# Patient Record
Sex: Female | Born: 1960 | Race: Black or African American | Hispanic: No
Health system: Midwestern US, Community
[De-identification: ages and names within clinical notes are randomized; demographics above are authoritative.]

## PROBLEM LIST (undated history)

## (undated) DIAGNOSIS — Z8719 Personal history of other diseases of the digestive system: Secondary | ICD-10-CM

## (undated) DIAGNOSIS — I50810 Right heart failure, unspecified: Secondary | ICD-10-CM

## (undated) DIAGNOSIS — J849 Interstitial pulmonary disease, unspecified: Secondary | ICD-10-CM

## (undated) DIAGNOSIS — D649 Anemia, unspecified: Secondary | ICD-10-CM

## (undated) DIAGNOSIS — I4581 Long QT syndrome: Secondary | ICD-10-CM

## (undated) DIAGNOSIS — K219 Gastro-esophageal reflux disease without esophagitis: Secondary | ICD-10-CM

## (undated) DIAGNOSIS — G4710 Hypersomnia, unspecified: Secondary | ICD-10-CM

## (undated) DIAGNOSIS — M349 Systemic sclerosis, unspecified: Secondary | ICD-10-CM

## (undated) DIAGNOSIS — I10 Essential (primary) hypertension: Secondary | ICD-10-CM

## (undated) DIAGNOSIS — E669 Obesity, unspecified: Secondary | ICD-10-CM

## (undated) DIAGNOSIS — I2721 Secondary pulmonary arterial hypertension: Secondary | ICD-10-CM

## (undated) DIAGNOSIS — E079 Disorder of thyroid, unspecified: Secondary | ICD-10-CM

## (undated) DIAGNOSIS — J45909 Unspecified asthma, uncomplicated: Secondary | ICD-10-CM

## (undated) DIAGNOSIS — N809 Endometriosis, unspecified: Secondary | ICD-10-CM

## (undated) DIAGNOSIS — R42 Dizziness and giddiness: Secondary | ICD-10-CM

## (undated) DIAGNOSIS — N920 Excessive and frequent menstruation with regular cycle: Secondary | ICD-10-CM

## (undated) DIAGNOSIS — R072 Precordial pain: Secondary | ICD-10-CM

## (undated) DIAGNOSIS — G4730 Sleep apnea, unspecified: Secondary | ICD-10-CM

## (undated) DIAGNOSIS — E119 Type 2 diabetes mellitus without complications: Secondary | ICD-10-CM

## (undated) DIAGNOSIS — E0580 Other thyrotoxicosis without thyrotoxic crisis or storm: Secondary | ICD-10-CM

## (undated) DIAGNOSIS — J42 Unspecified chronic bronchitis: Secondary | ICD-10-CM

## (undated) DIAGNOSIS — Q2112 Patent foramen ovale: Secondary | ICD-10-CM

## (undated) DIAGNOSIS — Z9289 Personal history of other medical treatment: Secondary | ICD-10-CM

## (undated) DIAGNOSIS — J189 Pneumonia, unspecified organism: Secondary | ICD-10-CM

## (undated) DIAGNOSIS — I959 Hypotension, unspecified: Secondary | ICD-10-CM

## (undated) DIAGNOSIS — D259 Leiomyoma of uterus, unspecified: Secondary | ICD-10-CM

## (undated) DIAGNOSIS — H9319 Tinnitus, unspecified ear: Secondary | ICD-10-CM

## (undated) DIAGNOSIS — Z862 Personal history of diseases of the blood and blood-forming organs and certain disorders involving the immune mechanism: Secondary | ICD-10-CM

## (undated) DIAGNOSIS — M359 Systemic involvement of connective tissue, unspecified: Secondary | ICD-10-CM

## (undated) DIAGNOSIS — M1990 Unspecified osteoarthritis, unspecified site: Secondary | ICD-10-CM

## (undated) DIAGNOSIS — T7840XA Allergy, unspecified, initial encounter: Secondary | ICD-10-CM

## (undated) DIAGNOSIS — R6510 Systemic inflammatory response syndrome (SIRS) of non-infectious origin without acute organ dysfunction: Secondary | ICD-10-CM

## (undated) DIAGNOSIS — Q211 Atrial septal defect: Secondary | ICD-10-CM

## (undated) DIAGNOSIS — D126 Benign neoplasm of colon, unspecified: Secondary | ICD-10-CM

## (undated) DIAGNOSIS — M329 Systemic lupus erythematosus, unspecified: Secondary | ICD-10-CM

## (undated) DIAGNOSIS — I509 Heart failure, unspecified: Secondary | ICD-10-CM

## (undated) HISTORY — DX: Essential (primary) hypertension: I10

## (undated) HISTORY — DX: Long QT syndrome: I45.81

## (undated) HISTORY — PX: DILATION AND CURETTAGE OF UTERUS: SHX78

## (undated) HISTORY — DX: Systemic involvement of connective tissue, unspecified: M35.9

## (undated) HISTORY — DX: Excessive and frequent menstruation with regular cycle: N92.0

## (undated) HISTORY — DX: Unspecified osteoarthritis, unspecified site: M19.90

## (undated) HISTORY — DX: Allergy, unspecified, initial encounter: T78.40XA

## (undated) HISTORY — DX: Leiomyoma of uterus, unspecified: D25.9

## (undated) HISTORY — DX: Obesity, unspecified: E66.9

## (undated) HISTORY — PX: REDUCTION MAMMAPLASTY: SUR839

## (undated) HISTORY — DX: Precordial pain: R07.2

## (undated) HISTORY — DX: Systemic inflammatory response syndrome (sirs) of non-infectious origin without acute organ dysfunction: R65.10

## (undated) HISTORY — DX: Disorder of thyroid, unspecified: E07.9

## (undated) HISTORY — DX: Personal history of diseases of the blood and blood-forming organs and certain disorders involving the immune mechanism: Z86.2

## (undated) HISTORY — DX: Sleep apnea, unspecified: G47.10

## (undated) HISTORY — DX: Heart failure, unspecified: I50.9

## (undated) HISTORY — DX: Endometriosis, unspecified: N80.9

## (undated) HISTORY — DX: Unspecified chronic bronchitis: J42

## (undated) HISTORY — DX: Hypersomnia, unspecified: G47.10

## (undated) HISTORY — DX: Systemic lupus erythematosus, unspecified: M32.9

## (undated) HISTORY — DX: Personal history of other medical treatment: Z92.89

## (undated) HISTORY — DX: Hypotension, unspecified: I95.9

## (undated) HISTORY — DX: Other thyrotoxicosis without thyrotoxic crisis or storm: E05.80

## (undated) HISTORY — DX: Benign neoplasm of colon, unspecified: D12.6

## (undated) HISTORY — DX: Sleep apnea, unspecified: G47.30

---

## 1999-02-28 ENCOUNTER — Other Ambulatory Visit: Admission: RE | Admit: 1999-02-28 | Discharge: 1999-02-28 | Payer: Self-pay | Admitting: Obstetrics and Gynecology

## 2000-02-27 ENCOUNTER — Other Ambulatory Visit: Admission: RE | Admit: 2000-02-27 | Discharge: 2000-02-27 | Payer: Self-pay | Admitting: Obstetrics and Gynecology

## 2001-09-02 ENCOUNTER — Other Ambulatory Visit: Admission: RE | Admit: 2001-09-02 | Discharge: 2001-09-02 | Payer: Self-pay | Admitting: *Deleted

## 2002-02-15 ENCOUNTER — Encounter: Payer: Self-pay | Admitting: Emergency Medicine

## 2002-02-15 ENCOUNTER — Emergency Department (HOSPITAL_COMMUNITY): Admission: EM | Admit: 2002-02-15 | Discharge: 2002-02-15 | Payer: Self-pay | Admitting: Emergency Medicine

## 2002-08-13 ENCOUNTER — Encounter: Payer: Self-pay | Admitting: Family Medicine

## 2002-08-13 HISTORY — PX: ABDOMINAL HYSTERECTOMY: SHX81

## 2002-08-13 LAB — CONVERTED CEMR LAB: Pap Smear: NORMAL

## 2002-09-24 ENCOUNTER — Other Ambulatory Visit: Admission: RE | Admit: 2002-09-24 | Discharge: 2002-09-24 | Payer: Self-pay | Admitting: Obstetrics and Gynecology

## 2003-06-03 ENCOUNTER — Encounter: Payer: Self-pay | Admitting: Obstetrics and Gynecology

## 2003-06-07 ENCOUNTER — Inpatient Hospital Stay (HOSPITAL_COMMUNITY): Admission: RE | Admit: 2003-06-07 | Discharge: 2003-06-10 | Payer: Self-pay | Admitting: Obstetrics and Gynecology

## 2003-06-07 ENCOUNTER — Encounter (INDEPENDENT_AMBULATORY_CARE_PROVIDER_SITE_OTHER): Payer: Self-pay | Admitting: *Deleted

## 2003-08-14 HISTORY — PX: BREAST SURGERY: SHX581

## 2003-10-18 ENCOUNTER — Other Ambulatory Visit: Admission: RE | Admit: 2003-10-18 | Discharge: 2003-10-18 | Payer: Self-pay | Admitting: Obstetrics and Gynecology

## 2004-04-09 ENCOUNTER — Emergency Department (HOSPITAL_COMMUNITY): Admission: EM | Admit: 2004-04-09 | Discharge: 2004-04-09 | Payer: Self-pay | Admitting: Emergency Medicine

## 2004-04-10 ENCOUNTER — Ambulatory Visit (HOSPITAL_BASED_OUTPATIENT_CLINIC_OR_DEPARTMENT_OTHER): Admission: RE | Admit: 2004-04-10 | Discharge: 2004-04-10 | Payer: Self-pay | Admitting: Specialist

## 2004-04-10 ENCOUNTER — Ambulatory Visit: Admission: RE | Admit: 2004-04-10 | Discharge: 2004-04-10 | Payer: Self-pay | Admitting: Specialist

## 2004-06-06 ENCOUNTER — Ambulatory Visit (HOSPITAL_COMMUNITY): Admission: RE | Admit: 2004-06-06 | Discharge: 2004-06-06 | Payer: Self-pay | Admitting: Specialist

## 2004-06-19 ENCOUNTER — Ambulatory Visit (HOSPITAL_COMMUNITY): Admission: RE | Admit: 2004-06-19 | Discharge: 2004-06-19 | Payer: Self-pay | Admitting: Specialist

## 2004-06-19 ENCOUNTER — Encounter (INDEPENDENT_AMBULATORY_CARE_PROVIDER_SITE_OTHER): Payer: Self-pay | Admitting: *Deleted

## 2004-06-19 ENCOUNTER — Ambulatory Visit (HOSPITAL_BASED_OUTPATIENT_CLINIC_OR_DEPARTMENT_OTHER): Admission: RE | Admit: 2004-06-19 | Discharge: 2004-06-19 | Payer: Self-pay | Admitting: Specialist

## 2004-07-04 ENCOUNTER — Emergency Department (HOSPITAL_COMMUNITY): Admission: EM | Admit: 2004-07-04 | Discharge: 2004-07-04 | Payer: Self-pay | Admitting: Emergency Medicine

## 2004-07-13 ENCOUNTER — Ambulatory Visit: Payer: Self-pay | Admitting: Family Medicine

## 2004-08-16 ENCOUNTER — Ambulatory Visit: Payer: Self-pay | Admitting: Family Medicine

## 2005-03-14 ENCOUNTER — Ambulatory Visit: Payer: Self-pay | Admitting: Family Medicine

## 2005-03-20 ENCOUNTER — Ambulatory Visit (HOSPITAL_COMMUNITY): Admission: RE | Admit: 2005-03-20 | Discharge: 2005-03-20 | Payer: Self-pay | Admitting: Family Medicine

## 2005-05-21 ENCOUNTER — Ambulatory Visit: Payer: Self-pay | Admitting: Family Medicine

## 2005-06-25 ENCOUNTER — Ambulatory Visit: Payer: Self-pay | Admitting: Family Medicine

## 2005-07-18 ENCOUNTER — Encounter (HOSPITAL_COMMUNITY): Admission: RE | Admit: 2005-07-18 | Discharge: 2005-07-18 | Payer: Self-pay | Admitting: Oncology

## 2005-07-18 ENCOUNTER — Encounter: Admission: RE | Admit: 2005-07-18 | Discharge: 2005-07-18 | Payer: Self-pay | Admitting: Oncology

## 2005-07-18 ENCOUNTER — Ambulatory Visit (HOSPITAL_COMMUNITY): Payer: Self-pay | Admitting: Oncology

## 2005-11-22 ENCOUNTER — Other Ambulatory Visit: Admission: RE | Admit: 2005-11-22 | Discharge: 2005-11-22 | Payer: Self-pay | Admitting: Obstetrics and Gynecology

## 2005-12-18 ENCOUNTER — Ambulatory Visit: Payer: Self-pay | Admitting: Family Medicine

## 2006-01-25 ENCOUNTER — Ambulatory Visit: Payer: Self-pay | Admitting: Family Medicine

## 2006-03-20 ENCOUNTER — Ambulatory Visit: Payer: Self-pay | Admitting: Family Medicine

## 2006-06-05 ENCOUNTER — Other Ambulatory Visit: Admission: RE | Admit: 2006-06-05 | Discharge: 2006-06-05 | Payer: Self-pay | Admitting: Obstetrics and Gynecology

## 2006-07-11 ENCOUNTER — Ambulatory Visit: Payer: Self-pay | Admitting: Family Medicine

## 2006-07-11 LAB — CONVERTED CEMR LAB: Blood Glucose, Fasting: 95 mg/dL

## 2006-09-26 ENCOUNTER — Ambulatory Visit (HOSPITAL_COMMUNITY): Admission: RE | Admit: 2006-09-26 | Discharge: 2006-09-26 | Payer: Self-pay | Admitting: Family Medicine

## 2006-09-26 ENCOUNTER — Ambulatory Visit: Payer: Self-pay | Admitting: Family Medicine

## 2006-09-30 ENCOUNTER — Emergency Department (HOSPITAL_COMMUNITY): Admission: EM | Admit: 2006-09-30 | Discharge: 2006-10-01 | Payer: Self-pay | Admitting: Emergency Medicine

## 2006-10-22 ENCOUNTER — Ambulatory Visit: Payer: Self-pay | Admitting: Family Medicine

## 2007-03-03 ENCOUNTER — Encounter: Payer: Self-pay | Admitting: Family Medicine

## 2007-03-03 LAB — CONVERTED CEMR LAB
BUN: 15 mg/dL (ref 6–23)
Blood Glucose, Fasting: 115 mg/dL
Calcium: 8.5 mg/dL (ref 8.4–10.5)
Chloride: 100 meq/L (ref 96–112)
Hgb A1c MFr Bld: 6.4 %
Hgb A1c MFr Bld: 6.4 % — ABNORMAL HIGH (ref 4.6–6.1)

## 2007-03-19 ENCOUNTER — Ambulatory Visit: Payer: Self-pay | Admitting: Family Medicine

## 2007-03-25 ENCOUNTER — Ambulatory Visit (HOSPITAL_COMMUNITY): Admission: RE | Admit: 2007-03-25 | Discharge: 2007-03-25 | Payer: Self-pay | Admitting: Family Medicine

## 2007-03-26 ENCOUNTER — Ambulatory Visit (HOSPITAL_COMMUNITY): Admission: RE | Admit: 2007-03-26 | Discharge: 2007-03-26 | Payer: Self-pay | Admitting: Family Medicine

## 2007-07-01 ENCOUNTER — Other Ambulatory Visit: Admission: RE | Admit: 2007-07-01 | Discharge: 2007-07-01 | Payer: Self-pay | Admitting: Obstetrics and Gynecology

## 2007-08-27 ENCOUNTER — Encounter: Payer: Self-pay | Admitting: Family Medicine

## 2007-08-27 DIAGNOSIS — I10 Essential (primary) hypertension: Secondary | ICD-10-CM | POA: Insufficient documentation

## 2007-08-27 DIAGNOSIS — E039 Hypothyroidism, unspecified: Secondary | ICD-10-CM | POA: Insufficient documentation

## 2007-08-27 DIAGNOSIS — E669 Obesity, unspecified: Secondary | ICD-10-CM | POA: Insufficient documentation

## 2007-08-27 DIAGNOSIS — E1169 Type 2 diabetes mellitus with other specified complication: Secondary | ICD-10-CM | POA: Insufficient documentation

## 2007-10-16 ENCOUNTER — Ambulatory Visit: Payer: Self-pay | Admitting: Family Medicine

## 2007-10-16 LAB — CONVERTED CEMR LAB
Basophils Relative: 0 % (ref 0–1)
Chloride: 101 meq/L (ref 96–112)
HCT: 34 % — ABNORMAL LOW (ref 36.0–46.0)
HDL: 37 mg/dL — ABNORMAL LOW (ref >39)
Hemoglobin: 10.7 g/dL — ABNORMAL LOW (ref 12.0–15.0)
Lymphocytes Relative: 34 % (ref 12–46)
Lymphs Abs: 3.8 10*3/uL (ref 0.7–4.0)
Monocytes Relative: 6 % (ref 3–12)
Neutro Abs: 6.6 10*3/uL (ref 1.7–7.7)
Platelets: 396 10*3/uL (ref 150–400)
Potassium: 4 meq/L (ref 3.5–5.3)
Total CHOL/HDL Ratio: 3.9
WBC: 11.4 10*3/uL — ABNORMAL HIGH (ref 4.0–10.5)

## 2008-03-05 DIAGNOSIS — Z6841 Body Mass Index (BMI) 40.0 and over, adult: Secondary | ICD-10-CM

## 2008-04-13 ENCOUNTER — Ambulatory Visit (HOSPITAL_COMMUNITY): Admission: RE | Admit: 2008-04-13 | Discharge: 2008-04-13 | Payer: Self-pay | Admitting: Family Medicine

## 2008-04-23 ENCOUNTER — Ambulatory Visit: Payer: Self-pay | Admitting: Family Medicine

## 2008-05-05 ENCOUNTER — Telehealth: Payer: Self-pay | Admitting: Family Medicine

## 2008-05-31 ENCOUNTER — Ambulatory Visit: Payer: Self-pay | Admitting: Family Medicine

## 2008-05-31 DIAGNOSIS — J209 Acute bronchitis, unspecified: Secondary | ICD-10-CM | POA: Insufficient documentation

## 2008-05-31 LAB — CONVERTED CEMR LAB: Glucose, Bld: 141 mg/dL

## 2008-07-20 ENCOUNTER — Other Ambulatory Visit: Admission: RE | Admit: 2008-07-20 | Discharge: 2008-07-20 | Payer: Self-pay | Admitting: Obstetrics and Gynecology

## 2008-08-19 ENCOUNTER — Telehealth: Payer: Self-pay | Admitting: Family Medicine

## 2008-10-06 ENCOUNTER — Ambulatory Visit: Payer: Self-pay | Admitting: Family Medicine

## 2008-10-10 DIAGNOSIS — E559 Vitamin D deficiency, unspecified: Secondary | ICD-10-CM | POA: Insufficient documentation

## 2008-10-15 ENCOUNTER — Encounter: Payer: Self-pay | Admitting: Family Medicine

## 2008-10-18 ENCOUNTER — Ambulatory Visit: Payer: Self-pay | Admitting: Endocrinology

## 2008-10-18 LAB — CONVERTED CEMR LAB
Calcium, Total (PTH): 8.4 mg/dL (ref 8.4–10.5)
Vit D, 25-Hydroxy: 26 ng/mL — ABNORMAL LOW (ref 30–89)

## 2008-10-21 ENCOUNTER — Encounter: Payer: Self-pay | Admitting: Family Medicine

## 2008-10-21 LAB — CONVERTED CEMR LAB
BUN: 23 mg/dL (ref 6–23)
CO2: 26 meq/L (ref 19–32)
Cholesterol: 138 mg/dL (ref 0–200)
Creatinine, Ser: 1.09 mg/dL (ref 0.40–1.20)
Eosinophils Absolute: 0.3 10*3/uL (ref 0.0–0.7)
Glucose, Bld: 88 mg/dL (ref 70–99)
HCT: 34.3 % — ABNORMAL LOW (ref 36.0–46.0)
LDL Cholesterol: 85 mg/dL (ref 0–99)
MCV: 84.9 fL (ref 78.0–100.0)
Monocytes Absolute: 0.6 10*3/uL (ref 0.1–1.0)
Monocytes Relative: 6 % (ref 3–12)
Neutro Abs: 5.8 10*3/uL (ref 1.7–7.7)
RBC: 4.04 M/uL (ref 3.87–5.11)
Sodium: 141 meq/L (ref 135–145)
TSH: 3.035 microintl units/mL (ref 0.350–4.500)
Triglycerides: 89 mg/dL (ref <150)
VLDL: 18 mg/dL (ref 0–40)
WBC: 9.7 10*3/uL (ref 4.0–10.5)

## 2008-10-27 ENCOUNTER — Ambulatory Visit: Payer: Self-pay | Admitting: Family Medicine

## 2008-10-28 ENCOUNTER — Encounter: Payer: Self-pay | Admitting: Family Medicine

## 2008-11-01 ENCOUNTER — Telehealth: Payer: Self-pay | Admitting: Family Medicine

## 2008-11-15 ENCOUNTER — Ambulatory Visit: Payer: Self-pay | Admitting: Family Medicine

## 2008-11-15 DIAGNOSIS — R609 Edema, unspecified: Secondary | ICD-10-CM | POA: Insufficient documentation

## 2008-11-15 DIAGNOSIS — J309 Allergic rhinitis, unspecified: Secondary | ICD-10-CM | POA: Insufficient documentation

## 2008-11-15 DIAGNOSIS — R519 Headache, unspecified: Secondary | ICD-10-CM | POA: Insufficient documentation

## 2008-11-15 DIAGNOSIS — R059 Cough, unspecified: Secondary | ICD-10-CM | POA: Insufficient documentation

## 2008-11-15 DIAGNOSIS — R51 Headache: Secondary | ICD-10-CM | POA: Insufficient documentation

## 2008-11-15 DIAGNOSIS — R05 Cough: Secondary | ICD-10-CM

## 2008-11-15 LAB — CONVERTED CEMR LAB: Hgb A1c MFr Bld: 6 %

## 2008-11-16 ENCOUNTER — Encounter: Payer: Self-pay | Admitting: Family Medicine

## 2008-11-18 ENCOUNTER — Telehealth: Payer: Self-pay | Admitting: Family Medicine

## 2008-11-26 ENCOUNTER — Ambulatory Visit: Payer: Self-pay | Admitting: Cardiology

## 2008-12-02 ENCOUNTER — Ambulatory Visit: Payer: Self-pay | Admitting: Endocrinology

## 2008-12-02 DIAGNOSIS — N2581 Secondary hyperparathyroidism of renal origin: Secondary | ICD-10-CM | POA: Insufficient documentation

## 2008-12-02 LAB — CONVERTED CEMR LAB: PTH: 88.5 pg/mL — ABNORMAL HIGH (ref 14.0–72.0)

## 2008-12-03 ENCOUNTER — Encounter: Payer: Self-pay | Admitting: Family Medicine

## 2008-12-03 ENCOUNTER — Ambulatory Visit (HOSPITAL_COMMUNITY): Admission: RE | Admit: 2008-12-03 | Discharge: 2008-12-03 | Payer: Self-pay | Admitting: Cardiology

## 2008-12-03 ENCOUNTER — Encounter: Payer: Self-pay | Admitting: Cardiology

## 2008-12-03 ENCOUNTER — Ambulatory Visit (HOSPITAL_COMMUNITY): Admission: RE | Admit: 2008-12-03 | Discharge: 2008-12-03 | Payer: Self-pay | Admitting: Family Medicine

## 2008-12-03 ENCOUNTER — Ambulatory Visit: Payer: Self-pay | Admitting: Cardiology

## 2009-02-08 ENCOUNTER — Telehealth: Payer: Self-pay | Admitting: Family Medicine

## 2009-02-15 ENCOUNTER — Ambulatory Visit: Payer: Self-pay | Admitting: Family Medicine

## 2009-02-15 LAB — CONVERTED CEMR LAB: Glucose, Bld: 153 mg/dL

## 2009-02-16 ENCOUNTER — Encounter: Payer: Self-pay | Admitting: Family Medicine

## 2009-02-16 LAB — CONVERTED CEMR LAB
Creatinine, Urine: 89.8 mg/dL
Microalb, Ur: 2.57 mg/dL — ABNORMAL HIGH (ref 0.00–1.89)

## 2009-02-17 ENCOUNTER — Telehealth: Payer: Self-pay | Admitting: Family Medicine

## 2009-05-12 ENCOUNTER — Ambulatory Visit: Payer: Self-pay | Admitting: Family Medicine

## 2009-05-12 LAB — CONVERTED CEMR LAB: Hgb A1c MFr Bld: 6.5 %

## 2009-05-13 ENCOUNTER — Ambulatory Visit (HOSPITAL_COMMUNITY): Admission: RE | Admit: 2009-05-13 | Discharge: 2009-05-13 | Payer: Self-pay | Admitting: Family Medicine

## 2009-06-13 ENCOUNTER — Telehealth: Payer: Self-pay | Admitting: Family Medicine

## 2009-06-27 ENCOUNTER — Telehealth: Payer: Self-pay | Admitting: Family Medicine

## 2009-07-02 LAB — CONVERTED CEMR LAB: AST: 14 units/L (ref 0–37)

## 2009-07-27 ENCOUNTER — Telehealth: Payer: Self-pay | Admitting: Family Medicine

## 2009-08-10 ENCOUNTER — Ambulatory Visit: Payer: Self-pay | Admitting: Family Medicine

## 2009-08-10 DIAGNOSIS — R5381 Other malaise: Secondary | ICD-10-CM | POA: Insufficient documentation

## 2009-08-10 DIAGNOSIS — R5383 Other fatigue: Secondary | ICD-10-CM

## 2009-08-10 LAB — CONVERTED CEMR LAB: Glucose, Bld: 123 mg/dL

## 2009-08-14 DIAGNOSIS — M79609 Pain in unspecified limb: Secondary | ICD-10-CM | POA: Insufficient documentation

## 2009-08-14 DIAGNOSIS — N39498 Other specified urinary incontinence: Secondary | ICD-10-CM | POA: Insufficient documentation

## 2009-09-14 ENCOUNTER — Ambulatory Visit: Payer: Self-pay | Admitting: Family Medicine

## 2009-11-22 ENCOUNTER — Ambulatory Visit: Payer: Self-pay | Admitting: Family Medicine

## 2009-11-23 DIAGNOSIS — H1130 Conjunctival hemorrhage, unspecified eye: Secondary | ICD-10-CM | POA: Insufficient documentation

## 2009-11-29 ENCOUNTER — Encounter: Payer: Self-pay | Admitting: Family Medicine

## 2009-11-29 LAB — CONVERTED CEMR LAB
Free T4: 0.81 ng/dL (ref 0.80–1.80)
T3, Free: 2.4 pg/mL (ref 2.3–4.2)

## 2009-11-30 LAB — CONVERTED CEMR LAB
BUN: 17 mg/dL (ref 6–23)
Basophils Absolute: 0 10*3/uL (ref 0.0–0.1)
Chloride: 100 meq/L (ref 96–112)
Creatinine, Ser: 0.95 mg/dL (ref 0.40–1.20)
Eosinophils Relative: 3 % (ref 0–5)
Glucose, Bld: 109 mg/dL — ABNORMAL HIGH (ref 70–99)
HCT: 35.7 % — ABNORMAL LOW (ref 36.0–46.0)
LDL Cholesterol: 84 mg/dL (ref 0–99)
MCV: 84 fL (ref 78.0–100.0)
Monocytes Absolute: 0.8 10*3/uL (ref 0.1–1.0)
Monocytes Relative: 7 % (ref 3–12)
Neutrophils Relative %: 58 % (ref 43–77)
Platelets: 363 10*3/uL (ref 150–400)
Sodium: 141 meq/L (ref 135–145)
VLDL: 18 mg/dL (ref 0–40)
WBC: 10.7 10*3/uL — ABNORMAL HIGH (ref 4.0–10.5)

## 2009-12-06 ENCOUNTER — Telehealth (INDEPENDENT_AMBULATORY_CARE_PROVIDER_SITE_OTHER): Payer: Self-pay | Admitting: *Deleted

## 2009-12-14 ENCOUNTER — Ambulatory Visit: Payer: Self-pay | Admitting: Endocrinology

## 2010-01-30 ENCOUNTER — Telehealth: Payer: Self-pay | Admitting: Family Medicine

## 2010-05-04 ENCOUNTER — Ambulatory Visit: Payer: Self-pay | Admitting: Family Medicine

## 2010-05-04 ENCOUNTER — Encounter (INDEPENDENT_AMBULATORY_CARE_PROVIDER_SITE_OTHER): Payer: Self-pay

## 2010-05-04 LAB — CONVERTED CEMR LAB
Chloride: 98 meq/L (ref 96–112)
Creatinine, Ser: 1 mg/dL (ref 0.40–1.20)
Potassium: 3.4 meq/L — ABNORMAL LOW (ref 3.5–5.3)
Sodium: 142 meq/L (ref 135–145)

## 2010-05-05 ENCOUNTER — Telehealth: Payer: Self-pay | Admitting: Family Medicine

## 2010-05-05 ENCOUNTER — Encounter: Payer: Self-pay | Admitting: Family Medicine

## 2010-05-05 LAB — CONVERTED CEMR LAB: Microalb, Ur: 4.22 mg/dL — ABNORMAL HIGH (ref 0.00–1.89)

## 2010-05-23 ENCOUNTER — Telehealth: Payer: Self-pay | Admitting: Family Medicine

## 2010-06-22 ENCOUNTER — Ambulatory Visit (HOSPITAL_COMMUNITY): Admission: RE | Admit: 2010-06-22 | Discharge: 2010-06-22 | Payer: Self-pay | Admitting: Family Medicine

## 2010-08-30 ENCOUNTER — Telehealth: Payer: Self-pay | Admitting: Family Medicine

## 2010-09-14 NOTE — Progress Notes (Signed)
  Phone Note From Pharmacy   Caller: [facility]  [address] Summary of Call: janumet not covered alternative? Initial call taken by: Adella Hare [name],  May 23, 2010 8:58 AM  Follow-up for Phone Call        not needed Follow-up by: Syliva Overman MD,  May 23, 2010 12:36 PM

## 2010-09-14 NOTE — Progress Notes (Signed)
Summary: Office Visit needed  Phone Note Outgoing Call   Summary of Call: Left message with pts spouse to have pt return my call. MD states pt is due for an office visit. Initial call taken by: Josph Macho RMA,  December 06, 2009 10:45 AM  Follow-up for Phone Call        Pt has appt May 4 at 8:00 Follow-up by: Josph Macho RMA,  December 07, 2009 11:21 AM

## 2010-09-14 NOTE — Assessment & Plan Note (Signed)
Summary: BAD COLD HARDLY BREATH   Vital Signs:  Patient profile:   50 year old female Menstrual status:  hysterectomy Height:      60 inches Weight:      257 pounds BMI:     50.37 O2 Sat:      93 % Pulse rate:   96 / minute Pulse rhythm:   regular Resp:     16 per minute BP sitting:   130 / 84  (left arm)  Vitals Entered By: Everitt Amber [name] (May 04, 2010 1:47 PM)  Nutrition Counseling: Patient's BMI is greater than 25 and therefore counseled on weight management options. CC: Stopped up, clear mucus since sunday. Cough   Primary Care Provider:  Syliva Overman MD  CC:  Stopped up and clear mucus since sunday. Cough.  History of Present Illness: 5 day h/o head and chest congestion, stuffy nose and bilateral ear pressure. The pt has had no fever but has had chills. She c/o sore throa and poorapetite, also generalised weakness. pt has been eval by endo, and was told all was well. she has discontinued janumet with no polyuria or polydypsia. She has not consitenetly changed her eating and has gained weight.She does not exercise regularly.    Current Medications (verified): 1)  Calcitriol 0.5 Mcg Caps (Calcitriol) .... 2 Qd 2)  Slow Fe 160 (50 Fe) Mg Cr-Tabs (Ferrous Sulfate Dried) .... Take 1 Tablet By Mouth Once A Day 3)  Singulair 10 Mg Tabs (Montelukast Sodium) .... Take 1 Tablet By Mouth Once A Day 4)  Klor-Con M20 20 Meq Cr-Tabs (Potassium Chloride Crys Cr) .... Take 2 Tablets in The Morning and 1 At 2pm 5)  Amlodipine Besylate 5 Mg Tabs (Amlodipine Besylate) .... Take 1 Tablet By Mouth Once A Day 6)  Imipramine Hcl 10 Mg Tabs (Imipramine Hcl) .... Take 1 Tab By Mouth At Bedtime 7)  Enablex 7.5 Mg Xr24h-Tab (Darifenacin Hydrobromide) .... One Tab By Mouth Qd 8)  Phentermine Hcl 37.5 Mg Tabs (Phentermine Hcl) .... Take 1 Tablet By Mouth Once A Day  Allergies (verified): No Known Drug Allergies  Review of Systems      See HPI General:  Complains of fatigue, loss  of appetite, malaise, and sleep disorder. Eyes:  Denies blurring and discharge. ENT:  Complains of hoarseness, nasal congestion, postnasal drainage, sinus pressure, and sore throat. CV:  Denies chest pain or discomfort, palpitations, and swelling of feet. Resp:  Complains of cough, shortness of breath, and wheezing; denies sputum productive. GI:  Denies abdominal pain, bloody stools, diarrhea, nausea, and vomiting. GU:  Denies dysuria and urinary frequency. MS:  Denies joint pain and stiffness. Derm:  Denies itching and rash. Neuro:  Complains of headaches; denies poor balance, seizures, and sensation of room spinning. Psych:  Denies anxiety and depression. Endo:  Denies excessive thirst and excessive urination. Heme:  Denies abnormal bruising and bleeding. Allergy:  Complains of seasonal allergies; denies hives or rash and itching eyes.  Physical Exam  General:  Well-developed,obese,in no acute distress; alert,ill appearing, nasal congestionevident alot of mouth breathing. HEENT: No facial asymmetry,  EOMI, positive  sinus tenderness, TM's Clear, oropharynx  erythematous and moist. positive erythemea and edema of nasal mucosa  Chest: decreased air entry , bilateral wheezes CVS: S1, S2, No murmurs, No S3.   Abd: Soft, Nontender.  MS: Adequate ROM spine, hips, shoulders and knees.  Ext: No edema.   CNS: CN 2-12 intact, power tone and sensation normal throughout.   Skin:  Intact, no visible lesions or rashes.  Psych: Good eye contact, normal affect.  Memory intact, not anxious or depressed appearing.     Impression & Recommendations:  Problem # 1:  ALLERGIC RHINITIS CAUSE UNSPECIFIED (ICD-477.9) Assessment Deteriorated  Problem # 2:  DIABETES MELLITUS, TYPE II (ICD-250.00) Assessment: Comment Only  Orders: T-Urine Microalbumin w/creat. ratio [id number]) T- Hemoglobin A1C [id number])  Labs Reviewed: Creat: 0.95 (11/25/2009)    Reviewed HgBA1c results: 6.9  (11/25/2009)  6.4 (08/10/2009)  The following medications were removed from the medication list:    Janumet 50-1000 Mg Tabs (Sitagliptin-metformin hcl) .[name]... Take 1 tablet by mouth two times a day Her updated medication list for this problem includes:    Glucophage Xr 750 Mg Xr24h-tab (Metformin hcl) .[name]... Take 1 tablet by mouth once a day  Problem # 3:  ACUTE BRONCHITIS (ICD-466.0) Assessment: Comment Only  Her updated medication list for this problem includes:    Singulair 10 Mg Tabs (Montelukast sodium) .[name]... Take 1 tablet by mouth once a day    Penicillin V Potassium 500 Mg Tabs (Penicillin v potassium) .[name]... Take 1 tablet by mouth three times a day  Orders: Depo- Medrol 80mg  (J1040) Rocephin  250mg  (G4010) Admin of Therapeutic Inj  intramuscular or subcutaneous (27253) Albuterol Sulfate Sol 1mg  unit dose (G6440) Atrovent 1mg  (Neb) (H4742) Nebulizer Tx (59563)  Problem # 4:  OBESITY (ICD-278.00) Assessment: Deteriorated  Ht: 60 (05/04/2010)   Wt: 257 (05/04/2010)   BMI: 50.37 (05/04/2010) therapeutic lifestyle change discussed and encouraged  Pt to resume phentermine  Problem # 5:  HYPERTENSION (ICD-401.9) Assessment: Comment Only  Her updated medication list for this problem includes:    Amlodipine Besylate 5 Mg Tabs (Amlodipine besylate) .[name]... Take 1 tablet by mouth once a day  BP today: 130/84 Prior BP: 108/78 (12/14/2009)  Labs Reviewed: K+: 3.5 (11/25/2009) Creat: : 0.95 (11/25/2009)   Chol: 145 (11/25/2009)   HDL: 43 (11/25/2009)   LDL: 84 (11/25/2009)   TG: 92 (11/25/2009)  Complete Medication List: 1)  Calcitriol 0.5 Mcg Caps (Calcitriol) .... 2 qd 2)  Slow Fe 160 (50 Fe) Mg Cr-tabs (Ferrous sulfate dried) .... Take 1 tablet by mouth once a day 3)  Singulair 10 Mg Tabs (Montelukast sodium) .... Take 1 tablet by mouth once a day 4)  Klor-con M20 20 Meq Cr-tabs (Potassium chloride crys cr) .... Take 2 tablets in the morning and 1 at 2pm 5)  Amlodipine  Besylate 5 Mg Tabs (Amlodipine besylate) .... Take 1 tablet by mouth once a day 6)  Imipramine Hcl 10 Mg Tabs (Imipramine hcl) .... Take 1 tab by mouth at bedtime 7)  Enablex 7.5 Mg Xr24h-tab (Darifenacin hydrobromide) .... One tab by mouth qd 8)  Phentermine Hcl 37.5 Mg Tabs (Phentermine hcl) .... Take 1 tablet by mouth once a day 9)  Prednisone (pak) 5 Mg Tabs (Prednisone) .... Uad x 6 days 10)  Penicillin V Potassium 500 Mg Tabs (Penicillin v potassium) .... Take 1 tablet by mouth three times a day 11)  Glucophage Xr 750 Mg Xr24h-tab (Metformin hcl) .... Take 1 tablet by mouth once a day  Other Orders: T-Basic Metabolic Panel [phone])  Patient Instructions: 1)  Please schedule a follow-up appointment in 3.5 months. 2)  You are being treated for sinuitis, bronchitis and cervical adenitis (left). 3)  Pls take all medsa s directed. 4)  Pls call if no better. 5)  REST!!! 6)  BMP prior to visit, ICD-9:   today 7)  HbgA1C prior  to visit, ICD-9: 8)  hba1c and chem 7 in 3.5 months also 9)  Work excuse for today 10)  Urine Microalbumin prior to visit, ICD-9: 11)  It is important that you exercise regularly at least 20 minutes 5 times a week. If you develop chest pain, have severe difficulty breathing, or feel very tired , stop exercising immediately and seek medical attention. 12)  You need to lose weight. Consider a lower calorie diet and regular exercise.  13)  Check your blood sugars regularly. If your readings are usually above : or below 70 you should contact our office. Prescriptions: GLUCOPHAGE XR 750 MG XR24H-TAB (METFORMIN HCL) Take 1 tablet by mouth once a day  #30 x 4   Entered and Authorized by:   Syliva Overman MD   Signed by:   Syliva Overman MD on 05/05/2010   Method used:   Electronically to        [facility]  [address] (retail)       [address]       [place], [place]  [address]       Ph: [phone]       Fax: [phone]   RxID:    [id number] PENICILLIN V POTASSIUM 500 MG TABS (PENICILLIN V POTASSIUM) Take 1 tablet by mouth three times a day  #30 x 0   Entered by:   Everitt Amber [name]   Authorized by:   Syliva Overman MD   Signed by:   Everitt Amber [name] on 42/59/5638   Method used:   Handwritten   RxID:   [id number] PREDNISONE (PAK) 5 MG TABS (PREDNISONE) UAD x 6 days  #1 x 0   Entered by:   Everitt Amber [name]   Authorized by:   Syliva Overman MD   Signed by:   Everitt Amber [name] on 02/10/1600   Method used:   Handwritten   RxID:   [id number] JANUMET 50-1000 MG TABS (SITAGLIPTIN-METFORMIN HCL) Take 1 tablet by mouth two times a day  #60 x 4   Entered by:   Everitt Amber [name]   Authorized by:   Syliva Overman MD   Signed by:   Everitt Amber [name] on 70/62/3762   Method used:   Handwritten   RxID:   [id number] PHENTERMINE HCL 37.5 MG TABS (PHENTERMINE HCL) Take 1 tablet by mouth once a day  #30 x 1   Entered by:   Everitt Amber [name]   Authorized by:   Syliva Overman MD   Signed by:   Everitt Amber [name] on 26/94/8546   Method used:   Printed then faxed to ...       [facility]  [address] [facility] (retail)       [address]       [place], [place]  [address]       Ph: [phone]       Fax: [phone]   RxID:   [id number]     Medication Administration  Injection # 1:    Medication: Depo- Medrol 80mg     Diagnosis: ACUTE BRONCHITIS (ICD-466.0)    Route: IM    Site: RUOQ gluteus    Exp Date: 12/2010    Lot #: [id number]    Mfr: Pharmacia    Comments: 80mg  given     Patient tolerated injection without complications    Given by: Everitt Amber [name] (May 04, 2010 3:00 PM)  Injection # 2:    Medication: Rocephin  250mg     Diagnosis: ACUTE BRONCHITIS (ICD-466.0)    Route: IM    Site: RUOQ gluteus    Exp Date: 08/2012    Lot #: [id number]    Mfr: novaplus    Comments: 500mg  given     Patient tolerated injection without complications    Given by: Everitt Amber [name] (May 04, 2010 3:00  PM)  Medication # 1:    Medication: Albuterol Sulfate Sol 1mg  unit dose    Diagnosis: ACUTE BRONCHITIS (ICD-466.0)    Dose: 2.5/39ml    Route: inhaled    Exp Date: 8/12    Lot #: [id number]    Mfr: nephron    Patient tolerated medication without complications    Given by: Everitt Amber [name] (May 05, 2010 8:09 AM)  Medication # 2:    Medication: Atrovent 1mg  (Neb)    Diagnosis: ACUTE BRONCHITIS (ICD-466.0)    Dose: 0.5/2.41ml    Route: inhaled    Exp Date: 10/12    Lot #: [id number]    Mfr: nephron    Patient tolerated medication without complications    Given by: Everitt Amber [name] (May 05, 2010 8:10 AM)  Orders Added: 1)  T-Urine Microalbumin w/creat. ratio [[id number]] 2)  Est. Patient Level IV [04540] 3)  T-Basic Metabolic Panel [[id number]] 4)  T- Hemoglobin A1C [83036-23375] 5)  Depo- Medrol 80mg  [J1040] 6)  Rocephin  250mg  [J0696] 7)  Admin of Therapeutic Inj  intramuscular or subcutaneous [96372] 8)  Albuterol Sulfate Sol 1mg  unit dose [J7613] 9)  Atrovent 1mg  (Neb) [J8119] 10)  Nebulizer Tx [14782]

## 2010-09-14 NOTE — Progress Notes (Signed)
  Phone Note From Pharmacy   Caller: [facility]  [address] Summary of Call: janumet 50/1000 requires pa  substitute? Initial call taken by: Adella Hare [name],  May 05, 2010 11:54 AM  Follow-up for Phone Call        pls let her know she is changed to once daily glucophage , and the script has been sent in Follow-up by: Syliva Overman MD,  May 05, 2010 12:14 PM  Additional Follow-up for Phone Call Additional follow up Details #1::        patient aware Additional Follow-up by: Lind Guest,  May 05, 2010 1:06 PM

## 2010-09-14 NOTE — Letter (Signed)
Summary: Work Excuse  [facility]  [address]   [place], [place] [address]   Phone: [phone]  Fax: [phone]    Today's Date: May 04, 2010  Name of Patient: Sabrina [name]  The above named patient had a medical visit today at: 1:15pm.  Please take this into consideration when reviewing the time away from work/school.    Special Instructions:  [* ] None  [  ] To be off the remainder of today, returning to the normal work / school schedule tomorrow.  [  ] To be off until the next scheduled appointment on ______________________.  [  ] Other ________________________________________________________________ ________________________________________________________________________   Sincerely yours,  Milus Mallick. Lodema Hong, M.D.

## 2010-09-14 NOTE — Progress Notes (Signed)
  Phone Note From Pharmacy   Caller: [facility]  [id number] Summary of Call: requesting refill on janumet 50/1000, not on med list but was filled last month  Initial call taken by: Adella Hare [name],  May 23, 2010 8:46 AM  Follow-up for Phone Call        deny, on glucophage xr only Follow-up by: Syliva Overman MD,  May 23, 2010 12:35 PM

## 2010-09-14 NOTE — Assessment & Plan Note (Signed)
Summary: BP CK  Nurse Visit   Vital Signs:  Patient profile:   50 year old female Menstrual status:  hysterectomy Height:      60 inches Weight:      252 pounds BMI:     49.39 O2 Sat:      95 % Pulse rate:   98 / minute Resp:     16 per minute BP sitting:   108 / 60 Cuff size:   large  Vitals Entered By: Everitt Amber (September 14, 2009 2:43 PM) CC: BP check    Allergies: No Known Drug Allergies  Orders Added: 1)  No Charge Patient Arrived (NCPA0) [NCPA0]

## 2010-09-14 NOTE — Assessment & Plan Note (Signed)
Summary: FU--D/T---STC   Vital Signs:  Patient profile:   50 year old female Menstrual status:  hysterectomy Height:      60 inches Weight:      256 pounds BMI:     50.18 O2 Sat:      93 % on Room air Temp:     97.0 degrees F oral Pulse rate:   86 / minute BP sitting:   108 / 78  (left arm) Cuff size:   large  Vitals Entered By: Ami Bullins CMA (Dec 14, 2009 8:16 AM)  O2 Flow:  Room air CC: pt here for follow up office visit she states she checks CBGs once a week with readings in the low to mid 100's/ ab   Referring Provider:  DR. Lodema Hong Primary Provider:  Syliva Overman MD  CC:  pt here for follow up office visit she states she checks CBGs once a week with readings in the low to mid 100's/ ab.  History of Present Illness: pt says she takes the rocaltrol only internittently.  she has fatigue, and slight numbness of the fingers.  no diarrhea or new fracture.  Current Medications (verified): 1)  Calcitriol 0.5 Mcg Caps (Calcitriol) .... 2 Qd 2)  Slow Fe 160 (50 Fe) Mg Cr-Tabs (Ferrous Sulfate Dried) .... Take 1 Tablet By Mouth Once A Day 3)  Singulair 10 Mg Tabs (Montelukast Sodium) .... Take 1 Tablet By Mouth Once A Day 4)  Klor-Con M20 20 Meq Cr-Tabs (Potassium Chloride Crys Cr) .... Take 2 Tablets in The Morning and 1 At 2pm 5)  Amlodipine Besylate 5 Mg Tabs (Amlodipine Besylate) .... Take 1 Tablet By Mouth Once A Day 6)  Imipramine Hcl 10 Mg Tabs (Imipramine Hcl) .... Take 1 Tab By Mouth At Bedtime 7)  Enablex 7.5 Mg Xr24h-Tab (Darifenacin Hydrobromide) .... One Tab By Mouth Qd 8)  Phentermine Hcl 37.5 Mg Tabs (Phentermine Hcl) .... Take 1 Tablet By Mouth Once A Day  Allergies (verified): No Known Drug Allergies  Past History:  Past Medical History: Last updated: 08/27/2007   HYPERPARATHYROIDISM, HX OF (ICD-V12.2) OBESITY (ICD-278.00) B12 DEFICIENCY (ICD-266.2) HYPOTHYROIDISM (ICD-244.9) HYPERTENSION (ICD-401.9) DIABETES MELLITUS, TYPE II  (ICD-250.00)  Review of Systems       denies muscle cramps.    Physical Exam  General:  morbidly obese.   Neck:  Supple without thyroid enlargement or tenderness.  Msk:  no deformity. 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  i'll do the best i can.  Other Orders: Est. Patient Level III (40981)  Patient Instructions: 1)  resume calcitriol, 2x0.5 micrograms/day.  i sent a new prescription to your pharmacy. 2)  return 6 weeks. 3)  for your safety, it is important to always take medications as prescribed. Prescriptions: CALCITRIOL 0.5 MCG CAPS (CALCITRIOL) 2 qd  #60 x 11   Entered and Authorized by:   Minus Breeding MD   Signed by:   Minus Breeding MD on 12/14/2009   Method used:   Electronically to        [facility]  [address] [facility]       [address]       [place], [place]  [address]       Ph: [phone]       Fax: [phone]   RxID:   [id number]

## 2010-09-14 NOTE — Assessment & Plan Note (Signed)
Summary: office visit   Vital Signs:  Patient profile:   50 year old female Menstrual status:  hysterectomy Height:      60 inches Weight:      253 pounds BMI:     49.59 O2 Sat:      94 % Pulse rate:   101 / minute Pulse rhythm:   regular Resp:     16 per minute BP sitting:   118 / 84  (left arm) Cuff size:   large  Vitals Entered By: Everitt Amber [name] (November 22, 2009 10:23 AM)  Nutrition Counseling: Patient's BMI is greater than 25 and therefore counseled on weight management options. CC: vessel burst in right eye, no pain   Primary Care Provider:  Syliva Overman MD  CC:  vessel burst in right eye and no pain.  History of Present Illness: Reports  that she has been doing well. she recently had a bllood vessel to buirstin her left eye which was not associated with direct eye trauma. she denies change in vision or draining. she is frustrated by her inability to lose weight. She does report inconsistency, both in taking medication, dietary change and exercise. Denies recent fever or chills. Denies sinus pressure, nasal congestion , ear pain or sore throat. Denies chest congestion, or cough productive of sputum. Denies chest pain, palpitations, PND, orthopnea or leg swelling. Denies abdominal pain, nausea, vomitting, diarrhea or constipation. Denies change in bowel movements or bloody stool. Denies dysuria , frequency, incontinence or hesitancy. Denies  joint pain, swelling, or reduced mobility. Denies headaches, vertigo, seizures. Denies depression, anxiety or insomnia. Denies  rash, lesions, or itch.     Current Medications (verified): 1)  Calcitriol 0.5 Mcg Caps (Calcitriol) .... 2 Qd 2)  Slow Fe 160 (50 Fe) Mg Cr-Tabs (Ferrous Sulfate Dried) .... Take 1 Tablet By Mouth Once A Day 3)  Singulair 10 Mg Tabs (Montelukast Sodium) .... Take 1 Tablet By Mouth Once A Day 4)  Klor-Con M20 20 Meq Cr-Tabs (Potassium Chloride Crys Cr) .... Take 2 Tablets in The Morning and 1 At  2pm 5)  Amlodipine Besylate 5 Mg Tabs (Amlodipine Besylate) .... Take 1 Tablet By Mouth Once A Day 6)  Imipramine Hcl 10 Mg Tabs (Imipramine Hcl) .... Take 1 Tab By Mouth At Bedtime 7)  Enablex 7.5 Mg Xr24h-Tab (Darifenacin Hydrobromide) .... One Tab By Mouth Qd  Allergies (verified): No Known Drug Allergies  Review of Systems      See HPI General:  Complains of fatigue. Eyes:  bloody spot behind right eye x 4 days. Endo:  Denies cold intolerance, excessive hunger, excessive thirst, excessive urination, heat intolerance, polyuria, and weight change; tests blood sugars on avg twice weekly. Heme:  Denies abnormal bruising and bleeding. Allergy:  Complains of seasonal allergies.  Physical Exam  General:  Well-developedobesein no acute distress; alert,appropriate and cooperative throughout examination HEENT: No facial asymmetry,  EOMI, No sinus tenderness, TM's Clear, oropharynx  pink and moist. subconjunctival hemmorhage in right eye   Chest: Clear to auscultation bilaterally.  CVS: S1, S2, No murmurs, No S3.   Abd: Soft, Nontender.  MS: Adequate ROM spine, hips, shoulders and knees.  Ext: No edema.   CNS: CN 2-12 intact, power tone and sensation normal throughout.   Skin: Intact, no visible lesions or rashes.  Psych: Good eye contact, normal affect.  Memory intact, not anxious or depressed appearing.    Impression & Recommendations:  Problem # 1:  OBESITY (ICD-278.00) Assessment Unchanged  Ht:  60 (11/22/2009)   Wt: 253 (11/22/2009)   BMI: 49.59 (11/22/2009)  Problem # 2:  DIABETES MELLITUS, TYPE II (ICD-250.00) Assessment: Comment Only  Orders: T- Hemoglobin A1C ([id number])  Labs Reviewed: Creat: 1.09 (10/15/2008)    Reviewed HgBA1c results: 6.4 (08/10/2009)  6.5 (05/12/2009)  Problem # 3:  HYPERTENSION (ICD-401.9) Assessment: Unchanged  Her updated medication list for this problem includes:    Amlodipine Besylate 5 Mg Tabs (Amlodipine besylate) .[name]... Take 1  tablet by mouth once a day  BP today: 118/84 Prior BP: 108/60 (09/14/2009)  Labs Reviewed: K+: 3.9 (10/15/2008) Creat: : 1.09 (10/15/2008)   Chol: 138 (10/15/2008)   HDL: 35 (10/15/2008)   LDL: 85 (10/15/2008)   TG: 89 (10/15/2008)  Problem # 4:  SUBCONJUNCTIVAL HEMORRHAGE, RIGHT (ICD-372.72) Assessment: Comment Only pt reassured of benign nature of the problem, advised if pain or loss of vision occured she should go to the Ed or opthalmologist  Complete Medication List: 1)  Calcitriol 0.5 Mcg Caps (Calcitriol) .... 2 qd 2)  Slow Fe 160 (50 Fe) Mg Cr-tabs (Ferrous sulfate dried) .... Take 1 tablet by mouth once a day 3)  Singulair 10 Mg Tabs (Montelukast sodium) .... Take 1 tablet by mouth once a day 4)  Klor-con M20 20 Meq Cr-tabs (Potassium chloride crys cr) .... Take 2 tablets in the morning and 1 at 2pm 5)  Amlodipine Besylate 5 Mg Tabs (Amlodipine besylate) .... Take 1 tablet by mouth once a day 6)  Imipramine Hcl 10 Mg Tabs (Imipramine hcl) .... Take 1 tab by mouth at bedtime 7)  Enablex 7.5 Mg Xr24h-tab (Darifenacin hydrobromide) .... One tab by mouth qd 8)  Phentermine Hcl 37.5 Mg Tabs (Phentermine hcl) .... Take 1 tablet by mouth once a day  Other Orders: T-Basic Metabolic Panel [id number]) T-Lipid Profile [id number]) T-TSH [id number]) T-CBC w/Diff [phone])  Patient Instructions: 1)  Please schedule a follow-up appointment in 2 months. 2)  It is important that you exercise regularly at least 30 minutes 5 times a week. If you develop chest pain, have severe difficulty breathing, or feel very tired , stop exercising immediately and seek medical attention. 3)  You need to lose weight. Consider a lower calorie diet and regular exercise.  4)  start phentermine daily 5)  BMP prior to visit, ICD-9: 6)  Lipid Panel prior to visit, ICD-9: 7)  TSH prior to visit, ICD-9:   fasting asap 8)  CBC w/ Diff prior to visit, ICD-9: 9)  HbgA1C prior to visit,  ICD-9: Prescriptions: PHENTERMINE HCL 37.5 MG TABS (PHENTERMINE HCL) Take 1 tablet by mouth once a day  #30 x 1   Entered and Authorized by:   Syliva Overman MD   Signed by:   Syliva Overman MD on 11/22/2009   Method used:   Printed then faxed to ...       [facility]  [address] [facility]ail[facility]       [address]       [place], [place]  [address]       Ph: [phone]       Fax: [phone]   RxID:   [id number]

## 2010-09-14 NOTE — Progress Notes (Signed)
Summary: please advise  Phone Note Call from Patient   Summary of Call: patient called in and states she has a dry cough and wanted to know if something could be called in for her.  She would like pill form not cough syrup.  [facility] in [place]   Follow-up for Phone Call        advise pt decongestant sent in, drink alot of fluids and increase vit C , eg oranges. If dev fever, chills , green sputum etc will need ov  or urgent care eval, she should call us back Follow-up by: Syliva Overman MD,  August 30, 2010 5:09 PM  Additional Follow-up for Phone Call Additional follow up Details #1::        called patient, left message Additional Follow-up by: Adella Hare [name],  August 30, 2010 5:09 PM    Additional Follow-up for Phone Call Additional follow up Details #2::    called patient, left message Follow-up by: Adella Hare [name],  August 31, 2010 11:30 AM  Additional Follow-up for Phone Call Additional follow up Details #3:: Details for Additional Follow-up Action Taken: Patient aware. Advised she could also try Robitussin  Additional Follow-up by: Everitt Amber [name],  September 01, 2010 8:51 AM  New/Updated Medications: TESSALON PERLES 100 MG CAPS (BENZONATATE) Take 1 capsule by mouth three times a day Prescriptions: TESSALON PERLES 100 MG CAPS (BENZONATATE) Take 1 capsule by mouth three times a day  #30 x 0   Entered and Authorized by:   Syliva Overman MD   Signed by:   Syliva Overman MD on 08/30/2010   Method used:   Electronically to        [facility]  [address] (retail)       [address]       [place], [place]  [address]       Ph: [phone]       Fax: [phone]   RxID:   [id number]

## 2010-09-14 NOTE — Progress Notes (Signed)
Summary: bp meds  Phone Note Call from Patient   Summary of Call: [facility] [place]  [phone]   call back when done Initial call taken by: Lind Guest,  January 30, 2010 9:28 AM  Follow-up for Phone Call        Phone Call Completed, Rx Called In Follow-up by: Adella Hare [name],  January 30, 2010 9:48 AM    Prescriptions: AMLODIPINE BESYLATE 5 MG TABS (AMLODIPINE BESYLATE) Take 1 tablet by mouth once a day  #30 x 3   Entered by:   Adella Hare [name]   Authorized by:   Syliva Overman MD   Signed by:   Adella Hare [name] on 11/91/4782   Method used:   Electronically to        [facility]  [address] (retail)       [address]       [place], [place]  [address]       Ph: [phone]       Fax: [phone]   RxID:   [id number]

## 2010-09-19 ENCOUNTER — Telehealth: Payer: Self-pay | Admitting: Family Medicine

## 2010-09-28 NOTE — Progress Notes (Signed)
Summary: refill  Phone Note Call from Patient   Summary of Call: needs a new rx for metformin. [facility] [phone] Initial call taken by: Rudene Anda,  September 19, 2010 11:20 AM    Prescriptions: GLUCOPHAGE XR 750 MG XR24H-TAB (METFORMIN HCL) Take 1 tablet by mouth once a day  #30 x 0   Entered by:   Adella Hare [name]   Authorized by:   Syliva Overman MD   Signed by:   Adella Hare [name] on 45/40/9811   Method used:   Electronically to        [facility]  [address] (retail)       [address]       [place], [place]  [address]       Ph: [phone]       Fax: [phone]   RxID:   [id number]

## 2010-10-19 ENCOUNTER — Ambulatory Visit: Payer: Self-pay | Admitting: Family Medicine

## 2010-10-26 ENCOUNTER — Ambulatory Visit (INDEPENDENT_AMBULATORY_CARE_PROVIDER_SITE_OTHER): Payer: PRIVATE HEALTH INSURANCE | Admitting: Family Medicine

## 2010-10-26 ENCOUNTER — Encounter: Payer: Self-pay | Admitting: Family Medicine

## 2010-10-26 DIAGNOSIS — E039 Hypothyroidism, unspecified: Secondary | ICD-10-CM

## 2010-10-26 DIAGNOSIS — G4730 Sleep apnea, unspecified: Secondary | ICD-10-CM

## 2010-10-26 DIAGNOSIS — R5382 Chronic fatigue, unspecified: Secondary | ICD-10-CM | POA: Insufficient documentation

## 2010-10-26 DIAGNOSIS — I10 Essential (primary) hypertension: Secondary | ICD-10-CM

## 2010-10-26 DIAGNOSIS — G4710 Hypersomnia, unspecified: Secondary | ICD-10-CM

## 2010-10-27 ENCOUNTER — Encounter: Payer: Self-pay | Admitting: Family Medicine

## 2010-10-27 LAB — CONVERTED CEMR LAB
BUN: 18 mg/dL (ref 6–23)
CO2: 30 meq/L (ref 19–32)
Glucose, Bld: 96 mg/dL (ref 70–99)
HCT: 36.7 % (ref 36.0–46.0)
HDL: 43 mg/dL (ref >39)
Lymphs Abs: 3.5 10*3/uL (ref 0.7–4.0)
MCV: 82.8 fL (ref 78.0–100.0)
Monocytes Absolute: 0.9 10*3/uL (ref 0.1–1.0)
Monocytes Relative: 8 % (ref 3–12)
RDW: 14.4 % (ref 11.5–15.5)
Sodium: 140 meq/L (ref 135–145)
Triglycerides: 104 mg/dL (ref <150)
VLDL: 21 mg/dL (ref 0–40)
WBC: 10.7 10*3/uL — ABNORMAL HIGH (ref 4.0–10.5)

## 2010-10-31 ENCOUNTER — Encounter: Payer: Self-pay | Admitting: Family Medicine

## 2010-11-09 NOTE — Letter (Signed)
Summary: nutritional care  nutritional care   Imported By: Lind Guest 10/31/2010 11:29:37  _____________________________________________________________________  External Attachment:    Type:   Image     Comment:   External Document

## 2010-11-09 NOTE — Letter (Signed)
Summary: lab add on  lab add on   Imported By: Luann Bullins 10/31/2010 11:31:15  _____________________________________________________________________  External Attachment:    Type:   Image     Comment:   External Document

## 2010-11-09 NOTE — Assessment & Plan Note (Signed)
Summary: 3 month f up   Vital Signs:  Patient profile:   50 year old female Menstrual status:  hysterectomy Height:      60 inches Weight:      252.25 pounds BMI:     49.44 O2 Sat:      96 % on Room air Pulse rate:   91 / minute Pulse rhythm:   regular Resp:     16 per minute BP sitting:   120 / 70  (left arm)  Vitals Entered By: Adella Hare [name] (October 26, 2010 8:55 AM)  Nutrition Counseling: Patient's BMI is greater than 25 and therefore counseled on weight management options.  O2 Flow:  Room air CC: follow-up visit Is Patient Diabetic? Yes   Primary Care Provider:  Syliva Overman MD  CC:  follow-up visit.  History of Present Illness: Reports  that she feels well except for the fact that she continues to have significant difficulty losing weight, she is now requesting a nutrition referral.    Denies recent fever or chills. Denies sinus pressure, nasal congestion , ear pain or sore throat.Reports snoring, fatigue and excessive daytime sleepiness. Denies chest congestion, or cough productive of sputum. Denies chest pain, palpitations, PND, orthopnea or leg swelling. Denies abdominal pain, nausea, vomitting, diarrhea or constipation. Denies change in bowel movements or bloody stool. Denies dysuria , frequency, incontinence or hesitancy. Denies  joint pain, swelling, or reduced mobility. Denies headaches, vertigo, seizures. Denies depression, anxiety or insomnia.      Current Medications (verified): 1)  Calcitriol 0.5 Mcg Caps (Calcitriol) .... 2 Qd 2)  Singulair 10 Mg Tabs (Montelukast Sodium) .... Take 1 Tablet By Mouth Once A Day 3)  Klor-Con M20 20 Meq Cr-Tabs (Potassium Chloride Crys Cr) .... Take 2 Tablets in The Morning and 1 At 2pm 4)  Amlodipine Besylate 5 Mg Tabs (Amlodipine Besylate) .... Take 1 Tablet By Mouth Once A Day 5)  Phentermine Hcl 37.5 Mg Tabs (Phentermine Hcl) .... Take 1 Tablet By Mouth Once A Day 6)  Glucophage Xr 750 Mg Xr24h-Tab  (Metformin Hcl) .... Take 1 Tablet By Mouth Once A Day  Allergies (verified): No Known Drug Allergies  Review of Systems      See HPI General:  Complains of fatigue and sleep disorder; excessive snoring, daytime sleepiness, awakens herself with snoring, h/op stopping breathing . Eyes:  Denies blurring, discharge, and eye pain. Derm:  Complains of lesion(s); bruise on right leg  x 1 year, no change in size , irreg border and discolored. Endo:  Denies cold intolerance, excessive hunger, excessive thirst, excessive urination, and heat intolerance. Heme:  Denies abnormal bruising, bleeding, enlarge lymph nodes, and fevers. Allergy:  Denies hives or rash, itching eyes, persistent infections, and seasonal allergies.  Physical Exam  General:  Well-developed,obese,in no acute distress; alert,ill appearing, nasal congestionevident alot of mouth breathing. HEENT: No facial asymmetry,  EOMI, positive  sinus tenderness, TM's Clear, oropharynx  erythematous and moist. positive erythemea and edema of nasal mucosa  Chest: decreased air entry , bilateral wheezes CVS: S1, S2, No murmurs, No S3.   Abd: Soft, Nontender.  MS: Adequate ROM spine, hips, shoulders and knees.  Ext: No edema.   CNS: CN 2-12 intact, power tone and sensation normal throughout.   Skin: Intact,hyperpigmented macular lesion onright leg Psych: Good eye contact, normal affect.  Memory intact, not anxious or depressed appearing.    Diabetes Management Exam:    Foot Exam (with socks and/or shoes not present):  Sensory-Monofilament:          Left foot: normal          Right foot: normal       Inspection:          Left foot: normal          Right foot: normal       Nails:          Left foot: normal          Right foot: normal   Impression & Recommendations:  Problem # 1:  HYPERSOMNIA, ASSOCIATED WITH SLEEP APNEA (ICD-780.53) Assessment Comment Only  Orders: Neurology Referral (Neuro) Sleep Disorder Referral (Sleep  Disorder)  Problem # 2:  FATIGUE (ICD-780.79) Assessment: Deteriorated  Orders: T-CBC w/Diff ([id number])  Problem # 3:  OBESITY (ICD-278.00) Assessment: Unchanged  Ht: 60 (10/26/2010)   Wt: 252.25 (10/26/2010)   BMI: 49.44 (10/26/2010) therapeutic lifestyle change discussed and encouraged  Problem # 4:  HYPERTENSION (ICD-401.9) Assessment: Improved  Her updated medication list for this problem includes:    Amlodipine Besylate 5 Mg Tabs (Amlodipine besylate) .[name]... Take 1 tablet by mouth once a day  Orders: T-Basic Metabolic Panel [id number]) T-Basic Metabolic Panel ([id number])  BP today: 120/70 Prior BP: 130/84 (05/04/2010)  Labs Reviewed: K+: 3.4 (05/04/2010) Creat: : 1.00 (05/04/2010)   Chol: 145 (11/25/2009)   HDL: 43 (11/25/2009)   LDL: 84 (11/25/2009)   TG: 92 (11/25/2009)  Problem # 5:  DIABETES MELLITUS, TYPE II (ICD-250.00) Assessment: Comment Only  Her updated medication list for this problem includes:    Glucophage Xr 750 Mg Xr24h-tab (Metformin hcl) .[name]... Take 1 tablet by mouth once a day Patient advised to reduce carbs and sweets, commit to regular physical activity, take meds as prescribed, test blood sugars as directed, and attempt to lose weight , to improve blood sugar control.  Orders: T- Hemoglobin A1C [phone]) T- Hemoglobin A1C ([id number])  Labs Reviewed: Creat: 1.00 (05/04/2010)    Reviewed HgBA1c results: 6.5 (05/04/2010)  6.9 (11/25/2009)  Complete Medication List: 1)  Calcitriol 0.5 Mcg Caps (Calcitriol) .... 2 qd 2)  Singulair 10 Mg Tabs (Montelukast sodium) .... Take 1 tablet by mouth once a day 3)  Klor-con M20 20 Meq Cr-tabs (Potassium chloride crys cr) .... Take 2 tablets in the morning and 1 at 2pm 4)  Amlodipine Besylate 5 Mg Tabs (Amlodipine besylate) .... Take 1 tablet by mouth once a day 5)  Phentermine Hcl 37.5 Mg Tabs (Phentermine hcl) .... Take 1 tablet by mouth once a day 6)  Glucophage Xr 750 Mg Xr24h-tab  (Metformin hcl) .... Take 1 tablet by mouth once a day 7)  Singulair 10 Mg Tabs (Montelukast sodium) .... Take 1 tablet by mouth once a day 8)  Hemocyte-f 324-1 Mg Tabs (Ferrous fumarate-folic acid) .... Take 1 tablet by mouth two times a day  Other Orders: T-Lipid Profile ([id number]) T-TSH ([id number])  Patient Instructions: 1)  Please schedule a follow-up appointment in 3 months. 2)  It is important that you exercise regularly at least 30 minutes 6 times a week. If you develop chest pain, have severe difficulty breathing, or feel very tired , stop exercising immediately and seek medical attention. 3)  You need to lose weight. Consider a lower calorie diet and regular exercise.  4)  BMP prior to visit, ICD-9: 5)  Lipid Panel prior to visit, ICD-9: 6)  TSH prior to visit, ICD-9:  fasting today 7)  CBC w/ Diff prior to visit, ICD-9: 8)  HbgA1C prior to visit, ICD-9: 9)  Pls discuss with dermatologyl the rash on yourleg 10)  BMP prior to visit, ICD-9: 11)  HbgA1C prior to visit, ICD-9:  non fastin in 3 months 12)  you will get diet sheets and referral to the dietitian Prescriptions: HEMOCYTE-F 324-1 MG TABS (FERROUS FUMARATE-FOLIC ACID) Take 1 tablet by mouth two times a day  #60 x 3   Entered and Authorized by:   Syliva Overman MD   Signed by:   Syliva Overman MD on 10/30/2010   Method used:   Historical   RxID:   [id number] HEMOCYTE-F 324-1 MG TABS (FERROUS FUMARATE-FOLIC ACID) Take 1 tablet by mouth once a day  #30 x 4   Entered and Authorized by:   Syliva Overman MD   Signed by:   Syliva Overman MD on 10/30/2010   Method used:   Historical   RxID:   [id number] PHENTERMINE HCL 37.5 MG TABS (PHENTERMINE HCL) Take 1 tablet by mouth once a day  #30 x 2   Entered by:   Adella Hare [name]   Authorized by:   Syliva Overman MD   Signed by:   Adella Hare [name] on 84/69/6295   Method used:   Printed then faxed to ...       [facility] [address] [facility] ([facility]ail)       [address]       [place], [place]  [address]       Ph: [phone]       Fax: [phone]   RxID:   [id number] GLUCOPHAGE XR 750 MG XR24H-TAB (METFORMIN HCL) Take 1 tablet by mouth once a day  #30 x 3   Entered by:   Adella Hare [name]   Authorized by:   Syliva Overman MD   Signed by:   Adella Hare [name] on 18/84/1660   Method used:   Electronically to        [facility]  [address] (retail)       [address]       [place], [place]  [address]       Ph: [phone]       Fax: [phone]   RxID:   [id number] AMLODIPINE BESYLATE 5 MG TABS (AMLODIPINE BESYLATE) Take 1 tablet by mouth once a day  #30 Each x 3   Entered by:   Adella Hare [name]   Authorized by:   Syliva Overman MD   Signed by:   Adella Hare [name] on 73/71/0626   Method used:   Electronically to        [facility]  [address] (retail)       [address]       [place], [place]  [address]       Ph: [phone]       Fax: [phone]   RxID:   [id number] SINGULAIR 10 MG TABS (MONTELUKAST SODIUM) Take 1 tablet by mouth once a day  #30 x 4   Entered and Authorized by:   Syliva Overman MD   Signed by:   Syliva Overman MD on 10/26/2010   Method used:   Electronically to        [facility] [facility] [address] [facility] ([facility])       [address]       [place]  [place], [place]  [address]       Ph: [phone]       Fax: [phone]   RxID:   [id number]    Orders Added: 1)  Est. Patient Level IV [84[address]] 2)  T-Basic Metabolic Panel [phone] 3)  T-Lipid Profile [80061-22930] 4)  T-CBC w/Diff [[id number]] 5)  T- Hemoglobin A1C [83036-23375] 6)  T-TSH [[id number]] 7)  T-Basic Metabolic Panel [[id number]] 8)  T- Hemoglobin A1C [83036-23375] 9)  Neurology Referral [Neuro] 10)  Sleep Disorder Referral [Sleep Disorder]

## 2010-12-26 NOTE — Assessment & Plan Note (Signed)
[facility]                       [place] [facility] OFFICE NOTE   NAME:Borgmeyer, LUNETTA MARINA                MRN:          [id number]  DATE:11/26/2008                            DOB:          03-Jul-1961    CHIEF COMPLAINT:  Shortness of breath.   Ms. Harvel Quale Fluorence is a delightful 50 year old married black female  referred by Dr. Syliva Overman for consultation concerning about a 3-  week history of increased shortness of breath and dyspnea.   She has had either an allergic reaction with a heavy pollen this spring  or some sort of bronchitis over the last couple of weeks.  Symptoms were  predominately dry cough, headache, and wheezing.  She denies any fever,  chills, arthralgias, or myalgias.  She developed some lower extremity  edema and was placed on some furosemide.  Her blood pressure at one  point was low according to her and her hydrochlorothiazide was  discontinued.  She is still on furosemide now.   She denies any orthopnea or PND.  She complains of some occasional  congestion up in her throat.  It sometimes hurts a little bit to take a  deep breath but is not a pleuritic pain.   Her biggest complaint is dyspnea on exertion, even with minimal  exertion.   Her cardiac risk factors are obesity, sedentary lifestyle, glucose  intolerance on metformin, and a history of hypertension for 5 years.   PAST MEDICAL HISTORY:  She has had a partial hysterectomy and a breast  reduction from a surgical standpoint.   She has no known drug allergies.   She does not smoke or drink.  She never has smoked.   CURRENT MEDICATIONS:  1. Metformin 500 mg p.o. b.i.d.  2. Singulair 10 mg per day.  3. Calcitriol 0.5 mg per day.  4. Potassium 40 mEq in the morning, 20 in the evening.  5. Furosemide 20 mg p.o. b.i.d.  6. Slow iron.   She has had carotid Dopplers in the last couple years which showed  minimal plaque.  She had an antegrade flow in  both vertebrals.  In  addition, she had a chest x-ray about 2 years ago which showed no  cardiomegaly.   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  She is married.  She has no children.   FAMILY HISTORY:  Her father died of stroke at age 13.  Her mother has  had some heart problems but does not sound like it is coronary.  There  is no premature history of coronary disease or heart failure or sudden  death in her family.   REVIEW OF SYSTEMS:  She wears glasses.  Her other review of systems are  totally negative other than the HPI.  Please refer our diagnostic  evaluation form.   PHYSICAL EXAMINATION:  GENERAL:  She is very pleasant, alert and  oriented x3, overweight.  VITAL SIGNS:  Blood pressure 130/78 in the right arm, pulse 78 and  regular.  She is 5 feet, weight is 251.  HEENT:  Normocephalic, atraumatic.  PERRLA.  Extraocular movements  intact.  Sclerae are clear.  Facial symmetry is normal.  Dentition is in  good shape.  Oral mucosa is normal.  NECK:  Supple.  Carotid upstrokes were equal bilaterally without bruits.  No JVD.  Thyroid is not enlarged.  Trachea is midline.  CHEST:  Lungs  are clear to auscultation and percussion.  No rhonchi or wheezes.  HEART:  Poorly-appreciated PMI.  She has large breasts.  Normal S1 and  S2.  No murmur, rub, or gallop.  S2 splits physiologically.  There is no  obvious gallop.  A right ventricular lift could not be appreciated.  ABDOMEN:  Obese with good bowel sounds.  Organomegaly could not be  assessed accurately.  EXTREMITIES:  Only trace pedal edema.  Pulses were present bilaterally  symmetrical.  There is no sign of DVT.  NEURO:  Intact.  SKIN:  Unremarkable.   Electrocardiogram was completely normal.   ASSESSMENT:  Dyspnea and shortness of breath over the past several weeks  associated with heavy pollinated spring versus bronchitis.  He really  sounds like the former.  Rule out myocardial dysfunction  with her risk  factors and with the pedal edema.   PLAN:  1. 2-D echocardiogram.  2. Chest x-ray per Dr. Lodema Hong.   If these are negative, reassurance will be given.  I spent a fair amount  of time emphasizing primary preventative care to prevent future vascular  events with her obesity, hypertension, and glucose intolerance.  She has  an excellent lipid profiles, I reviewed with her today except for an HDL  of 35.  He is obviously blessed with good metabolic genes.   I have recommended exercise and weight reduction.   We will call with the results of the 2-D echocardiogram and chest x-ray.  If they are within normal limits, we will see her back on a p.r.n.  basis.     Thomas C. Daleen Squibb, MD, [facility]  Electronically Signed    TCW/MedQ  DD: 11/26/2008  DT: 11/27/2008  Job #: [id number]   cc:   Milus Mallick. Lodema Hong, M.D.

## 2010-12-29 NOTE — Op Note (Signed)
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   PROCEDURES:  Bilateral breast reductions using the inferior pedicle  technique with reduction mammoplasty.   ANESTHESIA:  General.   DESCRIPTION OF PROCEDURE:  Preoperatively, the patient was set up and drawn  for the inferior pedicle reduction mammoplasty.  Remarking the nipple  areolar complex from over 40 cm from the suprasternal notch to approximately  23.  She then underwent general anesthesia and intubated orally.  Prep was  done to the chest breast areas in routine fashion.  Using Betadine soaping  solution, walled off with sterile towels and drape so as to make a sterile  field, 0.25% Xylocaine with epinephrine was injected locally, 150 cc per  side, 1:400,000 concentration.  The wounds were scored with #15 blades, and  the scale of the inferior pedicle was de-epithelialized with a #20 blade.  Medial and lateral fatty dermal pedicles were excised on underlying fascia.  Laterally, more tissue was removed, a lot of excessive breast tissue.  A new  keyhole area was also debulked, and after proper hemostasis, the flaps were  brought back together and secured with 3-0 Prolene.  Subcutaneous closures  done with 3-0 Monocryl x2 layers and then running subcuticular stitches of 3-  0 Monocryl and 5-0 Monocryl throughout the inverted T.  The wounds were  dangling with #10 full occluded Blake  drains which were placed in the depths  of the wound, and brought up to the lateral-most portion of the incision and  secured with 3-0 Prolene.  After procedure was done, the nipple areolar  complexes were examined showing excellent blood supply.  Estimated blood  loss less than 150 cc.   COMPLICATIONS:  None.      Sabrina [name]   GLT/MEDQ  D:  06/19/2004  T:  06/19/2004  Job:  [id number]

## 2010-12-29 NOTE — Discharge Summary (Signed)
NAME:  Sabrina [name], Sabrina [name]                   ACCOUNT NO.:  [id number]   MEDICAL RECORD NO.:  [id number]                   PATIENT TYPE:  INP   LOCATION:  9303                                 FACILITY:  [facility]   PHYSICIAN:  Cynthia P. Romine, M.D.             DATE OF BIRTH:  10-19-1960   DATE OF ADMISSION:  06/07/2003  DATE OF DISCHARGE:  06/10/2003                                 DISCHARGE SUMMARY   DISCHARGE DIAGNOSES:  Large symptomatic fibroids.   PROCEDURE:  Supracervical abdominal hysterectomy.   HISTORY:  This is a 50 year old married black female gravida 0, para 0 with  asymptomatic 22-week sized fibroids admitted for total abdominal  hysterectomy.  On June 07, 2003 she was taken to the operating room where  she underwent an abdominal supracervical hysterectomy.  Supracervical  hysterectomy was done rather than total hysterectomy because of the  difficulty in removal of the cervix because of the patient's body habitus  and because of blood loss during the beginning of the hysterectomy.  There  was a 1250 mL estimated blood loss from the procedure.  The patient did well  postoperatively.  She had had a Jackson-Pratt that drained only minimal  amount and was removed on postoperative day #2.  She did have an ON-Q system  in place that provided good pain relief.  She was afebrile and in  satisfactory condition for discharge on postoperative day #3.  Her ON-Q  system was removed.  She was given discharge instructions to return in two  days for removal of her staples and in six weeks for a routine postoperative  check.  She was given a prescription for Percocet #20 tablets 5 mg one p.o.  q.4h. p.r.n. pain.   LABORATORIES:  On admission her H&H was 10 and 30, on discharge 9.5 and  27.4.  Blood type was A+.  Pathology report showed numerous leiomyomata  measuring up to 13.0 cm.  The body of the uterus with the fibroid had a  weight of 1000 g.               Cynthia P. Romine, M.D.    CPR/MEDQ  D:  07/20/2003  T:  07/20/2003  Job:  [id number]

## 2010-12-29 NOTE — Op Note (Signed)
NAME:  Sabrina [name], PETROS                   ACCOUNT NO.:  [id number]   MEDICAL RECORD NO.:  [id number]                   PATIENT TYPE:  INP   LOCATION:  9399                                 FACILITY:  [facility]   PHYSICIAN:  Cynthia P. Romine, M.D.             DATE OF BIRTH:  July 14, 1961   DATE OF PROCEDURE:  06/07/2003  DATE OF DISCHARGE:                                 OPERATIVE REPORT   PREOPERATIVE DIAGNOSIS:  Large symptomatic uterine fibroids.   POSTOPERATIVE DIAGNOSIS:  Large symptomatic uterine fibroids, pathology  pending.   PROCEDURE:  Supracervical abdominal hysterectomy.   SURGEON:  Cynthia P. Romine, M.D.   ASSISTANT:  Andres Ege, M.D.   ANESTHESIA:  General endotracheal.   ESTIMATED BLOOD LOSS:  1250 mL.   COMPLICATIONS:  None.   DESCRIPTION OF PROCEDURE:  The patient was taken to the operating room, and  after induction of adequate general anesthesia was prepped in the usual  fashion and a Foley catheter placed.  A vertical midline incision was made  and carried down to the fascia using the deep knife.  The patient was quite  obese and it was a significant distance through the subcutaneous tissue to  even go down to the fascia.  The fascia was nicked and opened vertically.  The rectus muscles were separated sharply in the midline and the underlying  peritoneum was elevated between hemostats, entered atraumatically.  The  peritoneum was opened vertically.  The abdomen was inspected.  The uterus  was approximately 22 weeks' size, extended above the umbilicus.  It was  mobile.  There were several pedunculated fibroids on both sides of the  uterus.  The uterus was exteriorized to examine the fibroid.  There was a  large fibroid off the right fundus, approximately 8 cm, that was bleeding  slightly at its base.  There was another smaller approximately 6 cm fibroid  off the left fundus.  There were multiple 4 and 5 cm fibroids off the  posterior surface of the  uterus all the way down to the cervix.  There were  several intramural fibroids, and there was an approximately 4 cm fibroid  intramurally right at the level of the uterine artery on the patient's  right.  The ovaries and tubes appeared normal.  With the uterus  exteriorized, it was impossible to even see the round ligaments to begin the  hysterectomy.  Therefore, the uterus was put back into the abdomen.  A  Balfour retractor was placed to deepen the deep blade where it is not  sufficiently deep to provide adequate retraction.  An O'Connor-O'Sullivan  retractor was then used with some success.  It was still impossible to see  the round ligaments on either side, and therefore it was decided to perform  myomectomy in order to improve exposure.  The large fibroid on the patient's  right was brought out through the incision.  It was removed with a Bovie  and  several big sutures of #1 Vicryl were used at its base to control the  bleeding.  The smaller fibroid off the patient's left was taken off in a  similar fashion, and then the intramural fibroids were taken out through a  single vertical incision in the midline anteriorly and this incision also  closed with big #1 Vicryl sutures.  Upon compressing the uterus after  removal of these fibroids, it was possible to see the round ligaments, but  exposure was still very difficult.  The round ligaments were tied, opened  with the Bovie, and then with much difficulty and manipulation of this still-  large uterus, the anterior leaf of the broad ligament was taken down.  The  pedicle containing the utero-ovarian ligament and the tube and the round  ligament was clamped on both sides, cut, and doubly tied.  The pedicles  containing the uterine artery were visualized, clamped, cut, and tied on  each side.  It was felt, however, that not all of the blood supply was  controlled on the patient's left because of the presence of that fibroid and  a second  bite was necessary before it was felt like the blood supply was  controlled.  At this point we were operating very deep in the pelvis.  Visualization was extremely difficult, and the decision was made to remove  the fundus off the cervix, hopefully to improve visualization.  This was  done very carefully and with great difficulty, and the fundus was removed  and the cervix that was remaining grasped with Kochers.  The specimen was  sent to pathology.  Even with the fundus now removed, the pelvis was so deep  because of the patient's obesity that visualization was difficult, and it  was at this point that the decision was made to do a supracervical  hysterectomy and not try and remove the cervix because removing the cervix  we thought would put the patient at more risk for an operative complication  than it would be worth.  Therefore, the cervix was oversewn with a figure-of-  eight of 0 Vicryl.  The pelvis was irrigated and felt to be hemostatic.  The  ovaries were tied up to the pedicles containing the round ligaments to  prevent later dyspareunia.  The pedicles were all again inspected and felt  to be free of bleeding.  The bowel was allowed to fall into its anatomic  position.  The peritoneum was grasped with hemostats and closed in a running  fashion using 2-0 chromic.  The fascia was closed with interrupted figure-of-  eights of 0 Vicryl.  Visualization continued to be difficult and the  procedure continued to be very difficult because of the limitations caused  by her obesity.  Before the fascia was closed, an On-Q catheter was placed  in the subfascial space in the usual fashion.  However, because of her  obesity it was felt that she needed to have a subcutaneous Jackson-Pratt  drain and it did not seem to make sense to put in a subcutaneous On-Q and a  subcutaneous Jackson-Pratt because it would just remove the anesthetic; therefore, the Jackson-Pratt was placed and the second On-Q  catheter was not  placed in the subcu space.  Subcu tissue was irrigated, hemostasis achieved  with the Bovie, and the skin was closed with staples.  It had become  necessary during removal of the fibroid initially to extend the incision up  around the patient's umbilicus to  improve visualization and mobilization of  this extremely large uterus; therefore, the incision was closed up and  around the umbilicus.  The subcutaneous tissue was injected with 10 mL of  0.5% Marcaine with epinephrine, the subfascial On-Q catheter was primed with  10 mL of 1% Xylocaine, and then hooked up to the bulb delivering 50 mL of  0.25% Marcaine with epinephrine every hour.  The procedure was terminated  and the patient was taken to the recovery room in satisfactory condition.                                               Cynthia P. Romine, M.D.    CPR/MEDQ  D:  06/07/2003  T:  06/07/2003  Job:  [id number]

## 2011-01-23 ENCOUNTER — Encounter: Payer: Self-pay | Admitting: Family Medicine

## 2011-01-24 ENCOUNTER — Other Ambulatory Visit: Payer: Self-pay | Admitting: Family Medicine

## 2011-01-24 LAB — BASIC METABOLIC PANEL
BUN: 18 mg/dL (ref 6–23)
CO2: 29 mEq/L (ref 19–32)
Calcium: 8.1 mg/dL — ABNORMAL LOW (ref 8.4–10.5)
Chloride: 100 mEq/L (ref 96–112)
Creat: 0.97 mg/dL (ref 0.50–1.10)
Potassium: 3.6 mEq/L (ref 3.5–5.3)
Sodium: 141 mEq/L (ref 135–145)

## 2011-01-26 ENCOUNTER — Encounter: Payer: Self-pay | Admitting: Family Medicine

## 2011-01-26 ENCOUNTER — Ambulatory Visit (INDEPENDENT_AMBULATORY_CARE_PROVIDER_SITE_OTHER): Payer: PRIVATE HEALTH INSURANCE | Admitting: Family Medicine

## 2011-01-26 VITALS — BP 120/60 | Ht 61.0 in | Wt 261.1 lb

## 2011-01-26 DIAGNOSIS — E669 Obesity, unspecified: Secondary | ICD-10-CM

## 2011-01-26 DIAGNOSIS — J309 Allergic rhinitis, unspecified: Secondary | ICD-10-CM

## 2011-01-26 DIAGNOSIS — Z13220 Encounter for screening for lipoid disorders: Secondary | ICD-10-CM

## 2011-01-26 DIAGNOSIS — E119 Type 2 diabetes mellitus without complications: Secondary | ICD-10-CM

## 2011-01-26 DIAGNOSIS — I10 Essential (primary) hypertension: Secondary | ICD-10-CM

## 2011-01-26 MED ORDER — LORATADINE 10 MG PO TABS: 10.0000 mg | ORAL_TABLET | Freq: Every day | ORAL | Status: DC

## 2011-01-26 NOTE — Patient Instructions (Addendum)
F/u in 4 months.  Let's eat out of the blender.  congrats on blood sugar.  No med changes.  Fasting lipid and hBA1c and chem 7 in  4 months

## 2011-01-27 ENCOUNTER — Encounter: Payer: Self-pay | Admitting: Family Medicine

## 2011-01-27 NOTE — Progress Notes (Signed)
  Subjective:    Patient ID: Sabrina [name], female    DOB: 09/06/1960, 50 y.o.   MRN: [id number]  HPI The PT is here for follow up and re-evaluation of chronic medical conditions, medication management and review of recent lab and radiology data.  Preventive health is updated, specifically  Cancer screening, and Immunization.   Questions or concerns regarding consultations or procedures which the PT has had in the interim are  addressed. The PT denies any adverse reactions to current medications since the last visit.  Her weight continues to increase despite efforts at lifestyle change. She is currently seeing a dietitian as well as is on phentermine.    Review of Systems Denies recent fever or chills. Denies sinus pressure, nasal congestion, ear pain or sore throat. Denies chest congestion, productive cough or wheezing. Denies chest pains, palpitations, paroxysmal nocturnal dyspnea, orthopnea and leg swelling Denies abdominal pain, nausea, vomiting,diarrhea or constipation.  Denies rectal bleeding or change in bowel movement. Denies dysuria, frequency, hesitancy or incontinence. Denies joint pain, swelling and limitation in mobility. Denies headaches, seizure, numbness, or tingling. Denies depression, anxiety or insomnia. Denies skin break down or rash.        Objective:   Physical Exam Patient alert and oriented and in no Cardiopulmonary distress.  HEENT: No facial asymmetry, EOMI, no sinus tenderness, TM's clear, Oropharynx pink and moist.  Neck supple no adenopathy.  Chest: Clear to auscultation bilaterally.  CVS: S1, S2 no murmurs, no S3.  ABD: Soft non tender. Bowel sounds normal.  Ext: No edema  MS: Adequate ROM spine, shoulders, hips and knees.  Skin: Intact, no ulcerations or rash noted.  Psych: Good eye contact, normal affect. Memory intact not anxious or depressed appearing.  CNS: CN 2-12 intact, power, tone and sensation normal throughout.         Assessment & Plan:

## 2011-01-27 NOTE — Assessment & Plan Note (Signed)
Controlled, no change in medication  

## 2011-01-27 NOTE — Assessment & Plan Note (Signed)
Deteriorated. Patient re-educated about  the importance of commitment to a  minimum of 150 minutes of exercise per week. The importance of healthy food choices with portion control discussed. Encouraged to start a food diary, count calories and to consider  joining a support group. Sample diet sheets offered. Goals set by the patient for the next several months.    

## 2011-02-28 ENCOUNTER — Other Ambulatory Visit: Payer: Self-pay | Admitting: Family Medicine

## 2011-03-01 ENCOUNTER — Telehealth: Payer: Self-pay | Admitting: Family Medicine

## 2011-03-01 NOTE — Telephone Encounter (Signed)
Sabrina [name] already filled this am

## 2011-03-29 ENCOUNTER — Other Ambulatory Visit: Payer: Self-pay | Admitting: Family Medicine

## 2011-04-14 LAB — HM DIABETES EYE EXAM

## 2011-04-19 ENCOUNTER — Other Ambulatory Visit: Payer: Self-pay | Admitting: *Deleted

## 2011-04-19 MED ORDER — METFORMIN HCL ER 750 MG PO TB24: 750.0000 mg | ORAL_TABLET | Freq: Every day | ORAL | Status: DC

## 2011-05-14 ENCOUNTER — Other Ambulatory Visit: Payer: Self-pay | Admitting: Family Medicine

## 2011-05-31 ENCOUNTER — Encounter: Payer: Self-pay | Admitting: Family Medicine

## 2011-06-04 ENCOUNTER — Encounter: Payer: PRIVATE HEALTH INSURANCE | Admitting: Family Medicine

## 2011-06-05 ENCOUNTER — Encounter: Payer: Self-pay | Admitting: Cardiology

## 2011-06-20 ENCOUNTER — Encounter: Payer: Self-pay | Admitting: Family Medicine

## 2011-06-21 ENCOUNTER — Encounter: Payer: Self-pay | Admitting: Family Medicine

## 2011-06-21 ENCOUNTER — Ambulatory Visit (INDEPENDENT_AMBULATORY_CARE_PROVIDER_SITE_OTHER): Payer: PRIVATE HEALTH INSURANCE | Admitting: Family Medicine

## 2011-06-21 VITALS — BP 110/70 | HR 110 | Resp 16 | Ht 61.0 in | Wt 264.4 lb

## 2011-06-21 DIAGNOSIS — R059 Cough, unspecified: Secondary | ICD-10-CM

## 2011-06-21 DIAGNOSIS — E119 Type 2 diabetes mellitus without complications: Secondary | ICD-10-CM

## 2011-06-21 DIAGNOSIS — E669 Obesity, unspecified: Secondary | ICD-10-CM

## 2011-06-21 DIAGNOSIS — R05 Cough: Secondary | ICD-10-CM | POA: Insufficient documentation

## 2011-06-21 DIAGNOSIS — Z23 Encounter for immunization: Secondary | ICD-10-CM

## 2011-06-21 DIAGNOSIS — R058 Other specified cough: Secondary | ICD-10-CM | POA: Insufficient documentation

## 2011-06-21 DIAGNOSIS — J309 Allergic rhinitis, unspecified: Secondary | ICD-10-CM

## 2011-06-21 DIAGNOSIS — I10 Essential (primary) hypertension: Secondary | ICD-10-CM

## 2011-06-21 MED ORDER — PHENTERMINE HCL 37.5 MG PO CAPS: 37.5000 mg | ORAL_CAPSULE | ORAL | Status: DC

## 2011-06-21 NOTE — Patient Instructions (Signed)
F/u in 2 months.  PLEASE commit to stopping drinking all sweetened beverages including fruit juices.  Commit to eating on a regular schedule at least 3 meals per day   Inhalers is prescribed for the cough, call if you need prednisone   Nasal spray also prescribed  Weight loss goal of 3 to 4 pounds per month.  Please aim for 7 hours of sleep per night

## 2011-06-21 NOTE — Progress Notes (Signed)
  Subjective:    Patient ID: Sabrina [name], female    DOB: 10-May-1961, 50 y.o.   MRN: [id number]  HPI The PT is here for follow up and re-evaluation of chronic medical conditions, medication management and review of any available recent lab and radiology data.  Preventive health is updated, specifically  Cancer screening and Immunization.   Questions or concerns regarding consultations or procedures which the PT has had in the interim are  addressed. The PT denies any adverse reactions to current medications since the last visit.  Concerned about uncontrolled dry cough with wheeze, also increased nasal congestion, clear drainage . Concerned about failure to lose weight , has appt with bariatric surgery this week an is excited about this      Review of Systems See HPI Denies recent fever or chills.  Denies chest congestion or  productive cough  Denies chest pains, palpitations and leg swelling Denies abdominal pain, nausea, vomiting,diarrhea or constipation.   Denies dysuria, frequency, hesitancy or incontinence. Denies joint pain, swelling and limitation in mobility. Denies headaches, seizures, numbness, or tingling.  Denies skin break down or rash.        Objective:   Physical Exam Patient alert and oriented and in no cardiopulmonary distress.  HEENT: No facial asymmetry, EOMI, no sinus tenderness,  oropharynx pink and moist.  Neck supple no adenopathy.Erythema and edema of nasal mucosa  Chest: Clear to auscultation bilaterally.Decreased air entry bilaterally CVS: S1, S2 no murmurs, no S3.  ABD: Soft non tender. Bowel sounds normal.  Ext: No edema  MS: Adequate ROM spine, shoulders, hips and knees.  Skin: Intact, no ulcerations or rash noted.  Psych: Good eye contact, normal affect. Memory intact not anxious or depressed appearing.  CNS: CN 2-12 intact, power, tone and sensation normal throughout.        Assessment & Plan:

## 2011-06-22 DIAGNOSIS — Z23 Encounter for immunization: Secondary | ICD-10-CM

## 2011-06-22 LAB — LIPID PANEL
Cholesterol: 147 mg/dL (ref 0–200)
HDL: 36 mg/dL — ABNORMAL LOW (ref >39)
Total CHOL/HDL Ratio: 4.1 Ratio
VLDL: 22 mg/dL (ref 0–40)

## 2011-06-22 LAB — HEMOGLOBIN A1C
Hgb A1c MFr Bld: 6.9 % — ABNORMAL HIGH (ref <5.7)
Mean Plasma Glucose: 151 mg/dL — ABNORMAL HIGH (ref <117)

## 2011-06-24 NOTE — Assessment & Plan Note (Signed)
Deteriorated. Patient re-educated about  the importance of commitment to a  minimum of 150 minutes of exercise per week. The importance of healthy food choices with portion control discussed. Encouraged to start a food diary, count calories and to consider  joining a support group. Sample diet sheets offered. Goals set by the patient for the next several months.    

## 2011-06-24 NOTE — Assessment & Plan Note (Signed)
Controlled, no change in medication  

## 2011-06-24 NOTE — Assessment & Plan Note (Signed)
Deteriorated, start steroid inhaler and improve control of allergic rhinitis with use of intranasal steroid

## 2011-06-24 NOTE — Assessment & Plan Note (Signed)
Deteriorated, pr needs to be more focused on appropriate diet and regular exercise to lose weight and improve blood sugars

## 2011-06-26 ENCOUNTER — Other Ambulatory Visit: Payer: Self-pay | Admitting: Family Medicine

## 2011-06-26 MED ORDER — METFORMIN HCL ER 750 MG PO TB24: ORAL_TABLET | ORAL | Status: DC

## 2011-07-03 ENCOUNTER — Other Ambulatory Visit: Payer: Self-pay | Admitting: Family Medicine

## 2011-07-03 DIAGNOSIS — Z139 Encounter for screening, unspecified: Secondary | ICD-10-CM

## 2011-07-09 ENCOUNTER — Ambulatory Visit (HOSPITAL_COMMUNITY)
Admission: RE | Admit: 2011-07-09 | Discharge: 2011-07-09 | Disposition: A | Discharge status: Home / Self Care | Payer: PRIVATE HEALTH INSURANCE | Source: Ambulatory Visit | Attending: Family Medicine | Admitting: Family Medicine

## 2011-07-09 ENCOUNTER — Ambulatory Visit (HOSPITAL_COMMUNITY): Payer: PRIVATE HEALTH INSURANCE

## 2011-07-09 DIAGNOSIS — Z139 Encounter for screening, unspecified: Secondary | ICD-10-CM

## 2011-07-09 DIAGNOSIS — Z1231 Encounter for screening mammogram for malignant neoplasm of breast: Secondary | ICD-10-CM | POA: Insufficient documentation

## 2011-08-13 ENCOUNTER — Encounter: Payer: Self-pay | Admitting: Family Medicine

## 2011-08-22 ENCOUNTER — Ambulatory Visit (INDEPENDENT_AMBULATORY_CARE_PROVIDER_SITE_OTHER): Payer: PRIVATE HEALTH INSURANCE | Admitting: Family Medicine

## 2011-08-22 ENCOUNTER — Encounter: Payer: Self-pay | Admitting: Family Medicine

## 2011-08-22 VITALS — BP 108/74 | HR 92 | Temp 98.9°F | Resp 16 | Ht 61.0 in | Wt 257.4 lb

## 2011-08-22 DIAGNOSIS — J40 Bronchitis, not specified as acute or chronic: Secondary | ICD-10-CM

## 2011-08-22 DIAGNOSIS — R058 Other specified cough: Secondary | ICD-10-CM

## 2011-08-22 DIAGNOSIS — R05 Cough: Secondary | ICD-10-CM

## 2011-08-22 MED ORDER — DOXYCYCLINE HYCLATE 100 MG PO TABS: 100.0000 mg | ORAL_TABLET | Freq: Two times a day (BID) | ORAL | Status: AC

## 2011-08-22 MED ORDER — BECLOMETHASONE DIPROPIONATE 40 MCG/ACT IN AERS: 2.0000 | INHALATION_SPRAY | Freq: Two times a day (BID) | RESPIRATORY_TRACT | Status: DC

## 2011-08-22 MED ORDER — METHYLPREDNISOLONE 4 MG PO KIT: PACK | ORAL | Status: AC

## 2011-08-22 NOTE — Patient Instructions (Signed)
Take the antibiotics and steroids as directed  If you do not improve please call for instructions  Bronchitis Bronchitis is the body's way of reacting to injury and/or infection (inflammation) of the bronchi. Bronchi are the air tubes that extend from the windpipe into the lungs. If the inflammation becomes severe, it may cause shortness of breath. CAUSES   Inflammation may be caused by:  A virus.     Germs (bacteria).     Dust.    Allergens.    Pollutants and many other irritants.  The cells lining the bronchial tree are covered with tiny hairs (cilia). These constantly beat upward, away from the lungs, toward the mouth. This keeps the lungs free of pollutants. When these cells become too irritated and are unable to do their job, mucus begins to develop. This causes the characteristic cough of bronchitis. The cough clears the lungs when the cilia are unable to do their job. Without either of these protective mechanisms, the mucus would settle in the lungs. Then you would develop pneumonia. Smoking is a common cause of bronchitis and can contribute to pneumonia. Stopping this habit is the single most important thing you can do to help yourself. TREATMENT    Your caregiver may prescribe an antibiotic if the cough is caused by bacteria. Also, medicines that open up your airways make it easier to breathe. Your caregiver may also recommend or prescribe an expectorant. It will loosen the mucus to be coughed up. Only take over-the-counter or prescription medicines for pain, discomfort, or fever as directed by your caregiver.     Removing whatever causes the problem (smoking, for example) is critical to preventing the problem from getting worse.     Cough suppressants may be prescribed for relief of cough symptoms.     Inhaled medicines may be prescribed to help with symptoms now and to help prevent problems from returning.     For those with recurrent (chronic) bronchitis, there may be a need  for steroid medicines.  SEEK IMMEDIATE MEDICAL CARE IF:    During treatment, you develop more pus-like mucus (purulent sputum).     You have a fever.     Your baby is older than 3 months with a rectal temperature of 102 F (38.9 C) or higher.     Your baby is 23 months old or younger with a rectal temperature of 100.4 F (38 C) or higher.     You become progressively more ill.     You have increased difficulty breathing, wheezing, or shortness of breath.  It is necessary to seek immediate medical care if you are elderly or sick from any other disease. MAKE SURE YOU:    Understand these instructions.     Will watch your condition.     Will get help right away if you are not doing well or get worse.  Document Released: 07/30/2005 Document Revised: 04/11/2011 Document Reviewed: 06/08/2008 [facility] Patient Information 2012 [place], [place].

## 2011-08-22 NOTE — Progress Notes (Signed)
  Subjective:    Patient ID: Sabrina [name], female    DOB: 28-Aug-1960, 51 y.o.   MRN: [id number]  HPI  Nasal congestion/cough- patient here for cough x5 days she typically gets bronchitis every winter at this time. She is typically treated with steroid pack a she's tried other remedies in the past. Cough has mild production. Denies shortness of breath. Positive sick contact with has been to have viral illness as well as sinusitis. She is a nonsmoker. Positive diabetes  She has used Qvar inhaler which has helped her breathing with the coughing. She needs a refill on this today. She's not tried the [employer]. She is using Mucinex over-the-counter.       Review of Systems - per above         GEN- denies fatigue, fever, weight loss,weakness,  HEENT- denies eye drainage, +nasal discharge/ congestion, sore throat CVS- denies chest pain, palpitations RESP- denies SOB, cough, wheeze ABD- denies N/V, change in stools, abd pain MSK- denies joint pain, muscle aches, Neuro- + headache,denies dizziness, syncope, seizure activity        Objective:   Physical Exam GEN- NAD, alert and oriented x3 HEENT- PERRL, EOMI, non injected sclera, pink conjunctiva, MMM, mild injection oropharynx, enlarged tonsils, no exudates, TM clear bilat, clear rhinorrhea Neck- Supple,  Lymph- no cervical nodes CVS- RRR, no murmur RESP-CTAB,harsh cough, upper airway congestion EXT- No edema Pulses- Radial, DP- 2+        Assessment & Plan:

## 2011-08-23 DIAGNOSIS — J40 Bronchitis, not specified as acute or chronic: Secondary | ICD-10-CM | POA: Insufficient documentation

## 2011-08-23 NOTE — Assessment & Plan Note (Signed)
Recurrent seasonal bronchitis. Given antibiotics, steroid dose pak, red flags

## 2011-08-27 ENCOUNTER — Ambulatory Visit: Payer: PRIVATE HEALTH INSURANCE | Admitting: Family Medicine

## 2011-09-14 ENCOUNTER — Other Ambulatory Visit: Payer: Self-pay | Admitting: Family Medicine

## 2011-10-11 ENCOUNTER — Telehealth: Payer: Self-pay | Admitting: Family Medicine

## 2011-10-11 NOTE — Telephone Encounter (Signed)
I will accept her spouse as new pt, pls let her know and sched an appt as available pls

## 2012-02-20 ENCOUNTER — Telehealth: Payer: Self-pay | Admitting: Family Medicine

## 2012-02-20 DIAGNOSIS — E669 Obesity, unspecified: Secondary | ICD-10-CM

## 2012-02-20 DIAGNOSIS — E039 Hypothyroidism, unspecified: Secondary | ICD-10-CM

## 2012-02-20 DIAGNOSIS — I10 Essential (primary) hypertension: Secondary | ICD-10-CM

## 2012-02-20 DIAGNOSIS — E559 Vitamin D deficiency, unspecified: Secondary | ICD-10-CM

## 2012-02-20 DIAGNOSIS — R5381 Other malaise: Secondary | ICD-10-CM

## 2012-02-20 DIAGNOSIS — E119 Type 2 diabetes mellitus without complications: Secondary | ICD-10-CM

## 2012-02-20 NOTE — Telephone Encounter (Signed)
Needs fasting cbc, cmp and eGFR, hBA1C, TSH, lipid and vit d also microalb please

## 2012-02-20 NOTE — Telephone Encounter (Signed)
None has been ordered for her. Does she need any?

## 2012-02-20 NOTE — Telephone Encounter (Signed)
Message left with patient and labs faxed to [facility]

## 2012-02-22 ENCOUNTER — Other Ambulatory Visit: Payer: Self-pay | Admitting: Family Medicine

## 2012-02-22 LAB — COMPLETE METABOLIC PANEL WITH GFR
ALT: 9 U/L (ref 0–35)
AST: 14 U/L (ref 0–37)
Albumin: 4.2 g/dL (ref 3.5–5.2)
Calcium: 8.4 mg/dL (ref 8.4–10.5)
Chloride: 99 mEq/L (ref 96–112)
Creat: 0.92 mg/dL (ref 0.50–1.10)
Potassium: 3.8 mEq/L (ref 3.5–5.3)

## 2012-02-22 LAB — CBC WITH DIFFERENTIAL/PLATELET
Basophils Absolute: 0 10*3/uL (ref 0.0–0.1)
Lymphocytes Relative: 35 % (ref 12–46)
Neutro Abs: 6.7 10*3/uL (ref 1.7–7.7)
Platelets: 467 10*3/uL — ABNORMAL HIGH (ref 150–400)
RDW: 15.1 % (ref 11.5–15.5)
WBC: 12.3 10*3/uL — ABNORMAL HIGH (ref 4.0–10.5)

## 2012-02-22 LAB — LIPID PANEL
Cholesterol: 153 mg/dL (ref 0–200)
HDL: 35 mg/dL — ABNORMAL LOW (ref >39)
LDL Cholesterol: 98 mg/dL (ref 0–99)
Triglycerides: 101 mg/dL (ref <150)

## 2012-02-22 LAB — HEMOGLOBIN A1C: Mean Plasma Glucose: 134 mg/dL — ABNORMAL HIGH (ref <117)

## 2012-02-23 LAB — MICROALBUMIN / CREATININE URINE RATIO: Microalb Creat Ratio: 110.8 mg/g — ABNORMAL HIGH (ref 0.0–30.0)

## 2012-02-25 ENCOUNTER — Ambulatory Visit (INDEPENDENT_AMBULATORY_CARE_PROVIDER_SITE_OTHER): Payer: PRIVATE HEALTH INSURANCE | Admitting: Family Medicine

## 2012-02-25 ENCOUNTER — Encounter: Payer: Self-pay | Admitting: Family Medicine

## 2012-02-25 VITALS — BP 130/80 | HR 91 | Resp 16 | Ht 61.0 in | Wt 250.0 lb

## 2012-02-25 DIAGNOSIS — N2581 Secondary hyperparathyroidism of renal origin: Secondary | ICD-10-CM

## 2012-02-25 DIAGNOSIS — E669 Obesity, unspecified: Secondary | ICD-10-CM

## 2012-02-25 DIAGNOSIS — E559 Vitamin D deficiency, unspecified: Secondary | ICD-10-CM

## 2012-02-25 DIAGNOSIS — I10 Essential (primary) hypertension: Secondary | ICD-10-CM

## 2012-02-25 DIAGNOSIS — E119 Type 2 diabetes mellitus without complications: Secondary | ICD-10-CM

## 2012-02-25 DIAGNOSIS — G4710 Hypersomnia, unspecified: Secondary | ICD-10-CM

## 2012-02-25 DIAGNOSIS — R7989 Other specified abnormal findings of blood chemistry: Secondary | ICD-10-CM

## 2012-02-25 DIAGNOSIS — E039 Hypothyroidism, unspecified: Secondary | ICD-10-CM

## 2012-02-25 DIAGNOSIS — G4730 Sleep apnea, unspecified: Secondary | ICD-10-CM

## 2012-02-25 DIAGNOSIS — R809 Proteinuria, unspecified: Secondary | ICD-10-CM

## 2012-02-25 DIAGNOSIS — R6889 Other general symptoms and signs: Secondary | ICD-10-CM

## 2012-02-25 LAB — T3, FREE: T3, Free: 2.5 pg/mL (ref 2.3–4.2)

## 2012-02-25 MED ORDER — AMLODIPINE BESYLATE 5 MG PO TABS: ORAL_TABLET | ORAL | Status: DC

## 2012-02-25 MED ORDER — ERGOCALCIFEROL 1.25 MG (50000 UT) PO CAPS: 50000.0000 [IU] | ORAL_CAPSULE | ORAL | Status: DC

## 2012-02-25 NOTE — Progress Notes (Signed)
  Subjective:    Patient ID: Sabrina [name], female    DOB: 07-03-1961, 51 y.o.   MRN: [id number]  HPI The PT is here for follow up and re-evaluation of chronic medical conditions, medication management and review of any available recent lab and radiology data.  Preventive health is updated, specifically  Cancer screening and Immunization.   Questions or concerns regarding consultations or procedures which the PT has had in the interim are  addressed. The PT denies any adverse reactions to current medications since the last visit.  Still snores excessively with fatigue states she will go for sleep study Needs endo re evaluation, and has mildly elevated TSh Has changed diet with some weight loss success, wants to resume phentermine, and will become more consistent with exercise, will await lab results on thyroid  Review of Systems See HPI Denies recent fever or chills.c/o fatigue Denies sinus pressure, nasal congestion, ear pain or sore throat. Denies chest congestion, productive cough or wheezing. Denies chest pains, palpitations and leg swelling Denies abdominal pain, nausea, vomiting,diarrhea or constipation.   Denies dysuria, frequency, hesitancy or incontinence. Denies joint pain, swelling and limitation in mobility. Denies headaches, seizures, numbness, or tingling. Denies depression, anxiety or insomnia. Denies skin break down or rash.       Objective:   Physical Exam  Patient alert and oriented and in no cardiopulmonary distress.  HEENT: No facial asymmetry, EOMI, no sinus tenderness,  oropharynx pink and moist.  Neck supple no adenopathy.  Chest: Clear to auscultation bilaterally.  CVS: S1, S2 no murmurs, no S3.  ABD: Soft non tender. Bowel sounds normal.  Ext: No edema  MS: Adequate ROM spine, shoulders, hips and knees.  Skin: Intact, no ulcerations or rash noted.  Psych: Good eye contact, normal affect. Memory intact not anxious or depressed  appearing.  CNS: CN 2-12 intact, power, tone and sensation normal throughout. Diabetic Foot Check:  Appearance - no lesions, ulcers or calluses Skin - no unusual pallor or redness Sensation - grossly intact to light touch Monofilament testing -  Right - Great toe, medial, central, lateral ball and posterior foot intact Left - Great toe, medial, central, lateral ball and posterior foot intact Pulses Left - Dorsalis Pedis and Posterior Tibia normal Right - Dorsalis Pedis and Posterior Tibia normal       Assessment & Plan:

## 2012-02-25 NOTE — Assessment & Plan Note (Signed)
Marked proteinuria, EGFR normal, will obtain renal US, currently diabetes and hypertension are well controlled

## 2012-02-25 NOTE — Patient Instructions (Addendum)
F/u in 4 month  hBA1C before next visit.    Start weekly vit D  You are referred for sleeep study and re evaluation by endocrinologist, Dr Everardo All, also for a kidney ultrasound  cONGRATS on weight loss.  Goal of 3 to 4 pounds per month  It is important that you exercise regularly at least 30 minutes 5 times a week. If you develop chest pain, have severe difficulty breathing, or feel very tired, stop exercising immediately and seek medical attention  Remember good cholesterol is low

## 2012-02-25 NOTE — Assessment & Plan Note (Signed)
Improved, no med change at this time 

## 2012-02-25 NOTE — Assessment & Plan Note (Signed)
Thyroid ultrasouind, free T3 and t4 and endo eval

## 2012-02-25 NOTE — Assessment & Plan Note (Signed)
Improved. Pt applauded on succesful weight loss through lifestyle change, and encouraged to continue same. Weight loss goal set for the next several months. Phentermine may be added after thyroid lab data obtained

## 2012-02-25 NOTE — Assessment & Plan Note (Signed)
Controlled, no change in medication  

## 2012-02-26 ENCOUNTER — Other Ambulatory Visit: Payer: Self-pay | Admitting: Family Medicine

## 2012-02-26 ENCOUNTER — Ambulatory Visit: Payer: PRIVATE HEALTH INSURANCE | Admitting: Family Medicine

## 2012-03-04 ENCOUNTER — Other Ambulatory Visit: Payer: Self-pay

## 2012-03-04 MED ORDER — PHENTERMINE HCL 37.5 MG PO TABS: 37.5000 mg | ORAL_TABLET | Freq: Every day | ORAL | Status: DC

## 2012-03-07 ENCOUNTER — Ambulatory Visit (HOSPITAL_COMMUNITY)
Admission: RE | Admit: 2012-03-07 | Discharge: 2012-03-07 | Disposition: A | Discharge status: Home / Self Care | Payer: PRIVATE HEALTH INSURANCE | Source: Ambulatory Visit | Attending: Family Medicine | Admitting: Family Medicine

## 2012-03-07 DIAGNOSIS — E349 Endocrine disorder, unspecified: Secondary | ICD-10-CM | POA: Insufficient documentation

## 2012-03-07 DIAGNOSIS — R809 Proteinuria, unspecified: Secondary | ICD-10-CM

## 2012-03-07 DIAGNOSIS — E042 Nontoxic multinodular goiter: Secondary | ICD-10-CM | POA: Insufficient documentation

## 2012-03-07 DIAGNOSIS — R7989 Other specified abnormal findings of blood chemistry: Secondary | ICD-10-CM

## 2012-03-09 ENCOUNTER — Other Ambulatory Visit: Payer: Self-pay | Admitting: Family Medicine

## 2012-03-09 DIAGNOSIS — E041 Nontoxic single thyroid nodule: Secondary | ICD-10-CM

## 2012-03-14 ENCOUNTER — Encounter: Payer: Self-pay | Admitting: Endocrinology

## 2012-03-14 ENCOUNTER — Ambulatory Visit (INDEPENDENT_AMBULATORY_CARE_PROVIDER_SITE_OTHER): Payer: PRIVATE HEALTH INSURANCE | Admitting: Endocrinology

## 2012-03-14 VITALS — BP 132/82 | HR 98 | Temp 97.0°F | Ht 60.0 in | Wt 250.0 lb

## 2012-03-14 DIAGNOSIS — R7989 Other specified abnormal findings of blood chemistry: Secondary | ICD-10-CM

## 2012-03-14 DIAGNOSIS — N2581 Secondary hyperparathyroidism of renal origin: Secondary | ICD-10-CM

## 2012-03-14 DIAGNOSIS — R6889 Other general symptoms and signs: Secondary | ICD-10-CM

## 2012-03-14 MED ORDER — LEVOTHYROXINE SODIUM 50 MCG PO TABS: 50.0000 ug | ORAL_TABLET | Freq: Every day | ORAL | Status: DC

## 2012-03-14 MED ORDER — CALCITRIOL 0.5 MCG PO CAPS: 1.0000 ug | ORAL_CAPSULE | Freq: Every day | ORAL | Status: DC

## 2012-03-14 NOTE — Progress Notes (Signed)
Subjective:    Patient ID: Sabrina [name], female    DOB: [birth year], 51 y.o.   MRN: [id number]  HPI Pt returns to f/u secondary hyperparathyroidism (dx'ed 2009).   No reason was found for this dx.  It was also never determined why her labs were resistant to 25-OH-vit-d supplementation.  She now takes rocaltrol intermittently only.  She denies cramps and numbness.  Past Medical History  Diagnosis Date  . Thyroid disease   . Hypertension   . Allergy   . Diabetes mellitus   . Vitamin d deficiency   . Obesity   . Secondary hyperthyroidism   . Hypersomnia with sleep apnea     Past Surgical History  Procedure Date  . Abdominal hysterectomy     partial  . Breast surgery     reduction  . Total abdominal hysterectomy w/ bilateral salpingoophorectomy     History   Social History  . Marital Status: Married    Spouse Name: [name]    Number of Children: [name]  . Years of Education: [name]   Occupational History  . Not on file.   Social History Main Topics  . Smoking status: Never Smoker   . Smokeless tobacco: Not on file  . Alcohol Use: No  . Drug Use: No  . Sexually Active: Not on file   Other Topics Concern  . Not on file   Social History Narrative  . No narrative on file    Current Outpatient Prescriptions on File Prior to Visit  Medication Sig Dispense Refill  . amLODipine (NORVASC) 5 MG tablet TAKE ONE TABLET BY MOUTH EVERY DAY  30 tablet  5  . beclomethasone (QVAR) 40 MCG/ACT inhaler Inhale 2 puffs into the lungs 2 (two) times daily.  1 Inhaler  2  . calcitRIOL (ROCALTROL) 0.5 MCG capsule Take 1 mcg by mouth daily.        . ergocalciferol (VITAMIN D2) 50000 UNITS capsule Take 1 capsule (50,000 Units total) by mouth once a week. One capsule once weekly  12 capsule  1  . fluticasone (FLONASE) 50 MCG/ACT nasal spray Place 2 sprays into the nose daily.  16 g  2  . loratadine (CLARITIN) 10 MG tablet Take 1 tablet (10 mg total) by mouth daily.  90 tablet  1  .  metFORMIN (GLUCOPHAGE XR) 750 MG 24 hr tablet Take TWO tablets daily with breakfast. Dose increase effective 06/26/2011  60 tablet  11  . phentermine (ADIPEX-P) 37.5 MG tablet Take 1 tablet (37.5 mg total) by mouth daily before breakfast.  30 tablet  3  . phentermine 37.5 MG capsule Take 1 capsule (37.5 mg total) by mouth every morning.  30 capsule  1  . potassium chloride SA (K-DUR,KLOR-CON) 20 MEQ tablet Take 20 mEq by mouth 2 (two) times daily.          Allergies  Allergen Reactions  . Codeine     Causes nausea    Family History  Problem Relation Age of Onset  . Hypertension Mother   . Diabetes Father   . Stroke Father   . Hypertension Sister     x 2  no thyroid probs  There were no vitals taken for this visit.    Review of Systems Denies diarrhea.     Objective:   Physical Exam VITAL SIGNS:  See vs page GENERAL: no distress NECK: There is no palpable thyroid enlargement.  No thyroid nodule is palpable.  No palpable lymphadenopathy at the anterior neck.  Lab Results  Component Value Date   WBC 12.3* 02/22/2012   HGB 12.0 02/22/2012   HCT 35.2* 02/22/2012   PLT 467* 02/22/2012   GLUCOSE 101* 02/22/2012   CHOL 153 02/22/2012   TRIG 101 02/22/2012   HDL 35* 02/22/2012   LDLCALC 98 02/22/2012   ALT 9 02/22/2012   AST 14 02/22/2012   NA 138 02/22/2012   K 3.8 02/22/2012   CL 99 02/22/2012   CREATININE 0.92 02/22/2012   BUN 15 02/22/2012   CO2 31 02/22/2012   TSH 6.542* 02/22/2012   HGBA1C 6.3* 02/22/2012   MICROALBUR 23.21* 02/22/2012      Assessment & Plan:  Hypothyroidism, mild Secondary hyperparathyroidism, uncertain etiology.  therapy limited by noncompliance.  i've advised pt she needs to resume rocaltrol.

## 2012-03-14 NOTE — Patient Instructions (Addendum)
Take your calcitriol daily, every day.   Also, please start taking a thyroid pill daily.   i have sent these 2 prescriptions to your pharmacy. Please come back to the lab to recheck your blood tests in 1 month.  You will receive a letter with results. Please come back for a follow-up appointment in 6 months.

## 2012-04-03 ENCOUNTER — Ambulatory Visit (INDEPENDENT_AMBULATORY_CARE_PROVIDER_SITE_OTHER): Payer: PRIVATE HEALTH INSURANCE | Admitting: Otolaryngology

## 2012-04-04 ENCOUNTER — Institutional Professional Consult (permissible substitution): Payer: PRIVATE HEALTH INSURANCE | Admitting: Pulmonary Disease

## 2012-04-17 ENCOUNTER — Ambulatory Visit (INDEPENDENT_AMBULATORY_CARE_PROVIDER_SITE_OTHER): Payer: PRIVATE HEALTH INSURANCE | Admitting: Otolaryngology

## 2012-04-17 DIAGNOSIS — D449 Neoplasm of uncertain behavior of unspecified endocrine gland: Secondary | ICD-10-CM

## 2012-04-18 ENCOUNTER — Other Ambulatory Visit: Payer: Self-pay | Admitting: Otolaryngology

## 2012-04-18 DIAGNOSIS — E041 Nontoxic single thyroid nodule: Secondary | ICD-10-CM

## 2012-04-25 ENCOUNTER — Institutional Professional Consult (permissible substitution): Payer: PRIVATE HEALTH INSURANCE | Admitting: Pulmonary Disease

## 2012-04-29 ENCOUNTER — Ambulatory Visit (HOSPITAL_COMMUNITY)
Admission: RE | Admit: 2012-04-29 | Discharge: 2012-04-29 | Disposition: A | Discharge status: Home / Self Care | Payer: PRIVATE HEALTH INSURANCE | Source: Ambulatory Visit | Attending: Otolaryngology | Admitting: Otolaryngology

## 2012-04-29 ENCOUNTER — Other Ambulatory Visit: Payer: Self-pay | Admitting: Otolaryngology

## 2012-04-29 DIAGNOSIS — E041 Nontoxic single thyroid nodule: Secondary | ICD-10-CM

## 2012-04-29 DIAGNOSIS — E049 Nontoxic goiter, unspecified: Secondary | ICD-10-CM | POA: Insufficient documentation

## 2012-04-29 NOTE — Progress Notes (Signed)
Lidocaine 2%          1mL injected                 Left thyroid biopsy performed  

## 2012-04-29 NOTE — Procedures (Signed)
PreOperative Dx: LEFT isthmic thyroid nodule Postoperative Dx: LEFT isthmic thyroid nodule Procedure:   US guided FNA LEFT isthmic thyroid nodule Radiologist:  Tyron Russell Anesthesia:  2 ml of 2% lidocaine Specimen:  FNA x 3 EBL:   < 1 ml Complications: None

## 2012-05-09 DIAGNOSIS — D485 Neoplasm of uncertain behavior of skin: Secondary | ICD-10-CM | POA: Insufficient documentation

## 2012-05-30 ENCOUNTER — Other Ambulatory Visit: Payer: Self-pay | Admitting: Family Medicine

## 2012-05-30 ENCOUNTER — Telehealth: Payer: Self-pay | Admitting: Family Medicine

## 2012-05-30 ENCOUNTER — Other Ambulatory Visit: Payer: Self-pay

## 2012-05-30 MED ORDER — SITAGLIPTIN PHOSPHATE 100 MG PO TABS: 100.0000 mg | ORAL_TABLET | Freq: Every day | ORAL | Status: DC

## 2012-05-30 NOTE — Telephone Encounter (Signed)
Patient aware and will come by on Monday to collect coupon.

## 2012-05-30 NOTE — Telephone Encounter (Signed)
I left a message for the pt to call.  Info from [facility] dermatology suggests she has a drug erruption due to metformin.  She needs to stop this, in its place I have entered [place] 100mg  one daily, she should collect a coupon this will decrease the copay, please fax the med after you speak with her

## 2012-06-03 ENCOUNTER — Telehealth: Payer: Self-pay | Admitting: Family Medicine

## 2012-06-03 NOTE — Telephone Encounter (Signed)
Called patient and left message for them to return call at the office   

## 2012-06-09 ENCOUNTER — Telehealth: Payer: Self-pay | Admitting: Family Medicine

## 2012-06-09 NOTE — Telephone Encounter (Signed)
Patient aware that the med Januvia has been approved

## 2012-07-18 ENCOUNTER — Other Ambulatory Visit: Payer: Self-pay | Admitting: Family Medicine

## 2012-07-18 DIAGNOSIS — Z139 Encounter for screening, unspecified: Secondary | ICD-10-CM

## 2012-07-23 ENCOUNTER — Inpatient Hospital Stay (HOSPITAL_COMMUNITY): Admission: RE | Admit: 2012-07-23 | Payer: PRIVATE HEALTH INSURANCE | Source: Ambulatory Visit

## 2012-07-29 ENCOUNTER — Ambulatory Visit (HOSPITAL_COMMUNITY): Payer: PRIVATE HEALTH INSURANCE

## 2012-08-19 ENCOUNTER — Ambulatory Visit (HOSPITAL_COMMUNITY)
Admission: RE | Admit: 2012-08-19 | Discharge: 2012-08-19 | Disposition: A | Discharge status: Home / Self Care | Payer: PRIVATE HEALTH INSURANCE | Source: Ambulatory Visit | Attending: Family Medicine | Admitting: Family Medicine

## 2012-08-19 DIAGNOSIS — Z1231 Encounter for screening mammogram for malignant neoplasm of breast: Secondary | ICD-10-CM | POA: Insufficient documentation

## 2012-08-19 DIAGNOSIS — Z139 Encounter for screening, unspecified: Secondary | ICD-10-CM

## 2012-09-02 ENCOUNTER — Encounter: Payer: Self-pay | Admitting: Certified Nurse Midwife

## 2012-09-02 DIAGNOSIS — E079 Disorder of thyroid, unspecified: Secondary | ICD-10-CM

## 2012-09-04 ENCOUNTER — Ambulatory Visit: Payer: PRIVATE HEALTH INSURANCE | Admitting: Family Medicine

## 2012-09-04 ENCOUNTER — Telehealth: Payer: Self-pay | Admitting: Family Medicine

## 2012-09-04 DIAGNOSIS — E119 Type 2 diabetes mellitus without complications: Secondary | ICD-10-CM

## 2012-09-05 MED ORDER — AMLODIPINE BESYLATE 5 MG PO TABS: ORAL_TABLET | ORAL | Status: DC

## 2012-09-05 NOTE — Telephone Encounter (Signed)
Patient aware, lab sent

## 2012-09-08 ENCOUNTER — Ambulatory Visit (INDEPENDENT_AMBULATORY_CARE_PROVIDER_SITE_OTHER): Payer: PRIVATE HEALTH INSURANCE | Admitting: Family Medicine

## 2012-09-08 ENCOUNTER — Encounter: Payer: Self-pay | Admitting: Family Medicine

## 2012-09-08 VITALS — BP 120/80 | HR 104 | Resp 16 | Ht 61.0 in | Wt 243.8 lb

## 2012-09-08 DIAGNOSIS — E119 Type 2 diabetes mellitus without complications: Secondary | ICD-10-CM

## 2012-09-08 DIAGNOSIS — J309 Allergic rhinitis, unspecified: Secondary | ICD-10-CM

## 2012-09-08 DIAGNOSIS — R6889 Other general symptoms and signs: Secondary | ICD-10-CM

## 2012-09-08 DIAGNOSIS — I10 Essential (primary) hypertension: Secondary | ICD-10-CM

## 2012-09-08 DIAGNOSIS — Z6841 Body Mass Index (BMI) 40.0 and over, adult: Secondary | ICD-10-CM

## 2012-09-08 DIAGNOSIS — R7989 Other specified abnormal findings of blood chemistry: Secondary | ICD-10-CM

## 2012-09-08 DIAGNOSIS — E559 Vitamin D deficiency, unspecified: Secondary | ICD-10-CM

## 2012-09-08 DIAGNOSIS — E039 Hypothyroidism, unspecified: Secondary | ICD-10-CM

## 2012-09-08 NOTE — Patient Instructions (Addendum)
F/U in 4 month, call if you need me before  Congrats on good lab and 20 pound weight loss in the past 14 month  It is important that you exercise regularly at least 30 minutes 5 times a week. If you develop chest pain, have severe difficulty breathing, or feel very tired, stop exercising immediately and seek medical attention    A healthy diet is rich in fruit, vegetables and whole grains. Poultry fish, nuts and beans are a healthy choice for protein rather then red meat. A low sodium diet and drinking 64 ounces of water daily is generally recommended. Oils and sweet should be limited. Carbohydrates especially for those who are diabetic or overweight, should be limited to 30-45 gram per meal. It is important to eat on a regular schedule, at least 3 times daily. Snacks should be primarily fruits, vegetables or nuts.  Weight loss goal of 4 pounds per month  Take half phentermine every day  Fasting chem 7, HBA1C in 4 month

## 2012-09-08 NOTE — Assessment & Plan Note (Signed)
Controlled, no change in medication DASH diet and commitment to daily physical activity for a minimum of 30 minutes discussed and encouraged, as a part of hypertension management. The importance of attaining a healthy weight is also discussed.  

## 2012-09-08 NOTE — Assessment & Plan Note (Signed)
Discontinue januvia on completion of current script. Lifestyle modification only

## 2012-09-08 NOTE — Assessment & Plan Note (Signed)
No current flare will use oral med as needed

## 2012-09-08 NOTE — Progress Notes (Signed)
  Subjective:    Patient ID: Sabrina [name], female    DOB: [birth year], 52 y.o.   MRN: [id number]  HPI  The PT is here for follow up and re-evaluation of chronic medical conditions, medication management and review of any available recent lab and radiology data.  Preventive health is updated, specifically  Cancer screening and Immunization.   Questions or concerns regarding consultations or procedures which the PT has had in the interim are  addressed. The PT denies any adverse reactions to current medications since the last visit.  There are no new concerns.  There are no specific complaints      Review of Systems See HPI Denies recent fever or chills. Denies sinus pressure, nasal congestion, ear pain or sore throat. Denies chest congestion, productive cough or wheezing. Denies chest pains, palpitations and leg swelling Denies abdominal pain, nausea, vomiting,diarrhea or constipation.   Denies dysuria, frequency, hesitancy or incontinence. Denies joint pain, swelling and limitation in mobility. Denies headaches, seizures, numbness, or tingling. Denies depression, anxiety or insomnia. Denies skin break down or rash.        Objective:   Physical Exam  Patient alert and oriented and in no cardiopulmonary distress.  HEENT: No facial asymmetry, EOMI, no sinus tenderness,  oropharynx pink and moist.  Neck supple no adenopathy.  Chest: Clear to auscultation bilaterally.  CVS: S1, S2 no murmurs, no S3.  ABD: Soft non tender. Bowel sounds normal.  Ext: No edema  MS: Adequate ROM spine, shoulders, hips and knees.  Skin: Intact, no ulcerations or rash noted.  Psych: Good eye contact, normal affect. Memory intact not anxious or depressed appearing.  CNS: CN 2-12 intact, power, tone and sensation normal throughout.       Assessment & Plan:

## 2012-09-08 NOTE — Assessment & Plan Note (Signed)
Improved. Pt applauded on succesful weight loss through lifestyle change, and encouraged to continue same. Weight loss goal set for the next several months. Pt to take phentermine half daily, has been using one every 3 days

## 2012-09-08 NOTE — Assessment & Plan Note (Signed)
Updated lab in 4 month, no longer taking med reportedly

## 2012-09-10 ENCOUNTER — Other Ambulatory Visit: Payer: Self-pay | Admitting: Family Medicine

## 2012-10-23 ENCOUNTER — Other Ambulatory Visit: Payer: Self-pay

## 2012-10-23 MED ORDER — PHENTERMINE HCL 37.5 MG PO TABS: 37.5000 mg | ORAL_TABLET | Freq: Every day | ORAL | Status: DC

## 2012-10-30 ENCOUNTER — Ambulatory Visit (INDEPENDENT_AMBULATORY_CARE_PROVIDER_SITE_OTHER): Payer: PRIVATE HEALTH INSURANCE | Admitting: Certified Nurse Midwife

## 2012-10-30 ENCOUNTER — Encounter: Payer: Self-pay | Admitting: Certified Nurse Midwife

## 2012-10-30 VITALS — BP 138/74 | Ht 60.25 in | Wt 244.0 lb

## 2012-10-30 DIAGNOSIS — Z0000 Encounter for general adult medical examination without abnormal findings: Secondary | ICD-10-CM

## 2012-10-30 NOTE — Progress Notes (Addendum)
52 y.o. MarriedAfrican American female   G0P0000 here for annual exam.    Patient's last menstrual period was 10/31/2002.          Sexually active: yes  The current method of family planning is status post hysterectomy.    Exercising: yes  Home exercise routine includes walking hrs per day. Last mammogram:  07/13/2012 Smoking:no Alcohol:no Last colonoscopy:no Last Bone Density: 2006   Hgb:   pcp           Urine:pcp   reports that she has never smoked. She has never used smokeless tobacco. She reports that she does not drink alcohol or use illicit drugs.  Health Maintenance  Topic Date Due  . Influenza Vaccine  04/13/2012  . Tetanus/tdap  04/11/2014  . Pap Smear  10/09/2014    Family History  Problem Relation Age of Onset  . Hypertension Mother   . Diabetes Mother   . Diabetes Father   . Stroke Father   . Hypertension Sister     x 2  . Stroke Maternal Aunt     Patient Active Problem List  Diagnosis  . HYPOTHYROIDISM  . DIABETES MELLITUS, TYPE II  . UNSPECIFIED VITAMIN D DEFICIENCY  . Morbid obesity with BMI of 45.0-49.9, adult  . HYPERTENSION  . ALLERGIC RHINITIS CAUSE UNSPECIFIED  . SECONDARY HYPERPARATHYROIDISM  . HEADACHE  . HYPERSOMNIA, ASSOCIATED WITH SLEEP APNEA  . Abnormal TSH  . Proteinuria    Past Medical History  Diagnosis Date  . Thyroid disease   . Hypertension   . Allergy   . Diabetes mellitus   . Vitamin D deficiency   . Obesity   . Secondary hyperthyroidism   . Hypersomnia with sleep apnea   . Arthritis   . Chronic bronchitis   . Fibroid uterus   . Menorrhagia   . Hx of iron deficiency anemia   . Endometriosis     Past Surgical History  Procedure Laterality Date  . Abdominal hysterectomy  2004    supracervical hysterectomy  . Breast surgery  2005    reduction  . Dilation and curettage of uterus      age 25    Allergies: Codeine; Metformin and related; and Peanut-containing drug products  Current Outpatient  Prescriptions  Medication Sig Dispense Refill  . amLODipine (NORVASC) 5 MG tablet TAKE ONE TABLET BY MOUTH EVERY DAY  30 tablet  5  . fluticasone (FLONASE) 50 MCG/ACT nasal spray Place 2 sprays into the nose daily.  16 g  2  . levothyroxine (SYNTHROID, LEVOTHROID) 50 MCG tablet Take 1 tablet (50 mcg total) by mouth daily.  30 tablet  11  . phentermine (ADIPEX-P) 37.5 MG tablet Take 1 tablet (37.5 mg total) by mouth daily before breakfast.  30 tablet  1   No current facility-administered medications for this visit.    ROS: Pertinent items are noted in HPI.  PCP does all labs normal per patient  Exam:    BP 138/74  Ht 5' 0.25" (1.53 m)  Wt 244 lb (110.678 kg)  BMI 47.28 kg/m2  LMP 10/31/2002   Wt Readings from Last 3 Encounters:  10/30/12 244 lb (110.678 kg)  09/08/12 243 lb 12.8 oz (110.587 kg)  03/14/12 250 lb (113.399 kg)     Ht Readings from Last 3 Encounters:  10/30/12 5' 0.25" (1.53 m)  09/08/12 5\' 1"  (1.549 m)  03/14/12 5' (1.524 m)    General appearance: alert, cooperative and appears stated age Head: Normocephalic, without  obvious abnormality, atraumatic Neck: no adenopathy, supple, symmetrical, trachea midline and thyroid not enlarged, symmetric, no tenderness/mass/nodules Lungs: clear to auscultation bilaterally Breasts: Inspection negative, No nipple retraction or dimpling, No nipple discharge or bleeding, No axillary or supraclavicular adenopathy, Normal to palpation without dominant masses Heart: regular rate and rhythm Abdomen: soft, non-tender; bowel sounds normal; no masses,  no organomegaly Extremities: extremities normal, atraumatic, no cyanosis or edema Skin: Skin color, texture, turgor normal. No rashes or lesions Sporadic areas of hypopigmentation(under treatment with dermatology) Lymph nodes: Cervical, supraclavicular, and axillary nodes normal. No abnormal inguinal nodes palpated Neurologic: Grossly normal   Pelvic: External genitalia:  no lesions   Decreased pigmentation outside vulva crease bilaterally              Urethra:  normal appearing urethra with no masses, tenderness or lesions              Bartholins and Skenes: normal               Uterus absent                Vagina: normal appearing vagina with normal color and discharge, no lesions              Cervix: normal appearance              Pap taken: no        Bimanual Exam:  Uterus: absent                                      Adnexa absent  No masses palpated                                      Rectovaginal: Confirms                                      Anus:  normal sphincter tone, no lesions  A: Well Woman  Exam Hypothyroid stable medicine Type II Diabetes medication no longer needed Hypertension stable on med TAH supercervical due to fibroids     P: mammogram yearly pap smear due next year Colonoscopy due 12/14 risks and benefits discussed plans schedule with PCP return annually or prn    Instructions given to patient regarding health and wellness  An After Visit Summary was printed and given to the patient.   Reviewed, TL

## 2013-02-09 ENCOUNTER — Telehealth: Payer: Self-pay

## 2013-02-09 DIAGNOSIS — I10 Essential (primary) hypertension: Secondary | ICD-10-CM

## 2013-02-09 DIAGNOSIS — E039 Hypothyroidism, unspecified: Secondary | ICD-10-CM

## 2013-02-09 DIAGNOSIS — E119 Type 2 diabetes mellitus without complications: Secondary | ICD-10-CM

## 2013-02-09 DIAGNOSIS — E559 Vitamin D deficiency, unspecified: Secondary | ICD-10-CM

## 2013-02-09 NOTE — Telephone Encounter (Signed)
pls schedule appt for pt to be seen next week , as this is the soonest available appt   (I tried to speak directly with her and had t leave her a message)   Please also explain that if she has ongoing chest pain , this may be a medical emergency and she should go to the urgent care or to the ED, chest pain cannot be diagnosed with a phone call only

## 2013-02-10 NOTE — Telephone Encounter (Signed)
Noted that patient has appt for 7/7.  Labs ordered and faxed to [facility].

## 2013-02-10 NOTE — Telephone Encounter (Signed)
pls fax in lab order for CBC, fasting lipid, cmp and EGFR, hBA1C , TSH and vit D pt will do labs this week before appt next week

## 2013-02-10 NOTE — Addendum Note (Signed)
Addended by: Kandis Fantasia B on: 02/10/2013 02:38 PM   Modules accepted: Orders

## 2013-02-11 ENCOUNTER — Emergency Department (HOSPITAL_COMMUNITY): Payer: BC Managed Care – PPO

## 2013-02-11 ENCOUNTER — Emergency Department (HOSPITAL_COMMUNITY)
Admission: EM | Admit: 2013-02-11 | Discharge: 2013-02-11 | Disposition: A | Discharge status: Home / Self Care | Payer: BC Managed Care – PPO | Attending: Emergency Medicine | Admitting: Emergency Medicine

## 2013-02-11 ENCOUNTER — Telehealth: Payer: Self-pay | Admitting: Family Medicine

## 2013-02-11 ENCOUNTER — Encounter (HOSPITAL_COMMUNITY): Payer: Self-pay | Admitting: *Deleted

## 2013-02-11 DIAGNOSIS — Z8739 Personal history of other diseases of the musculoskeletal system and connective tissue: Secondary | ICD-10-CM | POA: Insufficient documentation

## 2013-02-11 DIAGNOSIS — Z79899 Other long term (current) drug therapy: Secondary | ICD-10-CM | POA: Insufficient documentation

## 2013-02-11 DIAGNOSIS — Z8639 Personal history of other endocrine, nutritional and metabolic disease: Secondary | ICD-10-CM | POA: Insufficient documentation

## 2013-02-11 DIAGNOSIS — M7989 Other specified soft tissue disorders: Secondary | ICD-10-CM | POA: Insufficient documentation

## 2013-02-11 DIAGNOSIS — R071 Chest pain on breathing: Secondary | ICD-10-CM | POA: Insufficient documentation

## 2013-02-11 DIAGNOSIS — Z862 Personal history of diseases of the blood and blood-forming organs and certain disorders involving the immune mechanism: Secondary | ICD-10-CM | POA: Insufficient documentation

## 2013-02-11 DIAGNOSIS — Z8742 Personal history of other diseases of the female genital tract: Secondary | ICD-10-CM | POA: Insufficient documentation

## 2013-02-11 DIAGNOSIS — E876 Hypokalemia: Secondary | ICD-10-CM

## 2013-02-11 DIAGNOSIS — R0789 Other chest pain: Secondary | ICD-10-CM

## 2013-02-11 DIAGNOSIS — E119 Type 2 diabetes mellitus without complications: Secondary | ICD-10-CM

## 2013-02-11 DIAGNOSIS — R05 Cough: Secondary | ICD-10-CM | POA: Insufficient documentation

## 2013-02-11 DIAGNOSIS — R059 Cough, unspecified: Secondary | ICD-10-CM | POA: Insufficient documentation

## 2013-02-11 DIAGNOSIS — E669 Obesity, unspecified: Secondary | ICD-10-CM | POA: Insufficient documentation

## 2013-02-11 DIAGNOSIS — E039 Hypothyroidism, unspecified: Secondary | ICD-10-CM

## 2013-02-11 DIAGNOSIS — Z91040 Latex allergy status: Secondary | ICD-10-CM | POA: Insufficient documentation

## 2013-02-11 DIAGNOSIS — I10 Essential (primary) hypertension: Secondary | ICD-10-CM | POA: Insufficient documentation

## 2013-02-11 DIAGNOSIS — J9819 Other pulmonary collapse: Secondary | ICD-10-CM | POA: Insufficient documentation

## 2013-02-11 DIAGNOSIS — R109 Unspecified abdominal pain: Secondary | ICD-10-CM

## 2013-02-11 DIAGNOSIS — J9811 Atelectasis: Secondary | ICD-10-CM

## 2013-02-11 DIAGNOSIS — E0590 Thyrotoxicosis, unspecified without thyrotoxic crisis or storm: Secondary | ICD-10-CM | POA: Insufficient documentation

## 2013-02-11 LAB — COMPREHENSIVE METABOLIC PANEL
ALT: 6 U/L (ref 0–35)
BUN: 13 mg/dL (ref 6–23)
Calcium: 8 mg/dL — ABNORMAL LOW (ref 8.4–10.5)
Creatinine, Ser: 0.94 mg/dL (ref 0.50–1.10)
GFR calc Af Amer: 81 mL/min — ABNORMAL LOW (ref >90)
Glucose, Bld: 117 mg/dL — ABNORMAL HIGH (ref 70–99)
Sodium: 140 mEq/L (ref 135–145)
Total Protein: 8.1 g/dL (ref 6.0–8.3)

## 2013-02-11 LAB — CBC WITH DIFFERENTIAL/PLATELET
Eosinophils Absolute: 0.5 10*3/uL (ref 0.0–0.7)
Eosinophils Relative: 4 % (ref 0–5)
HCT: 34.9 % — ABNORMAL LOW (ref 36.0–46.0)
Lymphs Abs: 2.5 10*3/uL (ref 0.7–4.0)
MCH: 27 pg (ref 26.0–34.0)
MCV: 81.2 fL (ref 78.0–100.0)
Monocytes Absolute: 0.9 10*3/uL (ref 0.1–1.0)
Platelets: 364 10*3/uL (ref 150–400)
RBC: 4.3 MIL/uL (ref 3.87–5.11)

## 2013-02-11 LAB — TROPONIN I: Troponin I: <0.3 ng/mL (ref <0.30)

## 2013-02-11 LAB — D-DIMER, QUANTITATIVE: D-Dimer, Quant: 0.85 ug/mL-FEU — ABNORMAL HIGH (ref 0.00–0.48)

## 2013-02-11 LAB — LIPASE, BLOOD: Lipase: 72 U/L — ABNORMAL HIGH (ref 11–59)

## 2013-02-11 MED ORDER — ONDANSETRON HCL 4 MG PO TABS: 4.0000 mg | ORAL_TABLET | Freq: Three times a day (TID) | ORAL | Status: DC | PRN

## 2013-02-11 MED ORDER — SODIUM CHLORIDE 0.9 % IV SOLN
Freq: Once | INTRAVENOUS | Status: AC
  Administered 2013-02-11: 09:00:00 via INTRAVENOUS

## 2013-02-11 MED ORDER — IOHEXOL 350 MG/ML SOLN
100.0000 mL | Freq: Once | INTRAVENOUS | Status: AC | PRN
  Administered 2013-02-11: 100 mL via INTRAVENOUS

## 2013-02-11 MED ORDER — TRAMADOL HCL 50 MG PO TABS: 100.0000 mg | ORAL_TABLET | Freq: Four times a day (QID) | ORAL | Status: DC | PRN

## 2013-02-11 MED ORDER — NAPROXEN 500 MG PO TABS: 500.0000 mg | ORAL_TABLET | Freq: Two times a day (BID) | ORAL | Status: DC | PRN

## 2013-02-11 MED ORDER — AZITHROMYCIN 250 MG PO TABS: 250.0000 mg | ORAL_TABLET | Freq: Every day | ORAL | Status: DC

## 2013-02-11 MED ORDER — MORPHINE SULFATE 4 MG/ML IJ SOLN
4.0000 mg | Freq: Once | INTRAMUSCULAR | Status: AC
  Administered 2013-02-11: 4 mg via INTRAVENOUS
  Filled 2013-02-11: qty 1

## 2013-02-11 MED ORDER — POTASSIUM CHLORIDE CRYS ER 20 MEQ PO TBCR
40.0000 meq | EXTENDED_RELEASE_TABLET | Freq: Once | ORAL | Status: AC
  Administered 2013-02-11: 40 meq via ORAL
  Filled 2013-02-11: qty 2

## 2013-02-11 MED ORDER — IOHEXOL 350 MG/ML SOLN
80.0000 mL | Freq: Once | INTRAVENOUS | Status: AC | PRN
  Administered 2013-02-11: 80 mL via INTRAVENOUS

## 2013-02-11 MED ORDER — METHOCARBAMOL 500 MG PO TABS: 1000.0000 mg | ORAL_TABLET | Freq: Four times a day (QID) | ORAL | Status: DC | PRN

## 2013-02-11 MED ORDER — ONDANSETRON HCL 4 MG/2ML IJ SOLN
4.0000 mg | Freq: Once | INTRAMUSCULAR | Status: AC
  Administered 2013-02-11: 4 mg via INTRAVENOUS
  Filled 2013-02-11: qty 2

## 2013-02-11 MED ORDER — POTASSIUM CHLORIDE CRYS ER 20 MEQ PO TBCR: 20.0000 meq | EXTENDED_RELEASE_TABLET | Freq: Two times a day (BID) | ORAL | Status: DC

## 2013-02-11 NOTE — ED Notes (Signed)
Pt states right-sided CP, radiating to back which began Sunday and was intermittent since Monday. Since Monday it has mostly been continuous. Pt stats pain is worse with movement or with cough.

## 2013-02-11 NOTE — ED Provider Notes (Signed)
History  This chart was scribed for Ward Givens, MD by Bennett Scrape, ED Scribe. This patient was seen in room APA18/AP[facility]18 and the patient's care was started at 8:23 AM.  CSN: [id number]  Arrival date & time 02/11/13  0750   First MD Initiated Contact with Patient 02/11/13 [phone]     Chief Complaint  Patient presents with  . Chest Pain    Patient is a 52 y.o. female presenting with chest pain.  Chest Pain Pain location:  R chest Pain radiates to the back: yes   Onset quality:  Gradual Duration:  3 days Progression:  Improving Chronicity:  New Relieved by: acteminophen. Worsened by:  Coughing and deep breathing Associated symptoms: no cough, no diaphoresis, no fever, no nausea, no shortness of breath and not vomiting   Risk factors: no diabetes mellitus, no hypertension and no smoking     HPI Comments: Sabrina [name] is a 52 y.o. female who presents to the Emergency Department complaining of gradual onset central CP described as  that radiates intermittently to the right chest and right flank area that started intermittently 4 days ago but became constant 3 days ago. She rates her pain a 7 out of 10 currently and 10 out of 10 at its worse 2 days ago and yesterday. She reports that the pain is occasionally aggravated by coughing and deep breathing. She states that taking ibuprofen mildly improved her pain. Pt denies having prior episodes of similar symptoms. She works at [employer] and admits that her responsibilities at work have been increased recently. She reports ongoing bilateral leg swelling at night for a long time but denies increased cough, sore throat, chills, fever, nausea and rhinorrhea as associated symptoms. Father had a h/o DM and CVA who died at age 64 and mother, age 96, has a h/o irregular heart beat. She denies a family h/o MI. She has a h/o HTN, DM and she denies smoking and alcohol use.   PCP is Dr. Lodema Hong.  Past Medical History   Diagnosis Date  . Thyroid disease   . Hypertension   . Allergy   . Diabetes mellitus   . Vitamin D deficiency   . Obesity   . Secondary hyperthyroidism   . Hypersomnia with sleep apnea   . Arthritis   . Chronic bronchitis   . Fibroid uterus   . Menorrhagia   . Hx of iron deficiency anemia   . Endometriosis    Past Surgical History  Procedure Laterality Date  . Abdominal hysterectomy  2004    supracervical hysterectomy  . Breast surgery  2005    reduction  . Dilation and curettage of uterus      age 44   Family History  Problem Relation Age of Onset  . Hypertension Mother   . Diabetes Mother   . Diabetes Father   . Stroke Father   . Hypertension Sister     x 2  . Stroke Maternal Aunt    History  Substance Use Topics  . Smoking status: Never Smoker   . Smokeless tobacco: Never Used  . Alcohol Use: No  works at [employer]  OB History   Grav Para Term Preterm Abortions TAB SAB Ect Mult Living   0 0 0 0 0 0 0 0 0 0      Review of Systems  Constitutional: Negative for fever, chills and diaphoresis.  HENT: Negative for sore throat.   Respiratory: Negative for cough and shortness of  breath.   Cardiovascular: Positive for chest pain and leg swelling (ongoing issue).  Gastrointestinal: Negative for nausea, vomiting and diarrhea.  All other systems reviewed and are negative.    Allergies  Codeine; Peanut-containing drug products; and Latex  Home Medications   Current Outpatient Rx  Name  Route  Sig  Dispense  Refill  . amLODipine (NORVASC) 5 MG tablet      TAKE ONE TABLET BY MOUTH EVERY DAY   30 tablet   5   . fluticasone (FLONASE) 50 MCG/ACT nasal spray   Nasal   Place 2 sprays into the nose daily.   16 g   2   . levothyroxine (SYNTHROID, LEVOTHROID) 50 MCG tablet   Oral   Take 1 tablet (50 mcg total) by mouth daily.   30 tablet   11   . phentermine (ADIPEX-P) 37.5 MG tablet   Oral   Take 1 tablet (37.5 mg total) by mouth  daily before breakfast.   30 tablet   1    Triage Vitals: BP 148/90  Temp(Src) 99.4 F (37.4 C) (Oral)  Resp 16  Ht 5' (1.524 m)  Wt 230 lb (104.327 kg)  BMI 44.92 kg/m2  SpO2 94%  LMP 10/31/2002  Vital signs normal    Physical Exam  Nursing note and vitals reviewed. Constitutional: She is oriented to person, place, and time. She appears well-developed and well-nourished.  Non-toxic appearance. She does not appear ill. No distress.  PAin with changing positions  HENT:  Head: Normocephalic and atraumatic.  Right Ear: External ear normal.  Left Ear: External ear normal.  Nose: Nose normal. No mucosal edema or rhinorrhea.  Mouth/Throat: Oropharynx is clear and moist and mucous membranes are normal. No dental abscesses or edematous.  Eyes: Conjunctivae and EOM are normal. Pupils are equal, round, and reactive to light.  Neck: Normal range of motion and full passive range of motion without pain. Neck supple.  Cardiovascular: Normal rate, regular rhythm and normal heart sounds.  Exam reveals no gallop and no friction rub.   No murmur heard. Pulmonary/Chest: Effort normal and breath sounds normal. No respiratory distress. She has no wheezes. She has no rhonchi. She has no rales. She exhibits tenderness (right anterior chest wall). She exhibits no crepitus.  Abdominal: Soft. Normal appearance and bowel sounds are normal. She exhibits no distension. There is tenderness (RUQ). There is no rebound and no guarding.    Area of pain noted  Musculoskeletal: Normal range of motion. She exhibits no edema and no tenderness.  Legs and feet looked swollen bilaterally but had no pitting edema  Neurological: She is alert and oriented to person, place, and time. She has normal strength. No cranial nerve deficit.  Skin: Skin is warm, dry and intact. No rash noted. No erythema. No pallor.  Psychiatric: She has a normal mood and affect. Her speech is normal and behavior is normal. Her mood appears not  anxious.    ED Course  Procedures (including critical care time)  Medications  0.9 %  sodium chloride infusion ( Intravenous Stopped 02/11/13 1208)  morphine 4 MG/ML injection 4 mg (4 mg Intravenous Given 02/11/13 0926)  ondansetron (ZOFRAN) injection 4 mg (4 mg Intravenous Given 02/11/13 0926)  iohexol (OMNIPAQUE) 350 MG/ML injection 100 mL (100 mLs Intravenous Contrast Given 02/11/13 1029)  iohexol (OMNIPAQUE) 350 MG/ML injection 80 mL (80 mLs Intravenous Contrast Given 02/11/13 1100)  potassium chloride SA (K-DUR,KLOR-CON) CR tablet 40 mEq (40 mEq Oral Given 02/11/13 1235)  DIAGNOSTIC STUDIES: Oxygen Saturation is 94% on room air, normal by my interpretation.    COORDINATION OF CARE: 8:43 AM-Discussed treatment plan which includes medications, CXR, CBC panel, CMP and d-dimer with pt at bedside and pt agreed to plan.   9:50 AM- Pt rechecked and reports improved in pain with medications.  Informed pt of elevated d-dimer and other results. Discussed treatment plan of [place] and CT angio of chest with pt and pt agreed. She denies having a family h/o DVTs/ PEs.  11:28 AM-Pt rechecked and feels improved with medications listed above. She rates her pain a 2 out of 10 currently. Informed pt of negative radiology work for gall stones and DVTs/PE. Will send home with antibiotics for possible developing PNA seen on CXR. Will also discharge with pain medication. Advised pt to avoid anything fried, spicy or greasy and to use a liquid diet.   Results for orders placed during the hospital encounter of 02/11/13  CBC WITH DIFFERENTIAL      Result Value Range   WBC 12.0 (*) 4.0 - 10.5 K/uL   RBC 4.30  3.87 - 5.11 MIL/uL   Hemoglobin 11.6 (*) 12.0 - 15.0 g/dL   HCT 78.2 (*) 95.6 - 21.3 %   MCV 81.2  78.0 - 100.0 fL   MCH 27.0  26.0 - 34.0 pg   MCHC 33.2  30.0 - 36.0 g/dL   RDW 08.6  57.8 - 46.9 %   Platelets 364  150 - 400 K/uL   Neutrophils Relative % 68  43 - 77 %   Neutro Abs 8.2 (*) 1.7 - 7.7 K/uL    Lymphocytes Relative 21  12 - 46 %   Lymphs Abs 2.5  0.7 - 4.0 K/uL   Monocytes Relative 7  3 - 12 %   Monocytes Absolute 0.9  0.1 - 1.0 K/uL   Eosinophils Relative 4  0 - 5 %   Eosinophils Absolute 0.5  0.0 - 0.7 K/uL   Basophils Relative 0  0 - 1 %   Basophils Absolute 0.0  0.0 - 0.1 K/uL  COMPREHENSIVE METABOLIC PANEL      Result Value Range   Sodium 140  135 - 145 mEq/L   Potassium 3.0 (*) 3.5 - 5.1 mEq/L   Chloride 99  96 - 112 mEq/L   CO2 32  19 - 32 mEq/L   Glucose, Bld 117 (*) 70 - 99 mg/dL   BUN 13  6 - 23 mg/dL   Creatinine, Ser 6.29  0.50 - 1.10 mg/dL   Calcium 8.0 (*) 8.4 - 10.5 mg/dL   Total Protein 8.1  6.0 - 8.3 g/dL   Albumin 3.2 (*) 3.5 - 5.2 g/dL   AST 17  0 - 37 U/L   ALT 6  0 - 35 U/L   Alkaline Phosphatase 62  39 - 117 U/L   Total Bilirubin 0.2 (*) 0.3 - 1.2 mg/dL   GFR calc non Af Amer 70 (*) >90 mL/min   GFR calc Af Amer 81 (*) >90 mL/min  LIPASE, BLOOD      Result Value Range   Lipase 72 (*) 11 - 59 U/L  TROPONIN I      Result Value Range   Troponin I <0.30  <0.30 ng/mL  D-DIMER, QUANTITATIVE      Result Value Range   D-Dimer, Quant 0.85 (*) 0.00 - 0.48 ug/mL-FEU   Laboratory interpretation all normal except elevated ddimer and lipase, hypokalemia  Dg Chest 2 View  02/11/2013   *RADIOLOGY REPORT*  Clinical Data: Chest pain for 3 days, hypertension and diabetes  CHEST - 2 VIEW  Comparison: Chest x-ray of 12/03/2008  Findings: No active infiltrate or effusion is seen.  The heart is borderline enlarged.  No acute bony abnormality is seen with minimal degenerative change of the thoracic spine.  IMPRESSION: Borderline cardiomegaly.  No active lung disease.   Original Report Authenticated By: Dwyane Dee, M.D.   Ct Angio Chest W/cm &/or Wo Cm  02/11/2013   *RADIOLOGY REPORT*  Clinical Data: Chest pain for several days, nausea, vomiting, diabetes, elevated D-dimer  CT ANGIOGRAPHY CHEST  Technique:  Multidetector CT imaging of the chest using the standard  protocol during bolus administration of intravenous contrast. Multiplanar reconstructed images including MIPs were obtained and reviewed to evaluate the vascular anatomy.  Contrast: OMNIPAQUE IOHEXOL 350 MG/ML SOLN, 80mL OMNIPAQUE IOHEXOL 350 MG/ML SOLN  Comparison: Chest x-ray of 02/11/2013  Findings: After second injection, the pulmonary arteries are moderately well opacified.  No pulmonary embolism is seen.  The thoracic aorta is well opacified on the initial series of images with no acute abnormality noted.  The origins of the great vessels appear patent.  No mediastinal or hilar adenopathy is seen.  On the lung window images, there is patchy parenchymal opacity primarily within the posterior medial left lower lobe suspicious for developing pneumonia.  Linear atelectasis or early pneumonia also is noted posterior medially within the right lower lobe.  No pleural effusion is seen.  The central airway is patent.  No bony abnormality is seen.  IMPRESSION:  1.  No acute pulmonary embolism is seen. 2.  Patchy opacity primarily in the posterior medial left lower lobe and to a lesser degree in the posterior right lower lobe suspicious for pneumonia and atelectasis.   Original Report Authenticated By: Dwyane Dee, M.D.   US Abdomen Limited Ruq  02/11/2013   *RADIOLOGY REPORT*  Clinical Data:  Gallstones  GALLBLADDER ULTRASOUND  Comparison:  None  Findings:  Gallbladder:  No gallstones, gallbladder wall thickening, or pericholecystic fluid.  Common Bile Duct:  Within normal limits in caliber.  Liver:  No focal lesion identified.  Within normal limits in parenchymal echogenicity.  IMPRESSION: Negative gallbladder ultrasound.   Original Report Authenticated By: Signa Kell, M.D.      Date: 02/11/2013  Rate: 89  Rhythm: normal sinus rhythm  QRS Axis: normal  Intervals: QT prolonged  ST/T Wave abnormalities: nonspecific ST/T changes  Conduction Disutrbances:none  Narrative Interpretation:   Old EKG  Reviewed: [facility] 04/10/2004    1. Chest wall pain   2. Abdominal pain   3. Atelectasis of right lung   4. Hypokalemia with normal acid-base balance     Discharge Medication List as of 02/11/2013 12:08 PM    START taking these medications   Details  azithromycin (ZITHROMAX) 250 MG tablet Take 1 tablet (250 mg total) by mouth daily. Take first 2 tablets together, then 1 every day until finished., Starting 02/11/2013, Until Discontinued, Print    methocarbamol (ROBAXIN) 500 MG tablet Take 2 tablets (1,000 mg total) by mouth 4 (four) times daily as needed (for muscle pain)., Starting 02/11/2013, Until Discontinued, Print    naproxen (NAPROSYN) 500 MG tablet Take 1 tablet (500 mg total) by mouth 2 (two) times daily as needed (chest pain)., Starting 02/11/2013, Until Discontinued, Print    ondansetron (ZOFRAN) 4 MG tablet Take 1 tablet (4 mg total) by mouth every 8 (eight) hours as needed for  nausea., Starting 02/11/2013, Until Discontinued, Print    traMADol (ULTRAM) 50 MG tablet Take 2 tablets (100 mg total) by mouth every 6 (six) hours as needed., Starting 02/11/2013, Until Discontinued, Print         Plan discharge   Devoria Albe, MD, FACEP    MDM    I personally performed the services described in this documentation, which was scribed in my presence. The recorded information has been reviewed and considered.  Devoria Albe, MD, [facility]    Ward Givens, MD 02/11/13 [phone]

## 2013-02-11 NOTE — ED Notes (Signed)
The patient states that she started having intermittent chest pain on Saturday 6/28 and it worsened the next day, points to her sternum when asked where the pain is.  States that her chest pain started radiating into her right back on Sunday.  Denies N/V/D, denies SOB, denies diarphoresis with the episodes.  States that she has a history of of reflux.

## 2013-02-11 NOTE — Telephone Encounter (Signed)
pls just add if possible hBA1C and TSH to labs drawn yesterday, she can have cholesterol checked at a later date fasting, no need for additional labs draw at this time

## 2013-02-12 LAB — HEMOGLOBIN A1C: Hgb A1c MFr Bld: 6 % — ABNORMAL HIGH (ref <5.7)

## 2013-02-12 NOTE — Telephone Encounter (Signed)
Spoke with brenda at [facility] and she stated that the labs can be added just as long as there is an order sent.  Additional labs ordered and requisition faxed to [facility].

## 2013-02-16 ENCOUNTER — Ambulatory Visit (INDEPENDENT_AMBULATORY_CARE_PROVIDER_SITE_OTHER): Payer: BC Managed Care – PPO | Admitting: Family Medicine

## 2013-02-16 ENCOUNTER — Encounter: Payer: Self-pay | Admitting: Family Medicine

## 2013-02-16 VITALS — BP 138/80 | HR 86 | Resp 16 | Ht 61.0 in | Wt 252.0 lb

## 2013-02-16 DIAGNOSIS — E039 Hypothyroidism, unspecified: Secondary | ICD-10-CM

## 2013-02-16 DIAGNOSIS — I10 Essential (primary) hypertension: Secondary | ICD-10-CM

## 2013-02-16 DIAGNOSIS — Z6841 Body Mass Index (BMI) 40.0 and over, adult: Secondary | ICD-10-CM

## 2013-02-16 DIAGNOSIS — R351 Nocturia: Secondary | ICD-10-CM | POA: Insufficient documentation

## 2013-02-16 DIAGNOSIS — E119 Type 2 diabetes mellitus without complications: Secondary | ICD-10-CM

## 2013-02-16 MED ORDER — SOLIFENACIN SUCCINATE 5 MG PO TABS: 10.0000 mg | ORAL_TABLET | Freq: Every day | ORAL | Status: DC

## 2013-02-16 MED ORDER — POTASSIUM CHLORIDE CRYS ER 20 MEQ PO TBCR: 20.0000 meq | EXTENDED_RELEASE_TABLET | Freq: Two times a day (BID) | ORAL | Status: DC

## 2013-02-16 MED ORDER — METFORMIN HCL 1000 MG PO TABS: 1000.0000 mg | ORAL_TABLET | Freq: Every day | ORAL | Status: DC

## 2013-02-16 MED ORDER — PHENTERMINE HCL 37.5 MG PO TABS: 37.5000 mg | ORAL_TABLET | Freq: Every day | ORAL | Status: DC

## 2013-02-16 NOTE — Patient Instructions (Addendum)
F/u in 4 month, call if you need me before  Weight loss goal of 3 pounds per month, phentermine daily to be re started  Resume metfomin 1 daily  New for urinary frequency at night is vesicare, call if not effective  Pls have dermatologist evaluate leg lesions  I am considering a rept chest scan in the next 6 to 12 month, depending on symptoms to ensure clear  TSH result will be sent to Dr Everardo All  Fasting lipid, cmp and EGFr, HBA1C in 4 month

## 2013-03-15 NOTE — Assessment & Plan Note (Signed)
Controlled, no change in medication  

## 2013-03-15 NOTE — Assessment & Plan Note (Signed)
Trial of med for urinary frequency

## 2013-03-15 NOTE — Assessment & Plan Note (Signed)
Controlled, no change in medication DASH diet and commitment to daily physical activity for a minimum of 30 minutes discussed and encouraged, as a part of hypertension management. The importance of attaining a healthy weight is also discussed.  

## 2013-03-15 NOTE — Progress Notes (Signed)
  Subjective:    Patient ID: Sabrina [name], female    DOB: 07-Nov-1960, 52 y.o.   MRN: [id number]  HPI Pt in for Ed f/u, she had called last week with chest pain, elected to be evaluated in the Ed when symptoms worsened, diagnosed with pneumonia form chest scan, she denies fever, chills sputum or productive cough. States however the pain has resolved and it was not deemed a cardiac problem. Pt frustrated by weight , but willing to persist in her effort to lose weight Needs f/u with endo C/o lesion on leg , regularly seen by dermatology at [facility] and is advised to have them address this also   Review of Systems    See HPI Denies recent fever or chills. Denies sinus pressure, nasal congestion, ear pain or sore throat. Denies chest congestion, productive cough or wheezing. Denies chest pains, palpitations and leg swelling Denies abdominal pain, nausea, vomiting,diarrhea or constipation.   Denies dysuria, frequency, hesitancy or incontinence. Denies joint pain, swelling and limitation in mobility. Denies headaches, seizures, numbness, or tingling. Denies depression, anxiety or insomnia.    Objective:   Physical Exam  Patient alert and oriented and in no cardiopulmonary distress.  HEENT: No facial asymmetry, EOMI, no sinus tenderness,  oropharynx pink and moist.  Neck supple no adenopathy.  Chest: Clear to auscultation bilaterally.  CVS: S1, S2 no murmurs, no S3.  ABD: Soft non tender. Bowel sounds normal.  Ext: No edema  MS: Adequate ROM spine, shoulders, hips and knees.  Skin: Intact, rash on leg,  Psych: Good eye contact, normal affect. Memory intact not anxious or depressed appearing.  CNS: CN 2-12 intact, power, tone and sensation normal throughout.`       Assessment & Plan:

## 2013-03-15 NOTE — Assessment & Plan Note (Signed)
Controlled, no change in medication Patient advised to reduce carb and sweets, commit to regular physical activity, take meds as prescribed, test blood as directed, and attempt to lose weight, to improve blood sugar control.  

## 2013-03-15 NOTE — Assessment & Plan Note (Signed)
Deteriorated. Patient re-educated about  the importance of commitment to a  minimum of 150 minutes of exercise per week. The importance of healthy food choices with portion control discussed. Encouraged to start a food diary, count calories and to consider  joining a support group. Sample diet sheets offered. Goals set by the patient for the next several months.    

## 2013-03-22 ENCOUNTER — Encounter (HOSPITAL_COMMUNITY): Payer: Self-pay | Admitting: Emergency Medicine

## 2013-03-22 ENCOUNTER — Emergency Department (HOSPITAL_COMMUNITY)
Admission: EM | Admit: 2013-03-22 | Discharge: 2013-03-22 | Disposition: A | Discharge status: Home / Self Care | Payer: BC Managed Care – PPO | Attending: Emergency Medicine | Admitting: Emergency Medicine

## 2013-03-22 ENCOUNTER — Emergency Department (HOSPITAL_COMMUNITY): Payer: BC Managed Care – PPO

## 2013-03-22 DIAGNOSIS — Z8709 Personal history of other diseases of the respiratory system: Secondary | ICD-10-CM | POA: Insufficient documentation

## 2013-03-22 DIAGNOSIS — Z91040 Latex allergy status: Secondary | ICD-10-CM | POA: Insufficient documentation

## 2013-03-22 DIAGNOSIS — Z862 Personal history of diseases of the blood and blood-forming organs and certain disorders involving the immune mechanism: Secondary | ICD-10-CM | POA: Insufficient documentation

## 2013-03-22 DIAGNOSIS — W19XXXA Unspecified fall, initial encounter: Secondary | ICD-10-CM

## 2013-03-22 DIAGNOSIS — I10 Essential (primary) hypertension: Secondary | ICD-10-CM | POA: Insufficient documentation

## 2013-03-22 DIAGNOSIS — Z8742 Personal history of other diseases of the female genital tract: Secondary | ICD-10-CM | POA: Insufficient documentation

## 2013-03-22 DIAGNOSIS — E669 Obesity, unspecified: Secondary | ICD-10-CM | POA: Insufficient documentation

## 2013-03-22 DIAGNOSIS — M129 Arthropathy, unspecified: Secondary | ICD-10-CM | POA: Insufficient documentation

## 2013-03-22 DIAGNOSIS — Y9289 Other specified places as the place of occurrence of the external cause: Secondary | ICD-10-CM | POA: Insufficient documentation

## 2013-03-22 DIAGNOSIS — Z8639 Personal history of other endocrine, nutritional and metabolic disease: Secondary | ICD-10-CM | POA: Insufficient documentation

## 2013-03-22 DIAGNOSIS — Z79899 Other long term (current) drug therapy: Secondary | ICD-10-CM | POA: Insufficient documentation

## 2013-03-22 DIAGNOSIS — E119 Type 2 diabetes mellitus without complications: Secondary | ICD-10-CM | POA: Insufficient documentation

## 2013-03-22 DIAGNOSIS — Z8542 Personal history of malignant neoplasm of other parts of uterus: Secondary | ICD-10-CM | POA: Insufficient documentation

## 2013-03-22 DIAGNOSIS — S8992XA Unspecified injury of left lower leg, initial encounter: Secondary | ICD-10-CM

## 2013-03-22 DIAGNOSIS — W1809XA Striking against other object with subsequent fall, initial encounter: Secondary | ICD-10-CM | POA: Insufficient documentation

## 2013-03-22 DIAGNOSIS — E0590 Thyrotoxicosis, unspecified without thyrotoxic crisis or storm: Secondary | ICD-10-CM | POA: Insufficient documentation

## 2013-03-22 DIAGNOSIS — Z8669 Personal history of other diseases of the nervous system and sense organs: Secondary | ICD-10-CM | POA: Insufficient documentation

## 2013-03-22 DIAGNOSIS — S8990XA Unspecified injury of unspecified lower leg, initial encounter: Secondary | ICD-10-CM | POA: Insufficient documentation

## 2013-03-22 DIAGNOSIS — Y9301 Activity, walking, marching and hiking: Secondary | ICD-10-CM | POA: Insufficient documentation

## 2013-03-22 MED ORDER — PROMETHAZINE HCL 25 MG RE SUPP: 25.0000 mg | Freq: Four times a day (QID) | RECTAL | Status: DC | PRN

## 2013-03-22 MED ORDER — HYDROCODONE-ACETAMINOPHEN 5-325 MG PO TABS: 1.0000 | ORAL_TABLET | ORAL | Status: DC | PRN

## 2013-03-22 NOTE — ED Notes (Signed)
States that she fell and injured her left leg, states the leg is most painful around her knee.

## 2013-03-22 NOTE — ED Provider Notes (Signed)
CSN: [id number]     Arrival date & time 03/22/13  1131 History     First MD Initiated Contact with Patient 03/22/13 1300     Chief Complaint  Patient presents with  . Leg Injury   (Consider location/radiation/quality/duration/timing/severity/associated sxs/prior Treatment) Patient is a 52 y.o. female presenting with knee pain. The history is provided by the patient.  Knee Pain Location:  Knee Knee location:  L knee Pain details:    Quality:  Throbbing   Severity:  Moderate   Onset quality:  Sudden   Duration:  2 hours   Timing:  Constant   Progression:  Worsening Chronicity:  New Dislocation: no   Foreign body present:  No foreign bodies Prior injury to area:  Yes Relieved by:  Nothing Worsened by:  Activity, bearing weight and flexion Ineffective treatments:  None tried Associated symptoms: decreased ROM and swelling   Associated symptoms: no fever    Sabrina [name] is a 52 y.o. female who presents to the ED with left knee pain after a fall. She was walking in a parking lot and it was wet. She slipped and fell on her left leg. The injury occurred approximately 10:30 this morning.   Past Medical History  Diagnosis Date  . Thyroid disease   . Hypertension   . Allergy   . Diabetes mellitus   . Vitamin D deficiency   . Obesity   . Secondary hyperthyroidism   . Hypersomnia with sleep apnea   . Arthritis   . Chronic bronchitis   . Fibroid uterus   . Menorrhagia   . Hx of iron deficiency anemia   . Endometriosis    Past Surgical History  Procedure Laterality Date  . Abdominal hysterectomy  2004    supracervical hysterectomy  . Breast surgery  2005    reduction  . Dilation and curettage of uterus      age 16   Family History  Problem Relation Age of Onset  . Hypertension Mother   . Diabetes Mother   . Diabetes Father   . Stroke Father   . Hypertension Sister     x 2  . Stroke Maternal Aunt    History  Substance Use Topics  . Smoking status: Never  Smoker   . Smokeless tobacco: Never Used  . Alcohol Use: No   OB History   Grav Para Term Preterm Abortions TAB SAB Ect Mult Living   0 0 0 0 0 0 0 0 0 0      Review of Systems  Constitutional: Negative for fever.  Eyes: Negative for visual disturbance.  Gastrointestinal: Negative for nausea and vomiting.  Genitourinary: Negative for dysuria and frequency.  Musculoskeletal:       Left knee pain  Skin: Negative for wound.  Neurological: Negative for syncope and headaches.  Psychiatric/Behavioral: The patient is not nervous/anxious.     Allergies  Codeine; Peanut-containing drug products; and Latex  Home Medications   Current Outpatient Rx  Name  Route  Sig  Dispense  Refill  . amLODipine (NORVASC) 5 MG tablet   Oral   Take 5 mg by mouth daily.         [name] ibuprofen (ADVIL,MOTRIN) 200 MG tablet   Oral   Take 200 mg by mouth every 6 (six) hours as needed for pain.         [name] levothyroxine (SYNTHROID, LEVOTHROID) 50 MCG tablet   Oral   Take 50 mcg by mouth daily.         [name]  metFORMIN (GLUCOPHAGE) 1000 MG tablet   Oral   Take 1 tablet (1,000 mg total) by mouth daily with breakfast.   30 tablet   11   . potassium chloride SA (K-DUR,KLOR-CON) 20 MEQ tablet   Oral   Take 1 tablet (20 mEq total) by mouth 2 (two) times daily.   60 tablet   4    BP 151/75  Pulse 99  Temp(Src) 98.2 F (36.8 C) (Oral)  Resp 18  Ht 5' (1.524 m)  Wt 252 lb (114.306 kg)  BMI 49.22 kg/m2  SpO2 99%  LMP 10/31/2002 Physical Exam  Nursing note and vitals reviewed. Constitutional: She is oriented to person, place, and time. She appears well-developed and well-nourished. No distress.  HENT:  Head: Normocephalic and atraumatic.  Right Ear: Tympanic membrane normal.  Left Ear: Tympanic membrane normal.  Nose: Nose normal.  Mouth/Throat: Uvula is midline, oropharynx is clear and moist and mucous membranes are normal.  Eyes: Conjunctivae and EOM are normal. Pupils are equal, round, and  reactive to light.  Neck: Neck supple.  Cardiovascular: Normal rate and regular rhythm.   Pulmonary/Chest: Effort normal and breath sounds normal.  Abdominal: Soft. There is no tenderness.  Musculoskeletal:       Left knee: She exhibits decreased range of motion and swelling. She exhibits no deformity, no erythema, normal alignment, no LCL laxity and normal patellar mobility. Tenderness found. MCL tenderness noted.       Legs: Pedal pulses equal bilateral, adequate circulation, good touch sensation.   Neurological: She is alert and oriented to person, place, and time. She has normal strength and normal reflexes. No cranial nerve deficit or sensory deficit. Abnormal gait: due to pain.  Pedal pulses strong and equal.  Skin: Skin is warm and dry.  Psychiatric: She has a normal mood and affect. Her behavior is normal.    ED Course   Procedures  Labs Reviewed - No data to display Dg Knee Complete 4 Views Left  03/22/2013   *RADIOLOGY REPORT*  Clinical Data: Pain post fall.  LEFT KNEE - COMPLETE 4+ VIEW  Comparison: None.  Findings: Small marginal spurs about all three compartments of the knee.  There is mild narrowing of the cartilage in the medial compartment.  Negative for fracture, dislocation, or effusion. Normal mineralization and alignment.  IMPRESSION: 1.  Negative for fracture or other acute abnormality. 2.  Early tricompartmental degenerative changes as above.   Original Report Authenticated By: D. Andria Rhein, MD    MDM  52 y.o. female with left knee injury s/p fall. Will treat pain, place patient in knee immobilizer and give her crutches and ice pack. She is to follow up with ortho if pain persist.  I have reviewed this patient's vital signs, nurses notes, appropriate and imaging. I have discussed findings and plan of care with the patient and she voices understanding.    Medication List    TAKE these medications       HYDROcodone-acetaminophen 5-325 MG per tablet  Commonly  known as:  NORCO/VICODIN  Take 1 tablet by mouth every 4 (four) hours as needed.     promethazine 25 MG suppository  Commonly known as:  PHENERGAN  Place 1 suppository (25 mg total) rectally every 6 (six) hours as needed for nausea.      ASK your doctor about these medications       amLODipine 5 MG tablet  Commonly known as:  NORVASC  Take 5 mg by mouth daily.  ibuprofen 200 MG tablet  Commonly known as:  ADVIL,MOTRIN  Take 200 mg by mouth every 6 (six) hours as needed for pain.     levothyroxine 50 MCG tablet  Commonly known as:  SYNTHROID, LEVOTHROID  Take 50 mcg by mouth daily.     metFORMIN 1000 MG tablet  Commonly known as:  GLUCOPHAGE  Take 1 tablet (1,000 mg total) by mouth daily with breakfast.     potassium chloride SA 20 MEQ tablet  Commonly known as:  K-DUR,KLOR-CON  Take 1 tablet (20 mEq total) by mouth 2 (two) times daily.           [address] [place], [place] 03/23/13 [phone]

## 2013-03-25 NOTE — ED Provider Notes (Signed)
Medical screening examination/treatment/procedure(s) were performed by non-physician practitioner and as supervising physician I was immediately available for consultation/collaboration. Devoria Albe, MD, [facility]   Ward Givens, MD 03/25/13 [phone]

## 2013-04-01 ENCOUNTER — Other Ambulatory Visit: Payer: Self-pay | Admitting: Family Medicine

## 2013-04-01 ENCOUNTER — Telehealth: Payer: Self-pay | Admitting: Family Medicine

## 2013-04-01 MED ORDER — TRIAMTERENE-HCTZ 37.5-25 MG PO TABS: ORAL_TABLET | ORAL | Status: DC

## 2013-04-01 NOTE — Telephone Encounter (Signed)
Amlodipine is causing her to have a rash and wants it changed. Have been breaking out for over a year and her dermatologist thought it was metformin at first but it wasn't. Now are fairly confident its the amlodipine. Wants to change to something else. Please advise

## 2013-04-01 NOTE — Telephone Encounter (Signed)
Changed to maxzide half daily, pls fax, could not be e scribed  I left a msg to call me back so she would know what was happening

## 2013-04-02 NOTE — Telephone Encounter (Signed)
Patient aware.

## 2013-04-02 NOTE — Telephone Encounter (Signed)
Called patient and left message for them to return call at the office   

## 2013-04-14 ENCOUNTER — Other Ambulatory Visit (INDEPENDENT_AMBULATORY_CARE_PROVIDER_SITE_OTHER): Payer: Self-pay | Admitting: Otolaryngology

## 2013-04-14 DIAGNOSIS — E041 Nontoxic single thyroid nodule: Secondary | ICD-10-CM

## 2013-04-21 ENCOUNTER — Ambulatory Visit (HOSPITAL_COMMUNITY): Payer: BC Managed Care – PPO

## 2013-05-01 ENCOUNTER — Ambulatory Visit (HOSPITAL_COMMUNITY)
Admission: RE | Admit: 2013-05-01 | Discharge: 2013-05-01 | Disposition: A | Discharge status: Home / Self Care | Payer: BC Managed Care – PPO | Source: Ambulatory Visit | Attending: Otolaryngology | Admitting: Otolaryngology

## 2013-05-01 DIAGNOSIS — E041 Nontoxic single thyroid nodule: Secondary | ICD-10-CM

## 2013-05-06 ENCOUNTER — Ambulatory Visit (HOSPITAL_COMMUNITY): Payer: BC Managed Care – PPO

## 2013-06-18 ENCOUNTER — Other Ambulatory Visit: Payer: Self-pay

## 2013-06-25 ENCOUNTER — Other Ambulatory Visit: Payer: Self-pay | Admitting: Family Medicine

## 2013-06-25 LAB — LIPID PANEL
LDL Cholesterol: 88 mg/dL (ref 0–99)
Total CHOL/HDL Ratio: 4.4 Ratio
VLDL: 21 mg/dL (ref 0–40)

## 2013-06-25 LAB — HEMOGLOBIN A1C
Hgb A1c MFr Bld: 6.3 % — ABNORMAL HIGH (ref <5.7)
Mean Plasma Glucose: 134 mg/dL — ABNORMAL HIGH (ref <117)

## 2013-06-25 LAB — COMPLETE METABOLIC PANEL WITH GFR
ALT: 8 U/L (ref 0–35)
Alkaline Phosphatase: 46 U/L (ref 39–117)
Creat: 1.07 mg/dL (ref 0.50–1.10)
Sodium: 140 mEq/L (ref 135–145)
Total Bilirubin: 0.3 mg/dL (ref 0.3–1.2)
Total Protein: 7.2 g/dL (ref 6.0–8.3)

## 2013-06-26 ENCOUNTER — Encounter: Payer: Self-pay | Admitting: Family Medicine

## 2013-06-26 ENCOUNTER — Ambulatory Visit (INDEPENDENT_AMBULATORY_CARE_PROVIDER_SITE_OTHER): Payer: BC Managed Care – PPO | Admitting: Family Medicine

## 2013-06-26 DIAGNOSIS — E559 Vitamin D deficiency, unspecified: Secondary | ICD-10-CM

## 2013-06-26 DIAGNOSIS — Z6841 Body Mass Index (BMI) 40.0 and over, adult: Secondary | ICD-10-CM

## 2013-06-26 DIAGNOSIS — E119 Type 2 diabetes mellitus without complications: Secondary | ICD-10-CM

## 2013-06-26 DIAGNOSIS — I10 Essential (primary) hypertension: Secondary | ICD-10-CM

## 2013-06-26 DIAGNOSIS — E039 Hypothyroidism, unspecified: Secondary | ICD-10-CM

## 2013-06-26 DIAGNOSIS — R809 Proteinuria, unspecified: Secondary | ICD-10-CM

## 2013-06-26 DIAGNOSIS — R5382 Chronic fatigue, unspecified: Secondary | ICD-10-CM

## 2013-06-26 DIAGNOSIS — Z1211 Encounter for screening for malignant neoplasm of colon: Secondary | ICD-10-CM

## 2013-06-26 DIAGNOSIS — Z23 Encounter for immunization: Secondary | ICD-10-CM

## 2013-06-26 DIAGNOSIS — R5381 Other malaise: Secondary | ICD-10-CM

## 2013-06-26 MED ORDER — PRAVASTATIN SODIUM 10 MG PO TABS: 10.0000 mg | ORAL_TABLET | Freq: Every day | ORAL | Status: DC

## 2013-06-26 MED ORDER — TRIAMTERENE-HCTZ 37.5-25 MG PO TABS: 1.0000 | ORAL_TABLET | Freq: Every day | ORAL | Status: DC

## 2013-06-26 NOTE — Patient Instructions (Addendum)
F/u in 4 month, call if you need me before  Flu vaccine and pneumonia vaccine today  Microalb from office today   Continue triamterene ONE daily, blood pressure is excellent  You will be referred for a colonoscopy, hopefully before the end of the year  Pls ask your opthalmologist to send eye exam report  Call if you decide on therapy  New medication pravacol one at night to reduce risk of heart disease, as we discussed  It is important that you exercise regularly at least 30 minutes 5 times a week. If you develop chest pain, have severe difficulty breathing, or feel very tired, stop exercising immediately and seek medical attention    A healthy diet is rich in fruit, vegetables and whole grains. Poultry fish, nuts and beans are a healthy choice for protein rather then red meat. A low sodium diet and drinking 64 ounces of water daily is generally recommended. Oils and sweet should be limited. Carbohydrates especially for those who are diabetic or overweight, should be limited to 60-45 gram per meal. It is important to eat on a regular schedule, at least 3 times daily. Snacks should be primarily fruits, vegetables or nuts.  HBA1C, cmp and EGFR and CBc non fasting in 4 month.  Vit D result will be sent to you, you may need to resume once weekly medication, I will let you know

## 2013-06-26 NOTE — Progress Notes (Signed)
  Subjective:    Patient ID: Sabrina [name], female    DOB: 05/12/1961, 52 y.o.   MRN: [id number]  HPI The PT is here for follow up and re-evaluation of chronic medical conditions, medication management and review of any available recent lab and radiology data.  Preventive health is updated, specifically  Cancer screening and Immunization.  Needs eye exam Continues to be followed at [facility] dermatology for hyperpigmented skin lesions of unclear etiology. Underlying immunologic disorder is being considered, and she is now on methotrexate. Has appt with rheumatologist in the near future. Lupus, has been entertained per her account, review of recent record mentions scleroderma. She is overwhelmed and worried , but trying to be strong. Willing to consider psychotherapy Espescialy concerned about lesions on shins which have not been biopsied The PT denies any adverse reactions to current medications since the last visit.  Still c/o fatigue, but sleep habits are poor, no regula exercise, actually injured her left knee in a fall during August, and no significant weight loss as of yet Denies polyuria, polydipsia or blurred vision    Review of Systems See HPI Denies recent fever or chills. Denies sinus pressure, nasal congestion, ear pain or sore throat. Denies chest congestion, productive cough or wheezing. Denies chest pains, palpitations and leg swelling Denies abdominal pain, nausea, vomiting,diarrhea or constipation.   Denies dysuria, frequency, hesitancy or incontinence.  Denies headaches, seizures, numbness, or tingling. Denies uncontrolled  depression, anxiety or insomnia.       Objective:   Physical Exam  Patient alert and oriented and in no cardiopulmonary distress.  HEENT: No facial asymmetry, EOMI, no sinus tenderness,  oropharynx pink and moist.  Neck supple no adenopathy.  Chest: Clear to auscultation bilaterally.  CVS: S1, S2 no murmurs, no S3.  ABD: Soft  non tender. Bowel sounds normal.  Ext: No edema  MS: Adequate ROM spine, shoulders, hips and knees.  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 Memory intact not anxious, mildly  depressed appearing.  CNS: CN 2-12 intact, power, tone and sensation normal throughout.       Assessment & Plan:

## 2013-06-27 ENCOUNTER — Encounter: Payer: Self-pay | Admitting: Family Medicine

## 2013-06-27 LAB — MICROALBUMIN / CREATININE URINE RATIO: Microalb Creat Ratio: 38.7 mg/g — ABNORMAL HIGH (ref 0.0–30.0)

## 2013-06-27 MED ORDER — ERGOCALCIFEROL 1.25 MG (50000 UT) PO CAPS: 50000.0000 [IU] | ORAL_CAPSULE | ORAL | Status: DC

## 2013-06-27 NOTE — Assessment & Plan Note (Signed)
Still c/o fatigue , but goes to bed after 1 am sometimes, watching TV, and is not committed to regular exercise Needs to work on these.  Will f/u sleep study report which pt states was nmormal

## 2013-06-27 NOTE — Assessment & Plan Note (Signed)
Improved since last year. Better control of diabetes and hTN will further help

## 2013-06-27 NOTE — Assessment & Plan Note (Signed)
Controlled, no change in medication DASH diet and commitment to daily physical activity for a minimum of 30 minutes discussed and encouraged, as a part of hypertension management. The importance of attaining a healthy weight is also discussed.  

## 2013-06-27 NOTE — Assessment & Plan Note (Signed)
Deteriorated , with increase in HBa1C Patient advised to reduce carb and sweets, commit to regular physical activity, take meds as prescribed, test blood as directed, and attempt to lose weight, to improve blood sugar control.

## 2013-06-27 NOTE — Assessment & Plan Note (Signed)
Controlled and also followed by endocrine

## 2013-06-27 NOTE — Assessment & Plan Note (Signed)
Weekly supplements for 6 month needed

## 2013-06-27 NOTE — Assessment & Plan Note (Signed)
Unchanged. Patient re-educated about  the importance of commitment to a  minimum of 150 minutes of exercise per week. The importance of healthy food choices with portion control discussed. Encouraged to start a food diary, count calories and to consider  joining a support group. Sample diet sheets offered. Goals set by the patient for the next several months.    

## 2013-07-02 ENCOUNTER — Encounter: Payer: Self-pay | Admitting: Internal Medicine

## 2013-07-05 ENCOUNTER — Telehealth: Payer: Self-pay | Admitting: Family Medicine

## 2013-07-05 NOTE — Telephone Encounter (Signed)
Pls contact pt, oK to call her at work #, explain when we followed up, no record of her actually getting sleep study done, Per Dr Ronal Fear office she cancelled and never re scheduled I do recommend evaluation for sleep apnea, if she agrees and wants to be referred let me know so I can enter referral

## 2013-07-06 NOTE — Telephone Encounter (Signed)
She does NOT want to go back to [facility] office but she is so busy with Drs right now she just wants to hold off on a referral somewhere else. She will let us know when she is ready

## 2013-07-07 NOTE — Telephone Encounter (Signed)
noted 

## 2013-07-23 ENCOUNTER — Telehealth: Payer: Self-pay

## 2013-07-24 MED ORDER — AZITHROMYCIN 250 MG PO TABS: ORAL_TABLET | ORAL | Status: AC

## 2013-07-24 NOTE — Telephone Encounter (Signed)
First heard of msg this morning, never got to my inbox, advised that ticket be sent on this and gave verbal order as stated

## 2013-07-24 NOTE — Telephone Encounter (Signed)
Received verbal order for z pak.  Rx sent to requested pharmacy.   Patient complaining of congestion with yellow colored sputum.  Patient made aware of rx.

## 2013-07-31 ENCOUNTER — Ambulatory Visit (AMBULATORY_SURGERY_CENTER): Payer: Self-pay | Admitting: *Deleted

## 2013-07-31 VITALS — Ht 60.0 in | Wt 249.6 lb

## 2013-07-31 DIAGNOSIS — Z1211 Encounter for screening for malignant neoplasm of colon: Secondary | ICD-10-CM

## 2013-07-31 MED ORDER — MOVIPREP 100 G PO SOLR: ORAL | Status: DC

## 2013-07-31 NOTE — Progress Notes (Signed)
No allergies to eggs or soy. No problems with anesthesia.  

## 2013-08-04 ENCOUNTER — Encounter: Payer: Self-pay | Admitting: Internal Medicine

## 2013-08-11 ENCOUNTER — Ambulatory Visit (AMBULATORY_SURGERY_CENTER): Payer: BC Managed Care – PPO | Admitting: Internal Medicine

## 2013-08-11 ENCOUNTER — Encounter: Payer: Self-pay | Admitting: Internal Medicine

## 2013-08-11 VITALS — BP 145/92 | HR 73 | Temp 98.4°F | Resp 27 | Ht 61.0 in | Wt 249.0 lb

## 2013-08-11 DIAGNOSIS — D126 Benign neoplasm of colon, unspecified: Secondary | ICD-10-CM

## 2013-08-11 DIAGNOSIS — Z1211 Encounter for screening for malignant neoplasm of colon: Secondary | ICD-10-CM

## 2013-08-11 LAB — GLUCOSE, CAPILLARY: Glucose-Capillary: 131 mg/dL — ABNORMAL HIGH (ref 70–99)

## 2013-08-11 MED ORDER — DEXTROSE 5 % IV SOLN: INTRAVENOUS | Status: DC

## 2013-08-11 NOTE — Progress Notes (Signed)
Called to room to assist during endoscopic procedure.  Patient ID and intended procedure confirmed with present staff. Received instructions for my participation in the procedure from the performing physician.  

## 2013-08-11 NOTE — Patient Instructions (Signed)
YOU HAD AN ENDOSCOPIC PROCEDURE TODAY AT THE [facility]: Refer to the procedure report that was given to you for any specific questions about what was found during the examination.  If the procedure report does not answer your questions, please call your gastroenterologist to clarify.  If you requested that your care partner not be given the details of your procedure findings, then the procedure report has been included in a sealed envelope for you to review at your convenience later.  YOU SHOULD EXPECT: Some feelings of bloating in the abdomen. Passage of more gas than usual.  Walking can help get rid of the air that was put into your GI tract during the procedure and reduce the bloating. If you had a lower endoscopy (such as a colonoscopy or flexible sigmoidoscopy) you may notice spotting of blood in your stool or on the toilet paper. If you underwent a bowel prep for your procedure, then you may not have a normal bowel movement for a few days.  DIET: Your first meal following the procedure should be a light meal and then it is ok to progress to your normal diet.  A half-sandwich or bowl of soup is an example of a good first meal.  Heavy or fried foods are harder to digest and may make you feel nauseous or bloated.  Likewise meals heavy in dairy and vegetables can cause extra gas to form and this can also increase the bloating.  Drink plenty of fluids but you should avoid alcoholic beverages for 24 hours.  ACTIVITY: Your care partner should take you home directly after the procedure.  You should plan to take it easy, moving slowly for the rest of the day.  You can resume normal activity the day after the procedure however you should NOT DRIVE or use heavy machinery for 24 hours (because of the sedation medicines used during the test).    SYMPTOMS TO REPORT IMMEDIATELY: A gastroenterologist can be reached at any hour.  During normal business hours, 8:30 AM to 5:00 PM Monday through Friday,  call [phone].  After hours and on weekends, please call the GI answering service at [phone] who will take a message and have the physician on call contact you.   Following lower endoscopy (colonoscopy or flexible sigmoidoscopy):  Excessive amounts of blood in the stool  Significant tenderness or worsening of abdominal pains  Swelling of the abdomen that is new, acute  Fever of 100F or higher  FOLLOW UP: If any biopsies were taken you will be contacted by phone or by letter within the next 1-3 weeks.  Call your gastroenterologist if you have not heard about the biopsies in 3 weeks.  Our staff will call the home number listed on your records the next business day following your procedure to check on you and address any questions or concerns that you may have at that time regarding the information given to you following your procedure. This is a courtesy call and so if there is no answer at the home number and we have not heard from you through the emergency physician on call, we will assume that you have returned to your regular daily activities without incident.  SIGNATURES/CONFIDENTIALITY: You and/or your care partner have signed paperwork which will be entered into your electronic medical record.  These signatures attest to the fact that that the information above on your After Visit Summary has been reviewed and is understood.  Full responsibility of the confidentiality of this   discharge information lies with you and/or your care-partner.  Recommendations See procedure report  

## 2013-08-11 NOTE — Op Note (Signed)
[facility] [address] [place], [address]   COLONOSCOPY PROCEDURE REPORT  PATIENT: Sabrina, [name]  MR#: [id number] BIRTHDATE: [birth year] , 52  yrs. old GENDER: Female ENDOSCOPIST: Beverley Fiedler, MD REFERRED NG:[id number], Margaret PROCEDURE DATE:  08/11/2013 PROCEDURE:   Colonoscopy with snare polypectomy First Screening Colonoscopy - Avg.  risk and is 50 yrs.  old or older Yes.  Prior Negative Screening - Now for repeat screening. [name]  History of Adenoma - Now for follow-up colonoscopy & has been > or = to 3 yrs.  [name]  Polyps Removed Today? Yes. ASA CLASS:   Class III INDICATIONS:average risk screening and first colonoscopy. MEDICATIONS: MAC sedation, administered by CRNA and Propofol (Diprivan) 360 mg IV  DESCRIPTION OF PROCEDURE:   After the risks benefits and alternatives of the procedure were thoroughly explained, informed consent was obtained.  A digital rectal exam revealed no rectal mass.   The Pentax Ultra Slim [id number]  endoscope was introduced through the anus and advanced to the cecum, which was identified by both the appendix and ileocecal valve. No adverse events experienced.   The quality of the prep was good, using MoviPrep The instrument was then slowly withdrawn as the colon was fully examined.   COLON FINDINGS: A sessile polyp measuring 6 mm in size was found in the ascending colon.  A polypectomy was performed using snare cautery.  The resection was complete and the polyp tissue was completely retrieved.   A pedunculated polyp measuring 10 mm in size was found at the hepatic flexure.  A polypectomy was performed using snare cautery.  The resection was complete and the polyp tissue was completely retrieved.   Three diminutive sessile polyps, measuring 2-4 mm in size, were found in the distal sigmoid colon. Polypectomy was performed using cold snare.  All resections were complete and all polyp tissue was completely  retrieved. Retroflexed views revealed no abnormalities. The time to cecum=5 minutes 03 seconds.  Withdrawal time=18 minutes 30 seconds.  The scope was withdrawn and the procedure completed.  COMPLICATIONS: There were no complications.  ENDOSCOPIC IMPRESSION: 1.   Sessile polyp measuring 6 mm in size was found in the ascending colon; polypectomy was performed using snare cautery 2.   Pedunculated polyp measuring 10 mm in size was found at the hepatic flexure; polypectomy was performed using snare cautery 3.   Three diminutive sessile polyps, measuring 2-4 mm in size, were found in the distal sigmoid colon; Polypectomy was performed using cold snare      RECOMMENDATIONS: 1.  Hold aspirin, aspirin products, and anti-inflammatory medication for 2 weeks. 2.  Await pathology results 3.  If the polyps removed today are proven to be adenomatous (pre-cancerous) polyps, you will need a colonoscopy in 3 years. Otherwise you should continue to follow colorectal cancer screening guidelines for "routine risk" patients with a colonoscopy in 10 years.  You will receive a letter within 1-2 weeks with the results of your biopsy as well as final recommendations.  Please call my office if you have not received a letter after 3 weeks.   eSigned:  Beverley Fiedler, MD 08/11/2013 4:15 PM   cc: The Patient and Syliva Overman, MD   PATIENT NAME:  Sabrina, [name] MR#: [id number]

## 2013-08-11 NOTE — Progress Notes (Signed)
Report to pacu rn, vss, bbs=clear 

## 2013-08-12 ENCOUNTER — Telehealth: Payer: Self-pay | Admitting: *Deleted

## 2013-08-12 NOTE — Telephone Encounter (Signed)
  Follow up Call-  Call back number 08/11/2013  Post procedure Call Back phone  # [phone]  Permission to leave phone message Yes     Patient questions:  Do you have a fever, pain , or abdominal swelling? no Pain Score  0 *  Have you tolerated food without any problems? yes  Have you been able to return to your normal activities? yes  Do you have any questions about your discharge instructions: Diet   no Medications  no Follow up visit  no  Do you have questions or concerns about your Care? no  Actions: * If pain score is 4 or above: No action needed, pain <4.  Information provided via spouse,pt. Still sleep,he stated that they were good.

## 2013-08-17 ENCOUNTER — Encounter: Payer: Self-pay | Admitting: Internal Medicine

## 2013-08-21 NOTE — Addendum Note (Signed)
Addended by: Lowry Ram on: 08/21/2013 01:43 PM   Modules accepted: Level of Service

## 2013-09-30 ENCOUNTER — Other Ambulatory Visit: Payer: Self-pay | Admitting: Family Medicine

## 2013-09-30 ENCOUNTER — Telehealth: Payer: Self-pay

## 2013-09-30 DIAGNOSIS — Z6841 Body Mass Index (BMI) 40.0 and over, adult: Secondary | ICD-10-CM

## 2013-09-30 DIAGNOSIS — I10 Essential (primary) hypertension: Secondary | ICD-10-CM

## 2013-09-30 MED ORDER — PREDNISONE 5 MG PO TABS: 5.0000 mg | ORAL_TABLET | Freq: Two times a day (BID) | ORAL | Status: DC

## 2013-09-30 NOTE — Telephone Encounter (Signed)
pls send in pred 5mg  one twice daily for 5 days and let her know

## 2013-09-30 NOTE — Telephone Encounter (Signed)
Prednisone sent to requested pharmacy and patient aware.

## 2013-09-30 NOTE — Telephone Encounter (Signed)
Labs ordered per request from Dr. Leonie Green fax [phone]

## 2013-10-02 LAB — CBC WITH DIFFERENTIAL/PLATELET
BASOS PCT: 0 % (ref 0–1)
Basophils Absolute: 0 10*3/uL (ref 0.0–0.1)
EOS ABS: 0.5 10*3/uL (ref 0.0–0.7)
EOS PCT: 4 % (ref 0–5)
HCT: 32.6 % — ABNORMAL LOW (ref 36.0–46.0)
HEMOGLOBIN: 11.2 g/dL — AB (ref 12.0–15.0)
Lymphocytes Relative: 29 % (ref 12–46)
Lymphs Abs: 3.8 10*3/uL (ref 0.7–4.0)
MCH: 27.9 pg (ref 26.0–34.0)
MCHC: 34.4 g/dL (ref 30.0–36.0)
MCV: 81.3 fL (ref 78.0–100.0)
MONOS PCT: 7 % (ref 3–12)
Monocytes Absolute: 0.9 10*3/uL (ref 0.1–1.0)
NEUTROS PCT: 60 % (ref 43–77)
Neutro Abs: 7.9 10*3/uL — ABNORMAL HIGH (ref 1.7–7.7)
Platelets: 410 10*3/uL — ABNORMAL HIGH (ref 150–400)
RBC: 4.01 MIL/uL (ref 3.87–5.11)
RDW: 14.7 % (ref 11.5–15.5)
WBC: 13.1 10*3/uL — ABNORMAL HIGH (ref 4.0–10.5)

## 2013-10-02 LAB — COMPREHENSIVE METABOLIC PANEL
ALK PHOS: 49 U/L (ref 39–117)
ALT: 12 U/L (ref 0–35)
AST: 15 U/L (ref 0–37)
Albumin: 3.9 g/dL (ref 3.5–5.2)
BILIRUBIN TOTAL: 0.3 mg/dL (ref 0.2–1.2)
BUN: 18 mg/dL (ref 6–23)
CO2: 31 mEq/L (ref 19–32)
CREATININE: 0.88 mg/dL (ref 0.50–1.10)
Calcium: 8.1 mg/dL — ABNORMAL LOW (ref 8.4–10.5)
Chloride: 99 mEq/L (ref 96–112)
GLUCOSE: 87 mg/dL (ref 70–99)
Potassium: 3.9 mEq/L (ref 3.5–5.3)
SODIUM: 139 meq/L (ref 135–145)
Total Protein: 7.4 g/dL (ref 6.0–8.3)

## 2013-10-05 LAB — HEMOGLOBIN A1C
Hgb A1c MFr Bld: 6.3 % — ABNORMAL HIGH (ref <5.7)
MEAN PLASMA GLUCOSE: 134 mg/dL — AB (ref <117)

## 2013-10-06 ENCOUNTER — Encounter: Payer: Self-pay | Admitting: Family Medicine

## 2013-10-23 ENCOUNTER — Encounter: Payer: Self-pay | Admitting: Family Medicine

## 2013-10-23 ENCOUNTER — Ambulatory Visit (INDEPENDENT_AMBULATORY_CARE_PROVIDER_SITE_OTHER): Payer: BC Managed Care – PPO | Admitting: Family Medicine

## 2013-10-23 ENCOUNTER — Telehealth (HOSPITAL_COMMUNITY): Payer: Self-pay | Admitting: *Deleted

## 2013-10-23 VITALS — BP 118/76 | HR 100 | Resp 18 | Ht 61.0 in | Wt 249.1 lb

## 2013-10-23 DIAGNOSIS — Z6841 Body Mass Index (BMI) 40.0 and over, adult: Secondary | ICD-10-CM

## 2013-10-23 DIAGNOSIS — E039 Hypothyroidism, unspecified: Secondary | ICD-10-CM

## 2013-10-23 DIAGNOSIS — Z79899 Other long term (current) drug therapy: Secondary | ICD-10-CM

## 2013-10-23 DIAGNOSIS — L989 Disorder of the skin and subcutaneous tissue, unspecified: Secondary | ICD-10-CM

## 2013-10-23 DIAGNOSIS — F3289 Other specified depressive episodes: Secondary | ICD-10-CM

## 2013-10-23 DIAGNOSIS — F329 Major depressive disorder, single episode, unspecified: Secondary | ICD-10-CM

## 2013-10-23 DIAGNOSIS — N2581 Secondary hyperparathyroidism of renal origin: Secondary | ICD-10-CM

## 2013-10-23 DIAGNOSIS — F32A Depression, unspecified: Secondary | ICD-10-CM | POA: Insufficient documentation

## 2013-10-23 DIAGNOSIS — E1129 Type 2 diabetes mellitus with other diabetic kidney complication: Secondary | ICD-10-CM

## 2013-10-23 DIAGNOSIS — I10 Essential (primary) hypertension: Secondary | ICD-10-CM

## 2013-10-23 MED ORDER — ERGOCALCIFEROL 1.25 MG (50000 UT) PO CAPS: 50000.0000 [IU] | ORAL_CAPSULE | ORAL | Status: DC

## 2013-10-23 MED ORDER — METFORMIN HCL 1000 MG PO TABS: 1000.0000 mg | ORAL_TABLET | Freq: Two times a day (BID) | ORAL | Status: DC

## 2013-10-23 MED ORDER — PHENTERMINE HCL 37.5 MG PO TABS: 37.5000 mg | ORAL_TABLET | Freq: Every day | ORAL | Status: DC

## 2013-10-23 NOTE — Patient Instructions (Addendum)
F/U in 3.5 months, call if you need me before  New is phentermine one  Daily  Weight loss foal is 2.5 pounds per month  It is important that you exercise regularly at least 30 minutes 5 times a week. If you develop chest pain, have severe difficulty breathing, or feel very tired, stop exercising immediately and seek medical attention    A healthy diet is rich in fruit, vegetables and whole grains. Poultry fish, nuts and beans are a healthy choice for protein rather then red meat. A low sodium diet and drinking 64 ounces of water daily is generally recommended. Oils and sweet should be limited. Carbohydrates especially for those who are diabetic or overweight, should be limited to 45 to 60 gram per meal. It is important to eat on a regular schedule, at least 3 times daily. Snacks should be primarily fruits, vegetables or nuts.  Adequate rest, generally 6 to 8 hours per night is important for good health.Good sleep hygiene involves setting a regular bedtime, and turning off all sound and light in your sleep environment.Limiting caffeine intake will also help with the ability to rest well  Lab work needs to be done 3 to 5 days before your follow up visit please.Fasting lipid, cmp and EGFR, hBA1C, TSH  You are referred to therapist  Call your dermatologist  All medications need to be brought to every visit

## 2013-10-24 DIAGNOSIS — L930 Discoid lupus erythematosus: Secondary | ICD-10-CM | POA: Insufficient documentation

## 2013-10-24 NOTE — Assessment & Plan Note (Signed)
Updated lab needed at/ before next visit.   

## 2013-10-24 NOTE — Assessment & Plan Note (Signed)
Controlled, no change in medication DASH diet and commitment to daily physical activity for a minimum of 30 minutes discussed and encouraged, as a part of hypertension management. The importance of attaining a healthy weight is also discussed.  

## 2013-10-24 NOTE — Assessment & Plan Note (Signed)
Controlled, no change in medication Patient advised to reduce carb and sweets, commit to regular physical activity, take meds as prescribed, test blood as directed, and attempt to lose weight, to improve blood sugar control.  

## 2013-10-24 NOTE — Assessment & Plan Note (Signed)
unchanged. Patient re-educated about  the importance of commitment to a  minimum of 150 minutes of exercise per week. The importance of healthy food choices with portion control discussed.Pt to start daily phentemine Encouraged to start a food diary, count calories and to consider  joining a support group. Sample diet sheets offered. Goals set by the patient for the next several months.

## 2013-10-24 NOTE — Assessment & Plan Note (Signed)
Pt overwhelmed with health issue of skin disease, now affecting her face , and lack of success with weight loss, will refer for counseling , she wants to go at this time. She does have excellent support from spouse and family Not suicidal or homicidal. Needs help with self esteem issues

## 2013-10-24 NOTE — Assessment & Plan Note (Signed)
Needs to re establish with endo

## 2013-10-24 NOTE — Progress Notes (Signed)
Subjective:    Patient ID: Sabrina [name], female    DOB: 1961-07-07, 53 y.o.   MRN: [id number]  HPI The PT is here for follow up and re-evaluation of chronic medical conditions, medication management and review of any available recent lab and radiology data.  Preventive health is updated, specifically  Cancer screening and Immunization.   Still being followed regularly by dermatology, recent deterioration in skin with facial lesions concerns regarding consultations or procedures which the PT has had in the interim are  addressed. The PT denies any adverse reactions to current medications since the last visit.  Now willing to see therapist, self esteem issues are major No weight loss success to  date      Review of Systems See HPI Denies recent fever or chills. Denies sinus pressure, nasal congestion, ear pain or sore throat. Denies chest congestion, productive cough or wheezing. Denies chest pains, palpitations and leg swelling Denies abdominal pain, nausea, vomiting,diarrhea or constipation.   Denies dysuria, frequency, hesitancy or incontinence. Denies joint pain, swelling and limitation in mobility. Denies headaches, seizures, numbness, or tingling. Denies depression, anxiety ,insomnia.well controlled wih gabapentin for itching         Objective:   Physical Exam BP 118/76  Pulse 100  Resp 18  Ht 5\' 1"  (1.549 m)  Wt 249 lb 1.9 oz (113 kg)  BMI 47.10 kg/m2  SpO2 94%  LMP 10/31/2002 Patient alert and oriented and in no cardiopulmonary distress.  HEENT: No facial asymmetry, EOMI, no sinus tenderness,  oropharynx pink and moist.  Neck supple no adenopathy.  Chest: Clear to auscultation bilaterally.  CVS: S1, S2 no murmurs, no S3.  ABD: Soft non tender. Bowel sounds normal.  Ext: No edema  MS: Adequate ROM spine, shoulders, hips and knees.  Skin: hypopigmented lesions, with eruptions both on the face, trunk and upper extremities  Psych: Good eye  contact, normal affect. Memory intact , mildly anxious not  depressed appearing.  CNS: CN 2-12 intact, power, tone and sensation normal throughout.        Assessment & Plan:  Diabetes mellitus with renal manifestation Controlled, no change in medication Patient advised to reduce carb and sweets, commit to regular physical activity, take meds as prescribed, test blood as directed, and attempt to lose weight, to improve blood sugar control.   HYPERTENSION Controlled, no change in medication DASH diet and commitment to daily physical activity for a minimum of 30 minutes discussed and encouraged, as a part of hypertension management. The importance of attaining a healthy weight is also discussed.   HYPOTHYROIDISM Updated lab needed at/ before next visit.   SECONDARY HYPERPARATHYROIDISM Needs to re establish with endo  Morbid obesity with BMI of 45.0-49.9, adult unchanged. Patient re-educated about  the importance of commitment to a  minimum of 150 minutes of exercise per week. The importance of healthy food choices with portion control discussed.Pt to start daily phentemine Encouraged to start a food diary, count calories and to consider  joining a support group. Sample diet sheets offered. Goals set by the patient for the next several months.     Depression Pt overwhelmed with health issue of skin disease, now affecting her face , and lack of success with weight loss, will refer for counseling , she wants to go at this time. She does have excellent support from spouse and family Not suicidal or homicidal. Needs help with self esteem issues   Skin lesions, generalized Worsening, now on methotrexate, but new  are facial lesions in past several weeks, needs to contact dermatology for help with this

## 2013-10-24 NOTE — Assessment & Plan Note (Signed)
Worsening, now on methotrexate, but new are facial lesions in past several weeks, needs to contact dermatology for help with this

## 2013-11-02 ENCOUNTER — Ambulatory Visit: Payer: PRIVATE HEALTH INSURANCE | Admitting: Certified Nurse Midwife

## 2013-11-12 ENCOUNTER — Encounter: Payer: Self-pay | Admitting: Certified Nurse Midwife

## 2013-11-12 ENCOUNTER — Ambulatory Visit (INDEPENDENT_AMBULATORY_CARE_PROVIDER_SITE_OTHER): Payer: BC Managed Care – PPO | Admitting: Certified Nurse Midwife

## 2013-11-12 VITALS — BP 124/78 | HR 88 | Resp 18 | Ht 59.5 in | Wt 245.0 lb

## 2013-11-12 DIAGNOSIS — B372 Candidiasis of skin and nail: Secondary | ICD-10-CM

## 2013-11-12 DIAGNOSIS — Z0000 Encounter for general adult medical examination without abnormal findings: Secondary | ICD-10-CM

## 2013-11-12 DIAGNOSIS — Z01419 Encounter for gynecological examination (general) (routine) without abnormal findings: Secondary | ICD-10-CM

## 2013-11-12 MED ORDER — NYSTATIN 100000 UNIT/GM EX POWD: Freq: Two times a day (BID) | CUTANEOUS | Status: DC

## 2013-11-12 NOTE — Progress Notes (Signed)
53 y.o. G0P0000 Married African American Fe here for annual exam. Menopausal no HRT, denies vaginal bleeding or vaginal dryness. Denies hot flashes and night sweats. Sees PCP for hypertension /diabetes management and aex. Diagnosed with Lupus 12/14, being followed at [facility]. No other health issues. Has started on weight loss program.    Patient's last menstrual period was 10/31/2002.          Sexually active: yes  The current method of family planning is none.    Exercising: no  The patient does not participate in regular exercise at present. Smoker:  no  Health Maintenance: Pap:  10/2011 Neg. HR HPV: Neg MMG:  08/2012 BI-RADS 2: Benign  Colonoscopy:  07/2013 every 10 years BMD:   2006 TDaP:  03/2004 - Will get one this year with PCP Labs: PCP   reports that she has never smoked. She has never used smokeless tobacco. She reports that she does not drink alcohol or use illicit drugs.  Past Medical History  Diagnosis Date  . Thyroid disease   . Hypertension   . Allergy   . Diabetes mellitus   . Vitamin D deficiency   . Obesity   . Secondary hyperthyroidism   . Hypersomnia with sleep apnea   . Arthritis   . Chronic bronchitis   . Fibroid uterus   . Menorrhagia   . Hx of iron deficiency anemia   . Endometriosis   . 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 Abdominal hysterectomy  2004    supracervical hysterectomy  . Breast surgery  2005    reduction  . Dilation and curettage of uterus      age 89    Current Outpatient Prescriptions  Medication Sig Dispense Refill  . ergocalciferol (VITAMIN D2) 50000 UNITS capsule Take 1 capsule (50,000 Units total) by mouth once a week. One capsule once weekly  4 capsule  7  . metFORMIN (GLUCOPHAGE) 1000 MG tablet Take 1 tablet (1,000 mg total) by mouth 2 (two) times daily with a meal.  60 tablet  5  . methotrexate (RHEUMATREX) 2.5 MG tablet Caution:Chemotherapy. Protect from light. Four  tablets once weekly  4 tablet  0  . phentermine (ADIPEX-P) 37.5 MG tablet Take 1 tablet (37.5 mg total) by mouth daily before breakfast.  30 tablet  3  . pravastatin (PRAVACHOL) 10 MG tablet Take 1 tablet (10 mg total) by mouth daily.  30 tablet  11  . triamterene-hydrochlorothiazide (MAXZIDE-25) 37.5-25 MG per tablet Take 1 tablet by mouth daily.  30 tablet  11  . potassium chloride SA (K-DUR,KLOR-CON) 20 MEQ tablet Take 1 tablet (20 mEq total) by mouth 2 (two) times daily.  60 tablet  4   No current facility-administered medications for this visit.    Family History  Problem Relation Age of Onset  . Hypertension Mother   . Diabetes Mother   . Diabetes Father   . Stroke Father   . Hypertension Sister     x 2  . Stroke Maternal Aunt   . Colon cancer Maternal Grandfather     ROS:  Pertinent items are noted in HPI.  Otherwise, a comprehensive ROS was negative.  Exam:   BP 124/78  Pulse 88  Resp 18  Ht 4' 11.5" (1.511 m)  Wt 245 lb (111.131 kg)  BMI 48.68 kg/m2  LMP 10/31/2002 Height: 4' 11.5" (151.1 cm)  Ht Readings from Last 3 Encounters:  11/12/13  4' 11.5" (1.511 m)  10/23/13 5\' 1"  (1.549 m)  08/11/13 5\' 1"  (1.549 m)    General appearance: alert, cooperative and appears stated age Head: Normocephalic, without obvious abnormality, atraumatic Neck: no adenopathy, supple, symmetrical, trachea midline and thyroid normal to inspection and palpation and non-palpable Lungs: clear to auscultation bilaterally Breasts: normal appearance, no masses or tenderness, No nipple retraction or dimpling, No nipple discharge or bleeding, No axillary or supraclavicular adenopathy Heart: regular rate and rhythm Abdomen: soft, non-tender; no masses,  no organomegaly, skin under abdomen scaly with exudate, increase pink no lesions Wet prep taken Extremities: extremities normal, atraumatic, no cyanosis or edema Skin: Skin color, texture, turgor normal. No rashes or lesions Lymph nodes: Cervical,  supraclavicular, and axillary nodes normal. No abnormal inguinal nodes palpated Neurologic: Grossly normal  Wet prep of skin: positive for yeast   Pelvic: External genitalia:  no lesions              Urethra:  normal appearing urethra with no masses, tenderness or lesions              Bartholin's and Skene's: normal                 Vagina: normal appearing vagina with normal color and discharge, no lesions              Cervix: normal, non tender              Pap taken: yes Bimanual Exam:  Uterus:  uterus absent              Adnexa: no mass, fullness, tenderness and bilateral adnexa absent               Rectovaginal: Confirms               Anus:  normal sphincter tone, no lesions  A:  Well Woman with normal exam  Menopausal no HRT,s/p supercervical TAH for fibroids/menorrhagia  Diabetes type 2/hypertension stable with PCP management  Recent diagnosis of Lupus dermatitis with MD management  Yeast dermatitis    P:   Reviewed health and wellness pertinent to exam  Aware of need to advise if vaginal bleeding  Continue follow up as indicated  Reviewed finding of yeast as before, dry area well under abdomen to help reoccurrence  Rx Nystantin powder see order with instructions  pap smear as per guidelines   Mammogram yearly pap smear with HPV reflex  counseled on breast self exam, mammography screening, adequate intake of calcium and vitamin D, diet and exercise return annually or prn  An After Visit Summary was printed and given to the patient.

## 2013-11-12 NOTE — Patient Instructions (Signed)

## 2013-11-13 LAB — IPS PAP TEST WITH REFLEX TO HPV

## 2013-11-13 NOTE — Progress Notes (Signed)
Reviewed personally.  M. Suzanne Arran Fessel, MD.  

## 2013-11-16 ENCOUNTER — Telehealth (HOSPITAL_COMMUNITY): Payer: Self-pay | Admitting: *Deleted

## 2013-12-08 ENCOUNTER — Telehealth: Payer: Self-pay | Admitting: Family Medicine

## 2013-12-08 NOTE — Telephone Encounter (Signed)
noted 

## 2014-02-25 ENCOUNTER — Ambulatory Visit: Payer: BC Managed Care – PPO | Admitting: Family Medicine

## 2014-03-04 ENCOUNTER — Other Ambulatory Visit: Payer: Self-pay | Admitting: Family Medicine

## 2014-03-04 DIAGNOSIS — Z1231 Encounter for screening mammogram for malignant neoplasm of breast: Secondary | ICD-10-CM

## 2014-03-10 ENCOUNTER — Ambulatory Visit (HOSPITAL_COMMUNITY)
Admission: RE | Admit: 2014-03-10 | Discharge: 2014-03-10 | Disposition: A | Discharge status: Home / Self Care | Payer: BC Managed Care – PPO | Source: Ambulatory Visit | Attending: Family Medicine | Admitting: Family Medicine

## 2014-03-10 DIAGNOSIS — Z1231 Encounter for screening mammogram for malignant neoplasm of breast: Secondary | ICD-10-CM

## 2014-04-20 ENCOUNTER — Telehealth: Payer: Self-pay | Admitting: *Deleted

## 2014-04-20 ENCOUNTER — Other Ambulatory Visit: Payer: Self-pay

## 2014-04-20 MED ORDER — METFORMIN HCL 1000 MG PO TABS: 1000.0000 mg | ORAL_TABLET | Freq: Two times a day (BID) | ORAL | Status: DC

## 2014-04-20 NOTE — Telephone Encounter (Signed)
Pt called stating she is out of her metformin, pt has a appt 05/11/14 at 10:15. Please advise [phone] pt uses [facility] in [place].

## 2014-04-20 NOTE — Telephone Encounter (Signed)
Med refilled.

## 2014-05-07 ENCOUNTER — Telehealth: Payer: Self-pay

## 2014-05-07 ENCOUNTER — Other Ambulatory Visit: Payer: Self-pay | Admitting: Family Medicine

## 2014-05-07 DIAGNOSIS — I10 Essential (primary) hypertension: Secondary | ICD-10-CM

## 2014-05-07 DIAGNOSIS — E039 Hypothyroidism, unspecified: Secondary | ICD-10-CM

## 2014-05-07 DIAGNOSIS — Z13220 Encounter for screening for lipoid disorders: Secondary | ICD-10-CM

## 2014-05-07 DIAGNOSIS — E1129 Type 2 diabetes mellitus with other diabetic kidney complication: Secondary | ICD-10-CM

## 2014-05-07 NOTE — Telephone Encounter (Signed)
Labs ordered and faxed to [facility]  

## 2014-05-08 LAB — COMPLETE METABOLIC PANEL WITH GFR
ALK PHOS: 50 U/L (ref 39–117)
ALT: 8 U/L (ref 0–35)
AST: 12 U/L (ref 0–37)
Albumin: 3.8 g/dL (ref 3.5–5.2)
BILIRUBIN TOTAL: 0.3 mg/dL (ref 0.2–1.2)
BUN: 18 mg/dL (ref 6–23)
CO2: 32 mEq/L (ref 19–32)
Calcium: 8.5 mg/dL (ref 8.4–10.5)
Chloride: 98 mEq/L (ref 96–112)
Creat: 1.15 mg/dL — ABNORMAL HIGH (ref 0.50–1.10)
GFR, EST NON AFRICAN AMERICAN: 56 mL/min — AB
GFR, Est African American: 64 mL/min
Glucose, Bld: 104 mg/dL — ABNORMAL HIGH (ref 70–99)
Potassium: 3.9 mEq/L (ref 3.5–5.3)
Sodium: 141 mEq/L (ref 135–145)
Total Protein: 7.3 g/dL (ref 6.0–8.3)

## 2014-05-08 LAB — HEMOGLOBIN A1C
Hgb A1c MFr Bld: 6.4 % — ABNORMAL HIGH (ref <5.7)
MEAN PLASMA GLUCOSE: 137 mg/dL — AB (ref <117)

## 2014-05-08 LAB — LIPID PANEL
Cholesterol: 146 mg/dL (ref 0–200)
HDL: 46 mg/dL (ref >39)
LDL CALC: 72 mg/dL (ref 0–99)
TRIGLYCERIDES: 141 mg/dL (ref <150)
Total CHOL/HDL Ratio: 3.2 Ratio
VLDL: 28 mg/dL (ref 0–40)

## 2014-05-08 LAB — TSH: TSH: 5.209 u[IU]/mL — ABNORMAL HIGH (ref 0.350–4.500)

## 2014-05-11 ENCOUNTER — Encounter: Payer: Self-pay | Admitting: Family Medicine

## 2014-05-11 ENCOUNTER — Ambulatory Visit: Payer: BC Managed Care – PPO | Admitting: Family Medicine

## 2014-05-11 VITALS — BP 118/74 | HR 99 | Resp 16 | Ht 61.0 in | Wt 245.4 lb

## 2014-05-11 DIAGNOSIS — M25551 Pain in right hip: Secondary | ICD-10-CM

## 2014-05-11 DIAGNOSIS — E1129 Type 2 diabetes mellitus with other diabetic kidney complication: Secondary | ICD-10-CM

## 2014-05-11 DIAGNOSIS — J209 Acute bronchitis, unspecified: Secondary | ICD-10-CM

## 2014-05-11 DIAGNOSIS — M25559 Pain in unspecified hip: Secondary | ICD-10-CM

## 2014-05-11 DIAGNOSIS — Z6841 Body Mass Index (BMI) 40.0 and over, adult: Secondary | ICD-10-CM

## 2014-05-11 DIAGNOSIS — I10 Essential (primary) hypertension: Secondary | ICD-10-CM

## 2014-05-11 DIAGNOSIS — Z23 Encounter for immunization: Secondary | ICD-10-CM | POA: Diagnosis not present

## 2014-05-11 MED ORDER — CEFTRIAXONE SODIUM 1 G IJ SOLR
500.0000 mg | Freq: Once | INTRAMUSCULAR | Status: AC
  Administered 2014-05-11: 500 mg via INTRAMUSCULAR

## 2014-05-11 MED ORDER — IBUPROFEN 800 MG PO TABS: ORAL_TABLET | ORAL | Status: DC

## 2014-05-11 MED ORDER — LEVOFLOXACIN 500 MG PO TABS: 500.0000 mg | ORAL_TABLET | Freq: Every day | ORAL | Status: DC

## 2014-05-11 NOTE — Patient Instructions (Addendum)
F/u in 4 month, call if you need me before  Discusss skin condition and get clear understanding at next visit with your dermatologist  Flu vaccine today Rocephin and levaquin for bronchitis, sugar free robitussin cough  Blood sugar slightly increased , so work on this please  Pls sched appt for f/u thyroid with Nida, lab will be sent  HBA1C, chem 7 and EGFR 4 month  Ibuprofen as discussed , use SPARINGLY and infrequently   

## 2014-05-13 ENCOUNTER — Telehealth: Payer: Self-pay

## 2014-05-13 ENCOUNTER — Other Ambulatory Visit: Payer: Self-pay

## 2014-05-13 DIAGNOSIS — J209 Acute bronchitis, unspecified: Secondary | ICD-10-CM

## 2014-05-13 DIAGNOSIS — I10 Essential (primary) hypertension: Secondary | ICD-10-CM

## 2014-05-13 MED ORDER — PREDNISONE (PAK) 5 MG PO TABS: 5.0000 mg | ORAL_TABLET | ORAL | Status: DC

## 2014-05-13 MED ORDER — TRIAMTERENE-HCTZ 37.5-25 MG PO TABS: 1.0000 | ORAL_TABLET | Freq: Every day | ORAL | Status: DC

## 2014-05-13 MED ORDER — METHYLPREDNISOLONE ACETATE 80 MG/ML IJ SUSP
80.0000 mg | Freq: Once | INTRAMUSCULAR | Status: AC
  Administered 2014-05-13: 80 mg via INTRAMUSCULAR

## 2014-05-13 NOTE — Telephone Encounter (Signed)
Patient aware and will come in

## 2014-05-13 NOTE — Telephone Encounter (Signed)
Still having a dry cough and she has a speaking engagement Sunday and wants a prednisone dose pak [facility] to [facility]. Sabrina [name] its the only thing that works for her

## 2014-05-13 NOTE — Telephone Encounter (Signed)
pls let her know it is sent in, also offer depo medrol 80 mg Im

## 2014-05-17 ENCOUNTER — Telehealth: Payer: Self-pay

## 2014-05-17 DIAGNOSIS — J209 Acute bronchitis, unspecified: Secondary | ICD-10-CM

## 2014-05-17 NOTE — Telephone Encounter (Signed)
noted thanks  

## 2014-05-17 NOTE — Telephone Encounter (Signed)
Patient states that she has a pain in right rib area when coughing.  Is requesting a cxr.  Order placed.

## 2014-05-18 ENCOUNTER — Ambulatory Visit (HOSPITAL_COMMUNITY)
Admission: RE | Admit: 2014-05-18 | Discharge: 2014-05-18 | Disposition: A | Discharge status: Home / Self Care | Payer: BC Managed Care – PPO | Source: Ambulatory Visit | Attending: Family Medicine | Admitting: Family Medicine

## 2014-05-18 DIAGNOSIS — J209 Acute bronchitis, unspecified: Secondary | ICD-10-CM

## 2014-05-18 DIAGNOSIS — R0602 Shortness of breath: Secondary | ICD-10-CM | POA: Diagnosis not present

## 2014-05-18 DIAGNOSIS — R079 Chest pain, unspecified: Secondary | ICD-10-CM | POA: Diagnosis not present

## 2014-05-18 DIAGNOSIS — R05 Cough: Secondary | ICD-10-CM | POA: Insufficient documentation

## 2014-05-19 ENCOUNTER — Telehealth: Payer: Self-pay

## 2014-05-19 MED ORDER — PROMETHAZINE-DM 6.25-15 MG/5ML PO SYRP: 5.0000 mL | ORAL_SOLUTION | Freq: Two times a day (BID) | ORAL | Status: DC | PRN

## 2014-05-19 MED ORDER — ALBUTEROL SULFATE HFA 108 (90 BASE) MCG/ACT IN AERS: 2.0000 | INHALATION_SPRAY | Freq: Four times a day (QID) | RESPIRATORY_TRACT | Status: DC | PRN

## 2014-05-19 MED ORDER — AZITHROMYCIN 250 MG PO TABS: ORAL_TABLET | ORAL | Status: DC

## 2014-05-19 NOTE — Telephone Encounter (Signed)
meds sent to requested pharmacy.  Patient made aware via answering machine as requested.

## 2014-05-19 NOTE — Addendum Note (Signed)
Addended by: Denman George B on: 05/19/2014 11:57 AM   Modules accepted: Orders

## 2014-05-19 NOTE — Telephone Encounter (Signed)
pls send in z pack, albuterol mdi 2 puffs every 6 hours as needed, and phenergan DNM one teaspoon twice daily as needed for excess cough x 240 cc, let her know cough syrup is sedating so limit to nightime use Pls ask isf she has sinus pressure and drainage and l if this is so, she will need xray of sinus also, let me know

## 2014-06-30 ENCOUNTER — Telehealth: Payer: Self-pay | Admitting: Family Medicine

## 2014-06-30 DIAGNOSIS — I83813 Varicose veins of bilateral lower extremities with pain: Secondary | ICD-10-CM

## 2014-06-30 NOTE — Addendum Note (Signed)
Addended by: Eual Fines on: 06/30/2014 12:45 PM   Modules accepted: Orders

## 2014-06-30 NOTE — Telephone Encounter (Signed)
pls enter referral I will sign, painful veins/ siuperficial varicose veins, inflamed

## 2014-06-30 NOTE — Telephone Encounter (Signed)
Referral entered. Sabrina [name] will call with appt info

## 2014-07-14 ENCOUNTER — Other Ambulatory Visit: Payer: Self-pay | Admitting: *Deleted

## 2014-07-14 DIAGNOSIS — I83813 Varicose veins of bilateral lower extremities with pain: Secondary | ICD-10-CM

## 2014-08-23 ENCOUNTER — Encounter: Payer: BC Managed Care – PPO | Admitting: Surgery

## 2014-08-23 ENCOUNTER — Encounter (HOSPITAL_COMMUNITY): Payer: BC Managed Care – PPO

## 2014-09-07 ENCOUNTER — Other Ambulatory Visit: Payer: Self-pay | Admitting: Family Medicine

## 2014-09-07 DIAGNOSIS — D509 Iron deficiency anemia, unspecified: Secondary | ICD-10-CM

## 2014-09-07 LAB — HEMOGLOBIN A1C
Hgb A1c MFr Bld: 6.5 % — ABNORMAL HIGH (ref <5.7)
MEAN PLASMA GLUCOSE: 140 mg/dL — AB (ref <117)

## 2014-09-07 LAB — COMPLETE METABOLIC PANEL WITH GFR
ALK PHOS: 50 U/L (ref 39–117)
ALT: 12 U/L (ref 0–35)
AST: 15 U/L (ref 0–37)
Albumin: 3.9 g/dL (ref 3.5–5.2)
BUN: 18 mg/dL (ref 6–23)
CALCIUM: 8.7 mg/dL (ref 8.4–10.5)
CO2: 30 meq/L (ref 19–32)
Chloride: 98 mEq/L (ref 96–112)
Creat: 1.08 mg/dL (ref 0.50–1.10)
GFR, Est African American: 69 mL/min
GFR, Est Non African American: 60 mL/min
Glucose, Bld: 127 mg/dL — ABNORMAL HIGH (ref 70–99)
Potassium: 3.6 mEq/L (ref 3.5–5.3)
Sodium: 138 mEq/L (ref 135–145)
Total Bilirubin: 0.4 mg/dL (ref 0.2–1.2)
Total Protein: 7.5 g/dL (ref 6.0–8.3)

## 2014-09-09 ENCOUNTER — Ambulatory Visit (INDEPENDENT_AMBULATORY_CARE_PROVIDER_SITE_OTHER): Payer: BLUE CROSS/BLUE SHIELD | Admitting: Family Medicine

## 2014-09-09 ENCOUNTER — Encounter: Payer: Self-pay | Admitting: Family Medicine

## 2014-09-09 VITALS — BP 120/78 | HR 82 | Resp 16 | Ht 61.0 in | Wt 254.8 lb

## 2014-09-09 DIAGNOSIS — E1129 Type 2 diabetes mellitus with other diabetic kidney complication: Secondary | ICD-10-CM

## 2014-09-09 DIAGNOSIS — E785 Hyperlipidemia, unspecified: Secondary | ICD-10-CM

## 2014-09-09 DIAGNOSIS — D509 Iron deficiency anemia, unspecified: Secondary | ICD-10-CM

## 2014-09-09 DIAGNOSIS — Z6841 Body Mass Index (BMI) 40.0 and over, adult: Secondary | ICD-10-CM

## 2014-09-09 DIAGNOSIS — F32A Depression, unspecified: Secondary | ICD-10-CM

## 2014-09-09 DIAGNOSIS — I10 Essential (primary) hypertension: Secondary | ICD-10-CM

## 2014-09-09 DIAGNOSIS — E559 Vitamin D deficiency, unspecified: Secondary | ICD-10-CM

## 2014-09-09 DIAGNOSIS — E038 Other specified hypothyroidism: Secondary | ICD-10-CM

## 2014-09-09 DIAGNOSIS — F329 Major depressive disorder, single episode, unspecified: Secondary | ICD-10-CM

## 2014-09-09 LAB — FERRITIN: Ferritin: 166 ng/mL (ref 10–291)

## 2014-09-09 LAB — TSH: TSH: 6.906 u[IU]/mL — ABNORMAL HIGH (ref 0.350–4.500)

## 2014-09-09 LAB — IRON: Iron: 39 ug/dL — ABNORMAL LOW (ref 42–145)

## 2014-09-09 MED ORDER — PRAVASTATIN SODIUM 10 MG PO TABS: 10.0000 mg | ORAL_TABLET | Freq: Every day | ORAL | Status: DC

## 2014-09-09 MED ORDER — PHENTERMINE HCL 37.5 MG PO TABS: 37.5000 mg | ORAL_TABLET | Freq: Every day | ORAL | Status: DC

## 2014-09-09 NOTE — Patient Instructions (Addendum)
F/u in 4 month, call if you need me before  Labs will be added to recent blood work , you will be contacted if needed  Pls let us know name of Doc to send your TSH result to  Fasting lipid, cmp and EGFR, hBa1C, vit D in 4 month  Resume phentermine one daily  Information is provided to attend class for bariatric surgery in [place], I do believe that this is a good option for you  It is important that you exercise regularly at least 30 minutes 5 times a week. If you develop chest pain, have severe difficulty breathing, or feel very tired, stop exercising immediately and seek medical attention    Foot exam today is good

## 2014-09-09 NOTE — Progress Notes (Signed)
Subjective:    Patient ID: Sabrina [name], female    DOB: 1961/08/08, 54 y.o.   MRN: [id number]  HPI The PT is here for follow up and re-evaluation of chronic medical conditions, medication management and review of any available recent lab and radiology data.  Preventive health is updated, specifically  Cancer screening and Immunization.   Recently, this past weekend , she had an allergic reaction to medication she was started on for treatment of her lupus with severe facial swelling, no airway compromise however, currently in high dose prednisone to address this Had a colonoscopy in 07/2014, which showed tubular adenoma, needs repeat in 3 years.  He is very concerned about her inability to lose weight , wants to resume phentermine and is interested in bariatric surgery      Review of Systems See HPI Denies recent fever or chills. Denies sinus pressure, nasal congestion, ear pain or sore throat. Denies chest congestion, productive cough or wheezing. Denies chest pains, palpitations and leg swelling Denies abdominal pain, nausea, vomiting,diarrhea or constipation.   Denies dysuria, frequency, hesitancy or incontinence. Denies joint pain, swelling and limitation in mobility. Denies headaches, seizures, numbness, or tingling. Denies depression, anxiety or insomnia. Denies skin break down or rash.        Objective:   Physical Exam BP 120/78 mmHg  Pulse 82  Resp 16  Ht 5\' 1"  (1.549 m)  Wt 254 lb 12.8 oz (115.577 kg)  BMI 48.17 kg/m2  SpO2 98%  LMP 10/31/2002  Patient alert and oriented and in no cardiopulmonary distress.  HEENT: No facial asymmetry, EOMI,   oropharynx pink and moist.  Neck supple no JVD, no mass.  Chest: Clear to auscultation bilaterally.  CVS: S1, S2 no murmurs, no S3.Regular rate.  ABD: Soft non tender.   Ext: No edema  MS: Adequate ROM spine, shoulders, hips and knees.  Skin: Intact, no ulcerations or rash noted.  Psych: Good eye  contact, normal affect. Memory intact not anxious or depressed appearing.  CNS: CN 2-12 intact, power,  normal throughout.no focal deficits noted.        Assessment & Plan:  Essential hypertension Controlled, no change in medication DASH diet and commitment to daily physical activity for a minimum of 30 minutes discussed and encouraged, as a part of hypertension management. The importance of attaining a healthy weight is also discussed.    Diabetes mellitus with renal manifestation Controlled, no change in medication Patient advised to reduce carb and sweets, commit to regular physical activity, take meds as prescribed, test blood as directed, and attempt to lose weight, to improve blood sugar control.    Hypothyroidism Followed by endo, updated lab  Will be obtained and faxed to responsible MD, pt currently on no medication, has appt in the next 2 to 3 weeks reportedly   Iron deficiency anemia Had colonoscopy in 07/2013. Needs to ensure she is taking iron 325mg  twice daily, will f/u in 4 months, if still low needs Heme consult as may need IV iron, she has a hysterectomy   Depression Improved, she did see counselor once and found this useful, no need for therapy at this time, improved    Morbid obesity with BMI of 45.0-49.9, adult Deteriorated. Patient re-educated about  the importance of commitment to a  minimum of 150 minutes of exercise per week. The importance of healthy food choices with portion control discussed. Encouraged to start a food diary, count calories and to consider  joining a support group.  Sample diet sheets offered. Goals set by the patient for the next several months.   Discussed with patient seriously considering surgical intervention for her obesity management, based onn her multiple co morbidities and her inability to lose weight over the years , as long as her lupus is not a contraindication, I think that this is a good option for her She has been  provided with info to move forward with general conference if she wishes, i have explained that this is the beginning of the conversation, and I am aware that it takes time. She states her insurance was the reason that she had not pursued this in the past   Vitamin D deficiency Updated lab needed at/ before next visit.    Hyperlipidemia LDL goal <100 Hyperlipidemia:Low fat diet discussed and encouraged.  Controlled, no change in medication  Updated lab needed at/ before next visit.

## 2014-09-10 LAB — MICROALBUMIN / CREATININE URINE RATIO
Creatinine, Urine: 110.1 mg/dL
Microalb Creat Ratio: 19.1 mg/g (ref 0.0–30.0)
Microalb, Ur: 2.1 mg/dL — ABNORMAL HIGH (ref <2.0)

## 2014-09-10 LAB — T4, FREE: FREE T4: 0.89 ng/dL (ref 0.80–1.80)

## 2014-09-10 LAB — T3, FREE: T3 FREE: 2.4 pg/mL (ref 2.3–4.2)

## 2014-09-11 ENCOUNTER — Encounter: Payer: Self-pay | Admitting: Family Medicine

## 2014-09-11 DIAGNOSIS — D126 Benign neoplasm of colon, unspecified: Secondary | ICD-10-CM | POA: Insufficient documentation

## 2014-09-11 DIAGNOSIS — E785 Hyperlipidemia, unspecified: Secondary | ICD-10-CM | POA: Insufficient documentation

## 2014-09-11 DIAGNOSIS — D509 Iron deficiency anemia, unspecified: Secondary | ICD-10-CM

## 2014-09-11 HISTORY — DX: Iron deficiency anemia, unspecified: D50.9

## 2014-09-11 NOTE — Assessment & Plan Note (Addendum)
Deteriorated. Patient re-educated about  the importance of commitment to a  minimum of 150 minutes of exercise per week. The importance of healthy food choices with portion control discussed. Encouraged to start a food diary, count calories and to consider  joining a support group. Sample diet sheets offered. Goals set by the patient for the next several months.   Discussed with patient seriously considering surgical intervention for her obesity management, based onn her multiple co morbidities and her inability to lose weight over the years , as long as her lupus is not a contraindication, I think that this is a good option for her She has been provided with info to move forward with general conference if she wishes, i have explained that this is the beginning of the conversation, and I am aware that it takes time. She states her insurance was the reason that she had not pursued this in the past

## 2014-09-11 NOTE — Assessment & Plan Note (Signed)
Hyperlipidemia:Low fat diet discussed and encouraged.  Controlled, no change in medication Updated lab needed at/ before next visit.  

## 2014-09-11 NOTE — Assessment & Plan Note (Signed)
Controlled, no change in medication Patient advised to reduce carb and sweets, commit to regular physical activity, take meds as prescribed, test blood as directed, and attempt to lose weight, to improve blood sugar control.  

## 2014-09-11 NOTE — Assessment & Plan Note (Signed)
Had colonoscopy in 07/2013. Needs to ensure she is taking iron 325mg  twice daily, will f/u in 4 months, if still low needs Heme consult as may need IV iron, she has a hysterectomy

## 2014-09-11 NOTE — Assessment & Plan Note (Signed)
Controlled, no change in medication DASH diet and commitment to daily physical activity for a minimum of 30 minutes discussed and encouraged, as a part of hypertension management. The importance of attaining a healthy weight is also discussed.  

## 2014-09-11 NOTE — Assessment & Plan Note (Signed)
Updated lab needed at/ before next visit.   

## 2014-09-11 NOTE — Assessment & Plan Note (Signed)
Followed by endo, updated lab  Will be obtained and faxed to responsible MD, pt currently on no medication, has appt in the next 2 to 3 weeks reportedly

## 2014-09-11 NOTE — Assessment & Plan Note (Signed)
Improved, she did see counselor once and found this useful, no need for therapy at this time, improved

## 2014-09-14 NOTE — Addendum Note (Signed)
Addended by: Denman George B on: 09/14/2014 05:01 PM   Modules accepted: Orders

## 2014-09-15 LAB — HM DIABETES EYE EXAM

## 2014-10-11 ENCOUNTER — Encounter (HOSPITAL_COMMUNITY): Payer: Self-pay | Admitting: Emergency Medicine

## 2014-10-11 ENCOUNTER — Emergency Department (HOSPITAL_COMMUNITY)
Admission: EM | Admit: 2014-10-11 | Discharge: 2014-10-11 | Disposition: A | Discharge status: Home / Self Care | Payer: BLUE CROSS/BLUE SHIELD | Attending: Emergency Medicine | Admitting: Emergency Medicine

## 2014-10-11 ENCOUNTER — Ambulatory Visit: Payer: BC Managed Care – PPO | Admitting: Endocrinology

## 2014-10-11 ENCOUNTER — Ambulatory Visit (INDEPENDENT_AMBULATORY_CARE_PROVIDER_SITE_OTHER): Payer: BLUE CROSS/BLUE SHIELD | Admitting: Endocrinology

## 2014-10-11 ENCOUNTER — Emergency Department (HOSPITAL_COMMUNITY): Payer: BLUE CROSS/BLUE SHIELD

## 2014-10-11 ENCOUNTER — Encounter: Payer: Self-pay | Admitting: Endocrinology

## 2014-10-11 VITALS — BP 144/90 | HR 106 | Temp 98.3°F | Ht 61.0 in | Wt 242.0 lb

## 2014-10-11 DIAGNOSIS — R110 Nausea: Secondary | ICD-10-CM

## 2014-10-11 DIAGNOSIS — E1165 Type 2 diabetes mellitus with hyperglycemia: Secondary | ICD-10-CM | POA: Insufficient documentation

## 2014-10-11 DIAGNOSIS — Z8739 Personal history of other diseases of the musculoskeletal system and connective tissue: Secondary | ICD-10-CM | POA: Insufficient documentation

## 2014-10-11 DIAGNOSIS — E559 Vitamin D deficiency, unspecified: Secondary | ICD-10-CM | POA: Diagnosis not present

## 2014-10-11 DIAGNOSIS — Z91040 Latex allergy status: Secondary | ICD-10-CM | POA: Insufficient documentation

## 2014-10-11 DIAGNOSIS — M3210 Systemic lupus erythematosus, organ or system involvement unspecified: Secondary | ICD-10-CM | POA: Diagnosis not present

## 2014-10-11 DIAGNOSIS — Z862 Personal history of diseases of the blood and blood-forming organs and certain disorders involving the immune mechanism: Secondary | ICD-10-CM | POA: Diagnosis not present

## 2014-10-11 DIAGNOSIS — Z79899 Other long term (current) drug therapy: Secondary | ICD-10-CM | POA: Insufficient documentation

## 2014-10-11 DIAGNOSIS — Z792 Long term (current) use of antibiotics: Secondary | ICD-10-CM | POA: Diagnosis not present

## 2014-10-11 DIAGNOSIS — Z8742 Personal history of other diseases of the female genital tract: Secondary | ICD-10-CM | POA: Diagnosis not present

## 2014-10-11 DIAGNOSIS — E876 Hypokalemia: Secondary | ICD-10-CM | POA: Insufficient documentation

## 2014-10-11 DIAGNOSIS — I10 Essential (primary) hypertension: Secondary | ICD-10-CM | POA: Insufficient documentation

## 2014-10-11 DIAGNOSIS — H538 Other visual disturbances: Secondary | ICD-10-CM

## 2014-10-11 DIAGNOSIS — E1129 Type 2 diabetes mellitus with other diabetic kidney complication: Secondary | ICD-10-CM

## 2014-10-11 DIAGNOSIS — R000 Tachycardia, unspecified: Secondary | ICD-10-CM | POA: Diagnosis not present

## 2014-10-11 DIAGNOSIS — R631 Polydipsia: Secondary | ICD-10-CM

## 2014-10-11 DIAGNOSIS — R739 Hyperglycemia, unspecified: Secondary | ICD-10-CM

## 2014-10-11 LAB — GLUCOSE, POCT (MANUAL RESULT ENTRY): POC Glucose: 540 mg/dl — AB (ref 70–99)

## 2014-10-11 LAB — BASIC METABOLIC PANEL
ANION GAP: 10 (ref 5–15)
BUN: 26 mg/dL — ABNORMAL HIGH (ref 6–23)
CALCIUM: 8 mg/dL — AB (ref 8.4–10.5)
CHLORIDE: 89 mmol/L — AB (ref 96–112)
CO2: 29 mmol/L (ref 19–32)
Creatinine, Ser: 1.5 mg/dL — ABNORMAL HIGH (ref 0.50–1.10)
GFR calc Af Amer: 45 mL/min — ABNORMAL LOW (ref >90)
GFR calc non Af Amer: 39 mL/min — ABNORMAL LOW (ref >90)
Glucose, Bld: 556 mg/dL (ref 70–99)
Potassium: 3.4 mmol/L — ABNORMAL LOW (ref 3.5–5.1)
SODIUM: 128 mmol/L — AB (ref 135–145)

## 2014-10-11 LAB — URINALYSIS, ROUTINE W REFLEX MICROSCOPIC
Bilirubin Urine: NEGATIVE
Glucose, UA: >1000 mg/dL — AB
Ketones, ur: 15 mg/dL — AB
Leukocytes, UA: NEGATIVE
Nitrite: NEGATIVE
PROTEIN: 30 mg/dL — AB
Specific Gravity, Urine: 1.03 (ref 1.005–1.030)
Urobilinogen, UA: 0.2 mg/dL (ref 0.0–1.0)
pH: 6 (ref 5.0–8.0)

## 2014-10-11 LAB — CBG MONITORING, ED
GLUCOSE-CAPILLARY: 558 mg/dL — AB (ref 70–99)
Glucose-Capillary: 549 mg/dL — ABNORMAL HIGH (ref 70–99)

## 2014-10-11 LAB — I-STAT CHEM 8, ED
BUN: 27 mg/dL — ABNORMAL HIGH (ref 6–23)
Calcium, Ion: 1 mmol/L — ABNORMAL LOW (ref 1.12–1.23)
Chloride: 95 mmol/L — ABNORMAL LOW (ref 96–112)
Creatinine, Ser: 1.2 mg/dL — ABNORMAL HIGH (ref 0.50–1.10)
Glucose, Bld: 325 mg/dL — ABNORMAL HIGH (ref 70–99)
HCT: 38 % (ref 36.0–46.0)
Hemoglobin: 12.9 g/dL (ref 12.0–15.0)
Potassium: 2.9 mmol/L — ABNORMAL LOW (ref 3.5–5.1)
Sodium: 137 mmol/L (ref 135–145)
TCO2: 25 mmol/L (ref 0–100)

## 2014-10-11 LAB — I-STAT TROPONIN, ED: Troponin i, poc: 0 ng/mL (ref 0.00–0.08)

## 2014-10-11 LAB — URINE MICROSCOPIC-ADD ON

## 2014-10-11 MED ORDER — INSULIN ASPART 100 UNIT/ML IV SOLN
10.0000 [IU] | Freq: Once | INTRAVENOUS | Status: AC
  Administered 2014-10-11: 10 [IU] via INTRAVENOUS
  Filled 2014-10-11: qty 1

## 2014-10-11 MED ORDER — POTASSIUM CHLORIDE CRYS ER 20 MEQ PO TBCR
40.0000 meq | EXTENDED_RELEASE_TABLET | Freq: Once | ORAL | Status: AC
  Administered 2014-10-11: 40 meq via ORAL
  Filled 2014-10-11: qty 2

## 2014-10-11 MED ORDER — SODIUM CHLORIDE 0.9 % IV BOLUS (SEPSIS)
2000.0000 mL | Freq: Once | INTRAVENOUS | Status: AC
  Administered 2014-10-11: 2000 mL via INTRAVENOUS

## 2014-10-11 NOTE — Addendum Note (Signed)
Addended by: Moody Bruins E on: 10/11/2014 05:02 PM   Modules accepted: Orders

## 2014-10-11 NOTE — ED Notes (Signed)
Pt reports she has been taking prednisone for allergic rx to medication. Seen at pcp and sent here for evaluation of hyperglycemia. Pt c/o blurred vision and nv.

## 2014-10-11 NOTE — Patient Instructions (Addendum)
Go to [facility] ER now. i called, and they are expecting you. i have asked them to draw the vitamin-D and parathyroid blood tests, and send results to me. If they send you home, and you need follow-up in the next 1-2 days, either Dr Moshe Cipro or I would be happy to do so.   Our office can teach you how to inject insulin tomorrow or Wednesday if necessary.

## 2014-10-11 NOTE — Progress Notes (Signed)
Subjective:    Patient ID: Sabrina [name], female    DOB: 21-Jan-1961, 54 y.o.   MRN: [id number]  HPI  pt states DM was dx'ed in 2012; he has mild if any neuropathy of the lower extremities; he is unaware of any associated chronic complications; he has never been on insulin; pt says her diet and exercise are not good; he has never had GDM, pancreatitis, severe hypoglycemia or DKA.  She has been on prednisone x 1 month, for cutaneous Lupus.   Pt also returns to f/u secondary hyperparathyroidism (dx'ed 2009). No cause was found. It was also never determined why her labs were resistant to 25-OH-vit-d supplementation. She has not recently taken rocaltrol. She has moderate cramps of the legs, and and assoc numbness.   Past Medical History  Diagnosis Date  . Thyroid disease   . Hypertension   . Allergy   . Diabetes mellitus   . Vitamin D deficiency   . Obesity   . Secondary hyperthyroidism   . Hypersomnia with sleep apnea   . Arthritis   . Chronic bronchitis   . Fibroid uterus   . Menorrhagia   . Hx of iron deficiency anemia   . Endometriosis   . 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 Abdominal hysterectomy  2004    supracervical hysterectomy  . Breast surgery  2005    reduction  . Dilation and curettage of uterus      age 89    History   Social History  . Marital Status: Married    Spouse Name: [name]  . Number of Children: [name]  . Years of Education: 13   Occupational History  . Housing Specialist    Social History Main Topics  . Smoking status: Never Smoker   . Smokeless tobacco: Never Used  . Alcohol Use: No  . Drug Use: No  . Sexual Activity:    Partners: Male    Birth Control/ Protection: None   Other Topics Concern  . Not on file   Social History Narrative   Regular exercise-no   Caffeine Use-no          Current Outpatient Prescriptions on File Prior to Visit  Medication Sig Dispense Refill  .  ergocalciferol (VITAMIN D2) 50000 UNITS capsule Take 1 capsule (50,000 Units total) by mouth once a week. One capsule once weekly 4 capsule 7  . metFORMIN (GLUCOPHAGE) 1000 MG tablet Take 1 tablet (1,000 mg total) by mouth 2 (two) times daily with a meal. 60 tablet 5  . methotrexate (RHEUMATREX) 2.5 MG tablet Caution:Chemotherapy. Protect from light. Four tablets once weekly 4 tablet 0  . mupirocin cream (BACTROBAN) 2 % Apply 1 application topically daily.    [name] nystatin (MYCOSTATIN) powder Apply topically 2 (two) times daily. Apply to affected area for up to 7 days 60 g 1  . phentermine (ADIPEX-P) 37.5 MG tablet Take 1 tablet (37.5 mg total) by mouth daily before breakfast. 30 tablet 3  . potassium chloride SA (K-DUR,KLOR-CON) 20 MEQ tablet Take 1 tablet (20 mEq total) by mouth 2 (two) times daily. 60 tablet 4  . pravastatin (PRAVACHOL) 10 MG tablet Take 1 tablet (10 mg total) by mouth daily. 30 tablet 5  . triamterene-hydrochlorothiazide (MAXZIDE-25) 37.5-25 MG per tablet Take 1 tablet by mouth daily. 30 tablet 5   No current facility-administered medications on file prior to visit.    Allergies  Allergen Reactions  .  Ace Inhibitors Cough  . Codeine Nausea And Vomiting  . Peanut-Containing Drug Products Nausea And Vomiting  . Potassium Iodide Other (See Comments)    facila swelling, also eyes , and ears  . Amlodipine Rash  . Latex Hives and Rash    Family History  Problem Relation Age of Onset  . Hypertension Mother   . Diabetes Mother   . Diabetes Father   . Stroke Father   . Hypertension Sister     x 2  . Stroke Maternal Aunt   . Colon cancer Maternal Grandfather     BP 144/90 mmHg  Pulse 106  Temp(Src) 98.3 F (36.8 C) (Oral)  Ht 5\' 1"  (1.549 m)  Wt 242 lb (109.77 kg)  BMI 45.75 kg/m2  SpO2 97%  LMP 10/31/2002  Review of Systems denies headache, chest pain, fever, rhinorrhea, and easy bruising.  She has lost 14 lbs x 1 month.  She has blurry vision, anxiety, cold  intolerance, excessive diaphoresis, and chronic doe, urinary frequency, and nausea.      Objective:   Physical Exam VITAL SIGNS:  See vs page GENERAL: no distress Pulses: dorsalis pedis intact bilat.   MSK: no deformity of the feet CV: no leg edema.   Skin:  no ulcer on the feet.  normal color and temp on the feet.  Several healing ulcers on the legs.   Neuro: sensation is intact to touch on the feet.        Assessment & Plan:  DM: new to me, severe exarbation, due to steroids Vit-D def, uncertain etiology Secondary hyperparathyroidism.  She needs repeat labs for this. Med and f/u ov noncompliance, persistent     Patient is advised the following: Patient Instructions  Go to [facility] ER now. i called, and they are expecting you. i have asked them to draw the vitamin-D and parathyroid blood tests, and send results to me. If they send you home, and you need follow-up in the next 1-2 days, either Dr Moshe Cipro or I would be happy to do so.   Our office can teach you how to inject insulin tomorrow or Wednesday if necessary.

## 2014-10-11 NOTE — ED Notes (Signed)
Dr. Ebony Hail, patient's endocrinologist, called requesting Vitamin D and parathyroid level. States he will follow-up with the results but would like them drawn here since he had to send her over.

## 2014-10-11 NOTE — ED Provider Notes (Signed)
CSN: [id number]     Arrival date & time 10/11/14  1645 History   First MD Initiated Contact with Patient 10/11/14 1838     Chief Complaint  Patient presents with  . Hyperglycemia     (Consider location/radiation/quality/duration/timing/severity/associated sxs/prior Treatment) HPI Pt is a 53yo female with hx of thyroid disease, HTN, Type II NIDDM, vitamin D deficiency, secondary hyperthyroidism, iron deficiency anemia, and lupus of skin, sent to ED by her PCP for further evaluation of hyperglycemia.  Pt states she has been on prednisone for 23 days, last day is tomorrow. She was placed on prednisone for an allergic reaction to a medication she was taking for her lupus. She reports generalized fatigue, nausea, blurred vision, intermittent SOB and centralized chest pain. Pt reports nausea but denies CP or SOB at this time.  States she does not check her CBG regularly at home but states she has never been on insulin.     Past Medical History  Diagnosis Date  . Thyroid disease   . Hypertension   . Allergy   . Diabetes mellitus   . Vitamin D deficiency   . Obesity   . Secondary hyperthyroidism   . Hypersomnia with sleep apnea   . Arthritis   . Chronic bronchitis   . Fibroid uterus   . Menorrhagia   . Hx of iron deficiency anemia   . Endometriosis   . Lupus 07/2013    Of Skin   Past Surgical History  Procedure Laterality Date  . Abdominal hysterectomy  2004    supracervical hysterectomy  . Breast surgery  2005    reduction  . Dilation and curettage of uterus      age 26   Family History  Problem Relation Age of Onset  . Hypertension Mother   . Diabetes Mother   . Diabetes Father   . Stroke Father   . Hypertension Sister     x 2  . Stroke Maternal Aunt   . Colon cancer Maternal Grandfather    History  Substance Use Topics  . Smoking status: Never Smoker   . Smokeless tobacco: Never Used  . Alcohol Use: No   OB History    Gravida Para Term Preterm AB TAB SAB Ectopic  Multiple Living   0 0 0 0 0 0 0 0 0 0      Review of Systems  Constitutional: Negative for fever and chills.  Eyes: Positive for visual disturbance. Negative for photophobia and pain.  Respiratory: Positive for shortness of breath. Negative for cough.   Cardiovascular: Positive for chest pain. Negative for palpitations and leg swelling.  Gastrointestinal: Positive for nausea. Negative for vomiting, abdominal pain, diarrhea and constipation.  Endocrine: Positive for polydipsia and polyuria. Negative for polyphagia.  Neurological: Positive for weakness ( generalized) and light-headedness. Negative for dizziness and headaches.  All other systems reviewed and are negative.     Allergies  Ace inhibitors; Codeine; Peanut-containing drug products; Potassium iodide; Amlodipine; and Latex  Home Medications   Prior to Admission medications   Medication Sig Start Date End Date Taking? Authorizing Provider  ergocalciferol (VITAMIN D2) 50000 UNITS capsule Take 1 capsule (50,000 Units total) by mouth once a week. One capsule once weekly 10/23/13  Yes Fayrene Helper, MD  metFORMIN (GLUCOPHAGE) 1000 MG tablet Take 1 tablet (1,000 mg total) by mouth 2 (two) times daily with a meal. 04/20/14  Yes Fayrene Helper, MD  methotrexate (RHEUMATREX) 2.5 MG tablet Caution:Chemotherapy. Protect from light. Four tablets once  weekly 06/26/13  Yes Fayrene Helper, MD  mupirocin cream (BACTROBAN) 2 % Apply 1 application topically daily.   Yes Historical Provider, MD  nystatin (MYCOSTATIN) powder Apply topically 2 (two) times daily. Apply to affected area for up to 7 days 11/12/13  Yes Regina Eck, CNM  omeprazole (PRILOSEC) 20 MG capsule Take 20 mg by mouth daily.   Yes Historical Provider, MD  pravastatin (PRAVACHOL) 10 MG tablet Take 1 tablet (10 mg total) by mouth daily. 09/09/14 09/09/15 Yes Fayrene Helper, MD  predniSONE (DELTASONE) 5 MG tablet 5 mg. 2 tabs one. 1 tab the next (been on for 12 days)  09/15/14  Yes Historical Provider, MD  triamterene-hydrochlorothiazide (MAXZIDE-25) 37.5-25 MG per tablet Take 1 tablet by mouth daily. 05/13/14 05/13/15 Yes Fayrene Helper, MD  phentermine (ADIPEX-P) 37.5 MG tablet Take 1 tablet (37.5 mg total) by mouth daily before breakfast. 09/09/14   Fayrene Helper, MD  potassium chloride SA (K-DUR,KLOR-CON) 20 MEQ tablet Take 1 tablet (20 mEq total) by mouth 2 (two) times daily. Patient not taking: Reported on 10/11/2014 02/16/13   Fayrene Helper, MD   BP 112/72 mmHg  Pulse 93  Temp(Src) 98.3 F (36.8 C) (Oral)  Resp 22  Ht 5' (1.524 m)  Wt 242 lb (109.77 kg)  BMI 47.26 kg/m2  SpO2 97%  LMP 10/31/2002 Physical Exam  Constitutional: She appears well-developed and well-nourished. No distress.  Morbidly obese female lying in exam bed, NAD  HENT:  Head: Normocephalic and atraumatic.  Eyes: Conjunctivae are normal. No scleral icterus.  Neck: Normal range of motion. Neck supple.  Cardiovascular: Regular rhythm and normal heart sounds.  Tachycardia present.   Mild tachycardia, normal rhythm  Pulmonary/Chest: Effort normal and breath sounds normal. No respiratory distress. She has no wheezes. She has no rales. She exhibits no tenderness.  Abdominal: Soft. Bowel sounds are normal. She exhibits no distension and no mass. There is no tenderness. There is no rebound and no guarding.  Soft, non-tender.  Musculoskeletal: Normal range of motion.  Neurological: She is alert.  Skin: Skin is warm and dry. She is not diaphoretic.  Nursing note and vitals reviewed.   ED Course  Procedures (including critical care time) Labs Review Labs Reviewed  BASIC METABOLIC PANEL - Abnormal; Notable for the following:    Sodium 128 (*)    Potassium 3.4 (*)    Chloride 89 (*)    Glucose, Bld 556 (*)    BUN 26 (*)    Creatinine, Ser 1.50 (*)    Calcium 8.0 (*)    GFR calc non Af Amer 39 (*)    GFR calc Af Amer 45 (*)    All other components within normal  limits  URINALYSIS, ROUTINE W REFLEX MICROSCOPIC - Abnormal; Notable for the following:    Glucose, UA >1000 (*)    Hgb urine dipstick SMALL (*)    Ketones, ur 15 (*)    Protein, ur 30 (*)    All other components within normal limits  URINE MICROSCOPIC-ADD ON - Abnormal; Notable for the following:    Squamous Epithelial / LPF FEW (*)    Bacteria, UA FEW (*)    All other components within normal limits  CBG MONITORING, ED - Abnormal; Notable for the following:    Glucose-Capillary 558 (*)    All other components within normal limits  CBG MONITORING, ED - Abnormal; Notable for the following:    Glucose-Capillary 549 (*)    All  other components within normal limits  I-STAT CHEM 8, ED - Abnormal; Notable for the following:    Potassium 2.9 (*)    Chloride 95 (*)    BUN 27 (*)    Creatinine, Ser 1.20 (*)    Glucose, Bld 325 (*)    Calcium, Ion 1.00 (*)    All other components within normal limits  CBC WITH DIFFERENTIAL/PLATELET  Randolm Idol, ED    Imaging Review Dg Chest 2 View  10/11/2014   CLINICAL DATA:  Hyperglycemia, chest pain, shortness of breath  EXAM: CHEST  2 VIEW  COMPARISON:  05/18/2014  FINDINGS: Low lung volumes. No focal consolidation. No pleural effusion or pneumothorax.  The heart is normal in size.  Mild degenerative changes of the visualized thoracolumbar spine.  IMPRESSION: No evidence of acute cardiopulmonary disease.   Electronically Signed   By: Julian Hy M.D.   On: 10/11/2014 20:09     EKG Interpretation   Date/Time:  Monday October 11 2014 19:10:40 EST Ventricular Rate:  97 PR Interval:  115 QRS Duration: 89 QT Interval:  375 QTC Calculation: 476 R Axis:   52 Text Interpretation:  Sinus rhythm Ventricular premature complex  Borderline short PR interval Sinus rhythm Artifact Non-specific  intra-ventricular conduction delay No significant change since last  tracing Abnormal ekg Confirmed by Carmin Muskrat  MD [phone]) on 10/11/2014   7:17:20 PM      MDM   Final diagnoses:  Hyperglycemia  Hypokalemia  Nausea  Blurred vision  Polydipsia    Pt is a 54yo female with hx of DM and Lupus, presenting to ED by PCP for further evaluation of hyperglycemia. Pt has been taking prednisone for 23 days for an allergic reaction to a medication for her Lupus.  CBG-558 upon arrival. Pt c/o polyuria, polydipsia, blurred vision and generalized weakness.  She also reported mild SOB and CP.  Cardiac workup unremarkable with troponin of 0.0 and unremarkable EKG. Labs: hyperglycemia, anion gap of 10. Pt given 10 units of insulin as well a 2L IV NS. Glucose improved to 325.  Mild hypokalemia, pt given 75mEq of K-dur.   Pt hemodynamically stable for discharge home.  Discussed pt with Dr. Vanita Panda who also examined pt. Agrees pt stable for discharge, f/u with PCP in 1-2 days for lab recheck and to discuss her current medications. Return precautions provided. Pt verbalized understanding and agreement with tx plan.     Noland Fordyce, PA-C 10/12/14 0000  Carmin Muskrat, MD 10/12/14 [phone]

## 2014-10-13 ENCOUNTER — Emergency Department (HOSPITAL_COMMUNITY)
Admission: EM | Admit: 2014-10-13 | Discharge: 2014-10-14 | Disposition: A | Discharge status: Home / Self Care | Payer: BLUE CROSS/BLUE SHIELD | Attending: Emergency Medicine | Admitting: Emergency Medicine

## 2014-10-13 ENCOUNTER — Encounter (HOSPITAL_COMMUNITY): Payer: Self-pay | Admitting: Emergency Medicine

## 2014-10-13 DIAGNOSIS — Z91040 Latex allergy status: Secondary | ICD-10-CM | POA: Insufficient documentation

## 2014-10-13 DIAGNOSIS — R0602 Shortness of breath: Secondary | ICD-10-CM | POA: Insufficient documentation

## 2014-10-13 DIAGNOSIS — Z79899 Other long term (current) drug therapy: Secondary | ICD-10-CM | POA: Diagnosis not present

## 2014-10-13 DIAGNOSIS — Z7952 Long term (current) use of systemic steroids: Secondary | ICD-10-CM | POA: Insufficient documentation

## 2014-10-13 DIAGNOSIS — I10 Essential (primary) hypertension: Secondary | ICD-10-CM | POA: Diagnosis not present

## 2014-10-13 DIAGNOSIS — M1990 Unspecified osteoarthritis, unspecified site: Secondary | ICD-10-CM | POA: Insufficient documentation

## 2014-10-13 DIAGNOSIS — Z862 Personal history of diseases of the blood and blood-forming organs and certain disorders involving the immune mechanism: Secondary | ICD-10-CM | POA: Diagnosis not present

## 2014-10-13 DIAGNOSIS — Z87448 Personal history of other diseases of urinary system: Secondary | ICD-10-CM | POA: Diagnosis not present

## 2014-10-13 DIAGNOSIS — Z8669 Personal history of other diseases of the nervous system and sense organs: Secondary | ICD-10-CM | POA: Insufficient documentation

## 2014-10-13 DIAGNOSIS — E669 Obesity, unspecified: Secondary | ICD-10-CM | POA: Insufficient documentation

## 2014-10-13 DIAGNOSIS — E1165 Type 2 diabetes mellitus with hyperglycemia: Secondary | ICD-10-CM | POA: Diagnosis not present

## 2014-10-13 DIAGNOSIS — Z872 Personal history of diseases of the skin and subcutaneous tissue: Secondary | ICD-10-CM | POA: Diagnosis not present

## 2014-10-13 DIAGNOSIS — R739 Hyperglycemia, unspecified: Secondary | ICD-10-CM

## 2014-10-13 LAB — BASIC METABOLIC PANEL
ANION GAP: 11 (ref 5–15)
BUN: 28 mg/dL — ABNORMAL HIGH (ref 6–23)
CALCIUM: 7.4 mg/dL — AB (ref 8.4–10.5)
CO2: 26 mmol/L (ref 19–32)
Chloride: 96 mmol/L (ref 96–112)
Creatinine, Ser: 1.47 mg/dL — ABNORMAL HIGH (ref 0.50–1.10)
GFR, EST AFRICAN AMERICAN: 47 mL/min — AB (ref >90)
GFR, EST NON AFRICAN AMERICAN: 40 mL/min — AB (ref >90)
Glucose, Bld: 510 mg/dL — ABNORMAL HIGH (ref 70–99)
Potassium: 3.4 mmol/L — ABNORMAL LOW (ref 3.5–5.1)
SODIUM: 133 mmol/L — AB (ref 135–145)

## 2014-10-13 LAB — CBG MONITORING, ED: Glucose-Capillary: 486 mg/dL — ABNORMAL HIGH (ref 70–99)

## 2014-10-13 MED ORDER — SODIUM CHLORIDE 0.9 % IV SOLN
1000.0000 mL | INTRAVENOUS | Status: DC
  Administered 2014-10-14: 1000 mL via INTRAVENOUS

## 2014-10-13 MED ORDER — SODIUM CHLORIDE 0.9 % IV SOLN
1000.0000 mL | Freq: Once | INTRAVENOUS | Status: AC
  Administered 2014-10-13: 1000 mL via INTRAVENOUS

## 2014-10-13 MED ORDER — SODIUM CHLORIDE 0.9 % IV SOLN
Freq: Once | INTRAVENOUS | Status: AC
  Administered 2014-10-13: 23:00:00 via INTRAVENOUS

## 2014-10-13 MED ORDER — SODIUM CHLORIDE 0.9 % IV SOLN
INTRAVENOUS | Status: DC
  Administered 2014-10-14: 3.2 [IU]/h via INTRAVENOUS
  Filled 2014-10-13: qty 2.5

## 2014-10-13 MED ORDER — SODIUM CHLORIDE 0.9 % IV SOLN
1000.0000 mL | Freq: Once | INTRAVENOUS | Status: AC
  Administered 2014-10-14: 1000 mL via INTRAVENOUS

## 2014-10-13 NOTE — ED Notes (Signed)
Pt has finished 23 days of prednisone for allergic reaction. Pt face is flushed, states reaction has not went away. Pt having trouble controlling Blood sugar.  BS 504 at home.

## 2014-10-13 NOTE — ED Provider Notes (Signed)
CSN: [id number]     Arrival date & time 10/13/14  2211 History  This chart was scribed for Janice Norrie, MD by Delphia Grates, ED Scribe. This patient was seen in room APA09/APA09 and the patient's care was started at 11:26 PM.   Chief Complaint  Patient presents with  . Hyperglycemia    The history is provided by the patient. No language interpreter was used.     HPI Comments: Sabrina [name] is a 54 y.o. female, with history of thyroid disease, HTN, and DM, who presents to the Emergency Department for evaluation of hyperglycemia. Patient was seen at [facility] for the same on February 29th. She reports her blood glucose was 558 during that visit and was discharged with CBG in the 300's. She notes her sugars typically range in the 117-120. She was seen by her PCP today and her CBG was 336. At home tonight, her sugar was 506. She notes she excessive thirst and reports voiding every 30 minutes including at night. Patient states she was diagnosed with DM approximately 3 years ago and  is currently on Metformin 1000mg  twice daily which had been controlling her diabetes well until she was placed on prednisone. Patient states she was on Prednisone for 23 days and finished 2 day ago for an allergic reaction caused by SSKI drops she had been taking for 1 week for Lupus. Patient mentions SOB for the past 2 weeks. She also notes nausea and vomiting 2 days ago, but none today. In addition, she reports diarrhea 3-4 days ago, but none today. She denies fever, chills, abdominal pain or chest pain. She was diagnosed with cutaneous lupus about 2 years ago.  Patient works for [employer], is a nonsmoker, and denies history of EtOH consumption.  PCP: [name]  Dr. Sharol Roussel is managing her Lupus  Past Medical History  Diagnosis Date  . Thyroid disease   . Hypertension   . Allergy   . Diabetes mellitus   . Vitamin D deficiency   . Obesity   . Secondary hyperthyroidism   . Hypersomnia  with sleep apnea   . Arthritis   . Chronic bronchitis   . Fibroid uterus   . Menorrhagia   . Hx of iron deficiency anemia   . Endometriosis   . Lupus 07/2013    Of Skin   Past Surgical History  Procedure Laterality Date  . Abdominal hysterectomy  2004    supracervical hysterectomy  . Breast surgery  2005    reduction  . Dilation and curettage of uterus      age 65   Family History  Problem Relation Age of Onset  . Hypertension Mother   . Diabetes Mother   . Diabetes Father   . Stroke Father   . Hypertension Sister     x 2  . Stroke Maternal Aunt   . Colon cancer Maternal Grandfather    History  Substance Use Topics  . Smoking status: Never Smoker   . Smokeless tobacco: Never Used  . Alcohol Use: No   employed Lives with spouse  OB History    Gravida Para Term Preterm AB TAB SAB Ectopic Multiple Living   0 0 0 0 0 0 0 0 0 0      Review of Systems  Respiratory: Positive for shortness of breath.   Gastrointestinal: Positive for nausea (resolved), vomiting (resolved) and diarrhea (resolved).  Genitourinary: Positive for frequency.  All other systems reviewed and are negative.  Allergies  Ace inhibitors; Codeine; Peanut-containing drug products; Potassium iodide; Amlodipine; and Latex  Home Medications   Prior to Admission medications   Medication Sig Start Date End Date Taking? Authorizing Provider  ergocalciferol (VITAMIN D2) 50000 UNITS capsule Take 1 capsule (50,000 Units total) by mouth once a week. One capsule once weekly 10/23/13   Fayrene Helper, MD  metFORMIN (GLUCOPHAGE) 1000 MG tablet Take 1 tablet (1,000 mg total) by mouth 2 (two) times daily with a meal. 04/20/14   Fayrene Helper, MD  methotrexate (RHEUMATREX) 2.5 MG tablet Caution:Chemotherapy. Protect from light. Four tablets once weekly 06/26/13   Fayrene Helper, MD  mupirocin cream (BACTROBAN) 2 % Apply 1 application topically daily.    Historical Provider, MD  nystatin  (MYCOSTATIN) powder Apply topically 2 (two) times daily. Apply to affected area for up to 7 days 11/12/13   Regina Eck, CNM  omeprazole (PRILOSEC) 20 MG capsule Take 20 mg by mouth daily.    Historical Provider, MD  phentermine (ADIPEX-P) 37.5 MG tablet Take 1 tablet (37.5 mg total) by mouth daily before breakfast. 09/09/14   Fayrene Helper, MD  potassium chloride SA (K-DUR,KLOR-CON) 20 MEQ tablet Take 1 tablet (20 mEq total) by mouth 2 (two) times daily. Patient not taking: Reported on 10/11/2014 02/16/13   Fayrene Helper, MD  pravastatin (PRAVACHOL) 10 MG tablet Take 1 tablet (10 mg total) by mouth daily. 09/09/14 09/09/15  Fayrene Helper, MD  predniSONE (DELTASONE) 5 MG tablet 5 mg. 2 tabs one. 1 tab the next (been on for 12 days) 09/15/14   Historical Provider, MD  triamterene-hydrochlorothiazide (MAXZIDE-25) 37.5-25 MG per tablet Take 1 tablet by mouth daily. 05/13/14 05/13/15  Fayrene Helper, MD   Triage Vitals: BP 118/68 mmHg  Pulse 96  Temp(Src) 98.1 F (36.7 C) (Oral)  Resp 24  Ht 5' (1.524 m)  Wt 243 lb (110.224 kg)  BMI 47.46 kg/m2  SpO2 100%  LMP 10/31/2002  Vital signs normal    Physical Exam  Constitutional: She is oriented to person, place, and time. She appears well-developed and well-nourished.  Non-toxic appearance. She does not appear ill. No distress.  HENT:  Head: Normocephalic and atraumatic.  Right Ear: External ear normal.  Left Ear: External ear normal.  Nose: Nose normal. No mucosal edema or rhinorrhea.  Mouth/Throat: Mucous membranes are normal. No dental abscesses or uvula swelling.  Tongue dry  Eyes: Conjunctivae and EOM are normal. Pupils are equal, round, and reactive to light.  Neck: Normal range of motion and full passive range of motion without pain. Neck supple.  Cardiovascular: Normal rate, regular rhythm and normal heart sounds.  Exam reveals no gallop and no friction rub.   No murmur heard. Pulmonary/Chest: Effort normal and breath  sounds normal. No respiratory distress. She has no wheezes. She has no rhonchi. She has no rales. She exhibits no tenderness and no crepitus.  Abdominal: Soft. Normal appearance and bowel sounds are normal. She exhibits no distension. There is no tenderness. There is no rebound and no guarding.  Musculoskeletal: Normal range of motion. She exhibits no edema or tenderness.  Moves all extremities well.   Neurological: She is alert and oriented to person, place, and time. She has normal strength. No cranial nerve deficit.  Skin: Skin is warm, dry and intact. No rash noted. No pallor.  Hyperigmented interspersed with hypopigmented areas on back.  Scattered Hyperpigmented areas on face. Mild diffuse redness of the face.  Psychiatric: She  has a normal mood and affect. Her speech is normal and behavior is normal. Her mood appears not anxious.  Nursing note and vitals reviewed.   ED Course  Procedures (including critical care time)  Medications  0.9 %  sodium chloride infusion (0 mLs Intravenous Stopped 10/14/14 0210)    Followed by  0.9 %  sodium chloride infusion (0 mLs Intravenous Stopped 10/14/14 0245)    Followed by  0.9 %  sodium chloride infusion (1,000 mLs Intravenous New Bag/Given 10/14/14 0245)  insulin regular (NOVOLIN R,HUMULIN R) 250 Units in sodium chloride 0.9 % 250 mL (1 Units/mL) infusion (3.5 Units/hr Intravenous Rate/Dose Change 10/14/14 0340)  0.9 %  sodium chloride infusion ( Intravenous Stopped 10/14/14 0100)     DIAGNOSTIC STUDIES: Oxygen Saturation is 100% on room air, normal by my interpretation.    COORDINATION OF CARE: At 2337 Discussed treatment plan with patient which includes labs, IV fluids and insulin drip. Patient agrees.   At [place] patient of negative lab results for DKA. Informed patient to be strict with diet until Prednisone is completely out of system. We also discussed she might need to be on a second diabetic agent.   3 AM patient states she's feeling  better. Her blood sugars are slowly improving. I am going to discharge her once her blood sugars are in the low 200s. We discussed adding a possible second agent and I did give her prescription for glipizide if her blood sugars start creeping up again once she is discharged. I am also giving her some dietary information because she needs to be very careful for diet over the next several days.  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 She had been placed on SSKI for a rash on her face felt to be from the scleroderma. She had a intolerance to that and then was placed on prednisone.  Results for orders placed or performed during the hospital encounter of 23/76/28  Basic metabolic panel  Result Value Ref Range   Sodium 133 (L) 135 - 145 mmol/L   Potassium 3.4 (L) 3.5 - 5.1 mmol/L   Chloride 96 96 - 112 mmol/L   CO2 26 19 - 32 mmol/L   Glucose, Bld 510 (H) 70 - 99 mg/dL   BUN 28 (H) 6 - 23 mg/dL   Creatinine, Ser 1.47 (H) 0.50 - 1.10 mg/dL   Calcium 7.4 (L) 8.4 - 10.5 mg/dL   GFR calc non Af Amer 40 (L) >90 mL/min   GFR calc Af Amer 47 (L) >90 mL/min   Anion gap 11 5 - 15  Urinalysis, Routine w reflex microscopic  Result Value Ref Range   Color, Urine YELLOW YELLOW   APPearance CLEAR CLEAR   Specific Gravity, Urine 1.010 1.005 - 1.030   pH 6.0 5.0 - 8.0   Glucose, UA >1000 (A) NEGATIVE mg/dL   Hgb urine dipstick NEGATIVE NEGATIVE   Bilirubin Urine NEGATIVE NEGATIVE   Ketones, ur NEGATIVE NEGATIVE mg/dL   Protein, ur NEGATIVE NEGATIVE mg/dL   Urobilinogen, UA 0.2 0.0 - 1.0 mg/dL   Nitrite NEGATIVE NEGATIVE   Leukocytes, UA NEGATIVE NEGATIVE  Urine microscopic-add on  Result Value Ref Range   Squamous Epithelial / LPF MANY (A) RARE   WBC, UA 3-6 <3 WBC/hpf   RBC / HPF 0-2 <3 RBC/hpf   Bacteria, UA FEW (A) RARE  CBG monitoring, ED  Result Value Ref Range   Glucose-Capillary 486 (H)  70 - 99 mg/dL   Comment 1  Notify RN   CBG monitoring, ED  Result Value Ref Range   Glucose-Capillary 400 (H) 70 - 99 mg/dL   Comment 1 Notify RN   CBG monitoring, ED  Result Value Ref Range   Glucose-Capillary 384 (H) 70 - 99 mg/dL  CBG monitoring, ED  Result Value Ref Range   Glucose-Capillary 372 (H) 70 - 99 mg/dL  CBG monitoring, ED  Result Value Ref Range   Glucose-Capillary 236 (H) 70 - 99 mg/dL    Laboratory interpretation all normal except hyperglycemia, stable renal insufficiency, glucosuria without ketosis, and improving hyperglycemia on insulin drip    Imaging Review Dg Chest 2 View  10/11/2014   CLINICAL DATA:  Hyperglycemia, chest pain, shortness of breath  .  IMPRESSION: No evidence of acute cardiopulmonary disease.   Electronically Signed   By: Julian Hy M.D.   On: 10/11/2014 20:09      EKG Interpretation None      MDM   Final diagnoses:  Hyperglycemia    New Prescriptions   GLIPIZIDE (GLUCOTROL) 5 MG TABLET    Take 1 tablet (5 mg total) by mouth daily before breakfast.    Plan discharge   Rolland Porter, MD, FACEP   I personally performed the services described in this documentation, which was scribed in my presence. The recorded information has been reviewed and considered.  Rolland Porter, MD, FACEP    Janice Norrie, MD 10/14/14 [phone]

## 2014-10-13 NOTE — ED Notes (Signed)
Reminded patient urine specimen is needed.

## 2014-10-13 NOTE — ED Notes (Signed)
Pt states she takes Metformin 1000mg  BID, has been on a 20 day prednisone treatment, had similar episode with glucose a few days ago and was seen at [facility].

## 2014-10-13 NOTE — ED Notes (Signed)
Blood sugar 486 in triage

## 2014-10-14 LAB — URINALYSIS, ROUTINE W REFLEX MICROSCOPIC
Bilirubin Urine: NEGATIVE
HGB URINE DIPSTICK: NEGATIVE
Ketones, ur: NEGATIVE mg/dL
Leukocytes, UA: NEGATIVE
Nitrite: NEGATIVE
PH: 6 (ref 5.0–8.0)
Protein, ur: NEGATIVE mg/dL
SPECIFIC GRAVITY, URINE: 1.01 (ref 1.005–1.030)
Urobilinogen, UA: 0.2 mg/dL (ref 0.0–1.0)

## 2014-10-14 LAB — URINE MICROSCOPIC-ADD ON

## 2014-10-14 LAB — CBG MONITORING, ED
GLUCOSE-CAPILLARY: 236 mg/dL — AB (ref 70–99)
Glucose-Capillary: 372 mg/dL — ABNORMAL HIGH (ref 70–99)
Glucose-Capillary: 384 mg/dL — ABNORMAL HIGH (ref 70–99)
Glucose-Capillary: 400 mg/dL — ABNORMAL HIGH (ref 70–99)

## 2014-10-14 MED ORDER — GLIPIZIDE 5 MG PO TABS: 5.0000 mg | ORAL_TABLET | Freq: Every day | ORAL | Status: DC

## 2014-10-14 NOTE — ED Notes (Signed)
Patient verbalizes understanding of discharge instructions, prescription medications and follow up care. Patient out of department at this time with family

## 2014-10-14 NOTE — Discharge Instructions (Signed)
Continue to monitor your blood sugars closely. Look at the diabetic diet sheet and try to be strict about your diet for the next couple of days. If your blood sugar stars increasing again, Dr Moshe Cipro may want you to start the glipizide to help control your blood sugars right now.

## 2014-10-19 ENCOUNTER — Ambulatory Visit (INDEPENDENT_AMBULATORY_CARE_PROVIDER_SITE_OTHER): Payer: BLUE CROSS/BLUE SHIELD | Admitting: Family Medicine

## 2014-10-19 VITALS — BP 140/62 | HR 100 | Resp 18 | Ht 61.0 in | Wt 248.0 lb

## 2014-10-19 DIAGNOSIS — I10 Essential (primary) hypertension: Secondary | ICD-10-CM | POA: Diagnosis not present

## 2014-10-19 DIAGNOSIS — Z6841 Body Mass Index (BMI) 40.0 and over, adult: Secondary | ICD-10-CM

## 2014-10-19 DIAGNOSIS — E1129 Type 2 diabetes mellitus with other diabetic kidney complication: Secondary | ICD-10-CM | POA: Diagnosis not present

## 2014-10-19 DIAGNOSIS — E1321 Other specified diabetes mellitus with diabetic nephropathy: Secondary | ICD-10-CM | POA: Diagnosis not present

## 2014-10-19 DIAGNOSIS — E785 Hyperlipidemia, unspecified: Secondary | ICD-10-CM

## 2014-10-19 MED ORDER — CANAGLIFLOZIN 100 MG PO TABS: 100.0000 mg | ORAL_TABLET | Freq: Every day | ORAL | Status: DC

## 2014-10-19 MED ORDER — CANAGLIFLOZIN 300 MG PO TABS: 300.0000 mg | ORAL_TABLET | Freq: Every day | ORAL | Status: DC

## 2014-10-19 NOTE — Patient Instructions (Addendum)
F/u April 28 or after, HBa1C chem 7 and EGFr 4/26 or after  Blood sugar high due to prednisone, it will go down bUT you are now diabetic, need to test once daily , record, go to [facility]  Carb count and follow diet    Goal for fasting blood sugar ranges from 80 to 120 and 2 hours after any meal or at bedtime should be between 130 to 170.  I anticipate that you will be able to stop the glipizide once you get on the 300 mg dose of invokana or before, IT IS ESSENTIAL to test twice daily and call every Friday for the next  4 weeks

## 2014-10-29 ENCOUNTER — Telehealth: Payer: Self-pay

## 2014-10-29 NOTE — Telephone Encounter (Signed)
Fasting- 120,129,135,131,135,129,126,130,120,121 Bedtime- 125,128,125,130,120,130,121,130,129 (Told her numbers look good and to try to eat a carb snack when bedtime sugars are below 130) Any further suggestions?

## 2014-10-29 NOTE — Telephone Encounter (Signed)
Message left for patient

## 2014-10-29 NOTE — Telephone Encounter (Signed)
Nothing to add great advise, stay on same med dose ,pls ask her to call in next Thursday

## 2014-10-31 NOTE — Assessment & Plan Note (Signed)
Deteriorated. Patient re-educated about  the importance of commitment to a  minimum of 150 minutes of exercise per week.  The importance of healthy food choices with portion control discussed. Encouraged to start a food diary, count calories and to consider  joining a support group. Sample diet sheets offered. Goals set by the patient for the next several months.   Wt Readings from Last 3 Encounters:  10/19/14 248 lb (112.492 kg)  10/13/14 243 lb (110.224 kg)  10/11/14 242 lb (109.77 kg)    Body mass index is 46.88 kg/(m^2).  Current exercise per week 30 minutes.

## 2014-10-31 NOTE — Assessment & Plan Note (Signed)
Elevated, not at gol, recent high dose steroid may be responsble, no mmed change DASH diet and commitment to daily physical activity for a minimum of 30 minutes discussed and encouraged, as a part of hypertension management. The importance of attaining a healthy weight is also discussed.  BP/Weight 10/19/2014 10/14/2014 10/13/2014 10/11/2014 10/11/2014 09/09/2014 7/35/6701  Systolic BP 410 301 - 314 388 875 797  Diastolic BP 62 83 - 72 90 78 74  Wt. (Lbs) 248 - 243 242 242 254.8 245.4  BMI 46.88 - 47.46 47.26 45.75 48.17 46.39    CMP Latest Ref Rng 10/13/2014 10/11/2014 10/11/2014  Glucose 70 - 99 mg/dL 510(H) 325(H) 556(HH)  BUN 6 - 23 mg/dL 28(H) 27(H) 26(H)  Creatinine 0.50 - 1.10 mg/dL 1.47(H) 1.20(H) 1.50(H)  Sodium 135 - 145 mmol/L 133(L) 137 128(L)  Potassium 3.5 - 5.1 mmol/L 3.4(L) 2.9(L) 3.4(L)  Chloride 96 - 112 mmol/L 96 95(L) 89(L)  CO2 19 - 32 mmol/L 26 - 29  Calcium 8.4 - 10.5 mg/dL 7.4(L) - 8.0(L)  Total Protein 6.0 - 8.3 g/dL - - -  Total Bilirubin 0.2 - 1.2 mg/dL - - -  Alkaline Phos 39 - 117 U/L - - -  AST 0 - 37 U/L - - -  ALT 0 - 35 U/L - - -

## 2014-10-31 NOTE — Assessment & Plan Note (Signed)
Uncontrolled due to recent high dose steroids for skin condition Additional medications started, referral to nutritionist, twice daily testing until blood sugars normalize with weekly calls to the office Pt to start carb counting and exercise 30 mins daily, when she  Starts feeling better

## 2014-11-07 ENCOUNTER — Other Ambulatory Visit: Payer: Self-pay | Admitting: Family Medicine

## 2014-11-07 ENCOUNTER — Telehealth: Payer: Self-pay | Admitting: Family Medicine

## 2014-11-07 ENCOUNTER — Encounter: Payer: Self-pay | Admitting: Family Medicine

## 2014-11-07 MED ORDER — POTASSIUM CHLORIDE CRYS ER 20 MEQ PO TBCR
20.0000 meq | EXTENDED_RELEASE_TABLET | Freq: Two times a day (BID) | ORAL | Status: DC
Start: 2014-11-07 — End: 2015-08-24

## 2014-11-07 MED ORDER — CANAGLIFLOZIN 300 MG PO TABS: 300.0000 mg | ORAL_TABLET | Freq: Every day | ORAL | Status: DC

## 2014-11-07 NOTE — Progress Notes (Signed)
Subjective:    Patient ID: Sabrina [name], female    DOB: 27-Jan-1961, 54 y.o.   MRN: [id number]  HPI Pt called in due C/o polyuria, polydipsia and dry mouth, blood sugars are still over 250 here for f/u of  recent Ed visit for marked hyperglycemia, following recent course of high dose steroids for her skin condition. She had classic symptoms of hyperglycemia, and was actually seeing  Her endo who follows her thyroid/parathyroid condition, when her accu check was over 500 and she was sent to the Ed   Review of Systems See HPI Denies recent fever or chills.c/o fatigue Denies sinus pressure, nasal congestion, ear pain or sore throat. Denies chest congestion, productive cough or wheezing. Denies chest pains, palpitations and leg swelling Denies abdominal pain, nausea, vomiting,diarrhea or constipation.   . Denies joint pain, swelling and limitation in mobility. Denies headaches, seizures, numbness, or tingling. Denies depression, anxiety or insomnia.       Objective:   Physical Exam  BP 140/62 mmHg  Pulse 100  Resp 18  Ht 5\' 1"  (1.549 m)  Wt 248 lb (112.492 kg)  BMI 46.88 kg/m2  SpO2 95%  LMP 10/31/2002 Patient alert and oriented and in no cardiopulmonary distress.  HEENT: No facial asymmetry, EOMI,   oropharynx pink and moist.  Neck supple no JVD, no mass.  Chest: Clear to auscultation bilaterally.  CVS: S1, S2 no murmurs, no S3.Regular rate.  ABD: Soft non tender.   Ext: No edema  MS: Adequate ROM spine, shoulders, hips and knees.  Skin: Intact, rash noted.  Psych: Good eye contact, normal affect. Memory intact not anxious or depressed appearing.  CNS: CN 2-12 intact, power,  normal throughout.no focal deficits noted.       Assessment & Plan:  Diabetes mellitus with renal manifestation Uncontrolled due to recent high dose steroids for skin condition Additional medications started, referral to nutritionist, twice daily testing until blood sugars  normalize with weekly calls to the office Pt to start carb counting and exercise 30 mins daily, when she  Starts feeling better   Essential hypertension Elevated, not at gol, recent high dose steroid may be responsble, no mmed change DASH diet and commitment to daily physical activity for a minimum of 30 minutes discussed and encouraged, as a part of hypertension management. The importance of attaining a healthy weight is also discussed.  BP/Weight 10/19/2014 10/14/2014 10/13/2014 10/11/2014 10/11/2014 09/09/2014 4/00/8676  Systolic BP 195 093 - 267 124 580 998  Diastolic BP 62 83 - 72 90 78 74  Wt. (Lbs) 248 - 243 242 242 254.8 245.4  BMI 46.88 - 47.46 47.26 45.75 48.17 46.39    CMP Latest Ref Rng 10/13/2014 10/11/2014 10/11/2014  Glucose 70 - 99 mg/dL 510(H) 325(H) 556(HH)  BUN 6 - 23 mg/dL 28(H) 27(H) 26(H)  Creatinine 0.50 - 1.10 mg/dL 1.47(H) 1.20(H) 1.50(H)  Sodium 135 - 145 mmol/L 133(L) 137 128(L)  Potassium 3.5 - 5.1 mmol/L 3.4(L) 2.9(L) 3.4(L)  Chloride 96 - 112 mmol/L 96 95(L) 89(L)  CO2 19 - 32 mmol/L 26 - 29  Calcium 8.4 - 10.5 mg/dL 7.4(L) - 8.0(L)  Total Protein 6.0 - 8.3 g/dL - - -  Total Bilirubin 0.2 - 1.2 mg/dL - - -  Alkaline Phos 39 - 117 U/L - - -  AST 0 - 37 U/L - - -  ALT 0 - 35 U/L - - -        Morbid obesity with BMI of 45.0-49.9, adult  Deteriorated. Patient re-educated about  the importance of commitment to a  minimum of 150 minutes of exercise per week.  The importance of healthy food choices with portion control discussed. Encouraged to start a food diary, count calories and to consider  joining a support group. Sample diet sheets offered. Goals set by the patient for the next several months.   Wt Readings from Last 3 Encounters:  10/19/14 248 lb (112.492 kg)  10/13/14 243 lb (110.224 kg)  10/11/14 242 lb (109.77 kg)    Body mass index is 46.88 kg/(m^2).  Current exercise per week 30 minutes.    Hyperlipidemia LDL goal <100 Controlled, no  change in medication Hyperlipidemia:Low fat diet discussed and encouraged.  CMP Latest Ref Rng 10/13/2014 10/11/2014 10/11/2014  Glucose 70 - 99 mg/dL 510(H) 325(H) 556(HH)  BUN 6 - 23 mg/dL 28(H) 27(H) 26(H)  Creatinine 0.50 - 1.10 mg/dL 1.47(H) 1.20(H) 1.50(H)  Sodium 135 - 145 mmol/L 133(L) 137 128(L)  Potassium 3.5 - 5.1 mmol/L 3.4(L) 2.9(L) 3.4(L)  Chloride 96 - 112 mmol/L 96 95(L) 89(L)  CO2 19 - 32 mmol/L 26 - 29  Calcium 8.4 - 10.5 mg/dL 7.4(L) - 8.0(L)  Total Protein 6.0 - 8.3 g/dL - - -  Total Bilirubin 0.2 - 1.2 mg/dL - - -  Alkaline Phos 39 - 117 U/L - - -  AST 0 - 37 U/L - - -  ALT 0 - 35 U/L - - -    Lipid Panel     Component Value Date/Time   CHOL 146 05/07/2014 1127   TRIG 141 05/07/2014 1127   HDL 46 05/07/2014 1127   CHOLHDL 3.2 05/07/2014 1127   VLDL 28 05/07/2014 1127   LDLCALC 72 05/07/2014 1127

## 2014-11-07 NOTE — Telephone Encounter (Signed)
I called pt today, her fasting sugars are in the 140's and bedtime also in the 140's I have advised and sent in the 300 mg dose of invokana, which she is to start . Expect a call this Friday with her numbers, states she forgot to call in on Thursday

## 2014-11-07 NOTE — Assessment & Plan Note (Signed)
Controlled, no change in medication Hyperlipidemia:Low fat diet discussed and encouraged.  CMP Latest Ref Rng 10/13/2014 10/11/2014 10/11/2014  Glucose 70 - 99 mg/dL 510(H) 325(H) 556(HH)  BUN 6 - 23 mg/dL 28(H) 27(H) 26(H)  Creatinine 0.50 - 1.10 mg/dL 1.47(H) 1.20(H) 1.50(H)  Sodium 135 - 145 mmol/L 133(L) 137 128(L)  Potassium 3.5 - 5.1 mmol/L 3.4(L) 2.9(L) 3.4(L)  Chloride 96 - 112 mmol/L 96 95(L) 89(L)  CO2 19 - 32 mmol/L 26 - 29  Calcium 8.4 - 10.5 mg/dL 7.4(L) - 8.0(L)  Total Protein 6.0 - 8.3 g/dL - - -  Total Bilirubin 0.2 - 1.2 mg/dL - - -  Alkaline Phos 39 - 117 U/L - - -  AST 0 - 37 U/L - - -  ALT 0 - 35 U/L - - -    Lipid Panel     Component Value Date/Time   CHOL 146 05/07/2014 1127   TRIG 141 05/07/2014 1127   HDL 46 05/07/2014 1127   CHOLHDL 3.2 05/07/2014 1127   VLDL 28 05/07/2014 1127   LDLCALC 72 05/07/2014 1127

## 2014-11-09 NOTE — Telephone Encounter (Signed)
Will contact her Friday of she doesn't call in the am

## 2014-11-12 ENCOUNTER — Telehealth: Payer: Self-pay

## 2014-11-12 NOTE — Telephone Encounter (Signed)
Fasting- 121,125,117,120,115 Bedtime [phone].  Advised her numbers look great and to keep up the great work and I would talk to her next week.

## 2014-11-14 DIAGNOSIS — J209 Acute bronchitis, unspecified: Secondary | ICD-10-CM | POA: Insufficient documentation

## 2014-11-14 DIAGNOSIS — M25551 Pain in right hip: Secondary | ICD-10-CM | POA: Insufficient documentation

## 2014-11-14 DIAGNOSIS — Z23 Encounter for immunization: Secondary | ICD-10-CM | POA: Insufficient documentation

## 2014-11-14 NOTE — Assessment & Plan Note (Signed)
unchnaged Patient re-educated about  the importance of commitment to a  minimum of 150 minutes of exercise per week.  The importance of healthy food choices with portion control discussed. Encouraged to start a food diary, count calories and to consider  joining a support group. Sample diet sheets offered. Goals set by the patient for the next several months.   Wt Readings from Last 3 Encounters:  10/19/14 248 lb (112.492 kg)  10/13/14 243 lb (110.224 kg)  10/11/14 242 lb (109.77 kg)    Body mass index is 46.39 kg/(m^2).  Current exercise per week 40 minutes.

## 2014-11-14 NOTE — Progress Notes (Signed)
Subjective:    Patient ID: Sabrina [name], female    DOB: 12-18-1960, 54 y.o.   MRN: [id number]  HPI The PT is here for follow up and re-evaluation of chronic medical conditions, medication management and review of any available recent lab and radiology data.  Preventive health is updated, specifically  Cancer screening and Immunization.   Questions or concerns regarding consultations or procedures which the PT has had in the interim are  Addressed.Still unclear/ uncertain as to exact skin diagnosis, followed at [facility] The PT denies any adverse reactions to current medications since the last visit.  1 week h/o cough, mainly non productive but sometimes yrellow sputum, intermittent chills, no documented fever No consistency in lifestyle with weight unchanged, interested in bariatric surgery,  Intermittent right hip pain, best response is to ibuprofen which she will use with caution , following muy update   Review of Systems See HPI Denies recent fever or chills. Denies sinus pressure, nasal congestion, ear pain or sore throat.  Denies chest pains, palpitations and leg swelling Denies abdominal pain, nausea, vomiting,diarrhea or constipation.   Denies dysuria, frequency, hesitancy or incontinence. Denies headaches, seizures, numbness, or tingling. Denies depression, anxiety or insomnia.       Objective:   Physical Exam BP 118/74 mmHg  Pulse 99  Resp 16  Ht 5\' 1"  (1.549 m)  Wt 245 lb 6.4 oz (111.313 kg)  BMI 46.39 kg/m2  SpO2 96%  LMP 10/31/2002 Patient alert and oriented and in no cardiopulmonary distress.  HEENT: No facial asymmetry, EOMI,   oropharynx pink and moist.  Neck supple no JVD, no mass.  Chest: Adequate arr entry, scattered crackles no wheeze  CVS: S1, S2 no murmurs, no S3.Regular rate.  ABD: Soft non tender.   Ext: No edema  MS: Adequate though reduced  ROM spine, shoulders, hips and knees.  Skin: hypopigmented macular rash present.  Psych:  Good eye contact, normal affect. Memory intact not anxious or depressed appearing.  CNS: CN 2-12 intact, power,  normal throughout.no focal deficits noted.        Assessment & Plan:  Acute bronchitis Antibiotics and decongestants prescribed medication also administered at office visit.     Diabetes mellitus with renal manifestation Controlled, no change in medication Patient educated about the importance of limiting  Carbohydrate intake , the need to commit to daily physical activity for a minimum of 30 minutes , and to commit weight loss. The fact that changes in all these areas will reduce or eliminate all together the development of diabetes is stressed.   Diabetic Labs Latest Ref Rng 10/13/2014 10/11/2014 10/11/2014 09/09/2014 09/07/2014  HbA1c <5.7 % - - - - 6.5(H)  Microalbumin <2.0 mg/dL - - - 2.1(H) -  Micro/Creat Ratio 0.0 - 30.0 mg/g - - - 19.1 -  Chol 0 - 200 mg/dL - - - - -  HDL >39 mg/dL - - - - -  Calc LDL 0 - 99 mg/dL - - - - -  Triglycerides <150 mg/dL - - - - -  Creatinine 0.50 - 1.10 mg/dL 1.47(H) 1.20(H) 1.50(H) - 1.08   BP/Weight 10/19/2014 10/14/2014 10/13/2014 10/11/2014 10/11/2014 09/09/2014 12/07/8339  Systolic BP 962 229 - 798 921 194 174  Diastolic BP 62 83 - 72 90 78 74  Wt. (Lbs) 248 - 243 242 242 254.8 245.4  BMI 46.88 - 47.46 47.26 45.75 48.17 46.39   Foot/eye exam completion dates Latest Ref Rng 09/15/2014 09/09/2014  Eye Exam No Retinopathy No  Retinopathy -  Foot exam Order - - -  Foot Form Completion - - Done        Morbid obesity with BMI of 45.0-49.9, adult unchnaged Patient re-educated about  the importance of commitment to a  minimum of 150 minutes of exercise per week.  The importance of healthy food choices with portion control discussed. Encouraged to start a food diary, count calories and to consider  joining a support group. Sample diet sheets offered. Goals set by the patient for the next several months.   Wt Readings from Last 3  Encounters:  10/19/14 248 lb (112.492 kg)  10/13/14 243 lb (110.224 kg)  10/11/14 242 lb (109.77 kg)    Body mass index is 46.39 kg/(m^2).  Current exercise per week 40 minutes.    Essential hypertension Controlled, no change in medication DASH diet and commitment to daily physical activity for a minimum of 30 minutes discussed and encouraged, as a part of hypertension management. The importance of attaining a healthy weight is also discussed.  BP/Weight 10/19/2014 10/14/2014 10/13/2014 10/11/2014 10/11/2014 09/09/2014 10/25/3886  Systolic BP 757 972 - 820 601 561 537  Diastolic BP 62 83 - 72 90 78 74  Wt. (Lbs) 248 - 243 242 242 254.8 245.4  BMI 46.88 - 47.46 47.26 45.75 48.17 46.39    CMP Latest Ref Rng 10/13/2014 10/11/2014 10/11/2014  Glucose 70 - 99 mg/dL 510(H) 325(H) 556(HH)  BUN 6 - 23 mg/dL 28(H) 27(H) 26(H)  Creatinine 0.50 - 1.10 mg/dL 1.47(H) 1.20(H) 1.50(H)  Sodium 135 - 145 mmol/L 133(L) 137 128(L)  Potassium 3.5 - 5.1 mmol/L 3.4(L) 2.9(L) 3.4(L)  Chloride 96 - 112 mmol/L 96 95(L) 89(L)  CO2 19 - 32 mmol/L 26 - 29  Calcium 8.4 - 10.5 mg/dL 7.4(L) - 8.0(L)  Total Protein 6.0 - 8.3 g/dL - - -  Total Bilirubin 0.2 - 1.2 mg/dL - - -  Alkaline Phos 39 - 117 U/L - - -  AST 0 - 37 U/L - - -  ALT 0 - 35 U/L - - -        Need for prophylactic vaccination and inoculation against influenza After obtaining informed consent, the vaccine is  administered by [name].    Right hip pain Intermittent flares, judicious use of ibuprofen discussed as association with jeart and kidney disease is now  A warning, she is aware Weight loss encouraged

## 2014-11-14 NOTE — Assessment & Plan Note (Signed)
After obtaining informed consent, the vaccine is  administered by [name].  

## 2014-11-14 NOTE — Assessment & Plan Note (Signed)
Intermittent flares, judicious use of ibuprofen discussed as association with jeart and kidney disease is now  A warning, she is aware Weight loss encouraged

## 2014-11-14 NOTE — Assessment & Plan Note (Signed)
Antibiotics and decongestants prescribed medication also administered at office visit.    

## 2014-11-14 NOTE — Assessment & Plan Note (Signed)
Controlled, no change in medication DASH diet and commitment to daily physical activity for a minimum of 30 minutes discussed and encouraged, as a part of hypertension management. The importance of attaining a healthy weight is also discussed.  BP/Weight 10/19/2014 10/14/2014 10/13/2014 10/11/2014 10/11/2014 09/09/2014 10/08/7503  Systolic BP 183 358 - 251 898 421 031  Diastolic BP 62 83 - 72 90 78 74  Wt. (Lbs) 248 - 243 242 242 254.8 245.4  BMI 46.88 - 47.46 47.26 45.75 48.17 46.39    CMP Latest Ref Rng 10/13/2014 10/11/2014 10/11/2014  Glucose 70 - 99 mg/dL 510(H) 325(H) 556(HH)  BUN 6 - 23 mg/dL 28(H) 27(H) 26(H)  Creatinine 0.50 - 1.10 mg/dL 1.47(H) 1.20(H) 1.50(H)  Sodium 135 - 145 mmol/L 133(L) 137 128(L)  Potassium 3.5 - 5.1 mmol/L 3.4(L) 2.9(L) 3.4(L)  Chloride 96 - 112 mmol/L 96 95(L) 89(L)  CO2 19 - 32 mmol/L 26 - 29  Calcium 8.4 - 10.5 mg/dL 7.4(L) - 8.0(L)  Total Protein 6.0 - 8.3 g/dL - - -  Total Bilirubin 0.2 - 1.2 mg/dL - - -  Alkaline Phos 39 - 117 U/L - - -  AST 0 - 37 U/L - - -  ALT 0 - 35 U/L - - -

## 2014-11-14 NOTE — Assessment & Plan Note (Signed)
Controlled, no change in medication Patient educated about the importance of limiting  Carbohydrate intake , the need to commit to daily physical activity for a minimum of 30 minutes , and to commit weight loss. The fact that changes in all these areas will reduce or eliminate all together the development of diabetes is stressed.   Diabetic Labs Latest Ref Rng 10/13/2014 10/11/2014 10/11/2014 09/09/2014 09/07/2014  HbA1c <5.7 % - - - - 6.5(H)  Microalbumin <2.0 mg/dL - - - 2.1(H) -  Micro/Creat Ratio 0.0 - 30.0 mg/g - - - 19.1 -  Chol 0 - 200 mg/dL - - - - -  HDL >39 mg/dL - - - - -  Calc LDL 0 - 99 mg/dL - - - - -  Triglycerides <150 mg/dL - - - - -  Creatinine 0.50 - 1.10 mg/dL 1.47(H) 1.20(H) 1.50(H) - 1.08   BP/Weight 10/19/2014 10/14/2014 10/13/2014 10/11/2014 10/11/2014 09/09/2014 0/51/1021  Systolic BP 117 356 - 701 410 301 314  Diastolic BP 62 83 - 72 90 78 74  Wt. (Lbs) 248 - 243 242 242 254.8 245.4  BMI 46.88 - 47.46 47.26 45.75 48.17 46.39   Foot/eye exam completion dates Latest Ref Rng 09/15/2014 09/09/2014  Eye Exam No Retinopathy No Retinopathy -  Foot exam Order - - -  Foot Form Completion - - Done

## 2014-11-18 ENCOUNTER — Other Ambulatory Visit: Payer: Self-pay | Admitting: General Surgery

## 2014-11-19 ENCOUNTER — Ambulatory Visit: Payer: BC Managed Care – PPO | Admitting: Certified Nurse Midwife

## 2014-11-19 ENCOUNTER — Telehealth: Payer: Self-pay | Admitting: Certified Nurse Midwife

## 2014-11-19 NOTE — Telephone Encounter (Signed)
Pt unable to keep appointment this morning due to car trouble. No alternate transportation and pt does not live locally. Pt rescheduled for May 19 and added to wait list per request.

## 2014-12-03 ENCOUNTER — Ambulatory Visit (HOSPITAL_COMMUNITY)
Admission: RE | Admit: 2014-12-03 | Discharge: 2014-12-03 | Disposition: A | Discharge status: Home / Self Care | Payer: BLUE CROSS/BLUE SHIELD | Source: Ambulatory Visit | Attending: General Surgery | Admitting: General Surgery

## 2014-12-03 DIAGNOSIS — E1129 Type 2 diabetes mellitus with other diabetic kidney complication: Secondary | ICD-10-CM | POA: Insufficient documentation

## 2014-12-03 DIAGNOSIS — K219 Gastro-esophageal reflux disease without esophagitis: Secondary | ICD-10-CM | POA: Insufficient documentation

## 2014-12-03 DIAGNOSIS — E559 Vitamin D deficiency, unspecified: Secondary | ICD-10-CM | POA: Diagnosis not present

## 2014-12-03 DIAGNOSIS — K449 Diaphragmatic hernia without obstruction or gangrene: Secondary | ICD-10-CM | POA: Insufficient documentation

## 2014-12-03 DIAGNOSIS — N2581 Secondary hyperparathyroidism of renal origin: Secondary | ICD-10-CM | POA: Insufficient documentation

## 2014-12-03 DIAGNOSIS — I10 Essential (primary) hypertension: Secondary | ICD-10-CM | POA: Insufficient documentation

## 2014-12-03 DIAGNOSIS — Z6841 Body Mass Index (BMI) 40.0 and over, adult: Secondary | ICD-10-CM | POA: Insufficient documentation

## 2014-12-13 ENCOUNTER — Encounter: Payer: Self-pay | Admitting: Family Medicine

## 2014-12-22 ENCOUNTER — Other Ambulatory Visit: Payer: Self-pay | Admitting: Family Medicine

## 2014-12-24 ENCOUNTER — Encounter (HOSPITAL_COMMUNITY)
Admission: RE | Disposition: A | Discharge status: Home / Self Care | Payer: Self-pay | Source: Ambulatory Visit | Attending: General Surgery

## 2014-12-24 ENCOUNTER — Ambulatory Visit (HOSPITAL_COMMUNITY)
Admission: RE | Admit: 2014-12-24 | Discharge: 2014-12-24 | Disposition: A | Discharge status: Home / Self Care | Payer: BLUE CROSS/BLUE SHIELD | Source: Ambulatory Visit | Attending: General Surgery | Admitting: General Surgery

## 2014-12-24 HISTORY — PX: BREATH TEK H PYLORI: SHX5422

## 2014-12-24 SURGERY — BREATH TEST, FOR HELICOBACTER PYLORI

## 2014-12-24 NOTE — Progress Notes (Deleted)
   12/24/14 [place]  Referring MD Dr Alphonsa Overall  Time of Last PO Intake 2330  Baseline Breath At: 0843  Pranactin Given At: 0845  Post-Dose Breath At: 0900  Sample 1 3.2%  Sample 2 3.0%  Test Postive  Comments faxed resul to Dr Pollie Friar office

## 2014-12-24 NOTE — Progress Notes (Signed)
   12/24/14 [place]  Referring MD Dr Greer Pickerel  Time of Last PO Intake 2330  Baseline Breath At: 5102  Pranactin Given At: 0845  Post-Dose Breath At: 0900  Sample 1 3.2%  Sample 2 3.0%  Test Postive

## 2014-12-27 ENCOUNTER — Encounter (HOSPITAL_COMMUNITY): Payer: Self-pay | Admitting: General Surgery

## 2014-12-30 ENCOUNTER — Encounter: Payer: Self-pay | Admitting: Certified Nurse Midwife

## 2014-12-30 ENCOUNTER — Ambulatory Visit (INDEPENDENT_AMBULATORY_CARE_PROVIDER_SITE_OTHER): Payer: BLUE CROSS/BLUE SHIELD | Admitting: Certified Nurse Midwife

## 2014-12-30 VITALS — BP 138/76 | HR 88 | Resp 18 | Ht 59.25 in | Wt 253.0 lb

## 2014-12-30 DIAGNOSIS — Z01419 Encounter for gynecological examination (general) (routine) without abnormal findings: Secondary | ICD-10-CM | POA: Diagnosis not present

## 2014-12-30 NOTE — Patient Instructions (Signed)

## 2014-12-30 NOTE — Progress Notes (Signed)
54 y.o. G0P0000 Married  African American Fe here for annual exam. Menopausal no HRT. Denies vaginal bleeding or vaginal dryness. Sees PCP for Hypertension/hypothyroid/ Diabetes, and cholesterol management. Patient scheduled for gastric sleeve surgery in June. Having all her pre op labs next week. No gyn health issues today.  Patient's last menstrual period was 10/31/2002.          Sexually active: Yes. The current method of family planning is status post hysterectomy.    Exercising: No.  The patient does not participate in regular exercise at present. Smoker:  no  Health Maintenance: Pap:  11/12/13 Negative  MMG:  03/11/14 BIRADS1:Neg Self Breast Check: yes, once a month. Colonoscopy:  07/2013 Polyps - repeat 5 years  BMD:   2006  TDaP:  2015 at PCP Labs: PCP   reports that she has never smoked. She has never used smokeless tobacco. She reports that she does not drink alcohol or use illicit drugs.  Past Medical History  Diagnosis Date  . Thyroid disease   . Hypertension   . Allergy   . Diabetes mellitus   . Vitamin D deficiency   . Obesity   . Secondary hyperthyroidism   . Hypersomnia with sleep apnea   . Arthritis   . Chronic bronchitis   . Fibroid uterus   . Menorrhagia   . Hx of iron deficiency anemia   . Endometriosis   . 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 Abdominal hysterectomy  2004    supracervical hysterectomy  . Breast surgery  2005    reduction  . Dilation and curettage of uterus      age 21  . Breath tek h pylori [name] 12/24/2014    Procedure: BREATH TEK H PYLORI;  Surgeon: Greer Pickerel, MD;  Location: [facility];  Service: General;  Laterality: [name];    Current Outpatient Prescriptions  Medication Sig Dispense Refill  . ergocalciferol (VITAMIN D2) 50000 UNITS capsule Take 1 capsule (50,000 Units total) by mouth once a week. One capsule once weekly 4 capsule 7  . folic acid (FOLVITE) 1 MG tablet Take 1 mg by  mouth daily.    [name] gabapentin (NEURONTIN) 300 MG capsule Take 2 capsules by mouth at bedtime as needed.    . metFORMIN (GLUCOPHAGE) 1000 MG tablet Take 1 tablet (1,000 mg total) by mouth 2 (two) times daily with a meal. 60 tablet 5  . methotrexate (RHEUMATREX) 2.5 MG tablet Caution:Chemotherapy. Protect from light. Four tablets once weekly 4 tablet 0  . mupirocin cream (BACTROBAN) 2 % Apply 1 application topically daily.    [name] omeprazole (PRILOSEC) 20 MG capsule Take 20 mg by mouth daily.    . potassium chloride SA (K-DUR,KLOR-CON) 20 MEQ tablet Take 1 tablet (20 mEq total) by mouth 2 (two) times daily. 60 tablet 5  . pravastatin (PRAVACHOL) 10 MG tablet Take 1 tablet (10 mg total) by mouth daily. 30 tablet 5  . triamterene-hydrochlorothiazide (MAXZIDE-25) 37.5-25 MG per tablet TAKE 1 TABLET BY MOUTH DAILY. 30 tablet 5   No current facility-administered medications for this visit.    Family History  Problem Relation Age of Onset  . Hypertension Mother   . Diabetes Mother   . Diabetes Father   . Stroke Father   . Hypertension Sister     x 2  . Breast cancer Sister   . Stroke Maternal Aunt   . Colon cancer Maternal Grandfather  ROS:  Pertinent items are noted in HPI.  Otherwise, a comprehensive ROS was negative.  Exam:   BP 138/76 mmHg  Pulse 88  Resp 18  Ht 4' 11.25" (1.505 m)  Wt 253 lb (114.76 kg)  BMI 50.67 kg/m2  LMP 10/31/2002 Height: 4' 11.25" (150.5 cm) Ht Readings from Last 3 Encounters:  12/30/14 4' 11.25" (1.505 m)  10/19/14 5\' 1"  (1.549 m)  10/13/14 5' (1.524 m)    General appearance: alert, cooperative and appears stated age Head: Normocephalic, without obvious abnormality, atraumatic Neck: no adenopathy, supple, symmetrical, trachea midline and thyroid normal to inspection and palpation Lungs: clear to auscultation bilaterally Breasts: normal appearance, no masses or tenderness, No nipple retraction or dimpling, No nipple discharge or bleeding, No axillary  or supraclavicular adenopathy Heart: regular rate and rhythm Abdomen: soft, non-tender; no masses,  no organomegaly Extremities: extremities normal, atraumatic, no cyanosis or edema Skin: Skin color, texture, turgor normal. No rashes or lesions Lymph nodes: Cervical, supraclavicular, and axillary nodes normal. No abnormal inguinal nodes palpated Neurologic: Grossly normal   Pelvic: External genitalia:  no lesions              Urethra:  normal appearing urethra with no masses, tenderness or lesions              Bartholin's and Skene's: normal                 Vagina: normal appearing vagina with normal color and discharge, no lesions              Cervix: normal,non tender, no lesions              Pap taken: No. Bimanual Exam:  Uterus:  normal but limited palpation due to body habitus, no large masses              Adnexa: normal adnexa and no mass, fullness, tenderness  No large masses, limited by body habitus               Rectovaginal: Confirms               Anus:  normal sphincter tone, no lesions  Chaperone present: Yes  A:  Well Woman with normal exam  Menopausal no HRT  Hypertension, Hypothyroid, Diabetes, Cholesterol elevation management with PCP  Upcoming GI sleeve surgery  P:   Reviewed health and wellness pertinent to exam  Pap smear not taken today  counseled on breast self exam, mammography screening, menopause, adequate intake of calcium and vitamin D, Kegel's exercises Wished well with her surgery.  return annually or prn  An After Visit Summary was printed and given to the patient.

## 2014-12-31 ENCOUNTER — Encounter: Payer: BLUE CROSS/BLUE SHIELD | Attending: General Surgery | Admitting: Dietician

## 2014-12-31 ENCOUNTER — Encounter: Payer: Self-pay | Admitting: Dietician

## 2014-12-31 VITALS — Ht 60.0 in | Wt 251.8 lb

## 2014-12-31 DIAGNOSIS — Z713 Dietary counseling and surveillance: Secondary | ICD-10-CM | POA: Diagnosis not present

## 2014-12-31 DIAGNOSIS — Z6841 Body Mass Index (BMI) 40.0 and over, adult: Secondary | ICD-10-CM | POA: Diagnosis not present

## 2014-12-31 NOTE — Progress Notes (Signed)
Reviewed personally.  M. Suzanne Catherine Cubero, MD.  

## 2014-12-31 NOTE — Progress Notes (Signed)
  Pre-Op Assessment Visit:  Pre-Operative Gastric sleeve Surgery  Medical Nutrition Therapy:  Appt start time: 0800   End time:  0830.  Patient was seen on 12/31/14 for Pre-Operative Nutrition Assessment. Assessment and letter of approval faxed to [facility] Bariatric Surgery Program coordinator on 12/31/14.   Preferred Learning Style:   No preference indicated   Learning Readiness:   Ready  Handouts given during visit include:  Pre-Op Goals Bariatric Surgery Protein Shakes   During the appointment today the following Pre-Op Goals were reviewed with the patient: Maintain or lose weight as instructed by your surgeon Make healthy food choices Begin to limit portion sizes Limited concentrated sugars and fried foods Keep fat/sugar in the single digits per serving on   food labels Practice CHEWING your food  (aim for 30 chews per bite or until applesauce consistency) Practice not drinking 15 minutes before, during, and 30 minutes after each meal/snack Avoid all carbonated beverages  Avoid/limit caffeinated beverages  Avoid all sugar-sweetened beverages Consume 3 meals per day; eat every 3-5 hours Make a list of non-food related activities Aim for 64-100 ounces of FLUID daily  Aim for at least 60-80 grams of PROTEIN daily Look for a liquid protein source that contain ?15 g protein and ?5 g carbohydrate  (ex: shakes, drinks, shots)  Patient-Centered Goals: -Overall health -Less medication -Better mobility, breathing easier -More energy  Scale of 1-10: confidence (10) / importance (10)  Demonstrated degree of understanding via:  Teach Back  Teaching Method Utilized:  Visual Auditory Hands on  Barriers to learning/adherence to lifestyle change: none  Patient to call the [facility] to enroll in Pre-Op and Post-Op Nutrition Education when surgery date is scheduled.

## 2015-01-02 LAB — COMPLETE METABOLIC PANEL WITH GFR
ALBUMIN: 3.6 g/dL (ref 3.5–5.2)
ALK PHOS: 40 U/L (ref 39–117)
ALT: 9 U/L (ref 0–35)
AST: 12 U/L (ref 0–37)
BUN: 18 mg/dL (ref 6–23)
CALCIUM: 8.2 mg/dL — AB (ref 8.4–10.5)
CO2: 29 mEq/L (ref 19–32)
Chloride: 103 mEq/L (ref 96–112)
Creat: 1.12 mg/dL — ABNORMAL HIGH (ref 0.50–1.10)
GFR, EST AFRICAN AMERICAN: 66 mL/min
GFR, EST NON AFRICAN AMERICAN: 57 mL/min — AB
GLUCOSE: 119 mg/dL — AB (ref 70–99)
POTASSIUM: 3.6 meq/L (ref 3.5–5.3)
SODIUM: 141 meq/L (ref 135–145)
Total Bilirubin: 0.3 mg/dL (ref 0.2–1.2)
Total Protein: 7 g/dL (ref 6.0–8.3)

## 2015-01-02 LAB — HEMOGLOBIN A1C
HEMOGLOBIN A1C: 7.7 % — AB (ref <5.7)
MEAN PLASMA GLUCOSE: 174 mg/dL — AB (ref <117)

## 2015-01-02 LAB — LIPID PANEL
Cholesterol: 122 mg/dL (ref 0–200)
HDL: 34 mg/dL — ABNORMAL LOW (ref >=46)
LDL Cholesterol: 62 mg/dL (ref 0–99)
TRIGLYCERIDES: 129 mg/dL (ref <150)
Total CHOL/HDL Ratio: 3.6 Ratio
VLDL: 26 mg/dL (ref 0–40)

## 2015-01-03 LAB — VITAMIN D 25 HYDROXY (VIT D DEFICIENCY, FRACTURES): Vit D, 25-Hydroxy: 20 ng/mL — ABNORMAL LOW (ref 30–100)

## 2015-01-05 ENCOUNTER — Ambulatory Visit (INDEPENDENT_AMBULATORY_CARE_PROVIDER_SITE_OTHER): Payer: BLUE CROSS/BLUE SHIELD | Admitting: Family Medicine

## 2015-01-05 ENCOUNTER — Encounter: Payer: Self-pay | Admitting: Family Medicine

## 2015-01-05 VITALS — BP 118/74 | HR 85 | Resp 16 | Ht 60.0 in | Wt 250.1 lb

## 2015-01-05 DIAGNOSIS — R059 Cough, unspecified: Secondary | ICD-10-CM | POA: Insufficient documentation

## 2015-01-05 DIAGNOSIS — Z6841 Body Mass Index (BMI) 40.0 and over, adult: Secondary | ICD-10-CM

## 2015-01-05 DIAGNOSIS — R05 Cough: Secondary | ICD-10-CM

## 2015-01-05 DIAGNOSIS — E038 Other specified hypothyroidism: Secondary | ICD-10-CM

## 2015-01-05 DIAGNOSIS — J302 Other seasonal allergic rhinitis: Secondary | ICD-10-CM

## 2015-01-05 DIAGNOSIS — I10 Essential (primary) hypertension: Secondary | ICD-10-CM

## 2015-01-05 DIAGNOSIS — R058 Other specified cough: Secondary | ICD-10-CM | POA: Insufficient documentation

## 2015-01-05 DIAGNOSIS — L989 Disorder of the skin and subcutaneous tissue, unspecified: Secondary | ICD-10-CM

## 2015-01-05 DIAGNOSIS — E559 Vitamin D deficiency, unspecified: Secondary | ICD-10-CM

## 2015-01-05 DIAGNOSIS — E785 Hyperlipidemia, unspecified: Secondary | ICD-10-CM

## 2015-01-05 DIAGNOSIS — E1129 Type 2 diabetes mellitus with other diabetic kidney complication: Secondary | ICD-10-CM | POA: Diagnosis not present

## 2015-01-05 HISTORY — DX: Other seasonal allergic rhinitis: J30.2

## 2015-01-05 MED ORDER — PROMETHAZINE-DM 6.25-15 MG/5ML PO SYRP: ORAL_SOLUTION | ORAL | Status: DC

## 2015-01-05 MED ORDER — ERGOCALCIFEROL 1.25 MG (50000 UT) PO CAPS: 50000.0000 [IU] | ORAL_CAPSULE | ORAL | Status: DC

## 2015-01-05 MED ORDER — FLUTICASONE PROPIONATE 50 MCG/ACT NA SUSP: 2.0000 | Freq: Every day | NASAL | Status: DC

## 2015-01-05 MED ORDER — CANAGLIFLOZIN 300 MG PO TABS: 300.0000 mg | ORAL_TABLET | Freq: Every day | ORAL | Status: DC

## 2015-01-05 MED ORDER — MONTELUKAST SODIUM 10 MG PO TABS: 10.0000 mg | ORAL_TABLET | Freq: Every day | ORAL | Status: DC

## 2015-01-05 MED ORDER — BENZONATATE 100 MG PO CAPS: 100.0000 mg | ORAL_CAPSULE | Freq: Two times a day (BID) | ORAL | Status: DC | PRN

## 2015-01-05 MED ORDER — PREDNISONE 5 MG PO TABS: 5.0000 mg | ORAL_TABLET | Freq: Two times a day (BID) | ORAL | Status: AC

## 2015-01-05 NOTE — Assessment & Plan Note (Signed)
Followed by endo.  

## 2015-01-05 NOTE — Assessment & Plan Note (Signed)
Controlled, no change in medication Hyperlipidemia:Low fat diet discussed and encouraged.  Needs to start daily exercise   Lipid Panel  Lab Results  Component Value Date   CHOL 122 01/01/2015   HDL 34* 01/01/2015   LDLCALC 62 01/01/2015   TRIG 129 01/01/2015   CHOLHDL 3.6 01/01/2015

## 2015-01-05 NOTE — Assessment & Plan Note (Addendum)
Deteriorated, Add invokana  Patient educated about the importance of limiting  Carbohydrate intake , the need to commit to daily physical activity for a minimum of 30 minutes , and to commit weight loss.  Diabetic Labs Latest Ref Rng 01/01/2015 10/13/2014 10/11/2014 10/11/2014 09/09/2014  HbA1c <5.7 % 7.7(H) - - - -  Microalbumin <2.0 mg/dL - - - - 2.1(H)  Micro/Creat Ratio 0.0 - 30.0 mg/g - - - - 19.1  Chol 0 - 200 mg/dL 122 - - - -  HDL >=46 mg/dL 34(L) - - - -  Calc LDL 0 - 99 mg/dL 62 - - - -  Triglycerides <150 mg/dL 129 - - - -  Creatinine 0.50 - 1.10 mg/dL 1.12(H) 1.47(H) 1.20(H) 1.50(H) -   BP/Weight 01/05/2015 12/31/2014 12/30/2014 10/19/2014 10/14/2014 10/13/2014 9/67/8938  Systolic BP 101 - 751 025 852 - 778  Diastolic BP 74 - 76 62 83 - 72  Wt. (Lbs) 250.12 251.8 253 248 - 243 242  BMI 48.85 49.18 50.67 46.88 - 47.46 47.26   Foot/eye exam completion dates Latest Ref Rng 09/15/2014 09/09/2014  Eye Exam No Retinopathy No Retinopathy -  Foot exam Order - - -  Foot Form Completion - - Done

## 2015-01-05 NOTE — Assessment & Plan Note (Signed)
Uncontrolled and non productive

## 2015-01-05 NOTE — Assessment & Plan Note (Signed)
Unchanged, has bariatric surgery upcoming Patient re-educated about  the importance of commitment to a  minimum of 150 minutes of exercise per week.  The importance of healthy food choices with portion control discussed. Encouraged to start a food diary, count calories and to consider  joining a support group. Sample diet sheets offered. Goals set by the patient for the next several months.   Weight /BMI 01/05/2015 12/31/2014 12/30/2014  WEIGHT 250 lb 1.9 oz 251 lb 12.8 oz 253 lb  HEIGHT 5\' 0"  5\' 0"  4' 11.25"  BMI 48.85 kg/m2 49.18 kg/m2 50.67 kg/m2    Current exercise per week 40 minutes.

## 2015-01-05 NOTE — Assessment & Plan Note (Signed)
Uncontrolled, short course of prednisone, singulair and tessalon perles as needed

## 2015-01-05 NOTE — Progress Notes (Signed)
   Subjective:    Patient ID: Sabrina [name], female    DOB: 04-24-1961, 54 y.o.   MRN: [id number]  HPI    Review of Systems     Objective:   Physical Exam        Assessment & Plan:

## 2015-01-05 NOTE — Assessment & Plan Note (Signed)
Controlled, no change in medication DASH diet and commitment to daily physical activity for a minimum of 30 minutes discussed and encouraged, as a part of hypertension management. The importance of attaining a healthy weight is also discussed.  BP/Weight 01/05/2015 12/31/2014 12/30/2014 10/19/2014 10/14/2014 10/13/2014 8/45/3646  Systolic BP 803 - 212 248 250 - 037  Diastolic BP 74 - 76 62 83 - 72  Wt. (Lbs) 250.12 251.8 253 248 - 243 242  BMI 48.85 49.18 50.67 46.88 - 47.46 47.26

## 2015-01-05 NOTE — Progress Notes (Signed)
Sabrina [name]     MRN: [id number]      DOB: Nov 15, 1960   HPI Sabrina [name] is here for follow up and re-evaluation of chronic medical conditions, medication management and review of any available recent lab and radiology data.  Preventive health is updated, specifically  Cancer screening and Immunization.   Questions or concerns regarding consultations or procedures which the PT has had in the interim are  Addressed.Will be having bariatric surgery in the near future The PT denies any adverse reactions to current medications since the last visit.  2 week h/o increased cough , non productive, excess nasal drainage, no fever or chills \\Rash  is improved ROS Denies recent fever or chills. Denies sinus pressure, nasal congestion, ear pain or sore throat. Denies chest congestion, productive cough or wheezing. Denies chest pains, palpitations and leg swelling Denies abdominal pain, nausea, vomiting,diarrhea or constipation.   Denies dysuria, frequency, hesitancy or incontinence. Denies joint pain, swelling and limitation in mobility. Denies headaches, seizures, numbness, or tingling. Denies depression, anxiety or insomnia. Denies skin break down or rash.  PE  BP 118/74 mmHg  Pulse 85  Resp 16  Ht 5' (1.524 m)  Wt 250 lb 1.9 oz (113.454 kg)  BMI 48.85 kg/m2  SpO2 94%  LMP 10/31/2002  Patient alert and oriented and in no cardiopulmonary distress.  HEENT: No facial asymmetry, EOMI,   oropharynx pink and moist.  Neck supple no JVD, no mass.No sinus tenderness, TM clear, erythema and edema of nasal mucosa  Chest: Clear to auscultation bilaterally.  CVS: S1, S2 no murmurs, no S3.Regular rate.  ABD: Soft non tender.   Ext: No edema  MS: Adequate ROM spine, shoulders, hips and knees.  Skin: Intact, no ulcerations or rash noted.  Psych: Good eye contact, normal affect. Memory intact not anxious or depressed appearing.  CNS: CN 2-12 intact, power,  normal throughout.no  focal deficits noted.   Assessment & Plan   Seasonal allergies Uncontrolled start daily nasonex and singulair   Cough Uncontrolled and non productive   Essential hypertension Controlled, no change in medication DASH diet and commitment to daily physical activity for a minimum of 30 minutes discussed and encouraged, as a part of hypertension management. The importance of attaining a healthy weight is also discussed.  BP/Weight 01/05/2015 12/31/2014 12/30/2014 10/19/2014 10/14/2014 10/13/2014 5/36/1443  Systolic BP 154 - 008 676 195 - 093  Diastolic BP 74 - 76 62 83 - 72  Wt. (Lbs) 250.12 251.8 253 248 - 243 242  BMI 48.85 49.18 50.67 46.88 - 47.46 47.26         Hypothyroidism Followed by endo   Diabetes mellitus with renal manifestation Deteriorated, Add invokana  Patient educated about the importance of limiting  Carbohydrate intake , the need to commit to daily physical activity for a minimum of 30 minutes , and to commit weight loss.  Diabetic Labs Latest Ref Rng 01/01/2015 10/13/2014 10/11/2014 10/11/2014 09/09/2014  HbA1c <5.7 % 7.7(H) - - - -  Microalbumin <2.0 mg/dL - - - - 2.1(H)  Micro/Creat Ratio 0.0 - 30.0 mg/g - - - - 19.1  Chol 0 - 200 mg/dL 122 - - - -  HDL >=46 mg/dL 34(L) - - - -  Calc LDL 0 - 99 mg/dL 62 - - - -  Triglycerides <150 mg/dL 129 - - - -  Creatinine 0.50 - 1.10 mg/dL 1.12(H) 1.47(H) 1.20(H) 1.50(H) -   BP/Weight 01/05/2015 12/31/2014 12/30/2014 10/19/2014 10/14/2014 10/13/2014 2/67/1245  Systolic  BP 118 - 138 140 712 - 197  Diastolic BP 74 - 76 62 83 - 72  Wt. (Lbs) 250.12 251.8 253 248 - 243 242  BMI 48.85 49.18 50.67 46.88 - 47.46 47.26   Foot/eye exam completion dates Latest Ref Rng 09/15/2014 09/09/2014  Eye Exam No Retinopathy No Retinopathy -  Foot exam Order - - -  Foot Form Completion - - Done        Skin lesions, generalized Improved, maintained on methotrexate   Morbid obesity with BMI of 45.0-49.9, adult Unchanged, has bariatric  surgery upcoming Patient re-educated about  the importance of commitment to a  minimum of 150 minutes of exercise per week.  The importance of healthy food choices with portion control discussed. Encouraged to start a food diary, count calories and to consider  joining a support group. Sample diet sheets offered. Goals set by the patient for the next several months.   Weight /BMI 01/05/2015 12/31/2014 12/30/2014  WEIGHT 250 lb 1.9 oz 251 lb 12.8 oz 253 lb  HEIGHT 5\' 0"  5\' 0"  4' 11.25"  BMI 48.85 kg/m2 49.18 kg/m2 50.67 kg/m2    Current exercise per week 40 minutes.    Hyperlipidemia LDL goal <100 Controlled, no change in medication Hyperlipidemia:Low fat diet discussed and encouraged.  Needs to start daily exercise   Lipid Panel  Lab Results  Component Value Date   CHOL 122 01/01/2015   HDL 34* 01/01/2015   LDLCALC 62 01/01/2015   TRIG 129 01/01/2015   CHOLHDL 3.6 01/01/2015         Allergic cough Uncontrolled, short course of prednisone, singulair and tessalon perles as needed

## 2015-01-05 NOTE — Assessment & Plan Note (Signed)
Improved, maintained on methotrexate

## 2015-01-05 NOTE — Assessment & Plan Note (Signed)
Uncontrolled start daily nasonex and singulair

## 2015-01-05 NOTE — Patient Instructions (Signed)
F/u in 4 month, cqll if you need me before  New additional med for diabetes is invokana one daily  Five medications are sent in for uncontrolled allergies and cough, you will remain on daily singulair and flonase  All the best with upcoming surgery  Please work on good  health habits so that your health will improve. 1. Commitment to daily physical activity for 30 to 60  minutes, if you are able to do this.  2. Commitment to wise food choices. Aim for half of your  food intake to be vegetable and fruit, one quarter starchy foods, and one quarter protein. Try to eat on a regular schedule  3 meals per day, snacking between meals should be limited to vegetables or fruits or small portions of nuts. 64 ounces of water per day is generally recommended, unless you have specific health conditions, like heart failure or kidney failure where you will need to limit fluid intake.  3. Commitment to sufficient and a  good quality of physical and mental rest daily, generally between 6 to 8 hours per day.  WITH PERSISTANCE AND PERSEVERANCE, THE IMPOSSIBLE , BECOMES THE NORM!  Thanks for choosing [facility], we consider it a privelige to serve you.  Fasting lipid, cmp and EGFR, HBA1C and vit D in 4 month

## 2015-01-11 ENCOUNTER — Ambulatory Visit: Payer: BLUE CROSS/BLUE SHIELD | Admitting: Cardiology

## 2015-01-14 ENCOUNTER — Ambulatory Visit (INDEPENDENT_AMBULATORY_CARE_PROVIDER_SITE_OTHER): Payer: BLUE CROSS/BLUE SHIELD | Admitting: Cardiology

## 2015-01-14 ENCOUNTER — Encounter: Payer: Self-pay | Admitting: Cardiology

## 2015-01-14 VITALS — BP 110/64 | HR 95 | Ht 60.0 in | Wt 248.0 lb

## 2015-01-14 DIAGNOSIS — R0602 Shortness of breath: Secondary | ICD-10-CM | POA: Diagnosis not present

## 2015-01-14 DIAGNOSIS — I10 Essential (primary) hypertension: Secondary | ICD-10-CM

## 2015-01-14 DIAGNOSIS — Z79899 Other long term (current) drug therapy: Secondary | ICD-10-CM

## 2015-01-14 DIAGNOSIS — Z01810 Encounter for preprocedural cardiovascular examination: Secondary | ICD-10-CM

## 2015-01-14 DIAGNOSIS — Z6841 Body Mass Index (BMI) 40.0 and over, adult: Secondary | ICD-10-CM

## 2015-01-14 HISTORY — DX: Other long term (current) drug therapy: Z79.899

## 2015-01-14 NOTE — Patient Instructions (Addendum)
Medication Instructions:  Your physician recommends that you continue on your current medications as directed. Please refer to the Current Medication list given to you today.   Labwork: None  Testing/Procedures: Your physician has requested that you have an echocardiogram. Echocardiography is a painless test that uses sound waves to create images of your heart. It provides your doctor with information about the size and shape of your heart and how well your heart's chambers and valves are working. This procedure takes approximately one hour. There are no restrictions for this procedure.  Dr. Turner recommends you have an EXERCISE MYOVIEW.  Follow-Up: Your physician recommends that you schedule a follow-up appointment AS NEEDED with Dr. Turner pending your study results.   Any Other Special Instructions Will Be Listed Below (If Applicable).   

## 2015-01-14 NOTE — Progress Notes (Signed)
Cardiology Office Note   Date:  01/14/2015   ID:  Sabrina, [name] [birth year], MRN [id number]  PCP:  Sabrina Nakayama, MD    Chief Complaint  Patient presents with  . New Evaluation    cadiac clerance      History of Present Illness: Sabrina [name] is a 54 y.o. female who presents for preoperative cardiac clearance for gastric sleeve surgery.  She has a history of morbid obesity, HTN, dyslipidemia, GERD and DM.  She denies any chest pain.  She denies any dizziness or syncope.  She will notice her heart beating faster with activity but no skipping.   She does get DOE with activity.  She occasionally has some ankle edema.  She has a family history of CVA.  She has never smoked.    Past Medical History  Diagnosis Date  . Thyroid disease   . Hypertension   . Allergy   . Diabetes mellitus   . Vitamin D deficiency   . Obesity   . Secondary hyperthyroidism   . Hypersomnia with sleep apnea   . Arthritis   . Chronic bronchitis   . Fibroid uterus   . Menorrhagia   . Hx of iron deficiency anemia   . Endometriosis   . 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 Abdominal hysterectomy  2004    supracervical hysterectomy  . Breast surgery  2005    reduction  . Dilation and curettage of uterus      age 54  . Breath tek h pylori [name] 12/24/2014    Procedure: BREATH TEK H PYLORI;  Surgeon: Greer Pickerel, MD;  Location: [facility];  Service: General;  Laterality: [name];     Current Outpatient Prescriptions  Medication Sig Dispense Refill  . benzonatate (TESSALON) 100 MG capsule Take 1 capsule (100 mg total) by mouth 2 (two) times daily as needed for cough. 30 capsule 0  . canagliflozin (INVOKANA) 300 MG TABS tablet Take 300 mg by mouth daily before breakfast. 30 tablet 4  . ergocalciferol (VITAMIN D2) 50000 UNITS capsule Take 1 capsule (50,000 Units total) by mouth once a week. One capsule once weekly 4  capsule 5  . folic acid (FOLVITE) 1 MG tablet Take 1 mg by mouth daily.    . metFORMIN (GLUCOPHAGE) 1000 MG tablet Take 1 tablet (1,000 mg total) by mouth 2 (two) times daily with a meal. 60 tablet 5  . methotrexate (RHEUMATREX) 2.5 MG tablet Caution:Chemotherapy. Protect from light. Four tablets once weekly 4 tablet 0  . omeprazole (PRILOSEC) 20 MG capsule Take 20 mg by mouth daily.    . potassium chloride SA (K-DUR,KLOR-CON) 20 MEQ tablet Take 1 tablet (20 mEq total) by mouth 2 (two) times daily. 60 tablet 5  . pravastatin (PRAVACHOL) 10 MG tablet Take 1 tablet (10 mg total) by mouth daily. 30 tablet 5  . triamterene-hydrochlorothiazide (MAXZIDE-25) 37.5-25 MG per tablet TAKE 1 TABLET BY MOUTH DAILY. 30 tablet 5   No current facility-administered medications for this visit.    Allergies:   Ace inhibitors; Codeine; Peanut-containing drug products; Potassium iodide; Amlodipine; and Latex    Social History:  The patient  reports that she has never smoked. She has never used smokeless tobacco. She reports that she does not drink alcohol or use illicit drugs.   Family History:  The  patient's family history includes Arthritis in her father, mother, and sister; Bleeding Disorder in her sister; Breast cancer in her sister; Cancer in her father; Colon cancer in her maternal grandfather; Depression in her father, mother, and sister; Diabetes in her father and mother; Heart disease in her father and mother; Hypertension in her father, mother, and sister; Stroke in her father and maternal aunt; Thyroid disease in her sister.    ROS:  Please see the history of present illness.   Otherwise, review of systems are positive for none.   All other systems are reviewed and negative.    PHYSICAL EXAM: VS:  BP 110/64 mmHg  Pulse 95  Ht 5' (1.524 m)  Wt 248 lb (112.492 kg)  BMI 48.43 kg/m2  SpO2 97%  LMP 10/31/2002 , BMI Body mass index is 48.43 kg/(m^2). GEN: Well nourished, well developed, in no acute  distress HEENT: normal Neck: no JVD, carotid bruits, or masses Cardiac: RRR; no murmurs, rubs, or gallops,no edema  Respiratory:  clear to auscultation bilaterally, normal work of breathing GI: soft, nontender, nondistended, + BS MS: no deformity or atrophy Skin: warm and dry, no rash Neuro:  Strength and sensation are intact Psych: euthymic mood, full affect   EKG:  EKG is not ordered today.    Recent Labs: 09/07/2014: TSH 6.906* 10/11/2014: Hemoglobin 12.9 01/01/2015: ALT 9; BUN 18; Creatinine 1.12*; Potassium 3.6; Sodium 141    Lipid Panel    Component Value Date/Time   CHOL 122 01/01/2015 1039   TRIG 129 01/01/2015 1039   HDL 34* 01/01/2015 1039   CHOLHDL 3.6 01/01/2015 1039   VLDL 26 01/01/2015 1039   LDLCALC 62 01/01/2015 1039      Wt Readings from Last 3 Encounters:  01/14/15 248 lb (112.492 kg)  01/05/15 250 lb 1.9 oz (113.454 kg)  12/31/14 251 lb 12.8 oz (114.216 kg)       ASSESSMENT AND PLAN:  1.  Morbid obesity 2.  Preoperative cardiac clearance for gastric sleeve surgery.  I will get a stress myoview to rule out ischemia and 2D echo to assess LVF and observe for structural heart disease give her CRF of obesity, HTN, dyslipidemia and DM.   3.  HTN - well controlled on diuretic 4.  DM - per PCP 5.  GERD   Current medicines are reviewed at length with the patient today.  The patient does not have concerns regarding medicines.  The following changes have been made:  no change  Labs/ tests ordered today: See above Assessment and Plan No orders of the defined types were placed in this encounter.     Disposition:   FU with me PRN pending results of studies  SignedSueanne Margarita, MD  01/14/2015 9:48 AM    [facility] [address], [place], [place]  [address] Phone: [phone]; Fax: [phone]

## 2015-01-26 ENCOUNTER — Telehealth (HOSPITAL_COMMUNITY): Payer: Self-pay | Admitting: *Deleted

## 2015-01-26 NOTE — Telephone Encounter (Signed)
Patient given detailed instructions per Myocardial Perfusion Study Information Sheet for test on 01/28/15 at 11:45am. Patient Notified to arrive 15 minutes early, and that it is imperative to arrive on time for appointment to keep from having the test rescheduled. Patient verbalized understanding. Hubbard Robinson, RN

## 2015-01-28 ENCOUNTER — Ambulatory Visit (HOSPITAL_COMMUNITY): Payer: BLUE CROSS/BLUE SHIELD

## 2015-01-28 ENCOUNTER — Ambulatory Visit (HOSPITAL_COMMUNITY): Payer: BLUE CROSS/BLUE SHIELD | Attending: Cardiovascular Disease

## 2015-01-28 DIAGNOSIS — R0602 Shortness of breath: Secondary | ICD-10-CM | POA: Insufficient documentation

## 2015-01-28 DIAGNOSIS — I10 Essential (primary) hypertension: Secondary | ICD-10-CM | POA: Insufficient documentation

## 2015-01-28 DIAGNOSIS — Z01810 Encounter for preprocedural cardiovascular examination: Secondary | ICD-10-CM | POA: Insufficient documentation

## 2015-01-28 MED ORDER — TECHNETIUM TC 99M SESTAMIBI GENERIC - CARDIOLITE
33.0000 | Freq: Once | INTRAVENOUS | Status: AC | PRN
  Administered 2015-01-28: 33 via INTRAVENOUS

## 2015-02-03 ENCOUNTER — Ambulatory Visit (HOSPITAL_COMMUNITY): Payer: BLUE CROSS/BLUE SHIELD

## 2015-02-04 ENCOUNTER — Other Ambulatory Visit: Payer: Self-pay

## 2015-02-04 ENCOUNTER — Ambulatory Visit (HOSPITAL_COMMUNITY): Payer: BLUE CROSS/BLUE SHIELD

## 2015-02-04 ENCOUNTER — Ambulatory Visit (HOSPITAL_COMMUNITY): Payer: BLUE CROSS/BLUE SHIELD | Attending: Cardiovascular Disease

## 2015-02-04 DIAGNOSIS — E119 Type 2 diabetes mellitus without complications: Secondary | ICD-10-CM | POA: Diagnosis not present

## 2015-02-04 DIAGNOSIS — R0602 Shortness of breath: Secondary | ICD-10-CM | POA: Diagnosis not present

## 2015-02-04 DIAGNOSIS — Z01810 Encounter for preprocedural cardiovascular examination: Secondary | ICD-10-CM | POA: Diagnosis not present

## 2015-02-04 DIAGNOSIS — E785 Hyperlipidemia, unspecified: Secondary | ICD-10-CM | POA: Diagnosis not present

## 2015-02-04 DIAGNOSIS — Z6841 Body Mass Index (BMI) 40.0 and over, adult: Secondary | ICD-10-CM | POA: Insufficient documentation

## 2015-02-04 DIAGNOSIS — I10 Essential (primary) hypertension: Secondary | ICD-10-CM | POA: Insufficient documentation

## 2015-02-04 LAB — MYOCARDIAL PERFUSION IMAGING
CSEPED: 5 min
CSEPPHR: 151 {beats}/min
Estimated workload: 7 METS
Exercise duration (sec): 7 s
LV dias vol: 69 mL
LVSYSVOL: 19 mL
MPHR: 169 {beats}/min
NUC STRESS TID: 0.81
Percent HR: 89 %
RATE: 0.23
Rest HR: 85 {beats}/min
SDS: 0
SRS: 3
SSS: 0

## 2015-02-04 MED ORDER — TECHNETIUM TC 99M SESTAMIBI GENERIC - CARDIOLITE
33.0000 | Freq: Once | INTRAVENOUS | Status: AC | PRN
  Administered 2015-02-04: 33 via INTRAVENOUS

## 2015-02-07 ENCOUNTER — Ambulatory Visit (INDEPENDENT_AMBULATORY_CARE_PROVIDER_SITE_OTHER): Payer: BLUE CROSS/BLUE SHIELD | Admitting: Family Medicine

## 2015-02-07 ENCOUNTER — Encounter: Payer: Self-pay | Admitting: Family Medicine

## 2015-02-07 ENCOUNTER — Other Ambulatory Visit: Payer: Self-pay | Admitting: Family Medicine

## 2015-02-07 VITALS — BP 130/78 | HR 84 | Resp 16 | Ht 60.0 in | Wt 253.8 lb

## 2015-02-07 DIAGNOSIS — Z23 Encounter for immunization: Secondary | ICD-10-CM | POA: Diagnosis not present

## 2015-02-07 DIAGNOSIS — Z6841 Body Mass Index (BMI) 40.0 and over, adult: Secondary | ICD-10-CM

## 2015-02-07 DIAGNOSIS — E785 Hyperlipidemia, unspecified: Secondary | ICD-10-CM

## 2015-02-07 DIAGNOSIS — I10 Essential (primary) hypertension: Secondary | ICD-10-CM

## 2015-02-07 DIAGNOSIS — J302 Other seasonal allergic rhinitis: Secondary | ICD-10-CM

## 2015-02-07 DIAGNOSIS — E1129 Type 2 diabetes mellitus with other diabetic kidney complication: Secondary | ICD-10-CM | POA: Diagnosis not present

## 2015-02-07 DIAGNOSIS — E038 Other specified hypothyroidism: Secondary | ICD-10-CM

## 2015-02-07 DIAGNOSIS — E8881 Metabolic syndrome: Secondary | ICD-10-CM

## 2015-02-07 DIAGNOSIS — E559 Vitamin D deficiency, unspecified: Secondary | ICD-10-CM

## 2015-02-07 DIAGNOSIS — L989 Disorder of the skin and subcutaneous tissue, unspecified: Secondary | ICD-10-CM

## 2015-02-07 DIAGNOSIS — Z1231 Encounter for screening mammogram for malignant neoplasm of breast: Secondary | ICD-10-CM

## 2015-02-07 NOTE — Patient Instructions (Addendum)
F/u in 3.5 weeks , call if you need me before  Please make Changes in behavior and document success with faces on the calender which you will return with  Weight loss  Goal of   2 to 4 pounds  Prevnar today

## 2015-02-13 DIAGNOSIS — E8881 Metabolic syndrome: Secondary | ICD-10-CM | POA: Insufficient documentation

## 2015-02-13 DIAGNOSIS — Z23 Encounter for immunization: Secondary | ICD-10-CM | POA: Insufficient documentation

## 2015-02-13 NOTE — Assessment & Plan Note (Signed)
Controlled, no change in medication  

## 2015-02-13 NOTE — Assessment & Plan Note (Signed)
Sabrina [name] is reminded of the importance of commitment to daily physical activity for 30 minutes or more, as able and the need to limit carbohydrate intake to 30 to 60 grams per meal to help with blood sugar control.   The need to take medication as prescribed, test blood sugar as directed, and to call between visits if there is a concern that blood sugar is uncontrolled is also discussed.   Sabrina [name] is reminded of the importance of daily foot exam, annual eye examination, and good blood sugar, blood pressure and cholesterol control.  Diabetic Labs Latest Ref Rng 01/01/2015 10/13/2014 10/11/2014 10/11/2014 09/09/2014  HbA1c <5.7 % 7.7(H) - - - -  Microalbumin <2.0 mg/dL - - - - 2.1(H)  Micro/Creat Ratio 0.0 - 30.0 mg/g - - - - 19.1  Chol 0 - 200 mg/dL 122 - - - -  HDL >=46 mg/dL 34(L) - - - -  Calc LDL 0 - 99 mg/dL 62 - - - -  Triglycerides <150 mg/dL 129 - - - -  Creatinine 0.50 - 1.10 mg/dL 1.12(H) 1.47(H) 1.20(H) 1.50(H) -   BP/Weight 02/07/2015 01/14/2015 01/05/2015 12/31/2014 12/30/2014 0/10/4740 12/19/5636  Systolic BP 756 433 295 - 188 416 606  Diastolic BP 78 64 74 - 76 62 83  Wt. (Lbs) 253.8 248 250.12 251.8 253 248 -  BMI 49.57 48.43 48.85 49.18 50.67 46.88 -   Foot/eye exam completion dates Latest Ref Rng 09/15/2014 09/09/2014  Eye Exam No Retinopathy No Retinopathy -  Foot exam Order - - -  Foot Form Completion - - Done

## 2015-02-13 NOTE — Assessment & Plan Note (Signed)
Followed by endo.  

## 2015-02-13 NOTE — Progress Notes (Signed)
Subjective:    Patient ID: Sabrina [name], female    DOB: Jul 30, 1961, 54 y.o.   MRN: [id number]  HPI  The PT is here for follow up and re-evaluation of chronic medical conditions, medication management and review of any available recent lab and radiology data.  Preventive health is updated, specifically  Cancer screening and Immunization.   She has been approved for bariatric surgery and is ready for this to improve her health. Here for 2nd of 3 mandatory  office visits where behavioral changes are discussed and goals set The PT denies any adverse reactions to current medications since the last visit.  There are no new concerns.  States skin is improving There are no specific complaints  /Denies polyuria, polydipsia, blurred vision , or hypoglycemic episodes.      Review of Systems See HPI Denies recent fever or chills. Denies sinus pressure, nasal congestion, ear pain or sore throat. Denies chest congestion, productive cough or wheezing. Denies chest pains, palpitations and leg swelling Denies abdominal pain, nausea, vomiting,diarrhea or constipation.   Denies dysuria, frequency, hesitancy or incontinence. Denies joint pain, swelling and limitation in mobility. Denies headaches, seizures, numbness, or tingling. Denies depression, anxiety or insomnia. .        Objective:   Physical Exam BP 130/78 mmHg  Pulse 84  Resp 16  Ht 5' (1.524 m)  Wt 253 lb 12.8 oz (115.123 kg)  BMI 49.57 kg/m2  SpO2 95%  LMP 10/31/2002 Patient alert and oriented and in no cardiopulmonary distress.  HEENT: No facial asymmetry, EOMI,   oropharynx pink and moist.  Neck supple no JVD, no mass.  Chest: Clear to auscultation bilaterally.  CVS: S1, S2 no murmurs, no S3.Regular rate.  ABD: Soft non tender.   Ext: No edema  MS: Adequate ROM spine, shoulders, hips and knees.  Skin: Intact, no ulcerations  noted.  Psych: Good eye contact, normal affect. Memory intact not anxious or  depressed appearing.  CNS: CN 2-12 intact, power,  normal throughout.no focal deficits noted.        Assessment & Plan:  Essential hypertension Controlled, no change in medication DASH diet and commitment to daily physical activity for a minimum of 30 minutes discussed and encouraged, as a part of hypertension management. The importance of attaining a healthy weight is also discussed.  BP/Weight 02/07/2015 01/14/2015 01/05/2015 12/31/2014 12/30/2014 09/20/32 04/13/7914  Systolic BP 056 979 480 - 165 537 482  Diastolic BP 78 64 74 - 76 62 83  Wt. (Lbs) 253.8 248 250.12 251.8 253 248 -  BMI 49.57 48.43 48.85 49.18 50.67 46.88 -        Diabetes mellitus with renal manifestation Ms. Hird is reminded of the importance of commitment to daily physical activity for 30 minutes or more, as able and the need to limit carbohydrate intake to 30 to 60 grams per meal to help with blood sugar control.   The need to take medication as prescribed, test blood sugar as directed, and to call between visits if there is a concern that blood sugar is uncontrolled is also discussed.   Ms. Blough is reminded of the importance of daily foot exam, annual eye examination, and good blood sugar, blood pressure and cholesterol control.  Diabetic Labs Latest Ref Rng 01/01/2015 10/13/2014 10/11/2014 10/11/2014 09/09/2014  HbA1c <5.7 % 7.7(H) - - - -  Microalbumin <2.0 mg/dL - - - - 2.1(H)  Micro/Creat Ratio 0.0 - 30.0 mg/g - - - - 19.1  Chol  0 - 200 mg/dL 122 - - - -  HDL >=46 mg/dL 34(L) - - - -  Calc LDL 0 - 99 mg/dL 62 - - - -  Triglycerides <150 mg/dL 129 - - - -  Creatinine 0.50 - 1.10 mg/dL 1.12(H) 1.47(H) 1.20(H) 1.50(H) -   BP/Weight 02/07/2015 01/14/2015 01/05/2015 12/31/2014 12/30/2014 12/18/8500 02/17/4127  Systolic BP 786 767 209 - 470 962 836  Diastolic BP 78 64 74 - 76 62 83  Wt. (Lbs) 253.8 248 250.12 251.8 253 248 -  BMI 49.57 48.43 48.85 49.18 50.67 46.88 -   Foot/eye exam completion dates Latest Ref  Rng 09/15/2014 09/09/2014  Eye Exam No Retinopathy No Retinopathy -  Foot exam Order - - -  Foot Form Completion - - Done         Morbid obesity with BMI of 45.0-49.9, adult Deteriorated. This is the 2nd of the required 3 visits where lifestyle change is discussed with specific objectives decided upon, as pt prepares fir bariatric surgery More discipline in eating at set times, and stopping after evening meal, as well as a regular exercise commitment are the major goals for next several weeks Patient re-educated about  the importance of commitment to a  minimum of 150 minutes of exercise per week.  The importance of healthy food choices with portion control discussed. Encouraged to start a food diary, count calories and to consider  joining a support group. Sample diet sheets offered. Goals set by the patient for the next several months.   Weight /BMI 02/07/2015 01/14/2015 01/05/2015  WEIGHT 253 lb 12.8 oz 248 lb 250 lb 1.9 oz  HEIGHT 5\' 0"  5\' 0"  5\' 0"   BMI 49.57 kg/m2 48.43 kg/m2 48.85 kg/m2    Current exercise per week 60 minutes.   Hyperlipidemia LDL goal <100 Hyperlipidemia:Low fat diet discussed and encouraged.   Lipid Panel  Lab Results  Component Value Date   CHOL 122 01/01/2015   HDL 34* 01/01/2015   LDLCALC 62 01/01/2015   TRIG 129 01/01/2015   CHOLHDL 3.6 01/01/2015    Needs to commit to regular exercise to increase HDL. No interest in statin use to reduce CV risk    Hypothyroidism Followed by endo  Metabolic syndrome X The increased risk of cardiovascular disease associated with this diagnosis, and the need to consistently work on lifestyle to change this is discussed. Following  a  heart healthy diet ,commitment to 30 minutes of exercise at least 5 days per week, as well as control of blood sugar and cholesterol , and achieving a healthy weight are all the areas to be addressed .   Vitamin D deficiency Needs to continue weekly vit d  indefinitely  Seasonal allergies Controlled, no change in medication   Skin lesions, generalized Improving , current management continues through dermatology at [facility]  Need for vaccination with 13-polyvalent pneumococcal conjugate vaccine After obtaining informed consent, the vaccine is  administered by [name].

## 2015-02-13 NOTE — Assessment & Plan Note (Signed)
Needs to continue weekly vit d indefinitely 

## 2015-02-13 NOTE — Assessment & Plan Note (Signed)
Controlled, no change in medication DASH diet and commitment to daily physical activity for a minimum of 30 minutes discussed and encouraged, as a part of hypertension management. The importance of attaining a healthy weight is also discussed.  BP/Weight 02/07/2015 01/14/2015 01/05/2015 12/31/2014 12/30/2014 6/0/7371 0/01/2693  Systolic BP 854 627 035 - 009 381 829  Diastolic BP 78 64 74 - 76 62 83  Wt. (Lbs) 253.8 248 250.12 251.8 253 248 -  BMI 49.57 48.43 48.85 49.18 50.67 46.88 -

## 2015-02-13 NOTE — Assessment & Plan Note (Addendum)
Deteriorated. This is the 2nd of the required 3 visits where lifestyle change is discussed with specific objectives decided upon, as pt prepares fir bariatric surgery More discipline in eating at set times, and stopping after evening meal, as well as a regular exercise commitment are the major goals for next several weeks Patient re-educated about  the importance of commitment to a  minimum of 150 minutes of exercise per week.  The importance of healthy food choices with portion control discussed. Encouraged to start a food diary, count calories and to consider  joining a support group. Sample diet sheets offered. Goals set by the patient for the next several months.   Weight /BMI 02/07/2015 01/14/2015 01/05/2015  WEIGHT 253 lb 12.8 oz 248 lb 250 lb 1.9 oz  HEIGHT 5\' 0"  5\' 0"  5\' 0"   BMI 49.57 kg/m2 48.43 kg/m2 48.85 kg/m2    Current exercise per week 60 minutes.

## 2015-02-13 NOTE — Assessment & Plan Note (Signed)
After obtaining informed consent, the vaccine is  administered by [name].  

## 2015-02-13 NOTE — Assessment & Plan Note (Signed)
Hyperlipidemia:Low fat diet discussed and encouraged.   Lipid Panel  Lab Results  Component Value Date   CHOL 122 01/01/2015   HDL 34* 01/01/2015   LDLCALC 62 01/01/2015   TRIG 129 01/01/2015   CHOLHDL 3.6 01/01/2015    Needs to commit to regular exercise to increase HDL. No interest in statin use to reduce CV risk

## 2015-02-13 NOTE — Assessment & Plan Note (Signed)
The increased risk of cardiovascular disease associated with this diagnosis, and the need to consistently work on lifestyle to change this is discussed. Following  a  heart healthy diet ,commitment to 30 minutes of exercise at least 5 days per week, as well as control of blood sugar and cholesterol , and achieving a healthy weight are all the areas to be addressed .  

## 2015-02-13 NOTE — Assessment & Plan Note (Signed)
Improving , current management continues through dermatology at [facility]

## 2015-03-08 ENCOUNTER — Ambulatory Visit (INDEPENDENT_AMBULATORY_CARE_PROVIDER_SITE_OTHER): Payer: 59 | Admitting: Family Medicine

## 2015-03-08 ENCOUNTER — Encounter: Payer: Self-pay | Admitting: Family Medicine

## 2015-03-08 VITALS — BP 130/80 | HR 99 | Resp 16 | Ht 60.0 in | Wt 254.0 lb

## 2015-03-08 DIAGNOSIS — R059 Cough, unspecified: Secondary | ICD-10-CM

## 2015-03-08 DIAGNOSIS — I10 Essential (primary) hypertension: Secondary | ICD-10-CM | POA: Diagnosis not present

## 2015-03-08 DIAGNOSIS — E038 Other specified hypothyroidism: Secondary | ICD-10-CM | POA: Diagnosis not present

## 2015-03-08 DIAGNOSIS — Z6841 Body Mass Index (BMI) 40.0 and over, adult: Secondary | ICD-10-CM

## 2015-03-08 DIAGNOSIS — E785 Hyperlipidemia, unspecified: Secondary | ICD-10-CM | POA: Diagnosis not present

## 2015-03-08 DIAGNOSIS — J302 Other seasonal allergic rhinitis: Secondary | ICD-10-CM

## 2015-03-08 DIAGNOSIS — R252 Cramp and spasm: Secondary | ICD-10-CM

## 2015-03-08 DIAGNOSIS — M349 Systemic sclerosis, unspecified: Secondary | ICD-10-CM

## 2015-03-08 DIAGNOSIS — E1129 Type 2 diabetes mellitus with other diabetic kidney complication: Secondary | ICD-10-CM

## 2015-03-08 DIAGNOSIS — M793 Panniculitis, unspecified: Secondary | ICD-10-CM

## 2015-03-08 DIAGNOSIS — E8881 Metabolic syndrome: Secondary | ICD-10-CM

## 2015-03-08 DIAGNOSIS — E559 Vitamin D deficiency, unspecified: Secondary | ICD-10-CM

## 2015-03-08 DIAGNOSIS — R058 Other specified cough: Secondary | ICD-10-CM

## 2015-03-08 DIAGNOSIS — R05 Cough: Secondary | ICD-10-CM

## 2015-03-08 MED ORDER — BENZONATATE 100 MG PO CAPS: 100.0000 mg | ORAL_CAPSULE | Freq: Two times a day (BID) | ORAL | Status: DC | PRN

## 2015-03-08 NOTE — Progress Notes (Signed)
Subjective:    Patient ID: Sabrina [name], female    DOB: 1960-10-05, 54 y.o.   MRN: [id number]  HPI   Pt in for re evaluation  As she goes through the process  Of qualifying for bariatric surgery , which she desperately needs  She is disappointed in the fact that she has actually gained and not lost weight despite behavioral change, her reported diet is markedly improved, her commitment to physical activity and control of eating after dinner ned work. Portion sizes need to be measured also to ensure that caloric intake is within 1500 cals per day Current  Exercise is 3 days per week , 30 mins on treadmill  Plan to stop eating after dinner , except water.  Reduce portions of starch and protein  Review of Systems See HPI Denies recent fever or chills. Denies sinus pressure, nasal congestion, ear pain or sore throat. Denies chest congestion, productive cough or wheezing. Denies chest pains, palpitations and leg swelling Denies abdominal pain, nausea, vomiting,diarrhea or constipation.   Denies dysuria, frequency, hesitancy or incontinence. Denies joint pain, swelling does have  limitation in mobility primarily because of her size Denies headaches, seizures, numbness, or tingling. Denies depression, anxiety or insomnia. Reports continued improvement in skin condition.        Objective:   Physical Exam BP 130/80 mmHg  Pulse 99  Resp 16  Ht 5' (1.524 m)  Wt 254 lb (115.214 kg)  BMI 49.61 kg/m2  SpO2 95%  LMP 10/31/2002 Patient alert and oriented and in no cardiopulmonary distress.  HEENT: No facial asymmetry, EOMI,   oropharynx pink and moist.  Neck supple no JVD, no mass.  Chest: Clear to auscultation bilaterally.  CVS: S1, S2 no murmurs, no S3.Regular rate.  ABD: Soft non tender.   Ext: No edema  MS: Adequate ROM spine, shoulders, hips and knees.  Skin: Intact, no ulcerations or rash noted.  Psych: Good eye contact, normal affect. Memory intact not  anxious or depressed appearing.  CNS: CN 2-12 intact, power,  normal throughout.no focal deficits noted.        Assessment & Plan:  Morbid obesity with BMI of 45.0-49.9, adult Deteriorated. Patient re-educated about  the importance of commitment to a  minimum of 150 minutes of exercise per week.  The importance of healthy food choices with portion control discussed. Encouraged to start a food diary, count calories and to consider  joining a support group. Sample diet sheets offered. Goals set by the patient for the next several months.   Weight /BMI 03/08/2015 02/07/2015 01/14/2015  WEIGHT 254 lb 253 lb 12.8 oz 248 lb  HEIGHT 5\' 0"  5\' 0"  5\' 0"   BMI 49.61 kg/m2 49.57 kg/m2 48.43 kg/m2    Current exercise per week 90 minutes. Needs to increase to daily exercise for 30 mins each day Reduce portion sizes of protein and starch Nothing but water after dinner Hopefully will have bariatric surgery approval in near future, medically indicated   Diabetes mellitus with renal manifestation Controlled, no change in medication Sabrina [name] is reminded of the importance of commitment to daily physical activity for 30 minutes or more, as able and the need to limit carbohydrate intake to 30 to 60 grams per meal to help with blood sugar control.   The need to take medication as prescribed, test blood sugar as directed, and to call between visits if there is a concern that blood sugar is uncontrolled is also discussed.   Sabrina [name] is  reminded of the importance of daily foot exam, annual eye examination, and good blood sugar, blood pressure and cholesterol control.  Diabetic Labs Latest Ref Rng 03/08/2015 01/01/2015 10/13/2014 10/11/2014 10/11/2014  HbA1c <5.7 % - 7.7(H) - - -  Microalbumin <2.0 mg/dL - - - - -  Micro/Creat Ratio 0.0 - 30.0 mg/g - - - - -  Chol 0 - 200 mg/dL - 122 - - -  HDL >=46 mg/dL - 34(L) - - -  Calc LDL 0 - 99 mg/dL - 62 - - -  Triglycerides <150 mg/dL - 129 - - -    Creatinine 0.50 - 1.05 mg/dL 1.19(H) 1.12(H) 1.47(H) 1.20(H) 1.50(H)   BP/Weight 03/08/2015 02/07/2015 01/14/2015 01/05/2015 12/31/2014 1/61/0960 11/15/4096  Systolic BP 119 147 829 562 - 130 865  Diastolic BP 80 78 64 74 - 76 62  Wt. (Lbs) 254 253.8 248 250.12 251.8 253 248  BMI 49.61 49.57 48.43 48.85 49.18 50.67 46.88   Foot/eye exam completion dates Latest Ref Rng 09/15/2014 09/09/2014  Eye Exam No Retinopathy No Retinopathy -  Foot exam Order - - -  Foot Form Completion - - Done       Updated lab needed at/ before next visit.   Seasonal allergies Controlled, no change in medication States that tessalon perles help with chest congestion/ cough uses as needed  Essential hypertension Controlled, no change in medication DASH diet and commitment to daily physical activity for a minimum of 30 minutes discussed and encouraged, as a part of hypertension management. The importance of attaining a healthy weight is also discussed.  BP/Weight 03/08/2015 02/07/2015 01/14/2015 01/05/2015 12/31/2014 7/84/6962 04/17/2840  Systolic BP 324 401 027 253 - 664 403  Diastolic BP 80 78 64 74 - 76 62  Wt. (Lbs) 254 253.8 248 250.12 251.8 253 248  BMI 49.61 49.57 48.43 48.85 49.18 50.67 46.88        Allergic cough Continue as needed, tessalon perles  Metabolic syndrome X The increased risk of cardiovascular disease associated with this diagnosis, and the need to consistently work on lifestyle to change this is discussed. Following  a  heart healthy diet ,commitment to 30 minutes of exercise at least 5 days per week, as well as control of blood sugar and cholesterol , and achieving a healthy weight are all the areas to be addressed .   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   Lipid Panel   Lab Results  Component Value Date   CHOL 122 01/01/2015   HDL 34* 01/01/2015   LDLCALC 62 01/01/2015   TRIG 129 01/01/2015   CHOLHDL 3.6 01/01/2015   Needs to increase exercise to improve HDL

## 2015-03-08 NOTE — Patient Instructions (Addendum)
F/u as before, call if you need me sooner  Calcium, chem 7 and EGFr and TSH today, if abnormal you need to see Dr Loanne Drilling , I will let you know  Non fasting HBA1C  For September after    Commit to exercise 7 days per week , 30 mins each day  Stop eating after dinner, only water  Reduce portion sizes of starches and proteins in the meals  You are making progress  Sleep is improved, and you are  Now eating on a schedule, and have changed choices and portions

## 2015-03-09 ENCOUNTER — Encounter: Payer: Self-pay | Admitting: Family Medicine

## 2015-03-09 DIAGNOSIS — M349 Systemic sclerosis, unspecified: Secondary | ICD-10-CM | POA: Insufficient documentation

## 2015-03-09 DIAGNOSIS — M793 Panniculitis, unspecified: Secondary | ICD-10-CM | POA: Insufficient documentation

## 2015-03-09 LAB — BASIC METABOLIC PANEL WITH GFR
BUN: 22 mg/dL (ref 7–25)
CO2: 28 mEq/L (ref 20–31)
CREATININE: 1.19 mg/dL — AB (ref 0.50–1.05)
Calcium: 9.4 mg/dL (ref 8.6–10.4)
Chloride: 101 mEq/L (ref 98–110)
GFR, Est African American: 61 mL/min (ref >=60)
GFR, Est Non African American: 53 mL/min — ABNORMAL LOW (ref >=60)
Glucose, Bld: 107 mg/dL — ABNORMAL HIGH (ref 65–99)
POTASSIUM: 3.5 meq/L (ref 3.5–5.3)
Sodium: 140 mEq/L (ref 135–146)

## 2015-03-09 LAB — TSH: TSH: 3.124 u[IU]/mL (ref 0.350–4.500)

## 2015-03-09 LAB — CALCIUM: Calcium: 9.4 mg/dL (ref 8.6–10.4)

## 2015-03-09 NOTE — Assessment & Plan Note (Signed)
Controlled, no change in medication Sabrina [name] is reminded of the importance of commitment to daily physical activity for 30 minutes or more, as able and the need to limit carbohydrate intake to 30 to 60 grams per meal to help with blood sugar control.   The need to take medication as prescribed, test blood sugar as directed, and to call between visits if there is a concern that blood sugar is uncontrolled is also discussed.   Sabrina [name] is reminded of the importance of daily foot exam, annual eye examination, and good blood sugar, blood pressure and cholesterol control.  Diabetic Labs Latest Ref Rng 03/08/2015 01/01/2015 10/13/2014 10/11/2014 10/11/2014  HbA1c <5.7 % - 7.7(H) - - -  Microalbumin <2.0 mg/dL - - - - -  Micro/Creat Ratio 0.0 - 30.0 mg/g - - - - -  Chol 0 - 200 mg/dL - 122 - - -  HDL >=46 mg/dL - 34(L) - - -  Calc LDL 0 - 99 mg/dL - 62 - - -  Triglycerides <150 mg/dL - 129 - - -  Creatinine 0.50 - 1.05 mg/dL 1.19(H) 1.12(H) 1.47(H) 1.20(H) 1.50(H)   BP/Weight 03/08/2015 02/07/2015 01/14/2015 01/05/2015 12/31/2014 4/80/1655 10/17/4825  Systolic BP 078 675 449 201 - 007 121  Diastolic BP 80 78 64 74 - 76 62  Wt. (Lbs) 254 253.8 248 250.12 251.8 253 248  BMI 49.61 49.57 48.43 48.85 49.18 50.67 46.88   Foot/eye exam completion dates Latest Ref Rng 09/15/2014 09/09/2014  Eye Exam No Retinopathy No Retinopathy -  Foot exam Order - - -  Foot Form Completion - - Done       Updated lab needed at/ before next visit.

## 2015-03-09 NOTE — Assessment & Plan Note (Signed)
Continue as needed, tessalon perles

## 2015-03-09 NOTE — Assessment & Plan Note (Addendum)
Chronic methotrexate per dermatology management

## 2015-03-09 NOTE — Assessment & Plan Note (Signed)
Controlled, no change in medication States that tessalon perles help with chest congestion/ cough uses as needed

## 2015-03-09 NOTE — Assessment & Plan Note (Signed)
Deteriorated. Patient re-educated about  the importance of commitment to a  minimum of 150 minutes of exercise per week.  The importance of healthy food choices with portion control discussed. Encouraged to start a food diary, count calories and to consider  joining a support group. Sample diet sheets offered. Goals set by the patient for the next several months.   Weight /BMI 03/08/2015 02/07/2015 01/14/2015  WEIGHT 254 lb 253 lb 12.8 oz 248 lb  HEIGHT 5\' 0"  5\' 0"  5\' 0"   BMI 49.61 kg/m2 49.57 kg/m2 48.43 kg/m2    Current exercise per week 90 minutes. Needs to increase to daily exercise for 30 mins each day Reduce portion sizes of protein and starch Nothing but water after dinner Hopefully will have bariatric surgery approval in near future, medically indicated

## 2015-03-09 NOTE — Assessment & Plan Note (Signed)
On chronic doxycycline reports good response

## 2015-03-09 NOTE — Assessment & Plan Note (Signed)
The increased risk of cardiovascular disease associated with this diagnosis, and the need to consistently work on lifestyle to change this is discussed. Following  a  heart healthy diet ,commitment to 30 minutes of exercise at least 5 days per week, as well as control of blood sugar and cholesterol , and achieving a healthy weight are all the areas to be addressed .  

## 2015-03-09 NOTE — Assessment & Plan Note (Signed)
Continue weekly vit d 

## 2015-03-09 NOTE — Assessment & Plan Note (Signed)
Controlled, no change in medication DASH diet and commitment to daily physical activity for a minimum of 30 minutes discussed and encouraged, as a part of hypertension management. The importance of attaining a healthy weight is also discussed.  BP/Weight 03/08/2015 02/07/2015 01/14/2015 01/05/2015 12/31/2014 7/46/0029 03/17/7307  Systolic BP 569 437 005 259 - 102 890  Diastolic BP 80 78 64 74 - 76 62  Wt. (Lbs) 254 253.8 248 250.12 251.8 253 248  BMI 49.61 49.57 48.43 48.85 49.18 50.67 46.88

## 2015-03-13 NOTE — Assessment & Plan Note (Signed)
Hyperlipidemia:Low fat diet discussed and encouraged.   Lipid Panel  Lab Results  Component Value Date   CHOL 122 01/01/2015   HDL 34* 01/01/2015   LDLCALC 62 01/01/2015   TRIG 129 01/01/2015   CHOLHDL 3.6 01/01/2015   Needs to increase exercise to improve HDL

## 2015-03-14 ENCOUNTER — Ambulatory Visit (HOSPITAL_COMMUNITY)
Admission: RE | Admit: 2015-03-14 | Discharge: 2015-03-14 | Disposition: A | Discharge status: Home / Self Care | Payer: 59 | Source: Ambulatory Visit | Attending: Family Medicine | Admitting: Family Medicine

## 2015-03-14 DIAGNOSIS — Z1231 Encounter for screening mammogram for malignant neoplasm of breast: Secondary | ICD-10-CM | POA: Diagnosis present

## 2015-03-21 ENCOUNTER — Telehealth: Payer: Self-pay

## 2015-03-21 MED ORDER — PREDNISONE 5 MG (21) PO TBPK: ORAL_TABLET | ORAL | Status: DC

## 2015-03-21 NOTE — Telephone Encounter (Signed)
pls send pred 5 mg dose pack #21 only let her  know

## 2015-03-21 NOTE — Telephone Encounter (Signed)
States she was here a couple weeks ago, she's had a dry cough for 2 weeks that won't go away. No fever or sinus drainage. Has tried tessalon perles and states the only thing that helps is prednisone. Wants an rx sent to [facility]

## 2015-03-21 NOTE — Telephone Encounter (Signed)
Patient aware and med sent to pharmacy.  

## 2015-03-21 NOTE — Addendum Note (Signed)
Addended by: Denman George B on: 03/21/2015 03:36 PM   Modules accepted: Orders

## 2015-04-12 ENCOUNTER — Encounter: Payer: Self-pay | Admitting: Family Medicine

## 2015-04-12 ENCOUNTER — Ambulatory Visit (INDEPENDENT_AMBULATORY_CARE_PROVIDER_SITE_OTHER): Payer: 59 | Admitting: Family Medicine

## 2015-04-12 VITALS — BP 106/70 | HR 72 | Resp 18 | Ht 60.0 in | Wt 252.1 lb

## 2015-04-12 DIAGNOSIS — Z1159 Encounter for screening for other viral diseases: Secondary | ICD-10-CM

## 2015-04-12 DIAGNOSIS — E8881 Metabolic syndrome: Secondary | ICD-10-CM

## 2015-04-12 DIAGNOSIS — R51 Headache: Secondary | ICD-10-CM

## 2015-04-12 DIAGNOSIS — G4483 Primary cough headache: Secondary | ICD-10-CM

## 2015-04-12 DIAGNOSIS — J302 Other seasonal allergic rhinitis: Secondary | ICD-10-CM | POA: Diagnosis not present

## 2015-04-12 DIAGNOSIS — E1129 Type 2 diabetes mellitus with other diabetic kidney complication: Secondary | ICD-10-CM | POA: Diagnosis not present

## 2015-04-12 DIAGNOSIS — R05 Cough: Secondary | ICD-10-CM

## 2015-04-12 DIAGNOSIS — Z6841 Body Mass Index (BMI) 40.0 and over, adult: Secondary | ICD-10-CM

## 2015-04-12 DIAGNOSIS — Z114 Encounter for screening for human immunodeficiency virus [HIV]: Secondary | ICD-10-CM

## 2015-04-12 DIAGNOSIS — R519 Headache, unspecified: Secondary | ICD-10-CM

## 2015-04-12 DIAGNOSIS — I10 Essential (primary) hypertension: Secondary | ICD-10-CM

## 2015-04-12 DIAGNOSIS — R058 Other specified cough: Secondary | ICD-10-CM | POA: Insufficient documentation

## 2015-04-12 LAB — HEMOGLOBIN A1C
HEMOGLOBIN A1C: 7.5 % — AB (ref <5.7)
MEAN PLASMA GLUCOSE: 169 mg/dL — AB (ref <117)

## 2015-04-12 MED ORDER — KETOROLAC TROMETHAMINE 60 MG/2ML IM SOLN
60.0000 mg | Freq: Once | INTRAMUSCULAR | Status: AC
  Administered 2015-04-12: 60 mg via INTRAMUSCULAR

## 2015-04-12 MED ORDER — METHYLPREDNISOLONE ACETATE 80 MG/ML IJ SUSP
80.0000 mg | Freq: Once | INTRAMUSCULAR | Status: AC
  Administered 2015-04-12: 80 mg via INTRAMUSCULAR

## 2015-04-12 MED ORDER — PREDNISONE 5 MG (21) PO TBPK: 5.0000 mg | ORAL_TABLET | ORAL | Status: DC

## 2015-04-12 MED ORDER — ALBUTEROL SULFATE HFA 108 (90 BASE) MCG/ACT IN AERS: INHALATION_SPRAY | RESPIRATORY_TRACT | Status: DC

## 2015-04-12 MED ORDER — MOMETASONE FUROATE 50 MCG/ACT NA SUSP: 2.0000 | Freq: Every day | NASAL | Status: DC

## 2015-04-12 MED ORDER — MONTELUKAST SODIUM 10 MG PO TABS: 10.0000 mg | ORAL_TABLET | Freq: Every day | ORAL | Status: DC

## 2015-04-12 MED ORDER — PROMETHAZINE-DM 6.25-15 MG/5ML PO SYRP: ORAL_SOLUTION | ORAL | Status: DC

## 2015-04-12 MED ORDER — AZELASTINE HCL 0.1 % NA SOLN: 2.0000 | Freq: Two times a day (BID) | NASAL | Status: DC

## 2015-04-12 NOTE — Patient Instructions (Addendum)
F/u in 4 weeks, call in 10 days if no better with excess cough , I will refer you to lung specialist  Take daily allergy medications, 2 nasal sprays and one tablet    It is important that you exercise regularly at least 30 minutes 5 times a week. If you develop chest pain, have severe difficulty breathing, or feel very tired, stop exercising immediately and seek medical attention   The patient is asked to make an attempt to improve diet and exercise patterns to aid in medical management of this problem.  Labs today  Injections today for headache

## 2015-04-12 NOTE — Assessment & Plan Note (Addendum)
Slightly improved, no established exercise program yet and also diet needs to be modified Patient re-educated about  the importance of commitment to a  minimum of 150 minutes of exercise per week.  The importance of healthy food choices with portion control discussed. Encouraged to start a food diary, count calories and to consider  joining a support group. Sample diet sheets offered. Goals set by the patient for the next several months.   Weight /BMI 04/12/2015 03/08/2015 02/07/2015  WEIGHT 252 lb 1.9 oz 254 lb 253 lb 12.8 oz  HEIGHT 5\' 0"  5\' 0"  5\' 0"   BMI 49.24 kg/m2 49.61 kg/m2 49.57 kg/m2    Current exercise per week 30 minutes.  Has been feeling ill x 1 month

## 2015-04-13 ENCOUNTER — Encounter: Payer: Self-pay | Admitting: Family Medicine

## 2015-04-13 ENCOUNTER — Other Ambulatory Visit: Payer: Self-pay

## 2015-04-13 DIAGNOSIS — I10 Essential (primary) hypertension: Secondary | ICD-10-CM

## 2015-04-13 LAB — COMPLETE METABOLIC PANEL WITH GFR
ALT: 9 U/L (ref 6–29)
AST: 12 U/L (ref 10–35)
Albumin: 3.8 g/dL (ref 3.6–5.1)
Alkaline Phosphatase: 43 U/L (ref 33–130)
BUN: 15 mg/dL (ref 7–25)
CO2: 28 mmol/L (ref 20–31)
CREATININE: 1.25 mg/dL — AB (ref 0.50–1.05)
Calcium: 8.2 mg/dL — ABNORMAL LOW (ref 8.6–10.4)
Chloride: 99 mmol/L (ref 98–110)
GFR, Est African American: 58 mL/min — ABNORMAL LOW (ref >=60)
GFR, Est Non African American: 50 mL/min — ABNORMAL LOW (ref >=60)
Glucose, Bld: 100 mg/dL — ABNORMAL HIGH (ref 65–99)
POTASSIUM: 3.1 mmol/L — AB (ref 3.5–5.3)
Sodium: 138 mmol/L (ref 135–146)
Total Bilirubin: 0.4 mg/dL (ref 0.2–1.2)
Total Protein: 6.9 g/dL (ref 6.1–8.1)

## 2015-04-13 LAB — HIV ANTIBODY (ROUTINE TESTING W REFLEX): HIV: NONREACTIVE

## 2015-04-13 LAB — HEPATITIS C ANTIBODY: HCV Ab: NEGATIVE

## 2015-04-13 MED ORDER — GLIPIZIDE ER 2.5 MG PO TB24: 2.5000 mg | ORAL_TABLET | Freq: Every day | ORAL | Status: DC

## 2015-04-27 ENCOUNTER — Ambulatory Visit: Payer: BLUE CROSS/BLUE SHIELD | Admitting: Family Medicine

## 2015-04-28 ENCOUNTER — Other Ambulatory Visit: Payer: Self-pay | Admitting: Family Medicine

## 2015-05-07 LAB — BASIC METABOLIC PANEL WITH GFR
BUN: 23 mg/dL (ref 7–25)
CALCIUM: 8.3 mg/dL — AB (ref 8.6–10.4)
CO2: 30 mmol/L (ref 20–31)
Chloride: 102 mmol/L (ref 98–110)
Creat: 1.09 mg/dL — ABNORMAL HIGH (ref 0.50–1.05)
GFR, EST AFRICAN AMERICAN: 68 mL/min (ref >=60)
GFR, Est Non African American: 59 mL/min — ABNORMAL LOW (ref >=60)
Glucose, Bld: 114 mg/dL — ABNORMAL HIGH (ref 65–99)
Potassium: 3.7 mmol/L (ref 3.5–5.3)
SODIUM: 141 mmol/L (ref 135–146)

## 2015-05-09 ENCOUNTER — Encounter: Payer: Self-pay | Admitting: Family Medicine

## 2015-05-09 ENCOUNTER — Ambulatory Visit (INDEPENDENT_AMBULATORY_CARE_PROVIDER_SITE_OTHER): Payer: 59 | Admitting: Family Medicine

## 2015-05-09 VITALS — BP 118/74 | HR 78 | Resp 16 | Ht 60.0 in | Wt 249.0 lb

## 2015-05-09 DIAGNOSIS — E1129 Type 2 diabetes mellitus with other diabetic kidney complication: Secondary | ICD-10-CM

## 2015-05-09 DIAGNOSIS — R058 Other specified cough: Secondary | ICD-10-CM

## 2015-05-09 DIAGNOSIS — J302 Other seasonal allergic rhinitis: Secondary | ICD-10-CM

## 2015-05-09 DIAGNOSIS — M349 Systemic sclerosis, unspecified: Secondary | ICD-10-CM

## 2015-05-09 DIAGNOSIS — R519 Headache, unspecified: Secondary | ICD-10-CM | POA: Insufficient documentation

## 2015-05-09 DIAGNOSIS — E559 Vitamin D deficiency, unspecified: Secondary | ICD-10-CM | POA: Diagnosis not present

## 2015-05-09 DIAGNOSIS — I10 Essential (primary) hypertension: Secondary | ICD-10-CM

## 2015-05-09 DIAGNOSIS — Z23 Encounter for immunization: Secondary | ICD-10-CM

## 2015-05-09 DIAGNOSIS — R05 Cough: Secondary | ICD-10-CM

## 2015-05-09 DIAGNOSIS — Z6841 Body Mass Index (BMI) 40.0 and over, adult: Secondary | ICD-10-CM

## 2015-05-09 DIAGNOSIS — R51 Headache: Secondary | ICD-10-CM

## 2015-05-09 MED ORDER — FLUTICASONE PROPIONATE 50 MCG/ACT NA SUSP: 16.0000 | Freq: Every day | NASAL | Status: DC

## 2015-05-09 MED ORDER — MONTELUKAST SODIUM 10 MG PO TABS: 10.0000 mg | ORAL_TABLET | Freq: Every day | ORAL | Status: DC

## 2015-05-09 MED ORDER — ERGOCALCIFEROL 1.25 MG (50000 UT) PO CAPS: 50000.0000 [IU] | ORAL_CAPSULE | ORAL | Status: DC

## 2015-05-09 NOTE — Assessment & Plan Note (Addendum)
Improved. Patient re-educated about  the importance of commitment to a  minimum of 150 minutes of exercise per week.  The importance of healthy food choices with portion control discussed. Encouraged to start a food diary, count calories and to consider  joining a support group. Sample diet sheets offered. Goals set by the patient for the next several months. Will plan meals for entire week and consider healthy choice entrees for evening meals on days when she is late and too tired  To cook   Weight /BMI 05/09/2015 04/12/2015 03/08/2015  WEIGHT 249 lb 252 lb 1.9 oz 254 lb  HEIGHT 5\' 0"  5\' 0"  5\' 0"   BMI 48.63 kg/m2 49.24 kg/m2 49.61 kg/m2    Current exercise per week 45 minutes.

## 2015-05-09 NOTE — Progress Notes (Signed)
Subjective:    Patient ID: Sabrina [name], female    DOB: 1961/01/28, 54 y.o.   MRN: [id number]  HPI Here for  F/u of weight  Has committed to breakfast and lunch being disciplined  5 days per week Drinks only water except weekends when she has 2 glasses of sweet tea. Exercise startegy neeeeds to be worked on, but unable to do as much as planned since had cough  Has actually lost 3 pounds since last visit which is good  Three skin biopsies last week, wants to hold on flu vaccine but will take the TdAP   Review of Systems See HPI Denies recent fever or chills. Denies sinus pressure, nasal congestion, ear pain or sore throat. Denies chest congestion, productive cough or wheezing. Denies chest pains, palpitations and leg swelling Denies abdominal pain, nausea, vomiting,diarrhea or constipation.   Denies dysuria, frequency, hesitancy or incontinence. Denies joint pain, swelling and limitation in mobility. Denies headaches, seizures, numbness, or tingling. Denies depression, anxiety or insomnia. Denies skin break down or rash.    BP 118/74 mmHg  Pulse 78  Resp 16  Ht 5' (1.524 m)  Wt 249 lb (112.946 kg)  BMI 48.63 kg/m2  SpO2 97%  LMP 10/31/2002 Patient alert and oriented and in no cardiopulmonary distress.  HEENT: No facial asymmetry, EOMI,   oropharynx pink and moist.  Neck supple no JVD, no mass.  Chest: Clear to auscultation bilaterally.  CVS: S1, S2 no murmurs, no S3.Regular rate.  ABD: Soft non tender.   Ext: No edema  MS: Adequate ROM spine, shoulders, hips and knees.  Skin: Intact, no ulcerations or rash noted.  Psych: Good eye contact, normal affect. Memory intact not anxious or depressed appearing.  CNS: CN 2-12 intact, power,  normal throughout.no focal deficits noted.     Objective:   Physical Exam        Assessment & Plan:  Morbid obesity with BMI of 45.0-49.9, adult Improved. Patient re-educated about  the importance of commitment  to a  minimum of 150 minutes of exercise per week.  The importance of healthy food choices with portion control discussed. Encouraged to start a food diary, count calories and to consider  joining a support group. Sample diet sheets offered. Goals set by the patient for the next several months. Will plan meals for entire week and consider healthy choice entrees for evening meals on days when she is late and too tired  To cook   Weight /BMI 05/09/2015 04/12/2015 03/08/2015  WEIGHT 249 lb 252 lb 1.9 oz 254 lb  HEIGHT 5\' 0"  5\' 0"  5\' 0"   BMI 48.63 kg/m2 49.24 kg/m2 49.61 kg/m2    Current exercise per week 45 minutes.   Essential hypertension DASH diet and commitment to daily physical activity for a minimum of 30 minutes discussed and encouraged, as a part of hypertension management. The importance of attaining a healthy weight is also discussed.  BP/Weight 05/09/2015 04/12/2015 03/08/2015 02/07/2015 01/14/2015 01/05/2015 6/78/9381  Systolic BP 017 510 258 527 782 423 -  Diastolic BP 74 70 80 78 64 74 -  Wt. (Lbs) 249 252.12 254 253.8 248 250.12 251.8  BMI 48.63 49.24 49.61 49.57 48.43 48.85 49.18        Diabetes mellitus with renal manifestation Controlled, no change in medication`Needs to improve however. Ms. Makar is reminded of the importance of commitment to daily physical activity for 30 minutes or more, as able and the need to limit carbohydrate intake to 30  to 60 grams per meal to help with blood sugar control.   The need to take medication as prescribed, test blood sugar as directed, and to call between visits if there is a concern that blood sugar is uncontrolled is also discussed.   Ms. Aina is reminded of the importance of daily foot exam, annual eye examination, and good blood sugar, blood pressure and cholesterol control.  Diabetic Labs Latest Ref Rng 05/06/2015 04/12/2015 03/08/2015 01/01/2015 10/13/2014  HbA1c <5.7 % - 7.5(H) - 7.7(H) -  Microalbumin <2.0 mg/dL - - - - -    Micro/Creat Ratio 0.0 - 30.0 mg/g - - - - -  Chol 0 - 200 mg/dL - - - 122 -  HDL >=46 mg/dL - - - 34(L) -  Calc LDL 0 - 99 mg/dL - - - 62 -  Triglycerides <150 mg/dL - - - 129 -  Creatinine 0.50 - 1.05 mg/dL 1.09(H) 1.25(H) 1.19(H) 1.12(H) 1.47(H)   BP/Weight 05/09/2015 04/12/2015 03/08/2015 02/07/2015 01/14/2015 01/05/2015 6/76/7209  Systolic BP 470 962 836 629 476 546 -  Diastolic BP 74 70 80 78 64 74 -  Wt. (Lbs) 249 252.12 254 253.8 248 250.12 251.8  BMI 48.63 49.24 49.61 49.57 48.43 48.85 49.18   Foot/eye exam completion dates Latest Ref Rng 09/15/2014 09/09/2014  Eye Exam No Retinopathy No Retinopathy -  Foot exam Order - - -  Foot Form Completion - - Done         Allergic cough Needs to add daily nasal spray for further improvement, reports a rough previous 3 weeks  Need for Tdap vaccination After obtaining informed consent, the vaccine is  administered by [name].   Scleroderma Recently biopsied by new dermatologist, question as to whether she has scleroderma, discoid lupus or both, maintained on methotrexate currently and doing well

## 2015-05-09 NOTE — Assessment & Plan Note (Signed)
Needs to commit to daily use of appropriate medication, astelin and nasonex prescribed and singular also

## 2015-05-09 NOTE — Patient Instructions (Addendum)
F/u in 4 weeks for obesity if needed, if not cancel   F/u 2nd week in December  Need hBa1C, non fasting chem 7 and EGFR and Vit D for that visit  TdAP today   Commit to 30 mins physical activity daily for health  Re- Plan evening meals so that evening eating is controlled as well as you do for rest of day  Bed time starts no later than 10"45 , adequate  Rest is vItAL for health and weight management  CONGRATS on 3 pound weight loss   Try to fill singulair daily for allergies, if too expensive choses generic claritin or zyrtec which are both oTC  Please work on good  health habits so that your health will improve. 1. Commitment to daily physical activity for 30 to 60  minutes, if you are able to do this.  2. Commitment to wise food choices. Aim for half of your  food intake to be vegetable and fruit, one quarter starchy foods, and one quarter protein. Try to eat on a regular schedule  3 meals per day, snacking between meals should be limited to vegetables or fruits or small portions of nuts. 64 ounces of water per day is generally recommended, unless you have specific health conditions, like heart failure or kidney failure where you will need to limit fluid intake.  3. Commitment to sufficient and a  good quality of physical and mental rest daily, generally between 6 to 8 hours per day.  WITH PERSISTANCE AND PERSEVERANCE, THE IMPOSSIBLE , BECOMES THE NORM!  Thanks for choosing [facility], we consider it a privelige to serve you.

## 2015-05-09 NOTE — Assessment & Plan Note (Signed)
DASH diet and commitment to daily physical activity for a minimum of 30 minutes discussed and encouraged, as a part of hypertension management. The importance of attaining a healthy weight is also discussed.  BP/Weight 05/09/2015 04/12/2015 03/08/2015 02/07/2015 01/14/2015 01/05/2015 3/47/5830  Systolic BP 746 002 984 730 856 943 -  Diastolic BP 74 70 80 78 64 74 -  Wt. (Lbs) 249 252.12 254 253.8 248 250.12 251.8  BMI 48.63 49.24 49.61 49.57 48.43 48.85 49.18

## 2015-05-09 NOTE — Assessment & Plan Note (Addendum)
Control of allergies needed to suppress the cough. Singulair prescribed for daily use and cough suppressant syrup also for as needed use If no relief she will be referred to pulmonologist. She is to call back if needed

## 2015-05-10 DIAGNOSIS — R519 Headache, unspecified: Secondary | ICD-10-CM | POA: Insufficient documentation

## 2015-05-10 DIAGNOSIS — Z23 Encounter for immunization: Secondary | ICD-10-CM | POA: Insufficient documentation

## 2015-05-10 DIAGNOSIS — R51 Headache: Secondary | ICD-10-CM

## 2015-05-10 NOTE — Assessment & Plan Note (Signed)
Needs to add daily nasal spray for further improvement, reports a rough previous 3 weeks

## 2015-05-10 NOTE — Assessment & Plan Note (Signed)
The increased risk of cardiovascular disease associated with this diagnosis, and the need to consistently work on lifestyle to change this is discussed. Following  a  heart healthy diet ,commitment to 30 minutes of exercise at least 5 days per week, as well as control of blood sugar and cholesterol , and achieving a healthy weight are all the areas to be addressed .  

## 2015-05-10 NOTE — Progress Notes (Signed)
   Sabrina [name]     MRN: [id number]      DOB: Jun 19, 1961   HPI Sabrina [name] is here for follow up and re-evaluation of chronic medical conditions, specifically morbid obesity, she is in the process of getting approval for bariatric surgery for which she qualifies based on her chronic health conditions and her failure to lose weight over the years despite repeated efforts. C/o 2 week h/o excess cough worse in supine position, denies fever or chills, has headache due to lack of sleep and worsened by cough.  ROS Denies recent fever or chills. Denies sinus pressure,  Has increased nasal congestion, denies ear pain or sore throat.  Denies chest pains, palpitations and leg swelling Denies abdominal pain, nausea, vomiting,diarrhea or constipation.   Denies dysuria, frequency, hesitancy or incontinence. Denies joint pain, swelling and limitation in mobility.  Denies depression, anxiety or insomnia.  PE  BP 106/70 mmHg  Pulse 72  Resp 18  Ht 5' (1.524 m)  Wt 252 lb 1.9 oz (114.361 kg)  BMI 49.24 kg/m2  SpO2 94%  LMP 10/31/2002  Patient alert and oriented and in no cardiopulmonary distress.  HEENT: No facial asymmetry, EOMI,   oropharynx pink and moist.  Neck supple no JVD, no mass.Erythema and edema of nasal mucosa  Chest: Clear to auscultation bilaterally.No crackles or wheezes  CVS: S1, S2 no murmurs, no S3.Regular rate.  ABD: Soft non tender.   Ext: No edema  MS: Adequate ROM spine, shoulders, hips and knees.  Skin: Intact, no ulcerations or rash noted.  Psych: Good eye contact, normal affect. Memory intact not anxious or depressed appearing.  CNS: CN 2-12 intact, power,  normal throughout.no focal deficits noted.   Assessment & Plan   Morbid obesity with BMI of 45.0-49.9, adult Slightly improved, no established exercise program yet and also diet needs to be modified Patient re-educated about  the importance of commitment to a  minimum of 150 minutes of  exercise per week.  The importance of healthy food choices with portion control discussed. Encouraged to start a food diary, count calories and to consider  joining a support group. Sample diet sheets offered. Goals set by the patient for the next several months.   Weight /BMI 04/12/2015 03/08/2015 02/07/2015  WEIGHT 252 lb 1.9 oz 254 lb 253 lb 12.8 oz  HEIGHT 5\' 0"  5\' 0"  5\' 0"   BMI 49.24 kg/m2 49.61 kg/m2 49.57 kg/m2    Current exercise per week 30 minutes.  Has been feeling ill x 1 month   Seasonal allergies Needs to commit to daily use of appropriate medication, astelin and nasonex prescribed and singular also  Allergic cough Control of allergies needed to suppress the cough. Singulair prescribed for daily use and cough suppressant syrup also for as needed use If no relief she will be referred to pulmonologist. She is to call back if needed  Essential hypertension DASH diet and commitment to daily physical activity for a minimum of 30 minutes discussed and encouraged, as a part of hypertension management. The importance of attaining a healthy weight is also discussed.  BP/Weight 05/09/2015 04/12/2015 03/08/2015 02/07/2015 01/14/2015 01/05/2015 5/46/2703  Systolic BP 500 938 182 993 716 967 -  Diastolic BP 74 70 80 78 64 74 -  Wt. (Lbs) 249 252.12 254 253.8 248 250.12 251.8  BMI 48.63 49.24 49.61 49.57 48.43 48.85 49.18        Headache toradol administered at visit

## 2015-05-10 NOTE — Assessment & Plan Note (Signed)
DASH diet and commitment to daily physical activity for a minimum of 30 minutes discussed and encouraged, as a part of hypertension management. The importance of attaining a healthy weight is also discussed.  BP/Weight 05/09/2015 04/12/2015 03/08/2015 02/07/2015 01/14/2015 01/05/2015 1/51/7616  Systolic BP 073 710 626 948 546 270 -  Diastolic BP 74 70 80 78 64 74 -  Wt. (Lbs) 249 252.12 254 253.8 248 250.12 251.8  BMI 48.63 49.24 49.61 49.57 48.43 48.85 49.18

## 2015-05-10 NOTE — Assessment & Plan Note (Signed)
After obtaining informed consent, the vaccine is  administered by [name].  

## 2015-05-10 NOTE — Assessment & Plan Note (Signed)
toradol administered at visit

## 2015-05-10 NOTE — Assessment & Plan Note (Signed)
Recently biopsied by new dermatologist, question as to whether she has scleroderma, discoid lupus or both, maintained on methotrexate currently and doing well

## 2015-05-10 NOTE — Assessment & Plan Note (Signed)
Controlled, no change in medication`Needs to improve however. Sabrina [name] is reminded of the importance of commitment to daily physical activity for 30 minutes or more, as able and the need to limit carbohydrate intake to 30 to 60 grams per meal to help with blood sugar control.   The need to take medication as prescribed, test blood sugar as directed, and to call between visits if there is a concern that blood sugar is uncontrolled is also discussed.   Sabrina [name] is reminded of the importance of daily foot exam, annual eye examination, and good blood sugar, blood pressure and cholesterol control.  Diabetic Labs Latest Ref Rng 05/06/2015 04/12/2015 03/08/2015 01/01/2015 10/13/2014  HbA1c <5.7 % - 7.5(H) - 7.7(H) -  Microalbumin <2.0 mg/dL - - - - -  Micro/Creat Ratio 0.0 - 30.0 mg/g - - - - -  Chol 0 - 200 mg/dL - - - 122 -  HDL >=46 mg/dL - - - 34(L) -  Calc LDL 0 - 99 mg/dL - - - 62 -  Triglycerides <150 mg/dL - - - 129 -  Creatinine 0.50 - 1.05 mg/dL 1.09(H) 1.25(H) 1.19(H) 1.12(H) 1.47(H)   BP/Weight 05/09/2015 04/12/2015 03/08/2015 02/07/2015 01/14/2015 01/05/2015 8/41/6606  Systolic BP 301 601 093 235 573 220 -  Diastolic BP 74 70 80 78 64 74 -  Wt. (Lbs) 249 252.12 254 253.8 248 250.12 251.8  BMI 48.63 49.24 49.61 49.57 48.43 48.85 49.18   Foot/eye exam completion dates Latest Ref Rng 09/15/2014 09/09/2014  Eye Exam No Retinopathy No Retinopathy -  Foot exam Order - - -  Foot Form Completion - - Done

## 2015-06-13 ENCOUNTER — Encounter: Payer: Self-pay | Admitting: Family Medicine

## 2015-06-13 ENCOUNTER — Ambulatory Visit (INDEPENDENT_AMBULATORY_CARE_PROVIDER_SITE_OTHER): Payer: 59 | Admitting: Family Medicine

## 2015-06-13 VITALS — BP 128/80 | HR 100 | Resp 18 | Ht 60.0 in | Wt 254.0 lb

## 2015-06-13 DIAGNOSIS — E559 Vitamin D deficiency, unspecified: Secondary | ICD-10-CM

## 2015-06-13 DIAGNOSIS — E8881 Metabolic syndrome: Secondary | ICD-10-CM

## 2015-06-13 DIAGNOSIS — Z23 Encounter for immunization: Secondary | ICD-10-CM

## 2015-06-13 DIAGNOSIS — E1129 Type 2 diabetes mellitus with other diabetic kidney complication: Secondary | ICD-10-CM

## 2015-06-13 DIAGNOSIS — Z6841 Body Mass Index (BMI) 40.0 and over, adult: Secondary | ICD-10-CM

## 2015-06-13 DIAGNOSIS — I10 Essential (primary) hypertension: Secondary | ICD-10-CM

## 2015-06-13 NOTE — Progress Notes (Signed)
Subjective:    Patient ID: Sabrina [name], female    DOB: [birth year], 53 y.o.   MRN: [id number]  HPI   Sabrina [name]     MRN: [id number]      DOB: 05-08-1961   HPI Sabrina [name] is here for follow up and re-evaluation of chronic medical conditions, medication management and review of any available recent lab and radiology data.  Main reason for visit is to review lifestyle changes maintained , and monitor weight changes on her current program preparing her for bariatric surgery, tentatively set for November 2016 Preventive health is updated, specifically  Cancer screening and Immunization.   Questions or concerns regarding consultations or procedures which the PT has had in the interim are  addressed. The PT denies any adverse reactions to current medications since the last visit.  There are no new concerns.  There are no specific complaints   ROS Denies recent fever or chills. Denies sinus pressure, nasal congestion, ear pain or sore throat. Denies chest congestion, productive cough or wheezing. Denies chest pains, palpitations and leg swelling Denies abdominal pain, nausea, vomiting,diarrhea or constipation.   Denies dysuria, frequency, hesitancy or incontinence. Denies joint pain, swelling and limitation in mobility. Denies headaches, seizures, numbness, or tingling. Denies depression, anxiety or insomnia. [name]   PE  BP 128/80 mmHg  Pulse 100  Resp 18  Ht 5' (1.524 m)  Wt 254 lb (115.214 kg)  BMI 49.61 kg/m2  SpO2 96%  LMP 10/31/2002  Patient alert and oriented and in no cardiopulmonary distress.  HEENT: No facial asymmetry, EOMI,   oropharynx pink and moist.  Neck supple no JVD, no mass.  Chest: Clear to auscultation bilaterally.  CVS: S1, S2 no murmurs, no S3.Regular rate.  ABD: Soft non tender.   Ext: No edema  MS: Adequate ROM spine, shoulders, hips and knees.  Skin: Intact,  Psych: Good eye contact, normal affect. Memory intact not  anxious or depressed appearing.  CNS: CN 2-12 intact, power,  normal throughout.no focal deficits noted.   Assessment & Plan   Essential hypertension Controlled, no change in medication DASH diet and commitment to daily physical activity for a minimum of 30 minutes discussed and encouraged, as a part of hypertension management. The importance of attaining a healthy weight is also discussed.  BP/Weight 06/13/2015 05/09/2015 04/12/2015 03/08/2015 02/07/2015 01/14/2015 5/80/9983  Systolic BP 382 505 397 673 419 379 024  Diastolic BP 80 74 70 80 78 64 74  Wt. (Lbs) 254 249 252.12 254 253.8 248 250.12  BMI 49.61 48.63 49.24 49.61 49.57 48.43 48.85        Morbid obesity with BMI of 45.0-49.9, adult Deteriorated. Patient re-educated about  the importance of commitment to a  minimum of 150 minutes of exercise per week.  The importance of healthy food choices with portion control discussed. Encouraged to start a food diary, count calories and to consider  joining a support group. Sample diet sheets offered. Goals set by the patient for the next several months.   Weight /BMI 06/13/2015 05/09/2015 04/12/2015  WEIGHT 254 lb 249 lb 252 lb 1.9 oz  HEIGHT 5\' 0"  5\' 0"  5\' 0"   BMI 49.61 kg/m2 48.63 kg/m2 49.24 kg/m2    Current exercise per week  60 minutes.   Diabetes mellitus with renal manifestation Controlled, but could improve, needs to resume invokana Sabrina [name] is reminded of the importance of commitment to daily physical activity for 30 minutes or more, as able and the need  to limit carbohydrate intake to 30 to 60 grams per meal to help with blood sugar control.   The need to take medication as prescribed, test blood sugar as directed, and to call between visits if there is a concern that blood sugar is uncontrolled is also discussed.   Sabrina [name] is reminded of the importance of daily foot exam, annual eye examination, and good blood sugar, blood pressure and cholesterol  control.  Diabetic Labs Latest Ref Rng 05/06/2015 04/12/2015 03/08/2015 01/01/2015 10/13/2014  HbA1c <5.7 % - 7.5(H) - 7.7(H) -  Microalbumin <2.0 mg/dL - - - - -  Micro/Creat Ratio 0.0 - 30.0 mg/g - - - - -  Chol 0 - 200 mg/dL - - - 122 -  HDL >=46 mg/dL - - - 34(L) -  Calc LDL 0 - 99 mg/dL - - - 62 -  Triglycerides <150 mg/dL - - - 129 -  Creatinine 0.50 - 1.05 mg/dL 1.09(H) 1.25(H) 1.19(H) 1.12(H) 1.47(H)   BP/Weight 06/13/2015 05/09/2015 04/12/2015 03/08/2015 02/07/2015 01/14/2015 11/24/2393  Systolic BP 320 233 435 686 168 372 902  Diastolic BP 80 74 70 80 78 64 74  Wt. (Lbs) 254 249 252.12 254 253.8 248 250.12  BMI 49.61 48.63 49.24 49.61 49.57 48.43 48.85   Foot/eye exam completion dates Latest Ref Rng 09/15/2014 09/09/2014  Eye Exam No Retinopathy No Retinopathy -  Foot exam Order - - -  Foot Form Completion - - Done         Metabolic syndrome X The increased risk of cardiovascular disease associated with this diagnosis, and the need to consistently work on lifestyle to change this is discussed. Following  a  heart healthy diet ,commitment to 30 minutes of exercise at least 5 days per week, as well as control of blood sugar and cholesterol , and achieving a healthy weight are all the areas to be addressed .        Review of Systems     Objective:   Physical Exam        Assessment & Plan:

## 2015-06-13 NOTE — Assessment & Plan Note (Addendum)
Controlled, but could improve, needs to resume invokana [name] is reminded of the importance of commitment to daily physical activity for 30 minutes or more, as able and the need to limit carbohydrate intake to 30 to 60 grams per meal to help with blood sugar control.   The need to take medication as prescribed, test blood sugar as directed, and to call between visits if there is a concern that blood sugar is uncontrolled is also discussed.   [name] is reminded of the importance of daily foot exam, annual eye examination, and good blood sugar, blood pressure and cholesterol control.  Diabetic Labs Latest Ref Rng 05/06/2015 04/12/2015 03/08/2015 01/01/2015 10/13/2014  HbA1c <5.7 % - 7.5(H) - 7.7(H) -  Microalbumin <2.0 mg/dL - - - - -  Micro/Creat Ratio 0.0 - 30.0 mg/g - - - - -  Chol 0 - 200 mg/dL - - - 122 -  HDL >=46 mg/dL - - - 34(L) -  Calc LDL 0 - 99 mg/dL - - - 62 -  Triglycerides <150 mg/dL - - - 129 -  Creatinine 0.50 - 1.05 mg/dL 1.09(H) 1.25(H) 1.19(H) 1.12(H) 1.47(H)   BP/Weight 06/13/2015 05/09/2015 04/12/2015 03/08/2015 02/07/2015 01/14/2015 9/76/7341  Systolic BP 937 902 409 735 329 924 268  Diastolic BP 80 74 70 80 78 64 74  Wt. (Lbs) 254 249 252.12 254 253.8 248 250.12  BMI 49.61 48.63 49.24 49.61 49.57 48.43 48.85   Foot/eye exam completion dates Latest Ref Rng 09/15/2014 09/09/2014  Eye Exam No Retinopathy No Retinopathy -  Foot exam Order - - -  Foot Form Completion - - Done

## 2015-06-13 NOTE — Assessment & Plan Note (Signed)
Controlled, no change in medication DASH diet and commitment to daily physical activity for a minimum of 30 minutes discussed and encouraged, as a part of hypertension management. The importance of attaining a healthy weight is also discussed.  BP/Weight 06/13/2015 05/09/2015 04/12/2015 03/08/2015 02/07/2015 01/14/2015 1/41/0301  Systolic BP 314 388 875 797 282 060 156  Diastolic BP 80 74 70 80 78 64 74  Wt. (Lbs) 254 249 252.12 254 253.8 248 250.12  BMI 49.61 48.63 49.24 49.61 49.57 48.43 48.85

## 2015-06-13 NOTE — Assessment & Plan Note (Signed)
The increased risk of cardiovascular disease associated with this diagnosis, and the need to consistently work on lifestyle to change this is discussed. Following  a  heart healthy diet ,commitment to 30 minutes of exercise at least 5 days per week, as well as control of blood sugar and cholesterol , and achieving a healthy weight are all the areas to be addressed .  

## 2015-06-13 NOTE — Patient Instructions (Signed)
F/u 2nd week in jan, call if you need me sooner  ALL the best with upcoming surgery which you will benefit greatly from  HBA1C, vit D , chem 7 and EGFR for Jan appt  PLS resume invokana as before  Flu vac today

## 2015-06-13 NOTE — Assessment & Plan Note (Signed)
Deteriorated. Patient re-educated about  the importance of commitment to a  minimum of 150 minutes of exercise per week.  The importance of healthy food choices with portion control discussed. Encouraged to start a food diary, count calories and to consider  joining a support group. Sample diet sheets offered. Goals set by the patient for the next several months.   Weight /BMI 06/13/2015 05/09/2015 04/12/2015  WEIGHT 254 lb 249 lb 252 lb 1.9 oz  HEIGHT 5\' 0"  5\' 0"  5\' 0"   BMI 49.61 kg/m2 48.63 kg/m2 49.24 kg/m2    Current exercise per week  60 minutes.

## 2015-06-20 ENCOUNTER — Encounter: Payer: 59 | Attending: General Surgery

## 2015-06-20 VITALS — Ht 60.0 in | Wt 249.5 lb

## 2015-06-20 DIAGNOSIS — Z6841 Body Mass Index (BMI) 40.0 and over, adult: Secondary | ICD-10-CM | POA: Insufficient documentation

## 2015-06-20 DIAGNOSIS — Z713 Dietary counseling and surveillance: Secondary | ICD-10-CM | POA: Diagnosis not present

## 2015-06-20 NOTE — Progress Notes (Signed)
  Pre-Operative Nutrition Class:  Appt start time: 830   End time:  930.  Patient was seen on 06/20/15 for Pre-Operative Bariatric Surgery Education at [facility].   Surgery date: 07/05/15 Surgery type: Sleeve gastrectomy Start weight at [facility]: 251.5 lbs on 12/31/14 Weight today: 249.5 lbs  TANITA  BODY COMP RESULTS  06/20/15   BMI (kg/m^2) 48.7   Fat Mass (lbs) 135.5   Fat Free Mass (lbs) 114   Total Body Water (lbs) 83.5   Samples given per MNT protocol. Patient educated on appropriate usage: Premier protein shake (vanilla - qty 1) Lot #: [id number] Exp: 05/2016  Unjury protein powder (unflavored - qty 1) Lot #: [id number] Exp: 02/2016  Celebrate Vitamins Multivitamin (orange - qty 1) Lot #: [id number] Exp: 10/2016  Celebrate Vitamins Calcium Citrate (berry - qty 1) Lot #: [id number] Exp: 10/2016  PB2 (chocolate - qty 1) Lot #: [name] Exp: 06/25/15  The following the learning objectives were met by the patient during this course:  Identify Pre-Op Dietary Goals and will begin 2 weeks pre-operatively  Identify appropriate sources of fluids and proteins   State protein recommendations and appropriate sources pre and post-operatively  Identify Post-Operative Dietary Goals and will follow for 2 weeks post-operatively  Identify appropriate multivitamin and calcium sources  Describe the need for physical activity post-operatively and will follow MD recommendations  State when to call healthcare provider regarding medication questions or post-operative complications  Handouts given during class include:  Pre-Op Bariatric Surgery Diet Handout  Protein Shake Handout  Post-Op Bariatric Surgery Nutrition Handout  BEL[facility] Program Information Flyer  Support Group Information Flyer  [facility] Bariatric Supplements Price List  Follow-Up Plan: Patient will follow-up at [facility] 2 weeks post operatively for diet advancement per MD.

## 2015-06-21 ENCOUNTER — Ambulatory Visit: Payer: Self-pay | Admitting: General Surgery

## 2015-06-30 ENCOUNTER — Telehealth: Payer: Self-pay | Admitting: Family Medicine

## 2015-06-30 ENCOUNTER — Encounter (HOSPITAL_COMMUNITY): Payer: Self-pay

## 2015-06-30 ENCOUNTER — Encounter (HOSPITAL_COMMUNITY)
Admission: RE | Admit: 2015-06-30 | Discharge: 2015-06-30 | Disposition: A | Discharge status: Home / Self Care | Payer: 59 | Source: Ambulatory Visit | Attending: General Surgery | Admitting: General Surgery

## 2015-06-30 DIAGNOSIS — Z01812 Encounter for preprocedural laboratory examination: Secondary | ICD-10-CM | POA: Diagnosis not present

## 2015-06-30 HISTORY — DX: Personal history of other diseases of the digestive system: Z87.19

## 2015-06-30 HISTORY — DX: Unspecified asthma, uncomplicated: J45.909

## 2015-06-30 HISTORY — DX: Pneumonia, unspecified organism: J18.9

## 2015-06-30 HISTORY — DX: Tinnitus, unspecified ear: H93.19

## 2015-06-30 HISTORY — DX: Anemia, unspecified: D64.9

## 2015-06-30 HISTORY — DX: Gastro-esophageal reflux disease without esophagitis: K21.9

## 2015-06-30 HISTORY — DX: Dizziness and giddiness: R42

## 2015-06-30 LAB — CBC WITH DIFFERENTIAL/PLATELET
Basophils Absolute: 0 10*3/uL (ref 0.0–0.1)
Basophils Relative: 0 %
EOS ABS: 0.5 10*3/uL (ref 0.0–0.7)
EOS PCT: 4 %
HCT: 33.7 % — ABNORMAL LOW (ref 36.0–46.0)
HEMOGLOBIN: 10.9 g/dL — AB (ref 12.0–15.0)
LYMPHS ABS: 3.5 10*3/uL (ref 0.7–4.0)
LYMPHS PCT: 25 %
MCH: 27.9 pg (ref 26.0–34.0)
MCHC: 32.3 g/dL (ref 30.0–36.0)
MCV: 86.4 fL (ref 78.0–100.0)
MONOS PCT: 5 %
Monocytes Absolute: 0.7 10*3/uL (ref 0.1–1.0)
NEUTROS PCT: 66 %
Neutro Abs: 9.2 10*3/uL — ABNORMAL HIGH (ref 1.7–7.7)
Platelets: 384 10*3/uL (ref 150–400)
RBC: 3.9 MIL/uL (ref 3.87–5.11)
RDW: 15 % (ref 11.5–15.5)
WBC: 13.9 10*3/uL — AB (ref 4.0–10.5)

## 2015-06-30 LAB — COMPREHENSIVE METABOLIC PANEL
ALK PHOS: 48 U/L (ref 38–126)
ALT: 12 U/L — AB (ref 14–54)
ANION GAP: 11 (ref 5–15)
AST: 17 U/L (ref 15–41)
Albumin: 3.9 g/dL (ref 3.5–5.0)
BUN: 29 mg/dL — ABNORMAL HIGH (ref 6–20)
CALCIUM: 8.9 mg/dL (ref 8.9–10.3)
CO2: 26 mmol/L (ref 22–32)
CREATININE: 1.41 mg/dL — AB (ref 0.44–1.00)
Chloride: 101 mmol/L (ref 101–111)
GFR, EST AFRICAN AMERICAN: 49 mL/min — AB (ref >60)
GFR, EST NON AFRICAN AMERICAN: 42 mL/min — AB (ref >60)
Glucose, Bld: 94 mg/dL (ref 65–99)
Potassium: 3.3 mmol/L — ABNORMAL LOW (ref 3.5–5.1)
SODIUM: 138 mmol/L (ref 135–145)
TOTAL PROTEIN: 8.1 g/dL (ref 6.5–8.1)
Total Bilirubin: 0.4 mg/dL (ref 0.3–1.2)

## 2015-06-30 MED ORDER — MECLIZINE HCL 50 MG PO TABS: 25.0000 mg | ORAL_TABLET | Freq: Three times a day (TID) | ORAL | Status: DC | PRN

## 2015-06-30 MED ORDER — METFORMIN HCL 500 MG PO TABS: 1000.0000 mg | ORAL_TABLET | Freq: Two times a day (BID) | ORAL | Status: DC

## 2015-06-30 NOTE — Progress Notes (Signed)
CBCD results in epic per PAT visit 06/30/2015 sent to Dr Redmond Pulling

## 2015-06-30 NOTE — Progress Notes (Signed)
Your patient has screened at an elevated risk for Obstructive Sleep Apnea using the Stop-Bang Tool during a pre-surgical visit. Patient scored at high risk.  

## 2015-06-30 NOTE — Telephone Encounter (Signed)
May prescribe 500 mg tabs two twice daily, will likely be reducing dose with weight loss Send meclizine 25 mg every 8 hres as needed #30 only pls

## 2015-06-30 NOTE — Patient Instructions (Signed)
[place]  06/30/2015   Your procedure is scheduled on: Tuesday July 05, 2015   Report to [facility]  Entrance take [place]  elevators to 3rd floor to  [place] at 9:00 AM.  Call this number if you have problems the morning of surgery [phone]   Remember: ONLY 1 PERSON MAY GO WITH YOU TO SHORT STAY TO GET  READY MORNING OF [place].  Do not eat food or drink liquids :After Midnight.     Take these medicines the morning of surgery: May use flonase if needed; (bring with you day of surgery) DO NOT TAKE ANY DIABETIC MEDICATIONS DAY OF YOUR SURGERY                               You may not have any metal on your body including hair pins and              piercings  Do not wear jewelry, make-up, lotions, powders or perfumes, deodorant             Do not wear nail polish.  Do not shave  48 hours prior to surgery.               Do not bring valuables to the hospital. [place].  Contacts, dentures or bridgework may not be worn into surgery.  Leave suitcase in the car. After surgery it may be brought to your room.   _____________________________________________________________________             [facility] - Preparing for Surgery Before surgery, you can play an important role.  Because skin is not sterile, your skin needs to be as free of germs as possible.  You can reduce the number of germs on your skin by washing with CHG (chlorahexidine gluconate) soap before surgery.  CHG is an antiseptic cleaner which kills germs and bonds with the skin to continue killing germs even after washing. Please DO NOT use if you have an allergy to CHG or antibacterial soaps.  If your skin becomes reddened/irritated stop using the CHG and inform your nurse when you arrive at Short Stay. Do not shave (including legs and underarms) for at least 48 hours prior to the first CHG shower.  You may shave your  face/neck. Please follow these instructions carefully:  1.  Shower with CHG Soap the night before surgery and the  morning of Surgery.  2.  If you choose to wash your hair, wash your hair first as usual with your  normal  shampoo.  3.  After you shampoo, rinse your hair and body thoroughly to remove the  shampoo.                           4.  Use CHG as you would any other liquid soap.  You can apply chg directly  to the skin and wash                       Gently with a scrungie or clean washcloth.  5.  Apply the CHG Soap to your body ONLY FROM THE NECK DOWN.   Do not use on  face/ open                           Wound or open sores. Avoid contact with eyes, ears mouth and genitals (private parts).                       Wash face,  Genitals (private parts) with your normal soap.             6.  Wash thoroughly, paying special attention to the area where your surgery  will be performed.  7.  Thoroughly rinse your body with warm water from the neck down.  8.  DO NOT shower/wash with your normal soap after using and rinsing off  the CHG Soap.                9.  Pat yourself dry with a clean towel.            10.  Wear clean pajamas.            11.  Place clean sheets on your bed the night of your first shower and do not  sleep with pets. Day of Surgery : Do not apply any lotions/deodorants the morning of surgery.  Please wear clean clothes to the hospital/surgery center.  FAILURE TO FOLLOW THESE INSTRUCTIONS MAY RESULT IN THE CANCELLATION OF YOUR SURGERY PATIENT SIGNATURE_________________________________  NURSE SIGNATURE__________________________________  ________________________________________________________________________

## 2015-06-30 NOTE — Addendum Note (Signed)
Addended by: Denman George B on: 06/30/2015 09:47 AM   Modules accepted: Orders

## 2015-06-30 NOTE — Telephone Encounter (Signed)
Patient aware and medications sent to requested pharmacy.

## 2015-06-30 NOTE — Progress Notes (Signed)
Stress test / epic 01/28/2015 with clearance noted per Dr Tressia Miners Turner/cardiology CXR epic 10/11/2014 EKG epic 10/12/2014 ECHO epic 02/04/2015

## 2015-06-30 NOTE — Telephone Encounter (Signed)
Patient has symptoms of vertigo.  Can she be prescribed Meclizine?     She also states that she was advised that Metformin 1000mg  is too large of a pill to take after gastric bypass.  She is asking if this can be changed to another dose or to liquid form.  Please advise.

## 2015-06-30 NOTE — Telephone Encounter (Signed)
Patient is calling stating that she would like to be seen today but she wont be available until after 4:00 pm she states that she is supposed to have surgery Tuesday Nov 22nd but she is dizzy, feels like she is going to pass out, she states that the room feels like its spinning. She states that If we cant see her today after 4 refer her to someone who can tomorrow.

## 2015-07-01 NOTE — Progress Notes (Signed)
CMP results / epic per PAT visit 07/01/2015 sent to Dr Redmond Pulling

## 2015-07-04 ENCOUNTER — Ambulatory Visit: Payer: Self-pay | Admitting: General Surgery

## 2015-07-04 NOTE — H&P (Signed)
Sabrina [name] 07/04/2015 10:36 AM Location: [facility] Patient #: [id number] DOB: 08-Nov-1960 Married / Language: English / Race: Black or African American Female  History of Present Illness Sabrina [name] M. Sabrina Huebsch MD; 07/04/2015 10:56 AM) Patient words: bari preop.  The patient is a 54 year old female who presents for a pre-op visit. She comes in today for her preoperative visit. She has been scheduled and approved for laparoscopic sleeve gastrectomy with possible hiatal hernia repair. Her initial visit was in April. Her weight at that time was 253 pounds with a BMI 49.4. She is scheduled for surgery tomorrow. She missed her preoperative appointment this past Friday due to development of some vertigo. She did see her primary care physician and was given a prescription for meclizine. She states that her vertigo has resolved. Otherwise she denies any significant medical changes. Based on her bariatric surgery evaluation labs she was put on supplemental vitamin D and her oral diabetes medication was changed around.  Her upper GI showed a small hiatal hernia. Her labs in April showed a hemoglobin of 10 and hematocrit of 32 with an MCV of 85, her comprehensive metabolic panel was within normal limits. Her hemoglobin A1c at that time was 9.2. Her lipid panel was unremarkable. Her vitamin D level was low at 9.  She has stopped her methotrexate for the upcoming surgery.   Problem List/Past Medical Sabrina [name] Sabrina Derby, MD; 07/04/2015 10:57 AM) CHRONIC ASTHMA, UNSPECIFIED ASTHMA SEVERITY, UNCOMPLICATED (Q000111Q) TYPE 2 DIABETES MELLITUS WITH RENAL MANIFESTATIONS, CONTROLLED (E11.29) GASTROESOPHAGEAL REFLUX DISEASE, ESOPHAGITIS PRESENCE NOT SPECIFIED (K21.9) ESSENTIAL HYPERTENSION WITH GOAL BLOOD PRESSURE LESS THAN 130/80 (I10) H/O HYPERCHOLESTEROLEMIA (Z86.39) BODY MASS INDEX (BMI) OF 40.0 TO 49.9 (E66.01) MORPHEA SCLERODERMA (L94.0) ELEVATED SERUM CREATININE  (R79.89) VITAMIN D DEFICIENCY (E55.9) POSITIVE H. PYLORI TEST (A04.8) IRON DEFICIENCY ANEMIA, UNSPECIFIED IRON DEFICIENCY ANEMIA TYPE (D50.9)  Other Problems Sabrina Curry, MD; 07/04/2015 10:57 AM) Bladder Problems  Past Surgical History Sabrina Curry, MD; 07/04/2015 10:57 AM) Oral Surgery Mammoplasty; Reduction Bilateral. Hysterectomy (not due to cancer) - Partial Colon Polyp Removal - Colonoscopy  Diagnostic Studies History Sabrina Curry, MD; 07/04/2015 10:57 AM) Mammogram within last year Colonoscopy 1-5 years ago Pap Smear 1-5 years ago  Allergies (Sabrina Eversole, [name]; 579FGE 624THL AM) ACE Inhibitors Codeine Phosphate *ANALGESICS - OPIOID* Potassium Chlorate *CHEMICALS* Amlodipine Besy-Benazepril HCl *ANTIHYPERTENSIVES* Latex Exam Gloves *MEDICAL DEVICES AND SUPPLIES*  Medication History Sabrina Curry, MD; [id number] AM) Folic Acid (1MG  Tablet, Oral) Active. GlipiZIDE (5MG  Tablet, Oral) Active. Invokana (300MG  Tablet, Oral) Active. Klor-Con M20 [name] Tablet ER, Oral) Active. Methotrexate (2.5MG  Tablet, Oral) Active. Pravastatin Sodium (10MG  Tablet, Oral) Active. Triamterene-HCTZ (37.5-25MG  Tablet, Oral) Active. Vitamin D (Ergocalciferol) (50000UNIT Capsule, Oral) Active. Mupirocin Calcium (2% Cream, External) Active. Clindamycin Phosphate (1% Gel, External) Active. MetFORMIN HCl (500MG  Tablet, Oral) Active. Calcium (500MG  Tablet, Oral) Active. Multiple Vitamins (Oral) Active. Meclizine HCl (25MG  Tablet, Oral) Active. Medications Reconciled OxyCODONE HCl (5MG /5ML Solution, 5-10 Milliliter Oral every four hours, as needed, Taken starting 07/04/2015) Active. Ondansetron (4MG  Tablet Disperse, 1 (one) Tablet Oral every six hours, as needed, Taken starting 07/04/2015) Active.  Social History Sabrina Curry, MD; 07/04/2015 10:57 AM) Tobacco use Never smoker. Caffeine use Tea. No drug use No alcohol use  Family  History Sabrina Curry, MD; 07/04/2015 10:57 AM) Arthritis Father, Mother, Sister. Bleeding disorder Sister. Cerebrovascular Accident Father. Breast Cancer Sister. Thyroid problems Sister. Respiratory Condition Mother. Diabetes Mellitus Father, Mother. Depression Father, Mother, Sister. Heart Disease Father, Mother.  Prostate Cancer Father. Hypertension Father, Mother.  Pregnancy / Birth History Sabrina Curry, MD; 07/04/2015 10:57 AM) [name] 0 Age at menarche 20 years. Age of menopause 51-55 Irregular periods Gravida 0     Review of Systems Sabrina [name] M. Lawonda Pretlow MD; 07/04/2015 10:54 AM) General Present- Chills, Fatigue, Night Sweats and Weight Gain. Not Present- Appetite Loss, Fever and Weight Loss. Skin Present- Change in Wart/Mole, Dryness, Non-Healing Wounds and Rash. Not Present- Hives, Jaundice, New Lesions and Ulcer. HEENT Present- Seasonal Allergies, Sinus Pain, Visual Disturbances and Wears glasses/contact lenses. Not Present- Earache, Hearing Loss, Hoarseness, Nose Bleed, Oral Ulcers, Ringing in the Ears, Sore Throat and Yellow Eyes. Respiratory Present- Chronic Cough, Difficulty Breathing, Snoring and Wheezing. Not Present- Bloody sputum. Breast Not Present- Breast Mass, Breast Pain, Nipple Discharge and Skin Changes. Cardiovascular Present- Difficulty Breathing Lying Down, Leg Cramps, Rapid Heart Rate, Shortness of Breath and Swelling of Extremities. Not Present- Chest Pain and Palpitations. Gastrointestinal Present- Bloating, Constipation, Difficulty Swallowing and Indigestion. Not Present- Abdominal Pain, Bloody Stool, Change in Bowel Habits, Chronic diarrhea, Excessive gas, Gets full quickly at meals, Hemorrhoids, Nausea, Rectal Pain and Vomiting. Female Genitourinary Present- Nocturia and Urgency. Not Present- Frequency, Painful Urination and Pelvic Pain. Musculoskeletal Present- Joint Pain, Joint Stiffness and Muscle Pain. Not Present- Back Pain, Muscle  Weakness and Swelling of Extremities. Neurological Present- Trouble walking and Weakness. Not Present- Decreased Memory, Fainting, Headaches, Numbness, Seizures, Tingling and Tremor. Psychiatric Present- Anxiety and Fearful. Not Present- Bipolar, Change in Sleep Pattern, Depression and Frequent crying. Endocrine Present- Cold Intolerance, Hair Changes and Hot flashes. Not Present- Excessive Hunger, Heat Intolerance and New Diabetes. Hematology Present- Easy Bruising. Not Present- Excessive bleeding, Gland problems, HIV and Persistent Infections.  Vitals (Sabrina Eversole [name]; 579FGE 624THL AM) 07/04/2015 10:37 AM Weight: 248 lb Height: 60in Body Surface Area: 2.05 m Body Mass Index: 48.43 kg/m  Temp.: 98.46F(Oral)  Pulse: 88 (Regular)  Resp.: 12 (Unlabored)  BP: 124/78 (Sitting, Left Arm, Standard)      Physical Exam Sabrina [name] M. Tiffany Talarico MD; 07/04/2015 10:54 AM)  General Mental Status-Alert. General Appearance-Consistent with stated age. Hydration-Well hydrated. Voice-Normal. Note: MORBIDLY OBESE, CENTRAL TRUNCAL  Integumentary Note: SOME SKIN CONTRACTURES IN VARIOUS PLACES, BASE OF NECK, BACK; scatttered hyperpigmented skin patches  Head and Neck Head-normocephalic, atraumatic with no lesions or palpable masses. Trachea-midline. Thyroid Gland Characteristics - normal size and consistency.  Eye Eyeball - Bilateral-Extraocular movements intact. Sclera/Conjunctiva - Bilateral-No scleral icterus.  Chest and Lung Exam Chest and lung exam reveals -quiet, even and easy respiratory effort with no use of accessory muscles and on auscultation, normal breath sounds, no adventitious sounds and normal vocal resonance. Inspection Chest Wall - Normal. Back - normal.  Breast - Did not examine.  Cardiovascular Cardiovascular examination reveals -normal heart sounds, regular rate and rhythm with no murmurs and normal pedal pulses  bilaterally.  Abdomen Inspection Inspection of the abdomen reveals - No Hernias. Skin - Scar - Note: LOWER MIDLINE SCAR. Palpation/Percussion Palpation and Percussion of the abdomen reveal - Soft, Non Tender, No Rebound tenderness, No Rigidity (guarding) and No hepatosplenomegaly. Auscultation Auscultation of the abdomen reveals - Bowel sounds normal.  Peripheral Vascular Upper Extremity Palpation - Pulses bilaterally normal.  Neurologic Neurologic evaluation reveals -alert and oriented x 3 with no impairment of recent or remote memory. Mental Status-Normal.  Neuropsychiatric The patient's mood and affect are described as -normal. Judgment and Insight-insight is appropriate concerning matters relevant to self.  Musculoskeletal Normal Exam - Left-Upper Extremity Strength Normal and  Lower Extremity Strength Normal. Normal Exam - Right-Upper Extremity Strength Normal and Lower Extremity Strength Normal.  Lymphatic Head & Neck  General Head & Neck Lymphatics: Bilateral - Description - Normal. Axillary - Did not examine. Femoral & Inguinal - Did not examine.    Assessment & Plan Sabrina [name] M. Chesley Valls MD; 07/04/2015 10:57 AM)  BODY MASS INDEX (BMI) OF 40.0 TO 49.9 (E66.01) Impression: We reviewed her preoperative workup. We reviewed her upper GI which showed a small hiatal hernia. She does have some occasional reflux. We discussed what a hiatal hernia repair would involve. I explained that we would test for a hiatal hernia intraoperatively. If found to have a clinically significant one I recommended repair. We discussed the risk and benefits of that. We also rediscussed the typical postoperative course. She was given prescriptions for postoperative pain and nausea.  Current Plans Pt Education - EMW_preopbariatric Started OxyCODONE HCl 5MG /5ML, 5-10 Milliliter every four hours, as needed, 200 Milliliter, 07/04/2015, No Refill. Started Ondansetron 4MG , 1 (one) Tablet every  six hours, as needed, #20, 07/04/2015, No Refill. CHRONIC ASTHMA, UNSPECIFIED ASTHMA SEVERITY, UNCOMPLICATED (Q000111Q)  TYPE 2 DIABETES MELLITUS WITH RENAL MANIFESTATIONS, CONTROLLED (E11.29)  GASTROESOPHAGEAL REFLUX DISEASE, ESOPHAGITIS PRESENCE NOT SPECIFIED (K21.9) Impression: Per PCP, takes Prilosec when necessary  ESSENTIAL HYPERTENSION WITH GOAL BLOOD PRESSURE LESS THAN 130/80 (I10)  VITAMIN D DEFICIENCY (E55.9) Impression: Managed by PCP; currently taking vitamin D once a week  MORPHEA SCLERODERMA (L94.0) Impression: managed by [place], UNSPECIFIED IRON DEFICIENCY ANEMIA TYPE (D50.9) Impression: She has a long-standing history of anemia. We discussed the likelihood of needing supplemental iron postoperatively  Leighton Ruff. Redmond Pulling, MD, FACS General, Bariatric, & Minimally Invasive Surgery [facility], [place]

## 2015-07-05 ENCOUNTER — Inpatient Hospital Stay (HOSPITAL_COMMUNITY): Payer: 59 | Admitting: Registered Nurse

## 2015-07-05 ENCOUNTER — Inpatient Hospital Stay (HOSPITAL_COMMUNITY)
Admission: RE | Admit: 2015-07-05 | Discharge: 2015-07-07 | DRG: 621 | Disposition: A | Discharge status: Home / Self Care | Payer: 59 | Source: Ambulatory Visit | Attending: General Surgery | Admitting: General Surgery

## 2015-07-05 ENCOUNTER — Encounter (HOSPITAL_COMMUNITY): Payer: Self-pay | Admitting: Registered Nurse

## 2015-07-05 ENCOUNTER — Encounter (HOSPITAL_COMMUNITY)
Admission: RE | Disposition: A | Discharge status: Home / Self Care | Payer: Self-pay | Source: Ambulatory Visit | Attending: General Surgery

## 2015-07-05 DIAGNOSIS — Z79899 Other long term (current) drug therapy: Secondary | ICD-10-CM

## 2015-07-05 DIAGNOSIS — Z01812 Encounter for preprocedural laboratory examination: Secondary | ICD-10-CM | POA: Diagnosis not present

## 2015-07-05 DIAGNOSIS — Z6841 Body Mass Index (BMI) 40.0 and over, adult: Secondary | ICD-10-CM

## 2015-07-05 DIAGNOSIS — Z8249 Family history of ischemic heart disease and other diseases of the circulatory system: Secondary | ICD-10-CM | POA: Diagnosis not present

## 2015-07-05 DIAGNOSIS — E559 Vitamin D deficiency, unspecified: Secondary | ICD-10-CM | POA: Diagnosis present

## 2015-07-05 DIAGNOSIS — I10 Essential (primary) hypertension: Secondary | ICD-10-CM | POA: Diagnosis present

## 2015-07-05 DIAGNOSIS — E1129 Type 2 diabetes mellitus with other diabetic kidney complication: Secondary | ICD-10-CM | POA: Diagnosis present

## 2015-07-05 DIAGNOSIS — E7800 Pure hypercholesterolemia, unspecified: Secondary | ICD-10-CM | POA: Diagnosis present

## 2015-07-05 DIAGNOSIS — M349 Systemic sclerosis, unspecified: Secondary | ICD-10-CM | POA: Diagnosis present

## 2015-07-05 DIAGNOSIS — K219 Gastro-esophageal reflux disease without esophagitis: Secondary | ICD-10-CM | POA: Diagnosis present

## 2015-07-05 DIAGNOSIS — Z9884 Bariatric surgery status: Secondary | ICD-10-CM

## 2015-07-05 DIAGNOSIS — D509 Iron deficiency anemia, unspecified: Secondary | ICD-10-CM | POA: Diagnosis present

## 2015-07-05 DIAGNOSIS — K449 Diaphragmatic hernia without obstruction or gangrene: Secondary | ICD-10-CM | POA: Diagnosis present

## 2015-07-05 DIAGNOSIS — K760 Fatty (change of) liver, not elsewhere classified: Secondary | ICD-10-CM | POA: Diagnosis present

## 2015-07-05 DIAGNOSIS — E8881 Metabolic syndrome: Secondary | ICD-10-CM | POA: Diagnosis present

## 2015-07-05 DIAGNOSIS — E669 Obesity, unspecified: Secondary | ICD-10-CM | POA: Diagnosis present

## 2015-07-05 DIAGNOSIS — Z8639 Personal history of other endocrine, nutritional and metabolic disease: Secondary | ICD-10-CM

## 2015-07-05 DIAGNOSIS — R110 Nausea: Secondary | ICD-10-CM

## 2015-07-05 DIAGNOSIS — J45909 Unspecified asthma, uncomplicated: Secondary | ICD-10-CM | POA: Diagnosis present

## 2015-07-05 DIAGNOSIS — Z833 Family history of diabetes mellitus: Secondary | ICD-10-CM

## 2015-07-05 DIAGNOSIS — K210 Gastro-esophageal reflux disease with esophagitis: Secondary | ICD-10-CM | POA: Diagnosis present

## 2015-07-05 DIAGNOSIS — B9681 Helicobacter pylori [H. pylori] as the cause of diseases classified elsewhere: Secondary | ICD-10-CM | POA: Diagnosis present

## 2015-07-05 DIAGNOSIS — E1169 Type 2 diabetes mellitus with other specified complication: Secondary | ICD-10-CM | POA: Diagnosis present

## 2015-07-05 HISTORY — PX: LAPAROSCOPIC GASTRIC SLEEVE RESECTION WITH HIATAL HERNIA REPAIR: SHX6512

## 2015-07-05 HISTORY — DX: Bariatric surgery status: Z98.84

## 2015-07-05 LAB — GLUCOSE, CAPILLARY
GLUCOSE-CAPILLARY: 107 mg/dL — AB (ref 65–99)
GLUCOSE-CAPILLARY: 149 mg/dL — AB (ref 65–99)
Glucose-Capillary: 141 mg/dL — ABNORMAL HIGH (ref 65–99)
Glucose-Capillary: 159 mg/dL — ABNORMAL HIGH (ref 65–99)
Glucose-Capillary: 152 mg/dL — ABNORMAL HIGH (ref 65–99)

## 2015-07-05 LAB — BASIC METABOLIC PANEL
Anion gap: 14 (ref 5–15)
BUN: 26 mg/dL — AB (ref 6–20)
CALCIUM: 8.8 mg/dL — AB (ref 8.9–10.3)
CO2: 21 mmol/L — ABNORMAL LOW (ref 22–32)
CREATININE: 1.44 mg/dL — AB (ref 0.44–1.00)
Chloride: 103 mmol/L (ref 101–111)
GFR calc Af Amer: 48 mL/min — ABNORMAL LOW (ref >60)
GFR, EST NON AFRICAN AMERICAN: 41 mL/min — AB (ref >60)
Glucose, Bld: 188 mg/dL — ABNORMAL HIGH (ref 65–99)
POTASSIUM: 4.2 mmol/L (ref 3.5–5.1)
SODIUM: 138 mmol/L (ref 135–145)

## 2015-07-05 LAB — HEMOGLOBIN AND HEMATOCRIT, BLOOD
HEMATOCRIT: 35.2 % — AB (ref 36.0–46.0)
Hemoglobin: 11.8 g/dL — ABNORMAL LOW (ref 12.0–15.0)

## 2015-07-05 SURGERY — GASTRECTOMY, SLEEVE, LAPAROSCOPIC, WITH HIATAL HERNIA REPAIR
Anesthesia: General

## 2015-07-05 MED ORDER — PREMIER PROTEIN SHAKE
2.0000 [oz_av] | Freq: Four times a day (QID) | ORAL | Status: DC
  Administered 2015-07-07 (×2): 2 [oz_av] via ORAL

## 2015-07-05 MED ORDER — ONDANSETRON HCL 4 MG/2ML IJ SOLN: 4.0000 mg | Freq: Once | INTRAMUSCULAR | Status: DC | PRN

## 2015-07-05 MED ORDER — LIDOCAINE HCL (CARDIAC) 20 MG/ML IV SOLN
INTRAVENOUS | Status: DC | PRN
  Administered 2015-07-05: 40 mg via INTRAVENOUS

## 2015-07-05 MED ORDER — METOCLOPRAMIDE HCL 5 MG/ML IJ SOLN
INTRAMUSCULAR | Status: DC | PRN
Start: 2015-07-05 — End: 2015-07-05
  Administered 2015-07-05: 10 mg via INTRAVENOUS

## 2015-07-05 MED ORDER — PHENYLEPHRINE HCL 10 MG/ML IJ SOLN
INTRAMUSCULAR | Status: DC | PRN
  Administered 2015-07-05: 120 ug via INTRAVENOUS

## 2015-07-05 MED ORDER — GLYCOPYRROLATE 0.2 MG/ML IJ SOLN
INTRAMUSCULAR | Status: DC | PRN
  Administered 2015-07-05: 0.2 mg via INTRAVENOUS

## 2015-07-05 MED ORDER — 0.9 % SODIUM CHLORIDE (POUR BTL) OPTIME
TOPICAL | Status: DC | PRN
  Administered 2015-07-05: 1000 mL

## 2015-07-05 MED ORDER — BUPIVACAINE LIPOSOME 1.3 % IJ SUSP
20.0000 mL | Freq: Once | INTRAMUSCULAR | Status: AC
  Administered 2015-07-05: 20 mL
  Filled 2015-07-05: qty 20

## 2015-07-05 MED ORDER — LABETALOL HCL 5 MG/ML IV SOLN
INTRAVENOUS | Status: DC | PRN
  Administered 2015-07-05 (×3): 2.5 mg via INTRAVENOUS

## 2015-07-05 MED ORDER — HYDROMORPHONE HCL 1 MG/ML IJ SOLN
INTRAMUSCULAR | Status: DC | PRN
  Administered 2015-07-05: 0.5 mg via INTRAVENOUS

## 2015-07-05 MED ORDER — PROMETHAZINE HCL 25 MG/ML IJ SOLN: 12.5000 mg | Freq: Four times a day (QID) | INTRAMUSCULAR | Status: DC | PRN

## 2015-07-05 MED ORDER — SODIUM CHLORIDE 0.9 % IJ SOLN
INTRAMUSCULAR | Status: AC
  Filled 2015-07-05: qty 10

## 2015-07-05 MED ORDER — ONDANSETRON HCL 4 MG/2ML IJ SOLN
4.0000 mg | INTRAMUSCULAR | Status: DC | PRN
  Administered 2015-07-06 (×2): 4 mg via INTRAVENOUS
  Filled 2015-07-05 (×2): qty 2

## 2015-07-05 MED ORDER — ROCURONIUM BROMIDE 100 MG/10ML IV SOLN
INTRAVENOUS | Status: DC | PRN
  Administered 2015-07-05: 20 mg via INTRAVENOUS
  Administered 2015-07-05: 70 mg via INTRAVENOUS

## 2015-07-05 MED ORDER — MEPERIDINE HCL 50 MG/ML IJ SOLN: 6.2500 mg | INTRAMUSCULAR | Status: DC | PRN

## 2015-07-05 MED ORDER — FLUTICASONE PROPIONATE 50 MCG/ACT NA SUSP
1.0000 | Freq: Every day | NASAL | Status: DC | PRN
  Filled 2015-07-05: qty 16

## 2015-07-05 MED ORDER — CHLORHEXIDINE GLUCONATE 4 % EX LIQD: 60.0000 mL | Freq: Once | CUTANEOUS | Status: DC

## 2015-07-05 MED ORDER — OXYCODONE HCL 5 MG/5ML PO SOLN: 5.0000 mg | ORAL | Status: DC | PRN

## 2015-07-05 MED ORDER — PANTOPRAZOLE SODIUM 40 MG IV SOLR
40.0000 mg | Freq: Every day | INTRAVENOUS | Status: DC
  Administered 2015-07-05 – 2015-07-06 (×2): 40 mg via INTRAVENOUS
  Filled 2015-07-05 (×3): qty 40

## 2015-07-05 MED ORDER — MORPHINE SULFATE (PF) 2 MG/ML IV SOLN
2.0000 mg | INTRAVENOUS | Status: DC | PRN
  Administered 2015-07-05 – 2015-07-06 (×2): 2 mg via INTRAVENOUS
  Administered 2015-07-06: 4 mg via INTRAVENOUS
  Filled 2015-07-05: qty 2
  Filled 2015-07-05: qty 1
  Filled 2015-07-05 (×2): qty 2

## 2015-07-05 MED ORDER — MIDAZOLAM HCL 2 MG/2ML IJ SOLN
INTRAMUSCULAR | Status: AC
  Filled 2015-07-05: qty 2

## 2015-07-05 MED ORDER — SODIUM CHLORIDE 0.9 % IJ SOLN
INTRAMUSCULAR | Status: DC | PRN
  Administered 2015-07-05: 50 mL

## 2015-07-05 MED ORDER — ACETAMINOPHEN 160 MG/5ML PO SOLN: 650.0000 mg | ORAL | Status: DC | PRN

## 2015-07-05 MED ORDER — LACTATED RINGERS IV SOLN
INTRAVENOUS | Status: DC | PRN
  Administered 2015-07-05: 1000 mL
  Administered 2015-07-05 (×3): via INTRAVENOUS

## 2015-07-05 MED ORDER — ENOXAPARIN SODIUM 30 MG/0.3ML ~~LOC~~ SOLN
30.0000 mg | Freq: Two times a day (BID) | SUBCUTANEOUS | Status: DC
  Administered 2015-07-06 – 2015-07-07 (×3): 30 mg via SUBCUTANEOUS
  Filled 2015-07-05 (×5): qty 0.3

## 2015-07-05 MED ORDER — DIPHENHYDRAMINE HCL 50 MG/ML IJ SOLN: 12.5000 mg | Freq: Three times a day (TID) | INTRAMUSCULAR | Status: DC | PRN

## 2015-07-05 MED ORDER — SUFENTANIL CITRATE 50 MCG/ML IV SOLN
INTRAVENOUS | Status: DC | PRN
Start: 2015-07-05 — End: 2015-07-05
  Administered 2015-07-05: 30 ug via INTRAVENOUS
  Administered 2015-07-05 (×3): 10 ug via INTRAVENOUS

## 2015-07-05 MED ORDER — PROPOFOL 10 MG/ML IV BOLUS
INTRAVENOUS | Status: DC | PRN
  Administered 2015-07-05: 130 mg via INTRAVENOUS
  Administered 2015-07-05: 50 mg via INTRAVENOUS

## 2015-07-05 MED ORDER — ACETAMINOPHEN 10 MG/ML IV SOLN
INTRAVENOUS | Status: AC
  Filled 2015-07-05: qty 100

## 2015-07-05 MED ORDER — CEFOTETAN DISODIUM-DEXTROSE 2-2.08 GM-% IV SOLR
INTRAVENOUS | Status: AC
  Filled 2015-07-05: qty 50

## 2015-07-05 MED ORDER — GLYCOPYRROLATE 0.2 MG/ML IJ SOLN
INTRAMUSCULAR | Status: AC
  Filled 2015-07-05: qty 1

## 2015-07-05 MED ORDER — HYDROMORPHONE HCL 2 MG/ML IJ SOLN
INTRAMUSCULAR | Status: AC
  Filled 2015-07-05: qty 1

## 2015-07-05 MED ORDER — HYDROMORPHONE HCL 1 MG/ML IJ SOLN: 0.2500 mg | INTRAMUSCULAR | Status: DC | PRN

## 2015-07-05 MED ORDER — LIDOCAINE HCL (CARDIAC) 20 MG/ML IV SOLN
INTRAVENOUS | Status: AC
  Filled 2015-07-05: qty 5

## 2015-07-05 MED ORDER — INSULIN ASPART 100 UNIT/ML ~~LOC~~ SOLN
0.0000 [IU] | SUBCUTANEOUS | Status: DC
  Administered 2015-07-05: 4 [IU] via SUBCUTANEOUS
  Administered 2015-07-05: 3 [IU] via SUBCUTANEOUS
  Administered 2015-07-05 – 2015-07-06 (×2): 4 [IU] via SUBCUTANEOUS

## 2015-07-05 MED ORDER — ONDANSETRON HCL 4 MG/2ML IJ SOLN
INTRAMUSCULAR | Status: AC
  Filled 2015-07-05: qty 2

## 2015-07-05 MED ORDER — HEPARIN SODIUM (PORCINE) 5000 UNIT/ML IJ SOLN
5000.0000 [IU] | INTRAMUSCULAR | Status: AC
  Administered 2015-07-05: 5000 [IU] via SUBCUTANEOUS
  Filled 2015-07-05: qty 1

## 2015-07-05 MED ORDER — PROPOFOL 10 MG/ML IV BOLUS
INTRAVENOUS | Status: AC
  Filled 2015-07-05: qty 20

## 2015-07-05 MED ORDER — ACETAMINOPHEN 160 MG/5ML PO SOLN: 325.0000 mg | ORAL | Status: DC | PRN

## 2015-07-05 MED ORDER — SUFENTANIL CITRATE 50 MCG/ML IV SOLN
INTRAVENOUS | Status: AC
  Filled 2015-07-05: qty 1

## 2015-07-05 MED ORDER — POTASSIUM CHLORIDE IN NACL 20-0.45 MEQ/L-% IV SOLN
INTRAVENOUS | Status: DC
  Administered 2015-07-05: 16:00:00 via INTRAVENOUS
  Administered 2015-07-05: 125 mL/h via INTRAVENOUS
  Administered 2015-07-06 (×2): via INTRAVENOUS
  Filled 2015-07-05 (×7): qty 1000

## 2015-07-05 MED ORDER — SUGAMMADEX SODIUM 500 MG/5ML IV SOLN
INTRAVENOUS | Status: AC
  Filled 2015-07-05: qty 5

## 2015-07-05 MED ORDER — METOCLOPRAMIDE HCL 5 MG/ML IJ SOLN: 10.0000 mg | Freq: Three times a day (TID) | INTRAMUSCULAR | Status: DC | PRN

## 2015-07-05 MED ORDER — LACTATED RINGERS IR SOLN
Status: DC | PRN
  Administered 2015-07-05: 1

## 2015-07-05 MED ORDER — DEXAMETHASONE SODIUM PHOSPHATE 10 MG/ML IJ SOLN
INTRAMUSCULAR | Status: AC
  Filled 2015-07-05: qty 1

## 2015-07-05 MED ORDER — METHOCARBAMOL 1000 MG/10ML IJ SOLN
500.0000 mg | Freq: Three times a day (TID) | INTRAVENOUS | Status: DC | PRN
  Administered 2015-07-06: 500 mg via INTRAVENOUS
  Filled 2015-07-05 (×2): qty 5

## 2015-07-05 MED ORDER — ROCURONIUM BROMIDE 100 MG/10ML IV SOLN
INTRAVENOUS | Status: AC
  Filled 2015-07-05: qty 1

## 2015-07-05 MED ORDER — CEFOTETAN DISODIUM-DEXTROSE 2-2.08 GM-% IV SOLR
2.0000 g | INTRAVENOUS | Status: AC
  Administered 2015-07-05: 2 g via INTRAVENOUS

## 2015-07-05 MED ORDER — MIDAZOLAM HCL 5 MG/5ML IJ SOLN
INTRAMUSCULAR | Status: DC | PRN
  Administered 2015-07-05: 1 mg via INTRAVENOUS

## 2015-07-05 MED ORDER — SODIUM CHLORIDE 0.9 % IJ SOLN
INTRAMUSCULAR | Status: AC
  Filled 2015-07-05: qty 50

## 2015-07-05 MED ORDER — ONDANSETRON HCL 4 MG/2ML IJ SOLN
INTRAMUSCULAR | Status: DC | PRN
  Administered 2015-07-05: 4 mg via INTRAVENOUS

## 2015-07-05 MED ORDER — STERILE WATER FOR IRRIGATION IR SOLN
Status: DC | PRN
  Administered 2015-07-05: 1000 mL

## 2015-07-05 MED ORDER — LABETALOL HCL 5 MG/ML IV SOLN
INTRAVENOUS | Status: AC
Start: 2015-07-05 — End: 2015-07-05
  Filled 2015-07-05: qty 4

## 2015-07-05 MED ORDER — DEXAMETHASONE SODIUM PHOSPHATE 10 MG/ML IJ SOLN
INTRAMUSCULAR | Status: DC | PRN
  Administered 2015-07-05: 10 mg via INTRAVENOUS

## 2015-07-05 MED ORDER — ACETAMINOPHEN 10 MG/ML IV SOLN
INTRAVENOUS | Status: DC | PRN
  Administered 2015-07-05: 1000 mg via INTRAVENOUS

## 2015-07-05 SURGICAL SUPPLY — 72 items
ADH SKN CLS APL DERMABOND .7 (GAUZE/BANDAGES/DRESSINGS) ×1
APL SRG 32X5 SNPLK LF DISP (MISCELLANEOUS)
APPLICATOR COTTON TIP 6IN STRL (MISCELLANEOUS) IMPLANT
APPLIER CLIP ROT 10 11.4 M/L (STAPLE)
APR CLP MED LRG 11.4X10 (STAPLE)
BLADE SURG SZ11 CARB STEEL (BLADE) ×2 IMPLANT
CABLE HIGH FREQUENCY MONO STRZ (ELECTRODE) ×1 IMPLANT
CHLORAPREP W/TINT 26ML (MISCELLANEOUS) ×4 IMPLANT
CLIP APPLIE ROT 10 11.4 M/L (STAPLE) IMPLANT
COVER SURGICAL LIGHT HANDLE (MISCELLANEOUS) ×1 IMPLANT
DERMABOND ADVANCED (GAUZE/BANDAGES/DRESSINGS) ×1
DERMABOND ADVANCED .7 DNX12 (GAUZE/BANDAGES/DRESSINGS) ×1 IMPLANT
DEVICE PMI PUNCTURE CLOSURE (MISCELLANEOUS) ×1 IMPLANT
DEVICE SUT QUICK LOAD TK 5 (STAPLE) ×1 IMPLANT
DEVICE SUT TI-KNOT TK 5X26 (MISCELLANEOUS) ×1 IMPLANT
DEVICE SUTURE ENDOST 10MM (ENDOMECHANICALS) IMPLANT
DRAPE CAMERA CLOSED 9X96 (DRAPES) ×2 IMPLANT
DRAPE UTILITY XL STRL (DRAPES) ×4 IMPLANT
ELECT REM PT RETURN 9FT ADLT (ELECTROSURGICAL) ×2
ELECTRODE REM PT RTRN 9FT ADLT (ELECTROSURGICAL) ×1 IMPLANT
GAUZE SPONGE 4X4 12PLY STRL (GAUZE/BANDAGES/DRESSINGS) IMPLANT
GLOVE BIOGEL M STRL SZ7.5 (GLOVE) ×1 IMPLANT
GOWN STRL REUS W/TWL XL LVL3 (GOWN DISPOSABLE) ×7 IMPLANT
HOVERMATT SINGLE USE (MISCELLANEOUS) ×2 IMPLANT
KIT BASIN OR (CUSTOM PROCEDURE TRAY) ×2 IMPLANT
NDL SPNL 22GX3.5 QUINCKE BK (NEEDLE) ×1 IMPLANT
NEEDLE SPNL 22GX3.5 QUINCKE BK (NEEDLE) ×2 IMPLANT
PACK UNIVERSAL I (CUSTOM PROCEDURE TRAY) ×2 IMPLANT
PEN SKIN MARKING BROAD (MISCELLANEOUS) ×2 IMPLANT
RELOAD STAPLE 60 3.6 BLU REG (STAPLE) IMPLANT
RELOAD STAPLE 60 3.8 GOLD REG (STAPLE) IMPLANT
RELOAD STAPLE 60 4.1 GRN THCK (STAPLE) IMPLANT
RELOAD STAPLE 60 BLK VRY/THCK (STAPLE) IMPLANT
RELOAD STAPLER 60MM BLK (STAPLE) ×2 IMPLANT
RELOAD STAPLER BLUE 60MM (STAPLE) ×1 IMPLANT
RELOAD STAPLER GOLD 60MM (STAPLE) ×1 IMPLANT
RELOAD STAPLER GREEN 60MM (STAPLE) ×1 IMPLANT
RETRACT II ENDO 10MM 32CML (ENDOMECHANICALS) ×2
RETRACTOR II ENDO 10MM 32CML (ENDOMECHANICALS) IMPLANT
SCISSORS LAP 5X45 EPIX DISP (ENDOMECHANICALS) ×2 IMPLANT
SEALANT SURGICAL APPL DUAL CAN (MISCELLANEOUS) IMPLANT
SET IRRIG TUBING LAPAROSCOPIC (IRRIGATION / IRRIGATOR) ×2 IMPLANT
SHEARS HARMONIC ACE PLUS 45CM (MISCELLANEOUS) ×2 IMPLANT
SLEEVE ADV FIXATION 5X100MM (TROCAR) ×4 IMPLANT
SLEEVE GASTRECTOMY 36FR VISIGI (MISCELLANEOUS) ×2 IMPLANT
SLEEVE XCEL OPT CAN 5 100 (ENDOMECHANICALS) IMPLANT
SOLUTION ANTI FOG 6CC (MISCELLANEOUS) ×2 IMPLANT
SPONGE LAP 18X18 X RAY DECT (DISPOSABLE) ×2 IMPLANT
STAPLER ECHELON BIOABSB 60 FLE (MISCELLANEOUS) ×7 IMPLANT
STAPLER ECHELON LONG 60 440 (INSTRUMENTS) IMPLANT
STAPLER RELOAD 60MM BLK (STAPLE) ×4
STAPLER RELOAD BLUE 60MM (STAPLE) ×2
STAPLER RELOAD GOLD 60MM (STAPLE) ×2
STAPLER RELOAD GREEN 60MM (STAPLE) ×2
SUT MNCRL AB 4-0 PS2 18 (SUTURE) ×2 IMPLANT
SUT SURGIDAC NAB ES-9 0 48 120 (SUTURE) ×1 IMPLANT
SUT VICRYL 0 TIES 12 18 (SUTURE) ×2 IMPLANT
SYR 10ML ECCENTRIC (SYRINGE) ×2 IMPLANT
SYR 20CC LL (SYRINGE) ×2 IMPLANT
SYR 50ML LL SCALE MARK (SYRINGE) ×2 IMPLANT
TOWEL OR 17X26 10 PK STRL BLUE (TOWEL DISPOSABLE) ×2 IMPLANT
TOWEL OR NON WOVEN STRL DISP B (DISPOSABLE) ×2 IMPLANT
TRAY FOLEY W/METER SILVER 14FR (SET/KITS/TRAYS/PACK) IMPLANT
TRAY FOLEY W/METER SILVER 16FR (SET/KITS/TRAYS/PACK) IMPLANT
TROCAR ADV FIXATION 5X100MM (TROCAR) ×2 IMPLANT
TROCAR BLADELESS 15MM (ENDOMECHANICALS) ×2 IMPLANT
TROCAR BLADELESS OPT 5 100 (ENDOMECHANICALS) ×2 IMPLANT
TUBE CALIBRATION LAPBAND (TUBING) ×1 IMPLANT
TUBE CONNECTING VINYL 14FR 30C (MISCELLANEOUS) ×1 IMPLANT
TUBING CONNECTING 10 (TUBING) ×2 IMPLANT
TUBING ENDO SMARTCAP (MISCELLANEOUS) ×2 IMPLANT
TUBING FILTER THERMOFLATOR (ELECTROSURGICAL) ×1 IMPLANT

## 2015-07-05 NOTE — Op Note (Signed)
Preoperative diagnosis: laparoscopic sleeve gastrectomy  Postoperative diagnosis: Same   Procedure: Upper endoscopy   Surgeon: Daneille Desilva, M.D.  Anesthesia: Gen.   Indications for procedure: This patient was undergoing a laparoscopic sleeve gastrectomy.   Description of procedure: The endoscopy was placed in the mouth and into the oropharynx and under endoscopic vision it was advanced to the esophagogastric junction. The pouch was insufflated and no bleeding or bubbles were seen. The GEJ was identified at 36cm from the teeth. No bleeding or leaks were detected. The scope was withdrawn without difficulty.   Emerson Barretto, M.D. General, Bariatric, & Minimally Invasive Surgery [facility]    

## 2015-07-05 NOTE — Op Note (Signed)
07/05/2015 [place] 01/08/1961 [id number]   PRE-OPERATIVE DIAGNOSIS:    Morbid obesity with BMI of 45.0-49.9, adult ([place])   Diabetes mellitus with renal manifestation (HCC)   Essential hypertension   Iron deficiency anemia   Metabolic syndrome X   Scleroderma (HCC)   GERD (gastroesophageal reflux disease)   History of hypercholesterolemia  POST-OPERATIVE DIAGNOSIS:  Same + hiatal hernia +fatty liver  PROCEDURE:  Procedure(s): LAPAROSCOPIC SLEEVE GASTRECTOMY WITH HIATAL HERNIA REPAIR UPPER GI ENDOSCOPY  SURGEON:  Surgeon(s): [name], MD FACS FASMBS  ASSISTANTS: Gurney Maxin, MD   ANESTHESIA:   general  DRAINS: none   BOUGIE: 36 fr ViSiGi  LOCAL MEDICATIONS USED:  MARCAINE + Exparel  SPECIMEN:  Source of Specimen:  Greater curvature of stomach  DISPOSITION OF SPECIMEN:  PATHOLOGY  COUNTS:  YES  INDICATION FOR PROCEDURE: This is a very pleasant 54 year old morbidly obese female who has had unsuccessful attempts for sustained weight loss. She presents today for a planned laparoscopic sleeve gastrectomy with upper endoscopy. We have discussed the risk and benefits of the procedure extensively preoperatively. Please see my separate notes.  PROCEDURE: After obtaining informed consent and receiving 5000 units of subcutaneous heparin, the patient was brought to the operating room at [facility] and placed supine on the operating room table. General endotracheal anesthesia was established. Sequential compression devices were placed. A orogastric tube was placed. The patient's abdomen was prepped and draped in the usual standard surgical fashion. She received preoperative IV antibiotics. A surgical timeout was performed.  Access to the abdomen was achieved using a 5 mm 0 laparoscope thru a 5 mm trocar In the left upper Quadrant 2 fingerbreadths below the left subcostal margin using the Optiview technique. Pneumoperitoneum was smoothly established up to  15 mm of mercury. The laparoscope was advanced and the abdominal cavity was surveilled. The patient was then placed in reverse Trendelenburg. There was no evidence of a hiatal hernia on laparoscopy - gap in the left and right crus anteriorly.  A 5 mm trocar was placed slightly above and to the left of the umbilicus under direct visualization.  The [facility] liver retractor was placed under the left lobe of the liver through a 5 mm trocar incision site in the subxiphoid position. A 5 mm trocar was placed in the lateral right upper quadrant along with a 15 mm trocar in the mid right abdomen. A final 5 mm trocar was placed in the lateral LUQ.  All under direct visualization after local had been infiltrated.  The stomach was inspected. It was completely decompressed and the orogastric tube was removed. Her liver was fatty.   There was no obvious anterior dimple that was obviously visible. The calibration tube was placed in the oropharynx and guided down into the stomach by the CRNA. 10 mL of air was insufflated into the calibration balloon. The calibration tubing was then gently pulled back by the CRNA and it slid past the GE junction. At this point the calibration tubing was desufflated and pulled back into the esophagus. She had a small hiatal hernia seen on her preop UGI.  This confirmed my suspicion of a clinically significant hiatal hernia. The gastrohepatic ligament was incised with harmonic scalpel. The right crus was identified. We identified the crossing fat along the right crus. The adipose tissue just above this area was incised with harmonic scalpel. I then bluntly dissected out this area and identified the left crus. There was evidence of a hiatal hernia. I then  mobilized the esophagus. The left and right crus were further mobilized with blunt dissection. I was then able to reapproximate the left and right crus with 0 Ethibond using an Endostitch suture device and securing it with a titanium tyknot. We  then had the CRNA readvanced the calibration tubing back into the stomach. 10 mL of air was insufflated into the calibration tube balloon. The calibration tube was then gently pulled back and there was resistance at the GE junction. The tube did not slide back up into the esophagus. At this point the calibration tubing was deflated and removed from the patient's body.  We identified the pylorus and measured 6 cm proximal to the pylorus and identified an area of where we would start taking down the short gastric vessels. Harmonic scalpel was used to take down the short gastric vessels along the greater curvature of the stomach. We were able to enter the lesser sac. We continued to march along the greater curvature of the stomach taking down the short gastrics. As we approached the gastrosplenic ligament we took care in this area not to injure the spleen. We were able to take down the entire gastrosplenic ligament. We then mobilized the fundus away from the left crus of diaphragm. There were a few posterior gastric avascular attachments which were taken down. This left the stomach completely mobilized. No vessels had been taken down along the lesser curvature of the stomach.  We then reidentified the pylorus. A 36Fr ViSiGi was then placed in the oropharynx and advanced down into the stomach and placed in the distal antrum and positioned along the lesser curvature. It was placed under suction which secured the 36Fr ViSiGi in place along the lesser curve. Her antrum seemed thick. Then using the Ethicon echelon 60 mm stapler with a black load with Seamguard, I placed a stapler along the antrum approximately 5 cm from the pylorus. The stapler was angled so that there is ample room at the angularis incisura. I then fired the first staple load after inspecting it posteriorly to ensure adequate space both anteriorly and posteriorly. At this point I still was not completely past the angularis so with another black load  with Seamguard, I placed the stapler in position just inside the prior stapleline. We then rotated the stomach to insure that there was adequate anteriorly as well as posteriorly. The stapler was then fired. I used a 51mm green cartridge with seamguard. At this point I started using 60 mm gold load staple cartridges with Seamguard. The echelon stapler was then repositioned with a 60 mm gold load with Seamguard and we continued to march up along the [place]. My assistant was holding traction along the greater curvature stomach along the cauterized short gastric vessels ensuring that the stomach was symmetrically retracted. Prior to each firing of the staple, we rotated the stomach to ensure that there is adequate stomach left.  As we approached the fundus, I used 60 mm blue cartridge with Seamguard aiming slightly lateral to the esophageal fat pad. The sleeve was inspected. There is no evidence of cork screw. The staple line appeared hemostatic. The greater curvature the stomach was grasped with a laparoscopic grasper and placed in a 9mm endocatch bag and removed from the 15 mm trocar site. The CRNA inflated the ViSiGi to the green zone and the upper abdomen was flooded with saline. There were no bubbles. The sleeve was decompressed and the ViSiGi removed. My assistant scrubbed out and performed an upper endoscopy. The sleeve  easily distended with air and the scope was easily advanced to the pylorus. There is no evidence of internal bleeding or cork screwing. There was no narrowing at the angularis. There is no evidence of bubbles. Please see his operative note for further details. The gastric sleeve was decompressed and the endoscope was removed.    The liver retractor was removed. I then closed the 15 mm trocar site with 1 interrupted 0 Vicryl sutures through the fascia using the endoclose. The closure was viewed laparoscopically and it was airtight. 70 cc of Exparel was then infiltrated in the preperitoneal spaces  around the trocar sites. Pneumoperitoneum was released. All trocar sites were closed with a 4-0 Monocryl in a subcuticular fashion followed by the application of Dermabond. The patient was extubated and taken to the recovery room in stable condition. All needle, instrument, and sponge counts were correct x2. There are no immediate complications  (2) 60 mm black with Seamguard (1) 60 mm green with Seamguard (1) 60 mm gold with seamguard (1) 60 mm blue with seamguard  PLAN OF CARE: Admit to inpatient   PATIENT DISPOSITION:  PACU - hemodynamically stable.   Delay start of Pharmacological VTE agent (>24hrs) due to surgical blood loss or risk of bleeding:  no  Leighton Ruff. Redmond Pulling, MD, FACS FASMBS General, Bariatric, & Minimally Invasive Surgery [facility], [place]

## 2015-07-05 NOTE — Transfer of Care (Signed)
Immediate Anesthesia Transfer of Care Note  Patient: Sabrina [name] [facility]  Procedure(s) Performed: Procedure(s): LAPAROSCOPIC GASTRIC SLEEVE RESECTION WITH HIATAL HERNIA REPAIR, upper endoscopy ([name])  Patient Location: PACU  Anesthesia Type:General  Level of Consciousness: awake, alert , oriented and patient cooperative  Airway & Oxygen Therapy: Patient Spontanous Breathing and Patient connected to face mask oxygen  Post-op Assessment: Report given to RN, Post -op Vital signs reviewed and stable and Patient moving all extremities X 4  Post vital signs: stable  Last Vitals:  Filed Vitals:   07/05/15 0905 07/05/15 1331  BP: 112/77 156/93  Pulse: 87 94  Temp: 36.7 C 37 C  Resp: 18 15    Complications: No apparent anesthesia complications

## 2015-07-05 NOTE — Interval H&P Note (Signed)
History and Physical Interval Note:  07/05/2015 10:44 AM  Sabrina [name]  has presented today for surgery, with the diagnosis of Morbid Obesity  The various methods of treatment have been discussed with the patient and family. After consideration of risks, benefits and other options for treatment, the patient has consented to  Procedure(s): LAPAROSCOPIC GASTRIC SLEEVE RESECTION WITH HIATAL HERNIA REPAIR ([name]) as a surgical intervention .  The patient's history has been reviewed, patient examined, no change in status, stable for surgery.  I have reviewed the patient's chart and labs.  Questions were answered to the patient's satisfaction.    Leighton Ruff. Redmond Pulling, MD, [place], Bariatric, & Minimally Invasive Surgery [facility], [place]   [facility] M

## 2015-07-05 NOTE — Anesthesia Procedure Notes (Signed)
Procedure Name: Intubation Date/Time: 07/05/2015 11:24 AM Performed by: Lollie Sails Pre-anesthesia Checklist: Patient identified, Emergency Drugs available, Suction available and Patient being monitored Patient Re-evaluated:Patient Re-evaluated prior to inductionOxygen Delivery Method: Circle System Utilized Preoxygenation: Pre-oxygenation with 100% oxygen Intubation Type: IV induction Ventilation: Mask ventilation without difficulty Laryngoscope Size: Mac and 4 Grade View: Grade II Tube type: Oral Number of attempts: 1 Airway Equipment and Method: Stylet and Oral airway Placement Confirmation: ETT inserted through vocal cords under direct vision,  positive ETCO2 and breath sounds checked- equal and bilateral Secured at: 21 cm Tube secured with: Tape Dental Injury: Teeth and Oropharynx as per pre-operative assessment

## 2015-07-05 NOTE — Anesthesia Postprocedure Evaluation (Signed)
Anesthesia Post Note  Patient: Sabrina [name] [facility]  Procedure(s) Performed: Procedure(s) (LRB): LAPAROSCOPIC GASTRIC SLEEVE RESECTION WITH HIATAL HERNIA REPAIR, upper endoscopy ([name])  Patient location during evaluation: PACU Anesthesia Type: General Level of consciousness: awake and alert Pain management: pain level controlled Vital Signs Assessment: post-procedure vital signs reviewed and stable Respiratory status: spontaneous breathing, nonlabored ventilation, respiratory function stable and patient connected to nasal cannula oxygen Cardiovascular status: blood pressure returned to baseline and stable Postop Assessment: No signs of nausea or vomiting Anesthetic complications: no    Last Vitals:  Filed Vitals:   07/05/15 1600 07/05/15 1706  BP: 172/98 153/92  Pulse: 90 92  Temp: 36.5 C 36.5 C  Resp: 18 18    Last Pain:  Filed Vitals:   07/05/15 1707  PainSc: 5                  Pauleen Goleman DAVID

## 2015-07-05 NOTE — Anesthesia Preprocedure Evaluation (Signed)
Anesthesia Evaluation  Patient identified by MRN, date of birth, ID band Patient awake    Reviewed: Allergy & Precautions, NPO status , Patient's Chart, lab work & pertinent test results  Airway Mallampati: I  TM Distance: >3 FB Neck ROM: Full    Dental   Pulmonary    Pulmonary exam normal       Cardiovascular hypertension, Pt. on medications Normal cardiovascular exam    Neuro/Psych    GI/Hepatic   Endo/Other  diabetes, Type 2, Oral Hypoglycemic Agents  Renal/GU      Musculoskeletal   Abdominal   Peds  Hematology   Anesthesia Other Findings   Reproductive/Obstetrics                             Anesthesia Physical Anesthesia Plan  ASA: III  Anesthesia Plan: General   Post-op Pain Management:    Induction: Intravenous  Airway Management Planned: Oral ETT  Additional Equipment:   Intra-op Plan:   Post-operative Plan: Extubation in OR  Informed Consent: I have reviewed the patients History and Physical, chart, labs and discussed the procedure including the risks, benefits and alternatives for the proposed anesthesia with the patient or authorized representative who has indicated his/her understanding and acceptance.     Plan Discussed with: CRNA and Surgeon  Anesthesia Plan Comments:         Anesthesia Quick Evaluation  

## 2015-07-05 NOTE — H&P (View-Only) (Signed)
Sabrina [name] 07/04/2015 10:36 AM Location: [name] Patient #: [id number] DOB: November 13, 1960 Married / Language: English / Race: Black or African American Female  History of Present Illness Sabrina [name] M. Sabrina Burdin MD; 07/04/2015 10:56 AM) Patient words: bari preop.  The patient is a 54 year old female who presents for a pre-op visit. She comes in today for her preoperative visit. She has been scheduled and approved for laparoscopic sleeve gastrectomy with possible hiatal hernia repair. Her initial visit was in April. Her weight at that time was 253 pounds with a BMI 49.4. She is scheduled for [name] tomorrow. She missed her preoperative appointment this past Friday due to development of some vertigo. She did see her primary care physician and was given a prescription for meclizine. She states that her vertigo has resolved. Otherwise she denies any significant medical changes. Based on her bariatric [name] evaluation labs she was put on supplemental vitamin D and her oral diabetes medication was changed around.  Her upper GI showed a small hiatal hernia. Her labs in April showed a hemoglobin of 10 and hematocrit of 32 with an MCV of 85, her comprehensive metabolic panel was within normal limits. Her hemoglobin A1c at that time was 9.2. Her lipid panel was unremarkable. Her vitamin D level was low at 9.  She has stopped her methotrexate for the upcoming [name].   Problem List/Past Medical Sabrina [name] Sabrina Derby, MD; 07/04/2015 10:57 AM) CHRONIC ASTHMA, UNSPECIFIED ASTHMA SEVERITY, UNCOMPLICATED (Q000111Q) TYPE 2 DIABETES MELLITUS WITH RENAL MANIFESTATIONS, CONTROLLED (E11.29) GASTROESOPHAGEAL REFLUX DISEASE, ESOPHAGITIS PRESENCE NOT SPECIFIED (K21.9) ESSENTIAL HYPERTENSION WITH GOAL BLOOD PRESSURE LESS THAN 130/80 (I10) H/O HYPERCHOLESTEROLEMIA (Z86.39) BODY MASS INDEX (BMI) OF 40.0 TO 49.9 (E66.01) MORPHEA SCLERODERMA (L94.0) ELEVATED SERUM CREATININE  (R79.89) VITAMIN D DEFICIENCY (E55.9) POSITIVE H. PYLORI TEST (A04.8) IRON DEFICIENCY ANEMIA, UNSPECIFIED IRON DEFICIENCY ANEMIA TYPE (D50.9)  Other Problems Sabrina Curry, MD; 07/04/2015 10:57 AM) Bladder Problems  Past Surgical History Sabrina Curry, MD; 07/04/2015 10:57 AM) Oral [name] Mammoplasty; Reduction Bilateral. Hysterectomy (not due to cancer) - Partial Colon Polyp Removal - Colonoscopy  Diagnostic Studies History Sabrina Curry, MD; 07/04/2015 10:57 AM) Mammogram within last year Colonoscopy 1-5 years ago Pap Smear 1-5 years ago  Allergies (Sabrina Eversole, [name]; 579FGE 624THL AM) ACE Inhibitors Codeine Phosphate *ANALGESICS - OPIOID* Potassium Chlorate *CHEMICALS* Amlodipine Besy-Benazepril HCl *ANTIHYPERTENSIVES* Latex Exam Gloves *MEDICAL DEVICES AND SUPPLIES*  Medication History Sabrina Curry, MD; [id number] Q000111Q AM) Folic Acid (1MG  Tablet, Oral) Active. GlipiZIDE (5MG  Tablet, Oral) Active. Invokana (300MG  Tablet, Oral) Active. Klor-Con M20 Sabrina [name] Tablet ER, Oral) Active. Methotrexate (2.5MG  Tablet, Oral) Active. Pravastatin Sodium (10MG  Tablet, Oral) Active. Triamterene-HCTZ (37.5-25MG  Tablet, Oral) Active. Vitamin D (Ergocalciferol) (50000UNIT Capsule, Oral) Active. Mupirocin Calcium (2% Cream, External) Active. Clindamycin Phosphate (1% Gel, External) Active. MetFORMIN HCl (500MG  Tablet, Oral) Active. Calcium (500MG  Tablet, Oral) Active. Multiple Vitamins (Oral) Active. Meclizine HCl (25MG  Tablet, Oral) Active. Medications Reconciled OxyCODONE HCl (5MG /5ML Solution, 5-10 Milliliter Oral every four hours, as needed, Taken starting 07/04/2015) Active. Ondansetron (4MG  Tablet Disperse, 1 (one) Tablet Oral every six hours, as needed, Taken starting 07/04/2015) Active.  Social History Sabrina Curry, MD; 07/04/2015 10:57 AM) Tobacco use Never smoker. Caffeine use Tea. No drug use No alcohol use  Family  History Sabrina Curry, MD; 07/04/2015 10:57 AM) Arthritis Father, Mother, Sister. Bleeding disorder Sister. Cerebrovascular Accident Father. Breast Cancer Sister. Thyroid problems Sister. Respiratory Condition Mother. Diabetes Mellitus Father, Mother. Depression Father, Mother, Sister. Heart Disease Father, Mother.  Prostate Cancer Father. Hypertension Father, Mother.  Pregnancy / Birth History Sabrina Curry, MD; 07/04/2015 10:57 AM) Sabrina [name] 0 Age at menarche 50 years. Age of menopause 53-55 Irregular periods Gravida 0     Review of Systems Sabrina [name] M. Sabrina Fincher MD; 07/04/2015 10:54 AM) General Present- Chills, Fatigue, Night Sweats and Weight Gain. Not Present- Appetite Loss, Fever and Weight Loss. Skin Present- Change in Wart/Mole, Dryness, Non-Healing Wounds and Rash. Not Present- Hives, Jaundice, New Lesions and Ulcer. HEENT Present- Seasonal Allergies, Sinus Pain, Visual Disturbances and Wears glasses/contact lenses. Not Present- Earache, Hearing Loss, Hoarseness, Nose Bleed, Oral Ulcers, Ringing in the Ears, Sore Throat and Yellow Eyes. Respiratory Present- Chronic Cough, Difficulty Breathing, Snoring and Wheezing. Not Present- Bloody sputum. Breast Not Present- Breast Mass, Breast Pain, Nipple Discharge and Skin Changes. Cardiovascular Present- Difficulty Breathing Lying Down, Leg Cramps, Rapid Heart Rate, Shortness of Breath and Swelling of Extremities. Not Present- Chest Pain and Palpitations. Gastrointestinal Present- Bloating, Constipation, Difficulty Swallowing and Indigestion. Not Present- Abdominal Pain, Bloody Stool, Change in Bowel Habits, Chronic diarrhea, Excessive gas, Gets full quickly at meals, Hemorrhoids, Nausea, Rectal Pain and Vomiting. Female Genitourinary Present- Nocturia and Urgency. Not Present- Frequency, Painful Urination and Pelvic Pain. Musculoskeletal Present- Joint Pain, Joint Stiffness and Muscle Pain. Not Present- Back Pain, Muscle  Weakness and Swelling of Extremities. Neurological Present- Trouble walking and Weakness. Not Present- Decreased Memory, Fainting, Headaches, Numbness, Seizures, Tingling and Tremor. Psychiatric Present- Anxiety and Fearful. Not Present- Bipolar, Change in Sleep Pattern, Depression and Frequent crying. Endocrine Present- Cold Intolerance, Hair Changes and Hot flashes. Not Present- Excessive Hunger, Heat Intolerance and New Diabetes. Hematology Present- Easy Bruising. Not Present- Excessive bleeding, Gland problems, HIV and Persistent Infections.  Vitals (Sabrina Eversole [name]; 579FGE 624THL AM) 07/04/2015 10:37 AM Weight: 248 lb Height: 60in Body Surface Area: 2.05 m Body Mass Index: 48.43 kg/m  Temp.: 98.28F(Oral)  Pulse: 88 (Regular)  Resp.: 12 (Unlabored)  BP: 124/78 (Sitting, Left Arm, Standard)      Physical Exam Sabrina [name] M. Vassie Kugel MD; 07/04/2015 10:54 AM)  General Mental Status-Alert. General Appearance-Consistent with stated age. Hydration-Well hydrated. Voice-Normal. Note: MORBIDLY OBESE, CENTRAL TRUNCAL  Integumentary Note: SOME SKIN CONTRACTURES IN VARIOUS PLACES, BASE OF NECK, BACK; scatttered hyperpigmented skin patches  Head and Neck Head-normocephalic, atraumatic with no lesions or palpable masses. Trachea-midline. Thyroid Gland Characteristics - normal size and consistency.  Eye Eyeball - Bilateral-Extraocular movements intact. Sclera/Conjunctiva - Bilateral-No scleral icterus.  Chest and Lung Exam Chest and lung exam reveals -quiet, even and easy respiratory effort with no use of accessory muscles and on auscultation, normal breath sounds, no adventitious sounds and normal vocal resonance. Inspection Chest Wall - Normal. Back - normal.  Breast - Did not examine.  Cardiovascular Cardiovascular examination reveals -normal heart sounds, regular rate and rhythm with no murmurs and normal pedal pulses  bilaterally.  Abdomen Inspection Inspection of the abdomen reveals - No Hernias. Skin - Scar - Note: LOWER MIDLINE SCAR. Palpation/Percussion Palpation and Percussion of the abdomen reveal - Soft, Non Tender, No Rebound tenderness, No Rigidity (guarding) and No hepatosplenomegaly. Auscultation Auscultation of the abdomen reveals - Bowel sounds normal.  Peripheral Vascular Upper Extremity Palpation - Pulses bilaterally normal.  Neurologic Neurologic evaluation reveals -alert and oriented x 3 with no impairment of recent or remote memory. Mental Status-Normal.  Neuropsychiatric The patient's mood and affect are described as -normal. Judgment and Insight-insight is appropriate concerning matters relevant to self.  Musculoskeletal Normal Exam - Left-Upper Extremity Strength Normal and  Lower Extremity Strength Normal. Normal Exam - Right-Upper Extremity Strength Normal and Lower Extremity Strength Normal.  Lymphatic Head & Neck  General Head & Neck Lymphatics: Bilateral - Description - Normal. Axillary - Did not examine. Femoral & Inguinal - Did not examine.    Assessment & Plan Sabrina [name] M. Simar Pothier MD; 07/04/2015 10:57 AM)  BODY MASS INDEX (BMI) OF 40.0 TO 49.9 (E66.01) Impression: We reviewed her preoperative workup. We reviewed her upper GI which showed a small hiatal hernia. She does have some occasional reflux. We discussed what a hiatal hernia repair would involve. I explained that we would test for a hiatal hernia intraoperatively. If found to have a clinically significant one I recommended repair. We discussed the risk and benefits of that. We also rediscussed the typical postoperative course. She was given prescriptions for postoperative pain and nausea.  Current Plans Pt Education - EMW_preopbariatric Started OxyCODONE HCl 5MG /5ML, 5-10 Milliliter every four hours, as needed, 200 Milliliter, 07/04/2015, No Refill. Started Ondansetron 4MG , 1 (one) Tablet every  six hours, as needed, #20, 07/04/2015, No Refill. CHRONIC ASTHMA, UNSPECIFIED ASTHMA SEVERITY, UNCOMPLICATED (Q000111Q)  TYPE 2 DIABETES MELLITUS WITH RENAL MANIFESTATIONS, CONTROLLED (E11.29)  GASTROESOPHAGEAL REFLUX DISEASE, ESOPHAGITIS PRESENCE NOT SPECIFIED (K21.9) Impression: Per PCP, takes Prilosec when necessary  ESSENTIAL HYPERTENSION WITH GOAL BLOOD PRESSURE LESS THAN 130/80 (I10)  VITAMIN D DEFICIENCY (E55.9) Impression: Managed by PCP; currently taking vitamin D once a week  MORPHEA SCLERODERMA (L94.0) Impression: managed by [place], UNSPECIFIED IRON DEFICIENCY ANEMIA TYPE (D50.9) Impression: She has a long-standing history of anemia. We discussed the likelihood of needing supplemental iron postoperatively  Leighton Ruff. Redmond Pulling, MD, FACS General, Bariatric, & Minimally Invasive [name] [name], [place]

## 2015-07-06 ENCOUNTER — Inpatient Hospital Stay (HOSPITAL_COMMUNITY): Payer: 59

## 2015-07-06 LAB — GLUCOSE, CAPILLARY
GLUCOSE-CAPILLARY: 119 mg/dL — AB (ref 65–99)
Glucose-Capillary: 152 mg/dL — ABNORMAL HIGH (ref 65–99)
Glucose-Capillary: 112 mg/dL — ABNORMAL HIGH (ref 65–99)

## 2015-07-06 LAB — CBC WITH DIFFERENTIAL/PLATELET
BASOS ABS: 0 10*3/uL (ref 0.0–0.1)
BASOS PCT: 0 %
EOS ABS: 0 10*3/uL (ref 0.0–0.7)
EOS PCT: 0 %
HEMATOCRIT: 35 % — AB (ref 36.0–46.0)
HEMOGLOBIN: 11.1 g/dL — AB (ref 12.0–15.0)
Lymphocytes Relative: 12 %
Lymphs Abs: 1.7 10*3/uL (ref 0.7–4.0)
MCH: 27.8 pg (ref 26.0–34.0)
MCHC: 31.7 g/dL (ref 30.0–36.0)
MCV: 87.5 fL (ref 78.0–100.0)
MONO ABS: 0.8 10*3/uL (ref 0.1–1.0)
Monocytes Relative: 6 %
NEUTROS ABS: 11.7 10*3/uL — AB (ref 1.7–7.7)
Neutrophils Relative %: 82 %
Platelets: 415 10*3/uL — ABNORMAL HIGH (ref 150–400)
RBC: 4 MIL/uL (ref 3.87–5.11)
RDW: 14.4 % (ref 11.5–15.5)
WBC: 14.2 10*3/uL — ABNORMAL HIGH (ref 4.0–10.5)

## 2015-07-06 LAB — COMPREHENSIVE METABOLIC PANEL
ALBUMIN: 3.5 g/dL (ref 3.5–5.0)
ALK PHOS: 49 U/L (ref 38–126)
ALT: 76 U/L — ABNORMAL HIGH (ref 14–54)
ANION GAP: 9 (ref 5–15)
AST: 77 U/L — AB (ref 15–41)
BILIRUBIN TOTAL: 0.7 mg/dL (ref 0.3–1.2)
BUN: 20 mg/dL (ref 6–20)
CALCIUM: 8.9 mg/dL (ref 8.9–10.3)
CO2: 28 mmol/L (ref 22–32)
Chloride: 99 mmol/L — ABNORMAL LOW (ref 101–111)
Creatinine, Ser: 1.21 mg/dL — ABNORMAL HIGH (ref 0.44–1.00)
GFR calc Af Amer: 59 mL/min — ABNORMAL LOW (ref >60)
GFR, EST NON AFRICAN AMERICAN: 51 mL/min — AB (ref >60)
GLUCOSE: 151 mg/dL — AB (ref 65–99)
POTASSIUM: 4 mmol/L (ref 3.5–5.1)
Sodium: 136 mmol/L (ref 135–145)
TOTAL PROTEIN: 7.7 g/dL (ref 6.5–8.1)

## 2015-07-06 LAB — HEMOGLOBIN AND HEMATOCRIT, BLOOD
HCT: 32 % — ABNORMAL LOW (ref 36.0–46.0)
HEMOGLOBIN: 10.2 g/dL — AB (ref 12.0–15.0)

## 2015-07-06 MED ORDER — IOHEXOL 300 MG/ML  SOLN
50.0000 mL | Freq: Once | INTRAMUSCULAR | Status: DC | PRN
  Administered 2015-07-06: 50 mL via ORAL
  Filled 2015-07-06: qty 50

## 2015-07-06 NOTE — Progress Notes (Signed)
1 Day Post-Op  Subjective: Doing well except for HA. Min nausea and pain. Slept well. walked  Objective: Vital signs in last 24 hours: Temp:  [97.6 F (36.4 C)-98.6 F (37 C)] 98.5 F (36.9 C) (11/23 0528) Pulse Rate:  [81-98] 98 (11/23 0528) Resp:  [15-22] 18 (11/23 0528) BP: (112-177)/(64-98) 128/64 mmHg (11/23 0528) SpO2:  [95 %-100 %] 100 % (11/23 0528) Weight:  [112.152 kg (247 lb 4 oz)] 112.152 kg (247 lb 4 oz) (11/22 0938) Last BM Date: 07/04/15  Intake/Output from previous day: 11/22 0701 - 11/23 0700 In: 4508.3 [I.V.:4508.3] Out: 3650 [Urine:3600; Blood:50] Intake/Output this shift:    Looks good. Nontoxic.  cta b/l Reg Soft, obese, min to NT, incisions c/d/i No edema, +SCDs  Lab Results:   Recent Labs  07/05/15 1418 07/06/15 0517  WBC  --  14.2*  HGB 11.8* 11.1*  HCT 35.2* 35.0*  PLT  --  415*   BMET  Recent Labs  07/05/15 1418 07/06/15 0517  NA 138 136  K 4.2 4.0  CL 103 99*  CO2 21* 28  GLUCOSE 188* 151*  BUN 26* 20  CREATININE 1.44* 1.21*  CALCIUM 8.8* 8.9   PT/INR No results for input(s): LABPROT, INR in the last 72 hours. ABG No results for input(s): PHART, HCO3 in the last 72 hours.  Invalid input(s): PCO2, PO2  Studies/Results: No results found.  Anti-infectives: Anti-infectives    Start     Dose/Rate Route Frequency Ordered Stop   07/05/15 0931  cefoTEtan in Dextrose 5% (CEFOTAN) IVPB 2 g     2 g Intravenous On call to O.R. 07/05/15 0931 07/05/15 1120      Assessment/Plan: s/p Procedure(s): LAPAROSCOPIC GASTRIC SLEEVE RESECTION WITH HIATAL HERNIA REPAIR, upper endoscopy ([name])  Looks good. No fever. No tachycardia For UGI this am. If ok, will start pod 1diet Ambulate, pulm toilet Cont chemical vte prophylaxis Stop blood glucose checks - normal  Sabrina Ruff. Redmond Pulling, MD, FACS General, Bariatric, & Minimally Invasive Surgery [facility], [place]   LOS: 1 day    [name] 07/06/2015

## 2015-07-06 NOTE — Progress Notes (Signed)
Patient alert and oriented, pain is controlled. Patient is tolerating fluids, advanced to protein shake tomorrow.  Reviewed Gastric sleeve discharge instructions with patient and patient is able to articulate understanding.  Provided information on BELT program, Support Group and [facility] outpatient pharmacy. All questions answered, will continue to monitor.

## 2015-07-06 NOTE — Care Management Note (Signed)
Case Management Note  Patient Details  Name: SADAF BOLEJACK MRN: [id number] Date of Birth: 04-Apr-1961  Subjective/Objective:    S/p laparoscopic sleeve gastrectomy                Action/Plan: Discharge planning, no new needs identified  Expected Discharge Date:                  Expected Discharge Plan:  Home/Self Care  In-House Referral:  NA  Discharge planning Services  CM Consult  Post Acute Care Choice:  NA Choice offered to:  NA  DME Arranged:  [name] DME Agency:  NA  HH Arranged:  NA HH Agency:  NA  Status of Service:  Completed, signed off  Medicare Important Message Given:    Date Medicare IM Given:    Medicare IM give by:    Date Additional Medicare IM Given:    Additional Medicare Important Message give by:     If discussed at [place] of Stay Meetings, dates discussed:    Additional Comments:  Guadalupe Maple, RN 07/06/2015, 10:48 AM

## 2015-07-06 NOTE — Discharge Instructions (Signed)

## 2015-07-06 NOTE — Plan of Care (Signed)
Problem: Food- and Nutrition-Related Knowledge Deficit (NB-1.1) Goal: Nutrition education Formal process to instruct or train a patient/client in a skill or to impart knowledge to help patients/clients voluntarily manage or modify food choices and eating behavior to maintain or improve health. Outcome: Completed/Met Date Met:  07/06/15 Nutrition Education Note  Received consult for diet education per DROP protocol.   Discussed 2 week post op diet with pt. Emphasized that liquids must be non carbonated, non caffeinated, and sugar free. Fluid goals discussed. Pt to follow up with outpatient bariatric RD for further diet progression after 2 weeks. Multivitamins and minerals also reviewed. Teach back method used, pt expressed understanding, expect good compliance.   Diet: First 2 Weeks  You will see the nutritionist about two (2) weeks after your surgery. The nutritionist will increase the types of foods you can eat if you are handling liquids well:  If you have severe vomiting or nausea and cannot handle clear liquids lasting longer than 1 day, call your surgeon  Protein Shake  Drink at least 2 ounces of shake 5-6 times per day  Each serving of protein shakes (usually 8 - 12 ounces) should have a minimum of:  15 grams of protein  And no more than 5 grams of carbohydrate  Goal for protein each day:  Men = 80 grams per day  Women = 60 grams per day  Protein powder may be added to fluids such as non-fat milk or Lactaid milk or Soy milk (limit to 35 grams added protein powder per serving)   Hydration  Slowly increase the amount of water and other clear liquids as tolerated (See Acceptable Fluids)  Slowly increase the amount of protein shake as tolerated  Sip fluids slowly and throughout the day  May use sugar substitutes in small amounts (no more than 6 - 8 packets per day; i.e. Splenda)   Fluid Goal  The first goal is to drink at least 8 ounces of protein shake/drink per day (or as directed  by the nutritionist); some examples of protein shakes are Syntrax Nectar, Adkins Advantage, EAS Edge HP, and Unjury. See handout from pre-op Bariatric Education Class:  Slowly increase the amount of protein shake you drink as tolerated  You may find it easier to slowly sip shakes throughout the day  It is important to get your proteins in first  Your fluid goal is to drink 64 - 100 ounces of fluid daily  It may take a few weeks to build up to this  32 oz (or more) should be clear liquids  And  32 oz (or more) should be full liquids (see below for examples)  Liquids should not contain sugar, caffeine, or carbonation   Clear Liquids:  Water or Sugar-free flavored water (i.e. Fruit H2O, Propel)  Decaffeinated coffee or tea (sugar-free)  Crystal Lite, Wyler's Lite, Minute Maid Lite  Sugar-free Jell-O  Bouillon or broth  Sugar-free Popsicle: *Less than 20 calories each; Limit 1 per day   Full Liquids:  Protein Shakes/Drinks + 2 choices per day of other full liquids  Full liquids must be:  No More Than 12 grams of Carbs per serving  No More Than 3 grams of Fat per serving  Strained low-fat cream soup  Non-Fat milk  Fat-free Lactaid Milk  Sugar-free yogurt (Dannon Lite & Fit, Greek yogurt)     Sabrina Bronkema, MS, RD, LDN Pager: [phone] After Hours Pager: [phone]        

## 2015-07-07 LAB — CBC WITH DIFFERENTIAL/PLATELET
BASOS ABS: 0 10*3/uL (ref 0.0–0.1)
Basophils Relative: 0 %
EOS PCT: 1 %
Eosinophils Absolute: 0.2 10*3/uL (ref 0.0–0.7)
HCT: 32.5 % — ABNORMAL LOW (ref 36.0–46.0)
HEMOGLOBIN: 10.5 g/dL — AB (ref 12.0–15.0)
LYMPHS PCT: 18 %
Lymphs Abs: 2.4 10*3/uL (ref 0.7–4.0)
MCH: 27.8 pg (ref 26.0–34.0)
MCHC: 32.3 g/dL (ref 30.0–36.0)
MCV: 86 fL (ref 78.0–100.0)
Monocytes Absolute: 1.1 10*3/uL — ABNORMAL HIGH (ref 0.1–1.0)
Monocytes Relative: 8 %
NEUTROS PCT: 73 %
Neutro Abs: 9.8 10*3/uL — ABNORMAL HIGH (ref 1.7–7.7)
PLATELETS: 358 10*3/uL (ref 150–400)
RBC: 3.78 MIL/uL — AB (ref 3.87–5.11)
RDW: 14.5 % (ref 11.5–15.5)
WBC: 13.4 10*3/uL — AB (ref 4.0–10.5)

## 2015-07-07 MED ORDER — TRIAMTERENE-HCTZ 37.5-25 MG PO TABS
1.0000 | ORAL_TABLET | Freq: Every day | ORAL | Status: DC
  Administered 2015-07-07: 1 via ORAL
  Filled 2015-07-07: qty 1

## 2015-07-07 NOTE — Discharge Summary (Signed)
Physician Discharge Summary  Patient ID:  Sabrina [name]  MRN: [id number]  DOB/AGE: March 09, 1961 54 y.o.  Admit date: 07/05/2015 Discharge date: 07/07/2015  Discharge Diagnoses:   Principal Problem:   Morbid obesity with BMI of 45.0-49.9, adult (HCC) Active Problems:   Diabetes mellitus with renal manifestation (HCC)   Essential hypertension   Iron deficiency anemia   Metabolic syndrome X   Scleroderma (HCC)   GERD (gastroesophageal reflux disease)   History of hypercholesterolemia   S/P laparoscopic sleeve gastrectomy  Operation: Procedure(s): LAPAROSCOPIC GASTRIC SLEEVE RESECTION WITH HIATAL HERNIA REPAIR, upper endoscopy on 07/05/2015 [place] Pulling  Discharged Condition: good  Hospital Course: Sabrina [name] is an 54 y.o. female whose primary care physician is [name], MD and who was admitted 07/05/2015 with a chief complaint of No chief complaint on file. [name]   She was brought to the operating room on 07/05/2015 and underwent  LAPAROSCOPIC GASTRIC SLEEVE RESECTION WITH HIATAL HERNIA REPAIR, upper endoscopy.   Her swallow the first post op day looked good. She is taking protein drinks and tolerating these well.  Her husband is in the room with her.  The discharge instructions were reviewed with the patient.  Consults: None  Significant Diagnostic Studies: Results for orders placed or performed during the hospital encounter of 07/05/15  Glucose, capillary  Result Value Ref Range   Glucose-Capillary 107 (H) 65 - 99 mg/dL   Comment 1 Notify RN   Glucose, capillary  Result Value Ref Range   Glucose-Capillary 141 (H) 65 - 99 mg/dL   Comment 1 Notify RN    Comment 2 Document in Chart   Hemoglobin and hematocrit, blood  Result Value Ref Range   Hemoglobin 11.8 (L) 12.0 - 15.0 g/dL   HCT 35.2 (L) 36.0 - [id number] %  Basic metabolic panel  Result Value Ref Range   Sodium 138 135 - 145 mmol/L   Potassium 4.2 3.5 - 5.1 mmol/L   Chloride 103 101 - 111 mmol/L    CO2 21 (L) 22 - 32 mmol/L   Glucose, Bld 188 (H) 65 - 99 mg/dL   BUN 26 (H) 6 - 20 mg/dL   Creatinine, Ser 1.44 (H) 0.44 - 1.00 mg/dL   Calcium 8.8 (L) 8.9 - 10.3 mg/dL   GFR calc non Af Amer 41 (L) >60 mL/min   GFR calc Af Amer 48 (L) >60 mL/min   Anion gap 14 5 - 15  Glucose, capillary  Result Value Ref Range   Glucose-Capillary 159 (H) 65 - 99 mg/dL  CBC WITH DIFFERENTIAL  Result Value Ref Range   WBC 14.2 (H) 4.0 - 10.5 K/uL   RBC 4.00 3.87 - 5.11 MIL/uL   Hemoglobin 11.1 (L) 12.0 - 15.0 g/dL   HCT 35.0 (L) 36.0 - 46.0 %   MCV 87.5 78.0 - 100.0 fL   MCH 27.8 26.0 - 34.0 pg   MCHC 31.7 30.0 - 36.0 g/dL   RDW 14.4 11.5 - 15.5 %   Platelets 415 (H) 150 - 400 K/uL   Neutrophils Relative % 82 %   Neutro Abs 11.7 (H) 1.7 - 7.7 K/uL   Lymphocytes Relative 12 %   Lymphs Abs 1.7 0.7 - 4.0 K/uL   Monocytes Relative 6 %   Monocytes Absolute 0.8 0.1 - 1.0 K/uL   Eosinophils Relative 0 %   Eosinophils Absolute 0.0 0.0 - 0.7 K/uL   Basophils Relative 0 %   Basophils Absolute 0.0 0.0 - 0.1 K/uL  Comprehensive metabolic  panel  Result Value Ref Range   Sodium 136 135 - 145 mmol/L   Potassium 4.0 3.5 - 5.1 mmol/L   Chloride 99 (L) 101 - 111 mmol/L   CO2 28 22 - 32 mmol/L   Glucose, Bld 151 (H) 65 - 99 mg/dL   BUN 20 6 - 20 mg/dL   Creatinine, Ser 1.21 (H) 0.44 - 1.00 mg/dL   Calcium 8.9 8.9 - 10.3 mg/dL   Total Protein 7.7 6.5 - 8.1 g/dL   Albumin 3.5 3.5 - 5.0 g/dL   AST 77 (H) 15 - 41 U/L   ALT 76 (H) 14 - 54 U/L   Alkaline Phosphatase 49 38 - 126 U/L   Total Bilirubin 0.7 0.3 - 1.2 mg/dL   GFR calc non Af Amer 51 (L) >60 mL/min   GFR calc Af Amer 59 (L) >60 mL/min   Anion gap 9 5 - 15  Glucose, capillary  Result Value Ref Range   Glucose-Capillary 152 (H) 65 - 99 mg/dL  Glucose, capillary  Result Value Ref Range   Glucose-Capillary 149 (H) 65 - 99 mg/dL  Hemoglobin and hematocrit, blood  Result Value Ref Range   Hemoglobin 10.2 (L) 12.0 - 15.0 g/dL   HCT 32.0 (L)  36.0 - 46.0 %  Glucose, capillary  Result Value Ref Range   Glucose-Capillary 152 (H) 65 - 99 mg/dL  Glucose, capillary  Result Value Ref Range   Glucose-Capillary 119 (H) 65 - 99 mg/dL  Glucose, capillary  Result Value Ref Range   Glucose-Capillary 112 (H) 65 - 99 mg/dL  CBC with Differential  Result Value Ref Range   WBC 13.4 (H) 4.0 - 10.5 K/uL   RBC 3.78 (L) 3.87 - 5.11 MIL/uL   Hemoglobin 10.5 (L) 12.0 - 15.0 g/dL   HCT 32.5 (L) 36.0 - 46.0 %   MCV 86.0 78.0 - 100.0 fL   MCH 27.8 26.0 - 34.0 pg   MCHC 32.3 30.0 - 36.0 g/dL   RDW 14.5 11.5 - 15.5 %   Platelets 358 150 - 400 K/uL   Neutrophils Relative % 73 %   Neutro Abs 9.8 (H) 1.7 - 7.7 K/uL   Lymphocytes Relative 18 %   Lymphs Abs 2.4 0.7 - 4.0 K/uL   Monocytes Relative 8 %   Monocytes Absolute 1.1 (H) 0.1 - 1.0 K/uL   Eosinophils Relative 1 %   Eosinophils Absolute 0.2 0.0 - 0.7 K/uL   Basophils Relative 0 %   Basophils Absolute 0.0 0.0 - 0.1 K/uL    Dg Ugi W/water Sol Cm  07/06/2015  CLINICAL DATA:  Initial encounter for status post hiatal hernia repair with gastric bypass procedure. EXAM: WATER SOLUBLE UPPER GI SERIES TECHNIQUE: Single-column upper GI series was performed using water soluble contrast. CONTRAST:  12mL OMNIPAQUE IOHEXOL 300 MG/ML  SOLN COMPARISON:  12/03/2014. FLUOROSCOPY TIME:  Radiation Exposure Index (as provided by the fluoroscopic device): If the device does not provide the exposure index: Fluoroscopy Time (in minutes and seconds):  1 minutes and 6 seconds. Number of Acquired Images: FINDINGS: Patient was given small volume water-soluble contrast by mouth. Contrast flows through the esophagogastric junction and deformity in the region of the gastric cardia is compatible with the reported history of hiatal hernia repair. There is no evidence for contrast leak across the gastric suture line. Free flow of contrast through the pylorus into the duodenum is evident. Duodenum is very mildly distended and  peristalsis is sluggish. IMPRESSION: No evidence for contrast  leak in this patient status post hiatal hernia repair and gastric sleeve procedure. No gastric outlet obstruction. Electronically Signed   By: Misty Stanley M.D.   On: 07/06/2015 10:24    Discharge Exam:  Filed Vitals:   07/07/15 0154 07/07/15 0550  BP: 159/92 135/90  Pulse: 92 90  Temp: 98.6 F (37 C) 98.2 F (36.8 C)  Resp: 20 18    General: Obese AA F who is alert and generally healthy appearing.  Lungs: Clear to auscultation and symmetric breath sounds. Heart:  RRR. No murmur or rub. Abdomen: Soft. No mass.  No hernia. Normal bowel sounds. Her incisions look good.  Discharge Medications:     Medication List    TAKE these medications        canagliflozin 300 MG Tabs tablet  Commonly known as:  INVOKANA  Take 300 mg by mouth daily before breakfast.  Notes to Patient:  Monitor Blood Sugar Frequently and keep a log for primary care physician, you may need to adjust medication dosage with rapid weight loss.        ergocalciferol 50000 UNITS capsule  Commonly known as:  VITAMIN D2  Take 1 capsule (50,000 Units total) by mouth once a week. One capsule once weekly     fluticasone 50 MCG/ACT nasal spray  Commonly known as:  FLONASE  Place 16 sprays into both nostrils daily.     folic acid 1 MG tablet  Commonly known as:  FOLVITE  Take 1 mg by mouth daily.     meclizine 50 MG tablet  Commonly known as:  ANTIVERT  Take 0.5 tablets (25 mg total) by mouth 3 (three) times daily as needed.     metFORMIN 500 MG tablet  Commonly known as:  GLUCOPHAGE  Take 2 tablets (1,000 mg total) by mouth 2 (two) times daily with a meal.  Notes to Patient:  Monitor Blood Sugar Frequently and keep a log for primary care physician, you may need to adjust medication dosage with rapid weight loss.        methotrexate 2.5 MG tablet  Commonly known as:  RHEUMATREX  Take 17.5 mg by mouth once a week. Takes 7 tablets every Friday.  Caution:Chemotherapy. Protect from light.     montelukast 10 MG tablet  Commonly known as:  SINGULAIR  Take 1 tablet (10 mg total) by mouth at bedtime.     multivitamin with minerals Tabs tablet  Take 1 tablet by mouth daily.     potassium chloride SA 20 MEQ tablet  Commonly known as:  K-DUR,KLOR-CON  Take 1 tablet (20 mEq total) by mouth 2 (two) times daily.     triamterene-hydrochlorothiazide 37.5-25 MG tablet  Commonly known as:  MAXZIDE-25  TAKE 1 TABLET BY MOUTH DAILY.  Notes to Patient:  Monitor Blood Pressure Daily and keep a log for primary care physician.  Monitor for symptoms of dehydration.  You may need to make changes to your medications with rapid weight loss.           Disposition: 01-Home or Self Care      Discharge Instructions    Ambulate hourly while awake    Complete by:  As directed      Call MD for:  difficulty breathing, headache or visual disturbances    Complete by:  As directed      Call MD for:  persistant dizziness or light-headedness    Complete by:  As directed      Call MD for:  persistant nausea and vomiting    Complete by:  As directed      Call MD for:  redness, tenderness, or signs of infection (pain, swelling, redness, odor or green/yellow discharge around incision site)    Complete by:  As directed      Call MD for:  severe uncontrolled pain    Complete by:  As directed      Call MD for:  temperature >101 F    Complete by:  As directed      Diet bariatric full liquid    Complete by:  As directed      Incentive spirometry    Complete by:  As directed   Perform hourly while awake           Follow-up Information    Follow up with [name], MD. Go on 07/14/2015.   Specialty:  General Surgery   Why:  For Post-Op Check at 1:30   Contact information:   [address] [place] [address] [phone]       Follow up with [name], MD. Go on 08/11/2015.   Specialty:  General Surgery   Why:  For Post-Op Check  at 2:00   Contact information:   [address] [place] [address] [phone]        Signed: Alphonsa Overall, M.D., [facility] Office:  [phone]  07/07/2015, 8:55 AM

## 2015-07-07 NOTE — Progress Notes (Signed)
Pt's vitals WNL, pain is under control and is tolerating diet. Discussed discharge instructions with patient and answered all questions. Discharged to home.

## 2015-07-11 ENCOUNTER — Telehealth (HOSPITAL_COMMUNITY): Payer: Self-pay

## 2015-07-11 NOTE — Telephone Encounter (Signed)

## 2015-07-14 ENCOUNTER — Other Ambulatory Visit: Payer: Self-pay | Admitting: Family Medicine

## 2015-07-19 ENCOUNTER — Encounter: Payer: 59 | Attending: General Surgery

## 2015-07-19 VITALS — Ht 60.0 in | Wt 231.0 lb

## 2015-07-19 DIAGNOSIS — Z713 Dietary counseling and surveillance: Secondary | ICD-10-CM | POA: Insufficient documentation

## 2015-07-19 DIAGNOSIS — Z6841 Body Mass Index (BMI) 40.0 and over, adult: Secondary | ICD-10-CM | POA: Diagnosis not present

## 2015-07-20 NOTE — Progress Notes (Signed)
Bariatric Class:  Appt start time: 1530 end time:  1630.  2 Week Post-Operative Nutrition Class  Patient was seen on 07/19/15 for Post-Operative Nutrition education at [facility].   Surgery date: 07/05/15 Surgery type: Sleeve gastrectomy Start weight at [facility]: 251.5 lbs on 12/31/14 Weight today: 231.0 lbs  Weight change: 18.5 lbs  TANITA  BODY COMP RESULTS  06/20/15 07/19/15   BMI (kg/m^2) 48.7 45.1   Fat Mass (lbs) 135.5 118.0   Fat Free Mass (lbs) 114 113.0   Total Body Water (lbs) 83.5 82.5    The following the learning objectives were met by the patient during this course:  Identifies Phase 3A (Soft, High Proteins) Dietary Goals and will begin from 2 weeks post-operatively to 2 months post-operatively  Identifies appropriate sources of fluids and proteins   States protein recommendations and appropriate sources post-operatively  Identifies the need for appropriate texture modifications, mastication, and bite sizes when consuming solids  Identifies appropriate multivitamin and calcium sources post-operatively  Describes the need for physical activity post-operatively and will follow MD recommendations  States when to call healthcare provider regarding medication questions or post-operative complications  Handouts given during class include:  Phase 3A: Soft, High Protein Diet Handout  Follow-Up Plan: Patient will follow-up at [facility] in 6 weeks for 2 month post-op nutrition visit for diet advancement per MD.

## 2015-08-22 LAB — HEMOGLOBIN A1C
Hgb A1c MFr Bld: 6.2 % — ABNORMAL HIGH (ref <5.7)
Mean Plasma Glucose: 131 mg/dL — ABNORMAL HIGH (ref <117)

## 2015-08-23 LAB — COMPLETE METABOLIC PANEL WITH GFR
ALK PHOS: 44 U/L (ref 33–130)
ALT: 10 U/L (ref 6–29)
AST: 18 U/L (ref 10–35)
Albumin: 3.5 g/dL — ABNORMAL LOW (ref 3.6–5.1)
BUN: 24 mg/dL (ref 7–25)
CALCIUM: 8.8 mg/dL (ref 8.6–10.4)
CO2: 33 mmol/L — ABNORMAL HIGH (ref 20–31)
Chloride: 96 mmol/L — ABNORMAL LOW (ref 98–110)
Creat: 1.6 mg/dL — ABNORMAL HIGH (ref 0.50–1.05)
GFR, EST AFRICAN AMERICAN: 42 mL/min — AB (ref >=60)
GFR, EST NON AFRICAN AMERICAN: 37 mL/min — AB (ref >=60)
GLUCOSE: 110 mg/dL — AB (ref 65–99)
Potassium: 3.3 mmol/L — ABNORMAL LOW (ref 3.5–5.3)
Sodium: 142 mmol/L (ref 135–146)
TOTAL PROTEIN: 7.5 g/dL (ref 6.1–8.1)
Total Bilirubin: 0.4 mg/dL (ref 0.2–1.2)

## 2015-08-23 LAB — VITAMIN D 25 HYDROXY (VIT D DEFICIENCY, FRACTURES): Vit D, 25-Hydroxy: 36 ng/mL (ref 30–100)

## 2015-08-24 ENCOUNTER — Encounter: Payer: Self-pay | Admitting: Family Medicine

## 2015-08-24 ENCOUNTER — Ambulatory Visit (INDEPENDENT_AMBULATORY_CARE_PROVIDER_SITE_OTHER): Payer: 59 | Admitting: Family Medicine

## 2015-08-24 VITALS — BP 96/68 | HR 84 | Resp 18 | Ht 60.0 in | Wt 221.0 lb

## 2015-08-24 DIAGNOSIS — E559 Vitamin D deficiency, unspecified: Secondary | ICD-10-CM

## 2015-08-24 DIAGNOSIS — E1129 Type 2 diabetes mellitus with other diabetic kidney complication: Secondary | ICD-10-CM

## 2015-08-24 DIAGNOSIS — I10 Essential (primary) hypertension: Secondary | ICD-10-CM | POA: Diagnosis not present

## 2015-08-24 DIAGNOSIS — R112 Nausea with vomiting, unspecified: Secondary | ICD-10-CM | POA: Diagnosis not present

## 2015-08-24 DIAGNOSIS — Z6841 Body Mass Index (BMI) 40.0 and over, adult: Secondary | ICD-10-CM

## 2015-08-24 DIAGNOSIS — N289 Disorder of kidney and ureter, unspecified: Secondary | ICD-10-CM

## 2015-08-24 MED ORDER — ONDANSETRON HCL 4 MG PO TABS: ORAL_TABLET | ORAL | Status: DC

## 2015-08-24 MED ORDER — ERGOCALCIFEROL 1.25 MG (50000 UT) PO CAPS: 50000.0000 [IU] | ORAL_CAPSULE | ORAL | Status: DC

## 2015-08-24 NOTE — Assessment & Plan Note (Signed)
Need to discontinue medication, hypotensive and symptomatic with weight loss also renal insufficiency , which is new. Pt reprots daily nausea and vomit with poor intake Recheck BP in 1 then 4 weeks DASH diet and commitment to daily physical activity for a minimum of 30 minutes discussed and encouraged, as a part of hypertension management. The importance of attaining a healthy weight is also discussed.  BP/Weight 08/24/2015 07/19/2015 07/07/2015 07/05/2015 06/30/2015 06/20/2015 66/59/9357  Systolic BP 96 - 017 - 793 - 903  Diastolic BP 68 - 82 - 53 - 80  Wt. (Lbs) 221 231 262.79 - 247.31 249.5 254  BMI 43.16 45.11 - 51.32 48.3 48.73 49.61   Rept chem 7 and EGFR in 4 weeks Encourage 64 ounces water daily

## 2015-08-24 NOTE — Assessment & Plan Note (Signed)
Improved dramatically, 40 pound weight loss since 06/2015 when she had bariatric surgery    Weight /BMI 08/24/2015 07/19/2015 07/07/2015  WEIGHT 221 lb 231 lb 262 lb 12.6 oz  HEIGHT 5\' 0"  5\' 0"  -  BMI 43.16 kg/m2 45.11 kg/m2 -    Current exercise per week 90 minutes.

## 2015-08-24 NOTE — Assessment & Plan Note (Signed)
Significant increase in creatinine with weight loss and reduced intake. Pt off diabetic med x 2weeks, antihypertensive discontinued today will rept chem 7 in 4 weeks. She is voiding normally but has very poor oral intake even of water. If not improved will need nephrology eval

## 2015-08-24 NOTE — Assessment & Plan Note (Signed)
Both metformin and invokana were d/c by surgeon 2 weeks ago per pt report , which  I absolutely agree with due to reduced intake and elevation in creatinine

## 2015-08-24 NOTE — Assessment & Plan Note (Signed)
Continue weekly supplement 

## 2015-08-24 NOTE — Patient Instructions (Signed)
Nurse bP re check in 1 week, then again in 4 weeks from today  MD visit in 8 weeks, call if you need me sooner  Chem 7 and EGFR in 4 weeks, non fasting  STOP blood pressure medication,. don't need this (triamterene)  Zofran sent for nausea as needed  Thankful success with surgery, health is already improved  Thanks for choosing [facility], we consider it a privelige to serve you.  All the best for 2017!  It is important that you exercise regularly at least 30 minutes 5 times a week. If you develop chest pain, have severe difficulty breathing, or feel very tired, stop exercising immediately and seek medical attention

## 2015-08-24 NOTE — Progress Notes (Signed)
Subjective:    Patient ID: Sabrina [name], female    DOB: [birth year], 55 y.o.   MRN: [id number]  HPI   Sabrina [name]     MRN: [id number]      DOB: 1961/05/20   HPI [name] is here for follow up and re-evaluation of chronic medical conditions, medication management and review of any available recent lab and radiology data.  Preventive health is updated, specifically  Cancer screening and Immunization.   Questions or concerns regarding consultations or procedures which the PT has had in the interim are  Addressed.Having daily nausea and vomit still , following bariatric surgery approx 8 weeks ago   ROS Denies recent fever or chills. Denies sinus pressure, nasal congestion, ear pain or sore throat. Denies chest congestion, productive cough or wheezing. Denies chest pains, palpitations and leg swelling. C/o light headedness with change in postiton,eg when  Standing needs to change position slowly .   Denies dysuria, frequency, hesitancy or incontinence. Denies joint pain, swelling and limitation in mobility. Denies headaches, seizures, numbness, or tingling. Denies depression, anxiety or insomnia. Denies skin break down or rash.   PE  BP 96/68 mmHg  Pulse 84  Resp 18  Ht 5' (1.524 m)  Wt 221 lb (100.245 kg)  BMI 43.16 kg/m2  SpO2 97%  LMP 10/31/2002  Patient alert and oriented and in no cardiopulmonary distress.  HEENT: No facial asymmetry, EOMI,   oropharynx pink and moist.  Neck supple no JVD, no mass.  Chest: Clear to auscultation bilaterally.  CVS: S1, S2 no murmurs, no S3.Regular rate.  ABD: Soft non tender.   Ext: No edema  MS: Adequate ROM spine, shoulders, hips and knees.  Skin: Intact, no ulcerations or rash noted.  Psych: Good eye contact, normal affect. Memory intact not anxious or depressed appearing.  CNS: CN 2-12 intact, power,  normal throughout.no focal deficits noted.   Assessment & Plan   Essential hypertension Need  to discontinue medication, hypotensive and symptomatic with weight loss also renal insufficiency , which is new. Pt reprots daily nausea and vomit with poor intake Recheck BP in 1 then 4 weeks DASH diet and commitment to daily physical activity for a minimum of 30 minutes discussed and encouraged, as a part of hypertension management. The importance of attaining a healthy weight is also discussed.  BP/Weight 08/24/2015 07/19/2015 07/07/2015 07/05/2015 06/30/2015 06/20/2015 49/44/9675  Systolic BP 96 - 916 - 384 - 665  Diastolic BP 68 - 82 - 53 - 80  Wt. (Lbs) 221 231 262.79 - 247.31 249.5 254  BMI 43.16 45.11 - 51.32 48.3 48.73 49.61   Rept chem 7 and EGFR in 4 weeks Encourage 64 ounces water daily     Nausea with vomiting Requires daily medication for symptom management s/p gastric bypass approx 6 weeks ago  Diabetes mellitus with renal manifestation (HCC) Both metformin and invokana were d/c by surgeon 2 weeks ago per pt report , which  I absolutely agree with due to reduced intake and elevation in creatinine   Vitamin D deficiency Continue weekly supplement  Morbid obesity with BMI of 45.0-49.9, adult ([place]) Improved dramatically, 40 pound weight loss since 06/2015 when she had bariatric surgery    Weight /BMI 08/24/2015 07/19/2015 07/07/2015  WEIGHT 221 lb 231 lb 262 lb 12.6 oz  HEIGHT _0  _1  -  BMI 43.16 kg/m2 45.11 kg/m2 -    Current exercise per week 90 minutes.   Acute renal disease Significant increase in  creatinine with weight loss and reduced intake. Pt off diabetic med x 2weeks, antihypertensive discontinued today will rept chem 7 in 4 weeks. She is voiding normally but has very poor oral intake even of water. If not improved will need nephrology eval       Review of Systems     Objective:   Physical Exam        Assessment & Plan:

## 2015-08-24 NOTE — Assessment & Plan Note (Signed)
Requires daily medication for symptom management s/p gastric bypass approx 6 weeks ago

## 2015-08-31 ENCOUNTER — Ambulatory Visit: Payer: 59

## 2015-08-31 ENCOUNTER — Telehealth: Payer: Self-pay

## 2015-08-31 VITALS — BP 130/70

## 2015-08-31 DIAGNOSIS — I10 Essential (primary) hypertension: Secondary | ICD-10-CM

## 2015-08-31 NOTE — Telephone Encounter (Signed)
Patient aware.

## 2015-08-31 NOTE — Telephone Encounter (Signed)
No need and blood pressure excellent. She is barely able to drink a lot of water at any one time following surgery risk of dehydration for unnecessary "fluid pill"

## 2015-08-31 NOTE — Progress Notes (Signed)
Patient in for blood pressure check after being taken off blood pressure medication post bariatric surgery.  Blood pressure checked manually; within normal range.  Will keep next scheduled appointment and call with any concerns.

## 2015-09-02 ENCOUNTER — Ambulatory Visit: Payer: 59 | Admitting: Dietician

## 2015-09-15 ENCOUNTER — Other Ambulatory Visit (INDEPENDENT_AMBULATORY_CARE_PROVIDER_SITE_OTHER): Payer: Self-pay | Admitting: Physician Assistant

## 2015-09-15 DIAGNOSIS — Z9884 Bariatric surgery status: Secondary | ICD-10-CM

## 2015-09-19 ENCOUNTER — Ambulatory Visit
Admission: RE | Admit: 2015-09-19 | Discharge: 2015-09-19 | Disposition: A | Discharge status: Home / Self Care | Payer: 59 | Source: Ambulatory Visit | Attending: Physician Assistant | Admitting: Physician Assistant

## 2015-09-19 DIAGNOSIS — Z9884 Bariatric surgery status: Secondary | ICD-10-CM

## 2015-09-21 ENCOUNTER — Ambulatory Visit: Payer: 59

## 2015-09-21 VITALS — BP 114/70

## 2015-09-21 DIAGNOSIS — I10 Essential (primary) hypertension: Secondary | ICD-10-CM

## 2015-09-21 NOTE — Progress Notes (Signed)
Blood pressure checked manually.  Reading within normal range without medication. She will keep next scheduled ov.

## 2015-10-07 ENCOUNTER — Encounter (HOSPITAL_COMMUNITY): Payer: Self-pay | Admitting: *Deleted

## 2015-10-11 ENCOUNTER — Other Ambulatory Visit: Payer: Self-pay | Admitting: Gastroenterology

## 2015-10-14 ENCOUNTER — Encounter (HOSPITAL_COMMUNITY): Payer: Self-pay

## 2015-10-14 ENCOUNTER — Ambulatory Visit (HOSPITAL_COMMUNITY)
Admission: RE | Admit: 2015-10-14 | Discharge: 2015-10-14 | Disposition: A | Discharge status: Home / Self Care | Payer: 59 | Source: Ambulatory Visit | Attending: Gastroenterology | Admitting: Gastroenterology

## 2015-10-14 ENCOUNTER — Encounter (HOSPITAL_COMMUNITY)
Admission: RE | Disposition: A | Discharge status: Home / Self Care | Payer: Self-pay | Source: Ambulatory Visit | Attending: Gastroenterology

## 2015-10-14 ENCOUNTER — Ambulatory Visit (HOSPITAL_COMMUNITY): Payer: 59 | Admitting: Certified Registered Nurse Anesthetist

## 2015-10-14 DIAGNOSIS — G4730 Sleep apnea, unspecified: Secondary | ICD-10-CM | POA: Diagnosis not present

## 2015-10-14 DIAGNOSIS — I10 Essential (primary) hypertension: Secondary | ICD-10-CM | POA: Insufficient documentation

## 2015-10-14 DIAGNOSIS — E559 Vitamin D deficiency, unspecified: Secondary | ICD-10-CM | POA: Insufficient documentation

## 2015-10-14 DIAGNOSIS — R112 Nausea with vomiting, unspecified: Secondary | ICD-10-CM | POA: Insufficient documentation

## 2015-10-14 DIAGNOSIS — E7800 Pure hypercholesterolemia, unspecified: Secondary | ICD-10-CM | POA: Insufficient documentation

## 2015-10-14 DIAGNOSIS — Z79899 Other long term (current) drug therapy: Secondary | ICD-10-CM | POA: Diagnosis not present

## 2015-10-14 DIAGNOSIS — R1310 Dysphagia, unspecified: Secondary | ICD-10-CM | POA: Insufficient documentation

## 2015-10-14 DIAGNOSIS — Z9884 Bariatric surgery status: Secondary | ICD-10-CM | POA: Insufficient documentation

## 2015-10-14 DIAGNOSIS — R933 Abnormal findings on diagnostic imaging of other parts of digestive tract: Secondary | ICD-10-CM | POA: Insufficient documentation

## 2015-10-14 DIAGNOSIS — E119 Type 2 diabetes mellitus without complications: Secondary | ICD-10-CM | POA: Insufficient documentation

## 2015-10-14 HISTORY — PX: ESOPHAGOGASTRODUODENOSCOPY (EGD) WITH PROPOFOL: SHX5813

## 2015-10-14 SURGERY — ESOPHAGOGASTRODUODENOSCOPY (EGD) WITH PROPOFOL
Anesthesia: Monitor Anesthesia Care

## 2015-10-14 MED ORDER — SODIUM CHLORIDE 0.9 % IV SOLN: INTRAVENOUS | Status: DC

## 2015-10-14 MED ORDER — LACTATED RINGERS IV SOLN
INTRAVENOUS | Status: DC
  Administered 2015-10-14: 1000 mL via INTRAVENOUS

## 2015-10-14 MED ORDER — PROPOFOL 10 MG/ML IV BOLUS
INTRAVENOUS | Status: DC | PRN
  Administered 2015-10-14: 20 mg via INTRAVENOUS
  Administered 2015-10-14 (×3): 50 mg via INTRAVENOUS
  Administered 2015-10-14: 60 mg via INTRAVENOUS
  Administered 2015-10-14: 20 mg via INTRAVENOUS

## 2015-10-14 MED ORDER — PROPOFOL 10 MG/ML IV BOLUS
INTRAVENOUS | Status: AC
  Filled 2015-10-14: qty 40

## 2015-10-14 SURGICAL SUPPLY — 15 items

## 2015-10-14 NOTE — Op Note (Signed)
[facility] [place], [id number]   ENDOSCOPY PROCEDURE REPORT  PATIENT: Sabrina, [name]  MR#: [id number] BIRTHDATE: [birth year] , 71  yrs. old GENDER: female ENDOSCOPIST: Clarene Essex, MD REFERRED BY:  Greer Pickerel, M.D. PROCEDURE DATE:  10/14/2015 PROCEDURE:  EGD, diagnostic ASA CLASS:     Class II INDICATIONS:  nausea, vomiting, and dysphagia. MEDICATIONS: Propofol 250 mg IV TOPICAL ANESTHETIC: none  DESCRIPTION OF PROCEDURE: After the risks benefits and alternatives of the procedure were thoroughly explained, informed consent was obtained.  The EG [id number] ) endoscope was introduced through the mouth and advanced to the mid jejunum , Without limitations.  The instrument was slowly withdrawn as the mucosa was fully examined. Estimated blood loss is zero unless otherwise noted in this procedure report.    the findings are recorded below       Retroflexion was not performed.     The scope was then withdrawn from the patient and the procedure completed.  COMPLICATIONS: There were no immediate complications.  ENDOSCOPIC IMPRESSION: 1. No obvious esophageal stricture narrowing or compression 2. Gastric sleeve seemingly intact 3. Normal small bowel approximately 40 cm below pylorus  RECOMMENDATIONS: as per surgical team will discussed with them happy to see back when necessary  REPEAT EXAM: as needed  eSigned:  Clarene Essex, MD 10/14/2015 11:51 AM    [id number], Randall Hiss MD  CPT CODES: ICD CODES:  The ICD and CPT codes recommended by this software are interpretations from the data that the clinical staff has captured with the software.  The verification of the translation of this report to the ICD and CPT codes and modifiers is the sole responsibility of the health care institution and practicing physician where this report was generated.  [employer]. will not be held responsible for the validity of the ICD  and CPT codes included on this report.  AMA assumes no liability for data contained or not contained herein. CPT is a Designer, television/film set of the [employer].  PATIENT NAME:  Sabrina, [name] MR#: [id number]

## 2015-10-14 NOTE — Progress Notes (Signed)
Sabrina [name] 11:24 AM  Subjective: Patient with no new symptoms since I saw her in the office recently  Objective: Vital signs stable afebrile no acute distress exam please see preassessment evaluation  Assessment: Solid nausea vomiting and dysphagia status post gastric sleeve  Plan: Okay to proceed with endoscopy and possible dilation with anesthesia assistance  [facility]  Pager [phone] After 5PM or if no answer call [phone]

## 2015-10-14 NOTE — Anesthesia Postprocedure Evaluation (Signed)
Anesthesia Post Note  Patient: Sabrina [name] [facility]  Procedure(s) Performed: Procedure(s) (LRB): ESOPHAGOGASTRODUODENOSCOPY (EGD) WITH PROPOFOL ([name]) SAVORY DILATION ([name]) BALLOON DILATION ([name])  Patient location during evaluation: Endoscopy Anesthesia Type: MAC Level of consciousness: awake and alert Pain management: pain level controlled Vital Signs Assessment: post-procedure vital signs reviewed and stable Respiratory status: spontaneous breathing, nonlabored ventilation, respiratory function stable and patient connected to nasal cannula oxygen Cardiovascular status: stable and blood pressure returned to baseline Anesthetic complications: no    Last Vitals:  Filed Vitals:   10/14/15 1150 10/14/15 1200  BP: 87/43 102/61  Pulse: 86 76  Temp:    Resp: 21 16    Last Pain: There were no vitals filed for this visit.               Catalina Gravel

## 2015-10-14 NOTE — Discharge Instructions (Addendum)
Esophagogastroduodenoscopy, Care After Refer to this sheet in the next few weeks. These instructions provide you with information about caring for yourself after your procedure. Your health care provider may also give you more specific instructions. Your treatment has been planned according to current medical practices, but problems sometimes occur. Call your health care provider if you have any problems or questions after your procedure. WHAT TO EXPECT AFTER THE PROCEDURE After your procedure, it is typical to feel:  Soreness in your throat.  Pain with swallowing.  Sick to your stomach (nauseous).  Bloated.  Dizzy.  Fatigued. HOME CARE INSTRUCTIONS  Do not eat or drink anything until the numbing medicine (local anesthetic) has worn off and your gag reflex has returned. You will know that the local anesthetic has worn off when you can swallow comfortably.  Do not drive or operate machinery until directed by your health care provider.  Take medicines only as directed by your health care provider. SEEK MEDICAL CARE IF:   You cannot stop coughing.  You are not urinating at all or less than usual. SEEK IMMEDIATE MEDICAL CARE IF:  You have difficulty swallowing.  You cannot eat or drink.  You have worsening throat or chest pain.  You have dizziness or lightheadedness or you faint.  You have nausea or vomiting.  You have chills.  You have a fever.  You have severe abdominal pain.  You have black, tarry, or bloody stools.   This information is not intended to replace advice given to you by your health care provider. Make sure you discuss any questions you have with your health care provider.   Document Released: 07/16/2012 Document Revised: 08/20/2014 Document Reviewed: 07/16/2012 Elsevier Interactive Patient Education [employer].   Call if question or problem otherwise follow-up with Dr. Redmond Pulling next week to discuss possible options\

## 2015-10-14 NOTE — Transfer of Care (Signed)
Immediate Anesthesia Transfer of Care Note  Patient: Sabrina [name]  Procedure(s) Performed: Procedure(s): ESOPHAGOGASTRODUODENOSCOPY (EGD) WITH PROPOFOL ([name]) SAVORY DILATION ([name]) BALLOON DILATION ([name])  Patient Location: PACU  Anesthesia Type:MAC  Level of Consciousness: awake, alert  and oriented  Airway & Oxygen Therapy: Patient Spontanous Breathing and Patient connected to nasal cannula oxygen  Post-op Assessment: Report given to RN and Post -op Vital signs reviewed and stable  Post vital signs: Reviewed and stable  Last Vitals:  Filed Vitals:   10/14/15 1037 10/14/15 1146  BP: 130/79   Pulse: 66 87  Temp: 37.1 C 36.4 C  Resp: 21 23    Complications: No apparent anesthesia complications

## 2015-10-14 NOTE — Anesthesia Preprocedure Evaluation (Addendum)
Anesthesia Evaluation  Patient identified by MRN, date of birth, ID band Patient awake    Reviewed: Allergy & Precautions, NPO status , Patient's Chart, lab work & pertinent test results  Airway Mallampati: II  TM Distance: >3 FB Neck ROM: Full    Dental  (+) Teeth Intact, Dental Advisory Given   Pulmonary asthma , sleep apnea ,    Pulmonary exam normal breath sounds clear to auscultation       Cardiovascular hypertension, Normal cardiovascular exam Rhythm:Regular Rate:Normal  Echo 01/2015: Study Conclusions  - Left ventricle: The cavity size was normal. Wall thickness wasnormal. Systolic function was normal. The estimated ejectionfraction was in the range of 60% to 65%. Wall motion was normal;there were no regional wall motion abnormalities.    Neuro/Psych  Headaches, negative psych ROS   GI/Hepatic Neg liver ROS, GERD  Medicated,S/p sleeve gastrectomy   Endo/Other  diabetesHyperthyroidism Morbid obesityLupus   Renal/GU Renal InsufficiencyRenal disease     Musculoskeletal  (+) Arthritis , Osteoarthritis,    Abdominal   Peds  Hematology negative hematology ROS (+)   Anesthesia Other Findings Day of surgery medications reviewed with the patient.  Reproductive/Obstetrics                           Anesthesia Physical Anesthesia Plan  ASA: III  Anesthesia Plan: MAC   Post-op Pain Management:    Induction: Intravenous  Airway Management Planned: Nasal Cannula  Additional Equipment:   Intra-op Plan:   Post-operative Plan:   Informed Consent: I have reviewed the patients History and Physical, chart, labs and discussed the procedure including the risks, benefits and alternatives for the proposed anesthesia with the patient or authorized representative who has indicated his/her understanding and acceptance.   Dental advisory given  Plan Discussed with: CRNA and  Anesthesiologist  Anesthesia Plan Comments: (Discussed risks/benefits/alternatives to MAC sedation including need for ventilatory support, hypotension, need for conversion to general anesthesia.  All patient questions answered.  Patient wished to proceed.)        Anesthesia Quick Evaluation

## 2015-10-17 ENCOUNTER — Encounter (HOSPITAL_COMMUNITY): Payer: Self-pay | Admitting: Gastroenterology

## 2015-10-18 ENCOUNTER — Ambulatory Visit (INDEPENDENT_AMBULATORY_CARE_PROVIDER_SITE_OTHER): Payer: 59 | Admitting: Family Medicine

## 2015-10-18 ENCOUNTER — Encounter: Payer: Self-pay | Admitting: Family Medicine

## 2015-10-18 VITALS — BP 128/80 | HR 91 | Resp 16 | Ht 60.0 in | Wt 212.1 lb

## 2015-10-18 DIAGNOSIS — E1129 Type 2 diabetes mellitus with other diabetic kidney complication: Secondary | ICD-10-CM

## 2015-10-18 DIAGNOSIS — E559 Vitamin D deficiency, unspecified: Secondary | ICD-10-CM

## 2015-10-18 DIAGNOSIS — R110 Nausea: Secondary | ICD-10-CM | POA: Insufficient documentation

## 2015-10-18 DIAGNOSIS — L409 Psoriasis, unspecified: Secondary | ICD-10-CM | POA: Insufficient documentation

## 2015-10-18 DIAGNOSIS — Z6841 Body Mass Index (BMI) 40.0 and over, adult: Secondary | ICD-10-CM

## 2015-10-18 DIAGNOSIS — E8881 Metabolic syndrome: Secondary | ICD-10-CM

## 2015-10-18 DIAGNOSIS — I10 Essential (primary) hypertension: Secondary | ICD-10-CM

## 2015-10-18 NOTE — Assessment & Plan Note (Signed)
Medication discontinued following bariatric surgery with successful weight loss Sabrina [name] is reminded of the importance of commitment to daily physical activity for 30 minutes or more, as able and the need to limit carbohydrate intake to 30 to 60 grams per meal to help with blood sugar control.   TMs. Allbee is reminded of the importance of daily foot exam, annual eye examination, and good blood sugar, blood pressure and cholesterol control.  Diabetic Labs Latest Ref Rng 08/22/2015 07/06/2015 07/05/2015 06/30/2015 05/06/2015  HbA1c <5.7 % 6.2(H) - - - -  Microalbumin <2.0 mg/dL - - - - -  Micro/Creat Ratio 0.0 - 30.0 mg/g - - - - -  Chol 0 - 200 mg/dL - - - - -  HDL >=46 mg/dL - - - - -  Calc LDL 0 - 99 mg/dL - - - - -  Triglycerides <150 mg/dL - - - - -  Creatinine 0.50 - 1.05 mg/dL 1.60(H) 1.21(H) 1.44(H) 1.41(H) 1.09(H)   BP/Weight 10/18/2015 10/14/2015 09/21/2015 08/31/2015 08/24/2015 07/19/2015 [id number]  Systolic BP 0000000 [id number] 96 - 0000000  Diastolic BP 80 61 70 70 68 - 82  Wt. (Lbs) 212.12 - - - 221 231 262.79  BMI 41.43 - - - 43.16 45.11 -   Foot/eye exam completion dates Latest Ref Rng 10/18/2015 09/15/2014  Eye Exam No Retinopathy - No Retinopathy  Foot exam Order - - -  Foot Form Completion - Done -

## 2015-10-18 NOTE — Patient Instructions (Signed)
F/u early August , call if you need me sooner  Thankful you are feeling better   Non fast hBA1C, chem7 and EGFR and Vit D  April 10 or shortly after  Fasting lipid, cbc, HBA1C chem7 and EGFR Last week in July/ early August  Pls sign for office note from rheumatologist in [place] exam is good

## 2015-10-18 NOTE — Assessment & Plan Note (Signed)
Improved. Pt applauded on succesful weight loss through lifestyle change, and encouraged to continue same. Weight loss goal set for the next several months.  

## 2015-10-18 NOTE — Assessment & Plan Note (Signed)
reports flare of skin disease, requests topical steroid refll that prior rheumatologist had been prescribing, she is to  Call back with info

## 2015-10-18 NOTE — Progress Notes (Signed)
Subjective:    Patient ID: Sabrina [name], female    DOB: 04/26/1961, 55 y.o.   MRN: [id number]  HPI   Sabrina [name]     MRN: [id number]      DOB: 15-Oct-1960   HPI Sabrina [name] is here for follow up and re-evaluation of chronic medical conditions, medication management and review of any available recent lab and radiology data.  Preventive health is updated, specifically  Cancer screening and Immunization.   Questions or concerns regarding consultations or procedures which the PT has had in the interim are  Addressed.Just had upper endo to address nausea, nothing abnormal discovered The PT denies any adverse reactions to current medications since the last visit.  C/o increased skin rash and dryness, wants cream re sent which has worked in the past  ROS Denies recent fever or chills. Denies sinus pressure, nasal congestion, ear pain or sore throat. Denies chest congestion, productive cough or wheezing. Denies chest pains, palpitations and leg swelling Denies abdominal pain, nausea, vomiting,diarrhea or constipation.   Denies dysuria, frequency, hesitancy or incontinence. Denies joint pain, swelling and limitation in mobility. Denies headaches, seizures, numbness, or tingling. Denies depression, anxiety or insomnia.    PE  BP 128/80 mmHg  Pulse 91  Resp 16  Ht 5' (1.524 m)  Wt 212 lb 1.9 oz (96.217 kg)  BMI 41.43 kg/m2  SpO2 97%  LMP 10/31/2002  Patient alert and oriented and in no cardiopulmonary distress.  HEENT: No facial asymmetry, EOMI,   oropharynx pink and moist.  Neck supple no JVD, no mass.  Chest: Clear to auscultation bilaterally.  CVS: S1, S2 no murmurs, no S3.Regular rate.  ABD: Soft non tender.   Ext: No edema  MS: Adequate ROM spine, shoulders, hips and knees.  Skin:  rash noted.  Psych: Good eye contact, normal affect. Memory intact not anxious or depressed appearing.  CNS: CN 2-12 intact, power,  normal throughout.no focal  deficits noted.   Assessment & Plan   Essential hypertension Resolved following bariatric surgery  Diabetes mellitus with renal manifestation (HCC) Medication discontinued following bariatric surgery with successful weight loss Sabrina [name] is reminded of the importance of commitment to daily physical activity for 30 minutes or more, as able and the need to limit carbohydrate intake to 30 to 60 grams per meal to help with blood sugar control.   Sabrina [name] is reminded of the importance of daily foot exam, annual eye examination, and good blood sugar, blood pressure and cholesterol control.  Diabetic Labs Latest Ref Rng 08/22/2015 07/06/2015 07/05/2015 06/30/2015 05/06/2015  HbA1c <5.7 % 6.2(H) - - - -  Microalbumin <2.0 mg/dL - - - - -  Micro/Creat Ratio 0.0 - 30.0 mg/g - - - - -  Chol 0 - 200 mg/dL - - - - -  HDL >=46 mg/dL - - - - -  Calc LDL 0 - 99 mg/dL - - - - -  Triglycerides <150 mg/dL - - - - -  Creatinine 0.50 - 1.05 mg/dL 1.60(H) 1.21(H) 1.44(H) 1.41(H) 1.09(H)   BP/Weight 10/18/2015 10/14/2015 09/21/2015 08/31/2015 08/24/2015 07/19/2015 [id number]  Systolic BP 0000000 [id number] 96 - 0000000  Diastolic BP 80 61 70 70 68 - 82  Wt. (Lbs) 212.12 - - - 221 231 262.79  BMI 41.43 - - - 43.16 45.11 -   Foot/eye exam completion dates Latest Ref Rng 10/18/2015 09/15/2014  Eye Exam No Retinopathy - No Retinopathy  Foot exam Order - - -  Foot Form Completion - Done -         Morbid obesity with BMI of 45.0-49.9, adult ([place]) Improved. Pt applauded on succesful weight loss through lifestyle change, and encouraged to continue same. Weight loss goal set for the next several months.   Nausea without vomiting improved  Psoriasis reports flare of skin disease, requests topical steroid refll that prior rheumatologist had been prescribing, she is to  Call back with info      Review of Systems     Objective:   Physical Exam        Assessment & Plan:

## 2015-10-18 NOTE — Assessment & Plan Note (Signed)
improved

## 2015-10-18 NOTE — Assessment & Plan Note (Signed)
Resolved following bariatric surgery

## 2015-10-19 LAB — MICROALBUMIN / CREATININE URINE RATIO
Creatinine, Urine: 277 mg/dL (ref 20–320)
MICROALB UR: 2.7 mg/dL
MICROALB/CREAT RATIO: 10 ug/mg{creat} (ref <30)

## 2015-10-21 ENCOUNTER — Encounter: Payer: 59 | Attending: General Surgery | Admitting: Dietician

## 2015-10-21 ENCOUNTER — Encounter: Payer: Self-pay | Admitting: Dietician

## 2015-10-21 DIAGNOSIS — Z713 Dietary counseling and surveillance: Secondary | ICD-10-CM | POA: Diagnosis not present

## 2015-10-21 DIAGNOSIS — Z6841 Body Mass Index (BMI) 40.0 and over, adult: Secondary | ICD-10-CM | POA: Diagnosis not present

## 2015-10-21 NOTE — Progress Notes (Signed)
  Follow-up visit:  3.5 Months Post-Operative Sleeve gastrectomy Surgery  Medical Nutrition Therapy:  Appt start time: [id number] end time:  0920.  Primary concerns today: Post-operative Bariatric Surgery Nutrition Management.  Sabrina [name] returns having lost a total of 39 lbs. She reports having trouble swallowing that results in nausea and vomiting. Had an endoscopy and reports that the findings were normal. When she takes Zofran and her husband's pantoprazole in the morning, this helps her not to vomit that day. Feels burning occasionally. Feels a stuck feeling within 30 seconds of eating and will not feel better until she vomits.  Cannot tolerate eggs or tuna. Has not added veggies.   Surgery date: 07/05/15 Surgery type: Sleeve gastrectomy [facility] weight at [facility]: 251.5 lbs on 12/31/14 Weight today: 212.5 lbs Weight change: 18.5 lbs (31.5 lbs fat) Total weight lost: 39 lbs  Goal weight: 175 lbs  TANITA  BODY COMP RESULTS  06/20/15 07/19/15 10/21/15   BMI (kg/m^2) 48.7 45.1 41.5   Fat Mass (lbs) 135.5 118.0 86.5   Fat Free Mass (lbs) 114 113.0 126   Total Body Water (lbs) 83.5 82.5 92    Preferred Learning Style:   No preference indicated   Learning Readiness:   Ready  24-hr recall: B (AM): EAS protein shake (17g) Snk (10:30 AM): peanut butter  (5g) L (1:30-2PM): 3 oz chicken or [place] (21g) Snk (PM): sugar free jello  D (PM): 3 oz baked chicken or [place] (21g) Snk (PM): sugar free popsicle  Fluid intake: 50 oz (protein shake, water with sugar free flavor) Estimated total protein intake: 64g/day  Medications: no longer taking blood pressure or diabetes medications Supplementation: taking  CBG monitoring: no longer testing Last patient reported A1c: none since surgery  Using straws: yes, no problems reported Drinking while eating: no Hair loss: yes Carbonated beverages: none N/V/D/C: constipation, resolving with Miralax  Dumping syndrome: none  Recent physical activity:   Walking 3x a week for 30 minutes  Progress Towards Goal(s):  In progress.  Handouts given during visit include:  Phase 3B lean protein + non starchy vegetables   Nutritional Diagnosis:  -3.3 Overweight/obesity related to past poor dietary habits and physical inactivity as evidenced by patient w/ recent sleeve gastrectomy surgery following dietary guidelines for continued weight loss.     Intervention:  Nutrition counseling provided.  Teaching Method Utilized:  Visual Auditory Hands on  Barriers to learning/adherence to lifestyle change: none  Demonstrated degree of understanding via:  Teach Back   Monitoring/Evaluation:  Dietary intake, exercise, and body weight. Follow up in 6 months for 9 month post-op visit.

## 2015-10-21 NOTE — Patient Instructions (Addendum)
Goals:  Follow Phase 3B: High Protein + Non-Starchy Vegetables  Eat 3-6 small meals/snacks, every 3-5 hrs  Increase lean protein foods to meet 60g goal  Increase fluid intake to 64oz +  Avoid drinking 15 minutes before, during and 30 minutes after eating  Aim for >30 min of physical activity daily  Make sure to keep chewing well, eating slowly, and taking tiny bites  Reduce peanut butter if you start to see your weight loss slow  Call [facility] about nausea and vomiting  Surgery date: 07/05/15 Surgery type: Sleeve gastrectomy Start weight at [facility]: 251.5 lbs on 12/31/14 Weight today: 212.5 lbs Weight change: 18.5 lbs (31.5 lbs fat) Total weight lost: 39 lbs  Goal weight: 175 lbs  TANITA  BODY COMP RESULTS  06/20/15 07/19/15 10/21/15   BMI (kg/m^2) 48.7 45.1 41.5   Fat Mass (lbs) 135.5 118.0 86.5   Fat Free Mass (lbs) 114 113.0 126   Total Body Water (lbs) 83.5 82.5 92

## 2015-11-04 ENCOUNTER — Telehealth: Payer: Self-pay | Admitting: Family Medicine

## 2015-11-04 ENCOUNTER — Other Ambulatory Visit: Payer: Self-pay

## 2015-11-04 MED ORDER — LEVOFLOXACIN 500 MG PO TABS: 500.0000 mg | ORAL_TABLET | Freq: Every day | ORAL | Status: DC

## 2015-11-04 MED ORDER — PROMETHAZINE-DM 6.25-15 MG/5ML PO SYRP: 5.0000 mL | ORAL_SOLUTION | Freq: Every evening | ORAL | Status: DC | PRN

## 2015-11-04 MED ORDER — BENZONATATE 100 MG PO CAPS: 100.0000 mg | ORAL_CAPSULE | Freq: Two times a day (BID) | ORAL | Status: DC | PRN

## 2015-11-04 NOTE — Telephone Encounter (Signed)
Levaquin sent, advise we would have no antibiotic "shot " available as allergic to PCN Advise tylenol , fluids , rest, let her knwo med is sent May usee sugar free robitussin DM  Also  tessalo [perles also sent Pls send phenergan dm bedtime only , if she wishes

## 2015-11-04 NOTE — Telephone Encounter (Signed)
Coughing and congested x's 2 days, fever & chills, asking to come in and get a shot she has a wedding to go to tomorrow

## 2015-11-04 NOTE — Telephone Encounter (Signed)
Pt aware.

## 2015-11-04 NOTE — Telephone Encounter (Signed)
Started yesterday after lunch- coughing up clear mucus, sinus congestion, fever of 101, bodyaches and chills. Wants something called in because she has a wedding to go to tomorrow. Please advise

## 2015-12-29 ENCOUNTER — Other Ambulatory Visit: Payer: Self-pay | Admitting: Family Medicine

## 2015-12-30 ENCOUNTER — Other Ambulatory Visit: Payer: Self-pay | Admitting: Family Medicine

## 2016-01-19 ENCOUNTER — Other Ambulatory Visit: Payer: Self-pay | Admitting: Family Medicine

## 2016-01-31 ENCOUNTER — Encounter: Payer: Self-pay | Admitting: Certified Nurse Midwife

## 2016-01-31 ENCOUNTER — Ambulatory Visit (INDEPENDENT_AMBULATORY_CARE_PROVIDER_SITE_OTHER): Payer: 59 | Admitting: Certified Nurse Midwife

## 2016-01-31 VITALS — BP 128/82 | HR 72 | Resp 16 | Ht 59.75 in | Wt 210.0 lb

## 2016-01-31 DIAGNOSIS — Z124 Encounter for screening for malignant neoplasm of cervix: Secondary | ICD-10-CM

## 2016-01-31 DIAGNOSIS — Z01419 Encounter for gynecological examination (general) (routine) without abnormal findings: Secondary | ICD-10-CM

## 2016-01-31 NOTE — Patient Instructions (Signed)

## 2016-01-31 NOTE — Progress Notes (Signed)
55 y.o. G0P0000 Married  African American Fe here for annual exam. Menopausal no HRT. Denies vaginal bleeding or vaginal dryness.  Sees Dr. Moshe Cipro for management of Diabetes Type 2/hypertension and labs. Is not on medication for either now due to weight loss, but monitoring closely.. Had GI sleeve surgery 11/16 and feels so much better with 43 pound weight loss. Had nausea and vomiting initially after surgery,but now down to 1-2 times monthly. Continues to have heat rash under abdomen.. Has tried powder with some results and vaseline. Occurs with or without sweating. Plans to see Dermatology for, with follow up of discoloration of bilateral lower legs. Has had biopsy of both and unknown cause as of yet.  No other health issues today. Plans [facility] evaluation this summer.   Patient's last menstrual period was 10/31/2002.          Sexually active: Yes.    The current method of family planning is status post hysterectomy.    Exercising: Yes.    walking 3 times a week Smoker:  no  Health Maintenance: Pap: 11-12-13 neg MMG: 03-14-15 category b density, birads 1:neg Colonoscopy:  2014 polyps f/u 57yrs  BMD:   2006 normal TDaP:  2016 Shingles: no Pneumonia: 2016 Hep C and HIV: both neg 2016 Labs: pcp Self breast exam: done occ   reports that she has never smoked. She has never used smokeless tobacco. She reports that she does not drink alcohol or use illicit drugs.  Past Medical History  Diagnosis Date  . Thyroid disease   . Hypertension   . Allergy   . Diabetes mellitus   . Vitamin D deficiency   . Obesity   . Secondary hyperthyroidism   . Hypersomnia with sleep apnea   . Arthritis   . Chronic bronchitis ([place])   . Fibroid uterus   . Menorrhagia   . Hx of iron deficiency anemia   . Endometriosis   . Lupus ([place]) 07/2013    Of Skin  . Complication of anesthesia     awaken during oral surgery   . Shortness of breath dyspnea     with asthma   . Asthma   . Pneumonia   . Tinnitus     left  ear  . Vertigo   . GERD (gastroesophageal reflux disease)   . History of hiatal hernia   . Anemia     Past Surgical History  Procedure Laterality Date  . Abdominal hysterectomy  2004    supracervical hysterectomy  . Breast surgery  2005    reduction  . Dilation and curettage of uterus      age 26  . Breath tek h pylori [name] 12/24/2014    Procedure: BREATH TEK H PYLORI;  Surgeon: Greer Pickerel, MD;  Location: [facility];  Service: General;  Laterality: [name];  . Laparoscopic gastric sleeve resection with hiatal hernia repair [name] 07/05/2015    Procedure: LAPAROSCOPIC GASTRIC SLEEVE RESECTION WITH HIATAL HERNIA REPAIR, upper endoscopy;  Surgeon: Greer Pickerel, MD;  Location: [facility];  Service: General;  Laterality: [name];  . Esophagogastroduodenoscopy (egd) with propofol [name] 10/14/2015    Procedure: ESOPHAGOGASTRODUODENOSCOPY (EGD) WITH PROPOFOL;  Surgeon: Clarene Essex, MD;  Location: [facility];  Service: Endoscopy;  Laterality: [name];    Current Outpatient Prescriptions  Medication Sig Dispense Refill  . ergocalciferol (VITAMIN D2) 50000 units capsule Take 1 capsule (50,000 Units total) by mouth once a week. One capsule once weekly (Patient taking differently: Take 50,000 Units by mouth once a week.  Thursday) 4 capsule 11  . folic acid (FOLVITE) 1 MG tablet Take 1 mg by mouth daily.    . metoCLOPramide (REGLAN) 10 MG tablet Take 10 mg by mouth as needed.  0  . Multiple Vitamin (MULTIVITAMIN WITH MINERALS) TABS tablet Take 1 tablet by mouth daily.    . ondansetron (ZOFRAN ODT) 4 MG disintegrating tablet Take 1 tablet (4 mg total) by mouth every 8 (eight) hours as needed for nausea or vomiting. 30 tablet 4  . ondansetron (ZOFRAN) 4 MG tablet Take 4 mg by mouth every 8 (eight) hours as needed. for nausea  1  . pantoprazole (PROTONIX) 40 MG tablet     . triamcinolone ointment (KENALOG) 0.1 % APPLY TO ITCHING SPOTS ON THE BODY DAILY TO TWICE DAILY AS NEEDED. NOT TO FACE. 454 g 0   No current  facility-administered medications for this visit.    Family History  Problem Relation Age of Onset  . Hypertension Mother   . Diabetes Mother   . Arthritis Mother   . Depression Mother   . Diabetes Father   . Stroke Father   . Arthritis Father   . Depression Father   . Hypertension Father   . Cancer Father     prostate  . Hypertension Sister     x 2  . Breast cancer Sister   . Arthritis Sister   . Bleeding Disorder Sister   . Depression Sister   . Thyroid disease Sister   . Stroke Maternal Aunt   . Colon cancer Maternal Grandfather     ROS:  Pertinent items are noted in HPI.  Otherwise, a comprehensive ROS was negative.  Exam:   BP 128/82 mmHg  Pulse 72  Resp 16  Ht 4' 11.75" (1.518 m)  Wt 210 lb (95.255 kg)  BMI 41.34 kg/m2  LMP 10/31/2002 Height: 4' 11.75" (151.8 cm) Ht Readings from Last 3 Encounters:  01/31/16 4' 11.75" (1.518 m)  10/21/15 5' (1.524 m)  10/18/15 5' (1.524 m)    General appearance: alert, cooperative and appears stated age Head: Normocephalic, without obvious abnormality, atraumatic Neck: no adenopathy, supple, symmetrical, trachea midline and thyroid normal to inspection and palpation Lungs: clear to auscultation bilaterally Breasts: normal appearance, no masses or tenderness, No nipple retraction or dimpling, No nipple discharge or bleeding, No axillary or supraclavicular adenopathy, scarring from reduction bilateral Heart: regular rate and rhythm Abdomen: soft, non-tender; no masses,  no organomegaly Extremities: extremities normal, atraumatic, no cyanosis or edema Skin: Skin color, texture, turgor normal. No rashes or lesions Lymph nodes: Cervical, supraclavicular, and axillary nodes normal. No abnormal inguinal nodes palpated Neurologic: Grossly normal   Pelvic: External genitalia:  no lesions              Urethra:  normal appearing urethra with no masses, tenderness or lesions              Bartholin's and Skene's: normal                  Vagina: normal appearing vagina with normal color and discharge, no lesions              Cervix: no cervical motion tenderness, no lesions and normal appearance              Pap taken: Yes.   Bimanual Exam:  Uterus:  uterus absent              Adnexa: no mass, fullness, tenderness  Rectovaginal: Confirms               Anus:  normal sphincter tone, no lesions  Chaperone present: yes  A:  Well Woman with normal exam  Menopausal no HRT, s/p supracervical TAH for fibroids and menorrhagia  Type 2 Diabetes/hypertension with PCP management, no meds now due to Gastric sleeve surgery in 2016.  Lupus dermatitis with MD follow up at [facility]  Rash under abdomen, no change with yeast treatment, following up with Dermatology  P:   Reviewed health and wellness pertinent to exam  Aware of need to advise if vaginal bleeding  Continue follow up with MD's as indicattive  Pap smear as above with HPVHR   counseled on breast self exam, mammography screening, adequate intake of calcium and vitamin D, diet and exercise return annually or prn  An After Visit Summary was printed and given to the patient.

## 2016-02-01 NOTE — Progress Notes (Signed)
Reviewed personally.  M. Suzanne Anelia Carriveau, MD.  

## 2016-02-02 LAB — IPS PAP TEST WITH HPV

## 2016-03-10 LAB — CBC WITH DIFFERENTIAL/PLATELET
Basophils Absolute: 0 cells/uL (ref 0–200)
Basophils Relative: 0 %
EOS ABS: 505 {cells}/uL — AB (ref 15–500)
Eosinophils Relative: 5 %
HEMATOCRIT: 36.4 % (ref 35.0–45.0)
Hemoglobin: 12.1 g/dL (ref 11.7–15.5)
LYMPHS PCT: 29 %
Lymphs Abs: 2929 cells/uL (ref 850–3900)
MCH: 27.1 pg (ref 27.0–33.0)
MCHC: 33.2 g/dL (ref 32.0–36.0)
MCV: 81.4 fL (ref 80.0–100.0)
MONO ABS: 808 {cells}/uL (ref 200–950)
MONOS PCT: 8 %
MPV: 10.6 fL (ref 7.5–12.5)
NEUTROS PCT: 58 %
Neutro Abs: 5858 cells/uL (ref 1500–7800)
PLATELETS: 352 10*3/uL (ref 140–400)
RBC: 4.47 MIL/uL (ref 3.80–5.10)
RDW: 15.3 % — AB (ref 11.0–15.0)
WBC: 10.1 10*3/uL (ref 3.8–10.8)

## 2016-03-10 LAB — BASIC METABOLIC PANEL WITH GFR
BUN: 19 mg/dL (ref 7–25)
CALCIUM: 7.5 mg/dL — AB (ref 8.6–10.4)
CHLORIDE: 103 mmol/L (ref 98–110)
CO2: 30 mmol/L (ref 20–31)
CREATININE: 1.1 mg/dL — AB (ref 0.50–1.05)
GFR, Est African American: 67 mL/min (ref >=60)
GFR, Est Non African American: 58 mL/min — ABNORMAL LOW (ref >=60)
Glucose, Bld: 92 mg/dL (ref 65–99)
Potassium: 3.6 mmol/L (ref 3.5–5.3)
Sodium: 143 mmol/L (ref 135–146)

## 2016-03-10 LAB — LIPID PANEL
CHOL/HDL RATIO: 3.5 ratio (ref <=5.0)
Cholesterol: 111 mg/dL — ABNORMAL LOW (ref 125–200)
HDL: 32 mg/dL — AB (ref >=46)
LDL Cholesterol: 62 mg/dL (ref <130)
Triglycerides: 83 mg/dL (ref <150)
VLDL: 17 mg/dL (ref <30)

## 2016-03-11 LAB — HEMOGLOBIN A1C
Hgb A1c MFr Bld: 6.3 % — ABNORMAL HIGH (ref <5.7)
Mean Plasma Glucose: 134 mg/dL

## 2016-03-14 ENCOUNTER — Encounter: Payer: Self-pay | Admitting: Family Medicine

## 2016-03-14 ENCOUNTER — Ambulatory Visit (INDEPENDENT_AMBULATORY_CARE_PROVIDER_SITE_OTHER): Payer: 59 | Admitting: Family Medicine

## 2016-03-14 ENCOUNTER — Other Ambulatory Visit: Payer: Self-pay

## 2016-03-14 VITALS — BP 148/88 | HR 103 | Resp 16 | Ht 60.0 in | Wt 214.0 lb

## 2016-03-14 DIAGNOSIS — Z6841 Body Mass Index (BMI) 40.0 and over, adult: Secondary | ICD-10-CM

## 2016-03-14 DIAGNOSIS — I509 Heart failure, unspecified: Secondary | ICD-10-CM | POA: Insufficient documentation

## 2016-03-14 DIAGNOSIS — R21 Rash and other nonspecific skin eruption: Secondary | ICD-10-CM | POA: Diagnosis not present

## 2016-03-14 DIAGNOSIS — R110 Nausea: Secondary | ICD-10-CM | POA: Diagnosis not present

## 2016-03-14 DIAGNOSIS — I10 Essential (primary) hypertension: Secondary | ICD-10-CM

## 2016-03-14 DIAGNOSIS — L270 Generalized skin eruption due to drugs and medicaments taken internally: Secondary | ICD-10-CM | POA: Insufficient documentation

## 2016-03-14 MED ORDER — TRIAMTERENE-HCTZ 37.5-25 MG PO TABS: ORAL_TABLET | ORAL | 2 refills | Status: DC

## 2016-03-14 MED ORDER — ERGOCALCIFEROL 1.25 MG (50000 UT) PO CAPS
50000.0000 [IU] | ORAL_CAPSULE | ORAL | 11 refills | Status: DC
Start: 2016-03-14 — End: 2016-04-06

## 2016-03-14 MED ORDER — PANTOPRAZOLE SODIUM 40 MG PO TBEC: 40.0000 mg | DELAYED_RELEASE_TABLET | Freq: Every day | ORAL | 5 refills | Status: DC

## 2016-03-14 NOTE — Assessment & Plan Note (Signed)
Deteriorated. Patient re-educated about  the importance of commitment to a  minimum of 150 minutes of exercise per week.  The importance of healthy food choices with portion control discussed. Encouraged to start a food diary, count calories and to consider  joining a support group. Sample diet sheets offered. Goals set by the patient for the next several months.   Weight /BMI 03/14/2016 01/31/2016 10/21/2015  WEIGHT 214 lb 210 lb 212 lb 8 oz  HEIGHT 5\' 0"  4' 11.75" 5\' 0"   BMI 41.79 kg/m2 41.34 kg/m2 41.5 kg/m2

## 2016-03-14 NOTE — Progress Notes (Signed)
Assessment:     Plan:

## 2016-03-14 NOTE — Patient Instructions (Addendum)
F/u in 6 weeks, call if you need me before  Increase in dose of maxzide to one and a half tablets daily  You are referred urgently to cardiology due to new heart failure symptoms  You are referred to dermatology at [facility] re rash which is worsenign  Blood work looks good  EKG today in office due to new exertional fatigue

## 2016-03-14 NOTE — Progress Notes (Signed)
   Sabrina [name]     MRN: [id number]      DOB: [birth year]   HPI Sabrina [name] is here for follow up and re-evaluation of chronic medical conditions, medication management and review of any available recent lab and radiology data.  Preventive health is updated, specifically  Cancer screening and Immunization.   4 week h/o increased shortness of breath and poor exercise tolerance with activity. C/o episodes of PND, needs to use 2 pillows, cannot lie flat and feels as though she  Full of fluid.    ROS Denies recent fever or chills. Denies sinus pressure, nasal congestion, ear pain or sore throat. Denies chest congestion, productive cough or wheezing.  Denies abdominal pain, nausea, vomiting,diarrhea or constipation.   Denies dysuria, frequency, hesitancy or incontinence. Denies joint pain, swelling and limitation in mobility. Denies headaches, seizures, numbness, or tingling. Denies depression,  Does have anxiety and  Insomnia.Worried about health x 4 weeks .   PE  BP (!) 148/88   Pulse (!) 103   Resp 16   Ht 5' (1.524 m)   Wt 214 lb (97.1 kg)   LMP 10/31/2002   SpO2 96%   BMI 41.79 kg/m   Patient alert and oriented and in no cardiopulmonary distress.  HEENT: No facial asymmetry, EOMI,   oropharynx pink and moist.  Neck supple no JVD, no mass.  Chest: Clear to auscultation bilaterally.No reproducible tenderness  CVS: S1, S2 no murmurs, no S3.Regular rate.  EKG : inverted T waves V1 through V6, sinus rhythm, no LVH ABD: Soft non tender.   Ext: one plus edema  MS: Adequate ROM spine, shoulders, hips and knees.  Skin: hyper pigmented skin lesions on both shins, no visible breakdown, hypopigmented macular lesions on chest and face and back  Psych: Good eye contact, normal affect. Memory intact not anxious or depressed appearing.  CNS: CN 2-12 intact, power,  normal throughout.no focal deficits noted.   Assessment & Plan Essential hypertension Uncontrolled with  fluid retention and symptoms of heart failure x 4 weeeks, resumed maxzide independently 2 weeks ago Increase dose to 1.5 tabs daily, and referral to cardiology re ehart failure symptoms which are new  Rash and nonspecific skin eruption Over 4 year h/o rash on extremities and trunk. Multiple evaluations, including [facility].no clear diagnosis, worse in past 2 months, hyperpigmentation of legs, , excessive spreading and itch of rash on trunk and face, needs help, opinion request at [facility]  CHF (congestive heart failure) ([employer]) 4 week h/o fluid retention, PND, exertional fatigue, refer card , EKG in office today shows new t wave inversion, pt reports one episode of near syncope approx 4 weeks ago while walking  Nausea without vomiting Chronic and ongoing , uses medication as needed  Morbid obesity with BMI of 45.0-49.9, adult ([place]) Deteriorated. Patient re-educated about  the importance of commitment to a  minimum of 150 minutes of exercise per week.  The importance of healthy food choices with portion control discussed. Encouraged to start a food diary, count calories and to consider  joining a support group. Sample diet sheets offered. Goals set by the patient for the next several months.   Weight /BMI 03/14/2016 01/31/2016 10/21/2015  WEIGHT 214 lb 210 lb 212 lb 8 oz  HEIGHT 5\' 0"  4' 11.75" 5\' 0"   BMI 41.79 kg/m2 41.34 kg/m2 41.5 kg/m2

## 2016-03-14 NOTE — Assessment & Plan Note (Addendum)
4 week h/o fluid retention, PND, exertional fatigue, refer card , EKG in office today shows new t wave inversion, pt reports one episode of near syncope approx 4 weeks ago while walking

## 2016-03-14 NOTE — Assessment & Plan Note (Addendum)
Uncontrolled with fluid retention and symptoms of heart failure x 4 weeeks, resumed maxzide independently 2 weeks ago Increase dose to 1.5 tabs daily, and referral to cardiology re ehart failure symptoms which are new

## 2016-03-14 NOTE — Assessment & Plan Note (Signed)
Chronic and ongoing , uses medication as needed

## 2016-03-14 NOTE — Assessment & Plan Note (Addendum)
Over 4 year h/o rash on extremities and trunk. Multiple evaluations, including [facility].no clear diagnosis, worse in past 2 months, hyperpigmentation of legs, , excessive spreading and itch of rash on trunk and face, needs help, opinion request at [facility]

## 2016-03-15 ENCOUNTER — Telehealth: Payer: Self-pay

## 2016-03-15 DIAGNOSIS — R609 Edema, unspecified: Secondary | ICD-10-CM

## 2016-03-15 DIAGNOSIS — R9431 Abnormal electrocardiogram [ECG] [EKG]: Secondary | ICD-10-CM

## 2016-03-15 NOTE — Telephone Encounter (Signed)
Patient to come in tomorrow for BNP and troponin. She is unable to schedule appointment for evaluation until 8/14.

## 2016-03-15 NOTE — Telephone Encounter (Signed)
-----   Message from Sueanne Margarita, MD sent at 03/15/2016  1:05 PM EDT ----- Regarding: RE: help pls Sabrina [name],  This patient has been having CP and CHF symptoms and now has an abnormal EKG.  She needs to be seen in our office this afternoon or tomorrow.  Please have her come in today for a BNp and Trop.  Florene Glen ----- Message ----- From: Fayrene Helper, MD Sent: 03/14/2016   5:09 PM To: Sueanne Margarita, MD, Fayrene Helper, MD Subject: help pls                                       Good  Pm!  I am asking for help with our mutual pt. I know that she had normal studies one year ago , however, today when I evaluated her I am concerned regarding symptoms of CHF, change in EKG, and episode reported of near syncope associated with chest pain approx 3 weeks ago.  Thanks

## 2016-03-16 ENCOUNTER — Other Ambulatory Visit: Payer: 59 | Admitting: *Deleted

## 2016-03-16 DIAGNOSIS — R9431 Abnormal electrocardiogram [ECG] [EKG]: Secondary | ICD-10-CM

## 2016-03-16 DIAGNOSIS — R609 Edema, unspecified: Secondary | ICD-10-CM

## 2016-03-16 LAB — TROPONIN I

## 2016-03-16 LAB — BRAIN NATRIURETIC PEPTIDE: BRAIN NATRIURETIC PEPTIDE: 409.5 pg/mL — AB (ref <100)

## 2016-03-19 ENCOUNTER — Telehealth: Payer: Self-pay | Admitting: Cardiology

## 2016-03-19 DIAGNOSIS — I5030 Unspecified diastolic (congestive) heart failure: Secondary | ICD-10-CM

## 2016-03-19 MED ORDER — FUROSEMIDE 40 MG PO TABS: 40.0000 mg | ORAL_TABLET | Freq: Every day | ORAL | 11 refills | Status: DC

## 2016-03-19 NOTE — Progress Notes (Signed)
Cardiology Office Note:    Date:  03/20/2016   ID:  [name], Sabrina [name] 16, 1962, MRN [id number]  PCP:  Sabrina Nakayama, MD  Cardiologist:  Dr. Fransico Him   Electrophysiologist:  n/a  Referring MD: Sabrina Helper, MD   Chief Complaint  Patient presents with  . Congestive Heart Failure   History of Present Illness:    Sabrina [name] is a 55 y.o. female with a hx of morbid obesity, HTN, HL, GERD, DM2. She was evaluated by Dr. Fransico Him in 6/16 for surgical clearance prior to gastric sleeve surgery.  Myoview in 6/16 was low risk.  Echo in 6/16 demonstrated normal LVEF.    She was seen by primary care last week with symptoms of CHF. She was also noted have an abnormal ECG. Dr. Radford [name] was contacted.  The patient was brought in for Troponin and BNP.  The Troponin was neg.  BNP was 409.5.  Maxzide was stopped and she was put on Lasix. Echo is pending. She is brought in for evaluation.    She notes worsening DOE since June 2017.  She notes dyspnea with minimal activity and assoc chest tightness. She has one episode of near syncope last month.  She denies frank syncope. She sleeps on an incline.  She denies PND.  She notes LE edema.  This is improved after resuming her diuretics.  She did have a 4 lbs weight gain.  She started Lasix yesterday and has noted some improvement since.     Prior CV studies that were reviewed today include:    Myoview 6/16 There is no resting or stress perfusion defect consistent with no prior infarct and no ischemia. EF 73 %  The patient was hypertensive prior and during the study.  Echo 6/16 EF 60-65%, no RWMA.  Carotid US 8/08 Estimated stenosis in the right and left internal carotid arteries is 0-50% and 0-50%.  Past Medical History:  Diagnosis Date  . Allergy   . Anemia   . Arthritis   . Asthma   . Chronic bronchitis ([place])   . Diabetes mellitus   . Endometriosis   . Fibroid uterus   . GERD (gastroesophageal reflux disease)    . History of Doppler ultrasound    a. Carotid US 8/08:Estimated stenosis in the right and left internal carotid arteries is 0-50% and 0-50%.  . History of echocardiogram    a. Echo 6/16: EF 60-65%, no RWMA.  . History of hiatal hernia   . History of nuclear stress test    a. Myoview 6/16: There is no resting or stress perfusion defect consistent with no prior infarct and no ischemia. EF 73 %  The patient was hypertensive prior and during the study.  Sabrina [name] Hx of iron deficiency anemia   . Hypersomnia with sleep apnea   . Hypertension   . 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 Menorrhagia   . Obesity   . Pneumonia   . Secondary hyperthyroidism   . Thyroid disease   . Tinnitus    left ear  . Vertigo   . Vitamin D deficiency     Past Surgical History:  Procedure Laterality Date  . ABDOMINAL HYSTERECTOMY  2004   supracervical hysterectomy  . BREAST SURGERY  2005   reduction  . BREATH TEK H PYLORI [name] 12/24/2014   Procedure: BREATH TEK H PYLORI;  Surgeon: Sabrina Pickerel, MD;  Location: [facility];  Service: General;  Laterality: [name];  . DILATION AND  CURETTAGE OF UTERUS     age 40  . ESOPHAGOGASTRODUODENOSCOPY (EGD) WITH PROPOFOL [name] 10/14/2015   Procedure: ESOPHAGOGASTRODUODENOSCOPY (EGD) WITH PROPOFOL;  Surgeon: Clarene Essex, MD;  Location: [facility];  Service: Endoscopy;  Laterality: [name];  . LAPAROSCOPIC GASTRIC SLEEVE RESECTION WITH HIATAL HERNIA REPAIR [name] 07/05/2015   Procedure: LAPAROSCOPIC GASTRIC SLEEVE RESECTION WITH HIATAL HERNIA REPAIR, upper endoscopy;  Surgeon: Sabrina Pickerel, MD;  Location: [facility];  Service: General;  Laterality: [name];    Current Medications: Current Meds  Medication Sig  . ergocalciferol (VITAMIN D2) 50000 units capsule Take 1 capsule (50,000 Units total) by mouth once a week. One capsule once weekly  . furosemide (LASIX) 40 MG tablet Take 1 tablet (40 mg total) by mouth daily.  . Multiple Vitamin (MULTIVITAMIN WITH MINERALS) TABS tablet Take 1 tablet by mouth  daily.  . ondansetron (ZOFRAN ODT) 4 MG disintegrating tablet Take 1 tablet (4 mg total) by mouth every 8 (eight) hours as needed for nausea or vomiting.  . pantoprazole (PROTONIX) 40 MG tablet Take 40 mg by mouth daily as needed (ACID REFLUX).  Sabrina [name] triamcinolone ointment (KENALOG) 0.1 % APPLY TO ITCHING SPOTS ON THE BODY DAILY TO TWICE DAILY AS NEEDED. NOT TO FACE.       Allergies:   Ace inhibitors; Codeine; Doxycycline; Peanut-containing drug products; Potassium iodide; Amlodipine; Latex; and Penicillins   Social History   Social History  . Marital status: Married    Spouse name: [name]  . Number of children: [name]  . Years of education: 81   Occupational History  . 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 Smoking status: Never Smoker  . Smokeless tobacco: Never Used  . Alcohol use No  . Drug use: No  . Sexual activity: Yes    Partners: Male    Birth control/ protection: Surgical     Comment: hysterectomy   Other Topics Concern  . None   Social History Narrative   Regular exercise-no   Caffeine Use-no           Family History:  The patient's family history includes Arthritis in her father, mother, and sister; Bleeding Disorder in her sister; Breast cancer in her sister; CAD in her mother; Cancer in her father; Colon cancer in her maternal grandfather; Depression in her father, mother, and sister; Diabetes in her father and mother; Hypertension in her father, mother, and sister; Stroke in her father and maternal aunt; Thyroid disease in her sister.   ROS:   Please see the history of present illness.    Review of Systems  Constitution: Positive for malaise/fatigue.  HENT: Positive for headaches.   Cardiovascular: Positive for dyspnea on exertion and leg swelling.  Respiratory: Positive for cough, shortness of breath and wheezing.   Skin: Positive for rash.  Musculoskeletal: Positive for joint swelling.   All other systems reviewed and  are negative.   EKGs/Labs/Other Test Reviewed:    EKG:  EKG is  ordered today.  The ekg ordered today demonstrates NSR, HR 68, inf Q waves, deep TWI 2, 3, aVF, V1-6 (Wellen's T waves), QTc 544 ms  Recent Labs: 08/22/2015: ALT 10 03/10/2016: BUN 19; Creat 1.10; Hemoglobin 12.1; Platelets 352; Potassium 3.6; Sodium 143 03/16/2016: Brain Natriuretic Peptide 409.5   Recent Lipid Panel    Component Value Date/Time   CHOL 111 (L) 03/10/2016 1038   TRIG 83 03/10/2016 1038   HDL 32 (L) 03/10/2016 1038   CHOLHDL 3.5 03/10/2016 1038  VLDL 17 03/10/2016 1038   LDLCALC 62 03/10/2016 1038    Physical Exam:    VS:  BP (!) 142/90   Pulse 70   Ht 5' (1.524 m)   Wt 203 lb (92.1 kg)   LMP 10/31/2002   SpO2 97%   BMI 39.65 kg/m     Wt Readings from Last 3 Encounters:  03/20/16 203 lb (92.1 kg)  03/14/16 214 lb (97.1 kg)  01/31/16 210 lb (95.3 kg)   Physical Exam  Constitutional: She is oriented to person, place, and time. She appears well-developed and well-nourished. She appears distressed.  HENT:  Head: Normocephalic and atraumatic.  Eyes: No scleral icterus.  Neck: Normal range of motion. No JVD present. Carotid bruit is not present.  Cardiovascular: Normal rate, regular rhythm, S1 normal and S2 normal.  Exam reveals no gallop and no friction rub.   No murmur heard. Pulmonary/Chest: Effort normal and breath sounds normal. She has no wheezes. She has no rhonchi. She has no rales.  Abdominal: Soft. There is no tenderness.  Musculoskeletal: She exhibits no edema.  Neurological: She is alert and oriented to person, place, and time.  Skin: Skin is warm and dry.  Psychiatric: She has a normal mood and affect.    ASSESSMENT:    1. Acute congestive heart failure, unspecified congestive heart failure type ([place])   2. Precordial pain   3. Essential hypertension   4. Prolonged Q-T interval on ECG    PLAN:    In order of problems listed above:  1. Acute CHF - Echo in 2016 with normal  LVEF.  She presents now with progressively worsening DOE and evidence of volume overload. She also has chest discomfort with exertion.  Her ECG is markedly abnormal with deep inf and anterolateral TWI.  I am concerned that she has 3 v CAD and reduced LVEF contributing to her symptoms.  I have recommended proceeding with cardiac catheterization to further evaluate her symptoms.  I d/w Dr. Fransico Him who agreed.  Risks and benefits of cardiac catheterization have been discussed with the patient.  These include bleeding, infection, kidney damage, stroke, heart attack, death.  The patient understands these risks and is willing to proceed.   -  Arrange R/L heart cath this week  -  Continue Lasix  -  Start ASA 81 mg QD, Metoprolol Tartrate 12.5 mg bid  -  NTG 0.4 mg prn  -  Labs today:  BMET, CBC, INR  -  Go to ED if symptoms worsen  -  Echo scheduled for tomorrow.  2. Chest pain  - She describes CCS Class 3 angina.  Her ECG is markedly abnormal.  Proceed with cardiac cath this week.  Start beta blocker, ASA as noted.  She will need Lipids checked at some point.   3. HTN - BP with borderline control.  Add beta-blocker as noted.    4. Prolonged QT - Avoid QT prolonging drugs.  BMET, Mg2+ today.   Medication Adjustments/Labs and Tests Ordered: Current medicines are reviewed at length with the patient today.  Concerns regarding medicines are outlined above.  Medication changes, Labs and Tests ordered today are outlined in the Patient Instructions noted below. Patient Instructions  Medication Instructions:  1. START ASPIRIN 81 MG DAILY 2. START METOPROLOL TARTRATE 25 MG TABLET WITH THE DIRECTIONS ON BOTTLE TO READ: TAKE 1/2 TABLET (12.5 MG ) TWICE DAILY 3. AN RX FOR NITROGLYCERIN HAS BEEN SENT IN AND YOU HAVE BEEN ADVISED AS TO  HOW AND WHEN TO USE NTG Labwork: TODAY BMET, CBC W/DIFF, PT/INR, MAGNESIUM LEVEL Testing/Procedures: Your physician has requested that you have a cardiac catheterization.  Cardiac catheterization is used to diagnose and/or treat various heart conditions. Doctors [name] recommend this procedure for a number of different reasons. The most common reason is to evaluate chest pain. Chest pain can be a symptom of coronary artery disease (CAD), and cardiac catheterization can show whether plaque is narrowing or blocking your heart's arteries. This procedure is also used to evaluate the valves, as well as measure the blood flow and oxygen levels in different parts of your heart. For further information please visit [email or web address]. Please follow instruction sheet, as given. Follow-Up: Richardson Dopp, [facility] ON 04/06/16 @ 11:15 POST CATH Any Other Special Instructions Will Be Listed Below (If Applicable). If you need a refill on your cardiac medications before your next appointment, please call your pharmacy.  Signed, Richardson Dopp, PA-C  03/20/2016 3:28 PM    [facility] [place], [place], [place]  [address] Phone: [phone]; Fax: [phone]

## 2016-03-19 NOTE — Telephone Encounter (Signed)
Sabrina [name] is returning a call back to [facility]

## 2016-03-19 NOTE — Telephone Encounter (Signed)
-----   Message from Sueanne Margarita, MD sent at 03/16/2016  3:32 PM EDT ----- BNp is elevated c/w diastolic CHF.  Please stop maxide and start lasix 40mg  daily.  She needs a 2D echo to evaluate LVF.  This patient needs to be worked in with an extender on Monday (need to move up 8/14 appt to Monday 8/7). If her SOB worsens of she has recurrent CP over the weekend she needs to go to the ER.

## 2016-03-19 NOTE — Telephone Encounter (Signed)
Informed patient of results and verbal understanding expressed.  Instructed patient to STOP MAXIDE and START LASIX 40 mg daily. ECHO ordered for scheduling. Scheduled patient to see Richardson Dopp for evaluation tomorrow, 8/8. Patient agrees with treatment plan.

## 2016-03-19 NOTE — Telephone Encounter (Signed)
Left message to call back  

## 2016-03-20 ENCOUNTER — Encounter: Payer: Self-pay | Admitting: Physician Assistant

## 2016-03-20 ENCOUNTER — Encounter: Payer: Self-pay | Admitting: *Deleted

## 2016-03-20 ENCOUNTER — Ambulatory Visit (INDEPENDENT_AMBULATORY_CARE_PROVIDER_SITE_OTHER): Payer: 59 | Admitting: Physician Assistant

## 2016-03-20 VITALS — BP 142/90 | HR 70 | Ht 60.0 in | Wt 203.0 lb

## 2016-03-20 DIAGNOSIS — I4581 Long QT syndrome: Secondary | ICD-10-CM | POA: Diagnosis not present

## 2016-03-20 DIAGNOSIS — R072 Precordial pain: Secondary | ICD-10-CM

## 2016-03-20 DIAGNOSIS — I10 Essential (primary) hypertension: Secondary | ICD-10-CM | POA: Diagnosis not present

## 2016-03-20 DIAGNOSIS — R9431 Abnormal electrocardiogram [ECG] [EKG]: Secondary | ICD-10-CM

## 2016-03-20 DIAGNOSIS — I509 Heart failure, unspecified: Secondary | ICD-10-CM

## 2016-03-20 MED ORDER — NITROGLYCERIN 0.4 MG SL SUBL: 0.4000 mg | SUBLINGUAL_TABLET | SUBLINGUAL | 3 refills | Status: DC | PRN

## 2016-03-20 MED ORDER — ASPIRIN EC 81 MG PO TBEC: 81.0000 mg | DELAYED_RELEASE_TABLET | Freq: Every day | ORAL | Status: DC

## 2016-03-20 MED ORDER — METOPROLOL TARTRATE 25 MG PO TABS: 12.5000 mg | ORAL_TABLET | Freq: Two times a day (BID) | ORAL | 3 refills | Status: DC

## 2016-03-20 NOTE — Patient Instructions (Addendum)
Medication Instructions:  1. START ASPIRIN 81 MG DAILY 2. START METOPROLOL TARTRATE 25 MG TABLET WITH THE DIRECTIONS ON BOTTLE TO READ: TAKE 1/2 TABLET (12.5 MG ) TWICE DAILY 3. AN RX FOR NITROGLYCERIN HAS BEEN SENT IN AND YOU HAVE BEEN ADVISED AS TO HOW AND WHEN TO USE NTG Labwork: TODAY BMET, CBC W/DIFF, PT/INR, MAGNESIUM LEVEL Testing/Procedures: Your physician has requested that you have a cardiac catheterization. Cardiac catheterization is used to diagnose and/or treat various heart conditions. Doctors may recommend this procedure for a number of different reasons. The most common reason is to evaluate chest pain. Chest pain can be a symptom of coronary artery disease (CAD), and cardiac catheterization can show whether plaque is narrowing or blocking your heart's arteries. This procedure is also used to evaluate the valves, as well as measure the blood flow and oxygen levels in different parts of your heart. For further information please visit HugeFiesta.[email or web address]. Please follow instruction sheet, as given. Follow-Up: Richardson Dopp, [facility] ON 04/06/16 @ 11:15 POST CATH Any Other Special Instructions Will Be Listed Below (If Applicable). If you need a refill on your cardiac medications before your next appointment, please call your pharmacy.

## 2016-03-21 ENCOUNTER — Other Ambulatory Visit (HOSPITAL_COMMUNITY): Payer: 59

## 2016-03-21 DIAGNOSIS — R0989 Other specified symptoms and signs involving the circulatory and respiratory systems: Secondary | ICD-10-CM

## 2016-03-21 LAB — CBC WITH DIFFERENTIAL/PLATELET
BASOS ABS: 0 {cells}/uL (ref 0–200)
Basophils Relative: 0 %
EOS ABS: 492 {cells}/uL (ref 15–500)
Eosinophils Relative: 4 %
HCT: 41.5 % (ref 35.0–45.0)
Hemoglobin: 13.6 g/dL (ref 11.7–15.5)
LYMPHS PCT: 27 %
Lymphs Abs: 3321 cells/uL (ref 850–3900)
MCH: 26.8 pg — AB (ref 27.0–33.0)
MCHC: 32.8 g/dL (ref 32.0–36.0)
MCV: 81.7 fL (ref 80.0–100.0)
MONOS PCT: 6 %
MPV: 10.8 fL (ref 7.5–12.5)
Monocytes Absolute: 738 cells/uL (ref 200–950)
NEUTROS PCT: 63 %
Neutro Abs: 7749 cells/uL (ref 1500–7800)
PLATELETS: 344 10*3/uL (ref 140–400)
RBC: 5.08 MIL/uL (ref 3.80–5.10)
RDW: 15.7 % — AB (ref 11.0–15.0)
WBC: 12.3 10*3/uL — ABNORMAL HIGH (ref 3.8–10.8)

## 2016-03-21 LAB — BASIC METABOLIC PANEL
BUN: 23 mg/dL (ref 7–25)
CHLORIDE: 95 mmol/L — AB (ref 98–110)
CO2: 30 mmol/L (ref 20–31)
CREATININE: 1.22 mg/dL — AB (ref 0.50–1.05)
Calcium: 7.6 mg/dL — ABNORMAL LOW (ref 8.6–10.4)
Glucose, Bld: 77 mg/dL (ref 65–99)
Potassium: 3.2 mmol/L — ABNORMAL LOW (ref 3.5–5.3)
Sodium: 143 mmol/L (ref 135–146)

## 2016-03-21 LAB — MAGNESIUM: Magnesium: 1.8 mg/dL (ref 1.5–2.5)

## 2016-03-21 LAB — PROTIME-INR
INR: 1.1
Prothrombin Time: 11.7 s — ABNORMAL HIGH (ref 9.0–11.5)

## 2016-03-22 ENCOUNTER — Other Ambulatory Visit (HOSPITAL_COMMUNITY): Payer: 59

## 2016-03-22 ENCOUNTER — Encounter: Payer: Self-pay | Admitting: Physician Assistant

## 2016-03-22 ENCOUNTER — Ambulatory Visit (HOSPITAL_COMMUNITY)
Admission: RE | Admit: 2016-03-22 | Discharge: 2016-03-22 | Disposition: A | Discharge status: Home / Self Care | Payer: 59 | Source: Ambulatory Visit | Attending: Cardiovascular Disease | Admitting: Cardiovascular Disease

## 2016-03-22 ENCOUNTER — Encounter (HOSPITAL_COMMUNITY)
Admission: RE | Disposition: A | Discharge status: Home / Self Care | Payer: Self-pay | Source: Ambulatory Visit | Attending: Cardiovascular Disease

## 2016-03-22 ENCOUNTER — Ambulatory Visit (HOSPITAL_BASED_OUTPATIENT_CLINIC_OR_DEPARTMENT_OTHER): Payer: 59

## 2016-03-22 DIAGNOSIS — Z883 Allergy status to other anti-infective agents status: Secondary | ICD-10-CM | POA: Diagnosis not present

## 2016-03-22 DIAGNOSIS — M1990 Unspecified osteoarthritis, unspecified site: Secondary | ICD-10-CM | POA: Diagnosis not present

## 2016-03-22 DIAGNOSIS — M329 Systemic lupus erythematosus, unspecified: Secondary | ICD-10-CM | POA: Insufficient documentation

## 2016-03-22 DIAGNOSIS — J45909 Unspecified asthma, uncomplicated: Secondary | ICD-10-CM | POA: Diagnosis not present

## 2016-03-22 DIAGNOSIS — G4710 Hypersomnia, unspecified: Secondary | ICD-10-CM | POA: Insufficient documentation

## 2016-03-22 DIAGNOSIS — I110 Hypertensive heart disease with heart failure: Secondary | ICD-10-CM | POA: Diagnosis not present

## 2016-03-22 DIAGNOSIS — E559 Vitamin D deficiency, unspecified: Secondary | ICD-10-CM | POA: Insufficient documentation

## 2016-03-22 DIAGNOSIS — Z880 Allergy status to penicillin: Secondary | ICD-10-CM | POA: Insufficient documentation

## 2016-03-22 DIAGNOSIS — E0580 Other thyrotoxicosis without thyrotoxic crisis or storm: Secondary | ICD-10-CM | POA: Diagnosis not present

## 2016-03-22 DIAGNOSIS — Z9884 Bariatric surgery status: Secondary | ICD-10-CM | POA: Diagnosis not present

## 2016-03-22 DIAGNOSIS — R9431 Abnormal electrocardiogram [ECG] [EKG]: Secondary | ICD-10-CM | POA: Insufficient documentation

## 2016-03-22 DIAGNOSIS — Z803 Family history of malignant neoplasm of breast: Secondary | ICD-10-CM | POA: Insufficient documentation

## 2016-03-22 DIAGNOSIS — Z91040 Latex allergy status: Secondary | ICD-10-CM | POA: Diagnosis not present

## 2016-03-22 DIAGNOSIS — I509 Heart failure, unspecified: Secondary | ICD-10-CM | POA: Diagnosis not present

## 2016-03-22 DIAGNOSIS — I272 Other secondary pulmonary hypertension: Secondary | ICD-10-CM | POA: Insufficient documentation

## 2016-03-22 DIAGNOSIS — R072 Precordial pain: Secondary | ICD-10-CM

## 2016-03-22 DIAGNOSIS — Z6839 Body mass index (BMI) 39.0-39.9, adult: Secondary | ICD-10-CM | POA: Diagnosis not present

## 2016-03-22 DIAGNOSIS — K219 Gastro-esophageal reflux disease without esophagitis: Secondary | ICD-10-CM | POA: Insufficient documentation

## 2016-03-22 DIAGNOSIS — G4730 Sleep apnea, unspecified: Secondary | ICD-10-CM | POA: Diagnosis not present

## 2016-03-22 DIAGNOSIS — Z833 Family history of diabetes mellitus: Secondary | ICD-10-CM | POA: Insufficient documentation

## 2016-03-22 DIAGNOSIS — Z8249 Family history of ischemic heart disease and other diseases of the circulatory system: Secondary | ICD-10-CM | POA: Insufficient documentation

## 2016-03-22 DIAGNOSIS — Z862 Personal history of diseases of the blood and blood-forming organs and certain disorders involving the immune mechanism: Secondary | ICD-10-CM | POA: Diagnosis not present

## 2016-03-22 DIAGNOSIS — Z823 Family history of stroke: Secondary | ICD-10-CM | POA: Insufficient documentation

## 2016-03-22 DIAGNOSIS — J42 Unspecified chronic bronchitis: Secondary | ICD-10-CM | POA: Diagnosis not present

## 2016-03-22 DIAGNOSIS — E785 Hyperlipidemia, unspecified: Secondary | ICD-10-CM | POA: Diagnosis not present

## 2016-03-22 DIAGNOSIS — Z800 Family history of malignant neoplasm of digestive organs: Secondary | ICD-10-CM | POA: Insufficient documentation

## 2016-03-22 DIAGNOSIS — R42 Dizziness and giddiness: Secondary | ICD-10-CM | POA: Insufficient documentation

## 2016-03-22 DIAGNOSIS — H9312 Tinnitus, left ear: Secondary | ICD-10-CM | POA: Diagnosis not present

## 2016-03-22 DIAGNOSIS — E119 Type 2 diabetes mellitus without complications: Secondary | ICD-10-CM | POA: Diagnosis not present

## 2016-03-22 DIAGNOSIS — R079 Chest pain, unspecified: Secondary | ICD-10-CM | POA: Diagnosis present

## 2016-03-22 HISTORY — PX: CARDIAC CATHETERIZATION: SHX172

## 2016-03-22 LAB — POCT I-STAT 3, ART BLOOD GAS (G3+)
Acid-Base Excess: 5 mmol/L — ABNORMAL HIGH (ref 0.0–2.0)
Bicarbonate: 31.2 mEq/L — ABNORMAL HIGH (ref 20.0–24.0)
O2 SAT: 94 %
PCO2 ART: 50.8 mmHg — AB (ref 35.0–45.0)
PO2 ART: 71 mmHg — AB (ref 80.0–100.0)
TCO2: 33 mmol/L (ref 0–100)
pH, Arterial: 7.397 (ref 7.350–7.450)

## 2016-03-22 LAB — POCT I-STAT 3, VENOUS BLOOD GAS (G3P V)
Acid-Base Excess: 6 mmol/L — ABNORMAL HIGH (ref 0.0–2.0)
BICARBONATE: 32.6 meq/L — AB (ref 20.0–24.0)
O2 Saturation: 60 %
PO2 VEN: 32 mmHg (ref 31.0–45.0)
TCO2: 34 mmol/L (ref 0–100)
pCO2, Ven: 53.1 mmHg — ABNORMAL HIGH (ref 45.0–50.0)
pH, Ven: 7.395 — ABNORMAL HIGH (ref 7.250–7.300)

## 2016-03-22 LAB — GLUCOSE, CAPILLARY
GLUCOSE-CAPILLARY: 90 mg/dL (ref 65–99)
Glucose-Capillary: 79 mg/dL (ref 65–99)

## 2016-03-22 LAB — ECHOCARDIOGRAM COMPLETE
HEIGHTINCHES: 60 in
WEIGHTICAEL: 3248 [oz_av]

## 2016-03-22 LAB — POTASSIUM: POTASSIUM: 3 mmol/L — AB (ref 3.5–5.1)

## 2016-03-22 SURGERY — RIGHT/LEFT HEART CATH AND CORONARY ANGIOGRAPHY

## 2016-03-22 MED ORDER — IOPAMIDOL (ISOVUE-370) INJECTION 76%
INTRAVENOUS | Status: DC | PRN
  Administered 2016-03-22: 60 mL via INTRA_ARTERIAL

## 2016-03-22 MED ORDER — FENTANYL CITRATE (PF) 100 MCG/2ML IJ SOLN
INTRAMUSCULAR | Status: DC | PRN
  Administered 2016-03-22: 50 ug via INTRAVENOUS
  Administered 2016-03-22: 25 ug via INTRAVENOUS

## 2016-03-22 MED ORDER — ASPIRIN 81 MG PO CHEW: 81.0000 mg | CHEWABLE_TABLET | ORAL | Status: DC

## 2016-03-22 MED ORDER — ONDANSETRON HCL 4 MG/2ML IJ SOLN: 4.0000 mg | Freq: Four times a day (QID) | INTRAMUSCULAR | Status: DC | PRN

## 2016-03-22 MED ORDER — FENTANYL CITRATE (PF) 100 MCG/2ML IJ SOLN
INTRAMUSCULAR | Status: AC
  Filled 2016-03-22: qty 2

## 2016-03-22 MED ORDER — LIDOCAINE HCL (PF) 1 % IJ SOLN
INTRAMUSCULAR | Status: AC
  Filled 2016-03-22: qty 30

## 2016-03-22 MED ORDER — VERAPAMIL HCL 2.5 MG/ML IV SOLN
INTRAVENOUS | Status: AC
  Filled 2016-03-22: qty 2

## 2016-03-22 MED ORDER — IOPAMIDOL (ISOVUE-370) INJECTION 76%
INTRAVENOUS | Status: AC
  Filled 2016-03-22: qty 100

## 2016-03-22 MED ORDER — POTASSIUM CHLORIDE CRYS ER 20 MEQ PO TBCR
40.0000 meq | EXTENDED_RELEASE_TABLET | Freq: Once | ORAL | Status: AC
  Administered 2016-03-22: 40 meq via ORAL

## 2016-03-22 MED ORDER — LIDOCAINE HCL (PF) 1 % IJ SOLN
INTRAMUSCULAR | Status: DC | PRN
  Administered 2016-03-22: 2 mL via SUBCUTANEOUS
  Administered 2016-03-22: 25 mL via SUBCUTANEOUS

## 2016-03-22 MED ORDER — PERFLUTREN LIPID MICROSPHERE
INTRAVENOUS | Status: AC
Start: 2016-03-22 — End: 2016-03-22
  Filled 2016-03-22: qty 10

## 2016-03-22 MED ORDER — POTASSIUM CHLORIDE CRYS ER 20 MEQ PO TBCR
EXTENDED_RELEASE_TABLET | ORAL | Status: AC
  Administered 2016-03-22: 40 meq via ORAL
  Filled 2016-03-22: qty 2

## 2016-03-22 MED ORDER — SODIUM CHLORIDE 0.9% FLUSH: 3.0000 mL | Freq: Two times a day (BID) | INTRAVENOUS | Status: DC

## 2016-03-22 MED ORDER — DIAZEPAM 5 MG PO TABS: 5.0000 mg | ORAL_TABLET | Freq: Four times a day (QID) | ORAL | Status: DC | PRN

## 2016-03-22 MED ORDER — SODIUM CHLORIDE 0.9% FLUSH: 3.0000 mL | INTRAVENOUS | Status: DC | PRN

## 2016-03-22 MED ORDER — PERFLUTREN LIPID MICROSPHERE
1.0000 mL | INTRAVENOUS | Status: DC | PRN
  Administered 2016-03-22: 2 mL via INTRAVENOUS

## 2016-03-22 MED ORDER — HEPARIN (PORCINE) IN NACL 2-0.9 UNIT/ML-% IJ SOLN
INTRAMUSCULAR | Status: DC | PRN
Start: 2016-03-22 — End: 2016-03-22
  Administered 2016-03-22: 1000 mL

## 2016-03-22 MED ORDER — POTASSIUM CHLORIDE CRYS ER 20 MEQ PO TBCR
EXTENDED_RELEASE_TABLET | ORAL | Status: AC
  Filled 2016-03-22: qty 2

## 2016-03-22 MED ORDER — SODIUM CHLORIDE 0.9 % IV SOLN: 250.0000 mL | INTRAVENOUS | Status: DC | PRN

## 2016-03-22 MED ORDER — MIDAZOLAM HCL 2 MG/2ML IJ SOLN
INTRAMUSCULAR | Status: DC | PRN
Start: 2016-03-22 — End: 2016-03-22
  Administered 2016-03-22: 1 mg via INTRAVENOUS
  Administered 2016-03-22: 2 mg via INTRAVENOUS

## 2016-03-22 MED ORDER — ACETAMINOPHEN 325 MG PO TABS: 650.0000 mg | ORAL_TABLET | ORAL | Status: DC | PRN

## 2016-03-22 MED ORDER — SODIUM CHLORIDE 0.9 % IV SOLN: INTRAVENOUS | Status: DC

## 2016-03-22 MED ORDER — SODIUM CHLORIDE 0.9 % IV SOLN
INTRAVENOUS | Status: DC
  Administered 2016-03-22: 06:00:00 via INTRAVENOUS

## 2016-03-22 MED ORDER — MIDAZOLAM HCL 2 MG/2ML IJ SOLN
INTRAMUSCULAR | Status: AC
  Filled 2016-03-22: qty 2

## 2016-03-22 MED ORDER — NITROGLYCERIN 1 MG/10 ML FOR IR/CATH LAB
INTRA_ARTERIAL | Status: AC
  Filled 2016-03-22: qty 10

## 2016-03-22 MED ORDER — HEPARIN (PORCINE) IN NACL 2-0.9 UNIT/ML-% IJ SOLN
INTRAMUSCULAR | Status: AC
  Filled 2016-03-22: qty 1000

## 2016-03-22 SURGICAL SUPPLY — 16 items
CATH INFINITI 5FR MULTPACK ANG (CATHETERS) ×1 IMPLANT
CATH SWAN GANZ 7F STRAIGHT (CATHETERS) ×1 IMPLANT
GLIDESHEATH SLEND SS 6F .021 (SHEATH) ×1 IMPLANT
KIT HEART LEFT (KITS) ×2 IMPLANT
PACK CARDIAC CATHETERIZATION (CUSTOM PROCEDURE TRAY) ×2 IMPLANT
SHEATH FAST CATH BRACH 5F 5CM (SHEATH) ×1 IMPLANT
SHEATH PINNACLE 5F 10CM (SHEATH) ×1 IMPLANT
SHEATH PINNACLE 6F 10CM (SHEATH) IMPLANT
SHEATH PINNACLE 7F 10CM (SHEATH) ×1 IMPLANT
SYR MEDRAD MARK V 150ML (SYRINGE) ×2 IMPLANT
TRANSDUCER W/STOPCOCK (MISCELLANEOUS) ×4 IMPLANT
TUBING ART PRESS 72  MALE/FEM (TUBING) ×1
TUBING ART PRESS 72 MALE/FEM (TUBING) IMPLANT
TUBING CIL FLEX 10 FLL-RA (TUBING) ×2 IMPLANT
WIRE EMERALD 3MM-J .035X150CM (WIRE) ×1 IMPLANT
WIRE SAFE-T 1.5MM-J .035X260CM (WIRE) ×1 IMPLANT

## 2016-03-22 NOTE — Discharge Instructions (Signed)
Angiogram, Care After °Refer to this sheet in the next few weeks. These instructions provide you with information about caring for yourself after your procedure. Your health care provider may also give you more specific instructions. Your treatment has been planned according to current medical practices, but problems sometimes occur. Call your health care provider if you have any problems or questions after your procedure. °WHAT TO EXPECT AFTER THE PROCEDURE °After your procedure, it is typical to have the following: °· Bruising at the catheter insertion site that usually fades within 1-2 weeks. °· Blood collecting in the tissue (hematoma) that may be painful to the touch. It should usually decrease in size and tenderness within 1-2 weeks. °HOME CARE INSTRUCTIONS °· Take medicines only as directed by your health care provider. °· You may shower 24-48 hours after the procedure or as directed by your health care provider. Remove the bandage (dressing) and gently wash the site with plain soap and water. Pat the area dry with a clean towel. Do not rub the site, because this may cause bleeding. °· Do not take baths, swim, or use a hot tub until your health care provider approves. °· Check your insertion site every day for redness, swelling, or drainage. °· Do not apply powder or lotion to the site. °· Do not lift over 10 lb (4.5 kg) for 5 days after your procedure or as directed by your health care provider. °· Ask your health care provider when it is okay to: °¨ Return to work or school. °¨ Resume usual physical activities or sports. °¨ Resume sexual activity. °· Do not drive home if you are discharged the same day as the procedure. Have someone else drive you. °· You may drive 24 hours after the procedure unless otherwise instructed by your health care provider. °· Do not operate machinery or power tools for 24 hours after the procedure or as directed by your health care provider. °· If your procedure was done as an  outpatient procedure, which means that you went home the same day as your procedure, a responsible adult should be with you for the first 24 hours after you arrive home. °· Keep all follow-up visits as directed by your health care provider. This is important. °SEEK MEDICAL CARE IF: °· You have a fever. °· You have chills. °· You have increased bleeding from the catheter insertion site. Hold pressure on the site. °SEEK IMMEDIATE MEDICAL CARE IF: °· You have unusual pain at the catheter insertion site. °· You have redness, warmth, or swelling at the catheter insertion site. °· You have drainage (other than a small amount of blood on the dressing) from the catheter insertion site. °· The catheter insertion site is bleeding, and the bleeding does not stop after 30 minutes of holding steady pressure on the site. °· The area near or just beyond the catheter insertion site becomes pale, cool, tingly, or numb. °  °This information is not intended to replace advice given to you by your health care provider. Make sure you discuss any questions you have with your health care provider. °  °Document Released: 02/15/2005 Document Revised: 08/20/2014 Document Reviewed: 12/31/2012 °Elsevier Interactive Patient Education ©2016 [employer] ° °

## 2016-03-22 NOTE — Progress Notes (Signed)
  Echocardiogram 2D Echocardiogram with Definity has been performed.  Tresa Res 03/22/2016, 3:44 PM

## 2016-03-22 NOTE — H&P (View-Only) (Signed)
Cardiology Office Note:    Date:  03/20/2016   ID:  Sabrina, [name] Dec 27, 1960, MRN [id number]  PCP:  Sabrina Nakayama, MD  Cardiologist:  Dr. Fransico [name]   Electrophysiologist:  n/a  Referring MD: Sabrina Helper, MD   Chief Complaint  Patient presents with  . Congestive Heart Failure   History of Present Illness:    Sabrina [name] is a 55 y.o. female with a hx of morbid obesity, HTN, HL, GERD, DM2. She was evaluated by Dr. Fransico [name] in 6/16 for surgical clearance prior to gastric sleeve surgery.  Myoview in 6/16 was low risk.  Echo in 6/16 demonstrated normal LVEF.    She was seen by primary care last week with symptoms of CHF. She was also noted have an abnormal ECG. Dr. Radford [name] was contacted.  The patient was brought in for Troponin and BNP.  The Troponin was neg.  BNP was 409.5.  Maxzide was stopped and she was put on Lasix. Echo is pending. She is brought in for evaluation.    She notes worsening DOE since June 2017.  She notes dyspnea with minimal activity and assoc chest tightness. She has one episode of near syncope last month.  She denies frank syncope. She sleeps on an incline.  She denies PND.  She notes LE edema.  This is improved after resuming her diuretics.  She did have a 4 lbs weight gain.  She started Lasix yesterday and has noted some improvement since.     Prior CV studies that were reviewed today include:    Myoview 6/16 There is no resting or stress perfusion defect consistent with no prior infarct and no ischemia. EF 73 %  The patient was hypertensive prior and during the study.  Echo 6/16 EF 60-65%, no RWMA.  Carotid US 8/08 Estimated stenosis in the right and left internal carotid arteries is 0-50% and 0-50%.  Past Medical History:  Diagnosis Date  . Allergy   . Anemia   . Arthritis   . Asthma   . Chronic bronchitis ([place])   . Diabetes mellitus   . Endometriosis   . Fibroid uterus   . GERD (gastroesophageal reflux disease)    . History of Doppler ultrasound    a. Carotid US 8/08:Estimated stenosis in the right and left internal carotid arteries is 0-50% and 0-50%.  . History of echocardiogram    a. Echo 6/16: EF 60-65%, no RWMA.  . History of hiatal hernia   . History of nuclear stress test    a. Myoview 6/16: There is no resting or stress perfusion defect consistent with no prior infarct and no ischemia. EF 73 %  The patient was hypertensive prior and during the study.  [name] Hx of iron deficiency anemia   . Hypersomnia with sleep apnea   . Hypertension   . 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 Menorrhagia   . Obesity   . Pneumonia   . Secondary hyperthyroidism   . Thyroid disease   . Tinnitus    left ear  . Vertigo   . Vitamin D deficiency     Past Surgical History:  Procedure Laterality Date  . ABDOMINAL HYSTERECTOMY  2004   supracervical hysterectomy  . BREAST SURGERY  2005   reduction  . BREATH TEK H PYLORI [name] 12/24/2014   Procedure: BREATH TEK H PYLORI;  Surgeon: Sabrina Pickerel, MD;  Location: [facility];  Service: General;  Laterality: [name];  . DILATION AND  CURETTAGE OF UTERUS     age 23  . ESOPHAGOGASTRODUODENOSCOPY (EGD) WITH PROPOFOL [name] 10/14/2015   Procedure: ESOPHAGOGASTRODUODENOSCOPY (EGD) WITH PROPOFOL;  Surgeon: Clarene Essex, MD;  Location: [facility];  Service: Endoscopy;  Laterality: [name];  . LAPAROSCOPIC GASTRIC SLEEVE RESECTION WITH HIATAL HERNIA REPAIR [name] 07/05/2015   Procedure: LAPAROSCOPIC GASTRIC SLEEVE RESECTION WITH HIATAL HERNIA REPAIR, upper endoscopy;  Surgeon: Sabrina Pickerel, MD;  Location: [facility];  Service: General;  Laterality: [name];    Current Medications: Current Meds  Medication Sig  . ergocalciferol (VITAMIN D2) 50000 units capsule Take 1 capsule (50,000 Units total) by mouth once a week. One capsule once weekly  . furosemide (LASIX) 40 MG tablet Take 1 tablet (40 mg total) by mouth daily.  . Multiple Vitamin (MULTIVITAMIN WITH MINERALS) TABS tablet Take 1 tablet by mouth  daily.  . ondansetron (ZOFRAN ODT) 4 MG disintegrating tablet Take 1 tablet (4 mg total) by mouth every 8 (eight) hours as needed for nausea or vomiting.  . pantoprazole (PROTONIX) 40 MG tablet Take 40 mg by mouth daily as needed (ACID REFLUX).  [name] triamcinolone ointment (KENALOG) 0.1 % APPLY TO ITCHING SPOTS ON THE BODY DAILY TO TWICE DAILY AS NEEDED. NOT TO FACE.       Allergies:   Ace inhibitors; Codeine; Doxycycline; Peanut-containing drug products; Potassium iodide; Amlodipine; Latex; and Penicillins   Social History   Social History  . Marital status: Married    Spouse name: [name]  . Number of children: [name]  . Years of education: 58   Occupational History  . 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 Smoking status: Never Smoker  . Smokeless tobacco: Never Used  . Alcohol use No  . Drug use: No  . Sexual activity: Yes    Partners: Male    Birth control/ protection: Surgical     Comment: hysterectomy   Other Topics Concern  . None   Social History Narrative   Regular exercise-no   Caffeine Use-no           Family History:  The patient's family history includes Arthritis in her father, mother, and sister; Bleeding Disorder in her sister; Breast cancer in her sister; CAD in her mother; Cancer in her father; Colon cancer in her maternal grandfather; Depression in her father, mother, and sister; Diabetes in her father and mother; Hypertension in her father, mother, and sister; Stroke in her father and maternal aunt; Thyroid disease in her sister.   ROS:   Please see the history of present illness.    Review of Systems  Constitution: Positive for malaise/fatigue.  HENT: Positive for headaches.   Cardiovascular: Positive for dyspnea on exertion and leg swelling.  Respiratory: Positive for cough, shortness of breath and wheezing.   Skin: Positive for rash.  Musculoskeletal: Positive for joint swelling.   All other systems reviewed and  are negative.   EKGs/Labs/Other Test Reviewed:    EKG:  EKG is  ordered today.  The ekg ordered today demonstrates NSR, HR 68, inf Q waves, deep TWI 2, 3, aVF, V1-6 (Wellen's T waves), QTc 544 ms  Recent Labs: 08/22/2015: ALT 10 03/10/2016: BUN 19; Creat 1.10; Hemoglobin 12.1; Platelets 352; Potassium 3.6; Sodium 143 03/16/2016: Brain Natriuretic Peptide 409.5   Recent Lipid Panel    Component Value Date/Time   CHOL 111 (L) 03/10/2016 1038   TRIG 83 03/10/2016 1038   HDL 32 (L) 03/10/2016 1038   CHOLHDL 3.5 03/10/2016 1038  VLDL 17 03/10/2016 1038   LDLCALC 62 03/10/2016 1038    Physical Exam:    VS:  BP (!) 142/90   Pulse 70   Ht 5' (1.524 m)   Wt 203 lb (92.1 kg)   LMP 10/31/2002   SpO2 97%   BMI 39.65 kg/m     Wt Readings from Last 3 Encounters:  03/20/16 203 lb (92.1 kg)  03/14/16 214 lb (97.1 kg)  01/31/16 210 lb (95.3 kg)   Physical Exam  Constitutional: She is oriented to person, place, and time. She appears well-developed and well-nourished. She appears distressed.  HENT:  Head: Normocephalic and atraumatic.  Eyes: No scleral icterus.  Neck: Normal range of motion. No JVD present. Carotid bruit is not present.  Cardiovascular: Normal rate, regular rhythm, S1 normal and S2 normal.  Exam reveals no gallop and no friction rub.   No murmur heard. Pulmonary/Chest: Effort normal and breath sounds normal. She has no wheezes. She has no rhonchi. She has no rales.  Abdominal: Soft. There is no tenderness.  Musculoskeletal: She exhibits no edema.  Neurological: She is alert and oriented to person, place, and time.  Skin: Skin is warm and dry.  Psychiatric: She has a normal mood and affect.    ASSESSMENT:    1. Acute congestive heart failure, unspecified congestive heart failure type ([place])   2. Precordial pain   3. Essential hypertension   4. Prolonged Q-T interval on ECG    PLAN:    In order of problems listed above:  1. Acute CHF - Echo in 2016 with normal  LVEF.  She presents now with progressively worsening DOE and evidence of volume overload. She also has chest discomfort with exertion.  Her ECG is markedly abnormal with deep inf and anterolateral TWI.  I am concerned that she has 3 v CAD and reduced LVEF contributing to her symptoms.  I have recommended proceeding with cardiac catheterization to further evaluate her symptoms.  I d/w Dr. Fransico [name] who agreed.  Risks and benefits of cardiac catheterization have been discussed with the patient.  These include bleeding, infection, kidney damage, stroke, heart attack, death.  The patient understands these risks and is willing to proceed.   -  Arrange R/L heart cath this week  -  Continue Lasix  -  Start ASA 81 mg QD, Metoprolol Tartrate 12.5 mg bid  -  NTG 0.4 mg prn  -  Labs today:  BMET, CBC, INR  -  Go to ED if symptoms worsen  -  Echo scheduled for tomorrow.  2. Chest pain  - She describes CCS Class 3 angina.  Her ECG is markedly abnormal.  Proceed with cardiac cath this week.  Start beta blocker, ASA as noted.  She will need Lipids checked at some point.   3. HTN - BP with borderline control.  Add beta-blocker as noted.    4. Prolonged QT - Avoid QT prolonging drugs.  BMET, Mg2+ today.   Medication Adjustments/Labs and Tests Ordered: Current medicines are reviewed at length with the patient today.  Concerns regarding medicines are outlined above.  Medication changes, Labs and Tests ordered today are outlined in the Patient Instructions noted below. Patient Instructions  Medication Instructions:  1. START ASPIRIN 81 MG DAILY 2. START METOPROLOL TARTRATE 25 MG TABLET WITH THE DIRECTIONS ON BOTTLE TO READ: TAKE 1/2 TABLET (12.5 MG ) TWICE DAILY 3. AN RX FOR NITROGLYCERIN HAS BEEN SENT IN AND YOU HAVE BEEN ADVISED AS TO  HOW AND WHEN TO USE NTG Labwork: TODAY BMET, CBC W/DIFF, PT/INR, MAGNESIUM LEVEL Testing/Procedures: Your physician has requested that you have a cardiac catheterization.  Cardiac catheterization is used to diagnose and/or treat various heart conditions. Doctors may recommend this procedure for a number of different reasons. The most common reason is to evaluate chest pain. Chest pain can be a symptom of coronary artery disease (CAD), and cardiac catheterization can show whether plaque is narrowing or blocking your heart's arteries. This procedure is also used to evaluate the valves, as well as measure the blood flow and oxygen levels in different parts of your heart. For further information please visit [email or web address]. Please follow instruction sheet, as given. Follow-Up: Richardson Dopp, [facility] ON 04/06/16 @ 11:15 POST CATH Any Other Special Instructions Will Be Listed Below (If Applicable). If you need a refill on your cardiac medications before your next appointment, please call your pharmacy.  Signed, Richardson Dopp, PA-C  03/20/2016 3:28 PM    [facility] [place], [place], [place]  [address] Phone: [phone]; Fax: [phone]

## 2016-03-22 NOTE — Interval H&P Note (Signed)
History and Physical Interval Note:  03/22/2016 Cath Lab Visit (complete for each Cath Lab visit)  Clinical Evaluation Leading to the Procedure:   ACS: No.  Non-ACS:    Anginal Classification: CCS III  Anti-ischemic medical therapy: No Therapy  Non-Invasive Test Results: No non-invasive testing performed  Prior CABG: No previous CABG       9:56 AM  Sabrina [name]  has presented today for surgery, with the diagnosis of angina - hf  The various methods of treatment have been discussed with the patient and family. After consideration of risks, benefits and other options for treatment, the patient has consented to  Procedure(s): Right/Left Heart Cath and Coronary Angiography ([name]) as a surgical intervention .  The patient's history has been reviewed, patient examined, no change in status, stable for surgery.  I have reviewed the patient's chart and labs.  Questions were answered to the patient's satisfaction.     Shelva Majestic

## 2016-03-22 NOTE — Research (Signed)
CADLAD Informed Consent   Subject Name: Sabrina [name] [facility]  Subject met inclusion and exclusion criteria.  The informed consent form, study requirements and expectations were reviewed with the subject and questions and concerns were addressed prior to the signing of the consent form.  The subject verbalized understanding of the trail requirements.  The subject agreed to participate in the CADLAD trial and signed the informed consent.  The informed consent was obtained prior to performance of any protocol-specific procedures for the subject.  A copy of the signed informed consent was given to the subject and a copy was placed in the subject's medical record.  Sandie Ano 03/22/2016, 7:07

## 2016-03-22 NOTE — Progress Notes (Signed)
Site area: rfa/rfv Site Prior to Removal:  Level 0 Pressure Applied For:68min Manual: yes   Patient Status During Pull:  stable Post Pull Site:  Level 0 Post Pull Instructions Given:  yes Post Pull Pulses Present: palpable Dressing Applied:  tegaderm Bedrest begins @ 1200 till 1600 Comments:michel relieved by canderson

## 2016-03-23 ENCOUNTER — Encounter (HOSPITAL_COMMUNITY): Payer: Self-pay | Admitting: Cardiovascular Disease

## 2016-03-23 ENCOUNTER — Telehealth: Payer: Self-pay | Admitting: *Deleted

## 2016-03-23 ENCOUNTER — Other Ambulatory Visit: Payer: Self-pay | Admitting: Family Medicine

## 2016-03-23 MED ORDER — POTASSIUM CHLORIDE ER 20 MEQ PO TBCR: 20.0000 meq | EXTENDED_RELEASE_TABLET | Freq: Every day | ORAL | 6 refills | Status: DC

## 2016-03-23 MED FILL — Verapamil HCl IV Soln 2.5 MG/ML: INTRAVENOUS | Qty: 2 | Status: AC

## 2016-03-23 MED FILL — Nitroglycerin IV Soln 100 MCG/ML in D5W: INTRA_ARTERIAL | Qty: 10 | Status: AC

## 2016-03-23 NOTE — Telephone Encounter (Signed)
Lmtcb to go over lab results and recommendations.  

## 2016-03-23 NOTE — Telephone Encounter (Signed)
Lmtcb x 3 to go over results and recommendations. Since it is Friday 8/11 and pt's K+ it 3.2 and we have not been able to reach pt, I did lmom to start K+. The first dose of K+ will be 40 meq x 1; then change to K+ 20 meq daily. Advised to please call the office on Monday 03/26/16 to go over these results and recommendations. As well as advised she can call the office # [phone] over the weekend and will be able to speak with the ON-CALL Provider if she needs to clarify directions. I will send in Rx K+ tonight.

## 2016-03-26 ENCOUNTER — Telehealth: Payer: Self-pay | Admitting: Physician Assistant

## 2016-03-26 ENCOUNTER — Encounter: Payer: Self-pay | Admitting: *Deleted

## 2016-03-26 ENCOUNTER — Ambulatory Visit: Payer: 59 | Admitting: Cardiology

## 2016-03-26 DIAGNOSIS — I5031 Acute diastolic (congestive) heart failure: Secondary | ICD-10-CM

## 2016-03-26 DIAGNOSIS — I10 Essential (primary) hypertension: Secondary | ICD-10-CM

## 2016-03-26 NOTE — Telephone Encounter (Signed)
New message   Pt verbalized that she is returning call to carol

## 2016-03-26 NOTE — Telephone Encounter (Signed)
S/w pt today in regards to her lab results and recommendations. Pt advised to start K+ 20 meq with the first to be 40 meq, then change to 20 meq daily. BMET 8/18. Pt has appt w/PA 8/25 advised to keep appt. Pt agreeable to plan of care.

## 2016-03-26 NOTE — Telephone Encounter (Signed)
Lmtcb x 4 to go over lab results and med recommendations due to K+ 3.2. . I will send out a letter today asking pt tcb .

## 2016-03-30 ENCOUNTER — Other Ambulatory Visit: Payer: 59 | Admitting: *Deleted

## 2016-03-30 ENCOUNTER — Telehealth: Payer: Self-pay | Admitting: *Deleted

## 2016-03-30 ENCOUNTER — Ambulatory Visit: Payer: 59 | Admitting: Cardiology

## 2016-03-30 DIAGNOSIS — I5031 Acute diastolic (congestive) heart failure: Secondary | ICD-10-CM

## 2016-03-30 DIAGNOSIS — I10 Essential (primary) hypertension: Secondary | ICD-10-CM

## 2016-03-30 LAB — BASIC METABOLIC PANEL
BUN: 19 mg/dL (ref 7–25)
CALCIUM: 7.6 mg/dL — AB (ref 8.6–10.4)
CO2: 29 mmol/L (ref 20–31)
Chloride: 99 mmol/L (ref 98–110)
Creat: 1.06 mg/dL — ABNORMAL HIGH (ref 0.50–1.05)
GLUCOSE: 89 mg/dL (ref 65–99)
Potassium: 3.3 mmol/L — ABNORMAL LOW (ref 3.5–5.3)
SODIUM: 142 mmol/L (ref 135–146)

## 2016-03-30 MED ORDER — POTASSIUM CHLORIDE ER 20 MEQ PO TBCR: 40.0000 meq | EXTENDED_RELEASE_TABLET | Freq: Every day | ORAL | 11 refills | Status: DC

## 2016-03-30 NOTE — Telephone Encounter (Signed)
Per DOD Dr. Caryl Comes who advised pt to take K+ 40 meq daily until her f/u 8/25 w/Scott W. PA. Pt is agreeable to plan of care.

## 2016-04-05 NOTE — Progress Notes (Signed)
Cardiology Office Note:    Date:  04/06/2016   ID:  Sabrina, [name] 10/20/1960, MRN [id number]  PCP:  [name], MD  Cardiologist:  Dr. Fransico Him   Electrophysiologist:  n/a  Referring MD: Fayrene Helper, MD   Chief Complaint  Patient presents with  . Follow-up    CHF, pulmonary HTN    History of Present Illness:    Sabrina [name] is a 55 y.o. female with a hx of diastolic CHF, severe pulmonary HTN, morbid obesity, HTN, HL, GERD, DM2. She was evaluated by Dr. Fransico Him in 6/16 for surgical clearance prior to gastric sleeve surgery.  Myoview in 6/16 was low risk.  Echo in 6/16 demonstrated normal LVEF.    She recently was evaluated by primary care for CHF.  ECG was markedly abnormal with deep TWI in 2, 3, aVF, V1-6.  We set her up for an echo and cardiac cath.   Cardiac catheterization demonstrated normal coronary arteries. PASP was noted to be 114 mmHg consistent with severe pulmonary hypertension. Echocardiogram demonstrated normal LV function, mild diastolic dysfunction, findings consistent with RV pressure and volume overload, trivial MR, no mitral stenosis, mild TR, PASP 91 mmHg, trivial pericardial effusion.  She returns for follow-up.  She is here alone today. She is feeling much better. She denies further chest discomfort. Her breathing is improved.  She denies orthopnea, PND. Edema is improved.  She denies syncope.  She tells me she was test for OSA prior to her weight loss surgery and this was negative.    Prior CV studies that were reviewed today include:    Echo 03/22/16 EF 50-55%, normal wall motion, grade 1 diastolic dysfunction, diastolic and systolic flattening of ventricular septum consistent with RV pressure and volume overload, trivial MR, reduced RVSF, mild TR, PASP 91 mmHg, trivial pericardial effusion; severe pulmonary hypertension  LHC 03/22/16  The left ventricular systolic function is normal.  The left ventricular ejection  fraction is 55-65% by visual estimate.  Hemodynamic findings consistent with severe pulmonary hypertension.  PASP 114 mmHg Severe right ventricular systolic pressure and pulmonary hypertension with systolic pressures greater than 100 mmHg. Significant pulmonary capillary/LV gradient, without previous evidence of mitral stenosis on echocardiography. Normal coronary arteries. Normal LV systolic function with left ventricular hypertrophy. RECOMMENDATION: The patient will undergo 2-D echo Doppler study later today prior to being discharged.  The etiology of her severe pulmonary hypertension is unknown and consider possible  primary pulmonary hypertension.  Echo Doppler study will be helpful to further assess her mitral valve and potential for RV volume/pressure overload.  Myoview 6/16 There is no resting or stress perfusion defect consistent with no prior infarct and no ischemia. EF 73 %  The patient was hypertensive prior and during the study.  Echo 6/16 EF 60-65%, no RWMA.  Carotid US 8/08 Estimated stenosis in the right and left internal carotid arteries is 0-50% and 0-50%  Past Medical History:  Diagnosis Date  . Allergy   . Anemia   . Arthritis   . Asthma   . Chronic bronchitis ([place])   . Diabetes mellitus   . Endometriosis   . Fibroid uterus   . GERD (gastroesophageal reflux disease)   . History of Doppler ultrasound    a. Carotid US 8/08:Estimated stenosis in the right and left internal carotid arteries is 0-50% and 0-50%.  . History of echocardiogram    a. Echo 6/16: EF 60-65%, no RWMA.  . History of hiatal hernia   .  History of nuclear stress test    a. Myoview 6/16: There is no resting or stress perfusion defect consistent with no prior infarct and no ischemia. EF 73 %  The patient was hypertensive prior and during the study.  [name] Hx of iron deficiency anemia   . Hypersomnia with sleep apnea   . Hypertension   . 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 Menorrhagia   .  Obesity   . Pneumonia   . Secondary hyperthyroidism   . Thyroid disease   . Tinnitus    left ear  . Vertigo   . Vitamin D deficiency     Past Surgical History:  Procedure Laterality Date  . ABDOMINAL HYSTERECTOMY  2004   supracervical hysterectomy  . BREAST SURGERY  2005   reduction  . BREATH TEK H PYLORI [name] 12/24/2014   Procedure: BREATH TEK H PYLORI;  Surgeon: Greer Pickerel, MD;  Location: [facility];  Service: General;  Laterality: [name];  . CARDIAC CATHETERIZATION [name] 03/22/2016   Procedure: Right/Left Heart Cath and Coronary Angiography;  Surgeon: Troy Sine, MD;  Location: [facility];  Service: Cardiovascular;  Laterality: [name];  . DILATION AND CURETTAGE OF UTERUS     age 73  . ESOPHAGOGASTRODUODENOSCOPY (EGD) WITH PROPOFOL [name] 10/14/2015   Procedure: ESOPHAGOGASTRODUODENOSCOPY (EGD) WITH PROPOFOL;  Surgeon: Clarene Essex, MD;  Location: [facility];  Service: Endoscopy;  Laterality: [name];  . LAPAROSCOPIC GASTRIC SLEEVE RESECTION WITH HIATAL HERNIA REPAIR [name] 07/05/2015   Procedure: LAPAROSCOPIC GASTRIC SLEEVE RESECTION WITH HIATAL HERNIA REPAIR, upper endoscopy;  Surgeon: Greer Pickerel, MD;  Location: [facility];  Service: General;  Laterality: [name];    Current Medications: Outpatient Medications Prior to Visit  Medication Sig Dispense Refill  . aspirin EC 81 MG tablet Take 1 tablet (81 mg total) by mouth daily.    . furosemide (LASIX) 40 MG tablet Take 1 tablet (40 mg total) by mouth daily. 30 tablet 11  . metoprolol tartrate (LOPRESSOR) 25 MG tablet Take 0.5 tablets (12.5 mg total) by mouth 2 (two) times daily. 180 tablet 3  . Multiple Vitamin (MULTIVITAMIN WITH MINERALS) TABS tablet Take 1 tablet by mouth daily.    . pantoprazole (PROTONIX) 40 MG tablet Take 40 mg by mouth daily as needed (ACID REFLUX).    . Potassium Chloride ER 20 MEQ TBCR Take 40 mEq by mouth daily. 60 tablet 11  . triamcinolone ointment (KENALOG) 0.1 % APPLY TO ITCHING SPOTS ON THE BODY DAILY TO TWICE  DAILY AS NEEDED. NOT TO FACE. 454 g 0  . triamterene-hydrochlorothiazide (MAXZIDE-25) 37.5-25 MG tablet Take 1.5 tablets by mouth daily. ON HOLD TIL FINISHED LASIX    . ergocalciferol (VITAMIN D2) 50000 units capsule Take 1 capsule (50,000 Units total) by mouth once a week. One capsule once weekly (Patient taking differently: Take 50,000 Units by mouth once a week. One capsule once weekly, Tuesday) 4 capsule 11  . nitroGLYCERIN (NITROSTAT) 0.4 MG SL tablet Place 1 tablet (0.4 mg total) under the tongue every 5 (five) minutes as needed. 25 tablet 3  . ondansetron (ZOFRAN ODT) 4 MG disintegrating tablet Take 1 tablet (4 mg total) by mouth every 8 (eight) hours as needed for nausea or vomiting. 30 tablet 4   No facility-administered medications prior to visit.       Allergies:   Peanut-containing drug products; Potassium iodide; Ace inhibitors; Codeine; Doxycycline; Amlodipine; Latex; and Penicillins   Social History   Social History  . Marital status: Married  Spouse name: [name]  . Number of children: [name]  . Years of education: 57   Occupational History  . 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 Smoking status: Never Smoker  . Smokeless tobacco: Never Used  . Alcohol use No  . Drug use: No  . Sexual activity: Yes    Partners: Male    Birth control/ protection: Surgical     Comment: hysterectomy   Other Topics Concern  . None   Social History Narrative   Regular exercise-no   Caffeine Use-no           Family History:  The patient's family history includes Arthritis in her father, mother, and sister; Bleeding Disorder in her sister; Breast cancer in her sister; CAD in her mother; Cancer in her father; Colon cancer in her maternal grandfather; Depression in her father, mother, and sister; Diabetes in her father and mother; Hypertension in her father, mother, and sister; Stroke in her father and maternal aunt; Thyroid disease in her sister.     ROS:   Please see the history of present illness.    ROS All other systems reviewed and are negative.   EKGs/Labs/Other Test Reviewed:    EKG:  EKG is not ordered today.  The ekg ordered today demonstrates n/a  Recent Labs: 08/22/2015: ALT 10 03/16/2016: Brain Natriuretic Peptide 409.5 03/20/2016: Hemoglobin 13.6; Magnesium 1.8; Platelets 344 03/30/2016: BUN 19; Creat 1.06; Potassium 3.3; Sodium 142   Recent Lipid Panel    Component Value Date/Time   CHOL 111 (L) 03/10/2016 1038   TRIG 83 03/10/2016 1038   HDL 32 (L) 03/10/2016 1038   CHOLHDL 3.5 03/10/2016 1038   VLDL 17 03/10/2016 1038   LDLCALC 62 03/10/2016 1038     Physical Exam:    VS:  BP 110/80   Pulse 73   Ht 5' (1.524 m)   Wt 200 lb 1.9 oz (90.8 kg)   LMP 10/31/2002   BMI 39.08 kg/m     Wt Readings from Last 3 Encounters:  04/06/16 200 lb 1.9 oz (90.8 kg)  03/22/16 203 lb (92.1 kg)  03/20/16 203 lb (92.1 kg)     Physical Exam  Constitutional: She is oriented to person, place, and time. She appears well-developed and well-nourished. No distress.  HENT:  Head: Normocephalic and atraumatic.  Eyes: No scleral icterus.  Neck: Normal range of motion. No JVD present.  Cardiovascular: Normal rate, regular rhythm, S1 normal and S2 normal.  Exam reveals no gallop and no friction rub.   No murmur heard. Pulmonary/Chest: Breath sounds normal. She has no wheezes. She has no rhonchi. She has no rales.  Abdominal: Soft. There is no tenderness.  Musculoskeletal: She exhibits no edema.  R groin without hematoma or bruit  Neurological: She is alert and oriented to person, place, and time.  Skin: Skin is warm and dry.  Psychiatric: She has a normal mood and affect.    ASSESSMENT:    1. Chronic diastolic heart failure (HCC)   2. Pulmonary HTN ([place])   3. Essential hypertension   4. Prolonged Q-T interval on ECG    PLAN:    In order of problems listed above:  1. Diastolic CHF - Volume is stable. She is  improved.  LVEDP 8 mmHg at cath. She is NYHA 2. Continue current dose of Lasix. Recent K+ low. Will get FU BMET today.   2. Severe Pulmonary HTN - She has no MV disease noted on  Echo.  Echo from 2016 without mention of pulmonary pressures.  She tells me she had a neg sleep study prior to her weight loss surgery.  Question primary pulmonary HTN.  Discussed pulmonary HTN with the patient today.  Will refer to Dr. Lamonte Sakai for evaluation.    3. HTN -  Controlled.   4. Prolonged QT - Avoid QT prolonging drugs.   Medication Adjustments/Labs and Tests Ordered: Current medicines are reviewed at length with the patient today.  Concerns regarding medicines are outlined above.  Medication changes, Labs and Tests ordered today are outlined in the Patient Instructions noted below. Patient Instructions  Medication Instructions:  Your physician recommends that you continue on your current medications as directed. Please refer to the Current Medication list given to you today. Labwork: TODAY BMET Testing/Procedures: NONE Follow-Up: 1. DR. TURNER ON 06/22/06 @ 9:30 2. YOU ARE BEING REFERRED TO [facility] ; DX PULMONARY HTN Any Other Special Instructions Will Be Listed Below (If Applicable). If you need a refill on your cardiac medications before your next appointment, please call your pharmacy.  Signed, Richardson Dopp, PA-C  04/06/2016 12:02 PM    [facility] [address], [place], [place]  [address] Phone: [phone]; Fax: [phone]

## 2016-04-06 ENCOUNTER — Ambulatory Visit: Payer: 59 | Admitting: Cardiology

## 2016-04-06 ENCOUNTER — Encounter: Payer: Self-pay | Admitting: Physician Assistant

## 2016-04-06 ENCOUNTER — Ambulatory Visit (INDEPENDENT_AMBULATORY_CARE_PROVIDER_SITE_OTHER): Payer: 59 | Admitting: Physician Assistant

## 2016-04-06 VITALS — BP 110/80 | HR 73 | Ht 60.0 in | Wt 200.1 lb

## 2016-04-06 DIAGNOSIS — I272 Other secondary pulmonary hypertension: Secondary | ICD-10-CM | POA: Diagnosis not present

## 2016-04-06 DIAGNOSIS — I10 Essential (primary) hypertension: Secondary | ICD-10-CM | POA: Diagnosis not present

## 2016-04-06 DIAGNOSIS — I4581 Long QT syndrome: Secondary | ICD-10-CM | POA: Diagnosis not present

## 2016-04-06 DIAGNOSIS — I2721 Secondary pulmonary arterial hypertension: Secondary | ICD-10-CM | POA: Insufficient documentation

## 2016-04-06 DIAGNOSIS — I5032 Chronic diastolic (congestive) heart failure: Secondary | ICD-10-CM | POA: Diagnosis not present

## 2016-04-06 DIAGNOSIS — R9431 Abnormal electrocardiogram [ECG] [EKG]: Secondary | ICD-10-CM

## 2016-04-06 LAB — BASIC METABOLIC PANEL
BUN: 23 mg/dL (ref 7–25)
CALCIUM: 8.1 mg/dL — AB (ref 8.6–10.4)
CHLORIDE: 100 mmol/L (ref 98–110)
CO2: 31 mmol/L (ref 20–31)
Creat: 1.15 mg/dL — ABNORMAL HIGH (ref 0.50–1.05)
GLUCOSE: 91 mg/dL (ref 65–99)
POTASSIUM: 3.9 mmol/L (ref 3.5–5.3)
SODIUM: 141 mmol/L (ref 135–146)

## 2016-04-06 NOTE — Patient Instructions (Addendum)
Medication Instructions:  Your physician recommends that you continue on your current medications as directed. Please refer to the Current Medication list given to you today. Labwork: TODAY BMET Testing/Procedures: NONE Follow-Up: 1. DR. TURNER ON 06/22/06 @ 9:30 2. YOU ARE BEING REFERRED TO [facility] ; DX PULMONARY HTN Any Other Special Instructions Will Be Listed Below (If Applicable). If you need a refill on your cardiac medications before your next appointment, please call your pharmacy.

## 2016-04-09 ENCOUNTER — Telehealth: Payer: Self-pay | Admitting: *Deleted

## 2016-04-09 DIAGNOSIS — I5031 Acute diastolic (congestive) heart failure: Secondary | ICD-10-CM

## 2016-04-09 DIAGNOSIS — I10 Essential (primary) hypertension: Secondary | ICD-10-CM

## 2016-04-09 NOTE — Telephone Encounter (Signed)
Lmtcb to go over lab results and to repeat bmet in 1 month.

## 2016-04-10 ENCOUNTER — Other Ambulatory Visit: Payer: Self-pay | Admitting: Family Medicine

## 2016-04-10 DIAGNOSIS — L989 Disorder of the skin and subcutaneous tissue, unspecified: Secondary | ICD-10-CM

## 2016-04-11 NOTE — Telephone Encounter (Signed)
Lmtcb x 2 to go over lab results and recommendation to repeat bmet in 1 month.

## 2016-04-23 ENCOUNTER — Telehealth: Payer: Self-pay | Admitting: *Deleted

## 2016-04-23 ENCOUNTER — Encounter: Payer: Self-pay | Admitting: *Deleted

## 2016-04-23 DIAGNOSIS — I5032 Chronic diastolic (congestive) heart failure: Secondary | ICD-10-CM

## 2016-04-23 DIAGNOSIS — I5031 Acute diastolic (congestive) heart failure: Secondary | ICD-10-CM

## 2016-04-23 NOTE — Telephone Encounter (Signed)
Lmtcb x 3 to go over lab results and recommendation to repeat Bmet in 1 month. I will send out letter to pt today asking to call the office

## 2016-04-24 ENCOUNTER — Encounter: Payer: Self-pay | Admitting: *Deleted

## 2016-04-27 ENCOUNTER — Ambulatory Visit: Payer: 59 | Admitting: Dietician

## 2016-05-09 NOTE — Telephone Encounter (Signed)
Ptcb this morning and has been notified of lab results. Pt said it is ok to call her at work since that is where she is all day and that is why it is hard to reach her. Pt agreeable to come in Friday for bmet.

## 2016-05-11 ENCOUNTER — Other Ambulatory Visit: Payer: 59

## 2016-05-18 ENCOUNTER — Other Ambulatory Visit (INDEPENDENT_AMBULATORY_CARE_PROVIDER_SITE_OTHER): Payer: 59 | Admitting: *Deleted

## 2016-05-18 DIAGNOSIS — I5031 Acute diastolic (congestive) heart failure: Secondary | ICD-10-CM | POA: Diagnosis not present

## 2016-05-18 DIAGNOSIS — I5032 Chronic diastolic (congestive) heart failure: Secondary | ICD-10-CM

## 2016-05-18 DIAGNOSIS — I10 Essential (primary) hypertension: Secondary | ICD-10-CM

## 2016-05-18 LAB — BASIC METABOLIC PANEL
BUN: 20 mg/dL (ref 7–25)
CALCIUM: 7.4 mg/dL — AB (ref 8.6–10.4)
CO2: 29 mmol/L (ref 20–31)
CREATININE: 1.2 mg/dL — AB (ref 0.50–1.05)
Chloride: 100 mmol/L (ref 98–110)
GLUCOSE: 76 mg/dL (ref 65–99)
Potassium: 3.8 mmol/L (ref 3.5–5.3)
Sodium: 141 mmol/L (ref 135–146)

## 2016-05-23 ENCOUNTER — Encounter: Payer: Self-pay | Admitting: Internal Medicine

## 2016-05-25 ENCOUNTER — Encounter: Payer: Self-pay | Admitting: Emergency Medicine

## 2016-05-25 ENCOUNTER — Ambulatory Visit (INDEPENDENT_AMBULATORY_CARE_PROVIDER_SITE_OTHER): Payer: 59 | Admitting: Emergency Medicine

## 2016-05-25 ENCOUNTER — Other Ambulatory Visit: Payer: 59

## 2016-05-25 VITALS — BP 118/80 | HR 68 | Ht 60.0 in | Wt 207.0 lb

## 2016-05-25 DIAGNOSIS — I749 Embolism and thrombosis of unspecified artery: Secondary | ICD-10-CM

## 2016-05-25 DIAGNOSIS — I272 Pulmonary hypertension, unspecified: Secondary | ICD-10-CM

## 2016-05-25 NOTE — Patient Instructions (Addendum)
We will perform a walking oximetry today on room air.  We will arrange for a ventilation / perfusion scan.   We will perform lab work today We will plan to repeat your sleep study.  Follow with Dr Lamonte Sakai next available after your testing is done to review.

## 2016-05-25 NOTE — Progress Notes (Signed)
Subjective:    Patient ID: Sabrina [name], female    DOB: 1960/10/12, 55 y.o.   MRN: [id number]  HPI Sabrina [name] is a 55 year old never smoker with a history of diastolic CHF, systemic hypertension, diabetes, obesity with ?? obstructive sleep apnea. She had a sleep study at [facility] before her gastric sgy. With a history of possible Lupus followed at [facility] and locally by Dr Tonia Brooms. She was seen by Rheum and it sounds like she was serology negative. Underwent gastric sleeve for wt loss (2016) by Dr Redmond Pulling, well tolerated. She took Phen-Fen for about 6 months, about 2 years.   She has been under evaluation for her CHF and is followed at [facility] heart care. She has been on lasix for the last month or so, feels that her breathing is better.  She underwent left and right cardiac catheterization 03/22/16 as detailed below, shows significant elevation PA pressures. No MV or TV disease noted.     Echo 03/22/16 EF 50-55%, normal wall motion, grade 1 diastolic dysfunction, diastolic and systolic flattening of ventricular septum consistent with RV pressure and volume overload, trivial MR, reduced RVSF, mild TR, PASP 91 mmHg, trivial pericardial effusion; severe pulmonary hypertension  LHC 03/22/16  The left ventricular systolic function is normal.  The left ventricular ejection fraction is 55-65% by visual estimate.  Hemodynamic findings consistent with severe pulmonary hypertension.  PASP 114 mmHg Severe right ventricular systolic pressure and pulmonary hypertension with systolic pressures greater than 100 mmHg. Significant pulmonary capillary/LV gradient, without previous evidence of mitral stenosis on echocardiography. Normal coronary arteries. Normal LV systolic function with left ventricular hypertrophy. RECOMMENDATION: The patient will undergo 2-D echo Doppler study later today prior to being discharged. The etiology of her severe pulmonary hypertension is unknown and consider possible  primary pulmonary hypertension. Echo Doppler study will be helpful to further assess her mitral valve and potential for RV volume/pressure overload  Review of Systems  Constitutional: Negative.  Negative for fever and unexpected weight change.  HENT: Positive for sinus pressure and sneezing. Negative for congestion, dental problem, ear pain, nosebleeds, postnasal drip, rhinorrhea, sore throat and trouble swallowing.   Eyes: Positive for itching. Negative for redness.  Respiratory: Positive for cough, shortness of breath and wheezing. Negative for chest tightness.   Cardiovascular: Negative.  Negative for palpitations and leg swelling.  Gastrointestinal: Negative.  Negative for nausea and vomiting.  Endocrine: Negative.   Genitourinary: Negative.  Negative for dysuria.  Musculoskeletal: Negative.  Negative for joint swelling.  Skin: Negative.  Negative for rash.  Allergic/Immunologic: Positive for environmental allergies.  Neurological: Negative.  Negative for headaches.  Hematological: Bruises/bleeds easily.  Psychiatric/Behavioral: Negative.  Negative for dysphoric mood. The patient is not nervous/anxious.     Past Medical History:  Diagnosis Date  . Allergy   . Anemia   . Arthritis   . Asthma   . Chronic bronchitis ([place])   . Diabetes mellitus   . Endometriosis   . Fibroid uterus   . GERD (gastroesophageal reflux disease)   . History of Doppler ultrasound    a. Carotid US 8/08:Estimated stenosis in the right and left internal carotid arteries is 0-50% and 0-50%.  . History of echocardiogram    a. Echo 6/16: EF 60-65%, no RWMA.  . History of hiatal hernia   . History of nuclear stress test    a. Myoview 6/16: There is no resting or stress perfusion defect consistent with no prior infarct and no ischemia. EF 73 %  The patient was hypertensive prior and during the study.  [name] Hx of iron deficiency anemia   . Hypersomnia with sleep apnea   . Hypertension   . 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 Menorrhagia   . Obesity   . Pneumonia   . Secondary hyperthyroidism   . Thyroid disease   . Tinnitus    left ear  . Vertigo   . Vitamin D deficiency      Family History  Problem Relation Age of Onset  . Hypertension Mother   . Diabetes Mother   . Arthritis Mother   . Depression Mother   . CAD Mother     s/p PCI  . Diabetes Father   . Stroke Father   . Arthritis Father   . Depression Father   . Hypertension Father   . Cancer Father     prostate  . Hypertension Sister     x 2  . Breast cancer Sister   . Arthritis Sister   . Bleeding Disorder Sister   . Depression Sister   . Thyroid disease Sister   . Colon cancer Maternal Grandfather   . Stroke Maternal Aunt   . Heart attack Neg Hx     Works as a Engineer, production, desk job.   Social History   Social History  . Marital status: Married    Spouse name: [name]  . Number of children: [name]  . Years of education: 70   Occupational History  . 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 Smoking status: Never Smoker  . Smokeless tobacco: Never Used  . Alcohol use No  . Drug use: No  . Sexual activity: Yes    Partners: Male    Birth control/ protection: Surgical     Comment: hysterectomy   Other Topics Concern  . Not on file   Social History Narrative   Regular exercise-no   Caffeine Use-no           Allergies  Allergen Reactions  . Peanut-Containing Drug Products Nausea And Vomiting  . Potassium Iodide Other (See Comments)    facial swelling, also eyes, and ears  . Ace Inhibitors Cough  . Codeine Nausea And Vomiting  . Doxycycline Hives  . Amlodipine Rash  . Latex Hives and Rash  . Penicillins Hives, Itching and Rash    .Marland KitchenHas patient had a PCN reaction causing immediate rash, facial/tongue/throat swelling, SOB or lightheadedness with hypotension: No Has patient had a PCN reaction causing severe rash involving mucus membranes or skin necrosis: No Has  patient had a PCN reaction that required hospitalization No Has patient had a PCN reaction occurring within the last 10 years: YES (3 years ago)  If all of the above answers are "NO", then may proceed with Cephalosporin use.      Outpatient Medications Prior to Visit  Medication Sig Dispense Refill  . aspirin EC 81 MG tablet Take 1 tablet (81 mg total) by mouth daily.    . ergocalciferol (VITAMIN D2) 50000 units capsule Take 50,000 Units by mouth once a week.    . furosemide (LASIX) 40 MG tablet Take 1 tablet (40 mg total) by mouth daily. 30 tablet 11  . Multiple Vitamin (MULTIVITAMIN WITH MINERALS) TABS tablet Take 1 tablet by mouth daily.    . nitroGLYCERIN (NITROSTAT) 0.4 MG SL tablet Place 0.4 mg under the tongue every 5 (five) minutes as needed for chest pain (3 DOSES MAX).    [name]  pantoprazole (PROTONIX) 40 MG tablet Take 40 mg by mouth daily as needed (ACID REFLUX).    [name] triamcinolone ointment (KENALOG) 0.1 % APPLY TO ITCHING SPOTS ON THE BODY DAILY TO TWICE DAILY AS NEEDED. NOT TO FACE. 454 g 0  . triamterene-hydrochlorothiazide (MAXZIDE-25) 37.5-25 MG tablet Take 1.5 tablets by mouth daily. ON HOLD TIL FINISHED LASIX    . metoprolol tartrate (LOPRESSOR) 25 MG tablet Take 0.5 tablets (12.5 mg total) by mouth 2 (two) times daily. (Patient not taking: Reported on 05/25/2016) 180 tablet 3  . Potassium Chloride ER 20 MEQ TBCR Take 40 mEq by mouth daily. (Patient not taking: Reported on 05/25/2016) 60 tablet 11   No facility-administered medications prior to visit.         Objective:   Physical Exam Vitals:   05/25/16 1411  BP: 118/80  Pulse: 68  SpO2: 100%  Weight: 207 lb (93.9 kg)  Height: 5' (1.524 m)   Gen: Pleasant, overwt woman, in no distress,  normal affect  ENT: No lesions,  mouth clear,  oropharynx clear, no postnasal drip  Neck: No JVD, no TMG, no carotid bruits  Lungs: No use of accessory muscles, clear without rales or rhonchi  Cardiovascular: RRR, heart sounds  normal, no murmur or gallops, no peripheral edema  Musculoskeletal: No deformities, no cyanosis or clubbing  Neuro: alert, non focal  Skin: Warm, no lesions or rashes      Assessment & Plan:  Pulmonary HTN Significant pulmonary hypertension identified on her left heart cath in August. This appears before out of proportion to her systemic hypertension and her diastolic dysfunction. There is no evidence of valvular disease on her echocardiogram. Interestingly her PA pressures on echo from 2016 were read as normal. Question whether her pressures were somewhat overestimated due to the fact that they were measured when she was volume overloaded. Still I believe that there is true PAH here. Etiologies include possible thromboembolic disease, contribution of obstructive sleep apnea, particularly given the possible history of SLE. Also include fact that she did use Phen-Fen for 6 months. Will initiate workup. Suspect that she will merit therapy. Will need to discuss the disparity in echos, the potential for anticoagulation with Dr Radford Pax and Richardson Dopp.   We will perform a walking oximetry today on room air.  We will arrange for a ventilation / perfusion scan.   We will perform lab work today > ANA, RF, a-Scl-70, ACE level, RNP We will plan to repeat your sleep study.  Follow with Dr Lamonte Sakai next available after your testing is done to review.   Baltazar Apo, MD, PhD 05/25/2016, 2:54 PM  Pulmonary and Critical Care [phone] or if no answer [phone]

## 2016-05-25 NOTE — Assessment & Plan Note (Signed)
Significant pulmonary hypertension identified on her left heart cath in August. This appears before out of proportion to her systemic hypertension and her diastolic dysfunction. There is no evidence of valvular disease on her echocardiogram. Interestingly her PA pressures on echo from 2016 were read as normal. Question whether her pressures were somewhat overestimated due to the fact that they were measured when she was volume overloaded. Still I believe that there is true PAH here. Etiologies include possible thromboembolic disease, contribution of obstructive sleep apnea, particularly given the possible history of SLE. Also include fact that she did use Phen-Fen for 6 months. Will initiate workup. Suspect that she will merit therapy. Will need to discuss the disparity in echos, the potential for anticoagulation with Dr Radford Pax and Richardson Dopp.   We will perform a walking oximetry today on room air.  We will arrange for a ventilation / perfusion scan.   We will perform lab work today > ANA, RF, a-Scl-70, ACE level, RNP We will plan to repeat your sleep study.  Follow with Dr Lamonte Sakai next available after your testing is done to review.

## 2016-05-27 LAB — RHEUMATOID FACTOR: Rhuematoid fact SerPl-aCnc: 36 IU/mL — ABNORMAL HIGH (ref <14)

## 2016-05-28 LAB — ANTI-NUCLEAR AB-TITER (ANA TITER): ANA Titer 1: >=1:1280 {titer} — ABNORMAL HIGH

## 2016-05-28 LAB — ANTI-SCLERODERMA ANTIBODY: SCLERODERMA (SCL-70) (ENA) ANTIBODY, IGG: NEGATIVE

## 2016-05-28 LAB — RNP ANTIBODY: Ribonucleic Protein(ENA) Antibody, IgG: <1

## 2016-05-28 LAB — ANA: Anti Nuclear Antibody(ANA): POSITIVE — AB

## 2016-06-01 ENCOUNTER — Other Ambulatory Visit: Payer: Self-pay | Admitting: Emergency Medicine

## 2016-06-01 DIAGNOSIS — I749 Embolism and thrombosis of unspecified artery: Secondary | ICD-10-CM

## 2016-06-01 DIAGNOSIS — I272 Pulmonary hypertension, unspecified: Secondary | ICD-10-CM

## 2016-06-04 ENCOUNTER — Ambulatory Visit (HOSPITAL_COMMUNITY)
Admission: RE | Admit: 2016-06-04 | Discharge: 2016-06-04 | Disposition: A | Discharge status: Home / Self Care | Payer: 59 | Source: Ambulatory Visit | Attending: Emergency Medicine | Admitting: Emergency Medicine

## 2016-06-04 ENCOUNTER — Encounter (HOSPITAL_COMMUNITY): Payer: Self-pay

## 2016-06-04 ENCOUNTER — Encounter (HOSPITAL_COMMUNITY)
Admission: RE | Admit: 2016-06-04 | Discharge: 2016-06-04 | Disposition: A | Discharge status: Home / Self Care | Payer: 59 | Source: Ambulatory Visit | Attending: Emergency Medicine | Admitting: Emergency Medicine

## 2016-06-04 DIAGNOSIS — I272 Pulmonary hypertension, unspecified: Secondary | ICD-10-CM | POA: Insufficient documentation

## 2016-06-04 DIAGNOSIS — I749 Embolism and thrombosis of unspecified artery: Secondary | ICD-10-CM | POA: Insufficient documentation

## 2016-06-04 MED ORDER — SODIUM CHLORIDE 0.9% FLUSH
INTRAVENOUS | Status: AC
  Filled 2016-06-04: qty 60

## 2016-06-04 MED ORDER — TECHNETIUM TO 99M ALBUMIN AGGREGATED
4.0000 | Freq: Once | INTRAVENOUS | Status: AC | PRN
  Administered 2016-06-04: 4.1 via INTRAVENOUS

## 2016-06-04 MED ORDER — TECHNETIUM TC 99M DIETHYLENETRIAME-PENTAACETIC ACID
30.0000 | Freq: Once | INTRAVENOUS | Status: AC | PRN
  Administered 2016-06-04: 31 via RESPIRATORY_TRACT

## 2016-06-07 ENCOUNTER — Ambulatory Visit (INDEPENDENT_AMBULATORY_CARE_PROVIDER_SITE_OTHER): Payer: 59 | Admitting: Family Medicine

## 2016-06-07 ENCOUNTER — Encounter: Payer: Self-pay | Admitting: Family Medicine

## 2016-06-07 VITALS — BP 136/84 | HR 103 | Temp 98.9°F | Ht 60.0 in | Wt 204.1 lb

## 2016-06-07 DIAGNOSIS — E8881 Metabolic syndrome: Secondary | ICD-10-CM

## 2016-06-07 DIAGNOSIS — J302 Other seasonal allergic rhinitis: Secondary | ICD-10-CM

## 2016-06-07 DIAGNOSIS — R05 Cough: Secondary | ICD-10-CM

## 2016-06-07 DIAGNOSIS — E559 Vitamin D deficiency, unspecified: Secondary | ICD-10-CM | POA: Diagnosis not present

## 2016-06-07 DIAGNOSIS — E1129 Type 2 diabetes mellitus with other diabetic kidney complication: Secondary | ICD-10-CM

## 2016-06-07 DIAGNOSIS — R110 Nausea: Secondary | ICD-10-CM

## 2016-06-07 DIAGNOSIS — Z794 Long term (current) use of insulin: Secondary | ICD-10-CM

## 2016-06-07 DIAGNOSIS — R058 Other specified cough: Secondary | ICD-10-CM

## 2016-06-07 DIAGNOSIS — Z6841 Body Mass Index (BMI) 40.0 and over, adult: Secondary | ICD-10-CM

## 2016-06-07 DIAGNOSIS — I10 Essential (primary) hypertension: Secondary | ICD-10-CM | POA: Diagnosis not present

## 2016-06-07 MED ORDER — PROMETHAZINE-DM 6.25-15 MG/5ML PO SYRP: ORAL_SOLUTION | ORAL | 0 refills | Status: DC

## 2016-06-07 MED ORDER — ERGOCALCIFEROL 1.25 MG (50000 UT) PO CAPS: 50000.0000 [IU] | ORAL_CAPSULE | ORAL | 3 refills | Status: DC

## 2016-06-07 MED ORDER — MONTELUKAST SODIUM 10 MG PO TABS: 10.0000 mg | ORAL_TABLET | Freq: Every day | ORAL | 3 refills | Status: DC

## 2016-06-07 MED ORDER — ONDANSETRON 4 MG PO TBDP: ORAL_TABLET | ORAL | 2 refills | Status: DC

## 2016-06-07 MED ORDER — PREDNISONE 5 MG (21) PO TBPK: 5.0000 mg | ORAL_TABLET | ORAL | 0 refills | Status: DC

## 2016-06-07 MED ORDER — METHYLPREDNISOLONE ACETATE 80 MG/ML IJ SUSP
80.0000 mg | Freq: Once | INTRAMUSCULAR | Status: AC
  Administered 2016-06-07: 80 mg via INTRAMUSCULAR

## 2016-06-07 NOTE — Progress Notes (Signed)
Sabrina [name]     MRN: [id number]      DOB: [birth year]   HPI Sabrina [name] is here for follow up and re-evaluation of chronic medical conditions, medication management and review of any available recent lab and radiology data.  Preventive health is updated, specifically  Cancer screening and Immunization.   Questions or concerns regarding consultations or procedures which the PT has had in the interim are  addressed. The PT denies any adverse reactions to current medications since the last visit.  5 day h/o dry cough day and night, with chest tightness, watery eyes, no fever , chills, no sputum , no body aches, , fatigue and poor sleep with headache, frontal, no upper drainage, ear, pain or sore throat. C/o frontal pressure and tightness. C/o daily, chronic nausea ROS Denies recent fever or chills. Denies , nasal congestion, ear pain or sore throat.  Denies chest pains, palpitations and leg swelling Denies abdominal pain, , vomiting,diarrhea or constipation.   Denies dysuria, frequency, hesitancy or incontinence. Denies joint pain, swelling and limitation in mobility. Denies headaches, seizures, numbness, or tingling. Denies depression, anxiety or insomnia. Denies skin break down or rash.   PE  BP 136/90   Pulse (!) 103   Temp 98.9 F (37.2 C) (Oral)   Ht 5' (1.524 m)   Wt 204 lb 1.9 oz (92.6 kg)   LMP 10/31/2002   SpO2 94%   BMI 39.86 kg/m   Patient alert and oriented and in no cardiopulmonary distress.  HEENT: No facial asymmetry, EOMI,   oropharynx pink and moist.  Neck supple no JVD, no mass.  Chest: Adequate air entry, no crackles, few wheezes  CVS: S1, S2 no murmurs, no S3.Regular rate.  ABD: Soft non tender.   Ext: No edema  MS: Adequate ROM spine, shoulders, hips and knees.  Skin: Intact, no ulcerations or rash noted.  Psych: Good eye contact, normal affect. Memory intact not anxious or depressed appearing.  CNS: CN 2-12 intact, power,  normal  throughout.no focal deficits noted.   Assessment & Plan  Essential hypertension Controlled, no change in medication DASH diet and commitment to daily physical activity for a minimum of 30 minutes discussed and encouraged, as a part of hypertension management. The importance of attaining a healthy weight is also discussed.  BP/Weight 06/07/2016 05/25/2016 04/06/2016 03/22/2016 03/20/2016 03/14/2016 [id number]  Systolic BP [id number] 123456 [id number] 123456 0000000  Diastolic BP 84 80 80 90 90 88 82  Wt. (Lbs) 204.12 207 200.12 203 203 214 210  BMI 39.86 40.43 39.08 39.65 39.65 41.79 41.34       Allergic cough Uncontrolled, depo medrol and prednisone dose pack  Diabetes mellitus with renal manifestation (HCC) Controlled, no change in management. Sabrina [name] is reminded of the importance of commitment to daily physical activity for 30 minutes or more, as able and the need to limit carbohydrate intake to 30 to 60 grams per meal to help with blood sugar control.   The need to take medication as prescribed, test blood sugar as directed, and to call between visits if there is a concern that blood sugar is uncontrolled is also discussed.   Sabrina [name] is reminded of the importance of daily foot exam, annual eye examination, and good blood sugar, blood pressure and cholesterol control.  Diabetic Labs Latest Ref Rng & Units 05/18/2016 04/06/2016 03/30/2016 03/20/2016 03/10/2016  HbA1c <5.7 % - - - - 6.3(H)  Microalbumin Not estab mg/dL - - - - -  Micro/Creat Ratio <30 mcg/mg creat - - - - -  Chol 125 - 200 mg/dL - - - - 111(L)  HDL >=46 mg/dL - - - - 32(L)  Calc LDL <130 mg/dL - - - - 62  Triglycerides <150 mg/dL - - - - 83  Creatinine 0.50 - 1.05 mg/dL 1.20(H) 1.15(H) 1.06(H) 1.22(H) 1.10(H)   BP/Weight 06/07/2016 05/25/2016 04/06/2016 03/22/2016 03/20/2016 03/14/2016 [id number]  Systolic BP XX123456 123456 A999333 [id number] [id number] 123456 0000000  Diastolic BP 84 80 80 90 90 88 82  Wt. (Lbs) 204.12 207 200.12 203 203 214 210  BMI 39.86  40.43 39.08 39.65 39.65 41.79 41.34   Foot/eye exam completion dates Latest Ref Rng & Units 10/18/2015 09/15/2014  Eye Exam No Retinopathy - No Retinopathy  Foot exam Order - - -  Foot Form Completion - Done -        Morbid obesity with BMI of 45.0-49.9, adult (HCC) Deteriorated. Patient re-educated about  the importance of commitment to a  minimum of 150 minutes of exercise per week.  The importance of healthy food choices with portion control discussed. Encouraged to start a food diary, count calories and to consider  joining a support group. Sample diet sheets offered. Goals set by the patient for the next several months.   Weight /BMI 06/07/2016 05/25/2016 04/06/2016  WEIGHT 204 lb 1.9 oz 207 lb 200 lb 1.9 oz  HEIGHT 5\' 0"  5\' 0"  5\' 0"   BMI 39.86 kg/m2 40.43 kg/m2 39.08 kg/m2      Nausea without vomiting Uncontrolled daily nausea, zofran one daily as needed, prescribed

## 2016-06-07 NOTE — Patient Instructions (Signed)
F/u in 4 months, call if you need me sooner  You are treated for uncontrolled allergies with cough and wheeze  Depo medrol in office followed by 6 day course of prednisone , also daily singulair and cough suppressant medication, as needed.  Call in next 1 to 1.5 weeks if you develop fever , chills, yellow sputum or worsen, I will send in antibiotic course   Fasting labs in 4 months  Vit D refilled and Zofran prescribed for as needed use  Please work on good  health habits so that your health will improve. 1. Commitment to daily physical activity for 30 to 60  minutes, if you are able to do this.  2. Commitment to wise food choices. Aim for half of your  food intake to be vegetable and fruit, one quarter starchy foods, and one quarter protein. Try to eat on a regular schedule  3 meals per day, snacking between meals should be limited to vegetables or fruits or small portions of nuts. 64 ounces of water per day is generally recommended, unless you have specific health conditions, like heart failure or kidney failure where you will need to limit fluid intake.  3. Commitment to sufficient and a  good quality of physical and mental rest daily, generally between 6 to 8 hours per day.  WITH PERSISTANCE AND PERSEVERANCE, THE IMPOSSIBLE , BECOMES THE NORM! Thank you  for choosing [facility]. We consider it a privelige to serve you.  Delivering excellent health care in a caring and  compassionate way is our goal.  Partnering with you,  so that together we can achieve this goal is our strategy.

## 2016-06-10 ENCOUNTER — Encounter: Payer: Self-pay | Admitting: Family Medicine

## 2016-06-10 NOTE — Assessment & Plan Note (Signed)
Controlled, no change in medication DASH diet and commitment to daily physical activity for a minimum of 30 minutes discussed and encouraged, as a part of hypertension management. The importance of attaining a healthy weight is also discussed.  BP/Weight 06/07/2016 05/25/2016 04/06/2016 03/22/2016 03/20/2016 03/14/2016 [id number]  Systolic BP XX123456 123456 A999333 [id number] A999333 123456 0000000  Diastolic BP 84 80 80 90 90 88 82  Wt. (Lbs) 204.12 207 200.12 203 203 214 210  BMI 39.86 40.43 39.08 39.65 39.65 41.79 41.34

## 2016-06-10 NOTE — Assessment & Plan Note (Signed)
Deteriorated. Patient re-educated about  the importance of commitment to a  minimum of 150 minutes of exercise per week.  The importance of healthy food choices with portion control discussed. Encouraged to start a food diary, count calories and to consider  joining a support group. Sample diet sheets offered. Goals set by the patient for the next several months.   Weight /BMI 06/07/2016 05/25/2016 04/06/2016  WEIGHT 204 lb 1.9 oz 207 lb 200 lb 1.9 oz  HEIGHT 5\' 0"  5\' 0"  5\' 0"   BMI 39.86 kg/m2 40.43 kg/m2 39.08 kg/m2

## 2016-06-10 NOTE — Assessment & Plan Note (Addendum)
Uncontrolled daily nausea, zofran one daily as needed, prescribed

## 2016-06-10 NOTE — Assessment & Plan Note (Addendum)
Controlled, no change in management. [name] is reminded of the importance of commitment to daily physical activity for 30 minutes or more, as able and the need to limit carbohydrate intake to 30 to 60 grams per meal to help with blood sugar control.   The need to take medication as prescribed, test blood sugar as directed, and to call between visits if there is a concern that blood sugar is uncontrolled is also discussed.   [name] is reminded of the importance of daily foot exam, annual eye examination, and good blood sugar, blood pressure and cholesterol control.  Diabetic Labs Latest Ref Rng & Units 05/18/2016 04/06/2016 03/30/2016 03/20/2016 03/10/2016  HbA1c <5.7 % - - - - 6.3(H)  Microalbumin Not estab mg/dL - - - - -  Micro/Creat Ratio <30 mcg/mg creat - - - - -  Chol 125 - 200 mg/dL - - - - 111(L)  HDL >=46 mg/dL - - - - 32(L)  Calc LDL <130 mg/dL - - - - 62  Triglycerides <150 mg/dL - - - - 83  Creatinine 0.50 - 1.05 mg/dL 1.20(H) 1.15(H) 1.06(H) 1.22(H) 1.10(H)   BP/Weight 06/07/2016 05/25/2016 04/06/2016 03/22/2016 03/20/2016 03/14/2016 [id number]  Systolic BP [id number] 123456 A999333 Q000111Q [id number] 123456 0000000  Diastolic BP 84 80 80 90 90 88 82  Wt. (Lbs) 204.12 207 200.12 203 203 214 210  BMI 39.86 40.43 39.08 39.65 39.65 41.79 41.34   Foot/eye exam completion dates Latest Ref Rng & Units 10/18/2015 09/15/2014  Eye Exam No Retinopathy - No Retinopathy  Foot exam Order - - -  Foot Form Completion - Done -

## 2016-06-10 NOTE — Assessment & Plan Note (Signed)
Uncontrolled, depo medrol and prednisone dose pack

## 2016-06-21 ENCOUNTER — Telehealth: Payer: Self-pay | Admitting: Family Medicine

## 2016-06-21 ENCOUNTER — Telehealth: Payer: Self-pay

## 2016-06-21 MED ORDER — BENZONATATE 100 MG PO CAPS: 100.0000 mg | ORAL_CAPSULE | Freq: Two times a day (BID) | ORAL | 0 refills | Status: DC | PRN

## 2016-06-21 MED ORDER — PREDNISONE 5 MG (21) PO TBPK: 5.0000 mg | ORAL_TABLET | ORAL | 0 refills | Status: DC

## 2016-06-21 MED ORDER — ERGOCALCIFEROL 1.25 MG (50000 UT) PO CAPS: 50000.0000 [IU] | ORAL_CAPSULE | ORAL | 3 refills | Status: DC

## 2016-06-21 NOTE — Telephone Encounter (Signed)
Patient aware.  Medications refilled.  Patient will keep appt with pulmonology

## 2016-06-21 NOTE — Telephone Encounter (Signed)
Sabrina [name] is calling stating that she is not any better, she still has a dry cough, she is out of prednisone, the Cough Pills never were called in it was cough syrup which she is allergic to. And the vitamin D2 never got called in either please advise?

## 2016-06-21 NOTE — Telephone Encounter (Signed)
NOTED

## 2016-06-21 NOTE — Telephone Encounter (Signed)
pls refill the prednisone dose pack x 1 and also the tessalon perle x 1, remind her very important to keep appt with pulmonary Dr tomorrow, he will be [place]

## 2016-06-21 NOTE — Telephone Encounter (Signed)
Please see previous telephone message.

## 2016-06-21 NOTE — Progress Notes (Signed)
Cardiology Office Note    Date:  06/22/2016   ID:  Sabrina [name], Sabrina [name] 25-Dec-1960, MRN [id number]  PCP:  Sabrina Nakayama, MD  Cardiologist:  Sabrina Him, MD   Chief Complaint  Patient presents with  . Congestive Heart Failure  . Hypertension    History of Present Illness:  Sabrina [name] is a 55 y.o. female with a hx of diastolic CHF, severe pulmonary HTN (PASP 114 mmHg by recent cath), morbid obesity, HTN, HL, GERD, DM2 and normal coronary arteries by cath.  Echocardiogram demonstrated normal LV function, mild diastolic dysfunction, findings consistent with RV pressure and volume overload, trivial MR, no mitral stenosis, mild TR, PASP 91 mmHg, trivial pericardial effusion.  She returns for follow-up.   She denies orthopnea, PND. Edema is improved.  She denies syncope, chest pain, SOB or DOE.  VQ scan was normal with no evidence of PE.  LVEDP at the time of cath was normal at 60mmHg.  There was no MV disease on echo.  She had a negative sleep study prior to weight loss surgery.  It was felt that she may have primary pulmonary HTN and was referred to Dr. Lamonte [name].  Her ANA was positive as well as anticentromere Ab and anti Scl 70 was negative.  She is seeing Dr. Lamonte [name] today for followup.  She also has a history of prolonged QT.  She is doing well today.  She has recently had asthmatic bronchitis and is on steroids and has had a mild cough and SOB.  She denies any dizziness, chest pain, palpitations or syncope.  She has not had any LE edema.      Past Medical History:  Diagnosis Date  . Allergy   . Anemia   . Arthritis   . Asthma   . Chronic bronchitis ([place])   . Diabetes mellitus   . Endometriosis   . Fibroid uterus   . GERD (gastroesophageal reflux disease)   . History of Doppler ultrasound    a. Carotid US 8/08:Estimated stenosis in the right and left internal carotid arteries is 0-50% and 0-50%.  . History of echocardiogram    a. Echo 6/16: EF 60-65%, no RWMA.    . History of hiatal hernia   . History of nuclear stress test    a. Myoview 6/16: There is no resting or stress perfusion defect consistent with no prior infarct and no ischemia. EF 73 %  The patient was hypertensive prior and during the study. Cardiac cath 04/2016 showed normal coronary arteries  . Hx of iron deficiency anemia   . Hypersomnia with sleep apnea   . Hypertension   . 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 Menorrhagia   . Obesity   . Pneumonia   . 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 Secondary hyperthyroidism   . Thyroid disease   . Tinnitus    left ear  . Vertigo   . Vitamin D deficiency     Past Surgical History:  Procedure Laterality Date  . ABDOMINAL HYSTERECTOMY  2004   supracervical hysterectomy  . BREAST SURGERY  2005   reduction  . BREATH TEK H PYLORI [name] 12/24/2014   Procedure: BREATH TEK H PYLORI;  Surgeon: Sabrina Pickerel, MD;  Location: [facility];  Service: General;  Laterality: [name];  . CARDIAC CATHETERIZATION [name] 03/22/2016   Procedure: Right/Left Heart Cath and Coronary Angiography;  Surgeon: Sabrina Sine, MD;  Location: [facility];  Service: Cardiovascular;  Laterality: [name];  .  DILATION AND CURETTAGE OF UTERUS     age 50  . ESOPHAGOGASTRODUODENOSCOPY (EGD) WITH PROPOFOL [name] 10/14/2015   Procedure: ESOPHAGOGASTRODUODENOSCOPY (EGD) WITH PROPOFOL;  Surgeon: Sabrina Essex, MD;  Location: [facility];  Service: Endoscopy;  Laterality: [name];  . LAPAROSCOPIC GASTRIC SLEEVE RESECTION WITH HIATAL HERNIA REPAIR [name] 07/05/2015   Procedure: LAPAROSCOPIC GASTRIC SLEEVE RESECTION WITH HIATAL HERNIA REPAIR, upper endoscopy;  Surgeon: Sabrina Pickerel, MD;  Location: [facility];  Service: General;  Laterality: [name];    Current Medications: Outpatient Medications Prior to Visit  Medication Sig Dispense Refill  . aspirin EC 81 MG tablet Take 1 tablet (81 mg total) by mouth daily.    . benzonatate (TESSALON PERLES) 100 MG capsule Take 1 capsule (100 mg total) by mouth 2 (two) times  daily as needed for cough. 20 capsule 0  . ergocalciferol (VITAMIN D2) 50000 units capsule Take 1 capsule (50,000 Units total) by mouth once a week. 4 capsule 3  . furosemide (LASIX) 40 MG tablet Take 1 tablet (40 mg total) by mouth daily. 30 tablet 11  . montelukast (SINGULAIR) 10 MG tablet Take 1 tablet (10 mg total) by mouth at bedtime. 30 tablet 3  . Multiple Vitamin (MULTIVITAMIN WITH MINERALS) TABS tablet Take 1 tablet by mouth daily.    . nitroGLYCERIN (NITROSTAT) 0.4 MG SL tablet Place 0.4 mg under the tongue every 5 (five) minutes as needed for chest pain (3 DOSES MAX).    [name] ondansetron (ZOFRAN ODT) 4 MG disintegrating tablet One tablet daily, as needed , for nusea 30 tablet 2  . pantoprazole (PROTONIX) 40 MG tablet Take 40 mg by mouth daily as needed (ACID REFLUX).    [name] predniSONE (STERAPRED UNI-PAK 21 TAB) 5 MG (21) TBPK tablet Take 1 tablet (5 mg total) by mouth as directed. Use as directed 21 tablet 0  . promethazine-dextromethorphan (PROMETHAZINE-DM) 6.25-15 MG/5ML syrup One teaspoon at bedtime, as needed, for excessive cough 240 mL 0  . triamcinolone ointment (KENALOG) 0.1 % APPLY TO ITCHING SPOTS ON THE BODY DAILY TO TWICE DAILY AS NEEDED. NOT TO FACE. 454 g 0  . triamterene-hydrochlorothiazide (MAXZIDE-25) 37.5-25 MG tablet Take 1.5 tablets by mouth daily. ON HOLD TIL FINISHED LASIX     No facility-administered medications prior to visit.      Allergies:   Peanut-containing drug products; Potassium iodide; Ace inhibitors; Codeine; Doxycycline; Amlodipine; Latex; and Penicillins   Social History   Social History  . Marital status: Married    Spouse name: [name]  . Number of children: [name]  . Years of education: 53   Occupational History  . 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 Smoking status: Never Smoker  . Smokeless tobacco: Never Used  . Alcohol use No  . Drug use: No  . Sexual activity: Yes    Partners: Male    Birth  control/ protection: Surgical     Comment: hysterectomy   Other Topics Concern  . None   Social History Narrative   Regular exercise-no   Caffeine Use-no           Family History:  The patient's family history includes Arthritis in her father, mother, and sister; Bleeding Disorder in her sister; Breast cancer in her sister; CAD in her mother; Cancer in her father; Colon cancer in her maternal grandfather; Depression in her father, mother, and sister; Diabetes in her father and mother; Hypertension in her father, mother, and sister; Stroke in her father and maternal aunt;  Thyroid disease in her sister.   ROS:   Please see the history of present illness.    ROS All other systems reviewed and are negative.  No flowsheet data found.     PHYSICAL EXAM:   VS:  BP 140/86   Pulse 72   Ht 5' (1.524 m)   Wt 207 lb 1.9 oz (93.9 kg)   LMP 10/31/2002   BMI 40.45 kg/m    GEN: Well nourished, well developed, in no acute distress  HEENT: normal  Neck: no JVD, carotid bruits, or masses Cardiac: RRR; no murmurs, rubs, or gallops,no edema.  Intact distal pulses bilaterally.  Respiratory:  clear to auscultation bilaterally, normal work of breathing GI: soft, nontender, nondistended, + BS MS: no deformity or atrophy  Skin: warm and dry, no rash Neuro:  Alert and Oriented x 3, Strength and sensation are intact Psych: euthymic mood, full affect  Wt Readings from Last 3 Encounters:  06/22/16 207 lb 1.9 oz (93.9 kg)  06/07/16 204 lb 1.9 oz (92.6 kg)  05/25/16 207 lb (93.9 kg)      Studies/Labs Reviewed:   EKG:  EKG is ordered today.  The ekg ordered today demonstrates NSR at 61bpm  With inferior and anterolateral ST abnormality and prolonged QT at 520msec  Recent Labs: 08/22/2015: ALT 10 03/16/2016: Brain Natriuretic Peptide 409.5 03/20/2016: Hemoglobin 13.6; Magnesium 1.8; Platelets 344 05/18/2016: BUN 20; Creat 1.20; Potassium 3.8; Sodium 141   Lipid Panel    Component Value Date/Time    CHOL 111 (L) 03/10/2016 1038   TRIG 83 03/10/2016 1038   HDL 32 (L) 03/10/2016 1038   CHOLHDL 3.5 03/10/2016 1038   VLDL 17 03/10/2016 1038   LDLCALC 62 03/10/2016 1038    Additional studies/ records that were reviewed today include:  none    ASSESSMENT:    1. Chronic diastolic heart failure ([place])   2. Essential hypertension   3. Pulmonary HTN   4. Prolonged QT syndrome      PLAN:  In order of problems listed above:  1. Chronic diastolic CHF - She appears euvolemic on exam.  Her weight is up but she is currently on steroids for asthmatic bronchitis.  She will continue on diuretics.  2. HTN - BP controlled . 3. 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 Lamonte [name] today.  Will await further recs on if he wants to repeat RHC with vasodilator therapy.  Continue diuretics.  No evidence of PE on VQ scan, no MV disease, sleep study pending.  Likely related to rheumatologic disease.  Sleep study is pending 4. Prolonged QTc - this is still prolonged at 595msec.  I have given her a list a drugs that prolong the QT that she needs to avoid.  She was recently placed on promethazine PRN for cough which I told her she needs to stop.  I am going to refer her to EP for further workup of this.     Medication Adjustments/Labs and Tests Ordered: Current medicines are reviewed at length with the patient today.  Concerns regarding medicines are outlined above.  Medication changes, Labs and Tests ordered today are listed in the Patient Instructions below.  There are no Patient Instructions on file for this visit.   Signed, Sabrina Him, MD  06/22/2016 9:54 AM    [facility] [address], [place], [place]  [address] Phone: [phone]; Fax: [phone]

## 2016-06-22 ENCOUNTER — Other Ambulatory Visit (INDEPENDENT_AMBULATORY_CARE_PROVIDER_SITE_OTHER): Payer: 59

## 2016-06-22 ENCOUNTER — Encounter: Payer: Self-pay | Admitting: Cardiology

## 2016-06-22 ENCOUNTER — Ambulatory Visit (INDEPENDENT_AMBULATORY_CARE_PROVIDER_SITE_OTHER): Payer: 59 | Admitting: Cardiology

## 2016-06-22 ENCOUNTER — Encounter: Payer: Self-pay | Admitting: Emergency Medicine

## 2016-06-22 ENCOUNTER — Ambulatory Visit (INDEPENDENT_AMBULATORY_CARE_PROVIDER_SITE_OTHER): Payer: 59 | Admitting: Emergency Medicine

## 2016-06-22 VITALS — BP 140/86 | HR 72 | Ht 60.0 in | Wt 207.1 lb

## 2016-06-22 DIAGNOSIS — I4581 Long QT syndrome: Secondary | ICD-10-CM | POA: Diagnosis not present

## 2016-06-22 DIAGNOSIS — M359 Systemic involvement of connective tissue, unspecified: Secondary | ICD-10-CM | POA: Insufficient documentation

## 2016-06-22 DIAGNOSIS — I5032 Chronic diastolic (congestive) heart failure: Secondary | ICD-10-CM | POA: Diagnosis not present

## 2016-06-22 DIAGNOSIS — I272 Pulmonary hypertension, unspecified: Secondary | ICD-10-CM

## 2016-06-22 DIAGNOSIS — I10 Essential (primary) hypertension: Secondary | ICD-10-CM

## 2016-06-22 LAB — HEPATIC FUNCTION PANEL
ALK PHOS: 63 U/L (ref 39–117)
ALT: 30 U/L (ref 0–35)
AST: 22 U/L (ref 0–37)
Albumin: 3.8 g/dL (ref 3.5–5.2)
BILIRUBIN DIRECT: 0.1 mg/dL (ref 0.0–0.3)
BILIRUBIN TOTAL: 0.5 mg/dL (ref 0.2–1.2)
Total Protein: 7.7 g/dL (ref 6.0–8.3)

## 2016-06-22 NOTE — Assessment & Plan Note (Addendum)
I suspect that this is Group 1 disease based on her evidence for connective tissue disease, her history of diet pill use. There may also be contributions of sleep apnea and left-sided heart disease w diastolic dysfunction.   Ventilation /  perfusion scan is low prob. Sleep apnea still needs to be evaluated. PASP profoundly elevated as discussed above.   She only has minor dyspnea with significant exertion which I believe places her in NYHA Class II. She does not desaturate. We will start PO meds > would like to start with Adcirca + Letaris. She will have LFT today and we will initiate the paperwork. She will need a repeat echocardiogram several months after initiation. She needs a baseline 6 minute walk.

## 2016-06-22 NOTE — Patient Instructions (Signed)
Medication Instructions:  Your physician recommends that you continue on your current medications as directed. Please refer to the Current Medication list given to you today.   Labwork: None  Testing/Procedures: None  Follow-Up: You have been referred to Dr. Curt Bears for evaluation of prolonged QT.  Your physician wants you to follow-up in: 6 months with Dr. Radford Pax. You will receive a reminder letter in the mail two months in advance. If you don't receive a letter, please call our office to schedule the follow-up appointment.   Any Other Special Instructions Will Be Listed Below (If Applicable).     If you need a refill on your cardiac medications before your next appointment, please call your pharmacy.

## 2016-06-22 NOTE — Progress Notes (Signed)
Subjective:    Patient ID: Sabrina [name], female    DOB: 09-06-1960, 55 y.o.   MRN: [id number]  HPI [name] is a 55 year old never smoker with a history of diastolic CHF, systemic hypertension, diabetes, obesity with ?? obstructive sleep apnea. She had a sleep study at [facility] before her gastric sgy. With a history of possible Lupus followed at [facility] and locally by Dr Tonia Brooms. She was seen by Rheum and it sounds like she was serology negative. Underwent gastric sleeve for wt loss (2016) by Dr Redmond Pulling, well tolerated. She took Phen-Fen for about 6 months, about 2 years. Former MTX use  She has been under evaluation for her CHF and is followed at [facility] heart [facility]. She has been on lasix for the last month or so, feels that her breathing is better.  She underwent left and right cardiac catheterization 03/22/16 as detailed below, shows significant elevation PA pressures. No MV or TV disease noted.   ROV 06/22/16 -- This follow-up visit for evaluation of severe pulmonary hypertension. She has a history as outlined above that includes diastolic CHF, suspected obstructive sleep apnea, possible connective tissue disease, and diet pill use. We have performed testing since last visit. She has had a low probability ventilation/perfusion scan 06/04/16, and normal chest x-ray. She has a positive ANA from 05/25/16 with a titer of 1:1280, centromeric pattern. Rheumatoid factor elevated at 36. Home sleep study is pending. Walking oximetry last time was normal on RA.   She has been having dry cough, may be due to increased allergy sx. She does not feel exertional SOB, is able to exert. She takes stairs slower but is able to climb. She has seen Dr Lavonna Monarch and was just started on hydroxychloriquine this week, for skin symptoms.     Echo 03/22/16 EF 50-55%, normal wall motion, grade 1 diastolic dysfunction, diastolic and systolic flattening of ventricular septum consistent with RV pressure and volume  overload, trivial MR, reduced RVSF, mild TR, PASP 91 mmHg, trivial pericardial effusion; severe pulmonary hypertension  L/RHC 03/22/16  The left ventricular systolic function is normal.  The left ventricular ejection fraction is 55-65% by visual estimate.  Hemodynamic findings consistent with severe pulmonary hypertension.  PASP 114 mmHg Severe right ventricular systolic pressure and pulmonary hypertension with systolic pressures greater than 100 mmHg. Significant pulmonary capillary/LV gradient, without previous evidence of mitral stenosis on echocardiography. Normal coronary arteries. Normal LV systolic function with left ventricular hypertrophy. RECOMMENDATION: The patient will undergo 2-D echo Doppler study later today prior to being discharged. The etiology of her severe pulmonary hypertension is unknown and consider possible primary pulmonary hypertension. Echo Doppler study will be helpful to further assess her mitral valve and potential for RV volume/pressure overload  Review of Systems  Constitutional: Negative.  Negative for fever and unexpected weight change.  HENT: Positive for sinus pressure and sneezing. Negative for congestion, dental problem, ear pain, nosebleeds, postnasal drip, rhinorrhea, sore throat and trouble swallowing.   Eyes: Positive for itching. Negative for redness.  Respiratory: Positive for cough, shortness of breath and wheezing. Negative for chest tightness.   Cardiovascular: Negative.  Negative for palpitations and leg swelling.  Gastrointestinal: Negative.  Negative for nausea and vomiting.  Endocrine: Negative.   Genitourinary: Negative.  Negative for dysuria.  Musculoskeletal: Negative.  Negative for joint swelling.  Skin: Negative.  Negative for rash.  Allergic/Immunologic: Positive for environmental allergies.  Neurological: Negative.  Negative for headaches.  Hematological: Bruises/bleeds easily.  Psychiatric/Behavioral: Negative.  Negative  for dysphoric mood. The patient is not nervous/anxious.     Past Medical History:  Diagnosis Date  . Allergy   . Anemia   . Arthritis   . Asthma   . Chronic bronchitis ([place])   . Diabetes mellitus   . Endometriosis   . Fibroid uterus   . GERD (gastroesophageal reflux disease)   . History of Doppler ultrasound    a. Carotid US 8/08:Estimated stenosis in the right and left internal carotid arteries is 0-50% and 0-50%.  . History of echocardiogram    a. Echo 6/16: EF 60-65%, no RWMA.  . History of hiatal hernia   . History of nuclear stress test    a. Myoview 6/16: There is no resting or stress perfusion defect consistent with no prior infarct and no ischemia. EF 73 %  The patient was hypertensive prior and during the study. Cardiac cath 04/2016 showed normal coronary arteries  . Hx of iron deficiency anemia   . Hypersomnia with sleep apnea   . Hypertension   . 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 Menorrhagia   . Obesity   . Pneumonia   . 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 Secondary hyperthyroidism   . Thyroid disease   . Tinnitus    left ear  . Vertigo   . Vitamin D deficiency      Family History  Problem Relation Age of Onset  . Hypertension Mother   . Diabetes Mother   . Arthritis Mother   . Depression Mother   . CAD Mother     s/p PCI  . Diabetes Father   . Stroke Father   . Arthritis Father   . Depression Father   . Hypertension Father   . Cancer Father     prostate  . Hypertension Sister     x 2  . Breast cancer Sister   . Arthritis Sister   . Bleeding Disorder Sister   . Depression Sister   . Thyroid disease Sister   . Colon cancer Maternal Grandfather   . Stroke Maternal Aunt   . Heart attack Neg Hx     Works as a Engineer, production, desk job.   Social History   Social History  . Marital status: Married    Spouse name: [name]  . Number of children: [name]  . Years of education: 19   Occupational History  . Housing Specialist [employer]     Social History Main Topics  . Smoking status: Never Smoker  . Smokeless tobacco: Never Used  . Alcohol use No  . Drug use: No  . Sexual activity: Yes    Partners: Male    Birth control/ protection: Surgical     Comment: hysterectomy   Other Topics Concern  . Not on file   Social History Narrative   Regular exercise-no   Caffeine Use-no           Allergies  Allergen Reactions  . Peanut-Containing Drug Products Nausea And Vomiting  . Potassium Iodide Other (See Comments)    facial swelling, also eyes, and ears  . Ace Inhibitors Cough  . Codeine Nausea And Vomiting  . Doxycycline Hives  . Amlodipine Rash  . Latex Hives and Rash  . Penicillins Hives, Itching and Rash    .Marland KitchenHas patient had a PCN reaction causing immediate rash, facial/tongue/throat swelling, SOB or lightheadedness with hypotension: No Has patient had a PCN reaction causing severe rash involving mucus membranes or skin necrosis: No Has  patient had a PCN reaction that required hospitalization No Has patient had a PCN reaction occurring within the last 10 years: YES (3 years ago)  If all of the above answers are "NO", then may proceed with Cephalosporin use.      Outpatient Medications Prior to Visit  Medication Sig Dispense Refill  . aspirin EC 81 MG tablet Take 1 tablet (81 mg total) by mouth daily.    . benzonatate (TESSALON PERLES) 100 MG capsule Take 1 capsule (100 mg total) by mouth 2 (two) times daily as needed for cough. 20 capsule 0  . ergocalciferol (VITAMIN D2) 50000 units capsule Take 1 capsule (50,000 Units total) by mouth once a week. 4 capsule 3  . furosemide (LASIX) 40 MG tablet Take 1 tablet (40 mg total) by mouth daily. 30 tablet 11  . montelukast (SINGULAIR) 10 MG tablet Take 1 tablet (10 mg total) by mouth at bedtime. 30 tablet 3  . Multiple Vitamin (MULTIVITAMIN WITH MINERALS) TABS tablet Take 1 tablet by mouth daily.    . nitroGLYCERIN (NITROSTAT) 0.4 MG SL tablet Place 0.4 mg under the  tongue every 5 (five) minutes as needed for chest pain (3 DOSES MAX).    [name] ondansetron (ZOFRAN ODT) 4 MG disintegrating tablet One tablet daily, as needed , for nusea 30 tablet 2  . pantoprazole (PROTONIX) 40 MG tablet Take 40 mg by mouth daily as needed (ACID REFLUX).    [name] predniSONE (STERAPRED UNI-PAK 21 TAB) 5 MG (21) TBPK tablet Take 1 tablet (5 mg total) by mouth as directed. Use as directed 21 tablet 0  . promethazine-dextromethorphan (PROMETHAZINE-DM) 6.25-15 MG/5ML syrup One teaspoon at bedtime, as needed, for excessive cough 240 mL 0  . triamcinolone ointment (KENALOG) 0.1 % APPLY TO ITCHING SPOTS ON THE BODY DAILY TO TWICE DAILY AS NEEDED. NOT TO FACE. 454 g 0  . triamterene-hydrochlorothiazide (MAXZIDE-25) 37.5-25 MG tablet Take 1.5 tablets by mouth daily. ON HOLD TIL FINISHED LASIX     No facility-administered medications prior to visit.         Objective:   Physical Exam Vitals:   06/22/16 1140 06/22/16 1141  BP:  (!) 140/100  Pulse:  (!) 56  SpO2:  96%  Weight: 208 lb (94.3 kg)   Height: 5' (1.524 m)    Gen: Pleasant, overwt woman, in no distress,  normal affect  ENT: No lesions,  mouth clear,  oropharynx clear, no postnasal drip  Neck: No JVD, no TMG, no carotid bruits  Lungs: No use of accessory muscles, clear without rales or rhonchi  Cardiovascular: RRR, heart sounds normal, no murmur or gallops, no peripheral edema  Musculoskeletal: No deformities, no cyanosis or clubbing  Neuro: alert, non focal  Skin: Warm, no lesions or rashes      Assessment & Plan:  Pulmonary HTN I suspect that this is Group 1 disease based on her evidence for connective tissue disease, her history of diet pill use. There may also be contributions of sleep apnea and left-sided heart disease w diastolic dysfunction. She only has minor dyspnea with significant exertion which I believe places her in NYHA Class II. She does not desaturate. We will start PO meds > would like to start with  Adcirca + Letaris. She will have LFT today and we will initiate the paperwork. She will need a repeat echocardiogram several months after initiation. She needs a baseline 6 minute walk.   Connective tissue disease, undifferentiated ([place]) Profoundly elevated ANA, slightly elevated Rhheumatoid factor. She  has been treated in the past for possible SLE. Also scleroderma has been discussed although our serologies show negative Scl-70. She starting hydroxychloraquine. I will get notes from Dr Sudie Grumbling  Baltazar Apo, MD, PhD 06/22/2016, 12:16 PM [facility] [phone] or if no answer [phone]

## 2016-06-22 NOTE — Assessment & Plan Note (Signed)
Profoundly elevated ANA, slightly elevated Rhheumatoid factor. She has been treated in the past for possible SLE. Also scleroderma has been discussed although our serologies show negative Scl-70. She starting hydroxychloraquine. I will get notes from Dr Sudie Grumbling

## 2016-06-22 NOTE — Patient Instructions (Addendum)
Will work on obtaining 2 new medications to address pulmonary hypertension. These will be Adcirca and letaris.  You will have blood work today.  Get your home sleep study as planned.  Start hydroxychloraquine as planned.  Follow with Dr Lamonte Sakai in 4 - 6 weeks or sooner if you have any problems.

## 2016-06-24 DIAGNOSIS — G4733 Obstructive sleep apnea (adult) (pediatric): Secondary | ICD-10-CM | POA: Diagnosis not present

## 2016-06-25 ENCOUNTER — Telehealth: Payer: Self-pay | Admitting: Emergency Medicine

## 2016-06-25 NOTE — Telephone Encounter (Signed)
Notes Recorded by Collene Gobble, MD on 06/22/2016 at 2:39 PM EST Please notify the patient that her liver tests are normal. We will proceed with arranging for her to start medications for [facility] ------------------------------------ Spoke with pt. She is aware of results. Nothing further was needed.

## 2016-06-26 DIAGNOSIS — G4733 Obstructive sleep apnea (adult) (pediatric): Secondary | ICD-10-CM | POA: Diagnosis not present

## 2016-06-28 ENCOUNTER — Telehealth: Payer: Self-pay | Admitting: Emergency Medicine

## 2016-06-28 ENCOUNTER — Other Ambulatory Visit: Payer: Self-pay | Admitting: *Deleted

## 2016-06-28 DIAGNOSIS — I272 Pulmonary hypertension, unspecified: Secondary | ICD-10-CM

## 2016-06-28 DIAGNOSIS — I749 Embolism and thrombosis of unspecified artery: Secondary | ICD-10-CM

## 2016-06-28 NOTE — Telephone Encounter (Signed)
Ria Comment has filled out the PA forms for both meds.  Will have RB sign these and will fax them back in on Monday. Will forward to lindsay.

## 2016-06-29 ENCOUNTER — Other Ambulatory Visit (HOSPITAL_COMMUNITY): Payer: Self-pay | Admitting: *Deleted

## 2016-06-29 ENCOUNTER — Other Ambulatory Visit: Payer: Self-pay | Admitting: Family Medicine

## 2016-06-29 DIAGNOSIS — Z1231 Encounter for screening mammogram for malignant neoplasm of breast: Secondary | ICD-10-CM

## 2016-07-02 NOTE — Telephone Encounter (Signed)
PA forms have been completed and placed in RB's sign folder. Sign folder has been given to [place]. Will await the return of this folder to me.

## 2016-07-02 NOTE — Telephone Encounter (Signed)
PA forms have been signed and returned to me. Forms have been faxed back. Will await PA decision.  **Forms have been placed in the yellow folder labeled "RB's pending PA's" in RB's look at [employer].**

## 2016-07-04 ENCOUNTER — Other Ambulatory Visit: Payer: Self-pay

## 2016-07-04 MED ORDER — BENZONATATE 100 MG PO CAPS: 100.0000 mg | ORAL_CAPSULE | Freq: Two times a day (BID) | ORAL | 0 refills | Status: DC | PRN

## 2016-07-06 ENCOUNTER — Observation Stay (HOSPITAL_COMMUNITY): Payer: 59

## 2016-07-06 ENCOUNTER — Emergency Department (HOSPITAL_COMMUNITY): Payer: 59

## 2016-07-06 ENCOUNTER — Observation Stay (HOSPITAL_COMMUNITY)
Admission: EM | Admit: 2016-07-06 | Discharge: 2016-07-09 | Disposition: A | Discharge status: Home / Self Care | Payer: 59 | Attending: Pulmonary Disease | Admitting: Pulmonary Disease

## 2016-07-06 ENCOUNTER — Encounter (HOSPITAL_COMMUNITY): Payer: Self-pay | Admitting: Emergency Medicine

## 2016-07-06 DIAGNOSIS — E119 Type 2 diabetes mellitus without complications: Secondary | ICD-10-CM | POA: Insufficient documentation

## 2016-07-06 DIAGNOSIS — R1011 Right upper quadrant pain: Secondary | ICD-10-CM | POA: Diagnosis not present

## 2016-07-06 DIAGNOSIS — K219 Gastro-esophageal reflux disease without esophagitis: Secondary | ICD-10-CM | POA: Diagnosis not present

## 2016-07-06 DIAGNOSIS — M349 Systemic sclerosis, unspecified: Secondary | ICD-10-CM | POA: Insufficient documentation

## 2016-07-06 DIAGNOSIS — I272 Pulmonary hypertension, unspecified: Secondary | ICD-10-CM | POA: Diagnosis not present

## 2016-07-06 DIAGNOSIS — I5030 Unspecified diastolic (congestive) heart failure: Secondary | ICD-10-CM | POA: Diagnosis not present

## 2016-07-06 DIAGNOSIS — I110 Hypertensive heart disease with heart failure: Secondary | ICD-10-CM | POA: Insufficient documentation

## 2016-07-06 DIAGNOSIS — D509 Iron deficiency anemia, unspecified: Secondary | ICD-10-CM | POA: Insufficient documentation

## 2016-07-06 DIAGNOSIS — Z818 Family history of other mental and behavioral disorders: Secondary | ICD-10-CM | POA: Insufficient documentation

## 2016-07-06 DIAGNOSIS — R091 Pleurisy: Secondary | ICD-10-CM | POA: Insufficient documentation

## 2016-07-06 DIAGNOSIS — J9601 Acute respiratory failure with hypoxia: Secondary | ICD-10-CM | POA: Diagnosis not present

## 2016-07-06 DIAGNOSIS — R0600 Dyspnea, unspecified: Secondary | ICD-10-CM

## 2016-07-06 DIAGNOSIS — Z885 Allergy status to narcotic agent status: Secondary | ICD-10-CM | POA: Insufficient documentation

## 2016-07-06 DIAGNOSIS — E876 Hypokalemia: Secondary | ICD-10-CM | POA: Insufficient documentation

## 2016-07-06 DIAGNOSIS — R05 Cough: Secondary | ICD-10-CM

## 2016-07-06 DIAGNOSIS — Z6841 Body Mass Index (BMI) 40.0 and over, adult: Secondary | ICD-10-CM | POA: Diagnosis not present

## 2016-07-06 DIAGNOSIS — M329 Systemic lupus erythematosus, unspecified: Secondary | ICD-10-CM | POA: Insufficient documentation

## 2016-07-06 DIAGNOSIS — E559 Vitamin D deficiency, unspecified: Secondary | ICD-10-CM | POA: Insufficient documentation

## 2016-07-06 DIAGNOSIS — Z91040 Latex allergy status: Secondary | ICD-10-CM | POA: Insufficient documentation

## 2016-07-06 DIAGNOSIS — H9312 Tinnitus, left ear: Secondary | ICD-10-CM | POA: Insufficient documentation

## 2016-07-06 DIAGNOSIS — Z8249 Family history of ischemic heart disease and other diseases of the circulatory system: Secondary | ICD-10-CM | POA: Insufficient documentation

## 2016-07-06 DIAGNOSIS — M359 Systemic involvement of connective tissue, unspecified: Secondary | ICD-10-CM | POA: Diagnosis not present

## 2016-07-06 DIAGNOSIS — E7800 Pure hypercholesterolemia, unspecified: Secondary | ICD-10-CM | POA: Diagnosis not present

## 2016-07-06 DIAGNOSIS — N2581 Secondary hyperparathyroidism of renal origin: Secondary | ICD-10-CM | POA: Insufficient documentation

## 2016-07-06 DIAGNOSIS — Z888 Allergy status to other drugs, medicaments and biological substances status: Secondary | ICD-10-CM | POA: Insufficient documentation

## 2016-07-06 DIAGNOSIS — Z7982 Long term (current) use of aspirin: Secondary | ICD-10-CM | POA: Insufficient documentation

## 2016-07-06 DIAGNOSIS — R071 Chest pain on breathing: Secondary | ICD-10-CM

## 2016-07-06 DIAGNOSIS — E0590 Thyrotoxicosis, unspecified without thyrotoxic crisis or storm: Secondary | ICD-10-CM | POA: Insufficient documentation

## 2016-07-06 DIAGNOSIS — Z823 Family history of stroke: Secondary | ICD-10-CM | POA: Insufficient documentation

## 2016-07-06 DIAGNOSIS — K449 Diaphragmatic hernia without obstruction or gangrene: Secondary | ICD-10-CM | POA: Insufficient documentation

## 2016-07-06 DIAGNOSIS — Z833 Family history of diabetes mellitus: Secondary | ICD-10-CM | POA: Insufficient documentation

## 2016-07-06 DIAGNOSIS — E8881 Metabolic syndrome: Secondary | ICD-10-CM | POA: Insufficient documentation

## 2016-07-06 DIAGNOSIS — R109 Unspecified abdominal pain: Secondary | ICD-10-CM | POA: Diagnosis present

## 2016-07-06 DIAGNOSIS — R42 Dizziness and giddiness: Secondary | ICD-10-CM | POA: Insufficient documentation

## 2016-07-06 DIAGNOSIS — Z8261 Family history of arthritis: Secondary | ICD-10-CM | POA: Insufficient documentation

## 2016-07-06 DIAGNOSIS — J449 Chronic obstructive pulmonary disease, unspecified: Secondary | ICD-10-CM | POA: Diagnosis not present

## 2016-07-06 DIAGNOSIS — L409 Psoriasis, unspecified: Secondary | ICD-10-CM | POA: Insufficient documentation

## 2016-07-06 DIAGNOSIS — Z881 Allergy status to other antibiotic agents status: Secondary | ICD-10-CM | POA: Insufficient documentation

## 2016-07-06 DIAGNOSIS — G4733 Obstructive sleep apnea (adult) (pediatric): Secondary | ICD-10-CM | POA: Insufficient documentation

## 2016-07-06 DIAGNOSIS — R0902 Hypoxemia: Secondary | ICD-10-CM

## 2016-07-06 DIAGNOSIS — Z79899 Other long term (current) drug therapy: Secondary | ICD-10-CM | POA: Insufficient documentation

## 2016-07-06 DIAGNOSIS — Z8042 Family history of malignant neoplasm of prostate: Secondary | ICD-10-CM | POA: Insufficient documentation

## 2016-07-06 DIAGNOSIS — Z90710 Acquired absence of both cervix and uterus: Secondary | ICD-10-CM | POA: Insufficient documentation

## 2016-07-06 DIAGNOSIS — M793 Panniculitis, unspecified: Secondary | ICD-10-CM | POA: Insufficient documentation

## 2016-07-06 DIAGNOSIS — Z9884 Bariatric surgery status: Secondary | ICD-10-CM | POA: Insufficient documentation

## 2016-07-06 DIAGNOSIS — Z91010 Allergy to peanuts: Secondary | ICD-10-CM | POA: Insufficient documentation

## 2016-07-06 DIAGNOSIS — Z880 Allergy status to penicillin: Secondary | ICD-10-CM | POA: Insufficient documentation

## 2016-07-06 LAB — BASIC METABOLIC PANEL
ANION GAP: 10 (ref 5–15)
BUN: 15 mg/dL (ref 6–20)
CALCIUM: 6.4 mg/dL — AB (ref 8.9–10.3)
CO2: 33 mmol/L — ABNORMAL HIGH (ref 22–32)
Chloride: 97 mmol/L — ABNORMAL LOW (ref 101–111)
Creatinine, Ser: 1.26 mg/dL — ABNORMAL HIGH (ref 0.44–1.00)
GFR calc Af Amer: 56 mL/min — ABNORMAL LOW (ref >60)
GFR, EST NON AFRICAN AMERICAN: 48 mL/min — AB (ref >60)
Glucose, Bld: 128 mg/dL — ABNORMAL HIGH (ref 65–99)
POTASSIUM: 2.9 mmol/L — AB (ref 3.5–5.1)
SODIUM: 140 mmol/L (ref 135–145)

## 2016-07-06 LAB — CBC
HEMATOCRIT: 38.8 % (ref 36.0–46.0)
Hemoglobin: 12.5 g/dL (ref 12.0–15.0)
MCH: 27.8 pg (ref 26.0–34.0)
MCHC: 32.2 g/dL (ref 30.0–36.0)
MCV: 86.2 fL (ref 78.0–100.0)
Platelets: 303 10*3/uL (ref 150–400)
RBC: 4.5 MIL/uL (ref 3.87–5.11)
RDW: 16.2 % — AB (ref 11.5–15.5)
WBC: 16.7 10*3/uL — AB (ref 4.0–10.5)

## 2016-07-06 LAB — TROPONIN I
Troponin I: <0.03 ng/mL (ref <0.03)
Troponin I: <0.03 ng/mL (ref <0.03)

## 2016-07-06 LAB — C-REACTIVE PROTEIN: CRP: 12.3 mg/dL — ABNORMAL HIGH (ref <1.0)

## 2016-07-06 LAB — HEPATIC FUNCTION PANEL
ALT: 21 U/L (ref 14–54)
AST: 26 U/L (ref 15–41)
Albumin: 3.3 g/dL — ABNORMAL LOW (ref 3.5–5.0)
Alkaline Phosphatase: 59 U/L (ref 38–126)
BILIRUBIN INDIRECT: 0.7 mg/dL (ref 0.3–0.9)
Bilirubin, Direct: 0.2 mg/dL (ref 0.1–0.5)
Total Bilirubin: 0.9 mg/dL (ref 0.3–1.2)
Total Protein: 7.4 g/dL (ref 6.5–8.1)

## 2016-07-06 LAB — PROCALCITONIN: PROCALCITONIN: 0.24 ng/mL

## 2016-07-06 LAB — GLUCOSE, CAPILLARY
Glucose-Capillary: 98 mg/dL (ref 65–99)
Glucose-Capillary: 103 mg/dL — ABNORMAL HIGH (ref 65–99)

## 2016-07-06 LAB — SEDIMENTATION RATE: Sed Rate: 46 mm/hr — ABNORMAL HIGH (ref 0–22)

## 2016-07-06 LAB — BRAIN NATRIURETIC PEPTIDE: B Natriuretic Peptide: 740.1 pg/mL — ABNORMAL HIGH (ref 0.0–100.0)

## 2016-07-06 MED ORDER — BENZONATATE 100 MG PO CAPS
100.0000 mg | ORAL_CAPSULE | Freq: Two times a day (BID) | ORAL | Status: DC | PRN
  Administered 2016-07-06 – 2016-07-07 (×2): 100 mg via ORAL
  Filled 2016-07-06 (×2): qty 1

## 2016-07-06 MED ORDER — FUROSEMIDE 10 MG/ML IJ SOLN
20.0000 mg | Freq: Once | INTRAMUSCULAR | Status: AC
  Administered 2016-07-06: 20 mg via INTRAVENOUS
  Filled 2016-07-06: qty 2

## 2016-07-06 MED ORDER — HYDROXYCHLOROQUINE SULFATE 200 MG PO TABS
200.0000 mg | ORAL_TABLET | Freq: Two times a day (BID) | ORAL | Status: DC
  Administered 2016-07-06 – 2016-07-09 (×7): 200 mg via ORAL
  Filled 2016-07-06 (×7): qty 1

## 2016-07-06 MED ORDER — SODIUM CHLORIDE 0.9 % IV SOLN: 250.0000 mL | INTRAVENOUS | Status: DC | PRN

## 2016-07-06 MED ORDER — VITAMIN D (ERGOCALCIFEROL) 1.25 MG (50000 UNIT) PO CAPS: 50000.0000 [IU] | ORAL_CAPSULE | ORAL | Status: DC

## 2016-07-06 MED ORDER — KETOROLAC TROMETHAMINE 15 MG/ML IJ SOLN
15.0000 mg | Freq: Four times a day (QID) | INTRAMUSCULAR | Status: DC | PRN
  Administered 2016-07-06 – 2016-07-08 (×3): 15 mg via INTRAVENOUS
  Filled 2016-07-06 (×3): qty 1

## 2016-07-06 MED ORDER — ASPIRIN EC 81 MG PO TBEC
81.0000 mg | DELAYED_RELEASE_TABLET | Freq: Every day | ORAL | Status: DC
Start: 2016-07-06 — End: 2016-07-09
  Administered 2016-07-06 – 2016-07-09 (×4): 81 mg via ORAL
  Filled 2016-07-06 (×4): qty 1

## 2016-07-06 MED ORDER — MONTELUKAST SODIUM 10 MG PO TABS
10.0000 mg | ORAL_TABLET | Freq: Every day | ORAL | Status: DC
  Administered 2016-07-06 – 2016-07-08 (×3): 10 mg via ORAL
  Filled 2016-07-06 (×3): qty 1

## 2016-07-06 MED ORDER — ENOXAPARIN SODIUM 40 MG/0.4ML ~~LOC~~ SOLN
40.0000 mg | Freq: Every day | SUBCUTANEOUS | Status: DC
  Administered 2016-07-06 – 2016-07-09 (×4): 40 mg via SUBCUTANEOUS
  Filled 2016-07-06 (×4): qty 0.4

## 2016-07-06 MED ORDER — POTASSIUM CHLORIDE CRYS ER 20 MEQ PO TBCR
40.0000 meq | EXTENDED_RELEASE_TABLET | Freq: Once | ORAL | Status: AC
  Administered 2016-07-06: 40 meq via ORAL
  Filled 2016-07-06: qty 2

## 2016-07-06 MED ORDER — ADULT MULTIVITAMIN W/MINERALS CH
1.0000 | ORAL_TABLET | Freq: Every day | ORAL | Status: DC
  Administered 2016-07-06 – 2016-07-09 (×4): 1 via ORAL
  Filled 2016-07-06 (×4): qty 1

## 2016-07-06 MED ORDER — ACETAMINOPHEN 325 MG PO TABS
650.0000 mg | ORAL_TABLET | Freq: Four times a day (QID) | ORAL | Status: DC | PRN
  Administered 2016-07-07: 650 mg via ORAL
  Filled 2016-07-06 (×2): qty 2

## 2016-07-06 MED ORDER — SODIUM CHLORIDE 0.9 % IV SOLN
1.0000 g | Freq: Once | INTRAVENOUS | Status: AC
  Administered 2016-07-06: 1 g via INTRAVENOUS
  Filled 2016-07-06: qty 10

## 2016-07-06 MED ORDER — NITROGLYCERIN 0.4 MG SL SUBL: 0.4000 mg | SUBLINGUAL_TABLET | SUBLINGUAL | Status: DC | PRN

## 2016-07-06 NOTE — ED Triage Notes (Signed)
Pt complaint of nonproductive cough and central chest pressure worse with deep breath onset yesterday.

## 2016-07-06 NOTE — ED Notes (Signed)
RN GETTING LABS FROM IV

## 2016-07-06 NOTE — ED Provider Notes (Signed)
[facility] DEPT Provider Note   CSN: [id number] Arrival date & time: 07/06/16  [id number]     History   Chief Complaint Chief Complaint  Patient presents with  . Chest Pain  . Cough    HPI Sabrina [name] is a 55 y.o. female.  She complains of cough, dry, for 2 weeks. She also said pressure in her anterior chest, for 2 days, which is constant. She denies fever, nausea, vomiting, weakness, dizziness. She is concerned that she might have a "lung blockage", or "heart failure". No other recent illnesses. She is taking her usual medications. She does not monitor her weight closely at home. There are no other no modifying factors.  HPI  Past Medical History:  Diagnosis Date  . Allergy   . Anemia   . Arthritis   . Asthma   . Chronic bronchitis ([place])   . Diabetes mellitus   . Endometriosis   . Fibroid uterus   . GERD (gastroesophageal reflux disease)   . History of Doppler ultrasound    a. Carotid US 8/08:Estimated stenosis in the right and left internal carotid arteries is 0-50% and 0-50%.  . History of echocardiogram    a. Echo 6/16: EF 60-65%, no RWMA.  . History of hiatal hernia   . History of nuclear stress test    a. Myoview 6/16: There is no resting or stress perfusion defect consistent with no prior infarct and no ischemia. EF 73 %  The patient was hypertensive prior and during the study. Cardiac cath 04/2016 showed normal coronary arteries  . Hx of iron deficiency anemia   . Hypersomnia with sleep apnea   . Hypertension   . 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 Menorrhagia   . Obesity   . Pneumonia   . 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 Secondary hyperthyroidism   . Thyroid disease   . Tinnitus    left ear  . Vertigo   . Vitamin D deficiency     Patient Active Problem List   Diagnosis Date Noted  . 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 Connective tissue disease, undifferentiated ([place]) 06/22/2016  . Pulmonary HTN 04/06/2016  . Chronic diastolic heart failure  ([place]) 04/06/2016  . Precordial pain   . Nausea without vomiting 10/18/2015  . Psoriasis 10/18/2015  . Acute renal disease 08/24/2015  . GERD (gastroesophageal reflux disease) 07/05/2015  . History of hypercholesterolemia 07/05/2015  . 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 Headache 05/10/2015  . Scleroderma ([place]) 03/09/2015  . Panniculitis 03/09/2015  . Metabolic syndrome X 0000000  . Seasonal allergies 01/05/2015  . Allergic cough 01/05/2015  . Iron deficiency anemia 09/11/2014  . Tubular adenoma of colon 09/11/2014  . SECONDARY HYPERPARATHYROIDISM 12/02/2008  . Vitamin D deficiency 10/10/2008  . Morbid obesity with BMI of 45.0-49.9, adult ([place]) 03/05/2008  . Diabetes mellitus with renal manifestation ([place]) 08/27/2007  . Essential hypertension 08/27/2007    Past Surgical History:  Procedure Laterality Date  . ABDOMINAL HYSTERECTOMY  2004   supracervical hysterectomy  . BREAST SURGERY  2005   reduction  . BREATH TEK H PYLORI [name] 12/24/2014   Procedure: BREATH TEK H PYLORI;  Surgeon: Greer Pickerel, MD;  Location: [facility];  Service: General;  Laterality: [name];  . CARDIAC CATHETERIZATION [name] 03/22/2016   Procedure: Right/Left Heart Cath and Coronary Angiography;  Surgeon: Troy Sine, MD;  Location: [facility];  Service: Cardiovascular;  Laterality: [name];  . DILATION AND CURETTAGE OF UTERUS  age 18  . ESOPHAGOGASTRODUODENOSCOPY (EGD) WITH PROPOFOL [name] 10/14/2015   Procedure: ESOPHAGOGASTRODUODENOSCOPY (EGD) WITH PROPOFOL;  Surgeon: Clarene Essex, MD;  Location: [facility];  Service: Endoscopy;  Laterality: [name];  . LAPAROSCOPIC GASTRIC SLEEVE RESECTION WITH HIATAL HERNIA REPAIR [name] 07/05/2015   Procedure: LAPAROSCOPIC GASTRIC SLEEVE RESECTION WITH HIATAL HERNIA REPAIR, upper endoscopy;  Surgeon: Greer Pickerel, MD;  Location: [facility]S;  Service: General;  Laterality: [name];    OB History    Gravida Para Term Preterm AB Living   0 0 0 0 0 0   SAB TAB Ectopic  Multiple Live Births   0 0 0 0         Home Medications    Prior to Admission medications   Medication Sig Start Date End Date Taking? Authorizing Provider  aspirin EC 81 MG tablet Take 1 tablet (81 mg total) by mouth daily. 03/20/16   Liliane Shi, PA-C  benzonatate (TESSALON PERLES) 100 MG capsule Take 1 capsule (100 mg total) by mouth 2 (two) times daily as needed for cough. 07/04/16 07/04/17  Fayrene Helper, MD  ergocalciferol (VITAMIN D2) 50000 units capsule Take 1 capsule (50,000 Units total) by mouth once a week. 06/21/16   Fayrene Helper, MD  furosemide (LASIX) 40 MG tablet Take 1 tablet (40 mg total) by mouth daily. 03/19/16   Sueanne Margarita, MD  montelukast (SINGULAIR) 10 MG tablet Take 1 tablet (10 mg total) by mouth at bedtime. 06/07/16   Fayrene Helper, MD  Multiple Vitamin (MULTIVITAMIN WITH MINERALS) TABS tablet Take 1 tablet by mouth daily.    Historical Provider, MD  nitroGLYCERIN (NITROSTAT) 0.4 MG SL tablet Place 0.4 mg under the tongue every 5 (five) minutes as needed for chest pain (3 DOSES MAX).    Historical Provider, MD  ondansetron (ZOFRAN ODT) 4 MG disintegrating tablet One tablet daily, as needed , for nusea 06/07/16   Fayrene Helper, MD  pantoprazole (PROTONIX) 40 MG tablet Take 40 mg by mouth daily as needed (ACID REFLUX).    Historical Provider, MD  predniSONE (STERAPRED UNI-PAK 21 TAB) 5 MG (21) TBPK tablet Take 1 tablet (5 mg total) by mouth as directed. Use as directed 06/21/16   Fayrene Helper, MD  promethazine-dextromethorphan (PROMETHAZINE-DM) 6.25-15 MG/5ML syrup One teaspoon at bedtime, as needed, for excessive cough 06/07/16   Fayrene Helper, MD  triamcinolone ointment (KENALOG) 0.1 % APPLY TO ITCHING SPOTS ON THE BODY DAILY TO TWICE DAILY AS NEEDED. NOT TO FACE. 12/30/15   Fayrene Helper, MD  triamterene-hydrochlorothiazide (MAXZIDE-25) 37.5-25 MG tablet Take 1.5 tablets by mouth daily. ON HOLD TIL FINISHED LASIX 03/14/16   Historical  Provider, MD    Family History Family History  Problem Relation Age of Onset  . Hypertension Mother   . Diabetes Mother   . Arthritis Mother   . Depression Mother   . CAD Mother     s/p PCI  . Diabetes Father   . Stroke Father   . Arthritis Father   . Depression Father   . Hypertension Father   . Cancer Father     prostate  . Hypertension Sister     x 2  . Breast cancer Sister   . Arthritis Sister   . Bleeding Disorder Sister   . Depression Sister   . Thyroid disease Sister   . Colon cancer Maternal Grandfather   . Stroke Maternal Aunt   . Heart attack Neg Hx     Social  History Social History  Substance Use Topics  . Smoking status: Never Smoker  . Smokeless tobacco: Never Used  . Alcohol use No     Allergies   Peanut-containing drug products; Potassium iodide; Ace inhibitors; Codeine; Doxycycline; Amlodipine; Latex; and Penicillins   Review of Systems Review of Systems  All other systems reviewed and are negative.    Physical Exam Updated Vital Signs BP (!) 133/106 (BP Location: Right Arm)   Pulse 89   Temp 99.8 F (37.7 C) (Oral)   Resp 20   LMP 10/31/2002   SpO2 99%   Physical Exam  Constitutional: She is oriented to person, place, and time. She appears well-developed and well-nourished.  HENT:  Head: Normocephalic and atraumatic.  Eyes: Conjunctivae and EOM are normal. Pupils are equal, round, and reactive to light.  Neck: Normal range of motion and phonation normal. Neck supple.  No stridor.  Cardiovascular: Normal rate and regular rhythm.   Pulmonary/Chest: Effort normal and breath sounds normal. No respiratory distress. She has no wheezes. She exhibits no tenderness.  No increased work of breathing.  Abdominal: Soft. She exhibits no distension. There is no tenderness. There is no guarding.  Musculoskeletal: Normal range of motion. She exhibits no edema.  Neurological: She is alert and oriented to person, place, and time. She exhibits  normal muscle tone.  Skin: Skin is warm and dry.  Psychiatric: She has a normal mood and affect. Her behavior is normal. Judgment and thought content normal.  Nursing note and vitals reviewed.    ED Treatments / Results  Labs (all labs ordered are listed, but only abnormal results are displayed) Labs Reviewed  CBC - Abnormal; Notable for the following:       Result Value   WBC 16.7 (*)    RDW 16.2 (*)    All other components within normal limits  BASIC METABOLIC PANEL  TROPONIN I    EKG  EKG Interpretation  Date/Time:  Friday July 06 2016 07:08:55 EST Ventricular Rate:  84 PR Interval:    QRS Duration: 99 QT Interval:  434 QTC Calculation: 514 R Axis:   114 Text Interpretation:  Sinus rhythm Probable RVH w/ secondary repol abnormality Nonspecific T abnormalities, lateral leads Prolonged QT interval Baseline wander in lead(s) I II aVR since last tracing no significant change Confirmed by Eulis Foster  MD, Kadeidra Coryell [phone]) on 07/06/2016 8:00:57 AM       Radiology Dg Chest 2 View  Result Date: 07/06/2016 CLINICAL DATA:  Cough and chest discomfort EXAM: CHEST  2 VIEW COMPARISON:  June 04, 2016 FINDINGS: The degree of inspiration is shallow. There is slight central peribronchial thickening. No edema or consolidation. Heart size and pulmonary vascularity are normal. There is mild degenerative change in the thoracic spine. IMPRESSION: Mild central peribronchial thickening. There may be a degree of acute bronchitis. No edema or consolidation. Stable cardiac silhouette. Electronically Signed   By: Lowella Grip III M.D.   On: 07/06/2016 07:56    Procedures Date/Time: 07/06/2016 8:43 AM Performed by: Daleen Bo       (including critical care time)  Medications Ordered in ED Medications - No data to display   Initial Impression / Assessment and Plan / ED Course  I have reviewed the triage vital signs and the nursing notes.  Pertinent labs & imaging results that  were available during my care of the patient were reviewed by me and considered in my medical decision making (see chart for details).  Clinical Course as  of Jul 06 1601  Fri Jul 06, 2016  0806 DG Chest 2 View [EW]  [id number] No PNE DG Chest 2 View [EW]  1122 She was seen by her pulmonologist, on 06/22/2016, at that time, he decided to initiate treatment with Adcirca + Letaris. Patient has not started these medications yet. She had a cardiac echo following that visit, and this was felt to be stable.  [EW]  1123 Case discussed with pulmonary, Dr. Wonda Amis, who will arrange an evaluation of the patient in the emergency department.  [EW]    Clinical Course User Index [EW] Daleen Bo, MD    Medications - No data to display  Patient Vitals for the past 24 hrs:  BP Temp Temp src Pulse Resp SpO2  07/06/16 0703 (!) 133/106 99.8 F (37.7 C) Oral 89 20 99 %      Final Clinical Impressions(s) / ED Diagnoses   Final diagnoses:  Hypoxia  Dyspnea, unspecified type  Pulmonary hypertension   Nonspecific shortness of breath and dyspnea with history of pulmonary hypertension, and diastolic dysfunction, grade 1, EF 50%, on recent cardiac echo. Recent sleep study showed mild obstructive sleep apnea.    Nursing Notes Reviewed/ Care Coordinated Applicable Imaging Reviewed Interpretation of Laboratory Data incorporated into ED treatment  Admit pulmonary critical care  New Prescriptions New Prescriptions   No medications on file     Daleen Bo, MD 07/06/16 1605

## 2016-07-06 NOTE — Consult Note (Signed)
PULMONARY / CRITICAL CARE MEDICINE   Name: Sabrina [name] MRN: [id number] DOB: [birth year]    ADMISSION DATE:  07/06/2016 CONSULTATION DATE:  07/06/2016  REFERRING MD:  Daleen Bo, M.D. / EDP  CHIEF COMPLAINT:  Acute Hypoxic Respiratory Failure  HISTORY OF PRESENT ILLNESS:  55 y.o. female with known history of diastolic congestive heart failure, systemic hypertension, diabetes, obesity with possible obstructive sleep apnea, possible lupus, and pulmonary hypertension. Previously seen by rheumatology with negative serologies for scleroderma. Her rheumatoid factor has been slightly elevated. Patient was started on Plaquenil by rheumatology. Also noted the patient does have a history of diet pill use. Currently followed by Dr. Lamonte Sakai. Patient's previous ambulatory oximetry prior to earlier this month was normal on room air. Patient was last seen on 11/10 by Dr. Lamonte Sakai with increasing allergy type symptoms as well as a nonproductive cough without significant exertional shortness of breath. She was planned to initiate treatment with Adcirca & Letaris by Dr. Lamonte Sakai as noted in 11/10 note. Patient presented with a two-week history of "dry" cough as well as anterior chest discomfort for 2 days reported as constant. Denied any fever, nausea, vomiting, weakness, or dizziness to emergency room room physician. She was saturating normally on room air on arrival.  Patient reports she has had "bronchitis" for 2.5 weeks. Reports she is had 2 separate courses of prednisone without relief in her cough. Has been taking over-the-counter TheraFlu medication with some relief. Reports her cough is nonproductive. Reports baseline dyspnea and husband does note that 2 months ago was having difficulty walking up an incline with near syncope. Denies any sore throat or sinus congestion. Does endorse "tight, sore" chest discomfort with some pleuritic component with deep inspiration. Denies any recent antibiotic exposure. She  denies any nocturnal awakenings with cough or orthopnea. Patient was noted to desaturate with ambulation in the emergency department. Denies any subjective fever or sweats. Has endorse some mild chills. Denies any abdominal pain or nausea but has had some diarrhea. Denies any other myalgias that are new. No new rashes. No new joint pain, swelling, or erythema. No sick contacts. No recent travel.  PAST MEDICAL HISTORY :  Past Medical History:  Diagnosis Date  . Allergy   . Anemia   . Arthritis   . Asthma   . Chronic bronchitis ([place])   . Diabetes mellitus   . Endometriosis   . Fibroid uterus   . GERD (gastroesophageal reflux disease)   . History of Doppler ultrasound    a. Carotid US 8/08:Estimated stenosis in the right and left internal carotid arteries is 0-50% and 0-50%.  . History of echocardiogram    a. Echo 6/16: EF 60-65%, no RWMA.  . History of hiatal hernia   . History of nuclear stress test    a. Myoview 6/16: There is no resting or stress perfusion defect consistent with no prior infarct and no ischemia. EF 73 %  The patient was hypertensive prior and during the study. Cardiac cath 04/2016 showed normal coronary arteries  . Hx of iron deficiency anemia   . Hypersomnia with sleep apnea   . Hypertension   . 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 Menorrhagia   . Obesity   . Pneumonia   . 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 Secondary hyperthyroidism   . Thyroid disease   . Tinnitus    left ear  . Vertigo   . Vitamin D deficiency     PAST SURGICAL HISTORY: Past Surgical History:  Procedure Laterality Date  . ABDOMINAL HYSTERECTOMY  2004   supracervical hysterectomy  . BREAST SURGERY  2005   reduction  . BREATH TEK H PYLORI [name] 12/24/2014   Procedure: BREATH TEK H PYLORI;  Surgeon: Greer Pickerel, MD;  Location: [facility];  Service: General;  Laterality: [name];  . CARDIAC CATHETERIZATION [name] 03/22/2016   Procedure: Right/Left Heart Cath and Coronary Angiography;  Surgeon: Troy Sine, MD;  Location: [place];  Service: Cardiovascular;  Laterality: [name];  . DILATION AND CURETTAGE OF UTERUS     age 89  . ESOPHAGOGASTRODUODENOSCOPY (EGD) WITH PROPOFOL [name] 10/14/2015   Procedure: ESOPHAGOGASTRODUODENOSCOPY (EGD) WITH PROPOFOL;  Surgeon: Clarene Essex, MD;  Location: [facility];  Service: Endoscopy;  Laterality: [name];  . LAPAROSCOPIC GASTRIC SLEEVE RESECTION WITH HIATAL HERNIA REPAIR [name] 07/05/2015   Procedure: LAPAROSCOPIC GASTRIC SLEEVE RESECTION WITH HIATAL HERNIA REPAIR, upper endoscopy;  Surgeon: Greer Pickerel, MD;  Location: [facility];  Service: General;  Laterality: [name];     Allergies  Allergen Reactions  . Peanut-Containing Drug Products Nausea And Vomiting  . Potassium Iodide Other (See Comments)    facial swelling, also eyes, and ears  . Ace Inhibitors Cough  . Codeine Nausea And Vomiting  . Doxycycline Hives  . Amlodipine Rash  . Latex Hives and Rash  . Penicillins Hives, Itching and Rash    .Marland KitchenHas patient had a PCN reaction causing immediate rash, facial/tongue/throat swelling, SOB or lightheadedness with hypotension: No Has patient had a PCN reaction causing severe rash involving mucus membranes or skin necrosis: No Has patient had a PCN reaction that required hospitalization No Has patient had a PCN reaction occurring within the last 10 years: YES (3 years ago)  If all of the above answers are "NO", then may proceed with Cephalosporin use.     No current facility-administered medications on file prior to encounter.    Current Outpatient Prescriptions on File Prior to Encounter  Medication Sig  . aspirin EC 81 MG tablet Take 1 tablet (81 mg total) by mouth daily.  . benzonatate (TESSALON PERLES) 100 MG capsule Take 1 capsule (100 mg total) by mouth 2 (two) times daily as needed for cough.  . ergocalciferol (VITAMIN D2) 50000 units capsule Take 1 capsule (50,000 Units total) by mouth once a week.  . furosemide (LASIX) 40 MG tablet Take 1 tablet (40  mg total) by mouth daily.  . montelukast (SINGULAIR) 10 MG tablet Take 1 tablet (10 mg total) by mouth at bedtime. (Patient taking differently: Take 10 mg by mouth at bedtime as needed (allergies). )  . Multiple Vitamin (MULTIVITAMIN WITH MINERALS) TABS tablet Take 1 tablet by mouth daily.  . nitroGLYCERIN (NITROSTAT) 0.4 MG SL tablet Place 0.4 mg under the tongue every 5 (five) minutes as needed for chest pain (3 DOSES MAX).  [name] ondansetron (ZOFRAN ODT) 4 MG disintegrating tablet One tablet daily, as needed , for nusea (Patient taking differently: Take 4 mg by mouth every 6 (six) hours as needed for nausea or vomiting. One tablet daily, as needed , for nusea)  . pantoprazole (PROTONIX) 40 MG tablet Take 40 mg by mouth daily as needed (ACID REFLUX).  [name] triamcinolone ointment (KENALOG) 0.1 % APPLY TO ITCHING SPOTS ON THE BODY DAILY TO TWICE DAILY AS NEEDED. NOT TO FACE. (Patient taking differently: APPLY 1 application TO ITCHING SPOTS ON THE BODY DAILY TO TWICE DAILY AS NEEDED for rash/irritation. NOT TO FACE.)  . predniSONE (STERAPRED UNI-PAK 21 TAB) 5 MG (  21) TBPK tablet Take 1 tablet (5 mg total) by mouth as directed. Use as directed (Patient not taking: Reported on 07/06/2016)  . promethazine-dextromethorphan (PROMETHAZINE-DM) 6.25-15 MG/5ML syrup One teaspoon at bedtime, as needed, for excessive cough (Patient not taking: Reported on 07/06/2016)  . triamterene-hydrochlorothiazide (MAXZIDE-25) 37.5-25 MG tablet Take 1.5 tablets by mouth daily.     FAMILY HISTORY:  Family History  Problem Relation Age of Onset  . Hypertension Mother   . Diabetes Mother   . Arthritis Mother   . Depression Mother   . CAD Mother     s/p PCI  . Diabetes Father   . Stroke Father   . Arthritis Father   . Depression Father   . Hypertension Father   . Cancer Father     prostate  . Hypertension Sister     x 2  . Breast cancer Sister   . Arthritis Sister   . Bleeding Disorder Sister   . Depression Sister   .  Thyroid disease Sister   . Colon cancer Maternal Grandfather   . Stroke Maternal Aunt   . Heart attack Neg Hx      SOCIAL HISTORY: Social History   Social History  . Marital status: Married    Spouse name: [name]  . Number of children: [name]  . Years of education: 43   Occupational History  . 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 Smoking status: Never Smoker  . Smokeless tobacco: Never Used  . Alcohol use No  . Drug use: No  . Sexual activity: Yes    Partners: Male    Birth control/ protection: Surgical     Comment: hysterectomy   Other Topics Concern  . None   Social History Narrative   Regular exercise-no   Caffeine Use-no       Pulmonary: Patient lives with her husband. Works for [employer]. No bird or mold exposure. No pets currently.             REVIEW OF SYSTEMS:  No headache or vision changes. No focal weakness or numbness. A pertinent 14 point review of systems is negative except as per the history of presenting illness.  SUBJECTIVE: As above.  VITAL SIGNS: BP 135/82   Pulse 96   Temp 99.8 F (37.7 C) (Oral)   Resp 20   LMP 10/31/2002   SpO2 100%   HEMODYNAMICS:    VENTILATOR SETTINGS:    INTAKE / OUTPUT: No intake/output data recorded.  PHYSICAL EXAMINATION: General:  Awake. Alert. No acute distress. Obese. Husband at bedside.  Integument:  Warm & dry. No bruising on exposed skin. Lymphatics:  No appreciated cervical or supraclavicular lymphadenoapthy. HEENT:  Moist mucus membranes. No oral ulcers. No scleral injection or icterus.  Cardiovascular:  Regular rate. Bilateral lower extremity edema. No appreciable JVD given body habitus. Unable to appreciate any hepatojugular reflux. Pulmonary:  Decreased breath sounds bilateral lung bases. Normal work of breathing on nasal cannula oxygen. Speaking in complete sentences. Abdomen: Soft. Normal bowel sounds. Protuberant. Mildly tender to  palpation right upper quadrant. Musculoskeletal:  Normal bulk and tone. Hand grip strength 5/5 bilaterally. No joint deformity or effusion appreciated. No synovial thickening of bilateral PIP and DIP joints. Neurological:  CN 2-12 grossly in tact. No meningismus. Moving all 4 extremities equally. Symmetric brachioradialis deep tendon reflexes. Psychiatric:  Mood and affect congruent. Speech normal rhythm, rate & tone.   LABS:  BMET  Recent Labs Lab 07/06/16  0823  NA 140  K 2.9*  CL 97*  CO2 33*  BUN 15  CREATININE 1.26*  GLUCOSE 128*    Electrolytes  Recent Labs Lab 07/06/16 0823  CALCIUM 6.4*    CBC  Recent Labs Lab 07/06/16 0823  WBC 16.7*  HGB 12.5  HCT 38.8  PLT 303    Coag's No results for input(s): APTT, INR in the last 168 hours.  Sepsis Markers No results for input(s): LATICACIDVEN, PROCALCITON, O2SATVEN in the last 168 hours.  ABG No results for input(s): PHART, PCO2ART, PO2ART in the last 168 hours.  Liver Enzymes No results for input(s): AST, ALT, ALKPHOS, BILITOT, ALBUMIN in the last 168 hours.  Cardiac Enzymes  Recent Labs Lab 07/06/16 0823  TROPONINI <0.03    Glucose No results for input(s): GLUCAP in the last 168 hours.  Imaging Dg Chest 2 View  Result Date: 07/06/2016 CLINICAL DATA:  Cough and chest discomfort EXAM: CHEST  2 VIEW COMPARISON:  June 04, 2016 FINDINGS: The degree of inspiration is shallow. There is slight central peribronchial thickening. No edema or consolidation. Heart size and pulmonary vascularity are normal. There is mild degenerative change in the thoracic spine. IMPRESSION: Mild central peribronchial thickening. There may be a degree of acute bronchitis. No edema or consolidation. Stable cardiac silhouette. Electronically Signed   By: Lowella Grip III M.D.   On: 07/06/2016 07:56     STUDIES:  TTE (03/22/16): LV normal in size with EF 50-55%. No regional wall motion abnormalities. Grade 1 diastolic  dysfunction. LA & RA normal in size. RV poorly visualized with reduced systolic function. Cavity size of RV appeared normal. Flattening of the ventricular septum and diastole and systole consistent with right ventricular pressure and volume overload. No aortic stenosis or regurgitation. Aortic root normal in size. Trivial mitral regurgitation without stenosis. No pulmonic regurgitation or stenosis. Mild tricuspid regurgitation. Trivial pericardial effusion.  RHC (03/22/16): RV: 106/9. PA: 107/29; mean 61 PW: a 27, v 23; mean 22 AO: 151/99 PA: 114/33 LV: 153/7. PW: A wave 27, the 23; mean 22 LV: 156/9 AO: 150/80 Oxygen saturation in the AO 94% and in the PDA 60%. CI:  2.1 & 1.7 L/m/m2 CO:  4.0 & 3.2 L/m  Home PSG (06/24/16): Mild OSA with AHI 8.67/hour & low saturation 83%.  CXR PA/LAT 07/06/16:  Personally reviewed by me. No pleural effusion. No focal opacity. Low lung volumes. Cardiomegaly noted.  MICROBIOLOGY: None.  ANTIBIOTICS: None.  SIGNIFICANT EVENTS: 11/24 - Presented to ED w/ hypoxia & dyspnea  LINES/TUBES: PIV  ASSESSMENT / PLAN:  55 y.o. female with known pulmonary hypertension as well as multiple medical problems. Patient's previous right heart catheterization suggested some component of left ventricular dysfunction contributing to her pulmonary hypertension. Certainly with her mild obstructive sleep apnea, connective tissue disease, and prior history of diet pill use her pulmonary hypertension is difficult to attribute to 1 specific cause. She has not started on pulmonary arterial vasodilator therapy as of yet. The patient's dyspnea does not seem to have acutely worsened but her cough certainly has suggesting possible volume overload and may be some mild acute diastolic congestive heart failure. There is no infectious symptoms or suggestion from her history. Her chest discomfort seems to be more pleuritic in nature. I will plan on placing the patient in observation while  assessment is continued.  1. Acute hypoxic respiratory failure: Weaning FiO2 for saturation greater than 92%. Lasix 20 mg IV 1. Checking serum BNP. Consider transthoracic echocardiogram. BNP  is significantly elevated. Continuous monitoring of pulse oximetry. Plan for ambulatory O2 saturation prior to discharge from hospital. 2. Chest pain/pleurisy: Checking troponin I and trending every 6 hours. Checking ESR & CRP. Holding on steroids at this time. Toradol & Tylenol when necessary for pain. 3. Right upper quadrant pain with diarrhea: Checking hepatic function panel & right upper quadrant ultrasound. 4. Cough: Suspect secondary to decompensated diastolic congestive heart failure. Checking BNP. Cough suppression with Tessalon Perles. 5. Pulmonary hypertension: Cautious IV Lasix for intermittent diuresis. Checking BNP. Continue plan for outpatient initiation of pulmonary arterial vasodilator therapy. 6. Diastolic congestive heart failure: Continuing aspirin 81 mg by mouth daily. Monitoring patient on telemetry continuously. 7. OSA: Mild by home polysomnogram. Continuous pulse oximetry monitoring with nocturnal oxygen therapy. Deferring initiation of inpatient CPAP therapy at this time. 8. Hypertension: Holding home Maxide. Monitoring vitals per protocol. 9. History of diabetes mellitus: Accu-Cheks every before meals & at bedtime with notification parameters for M.D. 10. Asthma: Continuing home Singulair. 11. 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 12. Diet: Heart healthy carb modified diet. 13. Prophylaxis: SCDs & Lovenox subcutaneous daily. 14. Disposition: Anticipate discharge to home in the next 24-48 hours pending workup and clinical improvement.   Sonia Baller Ashok Cordia, M.D. [facility] Pulmonary & Critical Care Pager:  [phone] After 3pm or if no response, call [phone] 07/06/2016, 12:51 PM

## 2016-07-07 DIAGNOSIS — I272 Pulmonary hypertension, unspecified: Secondary | ICD-10-CM | POA: Diagnosis not present

## 2016-07-07 DIAGNOSIS — E876 Hypokalemia: Secondary | ICD-10-CM

## 2016-07-07 DIAGNOSIS — J9601 Acute respiratory failure with hypoxia: Secondary | ICD-10-CM | POA: Diagnosis not present

## 2016-07-07 HISTORY — DX: Hypocalcemia: E83.51

## 2016-07-07 LAB — CBC
HEMATOCRIT: 38.8 % (ref 36.0–46.0)
HEMOGLOBIN: 12.5 g/dL (ref 12.0–15.0)
MCH: 27.8 pg (ref 26.0–34.0)
MCHC: 32.2 g/dL (ref 30.0–36.0)
MCV: 86.4 fL (ref 78.0–100.0)
Platelets: 298 10*3/uL (ref 150–400)
RBC: 4.49 MIL/uL (ref 3.87–5.11)
RDW: 16.1 % — ABNORMAL HIGH (ref 11.5–15.5)
WBC: 15.9 10*3/uL — AB (ref 4.0–10.5)

## 2016-07-07 LAB — BASIC METABOLIC PANEL
ANION GAP: 13 (ref 5–15)
BUN: 20 mg/dL (ref 6–20)
CO2: 29 mmol/L (ref 22–32)
Calcium: 6.8 mg/dL — ABNORMAL LOW (ref 8.9–10.3)
Chloride: 97 mmol/L — ABNORMAL LOW (ref 101–111)
Creatinine, Ser: 1.24 mg/dL — ABNORMAL HIGH (ref 0.44–1.00)
GFR calc Af Amer: 57 mL/min — ABNORMAL LOW (ref >60)
GFR calc non Af Amer: 49 mL/min — ABNORMAL LOW (ref >60)
GLUCOSE: 101 mg/dL — AB (ref 65–99)
POTASSIUM: 3.3 mmol/L — AB (ref 3.5–5.1)
Sodium: 139 mmol/L (ref 135–145)

## 2016-07-07 LAB — GLUCOSE, CAPILLARY
GLUCOSE-CAPILLARY: 94 mg/dL (ref 65–99)
GLUCOSE-CAPILLARY: 65 mg/dL (ref 65–99)
Glucose-Capillary: 84 mg/dL (ref 65–99)
Glucose-Capillary: 66 mg/dL (ref 65–99)
Glucose-Capillary: 151 mg/dL — ABNORMAL HIGH (ref 65–99)

## 2016-07-07 LAB — TROPONIN I

## 2016-07-07 LAB — MAGNESIUM: Magnesium: 1.9 mg/dL (ref 1.7–2.4)

## 2016-07-07 LAB — PHOSPHORUS: Phosphorus: 6 mg/dL — ABNORMAL HIGH (ref 2.5–4.6)

## 2016-07-07 MED ORDER — SODIUM CHLORIDE 0.9 % IV SOLN
1.0000 g | Freq: Once | INTRAVENOUS | Status: AC
  Administered 2016-07-07: 1 g via INTRAVENOUS
  Filled 2016-07-07: qty 10

## 2016-07-07 MED ORDER — GUAIFENESIN-DM 100-10 MG/5ML PO SYRP
5.0000 mL | ORAL_SOLUTION | Freq: Four times a day (QID) | ORAL | Status: DC | PRN
  Administered 2016-07-07 – 2016-07-08 (×2): 5 mL via ORAL
  Filled 2016-07-07 (×3): qty 10

## 2016-07-07 MED ORDER — POTASSIUM CHLORIDE CRYS ER 20 MEQ PO TBCR
20.0000 meq | EXTENDED_RELEASE_TABLET | Freq: Two times a day (BID) | ORAL | Status: DC
  Administered 2016-07-07 – 2016-07-09 (×5): 20 meq via ORAL
  Filled 2016-07-07 (×5): qty 1

## 2016-07-07 NOTE — Progress Notes (Signed)
BS 66. Juice given and lunch ordered. Denies s/sx of hypoglycemia. Continue with plan of care

## 2016-07-07 NOTE — Progress Notes (Signed)
Hypoglycemic Event  CBG: 65  Treatment: juice  Symptoms: none  Follow-up CBG: Time: 2220 CBG Result: 151  Possible Reasons for Event: unknon  Comments/MD notified: protocol followed    Harlene Ramus A

## 2016-07-07 NOTE — Progress Notes (Signed)
PULMONARY / CRITICAL CARE MEDICINE   Name: Sabrina [name] MRN: [id number] DOB: 10/29/1960    ADMISSION DATE:  07/06/2016 CONSULTATION DATE:  07/06/2016  REFERRING MD:  Daleen Bo, M.D. / EDP  CHIEF COMPLAINT:  Acute Hypoxic Respiratory Failure  HISTORY OF PRESENT ILLNESS:  55 y.o. female with known history of diastolic congestive heart failure, systemic hypertension, diabetes, obesity with possible obstructive sleep apnea, possible lupus, and pulmonary hypertension. Previously seen by rheumatology with negative serologies for scleroderma. Her rheumatoid factor has been slightly elevated. Patient was started on Plaquenil by rheumatology. Also noted the patient does have a history of diet pill use. Currently followed by Dr. Lamonte Sakai. Patient's previous ambulatory oximetry prior to earlier this month was normal on room air. Patient was last seen on 11/10 by Dr. Lamonte Sakai with increasing allergy type symptoms as well as a nonproductive cough without significant exertional shortness of breath. She was planned to initiate treatment with Adcirca & Letaris by Dr. Lamonte Sakai as noted in 11/10 note. Patient presented with a two-week history of "dry" cough as well as anterior chest discomfort for 2 days reported as constant. Denied any fever, nausea, vomiting, weakness, or dizziness to emergency room room physician. She was saturating normally on room air on arrival.  Patient reports she has had "bronchitis" for 2.5 weeks. Reports she is had 2 separate courses of prednisone without relief in her cough. Has been taking over-the-counter TheraFlu medication with some relief. Reports her cough is nonproductive. Reports baseline dyspnea and husband does note that 2 months ago was having difficulty walking up an incline with near syncope. Denies any sore throat or sinus congestion. Does endorse "tight, sore" chest discomfort with some pleuritic component with deep inspiration. Denies any recent antibiotic exposure. She  denies any nocturnal awakenings with cough or orthopnea. Patient was noted to desaturate with ambulation in the emergency department. Denies any subjective fever or sweats. Has endorse some mild chills. Denies any abdominal pain or nausea but has had some diarrhea. Denies any other myalgias that are new. No new rashes. No new joint pain, swelling, or erythema. No sick contacts. No recent travel.    SUBJECTIVE: Today less cough, chest tightness resolved. Thinks Tramadol helped. Says she has potassium at home, not taken lately.  VITAL SIGNS: BP 129/89 (BP Location: Right Arm)   Pulse 84   Temp 98.2 F (36.8 C) (Oral)   Resp 18   Ht 5' (1.524 m)   Wt 95.4 kg (210 lb 5.1 oz)   LMP 10/31/2002   SpO2 100%   BMI 41.08 kg/m   HEMODYNAMICS:    VENTILATOR SETTINGS:    INTAKE / OUTPUT: I/O last 3 completed shifts: In: 81 [P.O.:480; IV Piggyback:110] Out: -   PHYSICAL EXAMINATION: General:  Awake. Alert. No acute distress. Obese. Up in chair Integument:  Dry, thickened, excoriated on back Lymphatics:  No appreciated cervical or supraclavicular lymphadenoapthy. HEENT:  Moist mucus membranes. No oral ulcers. No scleral injection or icterus.  Cardiovascular:  Regular rate. Bilateral lower extremity edema. No appreciable JVD given body habitus. Unable to appreciate any hepatojugular reflux. P2 loud Pulmonary:  Decreased breath sounds bilateral lung bases, bilateral crackles. Normal work of breathing on nasal cannula oxygen. Speaking in complete sentences. Abdomen: Soft. Normal bowel sounds. Protuberant.  Musculoskeletal:  Normal bulk and tone. Neurological:  CN 2-12 grossly in tact. No meningismus. Moving all 4 extremities equally. Symmetric brachioradialis deep tendon reflexes. Psychiatric:  Mood and affect congruent. Speech normal rhythm, rate & tone.  LABS:  BMET  Recent Labs Lab 07/06/16 0823 07/07/16 0125  NA 140 139  K 2.9* 3.3*  CL 97* 97*  CO2 33* 29  BUN 15 20   CREATININE 1.26* 1.24*  GLUCOSE 128* 101*    Electrolytes  Recent Labs Lab 07/06/16 0823 07/07/16 0125  CALCIUM 6.4* 6.8*  MG  --  1.9  PHOS  --  6.0*    CBC  Recent Labs Lab 07/06/16 0823 07/07/16 0125  WBC 16.7* 15.9*  HGB 12.5 12.5  HCT 38.8 38.8  PLT 303 298    Coag's No results for input(s): APTT, INR in the last 168 hours.  Sepsis Markers  Recent Labs Lab 07/06/16 1409  PROCALCITON 0.24    ABG No results for input(s): PHART, PCO2ART, PO2ART in the last 168 hours.  Liver Enzymes  Recent Labs Lab 07/06/16 1409  AST 26  ALT 21  ALKPHOS 59  BILITOT 0.9  ALBUMIN 3.3*    Cardiac Enzymes  Recent Labs Lab 07/06/16 1409 07/06/16 2006 07/07/16 0125  TROPONINI <0.03 <0.03 <0.03    Glucose  Recent Labs Lab 07/06/16 1708 07/06/16 2047 07/07/16 0805 07/07/16 1231  GLUCAP 98 103* 84 66    Imaging US Abdomen Limited Ruq  Result Date: 07/06/2016 CLINICAL DATA:  Right upper quadrant pain for 2 days EXAM: US ABDOMEN LIMITED - RIGHT UPPER QUADRANT COMPARISON:  None. FINDINGS: Gallbladder: No gallstones or wall thickening visualized. No sonographic Murphy sign noted by sonographer. Common bile duct: Diameter: 5.5 mm Liver: No focal lesion identified. Within normal limits in parenchymal echogenicity. IMPRESSION: No acute abnormality noted. Electronically Signed   By: Inez Catalina M.D.   On: 07/06/2016 17:16     STUDIES:  TTE (03/22/16): LV normal in size with EF 50-55%. No regional wall motion abnormalities. Grade 1 diastolic dysfunction. LA & RA normal in size. RV poorly visualized with reduced systolic function. Cavity size of RV appeared normal. Flattening of the ventricular septum and diastole and systole consistent with right ventricular pressure and volume overload. No aortic stenosis or regurgitation. Aortic root normal in size. Trivial mitral regurgitation without stenosis. No pulmonic regurgitation or stenosis. Mild tricuspid regurgitation.  Trivial pericardial effusion.  RHC (03/22/16): RV: 106/9. PA: 107/29; mean 61 PW: a 27, v 23; mean 22 AO: 151/99 PA: 114/33 LV: 153/7. PW: A wave 27, the 23; mean 22 LV: 156/9 AO: 150/80 Oxygen saturation in the AO 94% and in the PDA 60%. CI:  2.1 & 1.7 L/m/m2 CO:  4.0 & 3.2 L/m  Home PSG (06/24/16): Mild OSA with AHI 8.67/hour & low saturation 83%.  CXR PA/LAT 07/06/16:  Personally reviewed by me. No pleural effusion. No focal opacity. Low lung volumes. Cardiomegaly noted.  MICROBIOLOGY: None.  ANTIBIOTICS: None.  SIGNIFICANT EVENTS: 11/24 - Presented to ED w/ hypoxia & dyspnea  LINES/TUBES: PIV  ASSESSMENT / PLAN:  55 y.o. female with known pulmonary hypertension as well as multiple medical problems. Patient's previous right heart catheterization suggested some component of left ventricular dysfunction contributing to her pulmonary hypertension. Certainly with her mild obstructive sleep apnea, connective tissue disease, and prior history of diet pill use her pulmonary hypertension is difficult to attribute to 1 specific cause. She has not started on pulmonary arterial vasodilator therapy as of yet. The patient's dyspnea does not seem to have acutely worsened but her cough certainly has suggesting possible volume overload and may be some mild acute diastolic congestive heart failure. There is no infectious symptoms or suggestion from her  history. Her chest discomfort seems to be more pleuritic in nature. I will plan on placing the patient in observation while assessment is continued. Addend 11/25- feeling better and credits tramadol, although mild diuresis may have helped. She understands she has appointment 12/11 w Dr Lamonte Sakai for PHTN.  Hypokalemia- ordering oral potassium while here  Hypocalcemia- will repeat IV calcium x 1   1. Acute hypoxic respiratory failure: Weaning FiO2 for saturation greater than 92%. Lasix 20 mg IV 1. Checking serum BNP. Consider transthoracic  echocardiogram. BNP is significantly elevated. Continuous monitoring of pulse oximetry. Plan for ambulatory O2 saturation prior to discharge from hospital. 2. Chest pain/pleurisy: Checking troponin I and trending every 6 hours. Checking ESR & CRP. Holding on steroids at this time. Toradol & Tylenol when necessary for pain. 3. Right upper quadrant pain with diarrhea: Checking hepatic function panel & right upper quadrant ultrasound. 4. Cough: Suspect secondary to decompensated diastolic congestive heart failure. Checking BNP. Cough suppression with Tessalon Perles. 5. Pulmonary hypertension: Cautious IV Lasix for intermittent diuresis. Checking BNP. Continue plan for outpatient initiation of pulmonary arterial vasodilator therapy. 6. Diastolic congestive heart failure: Continuing aspirin 81 mg by mouth daily. Monitoring patient on telemetry continuously. 7. OSA: Mild by home polysomnogram. Continuous pulse oximetry monitoring with nocturnal oxygen therapy. Deferring initiation of inpatient CPAP therapy at this time. 8. Hypertension: Holding home Maxide. Monitoring vitals per protocol. 9. History of diabetes mellitus: Accu-Cheks every before meals & at bedtime with notification parameters for M.D. 10. Asthma: Continuing home Singulair. 11. 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 12. Diet: Heart healthy carb modified diet. 13. Prophylaxis: SCDs & Lovenox subcutaneous daily. 14. Disposition: Anticipate discharge to home in the next 24-48 hours pending workup and clinical improvement.   CD Thatcher Doberstein M.D. [facility] Pager:  [phone] After 3pm or if no response, call [phone] 07/07/2016, 12:57 PM

## 2016-07-08 DIAGNOSIS — I272 Pulmonary hypertension, unspecified: Secondary | ICD-10-CM | POA: Diagnosis not present

## 2016-07-08 DIAGNOSIS — E876 Hypokalemia: Secondary | ICD-10-CM | POA: Diagnosis not present

## 2016-07-08 LAB — BASIC METABOLIC PANEL
ANION GAP: 11 (ref 5–15)
BUN: 22 mg/dL — AB (ref 6–20)
CALCIUM: 7 mg/dL — AB (ref 8.9–10.3)
CO2: 29 mmol/L (ref 22–32)
Chloride: 99 mmol/L — ABNORMAL LOW (ref 101–111)
Creatinine, Ser: 1.12 mg/dL — ABNORMAL HIGH (ref 0.44–1.00)
GFR calc Af Amer: >60 mL/min (ref >60)
GFR, EST NON AFRICAN AMERICAN: 55 mL/min — AB (ref >60)
GLUCOSE: 89 mg/dL (ref 65–99)
Potassium: 3.2 mmol/L — ABNORMAL LOW (ref 3.5–5.1)
Sodium: 139 mmol/L (ref 135–145)

## 2016-07-08 LAB — GLUCOSE, CAPILLARY
GLUCOSE-CAPILLARY: 83 mg/dL (ref 65–99)
GLUCOSE-CAPILLARY: 62 mg/dL — AB (ref 65–99)
Glucose-Capillary: 77 mg/dL (ref 65–99)
Glucose-Capillary: 109 mg/dL — ABNORMAL HIGH (ref 65–99)
Glucose-Capillary: 85 mg/dL (ref 65–99)

## 2016-07-08 NOTE — Progress Notes (Signed)
SATURATION QUALIFICATIONS: (This note is used to comply with regulatory documentation for home oxygen)  Patient Saturations on Room Air at Rest = 100%  Patient Saturations on Room Air while Ambulating = 95-97%  Patient Saturations on  Liters of oxygen while Ambulating =   Please briefly explain why patient needs home oxygen:

## 2016-07-08 NOTE — Progress Notes (Signed)
PULMONARY / CRITICAL CARE MEDICINE   Name: Sabrina [name] MRN: [id number] DOB: November 29, 1960    ADMISSION DATE:  07/06/2016 CONSULTATION DATE:  07/06/2016  REFERRING MD:  Daleen Bo, M.D. / EDP  CHIEF COMPLAINT:  Acute Hypoxic Respiratory Failure  HISTORY OF PRESENT ILLNESS:  55 y.o. female with known history of diastolic congestive heart failure, systemic hypertension, diabetes, obesity with possible obstructive sleep apnea, possible lupus, and pulmonary hypertension. Previously seen by rheumatology with negative serologies for scleroderma. Her rheumatoid factor has been slightly elevated. Patient was started on Plaquenil by rheumatology. Also noted the patient does have a history of diet pill use. Currently followed by Dr. Lamonte Sakai. Patient's previous ambulatory oximetry prior to earlier this month was normal on room air. Patient was last seen on 11/10 by Dr. Lamonte Sakai with increasing allergy type symptoms as well as a nonproductive cough without significant exertional shortness of breath. She was planned to initiate treatment with Adcirca & Letaris by Dr. Lamonte Sakai as noted in 11/10 note. Patient presented with a two-week history of "dry" cough as well as anterior chest discomfort for 2 days reported as constant. Denied any fever, nausea, vomiting, weakness, or dizziness to emergency room room physician. She was saturating normally on room air on arrival.  Patient reports she has had "bronchitis" for 2.5 weeks. Reports she is had 2 separate courses of prednisone without relief in her cough. Has been taking over-the-counter TheraFlu medication with some relief. Reports her cough is nonproductive. Reports baseline dyspnea and husband does note that 2 months ago was having difficulty walking up an incline with near syncope. Denies any sore throat or sinus congestion. Does endorse "tight, sore" chest discomfort with some pleuritic component with deep inspiration. Denies any recent antibiotic exposure. She  denies any nocturnal awakenings with cough or orthopnea. Patient was noted to desaturate with ambulation in the emergency department. Denies any subjective fever or sweats. Has endorse some mild chills. Denies any abdominal pain or nausea but has had some diarrhea. Denies any other myalgias that are new. No new rashes. No new joint pain, swelling, or erythema. No sick contacts. No recent travel.    SUBJECTIVE: Today less cough, chest tightness resolved. Thinks Tramadol helped. Says she has potassium at home, not taken lately.  VITAL SIGNS: BP 125/86 (BP Location: Left Arm)   Pulse 77   Temp 98.6 F (37 C) (Oral)   Resp 18   Ht 5' (1.524 m)   Wt 93.6 kg (206 lb 6.4 oz)   LMP 10/31/2002   SpO2 95%   BMI 40.31 kg/m   HEMODYNAMICS:    VENTILATOR SETTINGS:    INTAKE / OUTPUT: I/O last 3 completed shifts: In: 44 [P.O.:480; IV Piggyback:110] Out: -   PHYSICAL EXAMINATION: General:  Awake. Alert. No acute distress. Obese.lying in bed Integument:  Dry, thickened, excoriated on back Lymphatics:  No appreciated cervical or supraclavicular lymphadenoapthy. HEENT:  Moist mucus membranes. No oral ulcers. No scleral injection or icterus.  Cardiovascular:  Regular rate. Bilateral lower extremity edema. No appreciable JVD given body habitus. Unable to appreciate any hepatojugular reflux. P2 loud Pulmonary:  Decreased breath sounds bilateral lung bases, bilateral crackles. Normal work of breathing on room air Speaking in complete sentences. Abdomen: Soft. Normal bowel sounds. Protuberant.  Musculoskeletal:  Normal bulk and tone. Neurological:  CN 2-12 grossly in tact. No meningismus. Moving all 4 extremities equally. Symmetric brachioradialis deep tendon reflexes. Psychiatric:  Mood and affect congruent. Speech normal rhythm, rate & tone.   LABS:  BMET  Recent Labs Lab 07/06/16 0823 07/07/16 0125 07/08/16 0532  NA 140 139 139  K 2.9* 3.3* 3.2*  CL 97* 97* 99*  CO2 33* 29 29  BUN  15 20 22*  CREATININE 1.26* 1.24* 1.12*  GLUCOSE 128* 101* 89    Electrolytes  Recent Labs Lab 07/06/16 0823 07/07/16 0125 07/08/16 0532  CALCIUM 6.4* 6.8* 7.0*  MG  --  1.9  --   PHOS  --  6.0*  --     CBC  Recent Labs Lab 07/06/16 0823 07/07/16 0125  WBC 16.7* 15.9*  HGB 12.5 12.5  HCT 38.8 38.8  PLT 303 298    Coag's No results for input(s): APTT, INR in the last 168 hours.  Sepsis Markers  Recent Labs Lab 07/06/16 1409  PROCALCITON 0.24    ABG No results for input(s): PHART, PCO2ART, PO2ART in the last 168 hours.  Liver Enzymes  Recent Labs Lab 07/06/16 1409  AST 26  ALT 21  ALKPHOS 59  BILITOT 0.9  ALBUMIN 3.3*    Cardiac Enzymes  Recent Labs Lab 07/06/16 1409 07/06/16 2006 07/07/16 0125  TROPONINI <0.03 <0.03 <0.03    Glucose  Recent Labs Lab 07/07/16 0805 07/07/16 1231 07/07/16 1752 07/07/16 2146 07/07/16 2220 07/08/16 0824  GLUCAP 84 66 94 65 151* 77    Imaging No results found.   STUDIES:  TTE (03/22/16): LV normal in size with EF 50-55%. No regional wall motion abnormalities. Grade 1 diastolic dysfunction. LA & RA normal in size. RV poorly visualized with reduced systolic function. Cavity size of RV appeared normal. Flattening of the ventricular septum and diastole and systole consistent with right ventricular pressure and volume overload. No aortic stenosis or regurgitation. Aortic root normal in size. Trivial mitral regurgitation without stenosis. No pulmonic regurgitation or stenosis. Mild tricuspid regurgitation. Trivial pericardial effusion.  RHC (03/22/16): RV: 106/9. PA: 107/29; mean 61 PW: a 27, v 23; mean 22 AO: 151/99 PA: 114/33 LV: 153/7. PW: A wave 27, the 23; mean 22 LV: 156/9 AO: 150/80 Oxygen saturation in the AO 94% and in the PDA 60%. CI:  2.1 & 1.7 L/m/m2 CO:  4.0 & 3.2 L/m  Home PSG (06/24/16): Mild OSA with AHI 8.67/hour & low saturation 83%.  CXR PA/LAT 07/06/16:  reviewed  MICROBIOLOGY: None.  ANTIBIOTICS: None.  SIGNIFICANT EVENTS: 11/24 - Presented to ED w/ hypoxia & dyspnea  LINES/TUBES: PIV  ASSESSMENT / PLAN:  55 y.o. female with known pulmonary hypertension as well as multiple medical problems. Patient's previous right heart catheterization suggested some component of left ventricular dysfunction contributing to her pulmonary hypertension. Certainly with her mild obstructive sleep apnea, connective tissue disease, and prior history of diet pill use her pulmonary hypertension is difficult to attribute to 1 specific cause. She has not started on pulmonary arterial vasodilator therapy as of yet. The patient's dyspnea does not seem to have acutely worsened but her cough certainly has suggesting possible volume overload and may be some mild acute diastolic congestive heart failure. There is no infectious symptoms or suggestion from her history. Her chest discomfort seems to be more pleuritic in nature. I will plan on placing the patient in observation while assessment is continued. Addend 11/25- feeling better and credits tramadol, although mild diuresis may have helped. She understands she has appointment 12/11 w Dr Lamonte Sakai for PHTN.  DM2 w renal insufficiency- Has not needed specific Rx since bariatric surgery last year. On CBGs here glu up to 155, low to  65( treated w juice)  Hypokalemia- K today 3.2 after starting Klor 20 bid on 11/25. This was her home dose until she ran out a week before admit. Needs to resume at discharge.  Hypocalcemia- Has had IV calcium x 2   1. Acute hypoxic respiratory failure: Weaning FiO2 for saturation greater than 92%. Lasix 20 mg IV 1.  Consider transthoracic echocardiogram. BNP is significantly elevated. Continuous monitoring of pulse oximetry. Plan for ambulatory O2 saturation prior to discharge from hospital.- ordered 2. Chest pain/pleurisy: Checking troponin I and trending every 6 hours. Checking ESR & CRP.  Holding on steroids at this time. Toradol & Tylenol when necessary for pain. 3. Right upper quadrant pain with diarrhea: Checking hepatic function panel & right upper quadrant ultrasound. Resolved 4. Cough: Suspect secondary to decompensated diastolic congestive heart failure. Checking BNP. Cough suppression with Tessalon Perles. 5. Pulmonary hypertension: Cautious IV Lasix for intermittent diuresis. Checking BNP. Continue plan for outpatient initiation of pulmonary arterial vasodilator therapy. Not on home O2 6. Diastolic congestive heart failure: Continuing aspirin 81 mg by mouth daily. Monitoring patient on telemetry continuously. 7. OSA: Mild by home polysomnogram. Continuous pulse oximetry monitoring with nocturnal oxygen therapy. Deferring initiation of inpatient CPAP therapy at this time. 8. Hypertension: Holding home Maxide. Monitoring vitals per protocol. 9. History of diabetes mellitus: Accu-Cheks every before meals & at bedtime with notification parameters for M.D. 10. Asthma: Continuing home Singulair. 11. 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 12. Diet: Heart healthy carb modified diet. 13. Prophylaxis: SCDs & Lovenox subcutaneous daily. 14. Disposition: Anticipate discharge to home 11/27   CD Young M.D. [facility] Pager:  [phone] After 3pm or if no response, call [phone] 07/08/2016, 11:55 AM

## 2016-07-09 ENCOUNTER — Telehealth: Payer: Self-pay | Admitting: Emergency Medicine

## 2016-07-09 LAB — BASIC METABOLIC PANEL
Anion gap: 13 (ref 5–15)
BUN: 20 mg/dL (ref 6–20)
CALCIUM: 7.1 mg/dL — AB (ref 8.9–10.3)
CHLORIDE: 106 mmol/L (ref 101–111)
CO2: 21 mmol/L — AB (ref 22–32)
CREATININE: 0.93 mg/dL (ref 0.44–1.00)
GFR calc Af Amer: >60 mL/min (ref >60)
GFR calc non Af Amer: >60 mL/min (ref >60)
GLUCOSE: 84 mg/dL (ref 65–99)
Potassium: 5 mmol/L (ref 3.5–5.1)
Sodium: 140 mmol/L (ref 135–145)

## 2016-07-09 LAB — GLUCOSE, CAPILLARY
Glucose-Capillary: 75 mg/dL (ref 65–99)
Glucose-Capillary: 79 mg/dL (ref 65–99)

## 2016-07-09 MED ORDER — POTASSIUM CHLORIDE CRYS ER 20 MEQ PO TBCR: 20.0000 meq | EXTENDED_RELEASE_TABLET | Freq: Two times a day (BID) | ORAL | 6 refills | Status: DC

## 2016-07-09 MED ORDER — PANTOPRAZOLE SODIUM 40 MG PO TBEC
40.0000 mg | DELAYED_RELEASE_TABLET | Freq: Every day | ORAL | 6 refills | Status: DC
Start: 2016-07-09 — End: 2016-12-12

## 2016-07-09 MED ORDER — GUAIFENESIN-DM 100-10 MG/5ML PO SYRP: 5.0000 mL | ORAL_SOLUTION | Freq: Four times a day (QID) | ORAL | 0 refills | Status: DC | PRN

## 2016-07-09 NOTE — Progress Notes (Signed)
CHF Zone Tool handout given to patient at discharge. Eulas Post, RN

## 2016-07-09 NOTE — Discharge Summary (Signed)
Physician Discharge Summary       Patient ID: Sabrina [name] MRN: [id number] DOB/AGE: [birth year] 55 y.o.  Admit date: 07/06/2016 Discharge date: 07/09/2016  Discharge Diagnoses:   Acute hypoxic respiratory failure  Chest pain/pleurisy Right upper quadrant pain with diarrhea: Cough Pulmonary hypertension Diastolic congestive heart failure OSA Hypertension History of diabetes mellitus Asthma Connective tissue disease/cutaneous lupus  Detailed Hospital Course:  55 y.o. female with known history of diastolic congestive heart failure, systemic hypertension, diabetes, obesity with possible obstructive sleep apnea, possible lupus, and pulmonary hypertension. Previously seen by rheumatology with negative serologies for scleroderma. Her rheumatoid factor has been slightly elevated. Patient was started on Plaquenil by rheumatology. Also noted the patient does have a history of diet pill use. Currently followed by Dr. Lamonte Sakai. Patient's previous ambulatory oximetry prior to earlier this month was normal on room air. Patient was last seen on 11/10 by Dr. Lamonte Sakai with increasing allergy type symptoms as well as a nonproductive cough without significant exertional shortness of breath. She was planned to initiate treatment with Adcirca & Letaris by Dr. Lamonte Sakai as noted in 11/10 note. Patient presented with a two-week history of "dry" cough as well as anterior chest discomfort for 2 days reported as constant. Denied any fever, nausea, vomiting, weakness, or dizziness to emergency room room physician. She was saturating normally on room air on arrival.  Patient reports she has had "bronchitis" for 2.5 weeks. Reports she is had 2 separate courses of prednisone without relief in her cough. Has been taking over-the-counter TheraFlu medication with some relief. Reports her cough is nonproductive. Reports baseline dyspnea and husband does note that 2 months ago was having difficulty walking up an incline  with near syncope. Denies any sore throat or sinus congestion. Does endorse "tight, sore" chest discomfort with some pleuritic component with deep inspiration. Denies any recent antibiotic exposure. She denied any nocturnal awakenings with cough or orthopnea. Patient was noted to desaturate with ambulation in the emergency department. Denied any subjective fever or sweats. Has endorsed some mild chills. Denied any abdominal pain or nausea but has had some diarrhea. Denied any other myalgias that are new. No new rashes. No new joint pain, swelling, or erythema. No sick contacts. No recent travel.  She was treated w/ working dx of volume overload in setting of decompensated diastolic dysfunction. Her symptoms improved w/ IV diuresis and she was cleared for d/c as of 11/27 with the plan as outlined below.     Discharge Plan by active problems   1. Acute hypoxic respiratory failure: resolved  2. Chest pain/pleurisy: resolved 3.  4. Right upper quadrant pain with diarrhea: Resolved  5. Cough: Suspect secondary to decompensated diastolic congestive heart failure. Resolved  6. Pulmonary hypertension: Continue plan for outpatient initiation of pulmonary arterial vasodilator therapy.   7. Diastolic congestive heart failure: Continuing aspirin 81 mg by mouth daily. Monitoring patient on telemetry continuously.  8. OSA: Mild by home polysomnogram.  Deferring initiation of inpatient CPAP therapy at this time.  9. Hypertension: resume home Maxide.   10. History of diabetes mellitus: resume home rx  11. Asthma: Continuing home Singulair.  12. 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   Significant Hospital tests/ studies  Consults   Discharge Exam: BP (!) 156/109 (BP Location: Left Arm)   Pulse 80   Temp 98.1 F (36.7 C) (Oral)   Resp 19   Ht 5' (1.524 m)   Wt 206 lb 6.4 oz (93.6 kg)   LMP 10/31/2002  SpO2 94%   BMI 40.31 kg/m   General:  Awake. Alert. No  acute distress. Obese.lying in bed Integument:  Dry, thickened, excoriated on back Lymphatics:  No appreciated cervical or supraclavicular lymphadenoapthy. HEENT:  Moist mucus membranes. No oral ulcers. No scleral injection or icterus.  Cardiovascular:  Regular rate. Bilateral lower extremity edema. No appreciable JVD given body habitus. Unable to appreciate any hepatojugular reflux. P2 loud Pulmonary:  Decreased breath sounds bilateral lung bases, bilateral crackles. Normal work of breathing on room air Speaking in complete sentences. Abdomen: Soft. Normal bowel sounds. Protuberant.  Musculoskeletal:  Normal bulk and tone. Neurological:  CN 2-12 grossly in tact. No meningismus. Moving all 4 extremities equally. Symmetric brachioradialis deep tendon reflexes. Psychiatric:  Mood and affect congruent. Speech normal rhythm, rate & tone.   Labs at discharge Lab Results  Component Value Date   CREATININE 0.93 07/09/2016   BUN 20 07/09/2016   NA 140 07/09/2016   K 5.0 07/09/2016   CL 106 07/09/2016   CO2 21 (L) 07/09/2016   Lab Results  Component Value Date   WBC 15.9 (H) 07/07/2016   HGB 12.5 07/07/2016   HCT 38.8 07/07/2016   MCV 86.4 07/07/2016   PLT 298 07/07/2016   Lab Results  Component Value Date   ALT 21 07/06/2016   AST 26 07/06/2016   ALKPHOS 59 07/06/2016   BILITOT 0.9 07/06/2016   Lab Results  Component Value Date   INR 1.1 03/20/2016    Current radiology studies No results found.  Disposition:  01-Home or Self Care  Discharge Instructions    (HEART FAILURE PATIENTS) Call MD:  Anytime you have any of the following symptoms: 1) 3 pound weight gain in 24 hours or 5 pounds in 1 week 2) shortness of breath, with or without a dry hacking cough 3) swelling in the hands, feet or stomach 4) if you have to sleep on extra pillows at night in order to breathe.    Complete by:  As directed    Diet - low sodium heart healthy    Complete by:  As directed    Increase  activity slowly    Complete by:  As directed        Medication List    STOP taking these medications   predniSONE 5 MG (21) Tbpk tablet Commonly known as:  STERAPRED UNI-PAK 21 TAB   promethazine-dextromethorphan 6.25-15 MG/5ML syrup Commonly known as:  PROMETHAZINE-DM   THERAFLU FLU/COLD/COUGH PO   triamterene-hydrochlorothiazide 37.5-25 MG tablet Commonly known as:  MAXZIDE-25     TAKE these medications   aspirin EC 81 MG tablet Take 1 tablet (81 mg total) by mouth daily.   benzonatate 100 MG capsule Commonly known as:  TESSALON PERLES Take 1 capsule (100 mg total) by mouth 2 (two) times daily as needed for cough.   ergocalciferol 50000 units capsule Commonly known as:  VITAMIN D2 Take 1 capsule (50,000 Units total) by mouth once a week.   furosemide 40 MG tablet Commonly known as:  LASIX Take 1 tablet (40 mg total) by mouth daily.   guaiFENesin-dextromethorphan 100-10 MG/5ML syrup Commonly known as:  ROBITUSSIN DM Take 5 mLs by mouth every 6 (six) hours as needed for cough.   hydroxychloroquine 200 MG tablet Commonly known as:  PLAQUENIL Take 200 mg by mouth 2 (two) times daily.   montelukast 10 MG tablet Commonly known as:  SINGULAIR Take 1 tablet (10 mg total) by mouth at bedtime. What changed:  when  to take this  reasons to take this   multivitamin with minerals Tabs tablet Take 1 tablet by mouth daily.   nitroGLYCERIN 0.4 MG SL tablet Commonly known as:  NITROSTAT Place 0.4 mg under the tongue every 5 (five) minutes as needed for chest pain (3 DOSES MAX).   ondansetron 4 MG disintegrating tablet Commonly known as:  ZOFRAN ODT One tablet daily, as needed , for nusea What changed:  how much to take  how to take this  when to take this  reasons to take this  additional instructions   pantoprazole 40 MG tablet Commonly known as:  PROTONIX Take 1 tablet (40 mg total) by mouth daily. What changed:  when to take this  reasons to take  this   potassium chloride SA 20 MEQ tablet Commonly known as:  K-DUR,KLOR-CON Take 1 tablet (20 mEq total) by mouth 2 (two) times daily.   triamcinolone ointment 0.1 % Commonly known as:  KENALOG APPLY TO ITCHING SPOTS ON THE BODY DAILY TO TWICE DAILY AS NEEDED. NOT TO FACE. What changed:  See the new instructions.        Discharged Condition: good  Physician Statement:   The Patient was personally examined, the discharge assessment and plan has been personally reviewed and I agree with ACNP Olena Willy's assessment and plan. > 30 minutes of time have been dedicated to discharge assessment, planning and discharge instructions.   Signed: Clementeen Graham 07/09/2016, 12:51 PM

## 2016-07-09 NOTE — Telephone Encounter (Signed)
Error not needed

## 2016-07-10 ENCOUNTER — Telehealth: Payer: Self-pay | Admitting: Family Medicine

## 2016-07-10 ENCOUNTER — Other Ambulatory Visit: Payer: Self-pay | Admitting: Family Medicine

## 2016-07-10 MED ORDER — PROMETHAZINE-DM 6.25-15 MG/5ML PO SYRP: ORAL_SOLUTION | ORAL | 0 refills | Status: DC

## 2016-07-10 NOTE — Telephone Encounter (Signed)
Phenergan dM sent in for bedtime use on;ly due to sedative s/e bUT she aBSOLUTELY needs to try to get in with pulmonary sooner , I am aware of her recent hospitalization , call back if needs additional help, let her know

## 2016-07-10 NOTE — Telephone Encounter (Signed)
Forms have been completed. We are still waiting on a PA decision.

## 2016-07-10 NOTE — Progress Notes (Signed)
Phenergan dm  

## 2016-07-10 NOTE — Telephone Encounter (Signed)
Sabrina [name] is stating that she was here 3 wks ago and given prednisone for cough, she still has a dry cough, Tessalon Pearls has not helped at all. Please advise?

## 2016-07-10 NOTE — Telephone Encounter (Signed)
Patient aware.

## 2016-07-10 NOTE — Telephone Encounter (Signed)
Please advise.   She was advised to contact Pulmonology on 11/22

## 2016-07-10 NOTE — Telephone Encounter (Signed)
Ria Comment please advise of when forms have been completed. thanks

## 2016-07-11 NOTE — Telephone Encounter (Signed)
Calling to see if they can get verbal Authorization on PA the # is [phone] PA dept.Hillery Hunter

## 2016-07-11 NOTE — Telephone Encounter (Signed)
Called and did the PA over the phone for the [place] and the Letairis.  They stated that we should hear back about these meds in about 4 days.  Will forward to lindsay to follow up on.

## 2016-07-12 ENCOUNTER — Ambulatory Visit (HOSPITAL_COMMUNITY)
Admission: RE | Admit: 2016-07-12 | Discharge: 2016-07-12 | Disposition: A | Discharge status: Home / Self Care | Payer: 59 | Source: Ambulatory Visit | Attending: Family Medicine | Admitting: Family Medicine

## 2016-07-12 DIAGNOSIS — Z1231 Encounter for screening mammogram for malignant neoplasm of breast: Secondary | ICD-10-CM | POA: Diagnosis not present

## 2016-07-13 NOTE — Telephone Encounter (Signed)
Bree wanting to know if we are going to do an appeal because these were both denied [phone]

## 2016-07-13 NOTE — Telephone Encounter (Signed)
Called Bree at [facility], who put me back over to PA line- R heart cath documenting [place] must be faxed to [phone] with the PA numbers on the cover sheet- Adcirca: [id number], Milda Smart [id number].   R heart cath has been sent to above number for both PA requests.  Will hold for follow-up

## 2016-07-17 NOTE — Telephone Encounter (Signed)
Attempted to call [facility]. Was on a long hold. Will call back.

## 2016-07-17 NOTE — Telephone Encounter (Signed)
Bree at [facility], states the Milda Smart is still being denied, contact prior authorization dept: # [id number].Mearl Latin

## 2016-07-19 NOTE — Telephone Encounter (Signed)
[phone] Valentino Nose are we going to appeal the [employer]

## 2016-07-19 NOTE — Telephone Encounter (Signed)
Received a fax today that Joanie Coddington has been approved through 07/13/2017.

## 2016-07-19 NOTE — Telephone Encounter (Addendum)
Called [facility] and spoke with Katie. Advised her that Adcirca had been approved and I was confused as to why Letairis isn't being approved. Joellen [place] states that we have to contact the pt's insurance company to start the appeal. Called [phone]. Spoke with Roselie Awkward. He states that Milda Smart was denied because we didn't supply them documentation of the pt's right heart cath in a timely manner. Advised him that this documentation was faxed to them twice, once with the initial PA form and secondly when they sent an additional fax saying that I didn't submit the right heart cath with the PA form. Roselie Awkward states that they don't have documentation of the first copy of the right heart cath that was sent so by the time the second copy was faxed, it was past the deadline for the medication to be approved. He states that we can start a new PA on Letairis due to the guidelines of the pt's insurance plan. A new PA was initiated for Letariris, a new form does not have to submitted as they have the form on file and copy of the right heart cath. Per Roselie Awkward he has marked this PA urgent and we should hear something back in 24 hours. Will await PA decision.

## 2016-07-20 NOTE — Telephone Encounter (Signed)
Called [employer] at [phone]. Sabrina [name] has been approved as of 07/19/2016. Tiffany at [facility] is aware of this information so that they have dispense this medication to the pt. She states that they will reach out to the pt about this. Nothing further was needed.

## 2016-07-27 ENCOUNTER — Ambulatory Visit (INDEPENDENT_AMBULATORY_CARE_PROVIDER_SITE_OTHER): Payer: 59 | Admitting: Emergency Medicine

## 2016-07-27 ENCOUNTER — Ambulatory Visit (INDEPENDENT_AMBULATORY_CARE_PROVIDER_SITE_OTHER): Payer: 59 | Admitting: Cardiology

## 2016-07-27 ENCOUNTER — Encounter: Payer: Self-pay | Admitting: Cardiology

## 2016-07-27 ENCOUNTER — Encounter: Payer: Self-pay | Admitting: Emergency Medicine

## 2016-07-27 VITALS — BP 124/82 | HR 80 | Ht 60.0 in | Wt 203.6 lb

## 2016-07-27 DIAGNOSIS — M359 Systemic involvement of connective tissue, unspecified: Secondary | ICD-10-CM

## 2016-07-27 DIAGNOSIS — I272 Pulmonary hypertension, unspecified: Secondary | ICD-10-CM

## 2016-07-27 DIAGNOSIS — R0600 Dyspnea, unspecified: Secondary | ICD-10-CM

## 2016-07-27 DIAGNOSIS — I4581 Long QT syndrome: Secondary | ICD-10-CM

## 2016-07-27 DIAGNOSIS — R9431 Abnormal electrocardiogram [ECG] [EKG]: Secondary | ICD-10-CM | POA: Diagnosis not present

## 2016-07-27 MED ORDER — NADOLOL 40 MG PO TABS: 40.0000 mg | ORAL_TABLET | Freq: Every day | ORAL | 3 refills | Status: DC

## 2016-07-27 MED ORDER — FUROSEMIDE 40 MG PO TABS: 40.0000 mg | ORAL_TABLET | Freq: Every day | ORAL | 11 refills | Status: DC

## 2016-07-27 NOTE — Assessment & Plan Note (Signed)
Being followed by Dr Denna Haggard. Has started Plaquianil. Labs to be followed by Dr Denna Haggard.

## 2016-07-27 NOTE — Assessment & Plan Note (Signed)
About to start timolol.

## 2016-07-27 NOTE — Addendum Note (Signed)
Addended by: Stanton Kidney on: 07/27/2016 10:42 AM   Modules accepted: Orders

## 2016-07-27 NOTE — Patient Instructions (Addendum)
Medication Instructions:    Your physician has recommended you make the following change in your medication:  1) START Nadolol 40 mg once daily  --- If you need a refill on your cardiac medications before your next appointment, please call your pharmacy. ---  Labwork:  None ordered  Testing/Procedures:  None ordered  Follow-Up:  Your physician recommends that you schedule a follow-up appointment in: 3 months with Dr. Curt Bears.   Thank you for choosing [facility]!!   Trinidad Curet, RN [phone]  Any Other Special Instructions Will Be Listed Below (If Applicable).  Nadolol tablets What is this medicine? NADOLOL (nay DOE lole) is a beta-blocker. Beta-blockers reduce the workload on the heart and help it to beat more regularly. This medicine is used to treat high blood pressure and to relieve chest pain caused by angina. This medicine may be used for other purposes; ask your health care provider or pharmacist if you have questions. COMMON BRAND NAME(S): Corgard What should I tell my health care provider before I take this medicine? They need to know if you have any of these conditions: -diabetes -heart or vessel disease like slow heart rate, worsening heart failure, heart block, sick sinus syndrome or Raynaud's disease -kidney disease -lung or breathing disease, like asthma or emphysema -pheochromocytoma -thyroid disease -an unusual or allergic reaction to nadolol, other beta-blockers, medicines, foods, dyes, or preservatives -pregnant or trying to get pregnant -breast-feeding How should I use this medicine? Take this medicine by mouth with a glass of water. Follow the directions on the prescription label. You can take this medicine with or without food. Take your doses at regular intervals. Do not take your medicine more often than directed. Do not stop taking this medicine suddenly. This could lead to serious heart-related effects. Talk to your pediatrician  regarding the use of this medicine in children. Special care may be needed. Overdosage: If you think you have taken too much of this medicine contact a poison control center or emergency room at once. NOTE: This medicine is only for you. Do not share this medicine with others. What if I miss a dose? If you miss a dose, take it as soon as you can. If it is almost time for your next dose, take only that dose. Do not take double or extra doses. What may interact with this medicine? This medicine may interact with the following medications: -certain medicines for blood pressure, heart disease, irregular heart beat -certain medicines for diabetes, like glipizide or glyburide -general anesthetics -medicines used to treat allergic reactions like epinephrine This list may not describe all possible interactions. Give your health care provider a list of all the medicines, herbs, non-prescription drugs, or dietary supplements you use. Also tell them if you smoke, drink alcohol, or use illegal drugs. Some items may interact with your medicine. What should I watch for while using this medicine? Visit your doctor or health care professional for regular check ups. Check your blood pressure and pulse rate regularly. Ask your health care professional what your blood pressure and pulse rate should be, and when you should contact them. You may get drowsy or dizzy. Do not drive, use machinery, or do anything that needs mental alertness until you know how this drug affects you. Do not stand or sit up quickly, especially if you are an older patient. This reduces the risk of dizzy or fainting spells. Alcohol can make you more drowsy and dizzy. Avoid alcoholic drinks. This medicine can affect blood sugar  levels. If you have diabetes, check with your doctor or health care professional before you change your diet or the dose of your diabetic medicine. Do not treat yourself for coughs, colds, or pain while you are taking this  medicine without asking your doctor or health care professional for advice. Some ingredients may increase your blood pressure. What side effects may I notice from receiving this medicine? Side effects that you should report to your doctor or health care professional as soon as possible: -allergic reactions like skin rash, itching or hives -cold, tingling, or numb hands or feet -difficulty breathing, wheezing -irregular heart beat -mental depression -slow heart rate -swelling of the legs or ankles Side effects that usually do not require medical attention (report to your doctor or health care professional if they continue or are bothersome): -change in sex drive or performance -dry itching skin -headache This list may not describe all possible side effects. Call your doctor for medical advice about side effects. You may report side effects to FDA at 1-800-FDA-1088. Where should I keep my medicine? Keep out of the reach of children. Store at room temperature between 15 and 30 degrees C (59 and 86 degrees F). Protect from light. Keep container tightly closed. Throw away any unused medicine after the expiration date. NOTE: This sheet is a summary. It may not cover all possible information. If you have questions about this medicine, talk to your doctor, pharmacist, or health care provider.  2017 Elsevier/Gold Standard (2013-04-03 14:45:05)

## 2016-07-27 NOTE — Assessment & Plan Note (Addendum)
Significant for hypertension, likely multifactorial but I suspect that the most significant contributor is her connective tissue disease. Today showed decreased functional capacity at 291 m but no desaturation. I would like to get her back on medical therapy as soon as possible. She had side effects when she started [place] simultaneously. Headache and diarrhea were likely due to the Adcirca. I will restart Letairis now. If well-tolerated then we will start Adcirca at half dose, titrate up as we are able. Follow serial echocardiogram and 6 minute walk after she has been on therapy 6-9 months.

## 2016-07-27 NOTE — Progress Notes (Signed)
Electrophysiology Office Note   Date:  07/27/2016   ID:  Sabrina [name], Sabrina [name] 1961-05-13, MRN [id number]  PCP:  [name], MD  Cardiologist:  Radford Pax Primary Electrophysiologist:  Keesha Pellum Meredith Leeds, MD    Chief Complaint  Patient presents with  . CONSULT    Prolong QT     History of Present Illness: Sabrina [name] is a 55 y.o. female who presents today for electrophysiology evaluation.   Hx of morbid obesity, HTN, HL, GERD, DM2. She had a recent hospitalization for acute hypoxic respiratoyr failure, pulmonary hypertension, diastolic HF. 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 She started the medications last weekend, but had significant GI symptoms as well as headaches.  She presents today for workup of her long QT. Her QTC is 523 ms today. In speaking with her, she has not had any family members that have died suddenly or had issues with passing out. She does not currently have children and has no plans of having children. She has not had episodes of syncope or palpitations. She does say that she had a near syncopal event last summer when she was walking up a hill and got very short of breath. She said subsequently that she was diagnosed with pulmonary hypertension. Otherwise she has not had any presyncopal events.   Today, she denies symptoms of palpitations, chest pain, shortness of breath, orthopnea, PND, lower extremity edema, claudication, dizziness, presyncope, syncope, bleeding, or neurologic sequela. The patient is tolerating medications without difficulties and is otherwise without complaint today.    Past Medical History:  Diagnosis Date  . Allergy   . Anemia   . Arthritis   . Asthma   . Chronic bronchitis ([place])   . Diabetes mellitus   . Endometriosis   . Fibroid uterus   . GERD (gastroesophageal reflux disease)   . History of Doppler ultrasound    a. Carotid US  8/08:Estimated stenosis in the right and left internal carotid arteries is 0-50% and 0-50%.  . History of echocardiogram    a. Echo 6/16: EF 60-65%, no RWMA.  . History of hiatal hernia   . History of nuclear stress test    a. Myoview 6/16: There is no resting or stress perfusion defect consistent with no prior infarct and no ischemia. EF 73 %  The patient was hypertensive prior and during the study. Cardiac cath 04/2016 showed normal coronary arteries  . Hx of iron deficiency anemia   . Hypersomnia with sleep apnea   . Hypertension   . 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 Menorrhagia   . Obesity   . Pneumonia   . 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 Secondary hyperthyroidism   . Thyroid disease   . Tinnitus    left ear  . Vertigo   . Vitamin D deficiency    Past Surgical History:  Procedure Laterality Date  . ABDOMINAL HYSTERECTOMY  2004   supracervical hysterectomy  . BREAST SURGERY  2005   reduction  . BREATH TEK H PYLORI [name] 12/24/2014   Procedure: BREATH TEK H PYLORI;  Surgeon: Greer Pickerel, MD;  Location: [facility];  Service: General;  Laterality: [name];  . CARDIAC CATHETERIZATION [name] 03/22/2016   Procedure: Right/Left Heart Cath and Coronary Angiography;  Surgeon: Troy Sine, MD;  Location: [facility];  Service: Cardiovascular;  Laterality: [name];  . DILATION AND CURETTAGE OF UTERUS  age 83  . ESOPHAGOGASTRODUODENOSCOPY (EGD) WITH PROPOFOL [name] 10/14/2015   Procedure: ESOPHAGOGASTRODUODENOSCOPY (EGD) WITH PROPOFOL;  Surgeon: Clarene Essex, MD;  Location: [facility];  Service: Endoscopy;  Laterality: [name];  . LAPAROSCOPIC GASTRIC SLEEVE RESECTION WITH HIATAL HERNIA REPAIR [name] 07/05/2015   Procedure: LAPAROSCOPIC GASTRIC SLEEVE RESECTION WITH HIATAL HERNIA REPAIR, upper endoscopy;  Surgeon: Greer Pickerel, MD;  Location: [facility];  Service: General;  Laterality: [name];     Current Outpatient Prescriptions  Medication Sig Dispense Refill  . aspirin EC 81 MG tablet Take 1 tablet (81  mg total) by mouth daily.    . benzonatate (TESSALON PERLES) 100 MG capsule Take 1 capsule (100 mg total) by mouth 2 (two) times daily as needed for cough. 20 capsule 0  . ergocalciferol (VITAMIN D2) 50000 units capsule Take 1 capsule (50,000 Units total) by mouth once a week. 4 capsule 3  . furosemide (LASIX) 40 MG tablet Take 1 tablet (40 mg total) by mouth daily. 30 tablet 11  . guaiFENesin-dextromethorphan (ROBITUSSIN DM) 100-10 MG/5ML syrup Take 5 mLs by mouth every 6 (six) hours as needed for cough. 118 mL 0  . hydroxychloroquine (PLAQUENIL) 200 MG tablet Take 200 mg by mouth 2 (two) times daily.    . montelukast (SINGULAIR) 10 MG tablet Take 1 tablet (10 mg total) by mouth at bedtime. (Patient taking differently: Take 10 mg by mouth at bedtime as needed (allergies). ) 30 tablet 3  . Multiple Vitamin (MULTIVITAMIN WITH MINERALS) TABS tablet Take 1 tablet by mouth daily.    . nitroGLYCERIN (NITROSTAT) 0.4 MG SL tablet Place 0.4 mg under the tongue every 5 (five) minutes as needed for chest pain (3 DOSES MAX).    [name] ondansetron (ZOFRAN ODT) 4 MG disintegrating tablet One tablet daily, as needed , for nusea (Patient taking differently: Take 4 mg by mouth every 6 (six) hours as needed for nausea or vomiting. One tablet daily, as needed , for nusea) 30 tablet 2  . pantoprazole (PROTONIX) 40 MG tablet Take 1 tablet (40 mg total) by mouth daily. 30 tablet 6  . potassium chloride SA (K-DUR,KLOR-CON) 20 MEQ tablet Take 1 tablet (20 mEq total) by mouth 2 (two) times daily. 60 tablet 6  . promethazine-dextromethorphan (PROMETHAZINE-DM) 6.25-15 MG/5ML syrup One teaspoon at bedtime as needed, for excessive cough 240 mL 0   No current facility-administered medications for this visit.     Allergies:   Peanut-containing drug products; Potassium iodide; Ace inhibitors; Codeine; Doxycycline; Amlodipine; Latex; and Penicillins   Social History:  The patient  reports that she has never smoked. She has never used  smokeless tobacco. She reports that she does not drink alcohol or use drugs.   Family History:  The patient's family history includes Arthritis in her father, mother, and sister; Bleeding Disorder in her sister; Breast cancer in her sister; CAD in her mother; Cancer in her father; Colon cancer in her maternal grandfather; Depression in her father, mother, and sister; Diabetes in her father and mother; Hypertension in her father, mother, and sister; Stroke in her father and maternal aunt; Thyroid disease in her sister.    ROS:  Please see the history of present illness.   Otherwise, review of systems is positive for chest pressure, cough, nausea, headaches.   All other systems are reviewed and negative.    PHYSICAL EXAM: VS:  BP 124/82   Pulse 80   Ht 5' (1.524 m)   Wt 203 lb 9.6 oz (92.4 kg)   LMP  10/31/2002   BMI 39.76 kg/m  , BMI Body mass index is 39.76 kg/m. GEN: Well nourished, well developed, in no acute distress  HEENT: normal  Neck: no JVD, carotid bruits, or masses Cardiac: RRR; no murmurs, rubs, or gallops,no edema  Respiratory:  clear to auscultation bilaterally, normal work of breathing GI: soft, nontender, nondistended, + BS MS: no deformity or atrophy  Skin: warm and dry Neuro:  Strength and sensation are intact Psych: euthymic mood, full affect  EKG:  EKG is ordered today. Personal review of the ekg ordered shows sinus rhythm, rate 80, RVH, prolonged QTc 523  Recent Labs: 07/06/2016: ALT 21; B Natriuretic Peptide 740.1 07/07/2016: Hemoglobin 12.5; Magnesium 1.9; Platelets 298 07/09/2016: BUN 20; Creatinine, Ser 0.93; Potassium 5.0; Sodium 140    Lipid Panel     Component Value Date/Time   CHOL 111 (L) 03/10/2016 1038   TRIG 83 03/10/2016 1038   HDL 32 (L) 03/10/2016 1038   CHOLHDL 3.5 03/10/2016 1038   VLDL 17 03/10/2016 1038   LDLCALC 62 03/10/2016 1038     Wt Readings from Last 3 Encounters:  07/27/16 203 lb 9.6 oz (92.4 kg)  07/08/16 206 lb 6.4 oz  (93.6 kg)  06/22/16 208 lb (94.3 kg)      Other studies Reviewed: Additional studies/ records that were reviewed today include: TTE 03/22/16, Cath 03/22/16  Review of the above records today demonstrates:  - Left ventricle: The cavity size was normal. Systolic function was   normal. The estimated ejection fraction was in the range of 50%   to 55%. Wall motion was normal; there were no regional wall   motion abnormalities. Doppler parameters are consistent with   abnormal left ventricular relaxation (grade 1 diastolic   dysfunction). Doppler parameters are consistent with high   ventricular filling pressure. - Ventricular septum: The contour showed diastolic flattening and   systolic flattening consistent with right ventricular pressure   and volume overload. - Aortic valve: Transvalvular velocity was within the normal range.   There was no stenosis. There was no regurgitation. - Mitral valve: There was trivial regurgitation. - Right ventricle: The cavity size was normal. Wall thickness was   normal. Systolic function was reduced. - Tricuspid valve: There was mild regurgitation. - Pulmonary arteries: Systolic pressure was severely increased. PA   peak pressure: 91 mm Hg (S). - Pericardium, extracardiac: A trivial pericardial effusion was   identified.   The left ventricular systolic function is normal.  The left ventricular ejection fraction is 55-65% by visual estimate.  Hemodynamic findings consistent with severe pulmonary hypertension.   Severe right ventricular systolic pressure and pulmonary hypertension with systolic pressures greater than 100 mmHg.  Significant pulmonary capillary/LV gradient, without previous evidence of mitral stenosis on echocardiography.  Normal coronary arteries.  Normal LV systolic function with left ventricular hypertrophy.   ASSESSMENT AND PLAN:  1. Acute CHF - Echo in 2016 with normal LVEF.  Currently appears to be well compensated. Is  not having any further issues with shortness of breath at the moment.  2. Chest pain  - No evidence of coronary artery disease.  3. HTN - Well controlled today  4. Prolonged QT - Avoid QT prolonging drugs.  BMET, Mg2+ today. She does not have any symptoms of syncope, and her near syncope sounded to be more respiratory in nature. She does not have a family history of syncope or sudden death. She also does not have any children and with no plans of having children.  I have discussed with her that her sibling should likely have an EKG to look for long QT. We Jillian Warth start her today on timolol 40 mg to see if this improves her QTC and thus could potentially prevent arrhythmias.  5. Pulmonary hypertension: Follow-up planned with pulmonary.  Current medicines are reviewed at length with the patient today.   The patient does not have concerns regarding her medicines.  The following changes were made today:  nadolol  Labs/ tests ordered today include:  No orders of the defined types were placed in this encounter.    Disposition:   FU with Skylur Fuston 3 months  Signed, Kendrix Orman Meredith Leeds, MD  07/27/2016 10:18 AM     [facility] [address] [place] [address] [phone] (office) [phone] (fax)

## 2016-07-27 NOTE — Progress Notes (Signed)
Subjective:    Patient ID: Sabrina [name], female    DOB: 1961/01/19, 55 y.o.   MRN: [id number]  HPI [name] is a 55 year old never smoker with a history of diastolic CHF, systemic hypertension, diabetes, obesity with ?? obstructive sleep apnea. She had a sleep study at [facility] before her gastric sgy. With a history of possible Lupus followed at [facility] and locally by Dr Tonia Brooms. She was seen by Rheum and it sounds like she was serology negative. Underwent gastric sleeve for wt loss (2016) by Dr Redmond Pulling, well tolerated. She took Phen-Fen for about 6 months, about 2 years. Former MTX use  She has been under evaluation for her CHF and is followed at [facility] heart care. She has been on lasix for the last month or so, feels that her breathing is better.  She underwent left and right cardiac catheterization 03/22/16 as detailed below, shows significant elevation PA pressures. No MV or TV disease noted.   ROV 06/22/16 -- This follow-up visit for evaluation of severe pulmonary hypertension. She has a history as outlined above that includes diastolic CHF, suspected obstructive sleep apnea, possible connective tissue disease, and diet pill use. We have performed testing since last visit. She has had a low probability ventilation/perfusion scan 06/04/16, and normal chest x-ray. She has a positive ANA from 05/25/16 with a titer of 1:1280, centromeric pattern. Rheumatoid factor elevated at 36. Home sleep study is pending. Walking oximetry last time was normal on RA.   She has been having dry cough, may be due to increased allergy sx. She does not feel exertional SOB, is able to exert. She takes stairs slower but is able to climb. She has seen Dr Lavonna Monarch and was just started on hydroxychloriquine this week, for skin symptoms.    ROV 07/27/16 -- This is a follow-up visit for severe pulmonary hypertension in the setting of diastolic CHF, newly identified connective tissue disease. She also has a history of  diet pill use in the remote past. No evidence to support chronic thromboembolic disease. Since our last visit she has been started on [place]. 6 minute walk was performed today. She was able to ambulate 291 m without any evidence of desaturation.  She had significant side effects when she started both > diarrhea, severe HA. She stopped both on 12/12. She is now on Plaquenil, blood work to be followed by Dr Denna Haggard. She is about to start timolol for prolonged QT-c per cardiology plans.    Echo 03/22/16 EF 50-55%, normal wall motion, grade 1 diastolic dysfunction, diastolic and systolic flattening of ventricular septum consistent with RV pressure and volume overload, trivial MR, reduced RVSF, mild TR, PASP 91 mmHg, trivial pericardial effusion; severe pulmonary hypertension  L/RHC 03/22/16  The left ventricular systolic function is normal.  The left ventricular ejection fraction is 55-65% by visual estimate.  Hemodynamic findings consistent with severe pulmonary hypertension.  PASP 114 mmHg Severe right ventricular systolic pressure and pulmonary hypertension with systolic pressures greater than 100 mmHg. Significant pulmonary capillary/LV gradient, without previous evidence of mitral stenosis on echocardiography. Normal coronary arteries. Normal LV systolic function with left ventricular hypertrophy. RECOMMENDATION: The patient will undergo 2-D echo Doppler study later today prior to being discharged. The etiology of her severe pulmonary hypertension is unknown and consider possible primary pulmonary hypertension. Echo Doppler study will be helpful to further assess her mitral valve and potential for RV volume/pressure overload  Review of Systems  Constitutional: Negative.  Negative for fever  and unexpected weight change.  HENT: Positive for sinus pressure and sneezing. Negative for congestion, dental problem, ear pain, nosebleeds, postnasal drip, rhinorrhea, sore throat and  trouble swallowing.   Eyes: Positive for itching. Negative for redness.  Respiratory: Positive for cough, shortness of breath and wheezing. Negative for chest tightness.   Cardiovascular: Negative.  Negative for palpitations and leg swelling.  Gastrointestinal: Negative.  Negative for nausea and vomiting.  Endocrine: Negative.   Genitourinary: Negative.  Negative for dysuria.  Musculoskeletal: Negative.  Negative for joint swelling.  Skin: Negative.  Negative for rash.  Allergic/Immunologic: Positive for environmental allergies.  Neurological: Negative.  Negative for headaches.  Hematological: Bruises/bleeds easily.  Psychiatric/Behavioral: Negative.  Negative for dysphoric mood. The patient is not nervous/anxious.     Past Medical History:  Diagnosis Date  . Allergy   . Anemia   . Arthritis   . Asthma   . Chronic bronchitis ([place])   . Diabetes mellitus   . Endometriosis   . Fibroid uterus   . GERD (gastroesophageal reflux disease)   . History of Doppler ultrasound    a. Carotid US 8/08:Estimated stenosis in the right and left internal carotid arteries is 0-50% and 0-50%.  . History of echocardiogram    a. Echo 6/16: EF 60-65%, no RWMA.  . History of hiatal hernia   . History of nuclear stress test    a. Myoview 6/16: There is no resting or stress perfusion defect consistent with no prior infarct and no ischemia. EF 73 %  The patient was hypertensive prior and during the study. Cardiac cath 04/2016 showed normal coronary arteries  . Hx of iron deficiency anemia   . Hypersomnia with sleep apnea   . Hypertension   . 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 Menorrhagia   . Obesity   . Pneumonia   . 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 Secondary hyperthyroidism   . Thyroid disease   . Tinnitus    left ear  . Vertigo   . Vitamin D deficiency      Family History  Problem Relation Age of Onset  . Hypertension Mother   . Diabetes Mother   . Arthritis Mother   . Depression Mother   .  CAD Mother     s/p PCI  . Diabetes Father   . Stroke Father   . Arthritis Father   . Depression Father   . Hypertension Father   . Cancer Father     prostate  . Hypertension Sister     x 2  . Breast cancer Sister   . Arthritis Sister   . Bleeding Disorder Sister   . Depression Sister   . Thyroid disease Sister   . Colon cancer Maternal Grandfather   . Stroke Maternal Aunt   . Heart attack Neg Hx     Works as a Engineer, production, desk job.   Social History   Social History  . Marital status: Married    Spouse name: [name]  . Number of children: [name]  . Years of education: 48   Occupational History  . 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 Smoking status: Never Smoker  . Smokeless tobacco: Never Used  . Alcohol use No  . Drug use: No  . Sexual activity: Yes    Partners: Male    Birth control/ protection: Surgical     Comment: hysterectomy   Other Topics Concern  . Not on file  Social History Narrative   Regular exercise-no   Caffeine Use-no      [facility]: Patient lives with her husband. Works for [employer]. No bird or mold exposure. No pets currently.              Allergies  Allergen Reactions  . Peanut-Containing Drug Products Nausea And Vomiting  . Potassium Iodide Other (See Comments)    facial swelling, also eyes, and ears  . Ace Inhibitors Cough  . Codeine Nausea And Vomiting  . Doxycycline Hives  . Amlodipine Rash  . Latex Hives and Rash  . Penicillins Hives, Itching and Rash    .Marland KitchenHas patient had a PCN reaction causing immediate rash, facial/tongue/throat swelling, SOB or lightheadedness with hypotension: No Has patient had a PCN reaction causing severe rash involving mucus membranes or skin necrosis: No Has patient had a PCN reaction that required hospitalization No Has patient had a PCN reaction occurring within the last 10 years: YES (3 years ago)  If all of the above answers are  "NO", then may proceed with Cephalosporin use.      Outpatient Medications Prior to Visit  Medication Sig Dispense Refill  . aspirin EC 81 MG tablet Take 1 tablet (81 mg total) by mouth daily.    . benzonatate (TESSALON PERLES) 100 MG capsule Take 1 capsule (100 mg total) by mouth 2 (two) times daily as needed for cough. 20 capsule 0  . ergocalciferol (VITAMIN D2) 50000 units capsule Take 1 capsule (50,000 Units total) by mouth once a week. 4 capsule 3  . furosemide (LASIX) 40 MG tablet Take 1 tablet (40 mg total) by mouth daily. 30 tablet 11  . guaiFENesin-dextromethorphan (ROBITUSSIN DM) 100-10 MG/5ML syrup Take 5 mLs by mouth every 6 (six) hours as needed for cough. 118 mL 0  . hydroxychloroquine (PLAQUENIL) 200 MG tablet Take 200 mg by mouth 2 (two) times daily.    . montelukast (SINGULAIR) 10 MG tablet Take 1 tablet (10 mg total) by mouth at bedtime. (Patient taking differently: Take 10 mg by mouth at bedtime as needed (allergies). ) 30 tablet 3  . Multiple Vitamin (MULTIVITAMIN WITH MINERALS) TABS tablet Take 1 tablet by mouth daily.    . nadolol (CORGARD) 40 MG tablet Take 1 tablet (40 mg total) by mouth daily. 30 tablet 3  . nitroGLYCERIN (NITROSTAT) 0.4 MG SL tablet Place 0.4 mg under the tongue every 5 (five) minutes as needed for chest pain (3 DOSES MAX).    [name] ondansetron (ZOFRAN ODT) 4 MG disintegrating tablet One tablet daily, as needed , for nusea (Patient taking differently: Take 4 mg by mouth every 6 (six) hours as needed for nausea or vomiting. One tablet daily, as needed , for nusea) 30 tablet 2  . pantoprazole (PROTONIX) 40 MG tablet Take 1 tablet (40 mg total) by mouth daily. 30 tablet 6  . potassium chloride SA (K-DUR,KLOR-CON) 20 MEQ tablet Take 1 tablet (20 mEq total) by mouth 2 (two) times daily. 60 tablet 6  . promethazine-dextromethorphan (PROMETHAZINE-DM) 6.25-15 MG/5ML syrup One teaspoon at bedtime as needed, for excessive cough 240 mL 0   No facility-administered  medications prior to visit.         Objective:   Physical Exam Vitals:   07/27/16 1148  BP: 112/72  Pulse: 84  SpO2: 97%  Weight: 203 lb (92.1 kg)  Height: 5' (1.524 m)   Gen: Pleasant, overwt woman, in no distress,  normal affect  ENT: No lesions,  mouth clear,  oropharynx clear, no postnasal drip  Neck: No JVD, no TMG, no carotid bruits  Lungs: No use of accessory muscles, clear without rales or rhonchi  Cardiovascular: RRR, heart sounds normal, no murmur or gallops, no peripheral edema  Musculoskeletal: No deformities, no cyanosis or clubbing  Neuro: alert, non focal  Skin: Warm, no lesions or rashes      Assessment & Plan:  Pulmonary hypertension Significant for hypertension, likely multifactorial but I suspect that the most significant contributor is her connective tissue disease. Today showed decreased functional capacity at 291 m but no desaturation. I would like to get her back on medical therapy as soon as possible. She had side effects when she started [place] simultaneously. Headache and diarrhea were likely due to the Adcirca. I will restart [place] now. If well-tolerated then we will start Adcirca at half dose, titrate up as we are able. Follow serial echocardiogram and 6 minute walk after she has been on therapy 6-9 months.   Prolonged QT syndrome About to start timolol.   Connective tissue disease, undifferentiated ([place]) Being followed by Dr Denna Haggard. Has started Plaquianil. Labs to be followed by Dr Denna Haggard.   Baltazar Apo, MD, PhD 07/27/2016, 12:25 PM [facility] [phone] or if no answer [phone]

## 2016-07-27 NOTE — Patient Instructions (Addendum)
Restart Letaris 5mg  daily now.  If Myrtis Hopping is well tolerated after 10-14 days, then start Adcirca 20mg  daily.  Call us with any side effects.  Follow with Dr Lamonte Sakai in 6 weeks or sooner if you have any problems

## 2016-07-30 ENCOUNTER — Observation Stay (HOSPITAL_COMMUNITY)
Admission: EM | Admit: 2016-07-30 | Discharge: 2016-08-01 | Disposition: A | Discharge status: Home / Self Care | Payer: 59 | Attending: Family Medicine | Admitting: Family Medicine

## 2016-07-30 ENCOUNTER — Encounter (HOSPITAL_COMMUNITY): Payer: Self-pay

## 2016-07-30 ENCOUNTER — Emergency Department (HOSPITAL_COMMUNITY): Payer: 59

## 2016-07-30 DIAGNOSIS — Z7982 Long term (current) use of aspirin: Secondary | ICD-10-CM | POA: Diagnosis not present

## 2016-07-30 DIAGNOSIS — J45909 Unspecified asthma, uncomplicated: Secondary | ICD-10-CM | POA: Diagnosis not present

## 2016-07-30 DIAGNOSIS — I5032 Chronic diastolic (congestive) heart failure: Secondary | ICD-10-CM | POA: Insufficient documentation

## 2016-07-30 DIAGNOSIS — I272 Pulmonary hypertension, unspecified: Secondary | ICD-10-CM | POA: Diagnosis not present

## 2016-07-30 DIAGNOSIS — E119 Type 2 diabetes mellitus without complications: Secondary | ICD-10-CM | POA: Insufficient documentation

## 2016-07-30 DIAGNOSIS — E1169 Type 2 diabetes mellitus with other specified complication: Secondary | ICD-10-CM | POA: Diagnosis present

## 2016-07-30 DIAGNOSIS — Z79899 Other long term (current) drug therapy: Secondary | ICD-10-CM | POA: Insufficient documentation

## 2016-07-30 DIAGNOSIS — I4581 Long QT syndrome: Secondary | ICD-10-CM | POA: Diagnosis not present

## 2016-07-30 DIAGNOSIS — I110 Hypertensive heart disease with heart failure: Secondary | ICD-10-CM | POA: Insufficient documentation

## 2016-07-30 DIAGNOSIS — E669 Obesity, unspecified: Secondary | ICD-10-CM | POA: Diagnosis present

## 2016-07-30 DIAGNOSIS — E876 Hypokalemia: Secondary | ICD-10-CM | POA: Diagnosis present

## 2016-07-30 DIAGNOSIS — R55 Syncope and collapse: Secondary | ICD-10-CM | POA: Diagnosis present

## 2016-07-30 LAB — COMPREHENSIVE METABOLIC PANEL
ALBUMIN: 3.3 g/dL — AB (ref 3.5–5.0)
ALK PHOS: 61 U/L (ref 38–126)
ALT: 28 U/L (ref 14–54)
AST: 61 U/L — AB (ref 15–41)
Anion gap: 13 (ref 5–15)
BILIRUBIN TOTAL: 0.7 mg/dL (ref 0.3–1.2)
BUN: 18 mg/dL (ref 6–20)
CALCIUM: 7.2 mg/dL — AB (ref 8.9–10.3)
CO2: 24 mmol/L (ref 22–32)
Chloride: 103 mmol/L (ref 101–111)
Creatinine, Ser: 1.08 mg/dL — ABNORMAL HIGH (ref 0.44–1.00)
GFR calc Af Amer: >60 mL/min (ref >60)
GFR, EST NON AFRICAN AMERICAN: 58 mL/min — AB (ref >60)
GLUCOSE: 148 mg/dL — AB (ref 65–99)
Potassium: 3 mmol/L — ABNORMAL LOW (ref 3.5–5.1)
Sodium: 140 mmol/L (ref 135–145)
TOTAL PROTEIN: 7.6 g/dL (ref 6.5–8.1)

## 2016-07-30 LAB — BRAIN NATRIURETIC PEPTIDE: B Natriuretic Peptide: 355 pg/mL — ABNORMAL HIGH (ref 0.0–100.0)

## 2016-07-30 LAB — CBC WITH DIFFERENTIAL/PLATELET
BASOS ABS: 0 10*3/uL (ref 0.0–0.1)
BASOS PCT: 0 %
Eosinophils Absolute: 0.8 10*3/uL — ABNORMAL HIGH (ref 0.0–0.7)
Eosinophils Relative: 6 %
HEMATOCRIT: 39.9 % (ref 36.0–46.0)
HEMOGLOBIN: 12.7 g/dL (ref 12.0–15.0)
LYMPHS PCT: 23 %
Lymphs Abs: 3.2 10*3/uL (ref 0.7–4.0)
MCH: 28.1 pg (ref 26.0–34.0)
MCHC: 31.8 g/dL (ref 30.0–36.0)
MCV: 88.3 fL (ref 78.0–100.0)
MONO ABS: 1 10*3/uL (ref 0.1–1.0)
MONOS PCT: 7 %
NEUTROS ABS: 9.2 10*3/uL — AB (ref 1.7–7.7)
NEUTROS PCT: 64 %
Platelets: 318 10*3/uL (ref 150–400)
RBC: 4.52 MIL/uL (ref 3.87–5.11)
RDW: 15.1 % (ref 11.5–15.5)
WBC: 14.2 10*3/uL — ABNORMAL HIGH (ref 4.0–10.5)

## 2016-07-30 LAB — URINALYSIS, ROUTINE W REFLEX MICROSCOPIC
BILIRUBIN URINE: NEGATIVE
GLUCOSE, UA: NEGATIVE mg/dL
HGB URINE DIPSTICK: NEGATIVE
Ketones, ur: NEGATIVE mg/dL
Leukocytes, UA: NEGATIVE
Nitrite: NEGATIVE
Protein, ur: 100 mg/dL — AB
Specific Gravity, Urine: 1.02 (ref 1.005–1.030)
pH: 7 (ref 5.0–8.0)

## 2016-07-30 LAB — MAGNESIUM: Magnesium: 2.2 mg/dL (ref 1.7–2.4)

## 2016-07-30 LAB — LACTIC ACID, PLASMA: LACTIC ACID, VENOUS: 0.6 mmol/L (ref 0.5–1.9)

## 2016-07-30 LAB — I-STAT TROPONIN, ED: TROPONIN I, POC: 0.01 ng/mL (ref 0.00–0.08)

## 2016-07-30 LAB — URINALYSIS, MICROSCOPIC (REFLEX)

## 2016-07-30 LAB — TROPONIN I

## 2016-07-30 MED ORDER — SODIUM CHLORIDE 0.9 % IV SOLN
INTRAVENOUS | Status: DC
  Administered 2016-07-30 – 2016-07-31 (×2): via INTRAVENOUS

## 2016-07-30 MED ORDER — SODIUM CHLORIDE 0.9 % IV BOLUS (SEPSIS)
1000.0000 mL | Freq: Once | INTRAVENOUS | Status: AC
  Administered 2016-07-30: 1000 mL via INTRAVENOUS

## 2016-07-30 MED ORDER — SODIUM CHLORIDE 0.9 % IV BOLUS (SEPSIS): 1000.0000 mL | Freq: Once | INTRAVENOUS | Status: DC

## 2016-07-30 MED ORDER — ASPIRIN EC 81 MG PO TBEC
81.0000 mg | DELAYED_RELEASE_TABLET | Freq: Every day | ORAL | Status: DC
  Administered 2016-07-31 – 2016-08-01 (×2): 81 mg via ORAL
  Filled 2016-07-30 (×2): qty 1

## 2016-07-30 MED ORDER — PROMETHAZINE HCL 25 MG/ML IJ SOLN: 12.5000 mg | Freq: Four times a day (QID) | INTRAMUSCULAR | Status: DC | PRN

## 2016-07-30 MED ORDER — POTASSIUM CHLORIDE 10 MEQ/100ML IV SOLN
10.0000 meq | INTRAVENOUS | Status: AC
  Administered 2016-07-30 – 2016-07-31 (×3): 10 meq via INTRAVENOUS
  Filled 2016-07-30 (×3): qty 100

## 2016-07-30 MED ORDER — ACETAMINOPHEN 500 MG PO TABS: 500.0000 mg | ORAL_TABLET | Freq: Four times a day (QID) | ORAL | Status: DC | PRN

## 2016-07-30 MED ORDER — POTASSIUM CHLORIDE CRYS ER 20 MEQ PO TBCR
40.0000 meq | EXTENDED_RELEASE_TABLET | Freq: Every day | ORAL | Status: DC
  Administered 2016-07-30: 40 meq via ORAL
  Filled 2016-07-30: qty 2

## 2016-07-30 MED ORDER — MONTELUKAST SODIUM 10 MG PO TABS
10.0000 mg | ORAL_TABLET | Freq: Every day | ORAL | Status: DC
  Administered 2016-07-30 – 2016-07-31 (×2): 10 mg via ORAL
  Filled 2016-07-30 (×3): qty 1

## 2016-07-30 MED ORDER — POTASSIUM CHLORIDE CRYS ER 20 MEQ PO TBCR
40.0000 meq | EXTENDED_RELEASE_TABLET | Freq: Once | ORAL | Status: AC
  Administered 2016-07-30: 40 meq via ORAL
  Filled 2016-07-30: qty 2

## 2016-07-30 MED ORDER — LORAZEPAM 1 MG PO TABS: 1.0000 mg | ORAL_TABLET | Freq: Four times a day (QID) | ORAL | Status: DC | PRN

## 2016-07-30 MED ORDER — ENOXAPARIN SODIUM 40 MG/0.4ML ~~LOC~~ SOLN
40.0000 mg | SUBCUTANEOUS | Status: DC
  Administered 2016-07-30 – 2016-07-31 (×2): 40 mg via SUBCUTANEOUS
  Filled 2016-07-30 (×2): qty 0.4

## 2016-07-30 NOTE — ED Notes (Addendum)
While writer was discharging patient, patient was sitting in a chair and stated she felt weak all over. Patient began diaphoretic and eyes rolled back and patient vomited. Patient also urinated on herself.  Pt was sitting with nurses assistance while vomiting. Approx. 10 seconds later patient regained conscious. Patient was then able to get into stretcher with assistance. Patient placed in 2lpm of oxygen [place] and placed on monitor. Dr. Roderic Palau made aware. Verbal orders given to Probation officer.

## 2016-07-30 NOTE — ED Notes (Signed)
X-ray at bedside

## 2016-07-30 NOTE — ED Triage Notes (Signed)
Pt reports she was admitted to the hospital for chest pain, bronchitis, and problems with blood flow to her lungs back around Thanksgiving and was discharged the following Monday.  Today pt was at work and had a syncopal episode.  Pt presently denies any pain but reports fatigue.  EMS reports cbg 134.  Pt says she recently started a new medication but hasn't been taking it because she was having headaches while on it.

## 2016-07-30 NOTE — ED Notes (Signed)
Husband at bedside.  

## 2016-07-30 NOTE — ED Notes (Signed)
2 unsuccessful attempts made to obtain IV.

## 2016-07-30 NOTE — ED Provider Notes (Addendum)
[facility] DEPT Provider Note   CSN: [id number] Arrival date & time: 07/30/16  1212  By signing my name below, I, Judithe Modest, attest that this documentation has been prepared under the direction and in the presence of Milton Ferguson, MD. Electronically Signed: Judithe Modest, ER Scribe. 03/24/2016. 12:32 PM.   History   Chief Complaint Chief Complaint  Patient presents with  . Loss of Consciousness   The patient started a new beta blocker today. She stated he did that she felt dizzy and weak like she was in a pass out she also became a little sweaty   The history is provided by the patient. No language interpreter was used.  Loss of Consciousness   This is a new problem. The current episode started 1 to 2 hours ago. The problem occurs rarely. The problem has been resolved. She lost consciousness for a period of less than one minute. Pertinent negatives include abdominal pain, back pain, chest pain, congestion, headaches and seizures.    HPI Comments: Sabrina [name] is a 55 y.o. female who presents to the Emergency Department complaining of LOC, feeling hot, trouble breathing, and vomiting after taking a new medication this morning. Her coworkers stated that she passed out. She denies pain having any pain.  Past Medical History:  Diagnosis Date  . Allergy   . Anemia   . Arthritis   . Asthma   . Chronic bronchitis ([place])   . Diabetes mellitus   . Endometriosis   . Fibroid uterus   . GERD (gastroesophageal reflux disease)   . History of Doppler ultrasound    a. Carotid US 8/08:Estimated stenosis in the right and left internal carotid arteries is 0-50% and 0-50%.  . History of echocardiogram    a. Echo 6/16: EF 60-65%, no RWMA.  . History of hiatal hernia   . History of nuclear stress test    a. Myoview 6/16: There is no resting or stress perfusion defect consistent with no prior infarct and no ischemia. EF 73 %  The patient was hypertensive prior and during  the study. Cardiac cath 04/2016 showed normal coronary arteries  . Hx of iron deficiency anemia   . Hypersomnia with sleep apnea   . Hypertension   . 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 Menorrhagia   . Obesity   . Pneumonia   . 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 Secondary hyperthyroidism   . Thyroid disease   . Tinnitus    left ear  . Vertigo   . Vitamin D deficiency     Patient Active Problem List   Diagnosis Date Noted  . Hypocalcemia 07/07/2016  . Hypokalemia 07/07/2016  . Acute respiratory failure with hypoxia ([place]) 07/06/2016  . 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 Pulmonary hypertension 04/06/2016  . Chronic diastolic heart failure ([place]) 04/06/2016  . Precordial pain   . Nausea without vomiting 10/18/2015  . Psoriasis 10/18/2015  . Acute renal disease 08/24/2015  . GERD (gastroesophageal reflux disease) 07/05/2015  . History of hypercholesterolemia 07/05/2015  . 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 Headache 05/10/2015  . Scleroderma ([place]) 03/09/2015  . Panniculitis 03/09/2015  . Metabolic syndrome X 0000000  . Seasonal allergies 01/05/2015  . Allergic cough 01/05/2015  . Iron deficiency anemia 09/11/2014  . Tubular adenoma of colon 09/11/2014  . SECONDARY HYPERPARATHYROIDISM 12/02/2008  . Vitamin D deficiency 10/10/2008  . Morbid obesity with BMI of 45.0-49.9, adult ([place])  03/05/2008  . Diabetes mellitus with renal manifestation ([place]) 08/27/2007  . Essential hypertension 08/27/2007    Past Surgical History:  Procedure Laterality Date  . ABDOMINAL HYSTERECTOMY  2004   supracervical hysterectomy  . BREAST SURGERY  2005   reduction  . BREATH TEK H PYLORI [name] 12/24/2014   Procedure: BREATH TEK H PYLORI;  Surgeon: Greer Pickerel, MD;  Location: [facility];  Service: General;  Laterality: [name];  . CARDIAC CATHETERIZATION [name] 03/22/2016   Procedure: Right/Left Heart Cath and Coronary Angiography;   Surgeon: Troy Sine, MD;  Location: [facility];  Service: Cardiovascular;  Laterality: [name];  . DILATION AND CURETTAGE OF UTERUS     age 70  . ESOPHAGOGASTRODUODENOSCOPY (EGD) WITH PROPOFOL [name] 10/14/2015   Procedure: ESOPHAGOGASTRODUODENOSCOPY (EGD) WITH PROPOFOL;  Surgeon: Clarene Essex, MD;  Location: [facility];  Service: Endoscopy;  Laterality: [name];  . LAPAROSCOPIC GASTRIC SLEEVE RESECTION WITH HIATAL HERNIA REPAIR [name] 07/05/2015   Procedure: LAPAROSCOPIC GASTRIC SLEEVE RESECTION WITH HIATAL HERNIA REPAIR, upper endoscopy;  Surgeon: Greer Pickerel, MD;  Location: [facility];  Service: General;  Laterality: [name];    OB History    Gravida Para Term Preterm AB Living   0 0 0 0 0 0   SAB TAB Ectopic Multiple Live Births   0 0 0 0         Home Medications    Prior to Admission medications   Medication Sig Start Date End Date Taking? Authorizing Provider  ambrisentan (LETAIRIS) 5 MG tablet Take 5 mg by mouth daily.    Historical Provider, MD  aspirin EC 81 MG tablet Take 1 tablet (81 mg total) by mouth daily. 03/20/16   Liliane Shi, PA-C  benzonatate (TESSALON PERLES) 100 MG capsule Take 1 capsule (100 mg total) by mouth 2 (two) times daily as needed for cough. 07/04/16 07/04/17  Fayrene Helper, MD  ergocalciferol (VITAMIN D2) 50000 units capsule Take 1 capsule (50,000 Units total) by mouth once a week. 06/21/16   Fayrene Helper, MD  furosemide (LASIX) 40 MG tablet Take 1 tablet (40 mg total) by mouth daily. 07/27/16   [name], MD  guaiFENesin-dextromethorphan (ROBITUSSIN DM) 100-10 MG/5ML syrup Take 5 mLs by mouth every 6 (six) hours as needed for cough. 07/09/16   Erick Colace, NP  hydroxychloroquine (PLAQUENIL) 200 MG tablet Take 200 mg by mouth 2 (two) times daily. 06/13/16   Historical Provider, MD  montelukast (SINGULAIR) 10 MG tablet Take 1 tablet (10 mg total) by mouth at bedtime. Patient taking differently: Take 10 mg by mouth at bedtime as needed  (allergies).  06/07/16   Fayrene Helper, MD  Multiple Vitamin (MULTIVITAMIN WITH MINERALS) TABS tablet Take 1 tablet by mouth daily.    Historical Provider, MD  nadolol (CORGARD) 40 MG tablet Take 1 tablet (40 mg total) by mouth daily. 07/27/16   [name], MD  nitroGLYCERIN (NITROSTAT) 0.4 MG SL tablet Place 0.4 mg under the tongue every 5 (five) minutes as needed for chest pain (3 DOSES MAX).    Historical Provider, MD  ondansetron (ZOFRAN ODT) 4 MG disintegrating tablet One tablet daily, as needed , for nusea Patient taking differently: Take 4 mg by mouth every 6 (six) hours as needed for nausea or vomiting. One tablet daily, as needed , for nusea 06/07/16   Fayrene Helper, MD  pantoprazole (PROTONIX) 40 MG tablet Take 1 tablet (40 mg total) by mouth daily. 07/09/16   Ottis Stain  Kary Kos, NP  potassium chloride SA (K-DUR,KLOR-CON) 20 MEQ tablet Take 1 tablet (20 mEq total) by mouth 2 (two) times daily. 07/09/16   Erick Colace, NP  promethazine-dextromethorphan (PROMETHAZINE-DM) 6.25-15 MG/5ML syrup One teaspoon at bedtime as needed, for excessive cough 07/10/16   Fayrene Helper, MD  tadalafil, PAH, (ADCIRCA) 20 MG tablet Take 40 mg by mouth daily.    Historical Provider, MD    Family History Family History  Problem Relation Age of Onset  . Hypertension Mother   . Diabetes Mother   . Arthritis Mother   . Depression Mother   . CAD Mother     s/p PCI  . Diabetes Father   . Stroke Father   . Arthritis Father   . Depression Father   . Hypertension Father   . Cancer Father     prostate  . Hypertension Sister     x 2  . Breast cancer Sister   . Arthritis Sister   . Bleeding Disorder Sister   . Depression Sister   . Thyroid disease Sister   . Colon cancer Maternal Grandfather   . Stroke Maternal Aunt   . Heart attack Neg Hx     Social History Social History  Substance Use Topics  . Smoking status: Never Smoker  . Smokeless tobacco: Never Used  . Alcohol  use No     Allergies   Peanut-containing drug products; Potassium iodide; Ace inhibitors; Codeine; Doxycycline; Amlodipine; Latex; and Penicillins   Review of Systems Review of Systems  Constitutional: Negative for appetite change and fatigue.  HENT: Negative for congestion, ear discharge and sinus pressure.   Eyes: Negative for discharge.  Respiratory: Negative for cough.   Cardiovascular: Positive for syncope. Negative for chest pain.  Gastrointestinal: Negative for abdominal pain and diarrhea.  Genitourinary: Negative for frequency and hematuria.  Musculoskeletal: Negative for back pain.  Skin: Negative for rash.  Neurological: Positive for syncope. Negative for seizures and headaches.  Psychiatric/Behavioral: Negative for hallucinations.    Physical Exam Updated Vital Signs Ht 5' (1.524 m)   Wt 203 lb (92.1 kg)   LMP 10/31/2002   BMI 39.65 kg/m   Physical Exam  Constitutional: She is oriented to person, place, and time. She appears well-developed.  HENT:  Head: Normocephalic.  Eyes: Conjunctivae and EOM are normal. No scleral icterus.  Neck: Neck supple. No thyromegaly present.  Cardiovascular: Normal rate and regular rhythm.  Exam reveals no gallop and no friction rub.   No murmur heard. Pulmonary/Chest: No stridor. She has no wheezes. She has no rales. She exhibits no tenderness.  Abdominal: She exhibits no distension. There is no tenderness. There is no rebound.  Musculoskeletal: Normal range of motion. She exhibits no edema.  Lymphadenopathy:    She has no cervical adenopathy.  Neurological: She is oriented to person, place, and time. She exhibits normal muscle tone. Coordination normal.  Skin: No rash noted. No erythema.  Psychiatric: She has a normal mood and affect. Her behavior is normal.     ED Treatments / Results  Labs (all labs ordered are listed, but only abnormal results are displayed) Labs Reviewed  CBC WITH DIFFERENTIAL/PLATELET    [place], ED    EKG  EKG Interpretation None       Radiology No results found.  Procedures Procedures (including critical care time)  Medications Ordered in ED Medications  sodium chloride 0.9 % bolus 1,000 mL (not administered)     Initial Impression /  Assessment and Plan / ED Course  I have reviewed the triage vital signs and the nursing notes.  Pertinent labs & imaging results that were available during my care of the patient were reviewed by me and considered in my medical decision making (see chart for details).  Clinical Course    Patient had hypotension from the beta blocker.  Patient improved with fluids. She was instructed not to take the beta blocker again. She will follow-up with her PCP  Final Clinical Impressions(s) / ED Diagnoses  The patient had a second syncopal episode when she was being discharged.[name] She was observed in the ER given more fluids and then had a dizzy spell when she was using the restroom. Lactic acid was normal. Patient was admitted for syncope to telemetry by medicine Final diagnoses:  None    New Prescriptions New Prescriptions   No medications on file    The chart was scribed for me under my direct supervision.  I personally performed the history, physical, and medical decision making and all procedures in the evaluation of this patient.Milton Ferguson, MD 07/30/16 1517    Milton Ferguson, MD 07/30/16 [phone]

## 2016-07-30 NOTE — H&P (Signed)
TRH H&P    Patient Demographics:    Sabrina [name], is a 55 y.o. female  MRN: [id number]  DOB - [birth year]  Admit Date - 07/30/2016  Referring MD/NP/PA: Dr Roderic Palau  Outpatient Primary MD for the patient is [name], MD  Patient coming from: Home  Chief Complaint  Patient presents with  . Loss of Consciousness      HPI:    Sabrina [name]  is a 55 y.o. female, with h/o pulmonary hypertension, prolonged QTc, discoid lupusWho came to the hospital after patient passed out at her workplace around 10:30 AM. Patient was recently seen by EP cardiology on 07/27/2016 for prolonged QTC, and was prescribed Nadolol. Patient took first dose of nadolol this morning and developed symptoms of sweating, weakness, with eyes rolled backwards and became unconscious. Patient was brought to the ED, EKG again showed QTC prolongation 523, also had low potassium.  And patient was about to be discharged around 5 PM she had another episode of syncope where she became diaphoretic vomited and lost consciousness for about 10 seconds. Patient had another episode while using the restroom where she became dizzy but did not faint at this time.  She never had these episodes before, she denies chest pain or shortness of breath. These episodes have only started after she took nadolol this morning. She has pulmonary hypertension, and has been followed by pulmonary. Patient stopped Ralene Cork due to side effects.  Patient had gastric bypass surgery last year, and has intermittent episodes of diarrhea which last for many days. She has been having loose bowel movements or past 2 days. She has chronic hypokalemia and takes potassium supplementation.   Review of systems:    In addition to the HPI above,  No Fever-chills, No Headache, No changes with Vision or hearing, No problems swallowing food or Liquids, No Abdominal pain,  No Nausea or Vomiting, bowel movements are regular, No Blood in stool or Urine, No dysuria, + Chronic Rash on back, discoid lupus No new joints pains-aches,  No new weakness, tingling, numbness in any extremity, No recent weight gain or loss, No polyuria, polydypsia or polyphagia, No significant Mental Stressors.  A full 10 point Review of Systems was done, except as stated above, all other Review of Systems were negative.   With Past History of the following :    Past Medical History:  Diagnosis Date  . Allergy   . Anemia   . Arthritis   . Asthma   . Chronic bronchitis ([place])   . Diabetes mellitus   . Endometriosis   . Fibroid uterus   . GERD (gastroesophageal reflux disease)   . History of Doppler ultrasound    a. Carotid US 8/08:Estimated stenosis in the right and left internal carotid arteries is 0-50% and 0-50%.  . History of echocardiogram    a. Echo 6/16: EF 60-65%, no RWMA.  . History of hiatal hernia   . History of nuclear stress test    a. Myoview 6/16: There is no resting or stress perfusion defect consistent with no  prior infarct and no ischemia. EF 73 %  The patient was hypertensive prior and during the study. Cardiac cath 04/2016 showed normal coronary arteries  . Hx of iron deficiency anemia   . Hypersomnia with sleep apnea   . Hypertension   . 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 Menorrhagia   . Obesity   . Pneumonia   . 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 Secondary hyperthyroidism   . Thyroid disease   . Tinnitus    left ear  . Vertigo   . Vitamin D deficiency       Past Surgical History:  Procedure Laterality Date  . ABDOMINAL HYSTERECTOMY  2004   supracervical hysterectomy  . BREAST SURGERY  2005   reduction  . BREATH TEK H PYLORI [name] 12/24/2014   Procedure: BREATH TEK H PYLORI;  Surgeon: Greer Pickerel, MD;  Location: [facility];  Service: General;  Laterality: [name];  . CARDIAC CATHETERIZATION [name] 03/22/2016   Procedure: Right/Left Heart Cath and  Coronary Angiography;  Surgeon: Troy Sine, MD;  Location: [facility];  Service: Cardiovascular;  Laterality: [name];  . DILATION AND CURETTAGE OF UTERUS     age 25  . ESOPHAGOGASTRODUODENOSCOPY (EGD) WITH PROPOFOL [name] 10/14/2015   Procedure: ESOPHAGOGASTRODUODENOSCOPY (EGD) WITH PROPOFOL;  Surgeon: Clarene Essex, MD;  Location: [facility];  Service: Endoscopy;  Laterality: [name];  . LAPAROSCOPIC GASTRIC SLEEVE RESECTION WITH HIATAL HERNIA REPAIR [name] 07/05/2015   Procedure: LAPAROSCOPIC GASTRIC SLEEVE RESECTION WITH HIATAL HERNIA REPAIR, upper endoscopy;  Surgeon: Greer Pickerel, MD;  Location: [facility];  Service: General;  Laterality: [name];      Social History:      Social History  Substance Use Topics  . Smoking status: Never Smoker  . Smokeless tobacco: Never Used  . Alcohol use No       Family History :     Family History  Problem Relation Age of Onset  . Hypertension Mother   . Diabetes Mother   . Arthritis Mother   . Depression Mother   . CAD Mother     s/p PCI  . Diabetes Father   . Stroke Father   . Arthritis Father   . Depression Father   . Hypertension Father   . Cancer Father     prostate  . Hypertension Sister     x 2  . Breast cancer Sister   . Arthritis Sister   . Bleeding Disorder Sister   . Depression Sister   . Thyroid disease Sister   . Colon cancer Maternal Grandfather   . Stroke Maternal Aunt   . Heart attack Neg Hx      Home Medications:   Prior to Admission medications   Medication Sig Start Date End Date Taking? Authorizing Provider  acetaminophen (TYLENOL) 500 MG tablet Take 500 mg by mouth every 6 (six) hours as needed for headache.   Yes Historical Provider, MD  ambrisentan (LETAIRIS) 5 MG tablet Take 5 mg by mouth daily.   Yes Historical Provider, MD  aspirin EC 81 MG tablet Take 1 tablet (81 mg total) by mouth daily. 03/20/16  Yes Scott T Kathlen Mody, PA-C  benzonatate (TESSALON PERLES) 100 MG capsule Take 1 capsule (100 mg total) by mouth  2 (two) times daily as needed for cough. 07/04/16 07/04/17 Yes Fayrene Helper, MD  ergocalciferol (VITAMIN D2) 50000 units capsule Take 1 capsule (50,000 Units total) by mouth once a week. Patient taking differently: Take 50,000 Units by mouth once  a week. On Monday. 06/21/16  Yes Fayrene Helper, MD  furosemide (LASIX) 40 MG tablet Take 1 tablet (40 mg total) by mouth daily. 07/27/16  Yes [name], MD  guaiFENesin-dextromethorphan [facility] DM) 100-10 MG/5ML syrup Take 5 mLs by mouth every 6 (six) hours as needed for cough. 07/09/16  Yes Erick Colace, NP  hydroxychloroquine (PLAQUENIL) 200 MG tablet Take 200 mg by mouth 2 (two) times daily. 06/13/16  Yes Historical Provider, MD  montelukast (SINGULAIR) 10 MG tablet Take 1 tablet (10 mg total) by mouth at bedtime. Patient taking differently: Take 10 mg by mouth at bedtime as needed (allergies).  06/07/16  Yes Fayrene Helper, MD  Multiple Vitamin (MULTIVITAMIN WITH MINERALS) TABS tablet Take 1 tablet by mouth daily.   Yes Historical Provider, MD  nadolol (CORGARD) 40 MG tablet Take 1 tablet (40 mg total) by mouth daily. 07/27/16  Yes [name], MD  nitroGLYCERIN (NITROSTAT) 0.4 MG SL tablet Place 0.4 mg under the tongue every 5 (five) minutes as needed for chest pain (3 DOSES MAX).   Yes Historical Provider, MD  ondansetron (ZOFRAN ODT) 4 MG disintegrating tablet One tablet daily, as needed , for nusea Patient taking differently: Take 4 mg by mouth every 6 (six) hours as needed for nausea or vomiting. One tablet daily, as needed , for nusea 06/07/16  Yes Fayrene Helper, MD  pantoprazole (PROTONIX) 40 MG tablet Take 1 tablet (40 mg total) by mouth daily. 07/09/16  Yes Erick Colace, NP  potassium chloride SA (K-DUR,KLOR-CON) 20 MEQ tablet Take 1 tablet (20 mEq total) by mouth 2 (two) times daily. Patient taking differently: Take 40 mEq by mouth daily.  07/09/16  Yes Erick Colace, NP  tadalafil, PAH, (ADCIRCA)  20 MG tablet Take 40 mg by mouth daily.    Historical Provider, MD     Allergies:     Allergies  Allergen Reactions  . Peanut-Containing Drug Products Nausea And Vomiting  . Potassium Iodide Other (See Comments)    facial swelling, also eyes, and ears  . Ace Inhibitors Cough  . Codeine Nausea And Vomiting  . Doxycycline Hives  . Amlodipine Rash  . Latex Hives and Rash  . Penicillins Hives, Itching and Rash    .Marland KitchenHas patient had a PCN reaction causing immediate rash, facial/tongue/throat swelling, SOB or lightheadedness with hypotension: No Has patient had a PCN reaction causing severe rash involving mucus membranes or skin necrosis: No Has patient had a PCN reaction that required hospitalization No Has patient had a PCN reaction occurring within the last 10 years: YES (3 years ago)  If all of the above answers are "NO", then may proceed with Cephalosporin use.      Physical Exam:   Vitals  Blood pressure 149/92, pulse 69, temperature 97.7 F (36.5 C), temperature source Oral, resp. rate 18, height 5' (1.524 m), weight 92.1 kg (203 lb), last menstrual period 10/31/2002, SpO2 99 %.  1.  General: Appears in no acute distress  2. Psychiatric:  Intact judgement and  insight, awake alert, oriented x 3.  3. Neurologic: No focal neurological deficits, all cranial nerves intact.Strength 5/5 all 4 extremities, sensation intact all 4 extremities, plantars down going.  4. Eyes :  anicteric sclerae, moist conjunctivae with no lid lag. PERRLA.  5. ENMT:  Oropharynx clear with moist mucous membranes and good dentition  6. Neck:  supple, no cervical lymphadenopathy appriciated, No thyromegaly  7. Respiratory : Normal respiratory effort, good  air movement bilaterally,clear to  auscultation bilaterally  8. Cardiovascular : RRR, no gallops, rubs or murmurs, no leg edema  9. Gastrointestinal:  Positive bowel sounds, abdomen soft, non-tender to palpation,no hepatosplenomegaly, no  rigidity or guarding       10. Skin:  Chronic hypopigmented rash noted on the back  11.Musculoskeletal:  Good muscle tone,  joints appear normal , no effusions,  normal range of motion    Data Review:    CBC  Recent Labs Lab 07/30/16 1238  WBC 14.2*  HGB 12.7  HCT 39.9  PLT 318  MCV 88.3  MCH 28.1  MCHC 31.8  RDW 15.1  LYMPHSABS 3.2  MONOABS 1.0  EOSABS 0.8*  BASOSABS 0.0   ------------------------------------------------------------------------------------------------------------------  Chemistries   Recent Labs Lab 07/30/16 1238  NA 140  K 3.0*  CL 103  CO2 24  GLUCOSE 148*  BUN 18  CREATININE 1.08*  CALCIUM 7.2*  AST 61*  ALT 28  ALKPHOS 61  BILITOT 0.7   ------------------------------------------------------------------------------------------------------------------  ------------------------------------------------------------------------------------------------------------------ GFR: Estimated Creatinine Clearance: 61 mL/min (by C-G formula based on SCr of 1.08 mg/dL (H)). Liver Function Tests:  Recent Labs Lab 07/30/16 1238  AST 61*  ALT 28  ALKPHOS 61  BILITOT 0.7  PROT 7.6  ALBUMIN 3.3*    --------------------------------------------------------------------------------------------------------------- Urine analysis:    Component Value Date/Time   COLORURINE YELLOW 10/13/2014 2352   APPEARANCEUR CLEAR 10/13/2014 2352   LABSPEC 1.010 10/13/2014 2352   PHURINE 6.0 10/13/2014 2352   GLUCOSEU >1000 (A) 10/13/2014 2352   HGBUR NEGATIVE 10/13/2014 2352   BILIRUBINUR NEGATIVE 10/13/2014 2352   KETONESUR NEGATIVE 10/13/2014 2352   PROTEINUR NEGATIVE 10/13/2014 2352   UROBILINOGEN 0.2 10/13/2014 2352   NITRITE NEGATIVE 10/13/2014 2352   LEUKOCYTESUR NEGATIVE 10/13/2014 2352      Imaging Results:    Dg Chest Portable 1 View  Result Date: 07/30/2016 CLINICAL DATA:  Syncope. EXAM: PORTABLE CHEST 1 VIEW COMPARISON:  Radiographs of  July 06, 2016. FINDINGS: Stable cardiomediastinal silhouette. No pneumothorax or pleural effusion is noted. Both lungs are clear. The visualized skeletal structures are unremarkable. IMPRESSION: No acute cardiopulmonary abnormality seen. Electronically Signed   By: Marijo Conception, M.D.   On: 07/30/2016 13:08    My personal review of EKG: Rhythm NSR, QTC 523   Assessment & Plan:    Active Problems:   Diabetes mellitus with renal manifestation (HCC)   Pulmonary hypertension   Prolonged QT syndrome   Hypocalcemia   Hypokalemia   Syncope   1. Syncope- unclear cause, she has prolonged QTC syndrome. Will hold nadolol which was started this morning. Monitor on telemetry, obtain serial cardiac enzymes, check orthostatic vital signs every shift. Will obtain cardiology consultation in a.m. 2. Prolonged QTc interval- patient has a history of chronic hypokalemia, will check serum magnesium. Replace potassium. Will also hold Plaquenil and Protonix, both of which can prolonged QTc. 3. Hypokalemia- replace potassium and check BMP in a.m. 4. Pulmonary hypertension-patient followed by pulmonary as outpatient, echocardiogram in August showed severe pulmonary hypertension with PA pressure of 91 mmHg. Medications Adcirca, Letairis prescribed by Dr. Kyung Rudd are currently on hold. Will hold these medications.    DVT Prophylaxis-   Lovenox   AM Labs Ordered, also please review Full Orders  Family Communication: Admission, patients condition and plan of care including tests being ordered have been discussed with the patient and her mother at bedside who indicate understanding and agree with the plan and Code Status.  Code Status:  Full code  Admission status: Observation    Time spent in minutes : 60 min   Konnor Vondrasek S M.D on 07/30/2016 at 7:49 PM  Between 7am to 7pm - Pager - [phone]. After 7pm go to [email or web address] - password [facility]  [facility] - Office  [phone]

## 2016-07-31 DIAGNOSIS — R55 Syncope and collapse: Secondary | ICD-10-CM | POA: Diagnosis not present

## 2016-07-31 DIAGNOSIS — I272 Pulmonary hypertension, unspecified: Secondary | ICD-10-CM | POA: Diagnosis not present

## 2016-07-31 DIAGNOSIS — R9431 Abnormal electrocardiogram [ECG] [EKG]: Secondary | ICD-10-CM | POA: Diagnosis not present

## 2016-07-31 DIAGNOSIS — I4581 Long QT syndrome: Secondary | ICD-10-CM | POA: Diagnosis not present

## 2016-07-31 LAB — COMPREHENSIVE METABOLIC PANEL
ALT: 25 U/L (ref 14–54)
ANION GAP: 8 (ref 5–15)
AST: 37 U/L (ref 15–41)
Albumin: 3.3 g/dL — ABNORMAL LOW (ref 3.5–5.0)
Alkaline Phosphatase: 57 U/L (ref 38–126)
BUN: 17 mg/dL (ref 6–20)
CO2: 23 mmol/L (ref 22–32)
CREATININE: 1.04 mg/dL — AB (ref 0.44–1.00)
Calcium: 7.1 mg/dL — ABNORMAL LOW (ref 8.9–10.3)
Chloride: 109 mmol/L (ref 101–111)
Glucose, Bld: 94 mg/dL (ref 65–99)
POTASSIUM: 5.8 mmol/L — AB (ref 3.5–5.1)
SODIUM: 140 mmol/L (ref 135–145)
Total Bilirubin: 0.5 mg/dL (ref 0.3–1.2)
Total Protein: 7.2 g/dL (ref 6.5–8.1)

## 2016-07-31 LAB — CBC
HCT: 38.9 % (ref 36.0–46.0)
Hemoglobin: 12.3 g/dL (ref 12.0–15.0)
MCH: 27.8 pg (ref 26.0–34.0)
MCHC: 31.6 g/dL (ref 30.0–36.0)
MCV: 88 fL (ref 78.0–100.0)
PLATELETS: 283 10*3/uL (ref 150–400)
RBC: 4.42 MIL/uL (ref 3.87–5.11)
RDW: 15 % (ref 11.5–15.5)
WBC: 10.3 10*3/uL (ref 4.0–10.5)

## 2016-07-31 LAB — TROPONIN I

## 2016-07-31 MED ORDER — POTASSIUM CHLORIDE CRYS ER 20 MEQ PO TBCR
40.0000 meq | EXTENDED_RELEASE_TABLET | Freq: Every day | ORAL | Status: DC
  Administered 2016-08-01: 40 meq via ORAL
  Filled 2016-07-31 (×3): qty 2

## 2016-07-31 NOTE — Consult Note (Signed)
CARDIOLOGY CONSULT NOTE   Sabrina [name] ID: Sabrina Sabrina [name] Sabrina [name] MRN: [id number] DOB/AGE: [birth year] 53 y.o.  Admit Date: 07/30/2016 Referring Physician: Erasmo Score MD Primary Physician: Sabrina Nakayama, MD Consulting Cardiologist: Sabrina Sable MD Primary Cardiologist: Sabrina Sabrina [name] Him MD Electrophysiologist: Sabrina Lai MD Reason for Consultation: Syncope  Clinical Summary Sabrina Sabrina [name] Sabrina [name] is a 55 y.o.female with known history of hypertension, chronic diastolic CHF,  hyperlipidemia, diabetes type 2, morbid obesity, and pulmonary hypertension, who presents to [facility]rina [facility]rina [name] emergency room after syncopal episode. She was recently seen by Dr. Curt [name] EP evaluation secondary to prolonged QT interval. Her QTc interval was 523 ms. Sabrina Sabrina [name] Sabrina [name] was seen on 07/27/2016. She was started on nadolol 40 mg daily, but it was felt that her syncope and near-syncope were more respiratory in nature.She is being followed by pulmonary for severe pulmonary hypertension.   On arrival to [name] emergency room she was hypotensive with a blood pressure 97/77, heart rate 70 O2 sat 98% she was afebrile. UA revealed proteinuria but no evidence of infection. Sabrina [name] did have some leukocytosis, white blood cells 14.2 (compared to labs on 07/06/2016 at 16.7, these have improved )but was not found to be anemic. She was noted to be hypokalemic with potassium 3.0, Mg 2.2, serum creatinine 1.08.BNP 355.0.  EKG sinus rhythm with prolonged QT interval of 546 ms, right ventricular hypertrophy, rate of 72 bpm. Sabrina Sabrina [name] Sabrina [name] states when she took Sabrina Sabrina [name] new medication on Monday and within one hour she suddenly began to feel dizzy and lightheaded. Sabrina Sabrina [name] Sabrina [name] became diaphoretic and vomited. She's had progressive dizziness. Since being here she has had another episode of dizziness and vomiting. However when nurse investigated this there was no emesis seen. She is now on complete bedrest.    Allergies  Allergen Reactions  . Peanut-Containing Drug  Products Nausea And Vomiting  . Potassium Iodide Other (See Comments)    facial swelling, also eyes, and ears  . Ace Inhibitors Cough  . Codeine Nausea And Vomiting  . Doxycycline Hives  . Amlodipine Rash  . Latex Hives and Rash  . Penicillins Hives, Itching and Rash    .Marland KitchenHas Sabrina [name] had a PCN reaction causing immediate rash, facial/tongue/throat swelling, SOB or lightheadedness with hypotension: No Has Sabrina [name] had a PCN reaction causing severe rash involving mucus membranes or skin necrosis: No Has Sabrina [name] had a PCN reaction that required hospitalization No Has Sabrina [name] had a PCN reaction occurring within Sabrina Sabrina [name] last 10 years: YES (3 years ago)  If all of Sabrina Sabrina [name] above answers are "NO", then may proceed with Cephalosporin use.     Medications Scheduled Medications: . aspirin EC  81 mg Oral Daily  . enoxaparin (LOVENOX) injection  40 mg Subcutaneous Q24H  . montelukast  10 mg Oral QHS  . [START ON 08/01/2016] potassium chloride SA  40 mEq Oral Daily     Infusions: . sodium chloride 50 mL/hr at 07/30/16 2214     PRN Medications:  acetaminophen, LORazepam   Past Medical History:  Diagnosis Date  . Allergy   . Anemia   . Arthritis   . Asthma   . Chronic bronchitis ([place])   . Diabetes mellitus   . Endometriosis   . Fibroid uterus   . GERD (gastroesophageal reflux disease)   . History of Doppler ultrasound    a. Carotid US 8/08:Estimated stenosis in Sabrina Sabrina [name] right and left internal carotid arteries is 0-50% and 0-50%.  . History of echocardiogram    a. Echo 6/16: EF 60-65%, no RWMA.  . History of  hiatal hernia   . History of nuclear stress test    a. Myoview 6/16: There is no resting or stress perfusion defect consistent with no prior infarct and no ischemia. EF 73 %  Sabrina Sabrina [name] Sabrina [name] was hypertensive prior and during Sabrina Sabrina [name] study. Cardiac cath 04/2016 showed normal coronary arteries  . Hx of iron deficiency anemia   . Hypersomnia with sleep apnea   . Hypertension   . 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 Menorrhagia   . Obesity   . Pneumonia   . 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 Secondary hyperthyroidism   . Thyroid disease   . Tinnitus    left ear  . Vertigo   . Vitamin D deficiency     Past Surgical History:  Procedure Laterality Date  . ABDOMINAL HYSTERECTOMY  2004   supracervical hysterectomy  . BREAST SURGERY  2005   reduction  . BREATH TEK H PYLORI [name] 12/24/2014   Procedure: BREATH TEK H PYLORI;  Surgeon: Greer Pickerel, MD;  Location: [facility];  Service: General;  Laterality: [name];  . CARDIAC CATHETERIZATION [name] 03/22/2016   Procedure: Right/Left Heart Cath and Coronary Angiography;  Surgeon: Troy Sine, MD;  Location: [facility];  Service: Cardiovascular;  Laterality: [name];  . DILATION AND CURETTAGE OF UTERUS     age 4  . ESOPHAGOGASTRODUODENOSCOPY (EGD) WITH PROPOFOL [name] 10/14/2015   Procedure: ESOPHAGOGASTRODUODENOSCOPY (EGD) WITH PROPOFOL;  Surgeon: Clarene Essex, MD;  Location: [facility];  Service: Endoscopy;  Laterality: [name];  . LAPAROSCOPIC GASTRIC SLEEVE RESECTION WITH HIATAL HERNIA REPAIR [name] 07/05/2015   Procedure: LAPAROSCOPIC GASTRIC SLEEVE RESECTION WITH HIATAL HERNIA REPAIR, upper endoscopy;  Surgeon: Greer Pickerel, MD;  Location: [facility];  Service: General;  Laterality: [name];    Family History  Problem Relation Age of Onset  . Hypertension Mother   . Diabetes Mother   . Arthritis Mother   . Depression Mother   . CAD Mother     s/p PCI  . Diabetes Father   . Stroke Father   . Arthritis Father   . Depression Father   . Hypertension Father   . Cancer Father     prostate  . Hypertension Sister     x 2  . Breast cancer Sister   . Arthritis Sister   . Bleeding Disorder Sister   . Depression Sister   . Thyroid disease Sister   . Colon cancer Maternal Grandfather   . Stroke Maternal Aunt   . Heart attack Neg Hx      Social History Sabrina Sabrina [name] Sabrina [name] reports that she has never smoked. She has never used smokeless tobacco. Sabrina Sabrina [name] Sabrina [name]  reports that she does not drink alcohol.  Review of Systems Complete review of systems are found to be negative unless outlined in H&P above.  Physical Examination Blood pressure (!) 141/82, pulse 68, temperature 97.4 F (36.3 C), temperature source Oral, resp. rate 18, height 5' (1.524 m), weight 203 lb (92.1 kg), last menstrual period 10/31/2002, SpO2 96 %.  Intake/Output Summary (Last 24 hours) at 07/31/16 1122 Last data filed at 07/31/16 0900  Gross per 24 hour  Intake             3216 ml  Output              300 ml  Net             2916 ml    Telemetry: NSR, no evidence of VT, PSVT or pauses.  GEN: No acute distress HEENT: Conjunctiva and lids normal, oropharynx clear with moist mucosa. Neck: Supple, no elevated JVP or carotid bruits, no thyromegaly. Lungs: Clear to auscultation, nonlabored breathing at rest. Cardiac: Regular rate and rhythm, no S3 or significant systolic murmur, no pericardial rub. Abdomen: Soft, nontender, no hepatomegaly, bowel sounds present, no guarding or rebound. Extremities: No pitting edema, distal pulses 2+. Skin: Warm and dry. Musculoskeletal: No kyphosis. Neuropsychiatric: Alert and oriented x3, affect grossly appropriate.  Prior Cardiac Testing/Procedures 1. Cardiac Cath 03/22/2016 Conclusion     Sabrina Sabrina [name] left ventricular systolic function is normal.  Sabrina Sabrina [name] left ventricular ejection fraction is 55-65% by visual estimate.  Hemodynamic findings consistent with severe pulmonary hypertension.   Severe right ventricular systolic pressure and pulmonary hypertension with systolic pressures greater than 100 mmHg.  Significant pulmonary capillary/LV gradient, without previous evidence of mitral stenosis on echocardiography.  Normal coronary arteries.  Normal LV systolic function with left ventricular hypertrophy.    2. Echocardiogram Left ventricle: Sabrina Sabrina [name] cavity size was normal. Systolic function was   normal. Sabrina Sabrina [name] estimated ejection fraction  was in Sabrina Sabrina [name] range of 50%   to 55%. Wall motion was normal; there were no regional wall   motion abnormalities. Doppler parameters are consistent with   abnormal left ventricular relaxation (grade 1 diastolic   dysfunction). Doppler parameters are consistent with high   ventricular filling pressure. - Ventricular septum: Sabrina Sabrina [name] contour showed diastolic flattening and   systolic flattening consistent with right ventricular pressure   and volume overload. - Aortic valve: Transvalvular velocity was within Sabrina Sabrina [name] normal range.   There was no stenosis. There was no regurgitation. - Mitral valve: There was trivial regurgitation. - Right ventricle: Sabrina Sabrina [name] cavity size was normal. Wall thickness was   normal. Systolic function was reduced. - Tricuspid valve: There was mild regurgitation. - Pulmonary arteries: Systolic pressure was severely increased. PA   peak pressure: 91 mm Hg (S). - Pericardium, extracardiac: A trivial pericardial effusion was   identified. Impressions: Severe pulmonary hypertension.  Lab Results  Basic Metabolic Panel:  Recent Labs Lab 07/30/16 1238 07/30/16 1305 07/31/16 0332  NA 140  --  140  K 3.0*  --  5.8*  CL 103  --  109  CO2 24  --  23  GLUCOSE 148*  --  94  BUN 18  --  17  CREATININE 1.08*  --  1.04*  CALCIUM 7.2*  --  7.1*  MG  --  2.2  --     Liver Function Tests:  Recent Labs Lab 07/30/16 1238 07/31/16 0332  AST 61* 37  ALT 28 25  ALKPHOS 61 57  BILITOT 0.7 0.5  PROT 7.6 7.2  ALBUMIN 3.3* 3.3*    CBC:  Recent Labs Lab 07/30/16 1238 07/31/16 0332  WBC 14.2* 10.3  NEUTROABS 9.2*  --   HGB 12.7 12.3  HCT 39.9 38.9  MCV 88.3 88.0  PLT 318 283    Cardiac Enzymes:  Recent Labs Lab 07/30/16 2105 07/31/16 0332 07/31/16 1048  TROPONINI <0.03 <0.03 <0.03    Radiology: Dg Chest Portable 1 View  Result Date: 07/30/2016 CLINICAL DATA:  Syncope. EXAM: PORTABLE CHEST 1 VIEW COMPARISON:  Radiographs of July 06, 2016. FINDINGS:  Stable cardiomediastinal silhouette. No pneumothorax or pleural effusion is noted. Both lungs are clear. Sabrina Sabrina [name] visualized skeletal structures are unremarkable. IMPRESSION: No acute cardiopulmonary abnormality seen. Electronically Signed   By: Marijo Conception, M.D.   On: 07/30/2016 13:08     ECG: SR with  RVH, QTc 546 ms. Rate of 72 bpm.    Impression and Recommendations 1.Syncope: Sabrina Sabrina [name] Sabrina [name] had an episode of syncope after taking nadolol recently prescribed for prolonged QT interval. This was associated with diaphoresis and severe nausea. She was seen in [facility]rina Sabrina [name] emergency room and watched for several hours and had planned to go home but when she got up off of Sabrina Sabrina [name] stretcher she again had a syncopal episode.  EKG reveals sinus rhythm with RVH, prolonged QTc interval of 546 ms. She is unable to tolerate beta blocker. This has been discontinued. Would recommend follow-up with EP for alternate medications. Magnesium 2.2 on blood draw despite hypokalemia. Consider cardiac monitor and or loop recorder. We'll defer to EP. No further cardiac recommendations at this time.   2. Severe Pulmonary Hypertension: She is being followed by pulmonary, Dr. Lamonte Sakai. He felt her pulmonary hypertension was multifactorial, and suspected Sabrina Sabrina [name] most significant contributor is connective tissue disease. She was started on Litairis. She did not tolerate Adcirca as it caused migraines and severe diarrhea.  3. Morbid obesity: Consider hypoventilation syndrome as another contributor to her respiratory status.   Signed: Phill Myron. Lawrence NP [place]  07/31/2016, 11:22 AM Co-Sign MD  Sabrina Sabrina [name] Sabrina [name] was seen and examined, and I agree with Sabrina Sabrina [name] history, physical exam, assessment and plan as documented above, with modifications as noted below. Pt with prolonged QTc (followed by EP) admitted after several syncopal episodes after taking one dose of nadolol. No prior syncopal episodes. Troponins normal. She is unable to tolerate beta blockers,  specifically nadolol. Would recommend she follow up with EP for alternative strategies. Continue supportive care with IV fluids and bedrest. Should be able to be discharged tomorrow barring anything unforeseen.  Sabrina Sable, MD, [facility]  07/31/2016 2:23 PM

## 2016-07-31 NOTE — Progress Notes (Signed)
PROGRESS NOTE    Sabrina [name]  [id number] DOB: [birth year] DOA: 07/30/2016 PCP: [name], MD     Brief Narrative:  55 y/o woman admitted from home on 12/18 after 2 syncopal events.   Assessment & Plan:   Active Problems:   Diabetes mellitus with renal manifestation (HCC)   Pulmonary hypertension   Prolonged QT syndrome   Hypocalcemia   Hypokalemia   Syncope   Syncope -Unclear if due to BB intolerance, especially with prolonged QT (prescribed by EP). -Nadolol has been discontinued. -May benefit from event monitor on DC. -Unclear if pulmonary HTN playing a role as well.  Pulmonary HTN -Followed by Dr. Lamonte Sakai, on Litairis.  Hypokalemia -now with hyperkalemia. -hold further K supplements.     DVT prophylaxis: lovenox Code Status: full code Family Communication: husband and mother at bedside Disposition Plan: home in 24 hours, anticipate  Consultants:   Cardiology  Procedures:   None  Antimicrobials:  Anti-infectives    None       Subjective: No complaints, fells well, no CP or SOB  Objective: Vitals:   07/31/16 0528 07/31/16 0620 07/31/16 0900 07/31/16 1406  BP:    139/89  Pulse:  68  72  Resp:    18  Temp:  97.4 F (36.3 C)  98.8 F (37.1 C)  TempSrc:  Oral  Oral  SpO2: 97% 95% 96% 96%  Weight:      Height:        Intake/Output Summary (Last 24 hours) at 07/31/16 1555 Last data filed at 07/31/16 1100  Gross per 24 hour  Intake             2356 ml  Output              300 ml  Net             2056 ml   Filed Weights   07/30/16 1219  Weight: 92.1 kg (203 lb)    Examination:  General exam: Alert, awake, oriented x 3 Respiratory system: Clear to auscultation. Respiratory effort normal. Cardiovascular system:RRR. No murmurs, rubs, gallops. Gastrointestinal system: Abdomen is nondistended, soft and nontender. No organomegaly or masses felt. Normal bowel sounds heard. Central nervous system: Alert and oriented.  No focal neurological deficits. Extremities: No C/C/E, +pedal pulses Skin: No rashes, lesions or ulcers Psychiatry: Judgement and insight appear normal. Mood & affect appropriate.     Data Reviewed: I have personally reviewed following labs and imaging studies  CBC:  Recent Labs Lab 07/30/16 1238 07/31/16 0332  WBC 14.2* 10.3  NEUTROABS 9.2*  --   HGB 12.7 12.3  HCT 39.9 38.9  MCV 88.3 88.0  PLT 318 [id number]   Basic Metabolic Panel:  Recent Labs Lab 07/30/16 1238 07/30/16 1305 07/31/16 0332  NA 140  --  140  K 3.0*  --  5.8*  CL 103  --  109  CO2 24  --  23  GLUCOSE 148*  --  94  BUN 18  --  17  CREATININE 1.08*  --  1.04*  CALCIUM 7.2*  --  7.1*  MG  --  2.2  --    GFR: Estimated Creatinine Clearance: 63.3 mL/min (by C-G formula based on SCr of 1.04 mg/dL (H)). Liver Function Tests:  Recent Labs Lab 07/30/16 1238 07/31/16 0332  AST 61* 37  ALT 28 25  ALKPHOS 61 57  BILITOT 0.7 0.5  PROT 7.6 7.2  ALBUMIN 3.3* 3.3*   No results for  input(s): LIPASE, AMYLASE in the last 168 hours. No results for input(s): AMMONIA in the last 168 hours. Coagulation Profile: No results for input(s): INR, PROTIME in the last 168 hours. Cardiac Enzymes:  Recent Labs Lab 07/30/16 2105 07/31/16 0332 07/31/16 1048  TROPONINI <0.03 <0.03 <0.03   BNP (last 3 results) No results for input(s): PROBNP in the last 8760 hours. HbA1C: No results for input(s): HGBA1C in the last 72 hours. CBG: No results for input(s): GLUCAP in the last 168 hours. Lipid Profile: No results for input(s): CHOL, HDL, LDLCALC, TRIG, CHOLHDL, LDLDIRECT in the last 72 hours. Thyroid Function Tests: No results for input(s): TSH, T4TOTAL, FREET4, T3FREE, THYROIDAB in the last 72 hours. Anemia Panel: No results for input(s): VITAMINB12, FOLATE, FERRITIN, TIBC, IRON, RETICCTPCT in the last 72 hours. Urine analysis:    Component Value Date/Time   COLORURINE YELLOW 07/30/2016 [place]  07/30/2016 1837   LABSPEC 1.020 07/30/2016 1837   PHURINE 7.0 07/30/2016 1837   GLUCOSEU NEGATIVE 07/30/2016 1837   HGBUR NEGATIVE 07/30/2016 1837   BILIRUBINUR NEGATIVE 07/30/2016 1837   KETONESUR NEGATIVE 07/30/2016 1837   PROTEINUR 100 (A) 07/30/2016 1837   UROBILINOGEN 0.2 10/13/2014 2352   NITRITE NEGATIVE 07/30/2016 1837   LEUKOCYTESUR NEGATIVE 07/30/2016 1837   Sepsis Labs: @LABRCNTIP (procalcitonin:4,lacticidven:4)  )No results found for this or any previous visit (from the past 240 hour(s)).       Radiology Studies: Dg Chest Portable 1 View  Result Date: 07/30/2016 CLINICAL DATA:  Syncope. EXAM: PORTABLE CHEST 1 VIEW COMPARISON:  Radiographs of July 06, 2016. FINDINGS: Stable cardiomediastinal silhouette. No pneumothorax or pleural effusion is noted. Both lungs are clear. The visualized skeletal structures are unremarkable. IMPRESSION: No acute cardiopulmonary abnormality seen. Electronically Signed   By: Marijo Conception, M.D.   On: 07/30/2016 13:08        Scheduled Meds: . aspirin EC  81 mg Oral Daily  . enoxaparin (LOVENOX) injection  40 mg Subcutaneous Q24H  . montelukast  10 mg Oral QHS  . [START ON 08/01/2016] potassium chloride SA  40 mEq Oral Daily   Continuous Infusions: . sodium chloride 50 mL/hr at 07/30/16 2214     LOS: 0 days    Time spent: 25 minutes. Greater than 50% of this time was spent in direct contact with the patient coordinating care.     Lelon Frohlich, MD [facility] Pager [phone]  If 7PM-7AM, please contact night-coverage [email or web address] Password [email or web address] 07/31/2016, 3:55 PM

## 2016-07-31 NOTE — Care Management Note (Signed)
Case Management Note  Patient Details  Name: Sabrina [name] MRN: [id number] Date of Birth: [birth year]  Subjective/Objective:  Patient adm with syncopal episode. She is ind with ADL's. Has PCP, transportation, and insurance with prescription coverage.             Action/Plan: Anticipate DC home with self care.    Expected Discharge Date:  07/31/16               Expected Discharge Plan:  Home/Self Care  In-House Referral:     Discharge planning Services  CM Consult  Post Acute Care Choice:  NA Choice offered to:  NA  DME Arranged:    DME Agency:     HH Arranged:    HH Agency:     Status of Service:  In process, will continue to follow  If discussed at Long Length of Stay Meetings, dates discussed:    Additional Comments:  Yesena Reaves, Chauncey Reading, RN 07/31/2016, 2:41 PM

## 2016-07-31 NOTE — Progress Notes (Signed)
Patient was up in bathroom with assist to wash up. Patient denies dizziness when first standing, but c/o feeling "faint" once up a few minutes. Patient diaphoretic and c/o nausea when up. Patient and spouse stated that patient vommited twice, which was left in toilet for observation. This RN only observed scant amount of yellow substance, like spit, and toilet paper, nothing else apparent in toilet. Ativan ordered PRN for N/V. Patient resting in bed and denies N/V at this time. Patient states that symptoms are gone and only present when up. Patient encouraged to stay in bed and call for bedpan if needed d/t symptoms when up. Patient and spouse agree. Spouse in room with patient at this time.

## 2016-08-01 DIAGNOSIS — R55 Syncope and collapse: Secondary | ICD-10-CM | POA: Diagnosis not present

## 2016-08-01 DIAGNOSIS — I4581 Long QT syndrome: Secondary | ICD-10-CM | POA: Diagnosis not present

## 2016-08-01 LAB — BASIC METABOLIC PANEL
Anion gap: 9 (ref 5–15)
BUN: 20 mg/dL (ref 6–20)
CHLORIDE: 108 mmol/L (ref 101–111)
CO2: 21 mmol/L — AB (ref 22–32)
Calcium: 7.2 mg/dL — ABNORMAL LOW (ref 8.9–10.3)
Creatinine, Ser: 1.12 mg/dL — ABNORMAL HIGH (ref 0.44–1.00)
GFR calc Af Amer: >60 mL/min (ref >60)
GFR calc non Af Amer: 55 mL/min — ABNORMAL LOW (ref >60)
GLUCOSE: 101 mg/dL — AB (ref 65–99)
POTASSIUM: 3.4 mmol/L — AB (ref 3.5–5.1)
Sodium: 138 mmol/L (ref 135–145)

## 2016-08-01 NOTE — Progress Notes (Signed)
RN notified MD that patient's IV came out and patient states she is a difficult stick. MD stated OK for patient to be without Iv. RN also notified him of results from orthostatic blood pressures that were completed this Am. RN notified him of potassium level of 3.4 and he stated OK to give scheduled 40 meq of potassium.

## 2016-08-01 NOTE — Progress Notes (Signed)
Patient verbalized understanding of discharge instructions. Patient able to teach back info. IV removed prior in nursing shift. Telemetry box removed and tele room notified. Patient taken downstairs in wheelchair by nursing tech, patient's husband down by main entrance to take patient home. Patient complains of no dizziness.

## 2016-08-01 NOTE — Discharge Summary (Signed)
Physician Discharge Summary  Sabrina [name] [facility] [id number] DOB: Jul 13, 1961 DOA: 07/30/2016  PCP: [name], MD  Admit date: 07/30/2016 Discharge date: 08/01/2016  Recommendations for Outpatient Follow-up:  1. Follow-up syncope secondary to nadolol, in the context of prolonged QTc  Follow-up Information    Sabrina Meredith Leeds, MD. Schedule an appointment as soon as possible for a visit.   Specialty:  Cardiology Contact information: [place] [address] [phone]          Discharge Diagnoses:  1. Syncope secondary to nadolol 2. Prolonged QTc 3. Hyperkalemia 4. Severe pulmonary hypertension  Discharge Condition: improved Disposition: home  Diet recommendation: diabetic diet  Filed Weights   07/30/16 1219  Weight: 92.1 kg (203 lb)    History of present illness:  55 year old woman PMH prolonged QTC followed by electrophysiology, pulmonary hypertension, undifferentiated connective tissue disease, seen by electrophysiology December 15 for prolonged QTC and started on nadolol.  After taking one dose she had syncope. Came to the emergency department, plans were made for discharge but had a second episode of syncope and therefore was brought in for observation.  Hospital Course:  Observed in the hospital. No further syncope. No arrhythmias noted. Seen by cardiology with recommendation to discontinue atenolol. Further workup and follow-up deferred to the outpatient setting, recommendation to follow up with electrophysiology as an outpatient. Hospitalization was uncomplicated. Individual issues as below.  1. Syncope secondary to nadolol in the context of prolonged QT. This medication has been discontinued. Continue to follow the outpatient setting with electrophysiologist. 2. Prolonged QT, stable. 3. Hyperkalemia after supplementation. Resolved. 4. Severe pulmonary hypertension. Stable. 5. Undifferentiated connective tissue  disease 6. Diabetes mellitus, stable 7. Mild orthostatic hypotension. Asymptomatic.  Consultants:  Cardiology  Discharge Instructions  Discharge Instructions    Diet Carb Modified    Complete by:  As directed    Discharge instructions    Complete by:  As directed    Call your physician or seek immediate medical attention for passing out, shortness of breath, dizziness, lightheadedness or worsening of condition.   Increase activity slowly    Complete by:  As directed      Allergies as of 08/01/2016      Reactions   Peanut-containing Drug Products Nausea And Vomiting   Potassium Iodide Other (See Comments)   facial swelling, also eyes, and ears   Ace Inhibitors Cough   Codeine Nausea And Vomiting   Doxycycline Hives   Amlodipine Rash   Latex Hives, Rash   Penicillins Hives, Itching, Rash   .Marland KitchenHas patient had a PCN reaction causing immediate rash, facial/tongue/throat swelling, SOB or lightheadedness with hypotension: No Has patient had a PCN reaction causing severe rash involving mucus membranes or skin necrosis: No Has patient had a PCN reaction that required hospitalization No Has patient had a PCN reaction occurring within the last 10 years: YES (3 years ago)  If all of the above answers are "NO", then may proceed with Cephalosporin use.      Medication List    STOP taking these medications   nadolol 40 MG tablet Commonly known as:  CORGARD     TAKE these medications   acetaminophen 500 MG tablet Commonly known as:  TYLENOL Take 500 mg by mouth every 6 (six) hours as needed for headache.   ADCIRCA 20 MG tablet Generic drug:  tadalafil (PAH) Take 40 mg by mouth daily.   aspirin EC 81 MG tablet Take 1 tablet (81 mg total) by  mouth daily.   benzonatate 100 MG capsule Commonly known as:  TESSALON PERLES Take 1 capsule (100 mg total) by mouth 2 (two) times daily as needed for cough.   ergocalciferol 50000 units capsule Commonly known as:  VITAMIN D2 Take 1  capsule (50,000 Units total) by mouth once a week. What changed:  additional instructions   furosemide 40 MG tablet Commonly known as:  LASIX Take 1 tablet (40 mg total) by mouth daily.   guaiFENesin-dextromethorphan 100-10 MG/5ML syrup Commonly known as:  ROBITUSSIN DM Take 5 mLs by mouth every 6 (six) hours as needed for cough.   hydroxychloroquine 200 MG tablet Commonly known as:  PLAQUENIL Take 200 mg by mouth 2 (two) times daily.   LETAIRIS 5 MG tablet Generic drug:  ambrisentan Take 5 mg by mouth daily.   montelukast 10 MG tablet Commonly known as:  SINGULAIR Take 1 tablet (10 mg total) by mouth at bedtime. What changed:  when to take this  reasons to take this   multivitamin with minerals Tabs tablet Take 1 tablet by mouth daily.   nitroGLYCERIN 0.4 MG SL tablet Commonly known as:  NITROSTAT Place 0.4 mg under the tongue every 5 (five) minutes as needed for chest pain (3 DOSES MAX).   ondansetron 4 MG disintegrating tablet Commonly known as:  ZOFRAN ODT One tablet daily, as needed , for nusea What changed:  how much to take  how to take this  when to take this  reasons to take this  additional instructions   pantoprazole 40 MG tablet Commonly known as:  PROTONIX Take 1 tablet (40 mg total) by mouth daily.   potassium chloride SA 20 MEQ tablet Commonly known as:  K-DUR,KLOR-CON Take 1 tablet (20 mEq total) by mouth 2 (two) times daily. What changed:  how much to take  when to take this      Allergies  Allergen Reactions  . Peanut-Containing Drug Products Nausea And Vomiting  . Potassium Iodide Other (See Comments)    facial swelling, also eyes, and ears  . Ace Inhibitors Cough  . Codeine Nausea And Vomiting  . Doxycycline Hives  . Amlodipine Rash  . Latex Hives and Rash  . Penicillins Hives, Itching and Rash    .Marland KitchenHas patient had a PCN reaction causing immediate rash, facial/tongue/throat swelling, SOB or lightheadedness with  hypotension: No Has patient had a PCN reaction causing severe rash involving mucus membranes or skin necrosis: No Has patient had a PCN reaction that required hospitalization No Has patient had a PCN reaction occurring within the last 10 years: YES (3 years ago)  If all of the above answers are "NO", then may proceed with Cephalosporin use.     The results of significant diagnostics from this hospitalization (including imaging, microbiology, ancillary and laboratory) are listed below for reference.    Significant Diagnostic Studies: Dg Chest Portable 1 View  Result Date: 07/30/2016 CLINICAL DATA:  Syncope. EXAM: PORTABLE CHEST 1 VIEW COMPARISON:  Radiographs of July 06, 2016. FINDINGS: Stable cardiomediastinal silhouette. No pneumothorax or pleural effusion is noted. Both lungs are clear. The visualized skeletal structures are unremarkable. IMPRESSION: No acute cardiopulmonary abnormality seen. Electronically Signed   By: Marijo Conception, M.D.   On: 07/30/2016 13:08   Labs: Basic Metabolic Panel:  Recent Labs Lab 07/30/16 1238 07/30/16 1305 07/31/16 0332 08/01/16 0458  NA 140  --  140 138  K 3.0*  --  5.8* 3.4*  CL 103  --  109 108  CO2 24  --  23 21*  GLUCOSE 148*  --  94 101*  BUN 18  --  17 20  CREATININE 1.08*  --  1.04* 1.12*  CALCIUM 7.2*  --  7.1* 7.2*  MG  --  2.2  --   --    Liver Function Tests:  Recent Labs Lab 07/30/16 1238 07/31/16 0332  AST 61* 37  ALT 28 25  ALKPHOS 61 57  BILITOT 0.7 0.5  PROT 7.6 7.2  ALBUMIN 3.3* 3.3*   CBC:  Recent Labs Lab 07/30/16 1238 07/31/16 0332  WBC 14.2* 10.3  NEUTROABS 9.2*  --   HGB 12.7 12.3  HCT 39.9 38.9  MCV 88.3 88.0  PLT 318 283   Cardiac Enzymes:  Recent Labs Lab 07/30/16 2105 07/31/16 0332 07/31/16 1048  TROPONINI <0.03 <0.03 <0.03     Recent Labs  03/16/16 1023 07/06/16 1409 07/30/16 1238  BNP 409.5* 740.1* 355.0*     Principal Problem:   Syncope Active Problems:   Diabetes  mellitus with renal manifestation ([place])   Pulmonary hypertension   Prolonged QT syndrome   Hypocalcemia   Hypokalemia   Time coordinating discharge: 35 minutes  Signed:  Murray Hodgkins, MD [facility] 08/01/2016, 3:34 PM

## 2016-08-01 NOTE — Progress Notes (Signed)
  PROGRESS NOTE  Sabrina [name] [id number] DOB: Jan 20, 1961 DOA: 07/30/2016 PCP: [name], MD [name], MD Collene Gobble, MD  Brief Narrative: 55 year old woman PMH prolonged QTC followed by electrophysiology, pulmonary hypertension, undifferentiated connective tissue disease, seen by electrophysiology December 15 for prolonged QTC and started on nadolol.  After taking one dose she had syncope. Came to the emergency department, plans were made for discharge but had a second episode of syncope and therefore was brought in for observation.  Assessment/Plan: 1. Syncope secondary to nadolol in the context of prolonged QT. This medication has been discontinued. Continue to follow the outpatient setting with electrophysiologist. 2. Prolonged QT, stable. 3. Hyperkalemia after supplementation. Resolved. 4. Severe pulmonary hypertension 5. Undifferentiated connective tissue disease 6. Diabetes mellitus, stable 7. Mild orthostatic hypotension.   Doing well. Home today. Nadolol discontinued.  Discussed with husband at bedside.  Murray Hodgkins, MD  [facility] Direct contact: [phone] --Via amion app OR  --[email or web address]; password [email or web address]  7PM-7AM contact night coverage as above 08/01/2016, 3:33 PM  LOS: 0 days   Consultants:  Cardiology  Procedures:    Antimicrobials:    Interval history/Subjective: Feeling well, breathing well, no pain, no dizziness, no syncope.  Objective: Vitals:   07/31/16 1406 07/31/16 2121 08/01/16 0701 08/01/16 1412  BP: 139/89 113/77 (!) 150/92 (!) 130/97  Pulse: 72 71 74 (!) 53  Resp: 18 20 20    Temp: 98.8 F (37.1 C) 97.8 F (36.6 C) 97.6 F (36.4 C) 98.6 F (37 C)  TempSrc: Oral Oral Oral Oral  SpO2: 96% 100% 100% 99%  Weight:      Height:        Intake/Output Summary (Last 24 hours) at 08/01/16 1533 Last data filed at 08/01/16 0830  Gross per 24 hour  Intake             1440 ml  Output                 0 ml  Net             1440 ml     Filed Weights   07/30/16 1219  Weight: 92.1 kg (203 lb)    Exam:    Constitutional:  . Appears calm and comfortable ENMT:  . grossly normal hearing  Respiratory:  . CTA bilaterally, no w/r/r.  . Respiratory effort normal. No retractions or accessory muscle use Cardiovascular:  . RRR, no m/r/g . Telemetry SR Psychiatric:  . Mental status o Mood, affect appropriate  I have personally reviewed following labs and imaging studies:  Potassium 3.4, remainder BMP unremarkable  Scheduled Meds: . aspirin EC  81 mg Oral Daily  . enoxaparin (LOVENOX) injection  40 mg Subcutaneous Q24H  . montelukast  10 mg Oral QHS  . potassium chloride SA  40 mEq Oral Daily   Continuous Infusions: . sodium chloride 50 mL/hr at 07/31/16 2105    Principal Problem:   Syncope Active Problems:   Diabetes mellitus with renal manifestation ([facility])   Pulmonary hypertension   Prolonged QT syndrome   Hypocalcemia   Hypokalemia   LOS: 0 days

## 2016-08-03 ENCOUNTER — Other Ambulatory Visit: Payer: Self-pay | Admitting: Family Medicine

## 2016-08-03 ENCOUNTER — Telehealth: Payer: Self-pay | Admitting: Cardiology

## 2016-08-03 DIAGNOSIS — R0602 Shortness of breath: Secondary | ICD-10-CM

## 2016-08-03 DIAGNOSIS — R55 Syncope and collapse: Secondary | ICD-10-CM

## 2016-08-03 NOTE — Telephone Encounter (Signed)
Pt c/o medication issue:  1. Name of Medication: [place] seeing on her med list  2. How are you currently taking this medication (dosage and times per day)? Took first dosage on Monday am-passed out, had vomiting, sweating-at work they called 911  3. Are you having a reaction (difficulty breathing--STAT)? Passed out a total of 4 times Monday and once on Tuesday, stayed in hospital until Wednesday evening  4. What is your medication issue? Can't take the med  pls advise [phone]

## 2016-08-03 NOTE — Telephone Encounter (Signed)
Left message for patient to call back  

## 2016-08-07 NOTE — Telephone Encounter (Signed)
Called patient back about her message. Patient stated that she still gets SOB, but she is slowly improving. Patient stated since her discharge from the hospital she has not passed out. Patient is not taking Nadalol and has an appointment on Friday with Dr. Curt Bears. Encouraged patient to give our office a call if she started feeling worse and to keep her appointment on Friday. Patient verbalized understanding and had no other questions or concerns at this time.

## 2016-08-10 ENCOUNTER — Encounter: Payer: Self-pay | Admitting: Cardiology

## 2016-08-10 ENCOUNTER — Ambulatory Visit (INDEPENDENT_AMBULATORY_CARE_PROVIDER_SITE_OTHER): Payer: 59 | Admitting: Cardiology

## 2016-08-10 VITALS — BP 110/78 | HR 79 | Ht 60.0 in | Wt 201.4 lb

## 2016-08-10 DIAGNOSIS — I4581 Long QT syndrome: Secondary | ICD-10-CM | POA: Diagnosis not present

## 2016-08-10 DIAGNOSIS — R55 Syncope and collapse: Secondary | ICD-10-CM

## 2016-08-10 NOTE — Progress Notes (Signed)
Electrophysiology Office Note   Date:  08/10/2016   ID:  Sabrina, [name] Apr 13, 1961, MRN [id number]  PCP:  [name], MD  Cardiologist:  Radford Pax Primary Electrophysiologist:  Jozi Malachi Meredith Leeds, MD    Chief Complaint  Patient presents with  . Follow-up    syncope     History of Present Illness: Sabrina [name] is a 55 y.o. female who presents today for electrophysiology evaluation.   Hx of morbid obesity, HTN, HL, GERD, DM2. She had a recent hospitalization for acute hypoxic respiratoyr failure, pulmonary hypertension, diastolic HF. 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 She started the medications last weekend, but had significant GI symptoms as well as headaches. She also has long QT syndrome with a QTc of 523.  She presented to the hospital with episodes of syncope on 12/19. She had taken one episode of metal which was associated with diaphoresis and severe nausea. She went to the emergency room, when she stood up to leave, after feeling better, she had a second syncopal event. She said that within one hour of starting the atenolol, she felt dizzy and lightheaded.  Today, she denies symptoms of palpitations, chest pain, shortness of breath, orthopnea, PND, lower extremity edema, claudication, dizziness, presyncope, syncope, bleeding, or neurologic sequela. The patient is tolerating medications without difficulties and is otherwise without complaint today. Since leaving the hospital, she felt much improved. On December 24, she felt back to her normal self and has been doing her normal activity since that time.   Past Medical History:  Diagnosis Date  . Allergy   . Anemia   . Arthritis   . Asthma   . Chronic bronchitis ([place])   . Diabetes mellitus   . Endometriosis   . Fibroid uterus   . GERD (gastroesophageal reflux disease)   . History of Doppler ultrasound    a. Carotid US  8/08:Estimated stenosis in the right and left internal carotid arteries is 0-50% and 0-50%.  . History of echocardiogram    a. Echo 6/16: EF 60-65%, no RWMA.  . History of hiatal hernia   . History of nuclear stress test    a. Myoview 6/16: There is no resting or stress perfusion defect consistent with no prior infarct and no ischemia. EF 73 %  The patient was hypertensive prior and during the study. Cardiac cath 04/2016 showed normal coronary arteries  . Hx of iron deficiency anemia   . Hypersomnia with sleep apnea   . Hypertension   . 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 Menorrhagia   . Obesity   . Pneumonia   . 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 Secondary hyperthyroidism   . Thyroid disease   . Tinnitus    left ear  . Vertigo   . Vitamin D deficiency    Past Surgical History:  Procedure Laterality Date  . ABDOMINAL HYSTERECTOMY  2004   supracervical hysterectomy  . BREAST SURGERY  2005   reduction  . BREATH TEK H PYLORI [name] 12/24/2014   Procedure: BREATH TEK H PYLORI;  Surgeon: Greer Pickerel, MD;  Location: [facility];  Service: General;  Laterality: [name];  . CARDIAC CATHETERIZATION [name] 03/22/2016   Procedure: Right/Left Heart Cath and Coronary Angiography;  Surgeon: Troy Sine, MD;  Location: [facility];  Service: Cardiovascular;  Laterality: [name];  . DILATION AND CURETTAGE OF UTERUS     age 74  .  ESOPHAGOGASTRODUODENOSCOPY (EGD) WITH PROPOFOL [name] 10/14/2015   Procedure: ESOPHAGOGASTRODUODENOSCOPY (EGD) WITH PROPOFOL;  Surgeon: Clarene Essex, MD;  Location: [facility];  Service: Endoscopy;  Laterality: [name];  . LAPAROSCOPIC GASTRIC SLEEVE RESECTION WITH HIATAL HERNIA REPAIR [name] 07/05/2015   Procedure: LAPAROSCOPIC GASTRIC SLEEVE RESECTION WITH HIATAL HERNIA REPAIR, upper endoscopy;  Surgeon: Greer Pickerel, MD;  Location: [facility];  Service: General;  Laterality: [name];     Current Outpatient Prescriptions  Medication Sig Dispense Refill  . acetaminophen (TYLENOL) 500 MG tablet Take  500 mg by mouth every 6 (six) hours as needed for headache.    [name] ambrisentan (LETAIRIS) 5 MG tablet Take 5 mg by mouth daily.    [name] aspirin EC 81 MG tablet Take 1 tablet (81 mg total) by mouth daily.    . benzonatate (TESSALON PERLES) 100 MG capsule Take 1 capsule (100 mg total) by mouth 2 (two) times daily as needed for cough. 20 capsule 0  . ergocalciferol (VITAMIN D2) 50000 units capsule Take 1 capsule (50,000 Units total) by mouth once a week. (Patient taking differently: Take 50,000 Units by mouth once a week. On Monday.) 4 capsule 3  . furosemide (LASIX) 40 MG tablet Take 1 tablet (40 mg total) by mouth daily. 30 tablet 11  . guaiFENesin-dextromethorphan (ROBITUSSIN DM) 100-10 MG/5ML syrup Take 5 mLs by mouth every 6 (six) hours as needed for cough. 118 mL 0  . hydroxychloroquine (PLAQUENIL) 200 MG tablet Take 200 mg by mouth 2 (two) times daily.    . montelukast (SINGULAIR) 10 MG tablet Take 1 tablet (10 mg total) by mouth at bedtime. (Patient taking differently: Take 10 mg by mouth at bedtime as needed (allergies). ) 30 tablet 3  . Multiple Vitamin (MULTIVITAMIN WITH MINERALS) TABS tablet Take 1 tablet by mouth daily.    . nitroGLYCERIN (NITROSTAT) 0.4 MG SL tablet Place 0.4 mg under the tongue every 5 (five) minutes as needed for chest pain (3 DOSES MAX).    [name] ondansetron (ZOFRAN ODT) 4 MG disintegrating tablet One tablet daily, as needed , for nusea (Patient taking differently: Take 4 mg by mouth every 6 (six) hours as needed for nausea or vomiting. One tablet daily, as needed , for nusea) 30 tablet 2  . pantoprazole (PROTONIX) 40 MG tablet Take 1 tablet (40 mg total) by mouth daily. 30 tablet 6  . potassium chloride SA (K-DUR,KLOR-CON) 20 MEQ tablet Take 1 tablet (20 mEq total) by mouth 2 (two) times daily. (Patient taking differently: Take 40 mEq by mouth daily. ) 60 tablet 6  . tadalafil, PAH, (ADCIRCA) 20 MG tablet Take 40 mg by mouth daily.     No current facility-administered  medications for this visit.     Allergies:   Peanut-containing drug products; Potassium iodide; Ace inhibitors; Codeine; Doxycycline; Amlodipine; Latex; and Penicillins   Social History:  The patient  reports that she has never smoked. She has never used smokeless tobacco. She reports that she does not drink alcohol or use drugs.   Family History:  The patient's family history includes Arthritis in her father, mother, and sister; Bleeding Disorder in her sister; Breast cancer in her sister; CAD in her mother; Cancer in her father; Colon cancer in her maternal grandfather; Depression in her father, mother, and sister; Diabetes in her father and mother; Hypertension in her father, mother, and sister; Stroke in her father and maternal aunt; Thyroid disease in her sister.    ROS:  Please see the history of present illness.  Otherwise, review of systems is positive for sweating, fatigue, nausea vomiting, anxiety, headaches, dizziness, syncope.   All other systems are reviewed and negative.    PHYSICAL EXAM: VS:  BP 110/78   Pulse 79   Ht 5' (1.524 m)   Wt 201 lb 6.4 oz (91.4 kg)   LMP 10/31/2002   BMI 39.33 kg/m  , BMI Body mass index is 39.33 kg/m. GEN: Well nourished, well developed, in no acute distress  HEENT: normal  Neck: no JVD, carotid bruits, or masses Cardiac: RRR; no murmurs, rubs, or gallops,no edema  Respiratory:  clear to auscultation bilaterally, normal work of breathing GI: soft, nontender, nondistended, + BS MS: no deformity or atrophy  Skin: warm and dry Neuro:  Strength and sensation are intact Psych: euthymic mood, full affect  EKG:  EKG is ordered today. Personal review of the ekg ordered shows sinus rhythm, RVH, prolonged QT, QTC 548  Recent Labs: 07/30/2016: B Natriuretic Peptide 355.0; Magnesium 2.2 07/31/2016: ALT 25; Hemoglobin 12.3; Platelets 283 08/01/2016: BUN 20; Creatinine, Ser 1.12; Potassium 3.4; Sodium 138    Lipid Panel     Component Value  Date/Time   CHOL 111 (L) 03/10/2016 1038   TRIG 83 03/10/2016 1038   HDL 32 (L) 03/10/2016 1038   CHOLHDL 3.5 03/10/2016 1038   VLDL 17 03/10/2016 1038   LDLCALC 62 03/10/2016 1038     Wt Readings from Last 3 Encounters:  08/10/16 201 lb 6.4 oz (91.4 kg)  07/30/16 203 lb (92.1 kg)  07/27/16 203 lb (92.1 kg)      Other studies Reviewed: Additional studies/ records that were reviewed today include: TTE 03/22/16, Cath 03/22/16  Review of the above records today demonstrates:  - Left ventricle: The cavity size was normal. Systolic function was   normal. The estimated ejection fraction was in the range of 50%   to 55%. Wall motion was normal; there were no regional wall   motion abnormalities. Doppler parameters are consistent with   abnormal left ventricular relaxation (grade 1 diastolic   dysfunction). Doppler parameters are consistent with high   ventricular filling pressure. - Ventricular septum: The contour showed diastolic flattening and   systolic flattening consistent with right ventricular pressure   and volume overload. - Aortic valve: Transvalvular velocity was within the normal range.   There was no stenosis. There was no regurgitation. - Mitral valve: There was trivial regurgitation. - Right ventricle: The cavity size was normal. Wall thickness was   normal. Systolic function was reduced. - Tricuspid valve: There was mild regurgitation. - Pulmonary arteries: Systolic pressure was severely increased. PA   peak pressure: 91 mm Hg (S). - Pericardium, extracardiac: A trivial pericardial effusion was   identified.   The left ventricular systolic function is normal.  The left ventricular ejection fraction is 55-65% by visual estimate.  Hemodynamic findings consistent with severe pulmonary hypertension.   Severe right ventricular systolic pressure and pulmonary hypertension with systolic pressures greater than 100 mmHg.  Significant pulmonary capillary/LV gradient,  without previous evidence of mitral stenosis on echocardiography.  Normal coronary arteries.  Normal LV systolic function with left ventricular hypertrophy.   ASSESSMENT AND PLAN:  1. Acute CHF - Echo in 2016 with normal LVEF.  Currently appears to be well compensated. Is not having any further issues with shortness of breath at the moment.  2. Chest pain  - No evidence of coronary artery disease.  3. HTN - Well controlled today  4. Prolonged QT - remains  lengthened on multiple EKGs, and has had episodes of syncope while on nodolol. It was deemed that she would not tolerate this medication. Due to her multiple issues, we'll place a 48 hour monitor to see if she is having any arrhythmias or PVCs. She may tolerate different beta blocker in the future such as a more cardioselective beta blocker. We'll review her monitor prior to prescribing any medications. We Yohanna Tow also give her a website for drugs to avoid for long QT syndrome.  5. Pulmonary hypertension: Follow-up planned with pulmonary.  Current medicines are reviewed at length with the patient today.   The patient does not have concerns regarding her medicines.  The following changes were made today:  nadolol  Labs/ tests ordered today include:  No orders of the defined types were placed in this encounter.    Disposition:   FU with Shawntina Diffee 1 months  Signed, Annaliza Zia Meredith Leeds, MD  08/10/2016 8:14 AM     [facility] [address] [place] [address] [phone] (office) [phone] (fax)

## 2016-08-10 NOTE — Patient Instructions (Signed)
Medication Instructions:    Your physician recommends that you continue on your current medications as directed. Please refer to the Current Medication list given to you today.  --- If you need a refill on your cardiac medications before your next appointment, please call your pharmacy. ---  Labwork:  None ordered  Testing/Procedures: Your physician has recommended that you wear a 48 hour holter monitor. Holter monitors are medical devices that record the heart's electrical activity. Doctors most often use these monitors to diagnose arrhythmias. Arrhythmias are problems with the speed or rhythm of the heartbeat. The monitor is a small, portable device. You can wear one while you do your normal daily activities. This is usually used to diagnose what is causing palpitations/syncope (passing out).  Follow-Up:  Your physician recommends that you schedule a follow-up appointment in: 1 month with Dr. Curt Bears  Any Other Special Instructions Will Be Listed Below (If Applicable).  Search for Long QT medications  LongQT medication list given to you today   Thank you for choosing [facility]!!   Trinidad Curet, RN [phone]    Long QT Syndrome Introduction Long QT syndrome (LQTS) is a heart condition in which the heart's electrical system is abnormal. It happens when the heart takes longer than normal to recharge after each heartbeat. LQTS can upset the timing of your heartbeats. It can cause dangerous changes in your heart rate and rhythm (arrhythmia). There are several types of LQTS. The three most common types are:  Type 1. This can be triggered by stress or exercise, especially swimming.  Type 2. This can be triggered by strong emotions or surprise.  Type 3. This can be triggered when your heart slows during sleep. You can be born with LQTS or develop it later in life. What are the causes? LQTS that a person is born with (inherited LQTS) is caused by an abnormal gene that  is passed down through the family. LQTS that a person is not born with (acquired LQTS) may be caused by certain medicines that affect the heartbeat and by medicines and conditions that cause a loss of sodium and potassium. Medicines that may cause acquired LQTS include:  Antidepressants and psychotropic drugs.  Antiarrhythmics.  Antibiotic, antifungal, and antiviral medicines.  Gastrointestinal medicines.  Diuretics.  High blood pressure medicines.  Cholesterol-lowering medicines.  Migraine medicines. Conditions that may cause acquired LQTS include:  Long periods of vomiting or diarrhea.  An eating disorder.  A thyroid disorder. What increases the risk? This condition is more likely to develop in:  People who are born deaf.  Women.  People who have an eating disorder, such as anorexia nervosa or bulimia.  People who have a family member with LQTS.  People who have a family history of unexplained fainting, drowning, or sudden death. What are the signs or symptoms? Symptoms of this condition include:  Fainting.  A fluttering feeling in your chest.  Loud gasping during sleep.  Seizures. Symptoms of inherited LQTS almost always start before age 50. Some people with this condition do not have any symptoms. How is this diagnosed? This condition may be diagnosed based on:  Your symptoms.  Your medical history and family history.  A physical exam.  Tests, including:  An electrocardiogram (ECG). This test measures electrical activity in your heart.  Holter monitoring. For this test, you wear a device that records your heartbeat for 1-2 days.  Stress test. This records your heartbeat while you exercise or after you take a medicine that  makes the heart beat more quickly.  A blood test to look for genes that cause LQTS. How is this treated? There is no cure for this condition. Treatment depends on the cause, your symptoms, and whether you have a family history of  sudden death. Treatment may include:  Making lifestyle changes, such as avoiding competitive sports or stressful situations.  Correcting abnormal salt (sodium) and potassium levels, such as by taking supplements.  Stopping the use of a medicine as told by your health care provider.  Taking heart medicines, such as beta blockers.  Having a pacemaker implanted in your body. A pacemaker is a device that helps the heart to beat in a normal rhythm.  Having a cardioverter-defibrillator implanted in your body. A cardioverter-defibrillator is a device that shocks your heart when it beats too quickly. The shock helps to change a fast heartbeat into a normal heartbeat.  Having heart surgery to prevent arrhythmias. Follow these instructions at home: Medicine  Take over-the-counter and prescription medicines only as told by your health care provider.  If you want to take any new medicine, get approval from your health care provider first. Avoid any medicines that can cause this condition. General instructions  Make any lifestyle changes that are recommended by your health care provider. You may need to avoid:  Competitive sports.  Strenuous exercises and activities, such as swimming.  Stress.  Situations where sudden loud noises are likely.  Develop a plan with your health care provider for how to deal with a sudden arrhythmia.  Tell people who live with you about the signs of a sudden arrhythmia.  Wear a medical ID necklace or bracelet that states your diagnosis and contact information.  Have an automated external defibrillator (AED) available at home or work.  Get treatment and support if you feel stress, fear, anxiety, or depression. Contact a health care provider if:  You are suffering from stress, fear, anxiety, or depression.  You vomit.  You have diarrhea. Get help right away if:  You have chest pain or difficulty breathing.  You have a fluttering feeling in your  chest.  You faint.  You have a seizure. This information is not intended to replace advice given to you by your health care provider. Make sure you discuss any questions you have with your health care provider. Document Released: 05/27/2009 Document Revised: 01/05/2016 Document Reviewed: 02/11/2015  2017 Elsevier     Holter Monitoring Introduction A Holter monitor is a small device that is used to detect abnormal heart rhythms. It clips to your clothing and is connected by wires to flat, sticky disks (electrodes) that attach to your chest. It is worn continuously for 24-48 hours. Follow these instructions at home:  Wear your Holter monitor at all times, even while exercising and sleeping, for as long as directed by your health care provider.  Make sure that the Holter monitor is safely clipped to your clothing or close to your body as recommended by your health care provider.  Do not get the monitor or wires wet.  Do not put body lotion or moisturizer on your chest.  Keep your skin clean.  Keep a diary of your daily activities, such as walking and doing chores. If you feel that your heartbeat is abnormal or that your heart is fluttering or skipping a beat:  Record what you are doing when it happens.  Record what time of day the symptoms occur.  Return your Holter monitor as directed by your health care  provider.  Keep all follow-up visits as directed by your health care provider. This is important. Get help right away if:  You feel lightheaded or you faint.  You have trouble breathing.  You feel pain in your chest, upper arm, or jaw.  You feel sick to your stomach and your skin is pale, cool, or damp.  You heartbeat feels unusual or abnormal. This information is not intended to replace advice given to you by your health care provider. Make sure you discuss any questions you have with your health care provider. Document Released: 04/27/2004 Document Revised: 01/05/2016  Document Reviewed: 03/08/2014  2017 Elsevier

## 2016-08-15 DIAGNOSIS — R55 Syncope and collapse: Secondary | ICD-10-CM | POA: Diagnosis not present

## 2016-08-17 ENCOUNTER — Ambulatory Visit (INDEPENDENT_AMBULATORY_CARE_PROVIDER_SITE_OTHER): Payer: 59

## 2016-08-17 DIAGNOSIS — R55 Syncope and collapse: Secondary | ICD-10-CM

## 2016-08-23 ENCOUNTER — Other Ambulatory Visit: Payer: Self-pay | Admitting: Family Medicine

## 2016-09-07 ENCOUNTER — Encounter: Payer: Self-pay | Admitting: Emergency Medicine

## 2016-09-07 ENCOUNTER — Ambulatory Visit (INDEPENDENT_AMBULATORY_CARE_PROVIDER_SITE_OTHER): Payer: 59 | Admitting: Emergency Medicine

## 2016-09-07 DIAGNOSIS — I272 Pulmonary hypertension, unspecified: Secondary | ICD-10-CM

## 2016-09-07 NOTE — Addendum Note (Signed)
Addended by: Tyson Dense on: 09/07/2016 09:22 AM   Modules accepted: Orders

## 2016-09-07 NOTE — Progress Notes (Signed)
Subjective:    Patient ID: Sabrina [name], female    DOB: 1961/01/03, 56 y.o.   MRN: [id number]  HPI Sabrina [name] is a 56 year old never smoker with a history of diastolic CHF, systemic hypertension, diabetes, obesity with ?? obstructive sleep apnea. She had a sleep study at [facility] before her gastric sgy. With a history of possible Lupus followed at [facility] and locally by Dr Tonia Brooms. She was seen by Rheum and it sounds like she was serology negative. Underwent gastric sleeve for wt loss (2016) by Dr Redmond Pulling, well tolerated. She took Phen-Fen for about 6 months, about 2 years. Former MTX use  She has been under evaluation for her CHF and is followed at [facility]. She has been on lasix for the last month or so, feels that her breathing is better.  She underwent left and right cardiac catheterization 03/22/16 as detailed below, shows significant elevation PA pressures. No MV or TV disease noted.   ROV 06/22/16 -- This follow-up visit for evaluation of severe pulmonary hypertension. She has a history as outlined above that includes diastolic CHF, suspected obstructive sleep apnea, possible connective tissue disease, and diet pill use. We have performed testing since last visit. She has had a low probability ventilation/perfusion scan 06/04/16, and normal chest x-ray. She has a positive ANA from 05/25/16 with a titer of 1:1280, centromeric pattern. Rheumatoid factor elevated at 36. Home sleep study is pending. Walking oximetry last time was normal on RA.   She has been having dry cough, may be due to increased allergy sx. She does not feel exertional SOB, is able to exert. She takes stairs slower but is able to climb. She has seen Dr Lavonna Monarch and was just started on hydroxychloriquine this week, for skin symptoms.    ROV 07/27/16 -- This is a follow-up visit for severe pulmonary hypertension in the setting of diastolic CHF, newly identified connective tissue disease. She also has a history of  diet pill use in the remote past. No evidence to support chronic thromboembolic disease. Since our last visit she has been started on [place]. 6 minute walk was performed today. She was able to ambulate 291 m without any evidence of desaturation.  She had significant side effects when she started both > diarrhea, severe HA. She stopped both on 12/12. She is now on Plaquenil, blood work to be followed by Dr Denna Haggard. She is about to start timolol for prolonged QT-c per cardiology plans.   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 She is on Adcirca 20mg  and Letaris 5mg , now on for about 2.5 months. She still has HA after the Adcirca. She has noticed a significant improvement in her breathing - able to exert much better. Her AHI on home sleep study was 8.8 / hour.    Echo 03/22/16 EF 50-55%, normal wall motion, grade 1 diastolic dysfunction, diastolic and systolic flattening of ventricular septum consistent with RV pressure and volume overload, trivial MR, reduced RVSF, mild TR, PASP 91 mmHg, trivial pericardial effusion; severe pulmonary hypertension  L/RHC 03/22/16  The left ventricular systolic function is normal.  The left ventricular ejection fraction is 55-65% by visual estimate.  Hemodynamic findings consistent with severe pulmonary hypertension.  PASP 114 mmHg Severe right ventricular systolic pressure and pulmonary hypertension with systolic pressures greater than 100 mmHg. Significant pulmonary capillary/LV gradient, without previous evidence of mitral stenosis on echocardiography. Normal coronary arteries. Normal LV  systolic function with left ventricular hypertrophy. RECOMMENDATION: The patient will undergo 2-D echo Doppler study later today prior to being discharged. The etiology of her severe pulmonary hypertension is unknown and consider possible primary pulmonary hypertension. Echo Doppler study will  be helpful to further assess her mitral valve and potential for RV volume/pressure overload  Review of Systems  Constitutional: Negative.  Negative for fever and unexpected weight change.  HENT: Positive for sinus pressure and sneezing. Negative for congestion, dental problem, ear pain, nosebleeds, postnasal drip, rhinorrhea, sore throat and trouble swallowing.   Eyes: Positive for itching. Negative for redness.  Respiratory: Positive for cough, shortness of breath and wheezing. Negative for chest tightness.   Cardiovascular: Negative.  Negative for palpitations and leg swelling.  Gastrointestinal: Negative.  Negative for nausea and vomiting.  Endocrine: Negative.   Genitourinary: Negative.  Negative for dysuria.  Musculoskeletal: Negative.  Negative for joint swelling.  Skin: Negative.  Negative for rash.  Allergic/Immunologic: Positive for environmental allergies.  Neurological: Negative.  Negative for headaches.  Hematological: Bruises/bleeds easily.  Psychiatric/Behavioral: Negative.  Negative for dysphoric mood. The patient is not nervous/anxious.     Past Medical History:  Diagnosis Date  . Allergy   . Anemia   . Arthritis   . Asthma   . Chronic bronchitis ([place])   . Diabetes mellitus   . Endometriosis   . Fibroid uterus   . GERD (gastroesophageal reflux disease)   . History of Doppler ultrasound    a. Carotid US 8/08:Estimated stenosis in the right and left internal carotid arteries is 0-50% and 0-50%.  . History of echocardiogram    a. Echo 6/16: EF 60-65%, no RWMA.  . History of hiatal hernia   . History of nuclear stress test    a. Myoview 6/16: There is no resting or stress perfusion defect consistent with no prior infarct and no ischemia. EF 73 %  The patient was hypertensive prior and during the study. Cardiac cath 04/2016 showed normal coronary arteries  . Hx of iron deficiency anemia   . Hypersomnia with sleep apnea   . Hypertension   . 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 Menorrhagia   . Obesity   . Pneumonia   . 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 Secondary hyperthyroidism   . Thyroid disease   . Tinnitus    left ear  . Vertigo   . Vitamin D deficiency      Family History  Problem Relation Age of Onset  . Hypertension Mother   . Diabetes Mother   . Arthritis Mother   . Depression Mother   . CAD Mother     s/p PCI  . Diabetes Father   . Stroke Father   . Arthritis Father   . Depression Father   . Hypertension Father   . Cancer Father     prostate  . Hypertension Sister     x 2  . Breast cancer Sister   . Arthritis Sister   . Bleeding Disorder Sister   . Depression Sister   . Thyroid disease Sister   . Colon cancer Maternal Grandfather   . Stroke Maternal Aunt   . Heart attack Neg Hx     Works as a Engineer, production, desk job.   Social History   Social History  . Marital status: Married    Spouse name: [name]  . Number of children: [name]  . Years of education: 58   Occupational History  . Housing  Specialist [employer]   Social History Main Topics  . Smoking status: Never Smoker  . Smokeless tobacco: Never Used  . Alcohol use No  . Drug use: No  . Sexual activity: Yes    Partners: Male    Birth control/ protection: Surgical     Comment: hysterectomy   Other Topics Concern  . Not on file   Social History Narrative   Regular exercise-no   Caffeine Use-no      [place] [facility]: Patient lives with her husband. Works for [employer]. No bird or mold exposure. No pets currently.              Allergies  Allergen Reactions  . Peanut-Containing Drug Products Nausea And Vomiting  . Potassium Iodide Other (See Comments)    facial swelling, also eyes, and ears  . Ace Inhibitors Cough  . Codeine Nausea And Vomiting  . Doxycycline Hives  . Amlodipine Rash  . Latex Hives and Rash  . Penicillins Hives, Itching and Rash    .Marland KitchenHas patient had a PCN reaction causing immediate rash,  facial/tongue/throat swelling, SOB or lightheadedness with hypotension: No Has patient had a PCN reaction causing severe rash involving mucus membranes or skin necrosis: No Has patient had a PCN reaction that required hospitalization No Has patient had a PCN reaction occurring within the last 10 years: YES (3 years ago)  If all of the above answers are "NO", then may proceed with Cephalosporin use.      Outpatient Medications Prior to Visit  Medication Sig Dispense Refill  . acetaminophen (TYLENOL) 500 MG tablet Take 500 mg by mouth every 6 (six) hours as needed for headache.    [name] ambrisentan (LETAIRIS) 5 MG tablet Take 5 mg by mouth daily.    [name] aspirin EC 81 MG tablet Take 1 tablet (81 mg total) by mouth daily.    . benzonatate (TESSALON PERLES) 100 MG capsule Take 1 capsule (100 mg total) by mouth 2 (two) times daily as needed for cough. 20 capsule 0  . ergocalciferol (VITAMIN D2) 50000 units capsule Take 1 capsule (50,000 Units total) by mouth once a week. (Patient taking differently: Take 50,000 Units by mouth once a week. On Monday.) 4 capsule 3  . furosemide (LASIX) 40 MG tablet Take 1 tablet (40 mg total) by mouth daily. 30 tablet 11  . guaiFENesin-dextromethorphan (ROBITUSSIN DM) 100-10 MG/5ML syrup Take 5 mLs by mouth every 6 (six) hours as needed for cough. 118 mL 0  . hydroxychloroquine (PLAQUENIL) 200 MG tablet Take 200 mg by mouth 2 (two) times daily.    . montelukast (SINGULAIR) 10 MG tablet Take 1 tablet (10 mg total) by mouth at bedtime. (Patient taking differently: Take 10 mg by mouth at bedtime as needed (allergies). ) 30 tablet 3  . Multiple Vitamin (MULTIVITAMIN WITH MINERALS) TABS tablet Take 1 tablet by mouth daily.    . nitroGLYCERIN (NITROSTAT) 0.4 MG SL tablet Place 0.4 mg under the tongue every 5 (five) minutes as needed for chest pain (3 DOSES MAX).    [name] ondansetron (ZOFRAN ODT) 4 MG disintegrating tablet One tablet daily, as needed , for nusea (Patient taking  differently: Take 4 mg by mouth every 6 (six) hours as needed for nausea or vomiting. One tablet daily, as needed , for nusea) 30 tablet 2  . pantoprazole (PROTONIX) 40 MG tablet Take 1 tablet (40 mg total) by mouth daily. 30 tablet 6  . potassium chloride SA (K-DUR,KLOR-CON) 20 MEQ  tablet Take 1 tablet (20 mEq total) by mouth 2 (two) times daily. (Patient taking differently: Take 40 mEq by mouth daily. ) 60 tablet 6  . tadalafil, PAH, (ADCIRCA) 20 MG tablet Take 40 mg by mouth daily.     No facility-administered medications prior to visit.         Objective:   Physical Exam Vitals:   09/07/16 0900  BP: (!) 102/58  Pulse: 75  SpO2: 98%  Weight: 204 lb 9.6 oz (92.8 kg)  Height: 5' (1.524 m)   Gen: Pleasant, overwt woman, in no distress,  normal affect  ENT: No lesions,  mouth clear,  oropharynx clear, no postnasal drip  Neck: No JVD, no TMG, no carotid bruits  Lungs: No use of accessory muscles, clear without rales or rhonchi  Cardiovascular: RRR, heart sounds normal, no murmur or gallops, no peripheral edema  Musculoskeletal: No deformities, no cyanosis or clubbing  Neuro: alert, non focal  Skin: Warm, no lesions or rashes      Assessment & Plan:  Pulmonary hypertension We will continue Letaris 5mg  and Adcirca 20mg  for now. Let us know in 1 month if her headaches are improving. If not then we may need to stop the Adcirca to see if the side effects resolved. We will check an echocardiogram and 6-minute walk in March 2018.  Follow with Dr Lamonte Sakai in March after your testing to review the results.   Baltazar Apo, MD, PhD 09/07/2016, 9:19 AM [facility] [phone] or if no answer [phone]

## 2016-09-07 NOTE — Patient Instructions (Addendum)
We will continue Letaris 5mg  and Adcirca 20mg  for now. Let us know in 1 month if her headaches are improving. If not then we may need to stop the Adcirca to see if the side effects resolved. We will check an echocardiogram and 6-minute walk in March 2018.  Follow with Dr Lamonte Sakai in March after your testing to review the results.

## 2016-09-07 NOTE — Assessment & Plan Note (Signed)
We will continue Letaris 5mg  and Adcirca 20mg  for now. Let us know in 1 month if her headaches are improving. If not then we may need to stop the Adcirca to see if the side effects resolved. We will check an echocardiogram and 6-minute walk in March 2018.  Follow with Dr Lamonte Sakai in March after your testing to review the results.

## 2016-09-14 ENCOUNTER — Encounter: Payer: Self-pay | Admitting: Cardiology

## 2016-09-14 ENCOUNTER — Ambulatory Visit (HOSPITAL_COMMUNITY)
Admission: RE | Admit: 2016-09-14 | Discharge: 2016-09-14 | Disposition: A | Discharge status: Home / Self Care | Payer: 59 | Source: Ambulatory Visit | Attending: Emergency Medicine | Admitting: Emergency Medicine

## 2016-09-14 ENCOUNTER — Ambulatory Visit (INDEPENDENT_AMBULATORY_CARE_PROVIDER_SITE_OTHER): Payer: 59 | Admitting: Cardiology

## 2016-09-14 VITALS — BP 110/68 | HR 88 | Ht 60.0 in | Wt 205.4 lb

## 2016-09-14 DIAGNOSIS — I272 Pulmonary hypertension, unspecified: Secondary | ICD-10-CM | POA: Diagnosis not present

## 2016-09-14 DIAGNOSIS — I10 Essential (primary) hypertension: Secondary | ICD-10-CM | POA: Diagnosis not present

## 2016-09-14 DIAGNOSIS — R9431 Abnormal electrocardiogram [ECG] [EKG]: Secondary | ICD-10-CM | POA: Diagnosis not present

## 2016-09-14 DIAGNOSIS — E119 Type 2 diabetes mellitus without complications: Secondary | ICD-10-CM | POA: Diagnosis not present

## 2016-09-14 DIAGNOSIS — I071 Rheumatic tricuspid insufficiency: Secondary | ICD-10-CM | POA: Insufficient documentation

## 2016-09-14 DIAGNOSIS — K219 Gastro-esophageal reflux disease without esophagitis: Secondary | ICD-10-CM | POA: Insufficient documentation

## 2016-09-14 LAB — ECHOCARDIOGRAM COMPLETE
AVLVOTPG: 7 mmHg
CHL CUP MV DEC (S): 197
CHL CUP STROKE VOLUME: 31 mL
E decel time: 197 msec
EERAT: 11.06
FS: 45 % — AB (ref 28–44)
Height: 60 in
IV/PV OW: 1.03
LA ID, A-P, ES: 38 mm
LA diam index: 1.86 cm/m2
LA vol index: 15.1 mL/m2
LA vol: 30.7 mL
LAVOLA4C: 21.3 mL
LEFT ATRIUM END SYS DIAM: 38 mm
LV E/e' medial: 11.06
LV PW d: 11 mm — AB (ref 0.6–1.1)
LV TDI E'MEDIAL: 5.55
LV dias vol index: 22 mL/m2
LV e' LATERAL: 10.4 cm/s
LV sys vol index: 7 mL/m2
LV sys vol: 14 mL (ref 14–42)
LVDIAVOL: 46 mL (ref 46–106)
LVEEAVG: 11.06
LVOT SV: 60 mL
LVOT VTI: 26.3 cm
LVOT area: 2.27 cm2
LVOT diameter: 17 mm
LVOT peak vel: 136 cm/s
Lateral S' vel: 10 cm/s
MVPG: 5 mmHg
MVPKAVEL: 100 m/s
MVPKEVEL: 115 m/s
RV sys press: 36 mmHg
Reg peak vel: 289 cm/s
Simpson's disk: 69
TAPSE: 19.9 mm
TDI e' lateral: 10.4
TRMAXVEL: 289 cm/s
Weight: 3286.4 oz

## 2016-09-14 NOTE — Progress Notes (Signed)
Electrophysiology Office Note   Date:  09/14/2016   ID:  Walda, Meek 1960-10-18, MRN [id number]  PCP:  [name], MD  Cardiologist:  Radford Pax Primary Electrophysiologist:  Janautica Netzley Meredith Leeds, MD    Chief Complaint  Patient presents with  . Follow-up    Prolonged QT     History of Present Illness: Sabrina [name] is a 56 y.o. female who presents today for electrophysiology evaluation.   Hx of morbid obesity, HTN, HL, GERD, DM2. She had a recent hospitalization for acute hypoxic respiratoyr failure, pulmonary hypertension, diastolic HF. 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 She started the medications last weekend, but had significant GI symptoms as well as headaches. She also has long QT syndrome with a QTc of 523.  She presented to the hospital with episodes of syncope on 12/19.  She went to the emergency room, when she stood up to leave, after feeling better, she had a second syncopal event. She said that within one hour of starting the atenolol, she felt dizzy and lightheaded.  Today, she denies symptoms of palpitations, chest pain, shortness of breath, orthopnea, PND, lower extremity edema, claudication, dizziness, presyncope, syncope, bleeding, or neurologic sequela. The patient is tolerating medications without difficulties and is otherwise without complaint today. He says that she has not been having any problems with palpitations or syncope since last being seen. She is off of her beta blockers at this time.   Past Medical History:  Diagnosis Date  . Allergy   . Anemia   . Arthritis   . Asthma   . Chronic bronchitis ([place])   . Diabetes mellitus   . Endometriosis   . Fibroid uterus   . GERD (gastroesophageal reflux disease)   . History of Doppler ultrasound    a. Carotid US 8/08:Estimated stenosis in the right and left internal carotid arteries is 0-50% and 0-50%.  . History  of echocardiogram    a. Echo 6/16: EF 60-65%, no RWMA.  . History of hiatal hernia   . History of nuclear stress test    a. Myoview 6/16: There is no resting or stress perfusion defect consistent with no prior infarct and no ischemia. EF 73 %  The patient was hypertensive prior and during the study. Cardiac cath 04/2016 showed normal coronary arteries  . Hx of iron deficiency anemia   . Hypersomnia with sleep apnea   . Hypertension   . 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 Menorrhagia   . Obesity   . Pneumonia   . 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 Secondary hyperthyroidism   . Thyroid disease   . Tinnitus    left ear  . Vertigo   . Vitamin D deficiency    Past Surgical History:  Procedure Laterality Date  . ABDOMINAL HYSTERECTOMY  2004   supracervical hysterectomy  . BREAST SURGERY  2005   reduction  . BREATH TEK H PYLORI [name] 12/24/2014   Procedure: BREATH TEK H PYLORI;  Surgeon: Greer Pickerel, MD;  Location: [facility];  Service: General;  Laterality: [name];  . CARDIAC CATHETERIZATION [name] 03/22/2016   Procedure: Right/Left Heart Cath and Coronary Angiography;  Surgeon: Troy Sine, MD;  Location: [facility];  Service: Cardiovascular;  Laterality: [name];  . DILATION AND CURETTAGE OF UTERUS     age 39  . ESOPHAGOGASTRODUODENOSCOPY (EGD) WITH PROPOFOL [name] 10/14/2015   Procedure: ESOPHAGOGASTRODUODENOSCOPY (EGD)  WITH PROPOFOL;  Surgeon: Clarene Essex, MD;  Location: [facility];  Service: Endoscopy;  Laterality: [name];  . LAPAROSCOPIC GASTRIC SLEEVE RESECTION WITH HIATAL HERNIA REPAIR [name] 07/05/2015   Procedure: LAPAROSCOPIC GASTRIC SLEEVE RESECTION WITH HIATAL HERNIA REPAIR, upper endoscopy;  Surgeon: Greer Pickerel, MD;  Location: [facility];  Service: General;  Laterality: [name];     Current Outpatient Prescriptions  Medication Sig Dispense Refill  . acetaminophen (TYLENOL) 500 MG tablet Take 500 mg by mouth every 6 (six) hours as needed for headache.    [name] ambrisentan (LETAIRIS) 5 MG tablet  Take 5 mg by mouth daily.    [name] aspirin EC 81 MG tablet Take 1 tablet (81 mg total) by mouth daily.    . benzonatate (TESSALON PERLES) 100 MG capsule Take 1 capsule (100 mg total) by mouth 2 (two) times daily as needed for cough. 20 capsule 0  . ergocalciferol (VITAMIN D2) 50000 units capsule Take 1 capsule (50,000 Units total) by mouth once a week. (Patient taking differently: Take 50,000 Units by mouth once a week. On Monday.) 4 capsule 3  . furosemide (LASIX) 40 MG tablet Take 1 tablet (40 mg total) by mouth daily. 30 tablet 11  . guaiFENesin-dextromethorphan (ROBITUSSIN DM) 100-10 MG/5ML syrup Take 5 mLs by mouth every 6 (six) hours as needed for cough. 118 mL 0  . hydroxychloroquine (PLAQUENIL) 200 MG tablet Take 200 mg by mouth 2 (two) times daily.    . montelukast (SINGULAIR) 10 MG tablet Take 1 tablet (10 mg total) by mouth at bedtime. (Patient taking differently: Take 10 mg by mouth at bedtime as needed (allergies). ) 30 tablet 3  . Multiple Vitamin (MULTIVITAMIN WITH MINERALS) TABS tablet Take 1 tablet by mouth daily.    . nitroGLYCERIN (NITROSTAT) 0.4 MG SL tablet Place 0.4 mg under the tongue every 5 (five) minutes as needed for chest pain (3 DOSES MAX).    [name] ondansetron (ZOFRAN ODT) 4 MG disintegrating tablet One tablet daily, as needed , for nusea (Patient taking differently: Take 4 mg by mouth every 6 (six) hours as needed for nausea or vomiting. One tablet daily, as needed , for nusea) 30 tablet 2  . pantoprazole (PROTONIX) 40 MG tablet Take 1 tablet (40 mg total) by mouth daily. 30 tablet 6  . potassium chloride SA (K-DUR,KLOR-CON) 20 MEQ tablet Take 1 tablet (20 mEq total) by mouth 2 (two) times daily. (Patient taking differently: Take 40 mEq by mouth daily. ) 60 tablet 6  . tadalafil, PAH, (ADCIRCA) 20 MG tablet Take 40 mg by mouth daily.     No current facility-administered medications for this visit.     Allergies:   Peanut-containing drug products; Potassium iodide; Ace  inhibitors; Codeine; Doxycycline; Amlodipine; Latex; and Penicillins   Social History:  The patient  reports that she has never smoked. She has never used smokeless tobacco. She reports that she does not drink alcohol or use drugs.   Family History:  The patient's family history includes Arthritis in her father, mother, and sister; Bleeding Disorder in her sister; Breast cancer in her sister; CAD in her mother; Cancer in her father; Colon cancer in her maternal grandfather; Depression in her father, mother, and sister; Diabetes in her father and mother; Hypertension in her father, mother, and sister; Stroke in her father and maternal aunt; Thyroid disease in her sister.    ROS:  Please see the history of present illness.   Otherwise, review of systems is positive for diarrhea, headaches.  All other systems are reviewed and negative.    PHYSICAL EXAM: VS:  BP 110/68   Pulse 88   Ht 5' (1.524 m)   Wt 205 lb 6.4 oz (93.2 kg)   LMP 10/31/2002   BMI 40.11 kg/m  , BMI Body mass index is 40.11 kg/m. GEN: Well nourished, well developed, in no acute distress  HEENT: normal  Neck: no JVD, carotid bruits, or masses Cardiac: RRR; no murmurs, rubs, or gallops,no edema  Respiratory:  clear to auscultation bilaterally, normal work of breathing GI: soft, nontender, nondistended, + BS MS: no deformity or atrophy  Skin: warm and dry Neuro:  Strength and sensation are intact Psych: euthymic mood, full affect  EKG:  EKG is ordered today. Personal review of the ekg ordered 08/10/16 shows sinus rhythm, RVH, prolonged QT, QTC 548  Recent Labs: 07/30/2016: B Natriuretic Peptide 355.0; Magnesium 2.2 07/31/2016: ALT 25; Hemoglobin 12.3; Platelets 283 08/01/2016: BUN 20; Creatinine, Ser 1.12; Potassium 3.4; Sodium 138    Lipid Panel     Component Value Date/Time   CHOL 111 (L) 03/10/2016 1038   TRIG 83 03/10/2016 1038   HDL 32 (L) 03/10/2016 1038   CHOLHDL 3.5 03/10/2016 1038   VLDL 17 03/10/2016  1038   LDLCALC 62 03/10/2016 1038     Wt Readings from Last 3 Encounters:  09/14/16 205 lb 6.4 oz (93.2 kg)  09/07/16 204 lb 9.6 oz (92.8 kg)  08/10/16 201 lb 6.4 oz (91.4 kg)      Other studies Reviewed: Additional studies/ records that were reviewed today include: TTE 03/22/16, Cath 03/22/16  Review of the above records today demonstrates:  - Left ventricle: The cavity size was normal. Systolic function was   normal. The estimated ejection fraction was in the range of 50%   to 55%. Wall motion was normal; there were no regional wall   motion abnormalities. Doppler parameters are consistent with   abnormal left ventricular relaxation (grade 1 diastolic   dysfunction). Doppler parameters are consistent with high   ventricular filling pressure. - Ventricular septum: The contour showed diastolic flattening and   systolic flattening consistent with right ventricular pressure   and volume overload. - Aortic valve: Transvalvular velocity was within the normal range.   There was no stenosis. There was no regurgitation. - Mitral valve: There was trivial regurgitation. - Right ventricle: The cavity size was normal. Wall thickness was   normal. Systolic function was reduced. - Tricuspid valve: There was mild regurgitation. - Pulmonary arteries: Systolic pressure was severely increased. PA   peak pressure: 91 mm Hg (S). - Pericardium, extracardiac: A trivial pericardial effusion was   identified.   The left ventricular systolic function is normal.  The left ventricular ejection fraction is 55-65% by visual estimate.  Hemodynamic findings consistent with severe pulmonary hypertension.   Severe right ventricular systolic pressure and pulmonary hypertension with systolic pressures greater than 100 mmHg.  Significant pulmonary capillary/LV gradient, without previous evidence of mitral stenosis on echocardiography.  Normal coronary arteries.  Normal LV systolic function with left  ventricular hypertrophy.  Holter 08/17/16 Minimum HR: 73 BPM at 6:18:06 AM(2) Maximum HR: 130 BPM at 5:07:20 PM(2) Average HR: 88 BPM Rare PVCs and APCs Sinus rhythm with sinus tachycardia  ASSESSMENT AND PLAN:  1. Acute CHF - Echo in 2016 with normal LVEF.  Currently appears to be well compensated. Is not having any further issues with shortness of breath at the moment.  2. Chest pain  - No evidence  of coronary artery disease.  3. HTN - Well controlled today  4. Prolonged QT - remains lengthened on multiple EKGs. Been intolerant of both medical and atenolol, with episodes of syncope. She wore a Holter monitor which showed no evidence of ventricular arrhythmias. In reviewing her EKGs, she has had QT intervals in the 470 range in 2016, but at the end of 2017 they jumped out to 540 msec. Is possible that starting medications cause this. She does say that she started the hydroxychloroquine in that time period, And that medicine can cause QT prolongation. Asked her discussed this with her dermatologist and see if there is a different medication that she can try. She also takes as needed Protonix and Zofran, and I have asked her to abstain from these medications as they can also cause Q prolongation.  5. Pulmonary hypertension: Follow-up planned with pulmonary.  Current medicines are reviewed at length with the patient today.   The patient does not have concerns regarding her medicines.  The following changes were made today:  nadolol  Labs/ tests ordered today include:  No orders of the defined types were placed in this encounter.    Disposition:   FU with [place] 6 months  Signed, Amee Boothe Meredith Leeds, MD  09/14/2016 10:02 AM     [facility] [address] [place] [address] [phone] (office) [phone] (fax)

## 2016-09-14 NOTE — Progress Notes (Signed)
*  PRELIMINARY RESULTS* Echocardiogram 2D Echocardiogram has been performed.  Sabrina [name] 09/14/2016, 2:40 PM

## 2016-09-14 NOTE — Patient Instructions (Signed)
Medication Instructions:    Your physician recommends that you continue on your current medications as directed. Please refer to the Current Medication list given to you today.  --- If you need a refill on your cardiac medications before your next appointment, please call your pharmacy. ---  Labwork:  None ordered  Testing/Procedures:  None ordered  Follow-Up:  To be determined   Any Other Special Instructions Will Be Listed Below (If Applicable).  Discuss stopping your Plaquenil (hydroxycholoroquine) with your dermatologist.  Once you have stopped this medication we will arrange a follow up EKG to see if this was the cause of the prolonged QT seen on your EKG.   Thank you for choosing [facility]!!   Trinidad Curet, RN [phone]

## 2016-09-19 ENCOUNTER — Ambulatory Visit (INDEPENDENT_AMBULATORY_CARE_PROVIDER_SITE_OTHER): Payer: 59 | Admitting: Family Medicine

## 2016-09-19 ENCOUNTER — Telehealth: Payer: Self-pay | Admitting: Family Medicine

## 2016-09-19 ENCOUNTER — Encounter: Payer: Self-pay | Admitting: Family Medicine

## 2016-09-19 VITALS — BP 144/70 | HR 84 | Temp 97.3°F | Resp 16 | Ht 60.0 in | Wt 205.1 lb

## 2016-09-19 DIAGNOSIS — I10 Essential (primary) hypertension: Secondary | ICD-10-CM

## 2016-09-19 DIAGNOSIS — R110 Nausea: Secondary | ICD-10-CM

## 2016-09-19 DIAGNOSIS — B9789 Other viral agents as the cause of diseases classified elsewhere: Secondary | ICD-10-CM

## 2016-09-19 DIAGNOSIS — R05 Cough: Secondary | ICD-10-CM | POA: Diagnosis not present

## 2016-09-19 DIAGNOSIS — E669 Obesity, unspecified: Secondary | ICD-10-CM

## 2016-09-19 DIAGNOSIS — J069 Acute upper respiratory infection, unspecified: Secondary | ICD-10-CM

## 2016-09-19 DIAGNOSIS — R058 Other specified cough: Secondary | ICD-10-CM

## 2016-09-19 DIAGNOSIS — E1169 Type 2 diabetes mellitus with other specified complication: Secondary | ICD-10-CM

## 2016-09-19 MED ORDER — BENZONATATE 100 MG PO CAPS: 100.0000 mg | ORAL_CAPSULE | Freq: Two times a day (BID) | ORAL | 0 refills | Status: DC | PRN

## 2016-09-19 MED ORDER — PREDNISONE 5 MG (21) PO TBPK: 5.0000 mg | ORAL_TABLET | ORAL | 0 refills | Status: DC

## 2016-09-19 MED ORDER — AZITHROMYCIN 250 MG PO TABS: ORAL_TABLET | ORAL | 0 refills | Status: DC

## 2016-09-19 NOTE — Patient Instructions (Signed)
F/U end March, cancel Feb appointment  Keep appt at [facility] on 10/19/2016 at [place] with Dr Bryson Dames about your skin  Do not leave home before next Monday, work note from mon return next Monday  Medications sent in as discussed

## 2016-09-19 NOTE — Telephone Encounter (Signed)
Called and left message for patient to return call.  

## 2016-09-19 NOTE — Telephone Encounter (Signed)
Patient states that symptoms started Friday 2/2.  Please advise.

## 2016-09-19 NOTE — Telephone Encounter (Signed)
Patient aware and will come in at 1:15 

## 2016-09-19 NOTE — Telephone Encounter (Signed)
I will see her this afternoon, pls work her in before 3

## 2016-09-19 NOTE — Telephone Encounter (Signed)
Sabrina [name] is c/o cough/fever/chills/body ache asking to be seen , please advise?

## 2016-09-19 NOTE — Progress Notes (Signed)
Sabrina [name]     MRN: [id number]      DOB: 31-Jul-1961   HPI Sabrina [name]  Last week Friday acute genralized pain esp in legs  And headache. On Sunday temp to 102, and cough , sore throat , poor appetite, also v/d  Today c/o fatigue, cough with chest pain, no sputum, lower ext pain and weakness, no nasal drainage, no fever or chills x 3 days, vomit x 2 yesterday, loose stool up to yesterday ROS See HPI . Denies sinus pressure, nasal congestion, ear pain or sore throat. Denies chest congestion, productive cough or wheezing. Denies  palpitations and leg swelling  Denies dysuria, frequency, hesitancy or incontinence. . Denies headaches, seizures, numbness, or tingling. Denies depression, anxiety or insomnia. Denies skin break down or rash.   PE  BP (!) 144/70 (BP Location: Right Arm, Patient Position: Sitting, Cuff Size: Normal)   Pulse 84   Temp 97.3 F (36.3 C) (Temporal)   Resp 16   Ht 5' (1.524 m)   Wt 205 lb 1.3 oz (93 kg)   LMP 10/31/2002   BMI 40.05 kg/m  Ill appearing Patient alert and oriented and in no cardiopulmonary distress.  HEENT: No facial asymmetry, EOMI,   oropharynx pink and moist.  Neck supple no JVD, no mass.  Chest: decreased air entry, few scattered crackles no wheezes  CVS: S1, S2 no murmurs, no S3.Regular rate.  ABD: Soft generalized superficial tenderness, no guarding or rebound, hyperactive BS  Ext: No edema  MS: Adequate ROM spine, shoulders, hips and knees.  Skin: Intact, no ulcerations or rash noted.  Psych: Good eye contact, normal affect. Memory intact not anxious or depressed appearing.  CNS: CN 2-12 intact, power,  normal throughout.no focal deficits noted.   Assessment & Plan  Allergic cough Uncontrolled associated with fever and chills in pt with pulmonary disease, prednisone dose pack and Z pack prescribed, Work excuse x 1 week  Nausea without vomiting Acute episode of of N/V zofran prescribed for symptomatic  relief, not clinically dehydrated  Essential hypertension Elevated pressure at visit, has been using a lot of OTC decongestants for symptoms, no med changes DASH diet and commitment to daily physical activity for a minimum of 30 minutes discussed and encouraged, as a part of hypertension management. The importance of attaining a healthy weight is also discussed.  BP/Weight 09/19/2016 09/14/2016 09/07/2016 08/10/2016 08/01/2016 07/30/2016 [id number]  Systolic BP 123456 [id number] [id number] [id number] - [id number]  Diastolic BP 70 68 58 78 97 - 72  Wt. (Lbs) 205.08 205.4 204.6 201.4 - 203 203  BMI 40.05 40.11 39.96 39.33 - 39.65 39.65       Diabetes mellitus type 2 in obese [facility]) Sabrina [name] is reminded of the importance of commitment to daily physical activity for 30 minutes or more, as able and the need to limit carbohydrate intake to 30 to 60 grams per meal to help with blood sugar control.   Sabrina [name] is reminded of the importance of daily foot exam, annual eye examination, and good blood sugar, blood pressure and cholesterol control.  Diabetic Labs Latest Ref Rng & Units 08/01/2016 07/31/2016 07/30/2016 07/09/2016 07/08/2016  HbA1c <5.7 % - - - - -  Microalbumin Not estab mg/dL - - - - -  Micro/Creat Ratio <30 mcg/mg creat - - - - -  Chol 125 - 200 mg/dL - - - - -  HDL >=46 mg/dL - - - - -  Calc LDL <130 mg/dL - - - - -  Triglycerides <150 mg/dL - - - - -  Creatinine 0.44 - 1.00 mg/dL 1.12(H) 1.04(H) 1.08(H) 0.93 1.12(H)   BP/Weight 09/19/2016 09/14/2016 09/07/2016 08/10/2016 08/01/2016 07/30/2016 [id number]  Systolic BP 123456 A999333 A999333 [id number] - [id number]  Diastolic BP 70 68 58 78 97 - 72  Wt. (Lbs) 205.08 205.4 204.6 201.4 - 203 203  BMI 40.05 40.11 39.96 39.33 - 39.65 39.65   Foot/eye exam completion dates Latest Ref Rng & Units 10/18/2015 09/15/2014  Eye Exam No Retinopathy - No Retinopathy  Foot exam Order - - -  Foot Form Completion - Done -        Viral URI with cough Fluids, rest and supportive care,  work excuse x 1 week

## 2016-09-23 ENCOUNTER — Encounter: Payer: Self-pay | Admitting: Family Medicine

## 2016-09-23 DIAGNOSIS — J069 Acute upper respiratory infection, unspecified: Secondary | ICD-10-CM | POA: Insufficient documentation

## 2016-09-23 DIAGNOSIS — B9789 Other viral agents as the cause of diseases classified elsewhere: Principal | ICD-10-CM

## 2016-09-23 NOTE — Assessment & Plan Note (Signed)
Fluids, rest and supportive care, work excuse x 1 week

## 2016-09-23 NOTE — Assessment & Plan Note (Signed)
Acute episode of of N/V zofran prescribed for symptomatic relief, not clinically dehydrated

## 2016-09-23 NOTE — Assessment & Plan Note (Signed)
Uncontrolled associated with fever and chills in pt with pulmonary disease, prednisone dose pack and Z pack prescribed, Work excuse x 1 week

## 2016-09-23 NOTE — Assessment & Plan Note (Signed)
Sabrina [name] is reminded of the importance of commitment to daily physical activity for 30 minutes or more, as able and the need to limit carbohydrate intake to 30 to 60 grams per meal to help with blood sugar control.   Sabrina [name] is reminded of the importance of daily foot exam, annual eye examination, and good blood sugar, blood pressure and cholesterol control.  Diabetic Labs Latest Ref Rng & Units 08/01/2016 07/31/2016 07/30/2016 07/09/2016 07/08/2016  HbA1c <5.7 % - - - - -  Microalbumin Not estab mg/dL - - - - -  Micro/Creat Ratio <30 mcg/mg creat - - - - -  Chol 125 - 200 mg/dL - - - - -  HDL >=46 mg/dL - - - - -  Calc LDL <130 mg/dL - - - - -  Triglycerides <150 mg/dL - - - - -  Creatinine 0.44 - 1.00 mg/dL 1.12(H) 1.04(H) 1.08(H) 0.93 1.12(H)   BP/Weight 09/19/2016 09/14/2016 09/07/2016 08/10/2016 08/01/2016 07/30/2016 [id number]  Systolic BP 123456 A999333 A999333 [id number] - [id number]  Diastolic BP 70 68 58 78 97 - 72  Wt. (Lbs) 205.08 205.4 204.6 201.4 - 203 203  BMI 40.05 40.11 39.96 39.33 - 39.65 39.65   Foot/eye exam completion dates Latest Ref Rng & Units 10/18/2015 09/15/2014  Eye Exam No Retinopathy - No Retinopathy  Foot exam Order - - -  Foot Form Completion - Done -

## 2016-09-23 NOTE — Assessment & Plan Note (Signed)
Elevated pressure at visit, has been using a lot of OTC decongestants for symptoms, no med changes DASH diet and commitment to daily physical activity for a minimum of 30 minutes discussed and encouraged, as a part of hypertension management. The importance of attaining a healthy weight is also discussed.  BP/Weight 09/19/2016 09/14/2016 09/07/2016 08/10/2016 08/01/2016 07/30/2016 [id number]  Systolic BP 123456 A999333 A999333 [id number] - [id number]  Diastolic BP 70 68 58 78 97 - 72  Wt. (Lbs) 205.08 205.4 204.6 201.4 - 203 203  BMI 40.05 40.11 39.96 39.33 - 39.65 39.65

## 2016-10-03 ENCOUNTER — Encounter: Payer: Self-pay | Admitting: Cardiology

## 2016-10-09 ENCOUNTER — Ambulatory Visit: Payer: 59 | Admitting: Family Medicine

## 2016-10-12 ENCOUNTER — Ambulatory Visit: Payer: 59 | Admitting: Cardiology

## 2016-10-13 ENCOUNTER — Other Ambulatory Visit: Payer: Self-pay | Admitting: Family Medicine

## 2016-10-17 ENCOUNTER — Telehealth: Payer: Self-pay | Admitting: *Deleted

## 2016-10-17 NOTE — Telephone Encounter (Signed)
Lmtcb to follow up with patient (see if/when she stopped Plaquenil and schedule and EKG f/u to look at QTC)

## 2016-10-18 DIAGNOSIS — L259 Unspecified contact dermatitis, unspecified cause: Secondary | ICD-10-CM | POA: Diagnosis not present

## 2016-10-22 ENCOUNTER — Telehealth: Payer: Self-pay | Admitting: Emergency Medicine

## 2016-10-22 NOTE — Telephone Encounter (Signed)
Spoke with pt. She is needing to reschedule her 6MW and ROV. [facility] does not have any availability until May. 6MW and ROV have been scheduled for 11/16/2016 with TP. Nothing further was needed.

## 2016-10-23 ENCOUNTER — Ambulatory Visit: Payer: 59

## 2016-10-23 ENCOUNTER — Ambulatory Visit: Payer: 59 | Admitting: Emergency Medicine

## 2016-11-01 NOTE — Telephone Encounter (Signed)
lmtcb

## 2016-11-07 ENCOUNTER — Ambulatory Visit: Payer: 59 | Admitting: Family Medicine

## 2016-11-16 ENCOUNTER — Ambulatory Visit (INDEPENDENT_AMBULATORY_CARE_PROVIDER_SITE_OTHER): Payer: 59 | Admitting: *Deleted

## 2016-11-16 ENCOUNTER — Ambulatory Visit (INDEPENDENT_AMBULATORY_CARE_PROVIDER_SITE_OTHER): Payer: 59 | Admitting: Cardiology

## 2016-11-16 ENCOUNTER — Other Ambulatory Visit (INDEPENDENT_AMBULATORY_CARE_PROVIDER_SITE_OTHER): Payer: 59

## 2016-11-16 ENCOUNTER — Ambulatory Visit (INDEPENDENT_AMBULATORY_CARE_PROVIDER_SITE_OTHER): Payer: 59 | Admitting: Adult Health

## 2016-11-16 ENCOUNTER — Encounter: Payer: Self-pay | Admitting: Adult Health

## 2016-11-16 ENCOUNTER — Encounter: Payer: Self-pay | Admitting: Cardiology

## 2016-11-16 VITALS — BP 114/80 | HR 76 | Ht 60.0 in | Wt 200.0 lb

## 2016-11-16 VITALS — BP 114/74 | HR 74 | Ht 60.0 in | Wt 199.2 lb

## 2016-11-16 DIAGNOSIS — I4581 Long QT syndrome: Secondary | ICD-10-CM | POA: Diagnosis not present

## 2016-11-16 DIAGNOSIS — I272 Pulmonary hypertension, unspecified: Secondary | ICD-10-CM | POA: Diagnosis not present

## 2016-11-16 DIAGNOSIS — R0602 Shortness of breath: Secondary | ICD-10-CM

## 2016-11-16 DIAGNOSIS — I5032 Chronic diastolic (congestive) heart failure: Secondary | ICD-10-CM | POA: Diagnosis not present

## 2016-11-16 DIAGNOSIS — M359 Systemic involvement of connective tissue, unspecified: Secondary | ICD-10-CM | POA: Diagnosis not present

## 2016-11-16 LAB — HEPATIC FUNCTION PANEL
ALT: 7 U/L (ref 0–35)
AST: 14 U/L (ref 0–37)
Albumin: 3.8 g/dL (ref 3.5–5.2)
Alkaline Phosphatase: 48 U/L (ref 39–117)
BILIRUBIN TOTAL: 0.5 mg/dL (ref 0.2–1.2)
Bilirubin, Direct: 0.1 mg/dL (ref 0.0–0.3)
Total Protein: 7.4 g/dL (ref 6.0–8.3)

## 2016-11-16 NOTE — Progress Notes (Signed)
@Patient  ID: Sabrina [name], female    DOB: 1960/08/25, 56 y.o.   MRN: [id number]  Chief Complaint  Patient presents with  . Follow-up    Pulmonary HTN     Referring provider: Fayrene Helper, MD  HPI: 56 yo female never smoker followed for severe pulmonary HTN (?OSA, prev FEN/FEN use ,  connective tissue dz)  And Diastolic CHF  PMH HTN, DM, Obesity s/p gastric surgery -gastric sleeve 2016  Connective tissue dz on Plaquenil  hx followed at [facility] on Plaquenil.   TEST  Echo 03/22/16 EF 50-55%, normal wall motion, grade 1 diastolic dysfunction, diastolic and systolic flattening of ventricular septum consistent with RV pressure and volume overload, trivial MR, reduced RVSF, mild TR, PASP 91 mmHg, trivial pericardial effusion; severe pulmonary hypertension  L/RHC 03/22/16  The left ventricular systolic function is normal.  The left ventricular ejection fraction is 55-65% by visual estimate.  Hemodynamic findings consistent with severe pulmonary hypertension.  PASP 114 mmHg Severe right ventricular systolic pressure and pulmonary hypertension with systolic pressures greater than 100 mmHg. Significant pulmonary capillary/LV gradient, without previous evidence of mitral stenosis on echocardiography. Normal coronary arteries. Normal LV systolic function with left ventricular hypertrophy. RECOMMENDATION: The patient will undergo 2-D echo Doppler study later today prior to being discharged. The etiology of her severe pulmonary hypertension is unknown and consider possible primary pulmonary hypertension. Echo Doppler study will be helpful to further assess her mitral valve and potential for RV volume/pressure overload  Echo 09/2016 much improved with EF 65%, PAP 36.   HST 8/hr  6 min walk 07/27/16 291 meters completed, O2 sats 92% to 98% , 2 min post walk 97%.    11/16/2016 Follow up : Pulmonary HTN /6 min walk  Pt returns for 3 month follow up . She remains on adcirca  20mg  and letaris 5mg  .  She is feeling good, breathing continues to do better.  Repeat echo in Feb much improved PAP down to 32mmHg (down from 58mmHg in 03/2016).  She  Did have to stop adcirca due headaches and diarrhea . All this has resolved.  No flare in dyspnea.  6 min walk test today shows is improved with 329 meters completed. O2 sats 97-99% , 2 min post walk 98%.  Has an upcoming ov with Rheumatology next week at [facility] . Plaquenil is on hold for now.     Allergies  Allergen Reactions  . Peanut-Containing Drug Products Nausea And Vomiting  . Potassium Iodide Other (See Comments)    facial swelling, also eyes, and ears  . Ace Inhibitors Cough  . Codeine Nausea And Vomiting  . Doxycycline Hives  . Peanut Oil Swelling  . Amlodipine Rash  . Latex Hives and Rash  . Penicillins Hives, Itching and Rash    .Marland KitchenHas patient had a PCN reaction causing immediate rash, facial/tongue/throat swelling, SOB or lightheadedness with hypotension: No Has patient had a PCN reaction causing severe rash involving mucus membranes or skin necrosis: No Has patient had a PCN reaction that required hospitalization No Has patient had a PCN reaction occurring within the last 10 years: YES (3 years ago)  If all of the above answers are "NO", then may proceed with Cephalosporin use.     Immunization History  Administered Date(s) Administered  . Influenza Split 06/22/2011  . Influenza Whole 07/11/2006  . Influenza,inj,Quad PF,36+ Mos 06/26/2013, 05/11/2014, 06/13/2015  . Influenza-Unspecified 06/06/2016  . Pneumococcal Conjugate-13 02/07/2015  . Pneumococcal Polysaccharide-23 06/26/2013  . Td 04/11/2004  .  Tdap 05/09/2015    Past Medical History:  Diagnosis Date  . Allergy   . Anemia   . Arthritis   . Asthma   . Chronic bronchitis ([place])   . Diabetes mellitus   . Endometriosis   . Fibroid uterus   . GERD (gastroesophageal reflux disease)   . History of Doppler ultrasound    a. Carotid US  8/08:Estimated stenosis in the right and left internal carotid arteries is 0-50% and 0-50%.  . History of echocardiogram    a. Echo 6/16: EF 60-65%, no RWMA.  . History of hiatal hernia   . History of nuclear stress test    a. Myoview 6/16: There is no resting or stress perfusion defect consistent with no prior infarct and no ischemia. EF 73 %  The patient was hypertensive prior and during the study. Cardiac cath 04/2016 showed normal coronary arteries  . Hx of iron deficiency anemia   . Hypersomnia with sleep apnea   . Hypertension   . 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 Menorrhagia   . Obesity   . Pneumonia   . 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 Secondary hyperthyroidism   . Thyroid disease   . Tinnitus    left ear  . Vertigo   . Vitamin D deficiency     Tobacco History: History  Smoking Status  . Never Smoker  Smokeless Tobacco  . Never Used   Counseling given: Not Answered   Outpatient Encounter Prescriptions as of 11/16/2016  Medication Sig  . acetaminophen (TYLENOL) 500 MG tablet Take 500 mg by mouth every 6 (six) hours as needed for headache.  [name] ambrisentan (LETAIRIS) 5 MG tablet Take 5 mg by mouth daily.  [name] aspirin EC 81 MG tablet Take 1 tablet (81 mg total) by mouth daily.  . benzonatate (TESSALON) 100 MG capsule Take 1 capsule (100 mg total) by mouth 2 (two) times daily as needed for cough.  . ergocalciferol (VITAMIN D2) 50000 units capsule Take 1 capsule (50,000 Units total) by mouth once a week.  . furosemide (LASIX) 40 MG tablet Take 1 tablet (40 mg total) by mouth daily.  [name] guaiFENesin-dextromethorphan (ROBITUSSIN DM) 100-10 MG/5ML syrup Take 5 mLs by mouth every 6 (six) hours as needed for cough.  . hydroxychloroquine (PLAQUENIL) 200 MG tablet Take 200 mg by mouth 2 (two) times daily.  . montelukast (SINGULAIR) 10 MG tablet Take 1 tablet (10 mg total) by mouth at bedtime.  . Multiple Vitamin (MULTIVITAMIN WITH MINERALS) TABS tablet Take 1 tablet by mouth daily.  .  nitroGLYCERIN (NITROSTAT) 0.4 MG SL tablet Place 0.4 mg under the tongue every 5 (five) minutes as needed for chest pain (3 DOSES MAX).  [name] ondansetron (ZOFRAN-ODT) 4 MG disintegrating tablet Take 4 mg by mouth every 6 (six) hours as needed for nausea or vomiting.  . pantoprazole (PROTONIX) 40 MG tablet Take 1 tablet (40 mg total) by mouth daily.  . potassium chloride SA (K-DUR,KLOR-CON) 20 MEQ tablet Take 1 tablet (20 mEq total) by mouth 2 (two) times daily.  [name] triamcinolone ointment (KENALOG) 0.1 % APPLY AS DIRECTED TO ITCHING SPOTS ON THE BODY DAILY TO TWICE DAILY AS NEEDED. NOT TO FACE.  . [DISCONTINUED] predniSONE (STERAPRED UNI-PAK 21 TAB) 5 MG (21) TBPK tablet Take 1 tablet (5 mg total) by mouth as directed. Use as directed  . tadalafil, PAH, (ADCIRCA) 20 MG tablet Take 40 mg by mouth daily.  . [DISCONTINUED] azithromycin (ZITHROMAX) 250 MG tablet Two tablets  on day one, then one tablet ionce daily for an additional 4 days   No facility-administered encounter medications on file as of 11/16/2016.      Review of Systems  Constitutional:   No  weight loss, night sweats,  Fevers, chills, fatigue, or  lassitude.  HEENT:   No headaches,  Difficulty swallowing,  Tooth/dental problems, or  Sore throat,                No sneezing, itching, ear ache, nasal congestion, post nasal drip,   CV:  No chest pain,  Orthopnea, PND, swelling in lower extremities, anasarca, dizziness, palpitations, syncope.   GI  No heartburn, indigestion, abdominal pain, nausea, vomiting, diarrhea, change in bowel habits, loss of appetite, bloody stools.   Resp: .  No excess mucus, no productive cough,  No non-productive cough,  No coughing up of blood.  No change in color of mucus.  No wheezing.  No chest wall deformity  Skin: no rash or lesions.  GU: no dysuria, change in color of urine, no urgency or frequency.  No flank pain, no hematuria   MS:  No joint pain or swelling.  No decreased range of motion.  No back  pain.    Physical Exam  BP 114/74 (BP Location: Left Arm, Cuff Size: Normal)   Pulse 74   Ht 5' (1.524 m)   Wt 199 lb 3.2 oz (90.4 kg)   LMP 10/31/2002   SpO2 98%   BMI 38.90 kg/m   GEN: A/Ox3; pleasant , NAD, well nourished    HEENT:  [place]/AT,  EACs-clear, TMs-wnl, NOSE-clear, THROAT-clear, no lesions, no postnasal drip or exudate noted.   NECK:  Supple w/ fair ROM; no JVD; normal carotid impulses w/o bruits; no thyromegaly or nodules palpated; no lymphadenopathy.    RESP  Clear  P & A; w/o, wheezes/ rales/ or rhonchi. no accessory muscle use, no dullness to percussion  CARD:  RRR, no m/r/g, tr  peripheral edema, pulses intact, no cyanosis or clubbing.  GI:   Soft & nt; nml bowel sounds; no organomegaly or masses detected.   Musco: Warm bil, no deformities or joint swelling noted.   Neuro: alert, no focal deficits noted.    Skin: Warm, leathery skin along upper torso , forearm.     Lab Results:  CBC  No results found for: PROBNP  Imaging: No results found.   Assessment & Plan:   Connective tissue disease, undifferentiated ([place]) Keep follow up with Rheumatology   Chronic diastolic heart failure ([place]) Appears compensated without evidence of fluid overload.   Pulmonary hypertension Severe Pulmonary HTN - she has had a dramatic response to therapy  Echo showed significant drop in PAP 91>36 on most recent echo 09/2016 .  She is clinically improving and 6 min walk has improved and no desats.  Check LFt   Plan  Patient Instructions  Continue on Letaris 5mg  daily .  Labs today .  follow up with [facility] as planned.  Follow up Dr. Lamonte Sakai  In 3 months and As needed           Rexene Edison, NP 11/16/2016

## 2016-11-16 NOTE — Patient Instructions (Signed)
Continue on Letaris 5mg  daily .  Labs today .  follow up with [facility] as planned.  Follow up Dr. Lamonte Sakai  In 3 months and As needed

## 2016-11-16 NOTE — Telephone Encounter (Signed)
Pt seen in office this week.  Closing encounter

## 2016-11-16 NOTE — Assessment & Plan Note (Signed)
Keep follow up with Rheumatology  

## 2016-11-16 NOTE — Progress Notes (Signed)
Electrophysiology Office Note   Date:  11/16/2016   ID:  Sabrina [name], Sabrina [name] [birth year], MRN [id number]  PCP:  [name], MD  Cardiologist:  Radford Pax Primary Electrophysiologist:  Alvilda Mckenna Meredith Leeds, MD    Chief Complaint  Patient presents with  . Loss of Consciousness     History of Present Illness: Sabrina [name] is a 56 y.o. female who presents today for electrophysiology evaluation.   Hx of morbid obesity, HTN, HL, GERD, DM2. She had a recent hospitalization for acute hypoxic respiratoyr failure, pulmonary hypertension, diastolic HF. 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 She started the medications last weekend, but had significant GI symptoms as well as headaches. She also has long QT syndrome with a QTc of 523.  She presented to the hospital with episodes of syncope on 12/19.  She went to the emergency room, when she stood up to leave, after feeling better, she had a second syncopal event. She said that within one hour of starting the atenolol, she felt dizzy and lightheaded  Today, she denies symptoms of palpitations, chest pain, shortness of breath, orthopnea, PND, lower extremity edema, claudication, dizziness, presyncope, syncope, bleeding, or neurologic sequela. The patient is tolerating medications without difficulties and is otherwise without complaint today. She stopped many of her medications, and has not had issues off of them. She is following up with rheumatology at [facility] for further options.   Past Medical History:  Diagnosis Date  . Allergy   . Anemia   . Arthritis   . Asthma   . Chronic bronchitis ([place])   . Diabetes mellitus   . Endometriosis   . Fibroid uterus   . GERD (gastroesophageal reflux disease)   . History of Doppler ultrasound    a. Carotid US 8/08:Estimated stenosis in the right and left internal carotid arteries is 0-50% and 0-50%.  . History of  echocardiogram    a. Echo 6/16: EF 60-65%, no RWMA.  . History of hiatal hernia   . History of nuclear stress test    a. Myoview 6/16: There is no resting or stress perfusion defect consistent with no prior infarct and no ischemia. EF 73 %  The patient was hypertensive prior and during the study. Cardiac cath 04/2016 showed normal coronary arteries  . Hx of iron deficiency anemia   . Hypersomnia with sleep apnea   . Hypertension   . 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 Menorrhagia   . Obesity   . Pneumonia   . 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 Secondary hyperthyroidism   . Thyroid disease   . Tinnitus    left ear  . Vertigo   . Vitamin D deficiency    Past Surgical History:  Procedure Laterality Date  . ABDOMINAL HYSTERECTOMY  2004   supracervical hysterectomy  . BREAST SURGERY  2005   reduction  . BREATH TEK H PYLORI [name] 12/24/2014   Procedure: BREATH TEK H PYLORI;  Surgeon: Greer Pickerel, MD;  Location: [facility];  Service: General;  Laterality: [name];  . CARDIAC CATHETERIZATION [name] 03/22/2016   Procedure: Right/Left Heart Cath and Coronary Angiography;  Surgeon: Troy Sine, MD;  Location: [facility];  Service: Cardiovascular;  Laterality: [name];  . DILATION AND CURETTAGE OF UTERUS     age 87  . ESOPHAGOGASTRODUODENOSCOPY (EGD) WITH PROPOFOL [name] 10/14/2015   Procedure: ESOPHAGOGASTRODUODENOSCOPY (EGD) WITH PROPOFOL;  Surgeon: Clarene Essex,  MD;  Location: [facility];  Service: Endoscopy;  Laterality: [name];  . LAPAROSCOPIC GASTRIC SLEEVE RESECTION WITH HIATAL HERNIA REPAIR [name] 07/05/2015   Procedure: LAPAROSCOPIC GASTRIC SLEEVE RESECTION WITH HIATAL HERNIA REPAIR, upper endoscopy;  Surgeon: Greer Pickerel, MD;  Location: [facility];  Service: General;  Laterality: [name];     Current Outpatient Prescriptions  Medication Sig Dispense Refill  . acetaminophen (TYLENOL) 500 MG tablet Take 500 mg by mouth every 6 (six) hours as needed for headache.    [name] ambrisentan (LETAIRIS) 5 MG tablet Take  5 mg by mouth daily.    [name] aspirin EC 81 MG tablet Take 1 tablet (81 mg total) by mouth daily.    [name] azithromycin (ZITHROMAX) 250 MG tablet Two tablets on day one, then one tablet ionce daily for an additional 4 days 6 tablet 0  . benzonatate (TESSALON) 100 MG capsule Take 1 capsule (100 mg total) by mouth 2 (two) times daily as needed for cough. 20 capsule 0  . ergocalciferol (VITAMIN D2) 50000 units capsule Take 1 capsule (50,000 Units total) by mouth once a week. 4 capsule 3  . furosemide (LASIX) 40 MG tablet Take 1 tablet (40 mg total) by mouth daily. 30 tablet 11  . guaiFENesin-dextromethorphan (ROBITUSSIN DM) 100-10 MG/5ML syrup Take 5 mLs by mouth every 6 (six) hours as needed for cough. 118 mL 0  . hydroxychloroquine (PLAQUENIL) 200 MG tablet Take 200 mg by mouth 2 (two) times daily.    . montelukast (SINGULAIR) 10 MG tablet Take 1 tablet (10 mg total) by mouth at bedtime. 30 tablet 3  . Multiple Vitamin (MULTIVITAMIN WITH MINERALS) TABS tablet Take 1 tablet by mouth daily.    . nitroGLYCERIN (NITROSTAT) 0.4 MG SL tablet Place 0.4 mg under the tongue every 5 (five) minutes as needed for chest pain (3 DOSES MAX).    [name] ondansetron (ZOFRAN-ODT) 4 MG disintegrating tablet Take 4 mg by mouth every 6 (six) hours as needed for nausea or vomiting.    . pantoprazole (PROTONIX) 40 MG tablet Take 1 tablet (40 mg total) by mouth daily. 30 tablet 6  . potassium chloride SA (K-DUR,KLOR-CON) 20 MEQ tablet Take 1 tablet (20 mEq total) by mouth 2 (two) times daily. 60 tablet 6  . predniSONE (STERAPRED UNI-PAK 21 TAB) 5 MG (21) TBPK tablet Take 1 tablet (5 mg total) by mouth as directed. Use as directed 21 tablet 0  . tadalafil, PAH, (ADCIRCA) 20 MG tablet Take 40 mg by mouth daily.    [name] triamcinolone ointment (KENALOG) 0.1 % APPLY AS DIRECTED TO ITCHING SPOTS ON THE BODY DAILY TO TWICE DAILY AS NEEDED. NOT TO FACE.     No current facility-administered medications for this visit.     Allergies:    Peanut-containing drug products; Potassium iodide; Ace inhibitors; Codeine; Doxycycline; Peanut oil; Amlodipine; Latex; and Penicillins   Social History:  The patient  reports that she has never smoked. She has never used smokeless tobacco. She reports that she does not drink alcohol or use drugs.   Family History:  The patient's family history includes Arthritis in her father, mother, and sister; Bleeding Disorder in her sister; Breast cancer in her sister; CAD in her mother; Cancer in her father; Colon cancer in her maternal grandfather; Depression in her father, mother, and sister; Diabetes in her father and mother; Hypertension in her father, mother, and sister; Stroke in her father and maternal aunt; Thyroid disease in her sister.    ROS:  Please see  the history of present illness.   Otherwise, review of systems is positive for rash.   All other systems are reviewed and negative.    PHYSICAL EXAM: VS:  BP 114/80   Pulse 76   Ht 5' (1.524 m)   Wt 200 lb (90.7 kg)   LMP 10/31/2002   SpO2 (!) 5%   BMI 39.06 kg/m  , BMI Body mass index is 39.06 kg/m. GEN: Well nourished, well developed, in no acute distress  HEENT: normal  Neck: no JVD, carotid bruits, or masses Cardiac: RRR; no murmurs, rubs, or gallops,no edema  Respiratory:  clear to auscultation bilaterally, normal work of breathing GI: soft, nontender, nondistended, + BS MS: no deformity or atrophy  Skin: warm and dry Neuro:  Strength and sensation are intact Psych: euthymic mood, full affect  EKG:  EKG is ordered today. Personal review of the ekg ordered 08/10/16 shows sinus rhythm, RVH, QTc 479  Recent Labs: 07/30/2016: B Natriuretic Peptide 355.0; Magnesium 2.2 07/31/2016: ALT 25; Hemoglobin 12.3; Platelets 283 08/01/2016: BUN 20; Creatinine, Ser 1.12; Potassium 3.4; Sodium 138    Lipid Panel     Component Value Date/Time   CHOL 111 (L) 03/10/2016 1038   TRIG 83 03/10/2016 1038   HDL 32 (L) 03/10/2016 1038    CHOLHDL 3.5 03/10/2016 1038   VLDL 17 03/10/2016 1038   LDLCALC 62 03/10/2016 1038     Wt Readings from Last 3 Encounters:  11/16/16 200 lb (90.7 kg)  09/19/16 205 lb 1.3 oz (93 kg)  09/14/16 205 lb 6.4 oz (93.2 kg)      Other studies Reviewed: Additional studies/ records that were reviewed today include: TTE 03/22/16, Cath 03/22/16  Review of the above records today demonstrates:  - Left ventricle: The cavity size was normal. Systolic function was   normal. The estimated ejection fraction was in the range of 50%   to 55%. Wall motion was normal; there were no regional wall   motion abnormalities. Doppler parameters are consistent with   abnormal left ventricular relaxation (grade 1 diastolic   dysfunction). Doppler parameters are consistent with high   ventricular filling pressure. - Ventricular septum: The contour showed diastolic flattening and   systolic flattening consistent with right ventricular pressure   and volume overload. - Aortic valve: Transvalvular velocity was within the normal range.   There was no stenosis. There was no regurgitation. - Mitral valve: There was trivial regurgitation. - Right ventricle: The cavity size was normal. Wall thickness was   normal. Systolic function was reduced. - Tricuspid valve: There was mild regurgitation. - Pulmonary arteries: Systolic pressure was severely increased. PA   peak pressure: 91 mm Hg (S). - Pericardium, extracardiac: A trivial pericardial effusion was   identified.   The left ventricular systolic function is normal.  The left ventricular ejection fraction is 55-65% by visual estimate.  Hemodynamic findings consistent with severe pulmonary hypertension.   Severe right ventricular systolic pressure and pulmonary hypertension with systolic pressures greater than 100 mmHg.  Significant pulmonary capillary/LV gradient, without previous evidence of mitral stenosis on echocardiography.  Normal coronary  arteries.  Normal LV systolic function with left ventricular hypertrophy.  Holter 08/17/16 Minimum HR: 73 BPM at 6:18:06 AM(2) Maximum HR: 130 BPM at 5:07:20 PM(2) Average HR: 88 BPM Rare PVCs and APCs Sinus rhythm with sinus tachycardia  ASSESSMENT AND PLAN:  1. Acute CHF - Echo in 2016 with normal LVEF.  Currently appears to be well compensated. Is not having any further  issues with shortness of breath at the moment.  2. Chest pain  - No evidence of coronary artery disease.   3. HTN - Well controlled today   4. Prolonged QT - medications have been stopped with improvement in her QTc. She does have a list of medications to avoid, and any new medication should looked up to see if it has any QT prolonging qualities. We'll continue to monitor with EKG in 6 months.  5. Pulmonary hypertension: Follow-up planned with pulmonary.  Current medicines are reviewed at length with the patient today.   The patient does not have concerns regarding her medicines.  The following changes were made today:  none  Labs/ tests ordered today include:  Orders Placed This Encounter  Procedures  . EKG 12-Lead     Disposition:   FU with Demarius Archila 6 months  Signed, Lakenzie Mcclafferty Meredith Leeds, MD  11/16/2016 9:53 AM     [facility] [address] [place] [address] [phone] (office) [phone] (fax)

## 2016-11-16 NOTE — Assessment & Plan Note (Signed)
Appears compensated without evidence of fluid overload.

## 2016-11-16 NOTE — Assessment & Plan Note (Addendum)
Severe Pulmonary HTN - she has had a dramatic response to therapy  Echo showed significant drop in PAP 91>36 on most recent echo 09/2016 .  She is clinically improving and 6 min walk has improved and no desats.  Check LFt   Plan  Patient Instructions  Continue on Letaris 5mg  daily .  Labs today .  follow up with [facility] as planned.  Follow up Dr. Lamonte Sakai  In 3 months and As needed

## 2016-11-16 NOTE — Patient Instructions (Signed)

## 2016-11-20 NOTE — Progress Notes (Signed)
Called spoke with patient's spouse Ilona Sorrel.  Advised of lab results / recs as stated by TP.  Spouse verbalized understanding and denied any questions.

## 2016-11-23 DIAGNOSIS — M359 Systemic involvement of connective tissue, unspecified: Secondary | ICD-10-CM | POA: Diagnosis not present

## 2016-11-23 DIAGNOSIS — L932 Other local lupus erythematosus: Secondary | ICD-10-CM | POA: Diagnosis not present

## 2016-12-10 ENCOUNTER — Telehealth: Payer: Self-pay

## 2016-12-10 DIAGNOSIS — I272 Pulmonary hypertension, unspecified: Secondary | ICD-10-CM

## 2016-12-10 DIAGNOSIS — E1169 Type 2 diabetes mellitus with other specified complication: Secondary | ICD-10-CM

## 2016-12-10 DIAGNOSIS — E8881 Metabolic syndrome: Secondary | ICD-10-CM

## 2016-12-10 DIAGNOSIS — E669 Obesity, unspecified: Secondary | ICD-10-CM

## 2016-12-10 DIAGNOSIS — I10 Essential (primary) hypertension: Secondary | ICD-10-CM

## 2016-12-10 DIAGNOSIS — E559 Vitamin D deficiency, unspecified: Secondary | ICD-10-CM

## 2016-12-10 NOTE — Telephone Encounter (Signed)
Labs ordered.

## 2016-12-11 LAB — TSH: TSH: 6.32 m[IU]/L — AB

## 2016-12-11 LAB — COMPREHENSIVE METABOLIC PANEL
ALK PHOS: 38 U/L (ref 33–130)
ALT: 7 U/L (ref 6–29)
AST: 14 U/L (ref 10–35)
Albumin: 3.5 g/dL — ABNORMAL LOW (ref 3.6–5.1)
BILIRUBIN TOTAL: 0.5 mg/dL (ref 0.2–1.2)
BUN: 17 mg/dL (ref 7–25)
CALCIUM: 7.7 mg/dL — AB (ref 8.6–10.4)
CO2: 31 mmol/L (ref 20–31)
Chloride: 102 mmol/L (ref 98–110)
Creat: 1.04 mg/dL (ref 0.50–1.05)
GLUCOSE: 79 mg/dL (ref 65–99)
Potassium: 3.3 mmol/L — ABNORMAL LOW (ref 3.5–5.3)
SODIUM: 142 mmol/L (ref 135–146)
TOTAL PROTEIN: 6.7 g/dL (ref 6.1–8.1)

## 2016-12-11 LAB — LIPID PANEL
Cholesterol: 151 mg/dL (ref <200)
HDL: 44 mg/dL — ABNORMAL LOW (ref >50)
LDL CALC: 89 mg/dL (ref <100)
Total CHOL/HDL Ratio: 3.4 Ratio (ref <5.0)
Triglycerides: 90 mg/dL (ref <150)
VLDL: 18 mg/dL (ref <30)

## 2016-12-12 ENCOUNTER — Encounter: Payer: Self-pay | Admitting: Family Medicine

## 2016-12-12 ENCOUNTER — Ambulatory Visit (INDEPENDENT_AMBULATORY_CARE_PROVIDER_SITE_OTHER): Payer: 59 | Admitting: Family Medicine

## 2016-12-12 VITALS — BP 120/74 | HR 68 | Resp 16 | Ht 60.0 in | Wt 194.0 lb

## 2016-12-12 DIAGNOSIS — R946 Abnormal results of thyroid function studies: Secondary | ICD-10-CM | POA: Diagnosis not present

## 2016-12-12 DIAGNOSIS — E559 Vitamin D deficiency, unspecified: Secondary | ICD-10-CM | POA: Diagnosis not present

## 2016-12-12 DIAGNOSIS — D126 Benign neoplasm of colon, unspecified: Secondary | ICD-10-CM | POA: Diagnosis not present

## 2016-12-12 DIAGNOSIS — Z9884 Bariatric surgery status: Secondary | ICD-10-CM

## 2016-12-12 DIAGNOSIS — R7989 Other specified abnormal findings of blood chemistry: Secondary | ICD-10-CM

## 2016-12-12 DIAGNOSIS — I272 Pulmonary hypertension, unspecified: Secondary | ICD-10-CM

## 2016-12-12 DIAGNOSIS — E876 Hypokalemia: Secondary | ICD-10-CM | POA: Diagnosis not present

## 2016-12-12 DIAGNOSIS — M359 Systemic involvement of connective tissue, unspecified: Secondary | ICD-10-CM

## 2016-12-12 DIAGNOSIS — K219 Gastro-esophageal reflux disease without esophagitis: Secondary | ICD-10-CM | POA: Diagnosis not present

## 2016-12-12 LAB — HEMOGLOBIN A1C
Hgb A1c MFr Bld: 5.3 % (ref <5.7)
MEAN PLASMA GLUCOSE: 105 mg/dL

## 2016-12-12 LAB — VITAMIN D 25 HYDROXY (VIT D DEFICIENCY, FRACTURES): VIT D 25 HYDROXY: 31 ng/mL (ref 30–100)

## 2016-12-12 MED ORDER — PANTOPRAZOLE SODIUM 40 MG PO TBEC: 40.0000 mg | DELAYED_RELEASE_TABLET | Freq: Every day | ORAL | 3 refills | Status: DC

## 2016-12-12 MED ORDER — POTASSIUM CHLORIDE ER 10 MEQ PO TBCR: 10.0000 meq | EXTENDED_RELEASE_TABLET | Freq: Two times a day (BID) | ORAL | 5 refills | Status: DC

## 2016-12-12 NOTE — Patient Instructions (Addendum)
f/u in October , call if you need  Me before  You are referred to Dr Mechele Collin need rept colonoscopy also for evaluation of upper chest pain possibly due to GERD Start daily protonix which is sent in  New is Potassium 10 meq take two daily  Thyroid function test suggest mild underactivity, you are referred for [place] of gland and to see Dr Dorris Fetch  Thankful health is improved  It is important that you exercise regularly at least 30 minutes 5 times a week. If you develop chest pain, have severe difficulty breathing, or feel very tired, stop exercising immediately and seek medical attention

## 2016-12-16 DIAGNOSIS — R7989 Other specified abnormal findings of blood chemistry: Secondary | ICD-10-CM | POA: Insufficient documentation

## 2016-12-16 NOTE — Assessment & Plan Note (Signed)
Referred for repeat colonoscopy which is past due

## 2016-12-16 NOTE — Assessment & Plan Note (Signed)
Stable, and doing well, followed by pulmonary and on letaris, has f/u appt in July

## 2016-12-16 NOTE — Progress Notes (Signed)
   Sabrina [name]     MRN: [id number]      DOB: 1960/08/19   HPI Sabrina [name] is here for follow up and re-evaluation of chronic medical conditions, medication management and review of any available recent lab and radiology data.  Preventive health is updated, specifically  Cancer screening and Immunization.   Questions or concerns regarding consultations or procedures which the PT has had in the interim are  addressed.She has been evaluated by rheumatology at [facility] and is awaiting insurance approval for a medication prescribed to be approved. The PT denies any adverse reactions to current medications since the last visit.  c/o burning epigastric pain, has bee on reflux medication in the past, denies dysphagia currently, will start protonix, needs colonoscopy, [past due , will refer for GI eval ROS Denies recent fever or chills. Denies sinus pressure, nasal congestion, ear pain or sore throat. Denies chest congestion, productive cough or wheezing. Denies chest pains, palpitations and leg swelling Denies  vomiting,diarrhea or constipation.   Denies dysuria, frequency, hesitancy or incontinence. Denies joint pain, swelling and limitation in mobility. Denies headaches, seizures, numbness, or tingling. Denies depression, anxiety or insomnia.  PE  BP 120/74   Pulse 68   Resp 16   Ht 5' (1.524 m)   Wt 194 lb (88 kg)   LMP 10/31/2002   SpO2 95%   BMI 37.89 kg/m   Patient alert and oriented and in no cardiopulmonary distress.  HEENT: No facial asymmetry, EOMI,   oropharynx pink and moist.  Neck supple no JVD, no mass.  Chest: Clear to auscultation bilaterally.  CVS: S1, S2 no murmurs, no S3.Regular rate.  ABD: Soft mild epigastric tenderness to deep palpation, o guarding or rebound.   Ext: No edema  MS: Adequate ROM spine, shoulders, hips and knees.  Skin: Intact, hyperpigmented macular  rash noted.diffusely Psych: Good eye contact, normal affect. Memory intact not  anxious or depressed appearing.  CNS: CN 2-12 intact, power,  normal throughout.no focal deficits noted.   Assessment & Plan  Tubular adenoma of colon Referred for repeat colonoscopy which is past due  Hypokalemia Supplement with potassium 10 meq twice daily  Connective tissue disease, undifferentiated ([place]) Being evaluated and managed at [facility] through rheumatology , awaiting new medication insurance to approve  Vitamin D deficiency Continue supplement  SECONDARY HYPERPARATHYROIDISM continiueds to have low calcium, tSH elevated on current test, referred to local endo  Hypocalcemia Diagnosed in the past th parathyroid disease, will have endo re eval  Elevated TSH Referred for US thyroid and to endo  Pulmonary hypertension Stable, and doing well, followed by pulmonary and on letaris, has f/u appt in July

## 2016-12-16 NOTE — Assessment & Plan Note (Signed)
Deteriorated. Patient re-educated about  the importance of commitment to a  minimum of 150 minutes of exercise per week.  The importance of healthy food choices with portion control discussed. Encouraged to start a food diary, count calories and to consider  joining a support group. Sample diet sheets offered. Goals set by the patient for the next several months.   Weight /BMI 12/12/2016 11/16/2016 11/16/2016  WEIGHT 194 lb 199 lb 3.2 oz 200 lb  HEIGHT 5\' 0"  5\' 0"  5\' 0"   BMI 37.89 kg/m2 38.9 kg/m2 39.06 kg/m2

## 2016-12-16 NOTE — Assessment & Plan Note (Signed)
Supplement with potassium 10 meq twice daily

## 2016-12-16 NOTE — Assessment & Plan Note (Signed)
Referred for US thyroid and to endo

## 2016-12-16 NOTE — Assessment & Plan Note (Signed)
continiueds to have low calcium, tSH elevated on current test, referred to local endo

## 2016-12-16 NOTE — Assessment & Plan Note (Signed)
Continue supplement. 

## 2016-12-16 NOTE — Assessment & Plan Note (Signed)
Diagnosed in the past th parathyroid disease, will have endo re eval

## 2016-12-16 NOTE — Assessment & Plan Note (Signed)
Being evaluated and managed at [facility] through rheumatology , awaiting new medication insurance to approve

## 2016-12-20 ENCOUNTER — Ambulatory Visit (HOSPITAL_COMMUNITY): Admission: RE | Admit: 2016-12-20 | Payer: 59 | Source: Ambulatory Visit

## 2017-01-08 ENCOUNTER — Ambulatory Visit (INDEPENDENT_AMBULATORY_CARE_PROVIDER_SITE_OTHER): Payer: 59 | Admitting: "Endocrinology

## 2017-01-08 ENCOUNTER — Encounter: Payer: Self-pay | Admitting: "Endocrinology

## 2017-01-08 DIAGNOSIS — E038 Other specified hypothyroidism: Secondary | ICD-10-CM

## 2017-01-08 DIAGNOSIS — E039 Hypothyroidism, unspecified: Secondary | ICD-10-CM

## 2017-01-08 NOTE — Progress Notes (Signed)
Subjective:    Patient ID: Sabrina [name], female    DOB: 1961/03/12, PCP Fayrene Helper, MD   Past Medical History:  Diagnosis Date  . Allergy   . Anemia   . Arthritis   . Asthma   . Chronic bronchitis ([place])   . Diabetes mellitus   . Endometriosis   . Fibroid uterus   . GERD (gastroesophageal reflux disease)   . History of Doppler ultrasound    a. Carotid US 8/08:Estimated stenosis in the right and left internal carotid arteries is 0-50% and 0-50%.  . History of echocardiogram    a. Echo 6/16: EF 60-65%, no RWMA.  . History of hiatal hernia   . History of nuclear stress test    a. Myoview 6/16: There is no resting or stress perfusion defect consistent with no prior infarct and no ischemia. EF 73 %  The patient was hypertensive prior and during the study. Cardiac cath 04/2016 showed normal coronary arteries  . Hx of iron deficiency anemia   . Hypersomnia with sleep apnea   . Hypertension   . 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 Menorrhagia   . Obesity   . Pneumonia   . 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 Secondary hyperthyroidism   . Thyroid disease   . Tinnitus    left ear  . Vertigo   . Vitamin D deficiency    Past Surgical History:  Procedure Laterality Date  . ABDOMINAL HYSTERECTOMY  2004   supracervical hysterectomy  . BREAST SURGERY  2005   reduction  . BREATH TEK H PYLORI [name] 12/24/2014   Procedure: BREATH TEK H PYLORI;  Surgeon: Greer Pickerel, MD;  Location: [facility];  Service: General;  Laterality: [name];  . CARDIAC CATHETERIZATION [name] 03/22/2016   Procedure: Right/Left Heart Cath and Coronary Angiography;  Surgeon: Troy Sine, MD;  Location: [place] [facility];  Service: Cardiovascular;  Laterality: [name];  . DILATION AND CURETTAGE OF UTERUS     age 50  . ESOPHAGOGASTRODUODENOSCOPY (EGD) WITH PROPOFOL [name] 10/14/2015   Procedure: ESOPHAGOGASTRODUODENOSCOPY (EGD) WITH PROPOFOL;  Surgeon: Clarene Essex, MD;  Location: [facility];  Service: Endoscopy;   Laterality: [name];  . LAPAROSCOPIC GASTRIC SLEEVE RESECTION WITH HIATAL HERNIA REPAIR [name] 07/05/2015   Procedure: LAPAROSCOPIC GASTRIC SLEEVE RESECTION WITH HIATAL HERNIA REPAIR, upper endoscopy;  Surgeon: Greer Pickerel, MD;  Location: [facility];  Service: General;  Laterality: [name];   Social History   Social History  . Marital status: Married    Spouse name: [name]  . Number of children: [name]  . Years of education: 63   Occupational History  . 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 Smoking status: Never Smoker  . Smokeless tobacco: Never Used  . Alcohol use No  . Drug use: No  . Sexual activity: Yes    Partners: Male    Birth control/ protection: Surgical     Comment: hysterectomy   Other Topics Concern  . None   Social History Narrative   Regular exercise-no   Caffeine Use-no      [facility]: Patient lives with her husband. Works for [employer]. No bird or mold exposure. No pets currently.            Outpatient Encounter Prescriptions as of 01/08/2017  Medication Sig  . acetaminophen (TYLENOL) 500 MG tablet Take 500 mg by mouth every 6 (six) hours as needed for headache.  [name] ambrisentan (  LETAIRIS) 5 MG tablet Take 5 mg by mouth daily.  [name] aspirin EC 81 MG tablet Take 1 tablet (81 mg total) by mouth daily.  . ergocalciferol (VITAMIN D2) 50000 units capsule Take 1 capsule (50,000 Units total) by mouth once a week.  . furosemide (LASIX) 40 MG tablet Take 1 tablet (40 mg total) by mouth daily.  . Multiple Vitamin (MULTIVITAMIN WITH MINERALS) TABS tablet Take 1 tablet by mouth daily.  . nitroGLYCERIN (NITROSTAT) 0.4 MG SL tablet Place 0.4 mg under the tongue every 5 (five) minutes as needed for chest pain (3 DOSES MAX).  [name] pantoprazole (PROTONIX) 40 MG tablet Take 1 tablet (40 mg total) by mouth daily.  . potassium chloride (K-DUR) 10 MEQ tablet Take 1 tablet (10 mEq total) by mouth 2 (two) times daily.  [name] triamcinolone ointment  (KENALOG) 0.1 % APPLY AS DIRECTED TO ITCHING SPOTS ON THE BODY DAILY TO TWICE DAILY AS NEEDED. NOT TO FACE.   No facility-administered encounter medications on file as of 01/08/2017.    ALLERGIES: Allergies  Allergen Reactions  . Peanut-Containing Drug Products Nausea And Vomiting  . Potassium Iodide Other (See Comments)    facial swelling, also eyes, and ears  . Ace Inhibitors Cough  . Codeine Nausea And Vomiting  . Doxycycline Hives  . Peanut Oil Swelling  . Amlodipine Rash  . Latex Hives and Rash  . Penicillins Hives, Itching and Rash    .Marland KitchenHas patient had a PCN reaction causing immediate rash, facial/tongue/throat swelling, SOB or lightheadedness with hypotension: No Has patient had a PCN reaction causing severe rash involving mucus membranes or skin necrosis: No Has patient had a PCN reaction that required hospitalization No Has patient had a PCN reaction occurring within the last 10 years: YES (3 years ago)  If all of the above answers are "NO", then may proceed with Cephalosporin use.     VACCINATION STATUS: Immunization History  Administered Date(s) Administered  . Influenza Split 06/22/2011  . Influenza Whole 07/11/2006  . Influenza,inj,Quad PF,36+ Mos 06/26/2013, 05/11/2014, 06/13/2015  . Influenza-Unspecified 06/06/2016  . Pneumococcal Conjugate-13 02/07/2015  . Pneumococcal Polysaccharide-23 06/26/2013  . Td 04/11/2004  . Tdap 05/09/2015    HPI   56 year old female patient with medical history as above. She is being seen in consultation for elevated TSH and hypocalcemia requested by her PMD, [name], M.D. -She is known to have hypocalcemia for at least 4 years however etiology not clear. She denies any parathyroid surgery nor any prior history of parathyroid dysfunction. She denies any family history of parathyroid, thyroid, adrenal, pituitary dysfunction. -She is not on any calcium supplements however she is on vitamin D 50,000 units weekly. -She has  never required thyroid hormone replacement. She was diagnosed with what appears to be secondary hyperparathyroidism in the past. Her most recent thyroid function test involves only TSH of 6.32. -She has history of multinodular goiter with biopsy of one of her nodules consistent with hyperblastic nodule. -She denies dysphagia, shortness of breath, voice change. - She has a history of prolonged QT interval. Her most recent labs showed calcium of 7.7 albumin 3.5 giving corrected calcium level of 8.1.  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  Neurological: no tremors, no numbness, no tingling, no dizziness Psychiatric: no depression, no anxiety  Objective:    BP 121/77   Pulse 73   Ht 5' (1.524 m)   Wt 198 lb (89.8 kg)   LMP 10/31/2002   BMI 38.67 kg/m   Wt Readings from Last 3 Encounters:  01/08/17 198 lb (89.8 kg)  12/12/16 194 lb (88 kg)  11/16/16 199 lb 3.2 oz (90.4 kg)    Physical Exam  Constitutional: Over weight for hight, not in acute distress, normal state of mind Eyes: PERRLA, EOMI, no exophthalmos ENT: moist mucous membranes, + she has tight neck making it difficult to palpate for the thyroid, no cervical lymphadenopathy Cardiovascular: normal precordial activity, Regular Rate and Rhythm, no Murmur/Rubs/Gallops Respiratory:  adequate breathing efforts, no gross chest deformity, Clear to auscultation bilaterally Gastrointestinal: abdomen soft, Non -tender, No distension, Bowel Sounds present Musculoskeletal: no gross  deformities, strength intact in all four extremities Skin: moist, warm, diffuse rash from lupus and eczema. Neurological: no tremor with outstretched hands, Deep tendon reflexes normal in all four extremities.  CMP ( most recent) CMP     Component Value Date/Time   NA 142 12/11/2016 0743   K 3.3 (L) 12/11/2016 0743   CL 102 12/11/2016 0743   CO2 31 12/11/2016 0743   GLUCOSE 79 12/11/2016 0743   BUN 17 12/11/2016 0743   CREATININE 1.04 12/11/2016 0743   CALCIUM 7.7 (L) 12/11/2016 0743   CALCIUM 7.8 (L) 12/02/2008 2239   PROT 6.7 12/11/2016 0743   ALBUMIN 3.5 (L) 12/11/2016 0743   AST 14 12/11/2016 0743   ALT 7 12/11/2016 0743   ALKPHOS 38 12/11/2016 0743   BILITOT 0.5 12/11/2016 0743   GFRNONAA 55 (L) 08/01/2016 0458   GFRNONAA 58 (L) 03/10/2016 1038   GFRAA >60 08/01/2016 0458   GFRAA 67 03/10/2016 1038     Diabetic Labs (most recent): Lab Results  Component Value Date   HGBA1C 5.3 12/11/2016   HGBA1C 6.3 (H) 03/10/2016   HGBA1C 6.2 (H) 08/22/2015     Lipid Panel ( most recent) Lipid Panel     Component Value Date/Time   CHOL 151 12/11/2016 0743   TRIG 90 12/11/2016 0743   HDL 44 (L) 12/11/2016 0743   CHOLHDL 3.4 12/11/2016 0743   VLDL 18 12/11/2016 0743   LDLCALC 89 12/11/2016 0743     Assessment & Plan:   1. 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 Her most recent corrected calcium is 8.1 mg/dL. I would not prescribe calcium at this time, however I will  seek to obtain PTH/calcium. If her PTH is elevated, she will be considered for 24-hour urine calcium measurement.  2. Subclinical hypothyroidism - Based on her most recent TSH of 6.32, she has subclinical hypothyroidism. However there is a documented record of secondary hyperthyroidism although she was never offered any therapy for it. - I will proceed to obtain new  full profile thyroid function tests including TSH/free T4, free T3, TPO antibodies, and thyroglobulin  antibodies. I will also proceed to obtain repeat tired sonogram to study nodule size.  - I advised patient to maintain close follow up with Fayrene Helper, MD for primary care needs. Follow up plan: Return in about 1 week (around 01/15/2017) for follow up with pre-visit labs, labs today, Thyroid / Neck Ultrasound.  Glade Lloyd, MD Phone: [phone]  Fax: [phone]   01/08/2017, 5:07  PM

## 2017-01-21 ENCOUNTER — Ambulatory Visit (HOSPITAL_COMMUNITY): Admission: RE | Admit: 2017-01-21 | Payer: 59 | Source: Ambulatory Visit

## 2017-01-21 LAB — TSH: TSH: 6.49 mIU/L — ABNORMAL HIGH

## 2017-01-21 LAB — T3, FREE: T3, Free: 2.3 pg/mL (ref 2.3–4.2)

## 2017-01-22 LAB — PTH, INTACT AND CALCIUM
Calcium: 8 mg/dL — ABNORMAL LOW (ref 8.6–10.4)
PTH: 114 pg/mL — ABNORMAL HIGH (ref 14–64)

## 2017-01-22 LAB — THYROID PEROXIDASE ANTIBODY: THYROID PEROXIDASE ANTIBODY: 1 [IU]/mL (ref <9)

## 2017-01-22 LAB — THYROGLOBULIN ANTIBODY: Thyroglobulin Ab: <1 IU/mL (ref <2)

## 2017-01-25 ENCOUNTER — Other Ambulatory Visit: Payer: Self-pay | Admitting: "Endocrinology

## 2017-01-25 ENCOUNTER — Encounter: Payer: Self-pay | Admitting: *Deleted

## 2017-01-25 ENCOUNTER — Telehealth: Payer: Self-pay | Admitting: *Deleted

## 2017-01-25 ENCOUNTER — Ambulatory Visit: Payer: 59 | Admitting: "Endocrinology

## 2017-01-25 DIAGNOSIS — E039 Hypothyroidism, unspecified: Secondary | ICD-10-CM

## 2017-01-25 NOTE — Telephone Encounter (Signed)
Patient changed care to Dr Watt Climes at [facility] when she saw him in office on 10/05/15 and had endoscopy with him on 10/14/15. Therefore, she needs to have her records from [facility] sent for review to Dr Hilarie Fredrickson to get an okay to transfer care back to [facility]. Lorriane Shire states she will contact patient to advise of this.

## 2017-02-01 ENCOUNTER — Ambulatory Visit: Payer: 59 | Admitting: Certified Nurse Midwife

## 2017-02-01 DIAGNOSIS — M359 Systemic involvement of connective tissue, unspecified: Secondary | ICD-10-CM | POA: Diagnosis not present

## 2017-02-01 DIAGNOSIS — Z79899 Other long term (current) drug therapy: Secondary | ICD-10-CM | POA: Diagnosis not present

## 2017-02-01 NOTE — Progress Notes (Signed)
SIX MIN WALK 11/16/2016 07/27/2016 05/25/2016  Medications no meds this morning Pt. did not take any medication this morning -  Supplimental Oxygen during Test? (L/min) No No No  Laps 6 6 -  Partial Lap (in Meters) 41 3 -  Baseline BP (sitting) 124/80 108/74 -  Baseline Heartrate 77 98 -  Baseline Dyspnea (Borg Scale) 0 3 -  Baseline Fatigue (Borg Scale) 0 3 -  Baseline SPO2 99 97 -  BP (sitting) 142/90 118/70 -  Heartrate 119 108 -  Dyspnea (Borg Scale) 1 2 -  Fatigue (Borg Scale) 1 2 -  SPO2 97 92 -  BP (sitting) 138/82 112/72 -  Heartrate 81 84 -  SPO2 98 97 -  Stopped or Paused before Six Minutes No No -  Distance Completed 329 291 -  Tech Comments: pt walked normal pace ( per pt), no desat --amg Pt. walked at a steady slow pace, she did well on her walk Pt. walked at a steady pace, with no breaks

## 2017-02-04 ENCOUNTER — Encounter (HOSPITAL_COMMUNITY): Payer: Self-pay

## 2017-02-04 ENCOUNTER — Ambulatory Visit: Payer: 59 | Admitting: Internal Medicine

## 2017-02-15 ENCOUNTER — Ambulatory Visit (INDEPENDENT_AMBULATORY_CARE_PROVIDER_SITE_OTHER): Payer: 59 | Admitting: Certified Nurse Midwife

## 2017-02-15 ENCOUNTER — Encounter: Payer: Self-pay | Admitting: Certified Nurse Midwife

## 2017-02-15 VITALS — BP 120/70 | HR 70 | Resp 16 | Ht 59.5 in | Wt 193.0 lb

## 2017-02-15 DIAGNOSIS — L930 Discoid lupus erythematosus: Secondary | ICD-10-CM

## 2017-02-15 DIAGNOSIS — Z01419 Encounter for gynecological examination (general) (routine) without abnormal findings: Secondary | ICD-10-CM

## 2017-02-15 DIAGNOSIS — N951 Menopausal and female climacteric states: Secondary | ICD-10-CM

## 2017-02-15 DIAGNOSIS — Z8739 Personal history of other diseases of the musculoskeletal system and connective tissue: Secondary | ICD-10-CM

## 2017-02-15 NOTE — Patient Instructions (Signed)

## 2017-02-15 NOTE — Progress Notes (Signed)
56 y.o. G0P0000 Married  African American Fe here for annual exam. Menopausal no hot flashes or night sweats. Denies vaginal bleeding or vaginal dryness.Not sexually active now, spouse on medication for hypertension and has ED. Sees PCP every 3 months for labs and aex. Heart and lung specialist manages other medication. 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 She has more skin pigmentation changes, now and is not happy with the diagnosis, but at least she knows what to expect now. Will be starting new medication and hopeful this will help with skin changes.Has continued to lose weight since gastric sleeve surgery. Still has rash from moisture under abdomen, but manages with protective cream. Currently under evaluation for secondary hyperparathyroidism. No other health issues today.  Patient's last menstrual period was 10/31/2002.          Sexually active: Yes.    The current method of family planning is status post hysterectomy.   (supracervical) Exercising: Yes.    walking & zumba Smoker:  no  Health Maintenance: Pap:  11-13-14 neg History of Abnormal Pap: no MMG 3 D  07-12-16 category d density birads 1:neg Self Breast exams: yes Colonoscopy:  07/2013 polyps f/u 32yrs BMD:   2006 declines scheduuling TDaP:  2016 Shingles: no Pneumonia: 2016 Hep C and HIV: both neg 2016 Labs: none   reports that she has never smoked. She has never used smokeless tobacco. She reports that she does not drink alcohol or use drugs.  Past Medical History:  Diagnosis Date  . Allergy   . Anemia   . Arthritis   . Asthma   . Chronic bronchitis ([place])   . Connective tissue disease ([place])   . Diabetes mellitus   . Endometriosis   . Fibroid uterus   . GERD (gastroesophageal reflux disease)   . History of Doppler ultrasound    a. Carotid US 8/08:Estimated stenosis in the right and left internal carotid arteries is 0-50% and 0-50%.  . History of  echocardiogram    a. Echo 6/16: EF 60-65%, no RWMA.  . History of hiatal hernia   . History of nuclear stress test    a. Myoview 6/16: There is no resting or stress perfusion defect consistent with no prior infarct and no ischemia. EF 73 %  The patient was hypertensive prior and during the study. Cardiac cath 04/2016 showed normal coronary arteries  . Hx of iron deficiency anemia   . Hypersomnia with sleep apnea   . Hypertension   . 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 Menorrhagia   . Obesity   . Pneumonia   . 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 Secondary hyperthyroidism   . Thyroid disease   . Tinnitus    left ear  . Tubular adenoma of colon   . Vertigo   . Vitamin D deficiency     Past Surgical History:  Procedure Laterality Date  . ABDOMINAL HYSTERECTOMY  2004   supracervical hysterectomy  . BREAST SURGERY  2005   reduction  . BREATH TEK H PYLORI [name] 12/24/2014   Procedure: BREATH TEK H PYLORI;  Surgeon: Greer Pickerel, MD;  Location: [facility];  Service: General;  Laterality: [name];  . CARDIAC CATHETERIZATION [name] 03/22/2016   Procedure: Right/Left Heart Cath and Coronary Angiography;  Surgeon: Troy Sine, MD;  Location: [place] [facility];  Service: Cardiovascular;  Laterality: [name];  . DILATION AND CURETTAGE OF UTERUS     age 24  .  ESOPHAGOGASTRODUODENOSCOPY (EGD) WITH PROPOFOL [name] 10/14/2015   Procedure: ESOPHAGOGASTRODUODENOSCOPY (EGD) WITH PROPOFOL;  Surgeon: Clarene Essex, MD;  Location: [facility];  Service: Endoscopy;  Laterality: [name];  . LAPAROSCOPIC GASTRIC SLEEVE RESECTION WITH HIATAL HERNIA REPAIR [name] 07/05/2015   Procedure: LAPAROSCOPIC GASTRIC SLEEVE RESECTION WITH HIATAL HERNIA REPAIR, upper endoscopy;  Surgeon: Greer Pickerel, MD;  Location: [facility];  Service: General;  Laterality: [name];    Current Outpatient Prescriptions  Medication Sig Dispense Refill  . acetaminophen (TYLENOL) 500 MG tablet Take 500 mg by mouth every 6 (six) hours as needed for headache.    [name]  ambrisentan (LETAIRIS) 5 MG tablet Take 5 mg by mouth daily.    [name] aspirin EC 81 MG tablet Take 1 tablet (81 mg total) by mouth daily.    . ergocalciferol (VITAMIN D2) 50000 units capsule Take 1 capsule (50,000 Units total) by mouth once a week. 4 capsule 3  . furosemide (LASIX) 40 MG tablet Take 1 tablet (40 mg total) by mouth daily. 30 tablet 11  . Multiple Vitamin (MULTIVITAMIN WITH MINERALS) TABS tablet Take 1 tablet by mouth daily.    . mycophenolate (CELLCEPT) 500 MG tablet     . nitroGLYCERIN (NITROSTAT) 0.4 MG SL tablet Place 0.4 mg under the tongue every 5 (five) minutes as needed for chest pain (3 DOSES MAX).    [name] pantoprazole (PROTONIX) 40 MG tablet Take 1 tablet (40 mg total) by mouth daily. 30 tablet 3  . potassium chloride (K-DUR) 10 MEQ tablet Take 1 tablet (10 mEq total) by mouth 2 (two) times daily. 60 tablet 5  . triamcinolone ointment (KENALOG) 0.1 % APPLY AS DIRECTED TO ITCHING SPOTS ON THE BODY DAILY TO TWICE DAILY AS NEEDED. NOT TO FACE.     No current facility-administered medications for this visit.     Family History  Problem Relation Age of Onset  . Hypertension Mother   . Diabetes Mother   . Arthritis Mother   . Depression Mother   . CAD Mother        s/p PCI  . Diabetes Father   . Stroke Father   . Arthritis Father   . Depression Father   . Hypertension Father   . Cancer Father        prostate  . Hypertension Sister        x 2  . Breast cancer Sister   . Arthritis Sister   . Bleeding Disorder Sister   . Depression Sister   . Thyroid disease Sister   . Colon cancer Maternal Grandfather   . Stroke Maternal Aunt   . Heart attack Neg Hx     ROS:  Pertinent items are noted in HPI.  Otherwise, a comprehensive ROS was negative.  Exam:   BP 120/70   Pulse 70   Resp 16   Ht 4' 11.5" (1.511 m)   Wt 193 lb (87.5 kg)   LMP 10/31/2002   BMI 38.33 kg/m  Height: 4' 11.5" (151.1 cm) Ht Readings from Last 3 Encounters:  02/15/17 4' 11.5" (1.511 m)   01/08/17 5' (1.524 m)  12/12/16 5' (1.524 m)    General appearance: alert, cooperative and appears stated age Head: Normocephalic, without obvious abnormality, atraumatic Neck: no adenopathy, supple, symmetrical, trachea midline and thyroid normal to inspection and palpation Lungs: clear to auscultation bilaterally Breasts: normal appearance, no masses or tenderness, No nipple retraction or dimpling, No nipple discharge or bleeding, No axillary or supraclavicular adenopathy Heart: regular rate and rhythm Abdomen:  soft, non-tender; no masses,  no organomegaly Extremities: extremities normal, atraumatic, no cyanosis or edema Skin: Skin color, texture, turgor normal. No rashes or lesions Lymph nodes: Cervical, supraclavicular, and axillary nodes normal. No abnormal inguinal nodes palpated Neurologic: Grossly normal   Pelvic: External genitalia:  no lesions              Urethra:  normal appearing urethra with no masses, tenderness or lesions              Bartholin's and Skene's: normal                 Vagina: normal appearing vagina with normal color and discharge, no lesions              Cervix: no cervical motion tenderness, no lesions and normal appearance              Pap taken: No. Bimanual Exam:  Uterus:  uterus absent              Adnexa: no mass, fullness, tenderness               Rectovaginal: Confirms               Anus:  normal sphincter tone, no lesions  Chaperone present: yes  A:  Well Woman with normal exam  Menopausal no HRT, S/P TAH supracervical for fibroids, and bleeding. Ovaries retained  Vaginal dryness uses coconut oil with good results.  Scleraderma/Lupus new diagnosis being followed at [facility], legs slightly better with Lupus skin changes  PCP management of some labs, hypertension.  Cardiology/Pulmonary management for chronic diastolic heart failure,   all medications and labs and exam every 3- 6 months. All stable per patient.  Under evaluation for secondary  hyperparathyroidism, has [place] scheduled  History of gastric sleeve with continued controlled weight loss    P:   Reviewed health and wellness pertinent to exam  Aware of need to evaluate if vaginal bleeding.  Will advise if vaginal dryness changes  Continue follow up as indicated with MD with numerous medical issues.  Pap smear: no  counseled on breast self exam, mammography screening, menopause, adequate intake of calcium and vitamin D, diet and exercise  return annually or prn  An After Visit Summary was printed and given to the patient.

## 2017-02-20 ENCOUNTER — Telehealth: Payer: Self-pay

## 2017-02-20 NOTE — Telephone Encounter (Signed)
Call patient to let her know that due to new literature that pt should have pap smear done due to skin lupus. Schedule pap appt

## 2017-02-20 NOTE — Telephone Encounter (Signed)
Left message for call back.

## 2017-02-21 NOTE — Telephone Encounter (Signed)
Patient notified. Pt has appt scheduled for 03-08-17 at 10am. Pt has another appt in [place] at 11. Pt to arrive here early at 9:30

## 2017-02-21 NOTE — Telephone Encounter (Signed)
Patient returned call and gave a preferred number for today at: [phone].

## 2017-02-22 ENCOUNTER — Ambulatory Visit: Payer: 59 | Admitting: Emergency Medicine

## 2017-03-07 ENCOUNTER — Ambulatory Visit (HOSPITAL_COMMUNITY): Admission: RE | Admit: 2017-03-07 | Payer: 59 | Source: Ambulatory Visit

## 2017-03-08 ENCOUNTER — Ambulatory Visit (INDEPENDENT_AMBULATORY_CARE_PROVIDER_SITE_OTHER): Payer: 59 | Admitting: Emergency Medicine

## 2017-03-08 ENCOUNTER — Ambulatory Visit (INDEPENDENT_AMBULATORY_CARE_PROVIDER_SITE_OTHER): Payer: 59 | Admitting: Certified Nurse Midwife

## 2017-03-08 ENCOUNTER — Other Ambulatory Visit (HOSPITAL_COMMUNITY)
Admission: RE | Admit: 2017-03-08 | Discharge: 2017-03-08 | Disposition: A | Discharge status: Home / Self Care | Payer: 59 | Source: Ambulatory Visit | Attending: Certified Nurse Midwife | Admitting: Certified Nurse Midwife

## 2017-03-08 ENCOUNTER — Other Ambulatory Visit (INDEPENDENT_AMBULATORY_CARE_PROVIDER_SITE_OTHER): Payer: 59

## 2017-03-08 ENCOUNTER — Encounter: Payer: Self-pay | Admitting: Certified Nurse Midwife

## 2017-03-08 ENCOUNTER — Encounter: Payer: Self-pay | Admitting: Emergency Medicine

## 2017-03-08 VITALS — BP 106/64 | HR 80 | Ht 59.5 in | Wt 193.0 lb

## 2017-03-08 DIAGNOSIS — I272 Pulmonary hypertension, unspecified: Secondary | ICD-10-CM | POA: Diagnosis not present

## 2017-03-08 DIAGNOSIS — IMO0002 Reserved for concepts with insufficient information to code with codable children: Secondary | ICD-10-CM

## 2017-03-08 DIAGNOSIS — Z124 Encounter for screening for malignant neoplasm of cervix: Secondary | ICD-10-CM | POA: Insufficient documentation

## 2017-03-08 DIAGNOSIS — M329 Systemic lupus erythematosus, unspecified: Secondary | ICD-10-CM

## 2017-03-08 DIAGNOSIS — Z8739 Personal history of other diseases of the musculoskeletal system and connective tissue: Secondary | ICD-10-CM | POA: Diagnosis not present

## 2017-03-08 DIAGNOSIS — M359 Systemic involvement of connective tissue, unspecified: Secondary | ICD-10-CM

## 2017-03-08 LAB — CBC WITH DIFFERENTIAL/PLATELET
Basophils Absolute: 0 K/uL (ref 0.0–0.1)
Basophils Relative: 0.5 % (ref 0.0–3.0)
Eosinophils Absolute: 0.4 K/uL (ref 0.0–0.7)
Eosinophils Relative: 5.2 % — ABNORMAL HIGH (ref 0.0–5.0)
HCT: 33.9 % — ABNORMAL LOW (ref 36.0–46.0)
Hemoglobin: 11 g/dL — ABNORMAL LOW (ref 12.0–15.0)
Lymphocytes Relative: 24.4 % (ref 12.0–46.0)
Lymphs Abs: 1.9 K/uL (ref 0.7–4.0)
MCHC: 32.4 g/dL (ref 30.0–36.0)
MCV: 84.2 fl (ref 78.0–100.0)
Monocytes Absolute: 0.4 K/uL (ref 0.1–1.0)
Monocytes Relative: 5.4 % (ref 3.0–12.0)
Neutro Abs: 5.1 K/uL (ref 1.4–7.7)
Neutrophils Relative %: 64.5 % (ref 43.0–77.0)
Platelets: 275 K/uL (ref 150.0–400.0)
RBC: 4.02 Mil/uL (ref 3.87–5.11)
RDW: 14.8 % (ref 11.5–15.5)
WBC: 7.9 K/uL (ref 4.0–10.5)

## 2017-03-08 LAB — HEPATIC FUNCTION PANEL
ALT: 6 U/L (ref 0–35)
AST: 12 U/L (ref 0–37)
Albumin: 3.9 g/dL (ref 3.5–5.2)
Alkaline Phosphatase: 35 U/L — ABNORMAL LOW (ref 39–117)
Bilirubin, Direct: 0.1 mg/dL (ref 0.0–0.3)
Total Bilirubin: 0.4 mg/dL (ref 0.2–1.2)
Total Protein: 7.5 g/dL (ref 6.0–8.3)

## 2017-03-08 MED ORDER — SILDENAFIL CITRATE 20 MG PO TABS: 20.0000 mg | ORAL_TABLET | Freq: Three times a day (TID) | ORAL | 5 refills | Status: DC

## 2017-03-08 NOTE — Assessment & Plan Note (Signed)
Currently managed with Letairis 5 mg. She did not tolerate Adcirca due to severe side effects. We will try sildenifil to see if better tolerated.   Please continue your Myrtis Hopping as you are taking it.  We will not restart Adcirca at this time We will initiate the approval process to start Revatio to see if you tolerate it better.  We will plan to follow a repeat echocardiogram sometime at the end of the year to assess the effects of your PAH medications.  Blood work today Follow with Dr Lamonte Sakai in 3 months or sooner if you have any problems.

## 2017-03-08 NOTE — Progress Notes (Signed)
.  56 y.o. G0P0000 MarriedCaucasian  F here for  Pap smear due to history of Lupus and current recommendations of yearly pap smear. Patient  denies vaginal symptoms or STD concerns.  Patient's last menstrual period was 10/31/2002.[name]  Contraception:  EXAM: BP 106/64 (BP Location: Left Arm, Patient Position: Sitting, Cuff Size: Large)   Pulse 80   Ht 4' 11.5" (1.511 m)   Wt 193 lb (87.5 kg)   LMP 10/31/2002   BMI 38.33 kg/m  General appearance:  WNWD Female, NAD Pelvic exam: VULVA: normal appearing vulva with no masses, tenderness or lesions,  VAGINA: normal appearing vagina with normal color and discharge, no lesions,  CERVIX: normal appearing cervix without discharge or lesions, pap obtained ,  UTERUS: uterus is normal size, shape, consistency and nontender,  ADNEXA: normal adnexa in size, nontender and no masses. Pap smear obtained.  Assessment: History of Lupus under treatment  Pap smear not collected on aex, but new recommendation were called to patient and pap smear collected today. Normal pelvic exam  Plan: Discussed normal pelvic exam finding and current recommendations of yearly pap with some health problems and Lupus is considered one that might increase her risk of cervical change. Patient appreciative to call and information . Questions addressed.  Rv aex , prn

## 2017-03-08 NOTE — Patient Instructions (Addendum)
Please continue your Sabrina [name] as you are taking it.  We will not restart Adcirca at this time We will initiate the approval process to start Revatio to see if you tolerate it better.  We will plan to follow a repeat echocardiogram sometime at the end of the year to assess the effects of your PAH medications.  Blood work today Follow with Dr Lamonte Sakai in 3 months or sooner if you have any problems.

## 2017-03-08 NOTE — Progress Notes (Signed)
Subjective:    Patient ID: Sabrina [name], female    DOB: 1960/08/24, 56 y.o.   MRN: [id number]  HPI Sabrina [name] is a 56 year old never smoker with a history of diastolic CHF, systemic hypertension, diabetes, obesity with ?? obstructive sleep apnea. She had a sleep study at [facility] before her gastric sgy. With a history of possible Lupus followed at [facility] and locally by Sabrina [name]. She was seen by Rheum and it sounds like she was serology negative. Underwent gastric sleeve for wt loss (2016) by Sabrina [name], well tolerated. She took Phen-Fen for about 6 months, about 2 years. Former MTX use  She has been under evaluation for her CHF and is followed at [facility]. She has been on lasix for the last month or so, feels that her breathing is better.  She underwent left and right cardiac catheterization 03/22/16 as detailed below, shows significant elevation PA pressures. No MV or TV disease noted.   ROV 06/22/16 -- This follow-up visit for evaluation of severe pulmonary hypertension. She has a history as outlined above that includes diastolic CHF, suspected obstructive sleep apnea, possible connective tissue disease, and diet pill use. We have performed testing since last visit. She has had a low probability ventilation/perfusion scan 06/04/16, and normal chest x-ray. She has a positive ANA from 05/25/16 with a titer of 1:1280, centromeric pattern. Rheumatoid factor elevated at 36. Home sleep study is pending. Walking oximetry last time was normal on RA.   She has been having dry cough, may be due to increased allergy sx. She does not feel exertional SOB, is able to exert. She takes stairs slower but is able to climb. She has seen Sabrina [name] and was just started on hydroxychloriquine this week, for skin symptoms.    ROV 07/27/16 -- This is a follow-up visit for severe pulmonary hypertension in the setting of diastolic CHF, newly identified connective tissue disease. She also has a history of  diet pill use in the remote past. No evidence to support chronic thromboembolic disease. Since our last visit she has been started on [place]. 6 minute walk was performed today. She was able to ambulate 291 m without any evidence of desaturation.  She had significant side effects when she started both > diarrhea, severe HA. She stopped both on 12/12. She is now on Plaquenil, blood work to be followed by Sabrina [name]. She is about to start timolol for prolonged QT-c per cardiology plans.   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 She is on Adcirca 20mg  and Letaris 5mg , now on for about 2.5 months. She still has HA after the Adcirca. She has noticed a significant improvement in her breathing - able to exert much better. Her AHI on home sleep study was 54 / hour.   ROV 03/08/17 -- patient follows up today for her history of severe pulmonary hypertension in the setting of connective tissue disease, diastolic CHF, prior weight loss drug use. Also with a history of diastolic dysfunction. Since last time she had to stop Adcirca due to diarrhea and HA. She remains on Letaris. She is seeing Rheum at [facility] now, remaisn on Plaquanil. Mycophenolate added in June - has helped with skin thickness in her LE's. Her TTE from 2/18 was done on both PAH meds. She hasn't noticed that stopping the Adcirca has bothered her breathing. No significant swelling, no CP, no syncope or pre-syncope. We deferred  CPAP therapy   [facility] 03/22/16  The left ventricular systolic function is normal.  The left ventricular ejection fraction is 55-65% by visual estimate.  Hemodynamic findings consistent with severe pulmonary hypertension.  PASP 114 mmHg Severe right ventricular systolic pressure and pulmonary hypertension with systolic pressures greater than 100 mmHg. Significant pulmonary capillary/LV gradient, without previous evidence of mitral stenosis on  echocardiography. Normal coronary arteries. Normal LV systolic function with left ventricular hypertrophy. RECOMMENDATION: The patient will undergo 2-D echo Doppler study later today prior to being discharged. The etiology of her severe pulmonary hypertension is unknown and consider possible primary pulmonary hypertension. Echo Doppler study will be helpful to further assess her mitral valve and potential for RV volume/pressure overload  TTE 09/14/16 >> good LV function, mildly dilated right ventricle, peak PA pressure 36 mm Hg  Review of Systems  Constitutional: Negative.  Negative for fever and unexpected weight change.  HENT: Positive for sinus pressure and sneezing. Negative for congestion, dental problem, ear pain, nosebleeds, postnasal drip, rhinorrhea, sore throat and trouble swallowing.   Eyes: Positive for itching. Negative for redness.  Respiratory: Positive for cough, shortness of breath and wheezing. Negative for chest tightness.   Cardiovascular: Negative.  Negative for palpitations and leg swelling.  Gastrointestinal: Negative.  Negative for nausea and vomiting.  Endocrine: Negative.   Genitourinary: Negative.  Negative for dysuria.  Musculoskeletal: Negative.  Negative for joint swelling.  Skin: Negative.  Negative for rash.  Allergic/Immunologic: Positive for environmental allergies.  Neurological: Negative.  Negative for headaches.  Hematological: Bruises/bleeds easily.  Psychiatric/Behavioral: Negative.  Negative for dysphoric mood. The patient is not nervous/anxious.     Past Medical History:  Diagnosis Date  . Allergy   . Anemia   . Arthritis   . Asthma   . Chronic bronchitis ([place])   . Connective tissue disease ([place])   . Diabetes mellitus   . Endometriosis   . Fibroid uterus   . GERD (gastroesophageal reflux disease)   . History of Doppler ultrasound    a. Carotid US 8/08:Estimated stenosis in the right and left internal carotid arteries is 0-50% and 0-50%.    . History of echocardiogram    a. Echo 6/16: EF 60-65%, no RWMA.  . History of hiatal hernia   . History of nuclear stress test    a. Myoview 6/16: There is no resting or stress perfusion defect consistent with no prior infarct and no ischemia. EF 73 %  The patient was hypertensive prior and during the study. Cardiac cath 04/2016 showed normal coronary arteries  . Hx of iron deficiency anemia   . Hypersomnia with sleep apnea   . Hypertension   . 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 Menorrhagia   . Obesity   . Pneumonia   . 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 Secondary hyperthyroidism   . Thyroid disease   . Tinnitus    left ear  . Tubular adenoma of colon   . Vertigo   . Vitamin D deficiency      Family History  Problem Relation Age of Onset  . Hypertension Mother   . Diabetes Mother   . Arthritis Mother   . Depression Mother   . CAD Mother        s/p PCI  . Diabetes Father   . Stroke Father   . Arthritis Father   . Depression Father   . Hypertension Father   . Cancer Father        prostate  .  Hypertension Sister        x 2  . Breast cancer Sister   . Arthritis Sister   . Bleeding Disorder Sister   . Depression Sister   . Thyroid disease Sister   . Colon cancer Maternal Grandfather   . Stroke Maternal Aunt   . Heart attack Neg Hx     Works as a Engineer, production, desk job.   Social History   Social History  . Marital status: Married    Spouse name: [name]  . Number of children: [name]  . Years of education: 56   Occupational History  . 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 Smoking status: Never Smoker  . Smokeless tobacco: Never Used  . Alcohol use No  . Drug use: No  . Sexual activity: Yes    Partners: Male    Birth control/ protection: Surgical     Comment: hysterectomy   Other Topics Concern  . Not on file   Social History Narrative   Regular exercise-no   Caffeine Use-no      [facility]: Patient  lives with her husband. Works for [employer]. No bird or mold exposure. No pets currently.              Allergies  Allergen Reactions  . Peanut-Containing Drug Products Nausea And Vomiting  . Potassium Iodide Other (See Comments)    facial swelling, also eyes, and ears  . Ace Inhibitors Cough  . Codeine Nausea And Vomiting  . Doxycycline Hives  . Peanut Oil Swelling  . Amlodipine Rash  . Latex Hives and Rash  . Penicillins Hives, Itching and Rash    .Marland KitchenHas patient had a PCN reaction causing immediate rash, facial/tongue/throat swelling, SOB or lightheadedness with hypotension: No Has patient had a PCN reaction causing severe rash involving mucus membranes or skin necrosis: No Has patient had a PCN reaction that required hospitalization No Has patient had a PCN reaction occurring within the last 10 years: YES (3 years ago)  If all of the above answers are "NO", then may proceed with Cephalosporin use.      Outpatient Medications Prior to Visit  Medication Sig Dispense Refill  . acetaminophen (TYLENOL) 500 MG tablet Take 500 mg by mouth every 6 (six) hours as needed for headache.    [name] ambrisentan (LETAIRIS) 5 MG tablet Take 5 mg by mouth daily.    [name] aspirin EC 81 MG tablet Take 1 tablet (81 mg total) by mouth daily.    . ergocalciferol (VITAMIN D2) 50000 units capsule Take 1 capsule (50,000 Units total) by mouth once a week. 4 capsule 3  . furosemide (LASIX) 40 MG tablet Take 1 tablet (40 mg total) by mouth daily. 30 tablet 11  . Multiple Vitamin (MULTIVITAMIN WITH MINERALS) TABS tablet Take 1 tablet by mouth daily.    . mycophenolate (CELLCEPT) 500 MG tablet     . nitroGLYCERIN (NITROSTAT) 0.4 MG SL tablet Place 0.4 mg under the tongue every 5 (five) minutes as needed for chest pain (3 DOSES MAX).    [name] pantoprazole (PROTONIX) 40 MG tablet Take 1 tablet (40 mg total) by mouth daily. 30 tablet 3  . potassium chloride (K-DUR) 10 MEQ tablet Take 1 tablet (10 mEq total) by  mouth 2 (two) times daily. 60 tablet 5  . triamcinolone ointment (KENALOG) 0.1 % APPLY AS DIRECTED TO ITCHING SPOTS ON THE BODY DAILY TO TWICE DAILY AS NEEDED. NOT TO FACE.  No facility-administered medications prior to visit.         Objective:   Physical Exam Vitals:   03/08/17 1051  BP: 128/72  Pulse: 67  SpO2: 98%  Weight: 192 lb (87.1 kg)  Height: 4' 11.5" (1.511 m)   Gen: Pleasant, overwt woman, in no distress,  normal affect  ENT: No lesions,  mouth clear,  oropharynx clear, no postnasal drip  Neck: No JVD, no TMG, no carotid bruits  Lungs: No use of accessory muscles, clear without rales or rhonchi  Cardiovascular: RRR, S3+, no M  Musculoskeletal: No deformities, no cyanosis or clubbing  Neuro: alert, non focal  Skin: Warm, hyperpigmentation and some skin thickening on bilateral lower extremities. Also some hyperpigmentation on the chest and back      Assessment & Plan:  Pulmonary hypertension ([place]) Currently managed with Letairis 5 mg. She did not tolerate Adcirca due to severe side effects. We will try sildenifil to see if better tolerated.   Please continue your Myrtis Hopping as you are taking it.  We will not restart Adcirca at this time We will initiate the approval process to start Revatio to see if you tolerate it better.  We will plan to follow a repeat echocardiogram sometime at the end of the year to assess the effects of your PAH medications.  Blood work today Follow with Sabrina Lamonte Sakai in 3 months or sooner if you have any problems.  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 Now following at [facility]. On Plaquenil plus CellCept  Baltazar Apo, MD, PhD 03/08/2017, 11:24 AM [facility] [phone] or if no answer [phone]

## 2017-03-08 NOTE — Assessment & Plan Note (Signed)
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 Now following at [facility] On Plaquenil plus CellCept

## 2017-03-09 ENCOUNTER — Other Ambulatory Visit: Payer: Self-pay | Admitting: Family Medicine

## 2017-03-12 LAB — CYTOLOGY - PAP
Diagnosis: NEGATIVE
HPV (WINDOPATH): NOT DETECTED

## 2017-03-13 ENCOUNTER — Ambulatory Visit (HOSPITAL_COMMUNITY)
Admission: RE | Admit: 2017-03-13 | Discharge: 2017-03-13 | Disposition: A | Discharge status: Home / Self Care | Payer: 59 | Source: Ambulatory Visit | Attending: "Endocrinology | Admitting: "Endocrinology

## 2017-03-13 DIAGNOSIS — E039 Hypothyroidism, unspecified: Secondary | ICD-10-CM | POA: Insufficient documentation

## 2017-03-21 ENCOUNTER — Telehealth: Payer: Self-pay | Admitting: Emergency Medicine

## 2017-03-21 NOTE — Telephone Encounter (Signed)
Completed PA form was in RB's cubby in 'pending PA's folder' and has already been faxed. There are no new faxes in RB's look-at indicating an approval for pt's med. Will speak with Ria Comment on 03/22/17 to see if she has an update.

## 2017-03-22 NOTE — Telephone Encounter (Signed)
I do not have any updates as of today.

## 2017-03-22 NOTE — Telephone Encounter (Signed)
LL please advise on updates as you have them regarding pt's PA.  Thanks!

## 2017-03-27 NOTE — Telephone Encounter (Signed)
Received a fax from [employer].  Revatio has been approved.

## 2017-04-08 ENCOUNTER — Other Ambulatory Visit: Payer: Self-pay | Admitting: Emergency Medicine

## 2017-04-25 ENCOUNTER — Telehealth: Payer: Self-pay | Admitting: Family Medicine

## 2017-04-25 NOTE — Telephone Encounter (Signed)
Called patient regarding message below. No answer, left generic message for patient to return call.   

## 2017-04-25 NOTE — Telephone Encounter (Signed)
Patient calling in reference to the L eye that has been swollen for 2 days. She woke up Wednesdayswith eye "crusty", oozing "white stuff" and she is sensitive to light. She also is complaining of a headache behind the eyes.   She has not experienced this before. She does not wear contacts or glasses and does not have optometrist or ophthalmologist. Please advise as to what she can do.  First appt is Monday.  She feels she is unable to wait that long.  Cb  (she is at work)  [phone]

## 2017-04-25 NOTE — Telephone Encounter (Signed)
pls advise either call 'my eye Dr and see if they will see her , will not need a referral, she has private ins,or she go to nearest urgent care. Let her know I tried to call and her line was busy

## 2017-05-15 ENCOUNTER — Telehealth: Payer: Self-pay | Admitting: Family Medicine

## 2017-05-15 NOTE — Telephone Encounter (Signed)
Please call patient at work.  She has appt this Monday Oct 8th.  She wants to know  If she needs blood work or not.

## 2017-05-15 NOTE — Telephone Encounter (Signed)
Patient called in to ask if she needed labs before her appt on Monday 05/20/17. Tried to call back , no answer, left msg. Cb#: [phone]

## 2017-05-16 NOTE — Telephone Encounter (Signed)
Already has spoken to patient there are no active labs ordered

## 2017-05-16 NOTE — Telephone Encounter (Signed)
Called patient & informed that I did check & no active lab orders have been placed

## 2017-05-20 ENCOUNTER — Encounter: Payer: Self-pay | Admitting: Family Medicine

## 2017-05-20 ENCOUNTER — Ambulatory Visit (INDEPENDENT_AMBULATORY_CARE_PROVIDER_SITE_OTHER): Payer: 59 | Admitting: Family Medicine

## 2017-05-20 VITALS — BP 110/62 | HR 81 | Temp 98.5°F | Resp 16 | Ht 60.0 in | Wt 196.8 lb

## 2017-05-20 DIAGNOSIS — E039 Hypothyroidism, unspecified: Secondary | ICD-10-CM

## 2017-05-20 DIAGNOSIS — D126 Benign neoplasm of colon, unspecified: Secondary | ICD-10-CM | POA: Diagnosis not present

## 2017-05-20 DIAGNOSIS — E038 Other specified hypothyroidism: Secondary | ICD-10-CM

## 2017-05-20 DIAGNOSIS — F5101 Primary insomnia: Secondary | ICD-10-CM | POA: Diagnosis not present

## 2017-05-20 DIAGNOSIS — E559 Vitamin D deficiency, unspecified: Secondary | ICD-10-CM | POA: Diagnosis not present

## 2017-05-20 DIAGNOSIS — Z23 Encounter for immunization: Secondary | ICD-10-CM

## 2017-05-20 DIAGNOSIS — G4700 Insomnia, unspecified: Secondary | ICD-10-CM | POA: Insufficient documentation

## 2017-05-20 MED ORDER — TEMAZEPAM 15 MG PO CAPS: 15.0000 mg | ORAL_CAPSULE | Freq: Every evening | ORAL | 5 refills | Status: DC | PRN

## 2017-05-20 MED ORDER — ERGOCALCIFEROL 1.25 MG (50000 UT) PO CAPS: 50000.0000 [IU] | ORAL_CAPSULE | ORAL | 1 refills | Status: DC

## 2017-05-20 NOTE — Assessment & Plan Note (Signed)
Med refilled Updated lab needed at/ before next visit.

## 2017-05-20 NOTE — Assessment & Plan Note (Signed)
Needs to f/u with endo

## 2017-05-20 NOTE — Assessment & Plan Note (Signed)
Sleep hygiene reviewed and written information offered also. Prescription sent for  medication needed.  

## 2017-05-20 NOTE — Patient Instructions (Addendum)
F/u in 6 months, call if you need me before  New for sleep is restoril one at bedtime, make sure no light or soumd when you go to sleep please  Flu vaccine today  Fasting lipid and chem 7 in 5.5 months and CBC and vit D   Please reschedule appt with Dr Dorris Fetch  It is important that you exercise regularly at least 30 minutes 5 times a week. If you develop chest pain, have severe difficulty breathing, or feel very tired, stop exercising immediately and seek medical attention   Weight loss goal of 6 to 10 pounds  Thank you  for choosing [facility]. We consider it a privelige to serve you.  Delivering excellent health care in a caring and  compassionate way is our goal.  Partnering with you,  so that together we can achieve this goal is our strategy.

## 2017-05-20 NOTE — Progress Notes (Signed)
   Sabrina [name]     MRN: [id number]      DOB: 08/12/1961   HPI [name] is here for follow up and re-evaluation of chronic medical conditions, medication management and review of any available recent lab and radiology data.  Preventive health is updated, specifically  Cancer screening and Immunization.   Questions or concerns regarding consultations or procedures which the PT has had in the interim are  Addressed.Doing well with breathing and artily well with skin The PT denies any adverse reactions to current medications since the last visit.  C/o difficulty staying asleep and requests medication for this, denies any new stress  ROS Denies recent fever or chills. Denies sinus pressure, nasal congestion, ear pain or sore throat. Denies chest congestion, productive cough or wheezing. Denies chest pains, palpitations and leg swelling Denies abdominal pain, nausea, vomiting,diarrhea or constipation.   Denies dysuria, frequency, hesitancy or incontinence. Denies joint pain, swelling and limitation in mobility. Denies headaches, seizures, numbness, or tingling. Denies depression, anxiety    PE  BP 110/62 (BP Location: Left Arm, Patient Position: Sitting, Cuff Size: Normal)   Pulse 81   Temp 98.5 F (36.9 C) (Other (Comment))   Resp 16   Ht 5' (1.524 m)   Wt 196 lb 12 oz (89.2 kg)   LMP 10/31/2002   SpO2 99%   BMI 38.43 kg/m   Patient alert and oriented and in no cardiopulmonary distress.  HEENT: No facial asymmetry, EOMI,   oropharynx pink and moist.  Neck supple no JVD, no mass.  Chest: Clear to auscultation bilaterally.  CVS: S1, S2 no murmurs, no S3.Regular rate.  ABD: Soft non tender.   Ext: No edema  MS: Adequate ROM spine, shoulders, hips and knees.  Skin: I rash noted.  Psych: Good eye contact, normal affect. Memory intact not anxious or depressed appearing.  CNS: CN 2-12 intact, power,  normal throughout.no focal deficits noted.   Assessment &  Plan  Insomnia Sleep hygiene reviewed and written information offered also. Prescription sent for  medication needed.   Vitamin D deficiency Med refilled Updated lab needed at/ before next visit.   Hypocalcemia Needs to f/u with endo  Tubular adenoma of colon Has rept colonoscopy due  Subclinical hypothyroidism Needs to f/u with endo  Morbid obesity ([place]) Deteriorated. Patient re-educated about  the importance of commitment to a  minimum of 150 minutes of exercise per week.  The importance of healthy food choices with portion control discussed. Encouraged to start a food diary, count calories and to consider  joining a support group. Sample diet sheets offered. Goals set by the patient for the next several months.   Weight /BMI 05/20/2017 03/08/2017 03/08/2017  WEIGHT 196 lb 12 oz 193 lb 192 lb  HEIGHT 5\' 0"  4' 11.5" 4' 11.5"  BMI 38.43 kg/m2 38.33 kg/m2 38.13 kg/m2

## 2017-05-20 NOTE — Assessment & Plan Note (Signed)
Deteriorated. Patient re-educated about  the importance of commitment to a  minimum of 150 minutes of exercise per week.  The importance of healthy food choices with portion control discussed. Encouraged to start a food diary, count calories and to consider  joining a support group. Sample diet sheets offered. Goals set by the patient for the next several months.   Weight /BMI 05/20/2017 03/08/2017 03/08/2017  WEIGHT 196 lb 12 oz 193 lb 192 lb  HEIGHT 5\' 0"  4' 11.5" 4' 11.5"  BMI 38.43 kg/m2 38.33 kg/m2 38.13 kg/m2

## 2017-05-20 NOTE — Assessment & Plan Note (Signed)
Has rept colonoscopy due

## 2017-06-07 ENCOUNTER — Ambulatory Visit: Payer: 59 | Admitting: Emergency Medicine

## 2017-06-14 ENCOUNTER — Ambulatory Visit (INDEPENDENT_AMBULATORY_CARE_PROVIDER_SITE_OTHER): Payer: 59 | Admitting: Emergency Medicine

## 2017-06-14 ENCOUNTER — Encounter: Payer: Self-pay | Admitting: Emergency Medicine

## 2017-06-14 VITALS — BP 126/80 | HR 80 | Ht 60.0 in | Wt 196.0 lb

## 2017-06-14 DIAGNOSIS — I272 Pulmonary hypertension, unspecified: Secondary | ICD-10-CM | POA: Diagnosis not present

## 2017-06-14 NOTE — Patient Instructions (Signed)
Continue all of your current medications, except for Sildenafil. Please repeat your Echocardiogram at [facility] in February 2019. Follow up with Dr. Lamonte Sakai in February with a 6 Minute Walk Test.

## 2017-06-14 NOTE — Assessment & Plan Note (Signed)
Pulmonary hypertension in the setting of connective tissue disease.  Currently on anti-inflammatory medications as managed by rheumatology.  She has not been able to tolerate either Adcirca or sildenafil.  We will continue her Leteris and stay off of sildenafil.  Plan to repeat her 6-minute walk and her echocardiogram in February 2019.

## 2017-06-14 NOTE — Progress Notes (Signed)
Subjective:    Patient ID: Sabrina [name], female    DOB: 1960/08/24, 56 y.o.   MRN: [id number]  HPI Sabrina [name] is a 56 year old never smoker with a history of diastolic CHF, systemic hypertension, diabetes, obesity with ?? obstructive sleep apnea. She had a sleep study at [facility] before her gastric sgy. With a history of possible Lupus followed at [facility] and locally by Dr Tonia Brooms. She was seen by Rheum and it sounds like she was serology negative. Underwent gastric sleeve for wt loss (2016) by Dr Redmond Pulling, well tolerated. She took Phen-Fen for about 6 months, about 2 years. Former MTX use  She has been under evaluation for her CHF and is followed at [facility] heart care. She has been on lasix for the last month or so, feels that her breathing is better.  She underwent left and right cardiac catheterization 03/22/16 as detailed below, shows significant elevation PA pressures. No MV or TV disease noted.   ROV 06/22/16 -- This follow-up visit for evaluation of severe pulmonary hypertension. She has a history as outlined above that includes diastolic CHF, suspected obstructive sleep apnea, possible connective tissue disease, and diet pill use. We have performed testing since last visit. She has had a low probability ventilation/perfusion scan 06/04/16, and normal chest x-ray. She has a positive ANA from 05/25/16 with a titer of 1:1280, centromeric pattern. Rheumatoid factor elevated at 36. Home sleep study is pending. Walking oximetry last time was normal on RA.   She has been having dry cough, may be due to increased allergy sx. She does not feel exertional SOB, is able to exert. She takes stairs slower but is able to climb. She has seen Dr Lavonna Monarch and was just started on hydroxychloriquine this week, for skin symptoms.    ROV 07/27/16 -- This is a follow-up visit for severe pulmonary hypertension in the setting of diastolic CHF, newly identified connective tissue disease. She also has a history of  diet pill use in the remote past. No evidence to support chronic thromboembolic disease. Since our last visit she has been started on [place]. 6 minute walk was performed today. She was able to ambulate 291 m without any evidence of desaturation.  She had significant side effects when she started both > diarrhea, severe HA. She stopped both on 12/12. She is now on Plaquenil, blood work to be followed by Dr Denna Haggard. She is about to start timolol for prolonged QT-c per cardiology plans.   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 She is on Adcirca 20mg  and Letaris 5mg , now on for about 2.5 months. She still has HA after the Adcirca. She has noticed a significant improvement in her breathing - able to exert much better. Her AHI on home sleep study was 8.8 / hour.   ROV 03/08/17 -- patient follows up today for her history of severe pulmonary hypertension in the setting of connective tissue disease, diastolic CHF, prior weight loss drug use. Also with a history of diastolic dysfunction. Since last time she had to stop Adcirca due to diarrhea and HA. She remains on Letaris. She is seeing Rheum at [facility] now, remaisn on Plaquanil. Mycophenolate added in June - has helped with skin thickness in her LE's. Her TTE from 2/18 was done on both PAH meds. She hasn't noticed that stopping the Adcirca has bothered her breathing. No significant swelling, no CP, no syncope or pre-syncope. We deferred  CPAP therapy  ROV 06/14/17 -- Sabrina [name] has connective tissue disease followed by rheum at [facility], on plaquanil, cellcept. Also mild OSA. She is on letaris. She could not tolerate Adcirca, so we tried to start sildenifil > she has had diarrhea, HA, cannot take it. Currently on letaris and tolerating. She denies any new issues. Stable wt. No rash. No CP.     [facility] 03/22/16  The left ventricular systolic function is normal.  The left ventricular  ejection fraction is 55-65% by visual estimate.  Hemodynamic findings consistent with severe pulmonary hypertension.  PASP 114 mmHg Severe right ventricular systolic pressure and pulmonary hypertension with systolic pressures greater than 100 mmHg. Significant pulmonary capillary/LV gradient, without previous evidence of mitral stenosis on echocardiography. Normal coronary arteries. Normal LV systolic function with left ventricular hypertrophy. RECOMMENDATION: The patient will undergo 2-D echo Doppler study later today prior to being discharged. The etiology of her severe pulmonary hypertension is unknown and consider possible primary pulmonary hypertension. Echo Doppler study will be helpful to further assess her mitral valve and potential for RV volume/pressure overload  TTE 09/14/16 >> good LV function, mildly dilated right ventricle, peak PA pressure 36 mm Hg  Review of Systems  Constitutional: Negative.  Negative for fever and unexpected weight change.  HENT: Positive for sinus pressure and sneezing. Negative for congestion, dental problem, ear pain, nosebleeds, postnasal drip, rhinorrhea, sore throat and trouble swallowing.   Eyes: Positive for itching. Negative for redness.  Respiratory: Positive for cough, shortness of breath and wheezing. Negative for chest tightness.   Cardiovascular: Negative.  Negative for palpitations and leg swelling.  Gastrointestinal: Negative.  Negative for nausea and vomiting.  Endocrine: Negative.   Genitourinary: Negative.  Negative for dysuria.  Musculoskeletal: Negative.  Negative for joint swelling.  Skin: Negative.  Negative for rash.  Allergic/Immunologic: Positive for environmental allergies.  Neurological: Negative.  Negative for headaches.  Hematological: Bruises/bleeds easily.  Psychiatric/Behavioral: Negative.  Negative for dysphoric mood. The patient is not nervous/anxious.     Past Medical History:  Diagnosis Date  . Allergy   .  Anemia   . Arthritis   . Asthma   . Chronic bronchitis ([place])   . Connective tissue disease ([place])   . Diabetes mellitus   . Endometriosis   . Fibroid uterus   . GERD (gastroesophageal reflux disease)   . History of Doppler ultrasound    a. Carotid US 8/08:Estimated stenosis in the right and left internal carotid arteries is 0-50% and 0-50%.  . History of echocardiogram    a. Echo 6/16: EF 60-65%, no RWMA.  . History of hiatal hernia   . History of nuclear stress test    a. Myoview 6/16: There is no resting or stress perfusion defect consistent with no prior infarct and no ischemia. EF 73 %  The patient was hypertensive prior and during the study. Cardiac cath 04/2016 showed normal coronary arteries  . Hx of iron deficiency anemia   . Hypersomnia with sleep apnea   . Hypertension   . 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 Menorrhagia   . Obesity   . Pneumonia   . 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 Secondary hyperthyroidism   . Thyroid disease   . Tinnitus    left ear  . Tubular adenoma of colon   . Vertigo   . Vitamin D deficiency      Family History  Problem Relation Age of Onset  . Hypertension Mother   . Diabetes  Mother   . Arthritis Mother   . Depression Mother   . CAD Mother        s/p PCI  . Diabetes Father   . Stroke Father   . Arthritis Father   . Depression Father   . Hypertension Father   . Cancer Father        prostate  . Hypertension Sister        x 2  . Breast cancer Sister   . Arthritis Sister   . Bleeding Disorder Sister   . Depression Sister   . Thyroid disease Sister   . Colon cancer Maternal Grandfather   . Stroke Maternal Aunt   . Heart attack Neg Hx     Works as a Engineer, production, desk job.   Social History   Social History  . Marital status: Married    Spouse name: [name]  . Number of children: [name]  . Years of education: 1   Occupational History  . 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 Smoking  status: Never Smoker  . Smokeless tobacco: Never Used  . Alcohol use No  . Drug use: No  . Sexual activity: Yes    Partners: Male    Birth control/ protection: Surgical     Comment: hysterectomy   Other Topics Concern  . Not on file   Social History Narrative   Regular exercise-no   Caffeine Use-no      [facility]: Patient lives with her husband. Works for [employer]. No bird or mold exposure. No pets currently.              Allergies  Allergen Reactions  . Peanut-Containing Drug Products Nausea And Vomiting  . Potassium Iodide Other (See Comments)    facial swelling, also eyes, and ears  . Ace Inhibitors Cough  . Codeine Nausea And Vomiting  . Doxycycline Hives  . Peanut Oil Swelling  . Amlodipine Rash  . Latex Hives and Rash  . Penicillins Hives, Itching and Rash    .Marland KitchenHas patient had a PCN reaction causing immediate rash, facial/tongue/throat swelling, SOB or lightheadedness with hypotension: No Has patient had a PCN reaction causing severe rash involving mucus membranes or skin necrosis: No Has patient had a PCN reaction that required hospitalization No Has patient had a PCN reaction occurring within the last 10 years: YES (3 years ago)  If all of the above answers are "NO", then may proceed with Cephalosporin use.      Outpatient Medications Prior to Visit  Medication Sig Dispense Refill  . acetaminophen (TYLENOL) 500 MG tablet Take 500 mg by mouth every 6 (six) hours as needed for headache.    [name] aspirin EC 81 MG tablet Take 1 tablet (81 mg total) by mouth daily.    . ergocalciferol (VITAMIN D2) 50000 units capsule Take 1 capsule (50,000 Units total) by mouth once a week. One capsule once weekly 12 capsule 1  . furosemide (LASIX) 40 MG tablet Take 1 tablet (40 mg total) by mouth daily. 30 tablet 11  . LETAIRIS 5 MG tablet TAKE 1 TABLET BY MOUTH EVERY DAY 30 tablet 5  . Multiple Vitamin (MULTIVITAMIN WITH MINERALS) TABS tablet Take 1 tablet by  mouth daily.    . mycophenolate (CELLCEPT) 500 MG tablet     . nitroGLYCERIN (NITROSTAT) 0.4 MG SL tablet Place 0.4 mg under the tongue every 5 (five) minutes as needed for chest pain (3 DOSES MAX).    [name]  pantoprazole (PROTONIX) 40 MG tablet Take 1 tablet (40 mg total) by mouth daily. 30 tablet 3  . potassium chloride (K-DUR) 10 MEQ tablet Take 1 tablet (10 mEq total) by mouth 2 (two) times daily. 60 tablet 5  . temazepam (RESTORIL) 15 MG capsule Take 1 capsule (15 mg total) by mouth at bedtime as needed for sleep. 30 capsule 5  . triamcinolone ointment (KENALOG) 0.1 % APPLY AS DIRECTED TO ITCHING SPOTS ON THE BODY DAILY TO TWICE DAILY AS NEEDED. NOT TO FACE.    . sildenafil (REVATIO) 20 MG tablet Take 1 tablet (20 mg total) by mouth 3 (three) times daily. 90 tablet 5   No facility-administered medications prior to visit.         Objective:   Physical Exam Vitals:   06/14/17 0957  BP: 126/80  Pulse: 80  SpO2: 96%  Weight: 196 lb (88.9 kg)  Height: 5' (1.524 m)   Gen: Pleasant, overwt woman, in no distress,  normal affect  ENT: No lesions,  mouth clear,  oropharynx clear, no postnasal drip  Neck: No JVD, no TMG, no carotid bruits  Lungs: No use of accessory muscles, clear without rales or rhonchi  Cardiovascular: RRR, S3+, no M  Musculoskeletal: No deformities, no cyanosis or clubbing  Neuro: alert, non focal  Skin: Warm, hyperpigmentation and some skin thickening on bilateral lower extremities. Also some hyperpigmentation on the chest and back      Assessment & Plan:  Pulmonary hypertension ([place]) Pulmonary hypertension in the setting of connective tissue disease.  Currently on anti-inflammatory medications as managed by rheumatology.  She has not been able to tolerate either Adcirca or sildenafil.  We will continue her Leteris and stay off of sildenafil.  Plan to repeat her 6-minute walk and her echocardiogram in February 2019.  Baltazar Apo, MD, PhD 06/14/2017, 1:36  PM [facility] [phone] or if no answer [phone]

## 2017-06-24 ENCOUNTER — Ambulatory Visit: Payer: 59 | Admitting: Family Medicine

## 2017-06-24 ENCOUNTER — Telehealth: Payer: Self-pay | Admitting: *Deleted

## 2017-06-24 ENCOUNTER — Encounter: Payer: Self-pay | Admitting: Family Medicine

## 2017-06-24 VITALS — BP 112/62 | HR 74 | Resp 16 | Ht 60.0 in | Wt 198.4 lb

## 2017-06-24 DIAGNOSIS — I5032 Chronic diastolic (congestive) heart failure: Secondary | ICD-10-CM | POA: Diagnosis not present

## 2017-06-24 DIAGNOSIS — E8881 Metabolic syndrome: Secondary | ICD-10-CM | POA: Diagnosis not present

## 2017-06-24 DIAGNOSIS — E038 Other specified hypothyroidism: Secondary | ICD-10-CM

## 2017-06-24 DIAGNOSIS — E039 Hypothyroidism, unspecified: Secondary | ICD-10-CM | POA: Diagnosis not present

## 2017-06-24 DIAGNOSIS — K219 Gastro-esophageal reflux disease without esophagitis: Secondary | ICD-10-CM

## 2017-06-24 DIAGNOSIS — M791 Myalgia, unspecified site: Secondary | ICD-10-CM | POA: Diagnosis not present

## 2017-06-24 DIAGNOSIS — M109 Gout, unspecified: Secondary | ICD-10-CM | POA: Diagnosis not present

## 2017-06-24 DIAGNOSIS — E559 Vitamin D deficiency, unspecified: Secondary | ICD-10-CM

## 2017-06-24 DIAGNOSIS — D649 Anemia, unspecified: Secondary | ICD-10-CM | POA: Diagnosis not present

## 2017-06-24 DIAGNOSIS — I272 Pulmonary hypertension, unspecified: Secondary | ICD-10-CM | POA: Diagnosis not present

## 2017-06-24 MED ORDER — INDOMETHACIN 25 MG PO CAPS: ORAL_CAPSULE | ORAL | 0 refills | Status: DC

## 2017-06-24 MED ORDER — PREDNISONE 5 MG (21) PO TBPK: 5.0000 mg | ORAL_TABLET | ORAL | 0 refills | Status: DC

## 2017-06-24 NOTE — Progress Notes (Signed)
   Sabrina [name]     MRN: [id number]      DOB: [birth year]   HPI [name] is here with a 3 day h/o acute pain and swelling of her left hand no trauma , has not had this problem before No known h/o joint disease bu does have automimmune disease  ROS Denies recent fever or chills. Denies sinus pressure, nasal congestion, ear pain or sore throat. Denies chest congestion, productive cough or wheezing. Denies chest pains, palpitations and leg swelling Denies abdominal pain, nausea, vomiting,diarrhea or constipation.   Denies dysuria, frequency, hesitancy or incontinence.  Denies headaches, seizures, numbness, or tingling. Denies depression, anxiety unable to sleep x 3 days due to pain Denies skin break down or rash.   PE  BP 112/62   Pulse 74   Resp 16   Ht 5' (1.524 m)   Wt 198 lb 6.4 oz (90 kg)   LMP 10/31/2002   SpO2 98%   BMI 38.75 kg/m   Patient alert and oriented and in no cardiopulmonary distress.Pt in pain  HEENT: No facial asymmetry, EOMI,   oropharynx pink and moist.  Neck supple no JVD, no mass.  Chest: Clear to auscultation bilaterally.  CVS: S1, S2 no murmurs, no S3.Regular rate.  ABD: Soft non tender.   Ext: No edema  MS: Adequate ROM spine, shoulders, hips and knees. Left hands and digits swollen, warm and tender with reduced ROM Skin: Intact, no ulcerations or rash noted.Erythema of left hand Psych: Good eye contact, normal affect. Memory intact not anxious or depressed appearing.  CNS: CN 2-12 intact, power,  normal throughout.no focal deficits noted.   Assessment & Plan  Acute gout of left hand First episode of acute pain and swelling of hand, unknown etiology, presumed gout. Uric acid and ESR to be  Checked Short course of indomethacin and prednisone prescribed, pt to call in 2 days if not much improved. May be associated with her underlying autoimmune disease  Chronic diastolic heart failure (HCC) No decompensation currently,  stable  Pulmonary hypertension ([place]) followed by pulmonary and improved on current regime  GERD (gastroesophageal reflux disease) Controlled, no change in medication

## 2017-06-24 NOTE — Patient Instructions (Signed)
F/u as before, clal if you need me sooner  PLEASE get in touch in next 2 days if not much better  Labs today, cbc, chem and EGFR and ESR and uric acid level  You are treated for acute gout of left hand prednisone and indomethacin are sent to your pharmacy  Thank you  for choosing [facility]. We consider it a privelige to serve you.  Delivering excellent health care in a caring and  compassionate way is our goal.  Partnering with you,  so that together we can achieve this goal is our strategy.

## 2017-06-24 NOTE — Telephone Encounter (Signed)
Do you want to sch, or order lab and /or send something for her?

## 2017-06-24 NOTE — Telephone Encounter (Signed)
Patient is scheduled for today at 3:15. Patient is aware.

## 2017-06-24 NOTE — Telephone Encounter (Signed)
Sabrina [name] calling to tell her to come on now

## 2017-06-24 NOTE — Telephone Encounter (Signed)
Patient called stating her left hand is really swollen and very sore, patient states she has gout in it. Patient stated she has never had gout before. Please advise [phone]

## 2017-06-24 NOTE — Telephone Encounter (Signed)
New problem with no dx needs a clinical eval, may be worked in with soonest available Provider in the office if I have no availabaility If none in office then urgent care

## 2017-06-25 ENCOUNTER — Encounter: Payer: Self-pay | Admitting: Family Medicine

## 2017-06-28 LAB — BASIC METABOLIC PANEL WITH GFR
BUN/Creatinine Ratio: 18 (calc) (ref 6–22)
BUN: 20 mg/dL (ref 7–25)
CALCIUM: 7.3 mg/dL — AB (ref 8.6–10.4)
CHLORIDE: 101 mmol/L (ref 98–110)
CO2: 32 mmol/L (ref 20–32)
CREATININE: 1.14 mg/dL — AB (ref 0.50–1.05)
GFR, EST AFRICAN AMERICAN: 64 mL/min/{1.73_m2} (ref >=60)
GFR, Est Non African American: 55 mL/min/{1.73_m2} — ABNORMAL LOW (ref >=60)
Glucose, Bld: 87 mg/dL (ref 65–139)
Potassium: 3.4 mmol/L — ABNORMAL LOW (ref 3.5–5.3)
Sodium: 142 mmol/L (ref 135–146)

## 2017-06-28 LAB — IRON, TOTAL/TOTAL IRON BINDING CAP
%SAT: 8 % — AB (ref 11–50)
Iron: 24 ug/dL — ABNORMAL LOW (ref 45–160)
TIBC: 309 mcg/dL (calc) (ref 250–450)

## 2017-06-28 LAB — CBC
HCT: 32.6 % — ABNORMAL LOW (ref 35.0–45.0)
HEMOGLOBIN: 10.6 g/dL — AB (ref 11.7–15.5)
MCH: 25.9 pg — ABNORMAL LOW (ref 27.0–33.0)
MCHC: 32.5 g/dL (ref 32.0–36.0)
MCV: 79.5 fL — ABNORMAL LOW (ref 80.0–100.0)
MPV: 11.6 fL (ref 7.5–12.5)
PLATELETS: 294 10*3/uL (ref 140–400)
RBC: 4.1 10*6/uL (ref 3.80–5.10)
RDW: 13.6 % (ref 11.0–15.0)
WBC: 11.4 10*3/uL — AB (ref 3.8–10.8)

## 2017-06-28 LAB — SEDIMENTATION RATE: Sed Rate: 58 mm/h — ABNORMAL HIGH (ref 0–30)

## 2017-06-28 LAB — FERRITIN: FERRITIN: 53 ng/mL (ref 10–232)

## 2017-06-28 LAB — FOLATE: Folate: 2.8 ng/mL — ABNORMAL LOW

## 2017-06-28 LAB — VITAMIN B12: Vitamin B-12: 329 pg/mL (ref 200–1100)

## 2017-06-28 LAB — URIC ACID: Uric Acid, Serum: 4.4 mg/dL (ref 2.5–7.0)

## 2017-07-06 DIAGNOSIS — M109 Gout, unspecified: Secondary | ICD-10-CM | POA: Insufficient documentation

## 2017-07-06 NOTE — Assessment & Plan Note (Signed)
Controlled, no change in medication  

## 2017-07-06 NOTE — Assessment & Plan Note (Signed)
No decompensation currently , stable 

## 2017-07-06 NOTE — Assessment & Plan Note (Signed)
First episode of acute pain and swelling of hand, unknown etiology, presumed gout. Uric acid and ESR to be  Checked Short course of indomethacin and prednisone prescribed, pt to call in 2 days if not much improved. May be associated with her underlying autoimmune disease

## 2017-07-06 NOTE — Assessment & Plan Note (Signed)
followed by pulmonary and improved on current regime

## 2017-07-12 DIAGNOSIS — D509 Iron deficiency anemia, unspecified: Secondary | ICD-10-CM

## 2017-07-25 ENCOUNTER — Telehealth: Payer: Self-pay | Admitting: Emergency Medicine

## 2017-07-25 NOTE — Telephone Encounter (Signed)
PA completed and approved until 07/25/2018  PA #   [id number].  Called and spoke with [place] and they are aware that the PA has been approved and they will get this sent out to the pt

## 2017-08-19 ENCOUNTER — Telehealth: Payer: Self-pay | Admitting: Family Medicine

## 2017-08-19 NOTE — Telephone Encounter (Signed)
Patient is requesting a call to [phone] - she is leaving work to go home because she doesn't feel well.

## 2017-08-19 NOTE — Telephone Encounter (Signed)
Patient is requesting prednisone and tessalon pearls for reoccuring cough and stuffiness [facility] [place]  Cb# [phone]

## 2017-08-20 ENCOUNTER — Other Ambulatory Visit: Payer: Self-pay | Admitting: Family Medicine

## 2017-08-20 MED ORDER — PREDNISONE 5 MG (21) PO TBPK: 5.0000 mg | ORAL_TABLET | ORAL | 0 refills | Status: DC

## 2017-08-20 MED ORDER — BENZONATATE 100 MG PO CAPS: 100.0000 mg | ORAL_CAPSULE | Freq: Two times a day (BID) | ORAL | 0 refills | Status: DC | PRN

## 2017-08-20 NOTE — Telephone Encounter (Signed)
Patients husband came into the office and said nobody has called his wife back. She is having cough and some mucus x Sunday night. No fever. Wants prednisone and tessalon sent in asap. Please advise

## 2017-08-20 NOTE — Progress Notes (Signed)
Spoke with patient and she is aware medication is prescribed

## 2017-08-20 NOTE — Telephone Encounter (Signed)
Pt denies fever , chills, or green sputum, declines IN steroid, prescription sent to her pharmacy and she is aware

## 2017-08-21 ENCOUNTER — Other Ambulatory Visit: Payer: Self-pay | Admitting: Family Medicine

## 2017-08-21 DIAGNOSIS — Z1231 Encounter for screening mammogram for malignant neoplasm of breast: Secondary | ICD-10-CM

## 2017-08-22 ENCOUNTER — Ambulatory Visit (INDEPENDENT_AMBULATORY_CARE_PROVIDER_SITE_OTHER): Payer: 59

## 2017-08-22 DIAGNOSIS — R05 Cough: Secondary | ICD-10-CM

## 2017-08-22 DIAGNOSIS — R059 Cough, unspecified: Secondary | ICD-10-CM

## 2017-08-22 MED ORDER — METHYLPREDNISOLONE ACETATE 80 MG/ML IJ SUSP
80.0000 mg | Freq: Once | INTRAMUSCULAR | Status: AC
  Administered 2017-08-22: 80 mg via INTRAMUSCULAR

## 2017-08-22 NOTE — Telephone Encounter (Signed)
Coming this pm

## 2017-08-22 NOTE — Telephone Encounter (Signed)
Patient states you said she could come in for IM steroid and she will be here this am. Ok to administer?

## 2017-08-22 NOTE — Telephone Encounter (Signed)
Please give depo  Medrol 80 mg IM

## 2017-08-22 NOTE — Progress Notes (Signed)
Patient still not a lot better and still having cough and congestion. Advised by Dr Moshe Cipro to give Depo medrol 80 mg IM. Received without complications in [place]

## 2017-08-29 ENCOUNTER — Telehealth: Payer: Self-pay | Admitting: Family Medicine

## 2017-08-29 NOTE — Telephone Encounter (Signed)
Please call pt on her work number and schedule an appointment with Dr Meda Coffee when it is convenient , I have discussed this with Dr Meda Coffee, thanks, I have also spoken to patient

## 2017-08-29 NOTE — Telephone Encounter (Signed)
I have spoken to [place], and offered her dr Thera Flake 1120 for tomorrow. She states she cannot do this, she has a funeral to go to. She states she is finding another dr.

## 2017-08-29 NOTE — Telephone Encounter (Signed)
Out of the predisone, she needs some more, cough and drainage is still going on --she thinks she needs 20 mg..[phone]

## 2017-08-29 NOTE — Telephone Encounter (Signed)
I agree, patient needs a clinical evaluation, I will not be prescribing higher doses of prednisone based on a call from her only, I have not evaluated her, and I have no openings this pm She already agreed to see her pulmonary Doc if not improving, if she is unable to be seen by the pulmonary specialist in a timely fashion , she will need to go to urgent care or the ED I have made several unsuccessful attempts to speak directly with her, please let her know if you are able to contact her

## 2017-08-29 NOTE — Telephone Encounter (Signed)
.   I had advised her at her nurse visit for the depo injection that if symptoms persisted with the cough to contact pulmonary and she agreed. She is now asking for 20mg  prednisone because she still has the cough and drainage. Please advise- no fever or green mucus

## 2017-08-29 NOTE — Telephone Encounter (Signed)
noted 

## 2017-08-30 ENCOUNTER — Ambulatory Visit (HOSPITAL_COMMUNITY): Payer: 59

## 2017-08-30 ENCOUNTER — Ambulatory Visit (HOSPITAL_COMMUNITY)
Admission: RE | Admit: 2017-08-30 | Discharge: 2017-08-30 | Disposition: A | Discharge status: Home / Self Care | Payer: 59 | Source: Ambulatory Visit | Attending: Family Medicine | Admitting: Family Medicine

## 2017-08-30 ENCOUNTER — Encounter (HOSPITAL_COMMUNITY): Payer: Self-pay

## 2017-08-30 DIAGNOSIS — Z1231 Encounter for screening mammogram for malignant neoplasm of breast: Secondary | ICD-10-CM | POA: Diagnosis present

## 2017-09-06 DIAGNOSIS — I2721 Secondary pulmonary arterial hypertension: Secondary | ICD-10-CM | POA: Diagnosis not present

## 2017-09-06 DIAGNOSIS — M359 Systemic involvement of connective tissue, unspecified: Secondary | ICD-10-CM | POA: Diagnosis not present

## 2017-09-06 DIAGNOSIS — Z79899 Other long term (current) drug therapy: Secondary | ICD-10-CM | POA: Diagnosis not present

## 2017-09-10 ENCOUNTER — Ambulatory Visit (HOSPITAL_COMMUNITY)
Admission: RE | Admit: 2017-09-10 | Discharge: 2017-09-10 | Disposition: A | Discharge status: Home / Self Care | Payer: 59 | Source: Ambulatory Visit | Attending: Emergency Medicine | Admitting: Emergency Medicine

## 2017-09-10 DIAGNOSIS — Z6838 Body mass index (BMI) 38.0-38.9, adult: Secondary | ICD-10-CM | POA: Insufficient documentation

## 2017-09-10 DIAGNOSIS — I119 Hypertensive heart disease without heart failure: Secondary | ICD-10-CM | POA: Diagnosis not present

## 2017-09-10 DIAGNOSIS — K219 Gastro-esophageal reflux disease without esophagitis: Secondary | ICD-10-CM | POA: Insufficient documentation

## 2017-09-10 DIAGNOSIS — I071 Rheumatic tricuspid insufficiency: Secondary | ICD-10-CM | POA: Diagnosis not present

## 2017-09-10 DIAGNOSIS — I272 Pulmonary hypertension, unspecified: Secondary | ICD-10-CM | POA: Diagnosis not present

## 2017-09-10 DIAGNOSIS — E119 Type 2 diabetes mellitus without complications: Secondary | ICD-10-CM | POA: Insufficient documentation

## 2017-09-10 LAB — ECHOCARDIOGRAM COMPLETE
AO mean calculated velocity dopler: 127 cm/s
AV Area VTI index: 0.88 cm2/m2
AV Area mean vel: 1.38 cm2
AV Mean grad: 8 mmHg
AV Peak grad: 17 mmHg
AV area mean vel ind: 0.69 cm2/m2
AV peak Index: 0.79
AVA: 1.76 cm2
AVAREAVTI: 1.59 cm2
AVCELMEANRAT: 0.54
AVLVOTPG: 7 mmHg
AVPKVEL: 205 cm/s
Ao pk vel: 0.62 m/s
CHL CUP AV VEL: 1.76
CHL CUP MV DEC (S): 176
E decel time: 176 msec
EERAT: 11.05
FS: 37 % (ref 28–44)
IV/PV OW: 1.01
LA ID, A-P, ES: 36 mm
LA diam end sys: 36 mm
LA diam index: 1.8 cm/m2
LA vol A4C: 33.2 ml
LA vol index: 18.1 mL/m2
LA vol: 36.2 mL
LDCA: 2.54 cm2
LV E/e' medial: 11.05
LV PW d: 11 mm — AB (ref 0.6–1.1)
LV SIMPSON'S DISK: 64
LV TDI E'LATERAL: 9.14
LV dias vol index: 25 mL/m2
LV dias vol: 50 mL (ref 46–106)
LV e' LATERAL: 9.14 cm/s
LV sys vol index: 9 mL/m2
LVEEAVG: 11.05
LVOT SV: 82 mL
LVOT VTI: 32.2 cm
LVOT peak VTI: 0.69 cm
LVOTD: 18 mm
LVOTPV: 128 cm/s
LVSYSVOL: 18 mL
Lateral S' vel: 7.94 cm/s
MV pk A vel: 94.4 m/s
MVPG: 4 mmHg
MVPKEVEL: 101 m/s
RV sys press: 52 mmHg
Reg peak vel: 304 cm/s
Stroke v: 32 ml
TAPSE: 16.1 mm
TDI e' medial: 6.2
TRMAXVEL: 304 cm/s
VTI: 46.5 cm
Valve area index: 0.88

## 2017-09-10 NOTE — Progress Notes (Signed)
*  PRELIMINARY RESULTS* Echocardiogram 2D Echocardiogram has been performed.  Sabrina [name] 09/10/2017, 1:51 PM

## 2017-09-11 ENCOUNTER — Other Ambulatory Visit (HOSPITAL_COMMUNITY): Payer: 59

## 2017-09-13 ENCOUNTER — Ambulatory Visit (INDEPENDENT_AMBULATORY_CARE_PROVIDER_SITE_OTHER): Payer: 59 | Admitting: *Deleted

## 2017-09-13 ENCOUNTER — Encounter: Payer: Self-pay | Admitting: Emergency Medicine

## 2017-09-13 ENCOUNTER — Ambulatory Visit: Payer: 59 | Admitting: Emergency Medicine

## 2017-09-13 VITALS — BP 118/64 | HR 75 | Ht 60.0 in | Wt 199.0 lb

## 2017-09-13 DIAGNOSIS — M359 Systemic involvement of connective tissue, unspecified: Secondary | ICD-10-CM | POA: Diagnosis not present

## 2017-09-13 DIAGNOSIS — I272 Pulmonary hypertension, unspecified: Secondary | ICD-10-CM

## 2017-09-13 MED ORDER — FUROSEMIDE 20 MG PO TABS: 60.0000 mg | ORAL_TABLET | Freq: Every day | ORAL | 5 refills | Status: DC

## 2017-09-13 NOTE — Patient Instructions (Addendum)
We will increase your Lasix to 60 mg daily. We will refer you back to see Dr Curt Bears with cardiology to adjust diuretics further if needed.  Please continue your Plaquenil and CellCept as directed by rheumatology at [facility]. Agree with high-resolution CT scan of the chest as planned.  We will obtain copies and review this with you when available. We will repeat your pulmonary function testing Your 6-minute walk distance today was slightly improved, but your increased shortness of breath and slightly increased pulmonary arterial pressures on echocardiogram raise the question of progression of pulmonary hypertension.  We will need to follow this closely.  I recommend a repeat echocardiogram versus right heart catheterization in approximately 6 months.  If symptoms progress then we will do this earlier.  Follow with Dr Lamonte Sakai in 2 months or sooner if you have any problems.

## 2017-09-13 NOTE — Progress Notes (Signed)
SIX MIN WALK 09/13/2017 11/16/2016 07/27/2016 05/25/2016  Medications Protonix 40mg  at 8am no meds this morning Pt. did not take any medication this morning -  Supplimental Oxygen during Test? (L/min) No No No No  Laps 6 6 6  -  Partial Lap (in Meters) 60 41 3 -  Baseline BP (sitting) 110/64 124/80 108/74 -  Baseline Heartrate 81 77 98 -  Baseline Dyspnea (Borg Scale) 4 0 3 -  Baseline Fatigue (Borg Scale) 4 0 3 -  Baseline SPO2 97 99 97 -  BP (sitting) 130/80 142/90 118/70 -  Heartrate 114 119 108 -  Dyspnea (Borg Scale) 5 1 2  -  Fatigue (Borg Scale) 5 1 2  -  SPO2 93 97 92 -  BP (sitting) 124/74 138/82 112/72 -  Heartrate 85 81 84 -  SPO2 98 98 97 -  Stopped or Paused before Six Minutes No No No -  Distance Completed 348 329 291 -  Tech Comments: Pt walked at a slow pace during the walk pt walked normal pace ( per pt), no desat --amg Pt. walked at a steady slow pace, she did well on her walk Pt. walked at a steady pace, with no breaks

## 2017-09-13 NOTE — Progress Notes (Signed)
Subjective:    Patient ID: Sabrina [name], female    DOB: 06-May-1961, 57 y.o.   MRN: [id number]  HPI Sabrina [name] is a 57 year old never smoker with a history of diastolic CHF, systemic hypertension, diabetes, obesity with ?? obstructive sleep apnea. She had a sleep study at [facility] before her gastric sgy. With a history of possible Lupus followed at [facility] and locally by Dr Tonia Brooms. She was seen by Rheum and it sounds like she was serology negative. Underwent gastric sleeve for wt loss (2016) by Dr Redmond Pulling, well tolerated. She took Phen-Fen for about 6 months, about 2 years. 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 She is on Adcirca 20mg  and Letaris 5mg , now on for about 2.5 months. She still has HA after the Adcirca. She has noticed a significant improvement in her breathing - able to exert much better. Her AHI on home sleep study was 8.8 / hour.   ROV 03/08/17 -- patient follows up today for her history of severe pulmonary hypertension in the setting of connective tissue disease, diastolic CHF, prior weight loss drug use. Also with a history of diastolic dysfunction. Since last time she had to stop Adcirca due to diarrhea and HA. She remains on Letaris. She is seeing Rheum at [facility] now, remaisn on Plaquanil. Mycophenolate added in June - has helped with skin thickness in her LE's. Her TTE from 2/18 was done on both PAH meds. She hasn't noticed that stopping the Adcirca has bothered her breathing. No significant swelling, no CP, no syncope or pre-syncope. We deferred CPAP therapy  ROV 06/14/17 -- Sabrina [name] has connective tissue disease followed by rheum at [facility], on plaquanil, cellcept. Also mild OSA. She is on letaris. She could not tolerate Adcirca, so we tried to start sildenifil > she has had diarrhea, HA, cannot take it. Currently on letaris and tolerating. She denies any new issues. Stable wt. No rash. No  CP.   ROV 09/13/17 --pleasant 57 year old woman, never smoker with a history of hypertension, diastolic CHF, obesity, diabetes, mild sleep apnea, connective tissue disease (w scleroderma features) on immunosuppression (Plaquenil, CellCept).  Severe secondary pulmonary hypertension in the setting of all of the above.  Currently managed on Letairis and tolerating.  She did not tolerate or Revatio.  6 minute Walk this morning and walked 348 m (O2).  Echocardiogram 09/10/17 reviewed, shows an estimated PASP of 52 mmHg (up from 36). She believes that her breathing has changed a bit - more fatigue, more difficulty climbing stairs.    She is having a bit more LE edema,  On lasix 40mg  daily.  On recent visit to rheumatology at [place] were made for a high-resolution CT scan of the chest to evaluate for interstitial disease.  Recent URI > required short course prednisone, now improved.      Recent labs at [facility] 09/06/17 > S Cr 1.1 (Stable)  [facility] 03/22/16  The left ventricular systolic function is normal.  The left ventricular ejection fraction is 55-65% by visual estimate.  Hemodynamic findings consistent with severe pulmonary hypertension.  PASP 114 mmHg Severe right ventricular systolic pressure and pulmonary hypertension with systolic pressures greater than 100 mmHg. Significant pulmonary capillary/LV gradient, without previous evidence of mitral stenosis on echocardiography. Normal coronary arteries. Normal LV systolic function with left ventricular hypertrophy. RECOMMENDATION: The patient will undergo 2-D echo Doppler study later today prior to being discharged. The etiology of her severe pulmonary hypertension is unknown and consider possible primary pulmonary hypertension. Echo Doppler study will  be helpful to further assess her mitral valve and potential for RV volume/pressure overload   TTE 09/14/16 >> good LV function, mildly dilated right ventricle, peak PA pressure 36 mm Hg TTE 09/10/17 >> intact left ventricular function, mildly dilated right ventricle, mildly reduced right ventricular function, estimated PASP 52 mmHg   Review of Systems  Constitutional: Negative.  Negative for fever and unexpected weight change.  HENT: Positive for sinus pressure and sneezing. Negative for congestion, dental problem, ear pain, nosebleeds, postnasal drip, rhinorrhea, sore throat and trouble swallowing.   Eyes: Positive for itching. Negative for redness.  Respiratory: Positive for cough, shortness of breath and wheezing. Negative for chest tightness.   Cardiovascular: Negative.  Negative for palpitations and leg swelling.  Gastrointestinal: Negative.  Negative for nausea and vomiting.  Endocrine: Negative.   Genitourinary: Negative.  Negative for dysuria.  Musculoskeletal: Negative.  Negative for joint swelling.  Skin: Negative.  Negative for rash.  Allergic/Immunologic: Positive for environmental allergies.  Neurological: Negative.  Negative for headaches.  Hematological: Bruises/bleeds easily.  Psychiatric/Behavioral: Negative.  Negative for dysphoric mood. The patient is not nervous/anxious.        Objective:   Physical Exam Vitals:   09/13/17 0922  BP: 118/64  Pulse: 75  SpO2: 92%  Weight: 199 lb (90.3 kg)  Height: 5' (1.524 m)   Gen: Pleasant, overwt woman, in no distress,  normal affect  ENT: No lesions,  mouth clear,  oropharynx clear, no postnasal drip  Neck: No JVD, no stridor  Lungs: No use of accessory muscles, clear without rales or rhonchi  Cardiovascular: RRR, S3+ versus a split S2, no M, trace to 1+ LE edema (new)  Musculoskeletal: No deformities, no cyanosis or clubbing  Neuro: alert, non focal  Skin: Warm, hyperpigmentation and some skin thickening on  bilateral lower extremities. 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  Being managed by rheumatology at [facility].  Currently on mycophenolate and Plaquenil.  Lab work stable.  She is having more dyspnea.  I do not hear any crackles on exam but the high-resolution CT scan of the chest is reasonable -this has already been scheduled at [facility].  I will perform pulmonary function testing here in [place].  She did not desaturate on ambulation.  Pulmonary hypertension (HCC) Multifactorial secondary pulmonary hypertension.  Currently managed on Letaris.  Did not tolerate sildenafil or Adcirca.  Depending on the evaluation we may need to add a second agent, possibly an inhaled prostacyclin.  I will repeat her echocardiogram versus obtain a right heart catheterization in approximately 6 months.  If the pressures are truly rising than we would talk about additional medication.  In the meantime we will extend the dyspnea workup as above.  Given her increasing lower extremity edema I will increase  her Lasix to 60 mg daily, arrange for her to follow-up with Dr. Curt Bears in cardiology.  Baltazar Apo, MD, PhD 09/13/2017, 10:11 AM [place] [facility] [phone] or if no answer [phone]

## 2017-09-13 NOTE — Assessment & Plan Note (Addendum)
Multifactorial secondary pulmonary hypertension.  Currently managed on Letaris.  Did not tolerate sildenafil or Adcirca.  Depending on the evaluation we may need to add a second agent, possibly an inhaled prostacyclin.  I will repeat her echocardiogram versus obtain a right heart catheterization in approximately 6 months.  If the pressures are truly rising than we would talk about additional medication.  In the meantime we will extend the dyspnea workup as above.  Given her increasing lower extremity edema I will increase her Lasix to 60 mg daily, arrange for her to follow-up with Dr. Curt Bears in cardiology.

## 2017-09-13 NOTE — Assessment & Plan Note (Signed)
With features of scleroderma.  Being managed by rheumatology at [facility].  Currently on mycophenolate and Plaquenil.  Lab work stable.  She is having more dyspnea.  I do not hear any crackles on exam but the high-resolution CT scan of the chest is reasonable -this has already been scheduled at [facility].  I will perform pulmonary function testing here in [place].  She did not desaturate on ambulation.

## 2017-11-01 ENCOUNTER — Other Ambulatory Visit: Payer: Self-pay | Admitting: Family Medicine

## 2017-11-01 ENCOUNTER — Telehealth: Payer: Self-pay

## 2017-11-01 MED ORDER — AZELASTINE HCL 0.1 % NA SOLN: 2.0000 | Freq: Two times a day (BID) | NASAL | 3 refills | Status: DC

## 2017-11-01 MED ORDER — FLUTICASONE PROPIONATE 50 MCG/ACT NA SUSP: 2.0000 | Freq: Every day | NASAL | 2 refills | Status: DC

## 2017-11-01 NOTE — Telephone Encounter (Signed)
Pt c/o sinus are blocked, she can't breathe at all through her nose. Just got back from a trip and needs something sent in to help open her up. Please advise

## 2017-11-02 ENCOUNTER — Other Ambulatory Visit: Payer: Self-pay | Admitting: Family Medicine

## 2017-11-02 NOTE — Telephone Encounter (Signed)
I spoke with spouse , he is aware that nasal sprays are prescribed, he stated he is hoars, can hardly speak, I will erx 5 days of prednisone 20 mg for him

## 2017-11-02 NOTE — Telephone Encounter (Signed)
Nasal sprays flonase and astelin are sent in

## 2017-11-04 ENCOUNTER — Telehealth: Payer: Self-pay | Admitting: Family Medicine

## 2017-11-04 NOTE — Telephone Encounter (Signed)
Pt LVM that [place] was [place] --and the nose spray is not working,, she needs a Shot or prednisone called in

## 2017-11-05 ENCOUNTER — Other Ambulatory Visit: Payer: Self-pay | Admitting: Family Medicine

## 2017-11-05 MED ORDER — PREDNISONE 5 MG (21) PO TBPK: 5.0000 mg | ORAL_TABLET | ORAL | 0 refills | Status: DC

## 2017-11-05 NOTE — Telephone Encounter (Signed)
Spoke with spouse and he is advised to tell his spouse that a prednisone dose pack has been prescribed. He then stated he is getting worse as far as his chest is concerned, I had prescribed prednisone for him this past weekend I told him I would prescribe tessalon perles for him, and he may need to call the office to be seen

## 2017-11-05 NOTE — Telephone Encounter (Signed)
Patient states that nasal spray doesn't work for her and the only thing that works is prednisone. Please advise

## 2017-11-13 DIAGNOSIS — J8489 Other specified interstitial pulmonary diseases: Secondary | ICD-10-CM | POA: Diagnosis not present

## 2017-11-13 DIAGNOSIS — M349 Systemic sclerosis, unspecified: Secondary | ICD-10-CM | POA: Diagnosis not present

## 2017-11-13 DIAGNOSIS — L910 Hypertrophic scar: Secondary | ICD-10-CM | POA: Diagnosis not present

## 2017-11-13 DIAGNOSIS — M351 Other overlap syndromes: Secondary | ICD-10-CM | POA: Diagnosis not present

## 2017-11-13 DIAGNOSIS — R911 Solitary pulmonary nodule: Secondary | ICD-10-CM | POA: Diagnosis not present

## 2017-11-13 DIAGNOSIS — M359 Systemic involvement of connective tissue, unspecified: Secondary | ICD-10-CM | POA: Diagnosis not present

## 2017-11-15 ENCOUNTER — Ambulatory Visit: Payer: 59 | Admitting: Emergency Medicine

## 2017-11-17 ENCOUNTER — Other Ambulatory Visit: Payer: Self-pay | Admitting: Family Medicine

## 2017-11-17 DIAGNOSIS — E559 Vitamin D deficiency, unspecified: Secondary | ICD-10-CM

## 2017-11-18 ENCOUNTER — Ambulatory Visit: Payer: 59 | Admitting: Family Medicine

## 2017-11-21 ENCOUNTER — Encounter: Payer: Self-pay | Admitting: Emergency Medicine

## 2017-11-21 ENCOUNTER — Ambulatory Visit: Payer: 59 | Admitting: Emergency Medicine

## 2017-11-21 DIAGNOSIS — I272 Pulmonary hypertension, unspecified: Secondary | ICD-10-CM | POA: Diagnosis not present

## 2017-11-21 DIAGNOSIS — J849 Interstitial pulmonary disease, unspecified: Secondary | ICD-10-CM | POA: Diagnosis not present

## 2017-11-21 LAB — PULMONARY FUNCTION TEST
DL/VA % pred: 97 %
DL/VA: 4.14 ml/min/mmHg/L
DLCO unc % pred: 60 %
DLCO unc: 11.41 ml/min/mmHg
FEF 25-75 Post: 1.66 L/sec
FEF 25-75 Pre: 1.91 L/sec
FEF2575-%Change-Post: -12 %
FEF2575-%Pred-Post: 82 %
FEF2575-%Pred-Pre: 94 %
FEV1-%Change-Post: 0 %
FEV1-%Pred-Post: 70 %
FEV1-%Pred-Pre: 70 %
FEV1-Post: 1.33 L
FEV1-Pre: 1.32 L
FEV1FVC-%Change-Post: -4 %
FEV1FVC-%Pred-Pre: 109 %
FEV6-%Change-Post: 4 %
FEV6-%Pred-Post: 68 %
FEV6-%Pred-Pre: 65 %
FEV6-Post: 1.56 L
FEV6-Pre: 1.5 L
FEV6FVC-%Pred-Post: 104 %
FEV6FVC-%Pred-Pre: 104 %
FVC-%Change-Post: 4 %
FVC-%Pred-Post: 65 %
FVC-%Pred-Pre: 63 %
FVC-Post: 1.56 L
FVC-Pre: 1.5 L
Post FEV1/FVC ratio: 85 %
Post FEV6/FVC ratio: 100 %
Pre FEV1/FVC ratio: 89 %
Pre FEV6/FVC Ratio: 100 %
RV % pred: 82 %
RV: 1.38 L
TLC % pred: 67 %
TLC: 2.99 L

## 2017-11-21 NOTE — Assessment & Plan Note (Signed)
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   Agree with adjusting your immunosuppression medication given your findings on CT chest from 11/2017 at [facility] Please continue your Letaris as you are taking it.  After you have been on new medication for about 6 months, we will plan to repeat your CT chest, pulmonary function testing. We will plan to recheck your echocardiogram and 6 minute walk at that time as well (October 2019).  Follow with Dr Lamonte Sakai in 3 months or sooner if you have any problems

## 2017-11-21 NOTE — Progress Notes (Signed)
Subjective:    Patient ID: Sabrina [name], female    DOB: 05-May-1961, 57 y.o.   MRN: [id number]  HPI Sabrina [name] is a 57 year old never smoker with a history of diastolic CHF, systemic hypertension, diabetes, obesity with ?? obstructive sleep apnea. She had a sleep study at [facility] before her gastric sgy. With a history of possible Lupus followed at [facility] and locally by Dr Tonia Brooms. She was seen by Rheum and it sounds like she was serology negative. Underwent gastric sleeve for wt loss (2016) by Dr Redmond Pulling, well tolerated. She took Phen-Fen for about 6 months, about 2 years. Former MTX use   ROV 09/13/17 --Sabrina [name], never smoker with a history of hypertension, diastolic CHF, obesity, diabetes, mild sleep apnea, connective tissue disease (w scleroderma features) on immunosuppression (Plaquenil, CellCept).  Severe secondary pulmonary hypertension Sabrina the setting of all of the above.  Currently managed on Letairis and tolerating.  She did not tolerate or Revatio.  6 minute Walk this morning and walked 348 m (O2).  Echocardiogram 09/10/17 reviewed, shows an estimated PASP of 52 mmHg (up from 36). She believes that her breathing has changed a bit - more fatigue, more difficulty climbing stairs.    She is having a bit more LE edema,  On lasix 40mg  daily.  On recent visit to rheumatology at [place] were made for a high-resolution CT scan of the chest to evaluate for interstitial disease.  Recent URI > required short course prednisone, now improved.      Recent labs at [facility] 09/06/17 > S Cr 1.1 (Stable)          ROV 11/21/17 --Sabrina [name] is a 66 with a history of severe secondary pulmonary hypertension Sabrina the setting of connective tissue disease (with scleroderma features), hypertension, diastolic CHF, mild obstructive sleep apnea.  She is on Plaquenil, CellCept.  She is been on methotrexate Sabrina the past remotely.  She is currently managed on Letairis.  She did not tolerate either Adcirca  or Revatio.  She had a slight increase Sabrina her lower extremity edema and also Sabrina her fatigue, dyspnea.  This prompted [place] to perform pulmonary function testing which was done today and I have reviewed.  This shows spirometry consistent with restriction, restriction confirmed on lung volumes, decreased diffusion capacity that corrects to the normal range when adjusted for her alveolar volume.  I do not have any other pulmonary function testing with which to compare.  She reports that she also had a high-resolution CT scan of her chest done at [facility] 11/13/17.  The report is below, indicates no evidence for lymphadenopathy a patulous esophagus, mild peribronchial vascular opacities Sabrina a reticular pattern with subpleural sparing suggestive of a possible NSIP pattern.  Based on this, she tells me that they are planning to change CellCept to an alternative, continue plaquanil.  She is having more nasal gtt and hoarseness, was treated by Dr Moshe Cipro with prednisone 2 weeks ago, a bit better now.            Last TTE and 6 minute walk were Sabrina January.  [facility] 03/22/16  The left ventricular systolic function is normal.  The left ventricular ejection fraction is 55-65% by visual estimate.  Hemodynamic findings consistent with severe pulmonary hypertension.  PASP 114 mmHg Severe right ventricular systolic pressure and pulmonary hypertension with systolic pressures greater than 100 mmHg. Significant pulmonary capillary/LV gradient, without previous evidence of mitral stenosis on echocardiography. Normal coronary arteries. Normal LV systolic function with left ventricular hypertrophy. RECOMMENDATION: The patient will undergo 2-D echo Doppler study later today prior to being discharged. The etiology of her severe pulmonary hypertension is unknown and consider possible primary pulmonary hypertension.  Echo Doppler study will be helpful to further assess her mitral valve and potential for RV volume/pressure overload   TTE 09/14/16 >> good LV function, mildly dilated right ventricle, peak PA pressure 36 mm Hg TTE 09/10/17 >> intact left ventricular function, mildly dilated right ventricle, mildly reduced right ventricular function, estimated PASP 52 mmHg   Review of Systems  Constitutional: Negative.  Negative for fever and unexpected weight change.  HENT: Positive for sinus pressure and sneezing. Negative for congestion, dental problem, ear pain, nosebleeds, postnasal drip, rhinorrhea, sore throat and trouble swallowing.   Eyes: Positive for itching. Negative for redness.  Respiratory: Positive for cough, shortness of breath and wheezing. Negative for chest tightness.   Cardiovascular: Negative.  Negative for palpitations and leg swelling.  Gastrointestinal: Negative.  Negative for nausea and vomiting.  Endocrine: Negative.   Genitourinary: Negative.  Negative for dysuria.  Musculoskeletal: Negative.  Negative for joint swelling.  Skin: Negative.  Negative for rash.  Allergic/Immunologic: Positive for environmental allergies.  Neurological: Negative.  Negative for headaches.  Hematological: Bruises/bleeds easily.  Psychiatric/Behavioral: Negative.  Negative for dysphoric mood. The patient is not nervous/anxious.        Objective:   Physical Exam Vitals:   11/21/17 1114 11/21/17 1118  BP:  126/82  Pulse:  68  SpO2:  98%  Weight: 203 lb (92.1 kg)   Height: 5' (1.524 m)    Gen: Sabrina, Sabrina [name], Sabrina [name],  normal affect  ENT: No lesions,  mouth clear,  oropharynx clear, no postnasal drip  Neck: No JVD, no stridor  Lungs: No use of accessory muscles, clear without rales or rhonchi  Cardiovascular: RRR, S3+ versus a split S2, no M, trace to 1+ LE edema (new)  Musculoskeletal: No deformities, no cyanosis or clubbing  Neuro: alert, non focal  Skin: Warm,  hyperpigmentation and some skin thickening on bilateral lower extremities. Also some hyperpigmentation on the chest and back   CT chest high res ([facility]) 11/13/17 >>  Comparison: None  Findings:  The central airways patent. Moderate coronary artery calcifications. Patulous esophagus with fluid level Sabrina the midesophagus. There are no discrete pathologically enlarged axillary, mediastinal, or hilar lymph nodes. Trace pericardial fluid. The upper abdomen is unremarkable. No aggressive bone lesion.  No pleural effusion or pneumothorax. No significant air trapping. There are mild peribronchovascular opacities and reticular abnormality. There is subpleural sparing which is most notable Sabrina the medial right lower lobe. There is a 0.3 cm left lower lobe pulmonary nodule (series 4, image 272). Very mild lower lobe bronchiectasis. There are scattered areas of groundglass opacities particularly Sabrina the left upper lobe. No significant air trapping. No honeycombing. There is a likely fissural nodule along the minor fissure Sabrina the right lung.  IMPRESSION:  1. 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  2. There is a 0.3 cm left  lower lobe pulmonary nodule which may be due to prior infection. Consider follow up CT Sabrina 12 months if patient is high risk for primary lung malignancy.      Assessment & Plan:  Pulmonary hypertension (HCC) Secondary PAH Sabrina setting auto-immune disease, hx Fen-Phen, mild OSA (not treated), new finding of some evolving ILD.   Agree with adjusting your immunosuppression medication given your findings on CT chest from 11/2017 at [facility] Please continue your Letaris as you are taking it.  After you have been on new medication for about 6 months, we will plan to repeat your CT chest, pulmonary function testing. We will plan to recheck your echocardiogram and 6 minute walk at that time as well (October 2019).    Follow with Dr Lamonte Sakai Sabrina 3 months or sooner if you have any problems  ILD (interstitial lung disease) ([place]) Sabrina an NSIP pattern, noted on CT chest from [facility] this month. I will obtain copies to review. Report indicates changes consistent w NSIP. Suspect related to her connective tissue dz. Her immunosuppression is about to be altered. Will plan to follow repeat scan and PFT at that time. Will need to discuss options for therapy with [facility] once this has been done. ? A candidate for OFEV / pirfenidone depending on persistence or progression.    Baltazar Apo, MD, PhD 11/21/2017, 11:51 AM [place] [facility] [phone] or if no answer [phone]

## 2017-11-21 NOTE — Assessment & Plan Note (Addendum)
In an NSIP pattern, noted on CT chest from [facility] this month. I will obtain copies to review. Report indicates changes consistent w NSIP. Suspect related to her connective tissue dz. Her immunosuppression is about to be altered. Will plan to follow repeat scan and PFT at that time. Will need to discuss options for therapy with [facility] once this has been done. ? A candidate for OFEV / pirfenidone depending on persistence or progression.

## 2017-11-21 NOTE — Patient Instructions (Addendum)
Agree with adjusting your immunosuppression medication given your findings on CT chest from 11/2017 at [facility] Please continue your Letaris as you are taking it.  After you have been on new medication for about 6 months, we will plan to repeat your CT chest, pulmonary function testing. We will plan to recheck your echocardiogram and 6 minute walk at that time as well (October 2019).  Continue to use flonase and astelin nasal sprays for your nasal congestion.  Follow with Dr Lamonte Sakai in 3 months or sooner if you have any problems.

## 2017-12-01 ENCOUNTER — Other Ambulatory Visit: Payer: Self-pay | Admitting: Emergency Medicine

## 2017-12-12 ENCOUNTER — Telehealth: Payer: Self-pay | Admitting: Emergency Medicine

## 2017-12-12 ENCOUNTER — Telehealth: Payer: Self-pay | Admitting: Family Medicine

## 2017-12-12 NOTE — Telephone Encounter (Signed)
Spoke with patient. She is aware of RB's recs. She will stop the Letaris and will call us next week to report her symptoms.   Nothing else needed at time of call.

## 2017-12-12 NOTE — Telephone Encounter (Signed)
Patient notified on voicemail.

## 2017-12-12 NOTE — Telephone Encounter (Signed)
Cb#: [id number] / [id number]  Patient wants to know if she needs labs for her appt on 12/19/17

## 2017-12-12 NOTE — Telephone Encounter (Signed)
Spoke with patient. She stated that for the past 2 weeks she has been waking up with a swollen face and feeling extremely tired. Denies any SOB or chest pains. She believes this is related to the [place]. She wants to know if she should temporary stop taking Letaris to see if the symptoms will go away. She has been on Letaris for at least 6 months.   RB, please advise. Thanks!

## 2017-12-12 NOTE — Telephone Encounter (Signed)
It is Ok with me for her to temporarily stop the [place]. She needs to call us in 1 week to let us know if there has been any change. Even if there is a relationship to the [place], we may decide to restart it, adjust her medications further to allow her to stay on it. We can discuss next week

## 2017-12-17 DIAGNOSIS — Z79899 Other long term (current) drug therapy: Secondary | ICD-10-CM | POA: Diagnosis not present

## 2017-12-17 DIAGNOSIS — D509 Iron deficiency anemia, unspecified: Secondary | ICD-10-CM | POA: Diagnosis not present

## 2017-12-17 DIAGNOSIS — R7301 Impaired fasting glucose: Secondary | ICD-10-CM | POA: Diagnosis not present

## 2017-12-17 LAB — CBC
HEMATOCRIT: 35.8 % (ref 35.0–45.0)
HEMOGLOBIN: 11.9 g/dL (ref 11.7–15.5)
MCH: 26.4 pg — AB (ref 27.0–33.0)
MCHC: 33.2 g/dL (ref 32.0–36.0)
MCV: 79.4 fL — AB (ref 80.0–100.0)
MPV: 11.3 fL (ref 7.5–12.5)
Platelets: 332 10*3/uL (ref 140–400)
RBC: 4.51 10*6/uL (ref 3.80–5.10)
RDW: 14.8 % (ref 11.0–15.0)
WBC: 13.1 10*3/uL — ABNORMAL HIGH (ref 3.8–10.8)

## 2017-12-17 LAB — IRON: Iron: 48 ug/dL (ref 45–160)

## 2017-12-19 ENCOUNTER — Encounter: Payer: Self-pay | Admitting: Family Medicine

## 2017-12-19 ENCOUNTER — Ambulatory Visit (INDEPENDENT_AMBULATORY_CARE_PROVIDER_SITE_OTHER): Payer: 59 | Admitting: Family Medicine

## 2017-12-19 VITALS — BP 120/70 | HR 84 | Resp 16 | Ht 60.0 in | Wt 197.0 lb

## 2017-12-19 DIAGNOSIS — E1169 Type 2 diabetes mellitus with other specified complication: Secondary | ICD-10-CM | POA: Diagnosis not present

## 2017-12-19 DIAGNOSIS — E038 Other specified hypothyroidism: Secondary | ICD-10-CM

## 2017-12-19 DIAGNOSIS — R7989 Other specified abnormal findings of blood chemistry: Secondary | ICD-10-CM

## 2017-12-19 DIAGNOSIS — R5382 Chronic fatigue, unspecified: Secondary | ICD-10-CM | POA: Diagnosis not present

## 2017-12-19 DIAGNOSIS — E039 Hypothyroidism, unspecified: Secondary | ICD-10-CM

## 2017-12-19 DIAGNOSIS — E785 Hyperlipidemia, unspecified: Secondary | ICD-10-CM

## 2017-12-19 DIAGNOSIS — D500 Iron deficiency anemia secondary to blood loss (chronic): Secondary | ICD-10-CM | POA: Diagnosis not present

## 2017-12-19 DIAGNOSIS — N2581 Secondary hyperparathyroidism of renal origin: Secondary | ICD-10-CM | POA: Diagnosis not present

## 2017-12-19 DIAGNOSIS — I272 Pulmonary hypertension, unspecified: Secondary | ICD-10-CM

## 2017-12-19 DIAGNOSIS — E559 Vitamin D deficiency, unspecified: Secondary | ICD-10-CM

## 2017-12-19 DIAGNOSIS — E8881 Metabolic syndrome: Secondary | ICD-10-CM

## 2017-12-19 DIAGNOSIS — I5032 Chronic diastolic (congestive) heart failure: Secondary | ICD-10-CM | POA: Diagnosis not present

## 2017-12-19 NOTE — Assessment & Plan Note (Signed)
1 month h/o excessive fatigue, need to test TSH and refer to endo Uncertain how much is s/e of medications and cardiopulmonary disease

## 2017-12-19 NOTE — Assessment & Plan Note (Addendum)
Updated lab needed and to be followed by endo last test was abnormal in 2018

## 2017-12-19 NOTE — Assessment & Plan Note (Signed)
Updated lab needed and referral to endo with an established date

## 2017-12-19 NOTE — Progress Notes (Signed)
   Sabrina [name]     MRN: [id number]      DOB: [birth year]   HPI Sabrina [name] is here for follow up and re-evaluation of chronic medical conditions, medication management and review of any available recent lab and radiology data.  Preventive health is updated, specifically  Cancer screening and Immunization.   1 month h/o excessive fatigue, also reports loss of taste She still has been unable to get an appointment with Dr Dorris Fetch re her thyroid management and is requesting assistance C/o possible allergic reaction to letairis in the recent past, she has been on this medication for over 4 months, however, was told earlier this weekly Prescribed to hold the medication and see if her symptoms improve which they  have , she  Is to check in tomorrow with Pulmonary for further instructions Requesting clotrimazole/betamethasone for face and sees dermatology in am so I have advised she defer to them as thew steroid I potent.Stes she recently had her left ear lobe split on its own ROS Denies recent fever or chills. Denies sinus pressure, nasal congestion, ear pain or sore throat. Denies chest congestion, productive cough or wheezing. Denies chest pains, palpitations and leg swelling Denies abdominal pain, nausea, vomiting,diarrhea or constipation.   Denies dysuria, frequency, hesitancy or incontinence. Denies joint pain, swelling and limitation in mobility. Denies headaches, seizures, numbness, or tingling. Denies depression, anxiety or insomnia.  PE  BP 120/70   Pulse 84   Resp 16   Ht 5' (1.524 m)   Wt 197 lb (89.4 kg)   LMP 10/31/2002   SpO2 93%   BMI 38.47 kg/m   Patient alert and oriented and in no cardiopulmonary distress.  HEENT: No facial asymmetry, EOMI,   oropharynx pink and moist.  Neck supple no JVD, no mass.  Chest: Clear to auscultation bilaterally.  CVS: S1, S2 no murmurs, no S3.Regular rate.  ABD: Soft non tender.   Ext: No edema  MS: Adequate ROM spine,  shoulders, hips and knees.  Skin: Intact, no ulcerations or rash noted.  Psych: Good eye contact, normal affect. Memory intact not anxious or depressed appearing.  CNS: CN 2-12 intact, power,  normal throughout.no focal deficits noted.   Assessment & Plan  Chronic fatigue 1 month h/o excessive fatigue, need to test TSH and refer to endo Uncertain how much is s/e of medications and cardiopulmonary disease  Elevated TSH Updated lab needed and referral to endo with an established date  Morbid obesity ([place]) unchanged. Patient re-educated about  the importance of commitment to a  minimum of 150 minutes of exercise per week.  The importance of healthy food choices with portion control discussed. Encouraged to start a food diary, count calories and to consider  joining a support group. Sample diet sheets offered. Goals set by the patient for the next several months.   Weight /BMI 12/19/2017 11/21/2017 09/13/2017  WEIGHT 197 lb 203 lb 199 lb  HEIGHT 5\' 0"  5\' 0"  5\' 0"   BMI 38.47 kg/m2 39.65 kg/m2 38.86 kg/m2      Vitamin D deficiency Updated lab needed at/ before next visit.   Secondary hyperparathyroidism ([place]) Updated lab needed and to be followed by endo last test was abnormal in 8563  Chronic diastolic heart failure ([facility]) Pt weith 1 month h/o increased fatigue and chronic diastolic heart failure, refer  To cardiology for re eval, this is past due

## 2017-12-19 NOTE — Patient Instructions (Addendum)
F/U in 4.5 month, call if you need me before  You  Will be referred to Dr Dorris Fetch re your thyroid and we will get you an appointment and a  my Chart message will be sent to you  Additional labs will be added, HBA1C, lipid and TSH and Vit D, also pTH and calcium  I am referring you to Dr Shonna Chock for re evaluation , you should have ahd a f/u in October and you are having increased fatigue   Discuss cream for face with your dermatologist please  Thank you  for choosing [facility]. We consider it a privelige to serve you.  Delivering excellent health care in a caring and  compassionate way is our goal.  Partnering with you,  so that together we can achieve this goal is our strategy.

## 2017-12-19 NOTE — Assessment & Plan Note (Addendum)
unchanged. Patient re-educated about  the importance of commitment to a  minimum of 150 minutes of exercise per week.  The importance of healthy food choices with portion control discussed. Encouraged to start a food diary, count calories and to consider  joining a support group. Sample diet sheets offered. Goals set by the patient for the next several months.   Weight /BMI 12/19/2017 11/21/2017 09/13/2017  WEIGHT 197 lb 203 lb 199 lb  HEIGHT 5\' 0"  5\' 0"  5\' 0"   BMI 38.47 kg/m2 39.65 kg/m2 38.86 kg/m2

## 2017-12-19 NOTE — Assessment & Plan Note (Signed)
Pt weith 1 month h/o increased fatigue and chronic diastolic heart failure, refer  To cardiology for re eval, this is past due

## 2017-12-19 NOTE — Assessment & Plan Note (Signed)
Updated lab needed at/ before next visit.   

## 2017-12-20 DIAGNOSIS — S0991XD Unspecified injury of ear, subsequent encounter: Secondary | ICD-10-CM | POA: Diagnosis not present

## 2017-12-20 DIAGNOSIS — S0991XS Unspecified injury of ear, sequela: Secondary | ICD-10-CM | POA: Diagnosis not present

## 2017-12-20 DIAGNOSIS — L910 Hypertrophic scar: Secondary | ICD-10-CM | POA: Diagnosis not present

## 2017-12-20 NOTE — Addendum Note (Signed)
Addended by: Eual Fines on: 12/20/2017 11:44 AM   Modules accepted: Orders

## 2017-12-23 DIAGNOSIS — E785 Hyperlipidemia, unspecified: Secondary | ICD-10-CM | POA: Diagnosis not present

## 2017-12-23 DIAGNOSIS — E1169 Type 2 diabetes mellitus with other specified complication: Secondary | ICD-10-CM | POA: Diagnosis not present

## 2017-12-24 LAB — HEMOGLOBIN A1C
HEMOGLOBIN A1C: 5.7 %{Hb} — AB (ref <5.7)
Mean Plasma Glucose: 117 (calc)
eAG (mmol/L): 6.5 (calc)

## 2017-12-24 LAB — LIPID PANEL
Cholesterol: 166 mg/dL (ref <200)
HDL: 35 mg/dL — ABNORMAL LOW (ref >50)
LDL Cholesterol (Calc): 109 mg/dL (calc) — ABNORMAL HIGH
NON-HDL CHOLESTEROL (CALC): 131 mg/dL — AB (ref <130)
TRIGLYCERIDES: 109 mg/dL (ref <150)
Total CHOL/HDL Ratio: 4.7 (calc) (ref <5.0)

## 2017-12-24 LAB — VITAMIN D 25 HYDROXY (VIT D DEFICIENCY, FRACTURES): VIT D 25 HYDROXY: 24 ng/mL — AB (ref 30–100)

## 2017-12-24 LAB — PTH, INTACT AND CALCIUM
Calcium: 6.8 mg/dL — ABNORMAL LOW (ref 8.6–10.4)
PTH: 148 pg/mL — AB (ref 14–64)

## 2017-12-24 LAB — TSH: TSH: 7.89 m[IU]/L — AB

## 2017-12-30 ENCOUNTER — Other Ambulatory Visit: Payer: Self-pay | Admitting: Family Medicine

## 2017-12-30 ENCOUNTER — Encounter: Payer: Self-pay | Admitting: Family Medicine

## 2017-12-30 DIAGNOSIS — R7989 Other specified abnormal findings of blood chemistry: Secondary | ICD-10-CM

## 2017-12-30 MED ORDER — ERGOCALCIFEROL 1.25 MG (50000 UT) PO CAPS: 50000.0000 [IU] | ORAL_CAPSULE | ORAL | 1 refills | Status: DC

## 2017-12-30 NOTE — Addendum Note (Signed)
Addended by: Fayrene Helper on: 12/30/2017 05:42 PM   Modules accepted: Orders

## 2018-01-08 ENCOUNTER — Encounter (INDEPENDENT_AMBULATORY_CARE_PROVIDER_SITE_OTHER): Payer: Self-pay

## 2018-01-20 ENCOUNTER — Ambulatory Visit: Payer: 59 | Admitting: Cardiology

## 2018-01-20 ENCOUNTER — Encounter: Payer: Self-pay | Admitting: Cardiology

## 2018-01-20 ENCOUNTER — Other Ambulatory Visit: Payer: Self-pay

## 2018-01-20 ENCOUNTER — Telehealth: Payer: Self-pay

## 2018-01-20 VITALS — BP 130/82 | HR 74 | Ht 60.0 in | Wt 197.8 lb

## 2018-01-20 DIAGNOSIS — R9431 Abnormal electrocardiogram [ECG] [EKG]: Secondary | ICD-10-CM

## 2018-01-20 DIAGNOSIS — I272 Pulmonary hypertension, unspecified: Secondary | ICD-10-CM

## 2018-01-20 DIAGNOSIS — I10 Essential (primary) hypertension: Secondary | ICD-10-CM | POA: Diagnosis not present

## 2018-01-20 DIAGNOSIS — Z79899 Other long term (current) drug therapy: Secondary | ICD-10-CM | POA: Diagnosis not present

## 2018-01-20 LAB — BASIC METABOLIC PANEL
BUN/Creatinine Ratio: 13 (ref 9–23)
BUN: 16 mg/dL (ref 6–24)
CALCIUM: 6.6 mg/dL — AB (ref 8.7–10.2)
CO2: 28 mmol/L (ref 20–29)
Chloride: 96 mmol/L (ref 96–106)
Creatinine, Ser: 1.21 mg/dL — ABNORMAL HIGH (ref 0.57–1.00)
GFR calc Af Amer: 59 mL/min/{1.73_m2} — ABNORMAL LOW (ref >59)
GFR, EST NON AFRICAN AMERICAN: 51 mL/min/{1.73_m2} — AB (ref >59)
Glucose: 95 mg/dL (ref 65–99)
POTASSIUM: 2.7 mmol/L — AB (ref 3.5–5.2)
Sodium: 143 mmol/L (ref 134–144)

## 2018-01-20 MED ORDER — METOPROLOL TARTRATE 25 MG PO TABS: 12.5000 mg | ORAL_TABLET | Freq: Two times a day (BID) | ORAL | 3 refills | Status: DC

## 2018-01-20 NOTE — Progress Notes (Signed)
Electrophysiology Office Note   Date:  01/20/2018   ID:  Sabrina [name], Sabrina [name] 1961/05/23, MRN [id number]  PCP:  Fayrene Helper, MD  Cardiologist:  Radford Pax Primary Electrophysiologist:  Damiana Berrian Meredith Leeds, MD    Chief Complaint  Patient presents with  . Long QT Syndrome     History of Present Illness: Sabrina [name] is a 57 y.o. female who presents today for electrophysiology evaluation.   Hx of morbid obesity, HTN, HL, GERD, DM2. She had a recent hospitalization for acute hypoxic respiratoyr failure, pulmonary hypertension, diastolic HF. 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 She started the medications last weekend, but had significant GI symptoms as well as headaches. She also has long QT syndrome with a QTc of 523.  Today, denies symptoms of palpitations, chest pain, shortness of breath, orthopnea, PND, lower extremity edema, claudication, dizziness, presyncope, syncope, bleeding, or neurologic sequela. The patient is tolerating medications without difficulties.  She overall feels well.  She does not have any major complaints.  She does say that her tastes have changed which occurred in March.  She is started no new medications.   Past Medical History:  Diagnosis Date  . Allergy   . Anemia   . Arthritis   . Asthma   . Chronic bronchitis ([place])   . Connective tissue disease ([place])   . Diabetes mellitus   . Endometriosis   . Fibroid uterus   . GERD (gastroesophageal reflux disease)   . History of Doppler ultrasound    a. Carotid US 8/08:Estimated stenosis in the right and left internal carotid arteries is 0-50% and 0-50%.  . History of echocardiogram    a. Echo 6/16: EF 60-65%, no RWMA.  . History of hiatal hernia   . History of nuclear stress test    a. Myoview 6/16: There is no resting or stress perfusion defect consistent with no prior infarct and no ischemia. EF 73 %  The patient was  hypertensive prior and during the study. Cardiac cath 04/2016 showed normal coronary arteries  . Hx of iron deficiency anemia   . Hypersomnia with sleep apnea   . Hypertension   . Lupus ([place]) 07/2013   Of Skin  . Menorrhagia   . Obesity   . Pneumonia   . 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 Secondary hyperthyroidism   . Thyroid disease   . Tinnitus    left ear  . Tubular adenoma of colon   . Vertigo   . Vitamin D deficiency    Past Surgical History:  Procedure Laterality Date  . ABDOMINAL HYSTERECTOMY  2004   supracervical hysterectomy  . BREAST SURGERY  2005   reduction  . BREATH TEK H PYLORI [name] 12/24/2014   Procedure: BREATH TEK H PYLORI;  Surgeon: Greer Pickerel, MD;  Location: [facility];  Service: General;  Laterality: [name];  . CARDIAC CATHETERIZATION [name] 03/22/2016   Procedure: Right/Left Heart Cath and Coronary Angiography;  Surgeon: Troy Sine, MD;  Location: [facility];  Service: Cardiovascular;  Laterality: [name];  . DILATION AND CURETTAGE OF UTERUS     age 53  . ESOPHAGOGASTRODUODENOSCOPY (EGD) WITH PROPOFOL [name] 10/14/2015   Procedure: ESOPHAGOGASTRODUODENOSCOPY (EGD) WITH PROPOFOL;  Surgeon: Clarene Essex, MD;  Location: [facility];  Service: Endoscopy;  Laterality: [name];  . LAPAROSCOPIC GASTRIC SLEEVE RESECTION WITH HIATAL HERNIA REPAIR [name] 07/05/2015   Procedure: LAPAROSCOPIC GASTRIC SLEEVE RESECTION WITH HIATAL  HERNIA REPAIR, upper endoscopy;  Surgeon: Greer Pickerel, MD;  Location: [facility];  Service: General;  Laterality: [name];  . REDUCTION MAMMAPLASTY Bilateral      Current Outpatient Medications  Medication Sig Dispense Refill  . acetaminophen (TYLENOL) 500 MG tablet Take 500 mg by mouth every 6 (six) hours as needed for headache.    [name] aspirin EC 81 MG tablet Take 1 tablet (81 mg total) by mouth daily.    [name] azelastine (ASTELIN) 0.1 % nasal spray Place 2 sprays into both nostrils 2 (two) times daily. Use in each nostril as directed 30 mL 3  . ergocalciferol  (VITAMIN D2) 50000 units capsule Take 1 capsule (50,000 Units total) by mouth once a week. One capsule once weekly 12 capsule 1  . furosemide (LASIX) 20 MG tablet Take 3 tablets (60 mg total) by mouth daily. 90 tablet 5  . LETAIRIS 5 MG tablet TAKE 1 TABLET BY MOUTH EVERY DAY 30 tablet 5  . Multiple Vitamin (MULTIVITAMIN WITH MINERALS) TABS tablet Take 1 tablet by mouth daily.    . mycophenolate (CELLCEPT) 500 MG tablet     . nitroGLYCERIN (NITROSTAT) 0.4 MG SL tablet Place 0.4 mg under the tongue every 5 (five) minutes as needed for chest pain (3 DOSES MAX).    [name] pantoprazole (PROTONIX) 40 MG tablet TAKE 1 TABLET BY MOUTH EVERY DAY 30 tablet 5  . potassium chloride (K-DUR) 10 MEQ tablet Take 1 tablet (10 mEq total) by mouth 2 (two) times daily. 60 tablet 5  . temazepam (RESTORIL) 15 MG capsule TAKE ONE CAPSULE BY MOUTH AT BEDTIME AS NEEDED FOR SLEEP 30 capsule 0  . triamcinolone ointment (KENALOG) 0.1 % APPLY AS DIRECTED TO ITCHING SPOTS ON THE BODY DAILY TO TWICE DAILY AS NEEDED. NOT TO FACE.    . metoprolol tartrate (LOPRESSOR) 25 MG tablet Take 0.5 tablets (12.5 mg total) by mouth 2 (two) times daily. 30 tablet 3   No current facility-administered medications for this visit.     Allergies:   Peanut-containing drug products; Potassium iodide; Ace inhibitors; Codeine; Doxycycline; Peanut oil; Amlodipine; Latex; and Penicillins   Social History:  The patient  reports that she has never smoked. She has never used smokeless tobacco. She reports that she does not drink alcohol or use drugs.   Family History:  The patient's family history includes Arthritis in her father, mother, and sister; Bleeding Disorder in her sister; Breast cancer in her sister; CAD in her mother; Cancer in her father; Colon cancer in her maternal grandfather; Depression in her father, mother, and sister; Diabetes in her father and mother; Hypertension in her father, mother, and sister; Stroke in her father and maternal aunt;  Thyroid disease in her sister.    ROS:  Please see the history of present illness.   Otherwise, review of systems is positive for none.   All other systems are reviewed and negative.   PHYSICAL EXAM: VS:  BP 130/82   Pulse 74   Ht 5' (1.524 m)   Wt 197 lb 12.8 oz (89.7 kg)   LMP 10/31/2002   BMI 38.63 kg/m  , BMI Body mass index is 38.63 kg/m. GEN: Well nourished, well developed, in no acute distress  HEENT: normal  Neck: no JVD, carotid bruits, or masses Cardiac: RRR; no murmurs, rubs, or gallops,no edema  Respiratory:  clear to auscultation bilaterally, normal work of breathing GI: soft, nontender, nondistended, + BS MS: no deformity or atrophy  Skin: warm and dry Neuro:  Strength and sensation are intact Psych: euthymic mood, full affect  EKG:  EKG is ordered today. Personal review of the ekg ordered shows anus rhythm, right axis deviation, RVH, inferior Q waves, inferolateral T wave inversions, prolonged QTC 546 ms  Recent Labs: 03/08/2017: ALT 6 06/24/2017: BUN 20; Creat 1.14; Potassium 3.4; Sodium 142 12/17/2017: Hemoglobin 11.9; Platelets 332 12/23/2017: TSH 7.89    Lipid Panel     Component Value Date/Time   CHOL 166 12/23/2017 0738   TRIG 109 12/23/2017 0738   HDL 35 (L) 12/23/2017 0738   CHOLHDL 4.7 12/23/2017 0738   VLDL 18 12/11/2016 0743   LDLCALC 109 (H) 12/23/2017 0738     Wt Readings from Last 3 Encounters:  01/20/18 197 lb 12.8 oz (89.7 kg)  12/19/17 197 lb (89.4 kg)  11/21/17 203 lb (92.1 kg)      Other studies Reviewed: Additional studies/ records that were reviewed today include: TTE 09/10/17 Review of the above records today demonstrates:  - Left ventricle: The cavity size was normal. Wall thickness was   increased in a pattern of mild LVH. Systolic function was normal.   The estimated ejection fraction was in the range of 60% to 65%.   Wall motion was normal; there were no regional wall motion   abnormalities. The study is not technically  sufficient to allow   evaluation of LV diastolic function. - Aortic valve: Mildly calcified annulus. Trileaflet; mildly   calcified leaflets. Mean gradient (S): 8 mm Hg. Valve area (VTI):   1.76 cm^2. - Mitral valve: Mildly calcified annulus. There was trivial   regurgitation. - Right ventricle: The cavity size was mildly dilated. Systolic   function was mildly reduced. - Right atrium: Central venous pressure (est): 15 mm Hg. - Tricuspid valve: There was mild regurgitation. - Pulmonary arteries: PA peak pressure: 52 mm Hg (S). - Pericardium, extracardiac: There was no pericardial effusion.   Cath 03/22/16 Severe right ventricular systolic pressure and pulmonary hypertension with systolic pressures greater than 100 mmHg.  Significant pulmonary capillary/LV gradient, without previous evidence of mitral stenosis on echocardiography.  Normal coronary arteries.  Normal LV systolic function with left ventricular hypertrophy.  Holter 08/17/16 Minimum HR: 73 BPM at 6:18:06 AM(2) Maximum HR: 130 BPM at 5:07:20 PM(2) Average HR: 88 BPM Rare PVCs and APCs Sinus rhythm with sinus tachycardia  ASSESSMENT AND PLAN:  1. Prolonged QT -she did have improvement in her QTC, but it is lengthened again.  Due to that, we Maygen Sirico plan to start her on metoprolol 12.5 mg twice a day.  She has had trouble with beta-blockers in the past, but I do feel that this would be a necessary thing with her long QT today.  Hopefully she tolerates this medication which can be uptitrated by her primary cardiologist at the next visit.  I Abhinav Mayorquin see her back in 3 months.  2. HTN -well-controlled today.  No changes.  3.  Pulmonary hypertension: Has follow-up with pulmonary.  We Deepti Gunawan also arrange for her to see her primary cardiologist.  Current medicines are reviewed at length with the patient today.   The patient does not have concerns regarding her medicines.  The following changes were made today: Start  metoprolol  Labs/ tests ordered today include:  Orders Placed This Encounter  Procedures  . Basic Metabolic Panel (BMET)  . EKG 12-Lead     Disposition:   FU with Takyia Sindt 3 months  Signed, Genaro Bekker Meredith Leeds, MD  01/20/2018 8:44 AM  [address] [place] [address] [phone] (office) [phone] (fax)

## 2018-01-20 NOTE — Patient Instructions (Addendum)
Medication Instructions:  Your physician has recommended you make the following change in your medication:  1. START Metoprolol Tartrate 12.5 mg twice daily  Labwork: BMET today     *We will only notify you of abnormal results, otherwise continue current treatment plan.  Testing/Procedures: None ordered  Follow-Up: Your physician recommends that you schedule a follow-up appointment in: 3 months with Dr. Curt Bears.  Your physician recommends that you schedule a follow-up appointment with Dr. Radford Pax.    * If you need a refill on your cardiac medications before your next appointment, please call your pharmacy.   *Please note that any paperwork needing to be filled out by the provider will need to be addressed at the front desk prior to seeing the provider. Please note that any FMLA, disability or other documents regarding health condition is subject to a $25.00 charge that must be received prior to completion of paperwork in the form of a money order or check.  Thank you for choosing [facility]!!   Trinidad Curet, RN [phone]  Any Other Special Instructions Will Be Listed Below (If Applicable).   Metoprolol tablets What is this medicine? METOPROLOL (me TOE proe lole) is a beta-blocker. Beta-blockers reduce the workload on the heart and help it to beat more regularly. This medicine is used to treat high blood pressure and to prevent chest pain. It is also used to after a heart attack and to prevent an additional heart attack from occurring. This medicine may be used for other purposes; ask your health care provider or pharmacist if you have questions. COMMON BRAND NAME(S): Lopressor What should I tell my health care provider before I take this medicine? They need to know if you have any of these conditions: -diabetes -heart or vessel disease like slow heart rate, worsening heart failure, heart block, sick sinus syndrome or Raynaud's disease -kidney disease -liver  disease -lung or breathing disease, like asthma or emphysema -pheochromocytoma -thyroid disease -an unusual or allergic reaction to metoprolol, other beta-blockers, medicines, foods, dyes, or preservatives -pregnant or trying to get pregnant -breast-feeding How should I use this medicine? Take this medicine by mouth with a drink of water. Follow the directions on the prescription label. Take this medicine immediately after meals. Take your doses at regular intervals. Do not take more medicine than directed. Do not stop taking this medicine suddenly. This could lead to serious heart-related effects. Talk to your pediatrician regarding the use of this medicine in children. Special care may be needed. Overdosage: If you think you have taken too much of this medicine contact a poison control center or emergency room at once. NOTE: This medicine is only for you. Do not share this medicine with others. What if I miss a dose? If you miss a dose, take it as soon as you can. If it is almost time for your next dose, take only that dose. Do not take double or extra doses. What may interact with this medicine? This medicine may interact with the following medications: -certain medicines for blood pressure, heart disease, irregular heart beat -certain medicines for depression like monoamine oxidase (MAO) inhibitors, fluoxetine, or paroxetine -clonidine -dobutamine -epinephrine -isoproterenol -reserpine This list may not describe all possible interactions. Give your health care provider a list of all the medicines, herbs, non-prescription drugs, or dietary supplements you use. Also tell them if you smoke, drink alcohol, or use illegal drugs. Some items may interact with your medicine. What should I watch for while using this medicine? Visit  your doctor or health care professional for regular check ups. Contact your doctor right away if your symptoms worsen. Check your blood pressure and pulse rate  regularly. Ask your health care professional what your blood pressure and pulse rate should be, and when you should contact them. You may get drowsy or dizzy. Do not drive, use machinery, or do anything that needs mental alertness until you know how this medicine affects you. Do not sit or stand up quickly, especially if you are an older patient. This reduces the risk of dizzy or fainting spells. Contact your doctor if these symptoms continue. Alcohol may interfere with the effect of this medicine. Avoid alcoholic drinks. What side effects may I notice from receiving this medicine? Side effects that you should report to your doctor or health care professional as soon as possible: -allergic reactions like skin rash, itching or hives -cold or numb hands or feet -depression -difficulty breathing -faint -fever with sore throat -irregular heartbeat, chest pain -rapid weight gain -swollen legs or ankles Side effects that usually do not require medical attention (report to your doctor or health care professional if they continue or are bothersome): -anxiety or nervousness -change in sex drive or performance -dry skin -headache -nightmares or trouble sleeping -short term memory loss -stomach upset or diarrhea -unusually tired This list may not describe all possible side effects. Call your doctor for medical advice about side effects. You may report side effects to FDA at 1-800-FDA-1088. Where should I keep my medicine? Keep out of the reach of children. Store at room temperature between 15 and 30 degrees C (59 and 86 degrees F). Throw away any unused medicine after the expiration date. NOTE: This sheet is a summary. It may not cover all possible information. If you have questions about this medicine, talk to your doctor, pharmacist, or health care provider.  2018 Elsevier/Gold Standard (2013-04-03 14:40:36)

## 2018-01-20 NOTE — Telephone Encounter (Signed)
Sabrina [name] with [facility] called to report a critical result for the pt  K 2.7, CACIUM 6.6. Dr. Curt Bears advised.

## 2018-01-21 ENCOUNTER — Telehealth: Payer: Self-pay

## 2018-01-21 DIAGNOSIS — E876 Hypokalemia: Secondary | ICD-10-CM

## 2018-01-21 NOTE — Telephone Encounter (Signed)
Spoke with the patient this morning to advise her that her K level was very low and advised her per Dr. Curt Bears to take 30 meq (3) of her Kdur (which she says she has in 94meq) twice a day for 3 days and Calcium 500mg  twice a day. Told pt that Dr. Curt Bears would like for her to return in 3 days for EKG and BMET. Pt says that she cannot come in this Friday since she will be in [place] for a funeral. Advised her that I will speak with MD and let her know when she needs to return if not on Friday.

## 2018-01-21 NOTE — Telephone Encounter (Signed)
Pt agrees to be seen in [place] for her EKG and Bmet Thursday afternoon. I called and got the okay from the [place] office. Pt will be there at 2:00pm even though I could only put it in for 4:00pm. She has started the added K this morning. Will call if she has any problems. Says she is currently felling well.

## 2018-01-23 ENCOUNTER — Other Ambulatory Visit (HOSPITAL_COMMUNITY)
Admission: RE | Admit: 2018-01-23 | Discharge: 2018-01-23 | Disposition: A | Discharge status: Home / Self Care | Payer: 59 | Source: Ambulatory Visit | Attending: Cardiology | Admitting: Cardiology

## 2018-01-23 ENCOUNTER — Ambulatory Visit (INDEPENDENT_AMBULATORY_CARE_PROVIDER_SITE_OTHER): Payer: 59 | Admitting: *Deleted

## 2018-01-23 DIAGNOSIS — E876 Hypokalemia: Secondary | ICD-10-CM

## 2018-01-23 DIAGNOSIS — I4581 Long QT syndrome: Secondary | ICD-10-CM

## 2018-01-23 LAB — BASIC METABOLIC PANEL
ANION GAP: 10 (ref 5–15)
BUN: 26 mg/dL — AB (ref 6–20)
CHLORIDE: 106 mmol/L (ref 101–111)
CO2: 27 mmol/L (ref 22–32)
Calcium: 7.1 mg/dL — ABNORMAL LOW (ref 8.9–10.3)
Creatinine, Ser: 1.32 mg/dL — ABNORMAL HIGH (ref 0.44–1.00)
GFR calc Af Amer: 52 mL/min — ABNORMAL LOW (ref >60)
GFR calc non Af Amer: 45 mL/min — ABNORMAL LOW (ref >60)
Glucose, Bld: 84 mg/dL (ref 65–99)
POTASSIUM: 4.2 mmol/L (ref 3.5–5.1)
SODIUM: 143 mmol/L (ref 135–145)

## 2018-01-23 NOTE — Progress Notes (Signed)
Presents to office today for EKG per Dr. Curt Bears and hypokalemia. Patient has taken all doses of medications as prescribed without side effects. No c/o pain, dizziness or sob.

## 2018-01-24 ENCOUNTER — Encounter: Payer: Self-pay | Admitting: "Endocrinology

## 2018-01-24 ENCOUNTER — Ambulatory Visit (INDEPENDENT_AMBULATORY_CARE_PROVIDER_SITE_OTHER): Payer: 59 | Admitting: "Endocrinology

## 2018-01-24 DIAGNOSIS — E039 Hypothyroidism, unspecified: Secondary | ICD-10-CM

## 2018-01-24 MED ORDER — LEVOTHYROXINE SODIUM 50 MCG PO TABS: 50.0000 ug | ORAL_TABLET | Freq: Every day | ORAL | 3 refills | Status: DC

## 2018-01-24 NOTE — Progress Notes (Signed)
Subjective:    Patient ID: Sabrina [name], female    DOB: 03-28-1961, PCP Fayrene Helper, MD   Past Medical History:  Diagnosis Date  . Allergy   . Anemia   . Arthritis   . Asthma   . Chronic bronchitis ([place])   . Connective tissue disease ([place])   . Diabetes mellitus   . Endometriosis   . Fibroid uterus   . GERD (gastroesophageal reflux disease)   . History of Doppler ultrasound    a. Carotid US 8/08:Estimated stenosis in the right and left internal carotid arteries is 0-50% and 0-50%.  . History of echocardiogram    a. Echo 6/16: EF 60-65%, no RWMA.  . History of hiatal hernia   . History of nuclear stress test    a. Myoview 6/16: There is no resting or stress perfusion defect consistent with no prior infarct and no ischemia. EF 73 %  The patient was hypertensive prior and during the study. Cardiac cath 04/2016 showed normal coronary arteries  . Hx of iron deficiency anemia   . Hypersomnia with sleep apnea   . Hypertension   . Lupus ([place]) 07/2013   Of Skin  . Menorrhagia   . Obesity   . Pneumonia   . 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 Secondary hyperthyroidism   . Thyroid disease   . Tinnitus    left ear  . Tubular adenoma of colon   . Vertigo   . Vitamin D deficiency    Past Surgical History:  Procedure Laterality Date  . ABDOMINAL HYSTERECTOMY  2004   supracervical hysterectomy  . BREAST SURGERY  2005   reduction  . BREATH TEK H PYLORI [name] 12/24/2014   Procedure: BREATH TEK H PYLORI;  Surgeon: Greer Pickerel, MD;  Location: [facility];  Service: General;  Laterality: [name];  . CARDIAC CATHETERIZATION [name] 03/22/2016   Procedure: Right/Left Heart Cath and Coronary Angiography;  Surgeon: Troy Sine, MD;  Location: [facility];  Service: Cardiovascular;  Laterality: [name];  . DILATION AND CURETTAGE OF UTERUS     age 57  . ESOPHAGOGASTRODUODENOSCOPY (EGD) WITH PROPOFOL [name] 10/14/2015   Procedure: ESOPHAGOGASTRODUODENOSCOPY (EGD) WITH PROPOFOL;   Surgeon: Clarene Essex, MD;  Location: [facility];  Service: Endoscopy;  Laterality: [name];  . LAPAROSCOPIC GASTRIC SLEEVE RESECTION WITH HIATAL HERNIA REPAIR [name] 07/05/2015   Procedure: LAPAROSCOPIC GASTRIC SLEEVE RESECTION WITH HIATAL HERNIA REPAIR, upper endoscopy;  Surgeon: Greer Pickerel, MD;  Location: [facility];  Service: General;  Laterality: [name];  . REDUCTION MAMMAPLASTY Bilateral    Social History   Socioeconomic History  . Marital status: Married    Spouse name: Not on file  . Number of children: Not on file  . Years of education: 41  . Highest education level: Not on file  Occupational History  . Occupation: Therapist, music: Radio broadcast assistant  Social Needs  . Financial resource strain: Not on file  . Food insecurity:    Worry: Not on file    Inability: Not on file  . Transportation needs:    Medical: Not on file    Non-medical: Not on file  Tobacco Use  . Smoking status: Never Smoker  . Smokeless tobacco: Never Used  Substance and Sexual Activity  . Alcohol use: No    Alcohol/week: 0.0 oz  . Drug use: No  . Sexual activity: Yes    Partners: Male    Birth control/protection: Surgical    Comment:  hysterectomy  Lifestyle  . Physical activity:    Days per week: Not on file    Minutes per session: Not on file  . Stress: Not on file  Relationships  . Social connections:    Talks on phone: Not on file    Gets together: Not on file    Attends religious service: Not on file    Active member of club or organization: Not on file    Attends meetings of clubs or organizations: Not on file    Relationship status: Not on file  Other Topics Concern  . Not on file  Social History Narrative   Regular exercise-no   Caffeine Use-no      [facility]: Patient lives with her husband. Works for [employer]. No bird or mold exposure. No pets currently.         Outpatient Encounter Medications as of 01/24/2018  Medication Sig  . Calcium  Citrate-Vitamin D (CALCIUM CITRATE + D PO) Take 500 mg by mouth 2 (two) times daily with a meal. Vit D 800 with magnesium  . acetaminophen (TYLENOL) 500 MG tablet Take 500 mg by mouth every 6 (six) hours as needed for headache.  [name] aspirin EC 81 MG tablet Take 1 tablet (81 mg total) by mouth daily.  [name] azelastine (ASTELIN) 0.1 % nasal spray Place 2 sprays into both nostrils 2 (two) times daily. Use in each nostril as directed  . ergocalciferol (VITAMIN D2) 50000 units capsule Take 1 capsule (50,000 Units total) by mouth once a week. One capsule once weekly  . furosemide (LASIX) 20 MG tablet Take 3 tablets (60 mg total) by mouth daily.  [name] LETAIRIS 5 MG tablet TAKE 1 TABLET BY MOUTH EVERY DAY  . levothyroxine (SYNTHROID, LEVOTHROID) 50 MCG tablet Take 1 tablet (50 mcg total) by mouth daily.  . metoprolol tartrate (LOPRESSOR) 25 MG tablet Take 0.5 tablets (12.5 mg total) by mouth 2 (two) times daily.  . Multiple Vitamin (MULTIVITAMIN WITH MINERALS) TABS tablet Take 1 tablet by mouth daily.  . mycophenolate (CELLCEPT) 500 MG tablet   . nitroGLYCERIN (NITROSTAT) 0.4 MG SL tablet Place 0.4 mg under the tongue every 5 (five) minutes as needed for chest pain (3 DOSES MAX).  [name] pantoprazole (PROTONIX) 40 MG tablet TAKE 1 TABLET BY MOUTH EVERY DAY  . potassium chloride (K-DUR) 10 MEQ tablet Take 1 tablet (10 mEq total) by mouth 2 (two) times daily.  . temazepam (RESTORIL) 15 MG capsule TAKE ONE CAPSULE BY MOUTH AT BEDTIME AS NEEDED FOR SLEEP  . triamcinolone ointment (KENALOG) 0.1 % APPLY AS DIRECTED TO ITCHING SPOTS ON THE BODY DAILY TO TWICE DAILY AS NEEDED. NOT TO FACE.   No facility-administered encounter medications on file as of 01/24/2018.    ALLERGIES: Allergies  Allergen Reactions  . Peanut-Containing Drug Products Nausea And Vomiting  . Potassium Iodide Other (See Comments)    facial swelling, also eyes, and ears  . Ace Inhibitors Cough  . Codeine Nausea And Vomiting  . Doxycycline Hives  .  Peanut Oil Swelling  . Amlodipine Rash  . Latex Hives and Rash  . Penicillins Hives, Itching and Rash    .Marland KitchenHas patient had a PCN reaction causing immediate rash, facial/tongue/throat swelling, SOB or lightheadedness with hypotension: No Has patient had a PCN reaction causing severe rash involving mucus membranes or skin necrosis: No Has patient had a PCN reaction that required hospitalization No Has patient had a PCN reaction occurring within the last 10 years:  YES (3 years ago)  If all of the above answers are "NO", then may proceed with Cephalosporin use.     VACCINATION STATUS: Immunization History  Administered Date(s) Administered  . Influenza Split 06/22/2011  . Influenza Whole 07/11/2006  . Influenza,inj,Quad PF,6+ Mos 06/26/2013, 05/11/2014, 06/13/2015, 05/20/2017  . Influenza-Unspecified 06/06/2016  . Pneumococcal Conjugate-13 02/07/2015  . Pneumococcal Polysaccharide-23 06/26/2013  . Td 04/11/2004  . Tdap 05/09/2015    HPI   57 year old female patient with medical history as above. -She was seen last year consult for subclinical hypothyroidism and hypocalcemia.  Hypocalcemia was related to hypoalbuminemia.  At that time she did not require intervention due to the fact that her corrected calcium was 8.1 mg per DL. -She was supposed to return in June 2018 with repeat labs, however she did not show up for follow-up.   -Recently on Dec 23, 2017 she was found to have hypocalcemia of 6.8 mg/dL which triggered initiation of supplement with calcium, currently taking 500 mg of elemental calcium daily along with vitamin D initiated by her PMD.    She has no new complaints today.   -She is known to have hypocalcemia for at least 5 years however etiology not clear. She denies any parathyroid surgery nor any prior history of parathyroid dysfunction. She denies any family history of parathyroid, thyroid, adrenal, pituitary dysfunction.  -She has never required thyroid hormone  replacement, her most recent TSH values are slowly increasing to current level of 7.89.   -Her labs on Dec 23, 2017 showed elevated PTH of 148, indicating secondary hyperparathyroidism due to her chronic hypocalcemia.  -She has history of multinodular goiter with biopsy of one of her nodules consistent with hyperblastic nodule.  Repeat thyroid ultrasound on March 13, 2017 was remarkable for another left lobe nodule 1.5 cm with benign features. -She denies dysphagia, shortness of breath, voice change. - She has a history of prolonged QT interval.    Review of Systems  Constitutional: + Gaining weight,  no fatigue, no subjective hyperthermia, no subjective hypothermia Eyes: no blurry vision, no xerophthalmia ENT: no sore throat, no nodules palpated in throat, no dysphagia, no odynophagia.   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  Neurological: no tremors, no numbness, no tingling, no dizziness Psychiatric: no depression, no anxiety  Objective:    BP 122/70   Pulse 74   Ht 5' (1.524 m)   Wt 209 lb (94.8 kg)   LMP 10/31/2002   BMI 40.82 kg/m   Wt Readings from Last 3 Encounters:  01/24/18 209 lb (94.8 kg)  01/20/18 197 lb 12.8 oz (89.7 kg)  12/19/17 197 lb (89.4 kg)    Physical Exam  Constitutional: + Obese, not in acute distress, normal state of mind.  Eyes: PERRLA, EOMI, no exophthalmos ENT: moist mucous membranes, + she has tight neck making it difficult to palpate for the thyroid, no cervical lymphadenopathy Musculoskeletal: no gross deformities, strength intact in all four extremities Skin: moist, warm, diffuse rash from lupus and eczema. Neurological: no tremor with outstretched hands, Deep tendon reflexes normal in all four extremities.  CMP ( most recent) CMP      Component Value Date/Time   NA 143 01/23/2018 1410   NA 143 01/20/2018 0852   K 4.2 01/23/2018 1410   CL 106 01/23/2018 1410   CO2 27 01/23/2018 1410   GLUCOSE  84 01/23/2018 1410   BUN 26 (H) 01/23/2018 1410   BUN 16 01/20/2018 0852   CREATININE 1.32 (H) 01/23/2018 1410   CREATININE 1.14 (H) 06/24/2017 1606   CALCIUM 7.1 (L) 01/23/2018 1410   CALCIUM 7.8 (L) 12/02/2008 2239   PROT 7.5 03/08/2017 1137   ALBUMIN 3.9 03/08/2017 1137   AST 12 03/08/2017 1137   ALT 6 03/08/2017 1137   ALKPHOS 35 (L) 03/08/2017 1137   BILITOT 0.4 03/08/2017 1137   GFRNONAA 45 (L) 01/23/2018 1410   GFRNONAA 55 (L) 06/24/2017 1606   GFRAA 52 (L) 01/23/2018 1410   GFRAA 64 06/24/2017 1606     Diabetic Labs (most recent): Lab Results  Component Value Date   HGBA1C 5.7 (H) 12/23/2017   HGBA1C 5.3 12/11/2016   HGBA1C 6.3 (H) 03/10/2016     Lipid Panel ( most recent) Lipid Panel     Component Value Date/Time   CHOL 166 12/23/2017 0738   TRIG 109 12/23/2017 0738   HDL 35 (L) 12/23/2017 0738   CHOLHDL 4.7 12/23/2017 0738   VLDL 18 12/11/2016 0743   LDLCALC 109 (H) 12/23/2017 0738     Assessment & Plan:   1. 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  Not due to hypoparathyroidism.  She has elevated PTH in response to hypocalcemia.  -Her most recent calcium was 7.1 on January 23, 2018 on 500 mg of daily calcium supplement.  -Advised her to increase her calcium supplement to 1000 mg a day of elemental calcium.  She takes a combination of calcium, vitamin D, and magnesium from over-the-counter sources.   2. hypothyroidism -Her work-up indicates hypothyroidism.  She would benefit from early initiation of thyroid hormone supplements.   -I discussed and initiated levothyroxine 50 mcg p.o. every morning.    - We discussed about correct intake of levothyroxine, at fasting, with water, separated by at least 30 minutes from breakfast, and separated by more  than 4 hours from calcium, iron, multivitamins, acid reflux medications (PPIs). -Patient is made aware of the fact that thyroid hormone replacement is needed for life, dose to be adjusted by periodic monitoring of thyroid function tests.  -Her thyroid ultrasound from August 2018 was remarkable for stable isthmus nodule at 1.8 cm and new 1.5 cm nodule on the left lobe with benign and non- suspicious features.   -She will not require intervention at this time.  - I advised patient to maintain close follow up with Fayrene Helper, MD for primary care needs. Follow up plan: Return in about 3 months (around 04/26/2018) for follow up with pre-visit labs.  Glade Lloyd, MD Phone: [phone]  Fax: [phone]   01/24/2018, 10:45 AM

## 2018-01-27 ENCOUNTER — Ambulatory Visit: Payer: 59 | Admitting: "Endocrinology

## 2018-01-28 ENCOUNTER — Encounter: Payer: Self-pay | Admitting: Cardiology

## 2018-01-28 ENCOUNTER — Telehealth: Payer: Self-pay | Admitting: Emergency Medicine

## 2018-01-28 NOTE — Telephone Encounter (Signed)
Left message w/ husband to call me back.

## 2018-01-28 NOTE — Telephone Encounter (Signed)
Call [facility] and spoke with Sharyn Lull Pharmacist who states pt's insurance will only take generic form of the Letairis which would be ambrisentan and still be 5mg   If provider wants pt to be on the brand, a PA might need to be done.  If a generic is to be dispensed pt and provider will need to be enrolled in [facility] ambrisentan [facility] Portal.  A website that can be used to enroll is [email or web address]  A phone number with the enrollment is [phone].  They need to know if the enrollment wants to be done or if the provider wants to keep pt on the brand name of Letairis and do a PA if it needs to be done at the time of pt needing a new refill of med.  Dr. Lamonte Sakai, please advise on this. Thanks!

## 2018-01-28 NOTE — Telephone Encounter (Signed)
New message    Have you gotten the results for the tests she had done?

## 2018-01-30 NOTE — Telephone Encounter (Signed)
Sabrina [name] from [facility] is calling [phone] Option#2   The pt and the Dr needs to be enrolled.  The number for being enrolled is   [phone]

## 2018-01-30 NOTE — Telephone Encounter (Signed)
Forms have been printed out. I will fill forms to the best of my knowledge. Also left a message for the patient to call back because if she is interested in the generic, she will need to sign the patient portion.   Will hold both forms in triage (under whiteboard).

## 2018-01-30 NOTE — Telephone Encounter (Signed)
It;s ok with me to initiate the process to obtain the generic version. As long as the patient agrees.

## 2018-01-31 ENCOUNTER — Other Ambulatory Visit: Payer: Self-pay | Admitting: *Deleted

## 2018-01-31 ENCOUNTER — Telehealth: Payer: Self-pay | Admitting: Cardiology

## 2018-01-31 DIAGNOSIS — R790 Abnormal level of blood mineral: Secondary | ICD-10-CM

## 2018-01-31 DIAGNOSIS — Z79899 Other long term (current) drug therapy: Secondary | ICD-10-CM

## 2018-01-31 NOTE — Telephone Encounter (Signed)
Advised pt, per Dr. Curt Bears, to stop Metoprolol.  Informed pt that cough should not be r/t Metoprolol and to f/u w/ PCP if cough persists. Patient verbalized understanding and agreeable to plan.

## 2018-01-31 NOTE — Telephone Encounter (Signed)
This encounter was created in error - please disregard.

## 2018-01-31 NOTE — Telephone Encounter (Signed)
New Message     Pt c/o medication issue:  1. Name of Medication: metoprolol tartrate (LOPRESSOR) 25 MG tablet  2. How are you currently taking this medication (dosage and times per day)?   3. Are you having a reaction (difficulty breathing--STAT)? Developed a cough and her breathing a little worse   4. What is your medication issue? Patient states that her breathing has not improved may have gotten worse and she has a dry cough

## 2018-02-03 NOTE — Telephone Encounter (Signed)
LMTCB x2  

## 2018-02-04 ENCOUNTER — Encounter: Payer: Self-pay | Admitting: Internal Medicine

## 2018-02-04 ENCOUNTER — Ambulatory Visit (INDEPENDENT_AMBULATORY_CARE_PROVIDER_SITE_OTHER): Payer: 59 | Admitting: Internal Medicine

## 2018-02-04 ENCOUNTER — Ambulatory Visit (INDEPENDENT_AMBULATORY_CARE_PROVIDER_SITE_OTHER)
Admission: RE | Admit: 2018-02-04 | Discharge: 2018-02-04 | Disposition: A | Discharge status: Home / Self Care | Payer: 59 | Source: Ambulatory Visit | Attending: Internal Medicine | Admitting: Internal Medicine

## 2018-02-04 VITALS — BP 154/90 | HR 62 | Temp 98.3°F | Ht 60.0 in | Wt 200.0 lb

## 2018-02-04 DIAGNOSIS — R05 Cough: Secondary | ICD-10-CM

## 2018-02-04 DIAGNOSIS — I272 Pulmonary hypertension, unspecified: Secondary | ICD-10-CM

## 2018-02-04 DIAGNOSIS — R058 Other specified cough: Secondary | ICD-10-CM

## 2018-02-04 DIAGNOSIS — I10 Essential (primary) hypertension: Secondary | ICD-10-CM

## 2018-02-04 HISTORY — DX: Other specified cough: R05.8

## 2018-02-04 MED ORDER — PREDNISONE 10 MG PO TABS: ORAL_TABLET | ORAL | 0 refills | Status: DC

## 2018-02-04 MED ORDER — MEPERIDINE HCL 50 MG PO TABS: 50.0000 mg | ORAL_TABLET | ORAL | 0 refills | Status: DC | PRN

## 2018-02-04 MED ORDER — PANTOPRAZOLE SODIUM 40 MG PO TBEC
40.0000 mg | DELAYED_RELEASE_TABLET | Freq: Every day | ORAL | 2 refills | Status: DC
Start: 2018-02-04 — End: 2018-02-04

## 2018-02-04 MED ORDER — PANTOPRAZOLE SODIUM 40 MG PO TBEC: DELAYED_RELEASE_TABLET | ORAL | 2 refills | Status: DC

## 2018-02-04 NOTE — Progress Notes (Signed)
Subjective:    Patient ID: Sabrina [name], female    DOB: 1961/03/09, 57 y.o.   MRN: [id number]  HPI [name] is a 57 year old never smoker with a history of diastolic CHF, systemic hypertension, diabetes, obesity with ?? obstructive sleep apnea. She had a [facility] before her gastric sgy. With a history of possible Lupus followed at [facility] and locally by Dr Tonia Brooms. She was seen by Rheum and it sounds like she was serology negative. Underwent gastric sleeve for wt loss (2016) by Dr Redmond Pulling, well tolerated. She took Phen-Fen for about 6 months  Former MTX use             ROV 11/21/17  Dr Lonie Peak. 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  She is on Plaquenil, CellCept.  She is been on methotrexate in the past remotely.  She is currently managed on Letairis.  She did not tolerate either Adcirca or Revatio.  She had a slight increase in her lower extremity edema and also in her fatigue, dyspnea.    spirometry consistent with restriction, restriction confirmed on lung volumes, decreased diffusion capacity that corrects to the normal range when adjusted for her alveolar volume.    She reports that she also had a high-resolution CT scan of her chest done at [facility] 11/13/17.  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                                                                                                                     [facility] 03/22/16  The left ventricular systolic function is normal.  The left ventricular ejection fraction is 55-65% by visual estimate.  Hemodynamic findings  consistent with severe pulmonary hypertension.  PASP 114 mmHg Severe right ventricular systolic pressure and pulmonary hypertension with systolic pressures greater than 100 mmHg. Significant pulmonary capillary/LV gradient, without previous evidence of mitral stenosis on echocardiography. Normal coronary arteries. Normal LV systolic function with left ventricular hypertrophy. RECOMMENDATION: The patient will undergo 2-D echo Doppler study later today prior to being discharged. The etiology of her severe pulmonary hypertension is unknown and consider possible primary pulmonary hypertension. Echo Doppler study will be helpful to further assess her mitral valve and potential for RV volume/pressure overload   TTE 09/14/16 >> good LV function, mildly dilated right ventricle, peak PA pressure 36 mm Hg TTE 09/10/17 >> intact left ventricular function, mildly dilated right ventricle, mildly reduced right ventricular function, estimated PASP 52 mmHg     Started on metaprolol 01/20/18 per Cards > stopped 01/31/18 due to cough while on metaprolol  Last dose of letreris 01/31/18    02/04/2018  f/u ov/Wert re:  [name] [name]  Chief  Complaint  Patient presents with  . Acute Visit    Pt c/o increased SOB and non prod cough x 2 wks. She states she is getting winded just walking accross the room.    Dyspnea:  Across the room sob    Cough: onset p 01/20/18 p was metaprolol was started but not better since d/c/ dry day > noct  Had been on zyrtec  Sleeping: difficult initially on L side down but does get to sleep eventually and not typically awakened by cough  SABA use: none  02: no    Overt HB since changed ppi from ac daily due to starting synthroid ac   No obvious day to day or daytime variability or assoc excess/ purulent sputum or mucus plugs or hemoptysis or cp or chest tightness, subjective wheeze or overt sinus b symptoms.   Sleeping L side down  without nocturnal  or early am exacerbation  of  respiratory  c/o's or need for noct saba. Also denies any obvious fluctuation of symptoms with weather or environmental changes or other aggravating or alleviating factors except as outlined above   No unusual exposure hx or h/o childhood pna/ asthma or knowledge of premature birth.  Current Allergies, Complete Past Medical History, Past Surgical History, Family History, and Social History were reviewed in [employer] record.  ROS  The following are not active complaints unless bolded Hoarseness, sore throat, dysphagia, dental problems, itching, sneezing,  nasal congestion or discharge of excess mucus or purulent secretions, ear ache,   fever, chills, sweats, unintended wt loss or wt gain, classically pleuritic or exertional cp,  orthopnea pnd or arm/hand swelling  or leg swelling, presyncope, palpitations, abdominal pain, anorexia, nausea, vomiting, diarrhea  or change in bowel habits or change in bladder habits, change in stools or change in urine, dysuria, hematuria,  rash, arthralgias, visual complaints, headache, numbness, weakness or ataxia or problems with walking or coordination,  change in mood or  memory.        Current Meds  Medication Sig  . acetaminophen (TYLENOL) 500 MG tablet Take 500 mg by mouth every 6 (six) hours as needed for headache.  [name] aspirin EC 81 MG tablet Take 1 tablet (81 mg total) by mouth daily.  . Calcium Citrate-Vitamin D (CALCIUM CITRATE + D PO) Take 500 mg by mouth 2 (two) times daily with a meal. Vit D 800 with magnesium  . ergocalciferol (VITAMIN D2) 50000 units capsule Take 1 capsule (50,000 Units total) by mouth once a week. One capsule once weekly  . furosemide (LASIX) 40 MG tablet Take 40 mg by mouth daily.  [name] levothyroxine (SYNTHROID, LEVOTHROID) 50 MCG tablet Take 1 tablet (50 mcg total) by mouth daily.  . Multiple Vitamin (MULTIVITAMIN WITH MINERALS) TABS tablet Take 1 tablet by mouth daily.  . mycophenolate (CELLCEPT) 500 MG tablet    . nitroGLYCERIN (NITROSTAT) 0.4 MG SL tablet Place 0.4 mg under the tongue every 5 (five) minutes as needed for chest pain (3 DOSES MAX).  [name] potassium chloride SA (K-DUR,KLOR-CON) 20 MEQ tablet Take 20 mEq by mouth 2 (two) times daily.  . temazepam (RESTORIL) 15 MG capsule TAKE ONE CAPSULE BY MOUTH AT BEDTIME AS NEEDED FOR SLEEP  . triamcinolone ointment (KENALOG) 0.1 % APPLY AS DIRECTED TO ITCHING SPOTS ON THE BODY DAILY TO TWICE DAILY AS NEEDED. NOT TO FACE.  . [DISCONTINUED] pantoprazole (PROTONIX) 40 MG tablet TAKE 1 TABLET BY MOUTH EVERY DAY  Objective:   Physical Exam   amb somwhat anxious but pleasant amb bf nad   Wt Readings from Last 3 Encounters:  02/04/18 200 lb (90.7 kg)  01/24/18 209 lb (94.8 kg)  01/20/18 197 lb 12.8 oz (89.7 kg)     Vital signs reviewed - Note on arrival 02 sats  100% on RA and bp 154/90 but pulse only 62      HEENT: nl dentition, turbinates bilaterally, and oropharynx. Nl external ear canals without cough reflex   NECK :  without JVD/Nodes/TM/ nl carotid upstrokes bilaterally   LUNGS: no acc muscle use,  Nl contour chest which is clear to A and P bilaterally without cough on insp or exp maneuvers   CV:  RRR  no s3 or murmur - def increase P2 , and no significant edema   ABD:  soft and nontender with nl inspiratory excursion in the supine position. No bruits or organomegaly appreciated, bowel sounds nl  MS:  Nl gait/ ext warm without deformities, calf tenderness, cyanosis or clubbing No obvious joint restrictions   SKIN: warm and dry without lesions    NEURO:  alert, approp, nl sensorium with  no motor or cerebellar deficits apparent.     CXR PA and Lateral:   02/04/2018 :    I personally reviewed images and   impression as follows:   Reduced lung vol, no ILD or acute changes      Assessment & Plan:

## 2018-02-04 NOTE — Patient Instructions (Addendum)
Protonix 40 mg Take 30  min before your first and last meals of the day   GERD (REFLUX)  is an extremely common cause of respiratory symptoms just like yours , many times with no obvious heartburn at all.    It can be treated with medication, but also with lifestyle changes including elevation of the head of your bed (ideally with 6 inch  bed blocks),  Smoking cessation, avoidance of late meals, excessive alcohol, and avoid fatty foods, chocolate, peppermint, colas, red wine, and acidic juices such as orange juice.  NO MINT OR MENTHOL PRODUCTS SO NO COUGH DROPS   USE SUGARLESS CANDY INSTEAD (Jolley ranchers or Stover's or Life Savers) or even ice chips will also do - the key is to swallow to prevent all throat clearing. NO OIL BASED VITAMINS - use powdered substitutes.    Prednisone 10 mg take  4 each am x 2 days,   2 each am x 2 days,  1 each am x 2 days and stop     Take delsym two tsp every 12 hours and supplement if needed with  Demerol 50 mg up to 1 every 4 hours to suppress the urge to cough. Swallowing water and/or using ice chips/non mint and menthol containing candies (such as lifesavers or sugarless jolly ranchers) are also effective.  You should rest your voice and avoid activities that you know make you cough.  Once you have eliminated the cough for 3 straight days try reducing the demerol first,  then the delsym as tolerated.     Please remember to go to the x-ray department downstairs in the basement  for your tests - we will call you with the results when they are available.

## 2018-02-04 NOTE — Telephone Encounter (Addendum)
Called and spoke with pt's husband regarding forms signed and completed in triage rm. Pt's husband states that pt is having increase SOB, chest pain & tightness in last two days. Offered a acute OV with MW today at 1:45pm; pt's husband agreed to appt Scheduled appt with MW today; RB has full schedule no available appt for today.  Routing message to [place] to follow up with pt's paperwork for [facility]. Pt's paperwork is placed in [place] cubby today.

## 2018-02-05 ENCOUNTER — Telehealth: Payer: Self-pay | Admitting: Emergency Medicine

## 2018-02-05 ENCOUNTER — Encounter: Payer: Self-pay | Admitting: Internal Medicine

## 2018-02-05 NOTE — Progress Notes (Signed)
Spoke with pt and notified of results per Dr. Wert. Pt verbalized understanding and denied any questions. 

## 2018-02-05 NOTE — Telephone Encounter (Signed)
Forms have been filled out, signed and faxed back.

## 2018-02-05 NOTE — Telephone Encounter (Signed)
Called [facility] and spoke with [place]. He has notified me of the information missing from these forms. Forms have been updated and faxed back. Nothing further was needed.

## 2018-02-05 NOTE — Assessment & Plan Note (Signed)
Did not tolerate metaprolol though not clear her cough was really related/ can't take amlopidpine and not vol overloaded so can't diures and relatively bradycardic so not a great candidate for cardizem > encourage f/u with cards as planned

## 2018-02-05 NOTE — Assessment & Plan Note (Addendum)
Of the three most common causes of  Sub-acute / recurrent or chronic cough, only one (GERD which she is felt to have at baseline )  can actually contribute to/ trigger  the other two (asthma and post nasal drip syndrome)  and perpetuate the cylce of cough.  While not intuitively obvious, many patients with chronic low grade reflux do not cough until there is a primary insult that disturbs the protective epithelial barrier and exposes sensitive nerve endings.   This is typically viral but can due to PNDS and  either may apply here.     The point is that once this occurs, it is difficult to eliminate the cycle  using anything but a maximally effective acid suppression regimen at least in the short run, accompanied by an appropriate diet to address non acid GERD and control / eliminate the cough itself for at least 3 days with demerol if needed since she is so intolerant of codeine and its derivatives.  Will give 6 days of prednisone for any acute inflammatory component as well.  I had an extended discussion with the patient reviewing all relevant studies completed to date and  lasting 25 minutes of a 40  minute acute office visit with pt new to me     re  severe non-specific but potentially very serious refractory respiratory symptoms of uncertain and potentially multiple  etiologies.   Each maintenance medication was reviewed in detail including most importantly the difference between maintenance and prns and under what circumstances the prns are to be triggered using an action plan format that is not reflected in the computer generated alphabetically organized AVS.    Please see AVS for specific instructions unique to this office visit that I personally wrote and verbalized to the the pt in detail and then reviewed with pt  by my nurse highlighting any changes in therapy/plan of care  recommended at today's visit.

## 2018-02-05 NOTE — Assessment & Plan Note (Signed)
Dr Lamonte Sakai attempting to secure her letairis - no evidence of decompensation at present though she is at risk given severity of dz > if condition worsens may need admit or refer back to [facility]

## 2018-02-14 ENCOUNTER — Other Ambulatory Visit: Payer: Self-pay | Admitting: Family Medicine

## 2018-02-14 ENCOUNTER — Ambulatory Visit: Payer: 59 | Admitting: Certified Nurse Midwife

## 2018-02-19 ENCOUNTER — Encounter: Payer: Self-pay | Admitting: Certified Nurse Midwife

## 2018-02-19 ENCOUNTER — Other Ambulatory Visit (HOSPITAL_COMMUNITY)
Admission: RE | Admit: 2018-02-19 | Discharge: 2018-02-19 | Disposition: A | Discharge status: Home / Self Care | Payer: 59 | Source: Ambulatory Visit | Attending: Certified Nurse Midwife | Admitting: Certified Nurse Midwife

## 2018-02-19 ENCOUNTER — Ambulatory Visit: Payer: 59 | Admitting: Certified Nurse Midwife

## 2018-02-19 ENCOUNTER — Other Ambulatory Visit: Payer: Self-pay

## 2018-02-19 VITALS — BP 118/80 | HR 64 | Resp 16 | Ht 59.75 in | Wt 207.0 lb

## 2018-02-19 DIAGNOSIS — Z01419 Encounter for gynecological examination (general) (routine) without abnormal findings: Secondary | ICD-10-CM | POA: Diagnosis not present

## 2018-02-19 DIAGNOSIS — M329 Systemic lupus erythematosus, unspecified: Secondary | ICD-10-CM

## 2018-02-19 DIAGNOSIS — Z124 Encounter for screening for malignant neoplasm of cervix: Secondary | ICD-10-CM

## 2018-02-19 DIAGNOSIS — Z8739 Personal history of other diseases of the musculoskeletal system and connective tissue: Secondary | ICD-10-CM | POA: Diagnosis not present

## 2018-02-19 DIAGNOSIS — N951 Menopausal and female climacteric states: Secondary | ICD-10-CM

## 2018-02-19 DIAGNOSIS — IMO0002 Reserved for concepts with insufficient information to code with codable children: Secondary | ICD-10-CM

## 2018-02-19 NOTE — Progress Notes (Signed)
57 yo  G0P0000 Married  African American Fe here for annual exam. Menopausal no HRT. Denies vaginal dryness or bleeding. Continues with [facility], Immunology with Lupus and connective tissue disease. Lower extremities. Struggling with all the new medications due to calcium and potassium depletion along with Hypothyroid now. Denies feeling any vaginal skin changes. All labs and follow up with [facility] frequently. Recent trip to [place] with spouse! No other health issues today.  Patient's last menstrual period was 10/31/2002.          Sexually active: Yes.    The current method of family planning is status post hysterectomy.   (supracervical) Exercising: No.  exercise Smoker:  No     Health Maintenance: Pap:  11-13-14 neg, 03-08-17 neg HPV HR neg History of Abnormal Pap: no MMG:  08-30-17 category c density birads 1:neg Self Breast exams: yes Colonoscopy:  12/14 polyps f/u 38yrs BMD:   2006 TDaP:  2016 Shingles: not done Pneumonia: 2016 Hep C and HIV: both neg 2016 Labs: if needed   reports that she has never smoked. She has never used smokeless tobacco. She reports that she does not drink alcohol or use drugs.  Past Medical History:  Diagnosis Date  . Allergy   . Anemia   . Arthritis   . Asthma   . Chronic bronchitis ([place])   . Connective tissue disease ([place])   . Diabetes mellitus   . Endometriosis   . Fibroid uterus   . GERD (gastroesophageal reflux disease)   . History of Doppler ultrasound    a. Carotid US 8/08:Estimated stenosis in the right and left internal carotid arteries is 0-50% and 0-50%.  . History of echocardiogram    a. Echo 6/16: EF 60-65%, no RWMA.  . History of hiatal hernia   . History of nuclear stress test    a. Myoview 6/16: There is no resting or stress perfusion defect consistent with no prior infarct and no ischemia. EF 73 %  The patient was hypertensive prior and during the study. Cardiac cath 04/2016 showed normal coronary arteries  . Hx of iron  deficiency anemia   . Hypersomnia with sleep apnea   . Hypertension   . Lupus ([place]) 07/2013   Of Skin  . Menorrhagia   . Obesity   . Pneumonia   . 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 Secondary hyperthyroidism   . Thyroid disease   . Tinnitus    left ear  . Tubular adenoma of colon   . Vertigo   . Vitamin D deficiency     Past Surgical History:  Procedure Laterality Date  . ABDOMINAL HYSTERECTOMY  2004   supracervical hysterectomy  . BREAST SURGERY  2005   reduction  . BREATH TEK H PYLORI [name] 12/24/2014   Procedure: BREATH TEK H PYLORI;  Surgeon: Greer Pickerel, MD;  Location: [facility];  Service: General;  Laterality: [name];  . CARDIAC CATHETERIZATION [name] 03/22/2016   Procedure: Right/Left Heart Cath and Coronary Angiography;  Surgeon: Troy Sine, MD;  Location: [facility];  Service: Cardiovascular;  Laterality: [name];  . DILATION AND CURETTAGE OF UTERUS     age 34  . ESOPHAGOGASTRODUODENOSCOPY (EGD) WITH PROPOFOL [name] 10/14/2015   Procedure: ESOPHAGOGASTRODUODENOSCOPY (EGD) WITH PROPOFOL;  Surgeon: Clarene Essex, MD;  Location: [facility];  Service: Endoscopy;  Laterality: [name];  . LAPAROSCOPIC GASTRIC SLEEVE RESECTION WITH HIATAL HERNIA REPAIR [name] 07/05/2015   Procedure: LAPAROSCOPIC GASTRIC SLEEVE RESECTION WITH HIATAL HERNIA REPAIR, upper endoscopy;  Surgeon: Greer Pickerel, MD;  Location: [facility];  Service: General;  Laterality: [name];  . REDUCTION MAMMAPLASTY Bilateral     Current Outpatient Medications  Medication Sig Dispense Refill  . acetaminophen (TYLENOL) 500 MG tablet Take 500 mg by mouth every 6 (six) hours as needed for headache.    [name] aspirin EC 81 MG tablet Take 1 tablet (81 mg total) by mouth daily.    . Calcium Citrate-Vitamin D (CALCIUM CITRATE + D PO) Take 500 mg by mouth 2 (two) times daily with a meal. Vit D 800 with magnesium    . ergocalciferol (VITAMIN D2) 50000 units capsule Take 1 capsule (50,000 Units total) by mouth once a week. One capsule once  weekly 12 capsule 1  . furosemide (LASIX) 40 MG tablet Take 40 mg by mouth daily.    [name] KLOR-CON 10 10 MEQ tablet TAKE 1 TABLET BY MOUTH TWICE A DAY 60 tablet 3  . levothyroxine (SYNTHROID, LEVOTHROID) 50 MCG tablet Take 1 tablet (50 mcg total) by mouth daily. 30 tablet 3  . meperidine (DEMEROL) 50 MG tablet Take 1 tablet (50 mg total) by mouth every 4 (four) hours as needed (cough). 30 tablet 0  . Multiple Vitamin (MULTIVITAMIN WITH MINERALS) TABS tablet Take 1 tablet by mouth daily.    . mycophenolate (CELLCEPT) 500 MG tablet     . nitroGLYCERIN (NITROSTAT) 0.4 MG SL tablet Place 0.4 mg under the tongue every 5 (five) minutes as needed for chest pain (3 DOSES MAX).    [name] pantoprazole (PROTONIX) 40 MG tablet Take 30- 60 min before your first and last meals of the day 60 tablet 2  . potassium chloride SA (K-DUR,KLOR-CON) 20 MEQ tablet Take 20 mEq by mouth 2 (two) times daily.    . predniSONE (DELTASONE) 10 MG tablet Take  4 each am x 2 days,   2 each am x 2 days,  1 each am x 2 days and stop 14 tablet 0  . temazepam (RESTORIL) 15 MG capsule TAKE ONE CAPSULE BY MOUTH AT BEDTIME AS NEEDED FOR SLEEP 30 capsule 0  . triamcinolone ointment (KENALOG) 0.1 % APPLY AS DIRECTED TO ITCHING SPOTS ON THE BODY DAILY TO TWICE DAILY AS NEEDED. NOT TO FACE.     No current facility-administered medications for this visit.     Family History  Problem Relation Age of Onset  . Hypertension Mother   . Diabetes Mother   . Arthritis Mother   . Depression Mother   . CAD Mother        s/p PCI  . Diabetes Father   . Stroke Father   . Arthritis Father   . Depression Father   . Hypertension Father   . Cancer Father        prostate  . Hypertension Sister        x 2  . Breast cancer Sister   . Arthritis Sister   . Bleeding Disorder Sister   . Depression Sister   . Thyroid disease Sister   . Colon cancer Maternal Grandfather   . Stroke Maternal Aunt   . Heart attack Neg Hx     ROS:  Pertinent items are  noted in HPI.  Otherwise, a comprehensive ROS was negative.  Exam:   LMP 10/31/2002    Ht Readings from Last 3 Encounters:  02/04/18 5' (1.524 m)  01/24/18 5' (1.524 m)  01/20/18 5' (1.524 m)    General appearance: alert, cooperative and appears stated age Head: Normocephalic,  without obvious abnormality, atraumatic Neck: no adenopathy, supple, symmetrical, trachea midline and thyroid normal to inspection and palpation Lungs: clear to auscultation bilaterally Breasts: normal appearance, no masses or tenderness, No nipple retraction or dimpling, No nipple discharge or bleeding, No axillary or supraclavicular adenopathy Heart: regular rate and rhythm Abdomen: soft, non-tender; no masses,  no organomegaly Extremities: extremities normal, atraumatic, no cyanosis or edema Skin: Skin color, texture, turgor normal on some parts of arms and face. Legs increase pigmentation and leather like feel noted. No rashes or lesions noted Lymph nodes: Cervical, supraclavicular, and axillary nodes normal. No abnormal inguinal nodes palpated Neurologic: Grossly normal   Pelvic: External genitalia:  no lesions              Urethra:  normal appearing urethra with no masses, tenderness or lesions              Bartholin's and Skene's: normal                 Vagina: normal appearing vagina with normal color and discharge, no lesions              Cervix: no bleeding following Pap, no lesions and normal appearance              Pap taken: Yes.   Bimanual Exam:  Uterus:  uterus absent              Adnexa: no mass, fullness, tenderness               Rectovaginal: Confirms               Anus:  normal sphincter tone, no lesions  Chaperone present: yes  A:  Well Woman with normal exam  Menopausal no HRT S/P TAH for fibroids supracervical. Ovaries remain  Lupus and Sclerderma/ with  MD management  History of chronic diastolic heart failure with cardiology management, stable per patient      P:   Reviewed  health and wellness pertinent to exam  Aware of need to advise if vaginal bleeding.  Discussed continued use of coconut oil as needed for vaginal dryness and skin dryness  Continue follow up with MD's regarding other health issues.  Pap smear: yes   counseled on breast self exam, mammography screening, feminine hygiene, menopause, adequate intake of calcium and vitamin D, diet and exercise  return annually or prn   An After Visit Summary was printed and given to the patient.

## 2018-02-19 NOTE — Patient Instructions (Signed)

## 2018-02-21 ENCOUNTER — Ambulatory Visit: Payer: 59 | Admitting: Certified Nurse Midwife

## 2018-02-21 LAB — CYTOLOGY - PAP
Diagnosis: NEGATIVE
HPV (WINDOPATH): NOT DETECTED

## 2018-03-17 ENCOUNTER — Other Ambulatory Visit: Payer: Self-pay

## 2018-03-17 ENCOUNTER — Encounter: Payer: Self-pay | Admitting: Family Medicine

## 2018-03-17 ENCOUNTER — Ambulatory Visit (INDEPENDENT_AMBULATORY_CARE_PROVIDER_SITE_OTHER): Payer: 59 | Admitting: Family Medicine

## 2018-03-17 VITALS — BP 114/66 | HR 84 | Resp 12 | Ht <= 58 in | Wt 200.1 lb

## 2018-03-17 DIAGNOSIS — K635 Polyp of colon: Secondary | ICD-10-CM

## 2018-03-17 DIAGNOSIS — D126 Benign neoplasm of colon, unspecified: Secondary | ICD-10-CM

## 2018-03-17 DIAGNOSIS — M79644 Pain in right finger(s): Secondary | ICD-10-CM

## 2018-03-17 DIAGNOSIS — L089 Local infection of the skin and subcutaneous tissue, unspecified: Secondary | ICD-10-CM

## 2018-03-17 DIAGNOSIS — E038 Other specified hypothyroidism: Secondary | ICD-10-CM

## 2018-03-17 DIAGNOSIS — S60459A Superficial foreign body of unspecified finger, initial encounter: Principal | ICD-10-CM

## 2018-03-17 DIAGNOSIS — K219 Gastro-esophageal reflux disease without esophagitis: Secondary | ICD-10-CM

## 2018-03-17 DIAGNOSIS — F5101 Primary insomnia: Secondary | ICD-10-CM

## 2018-03-17 DIAGNOSIS — J849 Interstitial pulmonary disease, unspecified: Secondary | ICD-10-CM

## 2018-03-17 MED ORDER — SULFAMETHOXAZOLE-TRIMETHOPRIM 800-160 MG PO TABS: 1.0000 | ORAL_TABLET | Freq: Two times a day (BID) | ORAL | 0 refills | Status: DC

## 2018-03-17 MED ORDER — TEMAZEPAM 15 MG PO CAPS: 15.0000 mg | ORAL_CAPSULE | Freq: Every evening | ORAL | 4 refills | Status: DC | PRN

## 2018-03-17 NOTE — Assessment & Plan Note (Signed)
Controlled, no change in medication  

## 2018-03-17 NOTE — Assessment & Plan Note (Signed)
Deteriorated. Patient re-educated about  the importance of commitment to a  minimum of 150 minutes of exercise per week.  The importance of healthy food choices with portion control discussed. Encouraged to start a food diary, count calories and to consider  joining a support group. Sample diet sheets offered. Goals set by the patient for the next several months.   Weight /BMI 03/17/2018 02/19/2018 02/04/2018  WEIGHT 200 lb 1.9 oz 207 lb 200 lb  HEIGHT 4\' 9"  4' 11.75" 5\' 0"   BMI 43.31 kg/m2 40.77 kg/m2 39.06 kg/m2  goal of 10 pound weight loss in 5 months

## 2018-03-17 NOTE — Assessment & Plan Note (Signed)
Repeat colonoscopy past due, pt aware and referral sent , she is to schedule

## 2018-03-17 NOTE — Assessment & Plan Note (Addendum)
Patient had splinter in right 5th finger 3 weeks ago. Since then pain and swelling of finger persists  Will rx antibiotic and refer  to orthopedics asap concern is that there is retained splinter

## 2018-03-17 NOTE — Assessment & Plan Note (Signed)
Currently stable and treated at [facility]

## 2018-03-17 NOTE — Assessment & Plan Note (Signed)
Sleep hygiene reviewed and written information offered also. Prescription sent for  medication needed.  

## 2018-03-17 NOTE — Patient Instructions (Addendum)
F/u in early January , call if you need me before  Please get colonoscopy this is past due , call and schedule, referral is entered , Dr Hilarie Fredrickson  Antibiotic is prescribed for your infected finger and we are trying to get you to orthopedics as soon as possible also as I have a concern for retained splinter . We will call your cell [phone] msg will be left  It is important that you exercise regularly at least 30 minutes 5 times a week. If you develop chest pain, have severe difficulty breathing, or feel very tired, stop exercising immediately and seek medical attention   Goal of 190

## 2018-03-17 NOTE — Assessment & Plan Note (Signed)
Managed by Dr Dorris Fetch

## 2018-03-17 NOTE — Progress Notes (Signed)
   VERNELLA NIZNIK     MRN: [id number]      DOB: [birth year]   HPI Sabrina [name] is here with a 3 week h/o painful swollen 5th fingero on the right hand which is not improving She had a splinter in the hand and removed it herself, unfortunately , comes in today stating that at night she experiences pain rated at a 10 and her finger is swollen and red  No regular exercise and weight is stationary.weight loss goal of 10 pounds in the next 4 months Denies shortness of breath, and is now being foplllowed by local endocrinologist  for her thyroid   Colonoscopy is past due she will schedule  ROS Denies recent fever or chills. Denies sinus pressure, nasal congestion, ear pain or sore throat. Denies chest congestion, productive cough or wheezing. Denies chest pains, palpitations and leg swelling Denies abdominal pain, nausea, vomiting,diarrhea or constipation.   Denies dysuria, frequency, hesitancy or incontinence.  Denies headaches, seizures, numbness, or tingling. Denies depression, anxiety   PE  BP 114/66 (BP Location: Left Arm, Patient Position: Sitting, Cuff Size: Large)   Pulse 84   Resp 12   Ht 4\' 9"  (1.448 m)   Wt 200 lb 1.9 oz (90.8 kg)   LMP 10/31/2002   SpO2 93% Comment: room air  BMI 43.31 kg/m   Patient alert and oriented and in no cardiopulmonary distress.  HEENT: No facial asymmetry, EOMI,   oropharynx pink and moist.  Neck supple no JVD, no mass.  Chest: Clear to auscultation bilaterally.  CVS: S1, S2 no murmurs, no S3.Regular rate.  ABD: Soft non tender.   Ext: No edema  MS: Adequate ROM spine, shoulders, hips and knees.  Skin: shallow ulcer with possible retained splinter in 5th right finger. 5th right finger is swollen , warm and tender Psych: Good eye contact, normal affect. Memory intact not anxious or depressed appearing.  CNS: CN 2-12 intact, power,  normal throughout.no focal deficits noted.   Assessment & Plan  Pain in finger of right  hand Patient had splinter in right 5th finger 3 weeks ago. Since then pain and swelling of finger persists  Will rx antibiotic and refer  to orthopedics asap concern is that there is retained splinter  Morbid obesity ([place]) Deteriorated. Patient re-educated about  the importance of commitment to a  minimum of 150 minutes of exercise per week.  The importance of healthy food choices with portion control discussed. Encouraged to start a food diary, count calories and to consider  joining a support group. Sample diet sheets offered. Goals set by the patient for the next several months.   Weight /BMI 03/17/2018 02/19/2018 02/04/2018  WEIGHT 200 lb 1.9 oz 207 lb 200 lb  HEIGHT 4\' 9"  4' 11.75" 5\' 0"   BMI 43.31 kg/m2 40.77 kg/m2 39.06 kg/m2  goal of 10 pound weight loss in 5 months    Tubular adenoma of colon Repeat colonoscopy past due, pt aware and referral sent , she is to schedule  Hypothyroidism Managed by Dr Dorris Fetch  GERD (gastroesophageal reflux disease) Controlled, no change in medication   Insomnia Sleep hygiene reviewed and written information offered also. Prescription sent for  medication needed.   ILD (interstitial lung disease) ([place]) Currently stable and treated at [facility]

## 2018-03-19 ENCOUNTER — Ambulatory Visit (INDEPENDENT_AMBULATORY_CARE_PROVIDER_SITE_OTHER): Payer: 59 | Admitting: Orthopaedic Surgery

## 2018-03-19 ENCOUNTER — Ambulatory Visit: Payer: 59 | Admitting: Orthopaedic Surgery

## 2018-03-19 ENCOUNTER — Encounter (INDEPENDENT_AMBULATORY_CARE_PROVIDER_SITE_OTHER): Payer: Self-pay | Admitting: Orthopaedic Surgery

## 2018-03-19 VITALS — BP 139/79 | HR 72 | Ht <= 58 in | Wt 200.0 lb

## 2018-03-19 DIAGNOSIS — S60459A Superficial foreign body of unspecified finger, initial encounter: Secondary | ICD-10-CM

## 2018-03-19 DIAGNOSIS — L089 Local infection of the skin and subcutaneous tissue, unspecified: Secondary | ICD-10-CM

## 2018-03-19 NOTE — Progress Notes (Signed)
Office Visit Note   Patient: Sabrina [name]           Date of Birth: Jul 02, 1961           MRN: [id number] Visit Date: 03/19/2018              Requested by: Fayrene Helper, [place], [place], [place] [address] PCP: Fayrene Helper, MD   Assessment & Plan: Visit Diagnoses:  1. Foreign body of finger with infection, initial encounter     Plan: Tuft infection right little finger secondary to a wooden splinter.  Presently taking Septra DS and  feeling better.  Will monitor progress over the next week.  Consider changing antibiotic to doxycycline Friday if not much improvement.  Return in 1 week and consider I&D if no improvement.  No evidence of a fluctuant mass or abscess  Follow-Up Instructions: Return in about 1 week (around 03/26/2018).   Orders:  No orders of the defined types were placed in this encounter.  No orders of the defined types were placed in this encounter.     Procedures: No procedures performed   Clinical Data: No additional findings.   Subjective: Chief Complaint  Patient presents with  . New Patient (Initial Visit)    splinter in right finger for 3 weeks possible infection. used epsom salt, alcohol and peroxide but never got better  This is Manger is 57 years old and accompanied by her husband and seen for evaluation of an infection to the tuft of the right little finger.  She noted a splinter in the tip of her finger several weeks ago.  Initially she had a lot of swelling of both the little and the ring finger but with "home treatment" this resolved.  She was seen by her primary care physician 2 days ago and placed on Septra DS.  She has some soreness at the tip of the finger where there is been no drainage.  HPI  Review of Systems  Constitutional: Positive for fatigue and fever.  HENT: Negative for ear pain.   Eyes: Negative for pain.  Respiratory: Positive for cough and shortness of breath.   Cardiovascular:  Positive for leg swelling.  Gastrointestinal: Negative for constipation and diarrhea.  Genitourinary: Negative for difficulty urinating.  Musculoskeletal: Negative for back pain and neck pain.  Skin: Positive for rash.  Allergic/Immunologic: Positive for food allergies.  Neurological: Positive for weakness. Negative for numbness.  Hematological: Bruises/bleeds easily.  Psychiatric/Behavioral: Negative for sleep disturbance.     Objective: Vital Signs: BP 139/79 (BP Location: Right Arm, Patient Position: Sitting, Cuff Size: Normal)   Pulse 72   Ht 4\' 10"  (1.473 m)   Wt 200 lb (90.7 kg)   LMP 10/31/2002   BMI 41.80 kg/m   Physical Exam  Constitutional: She is oriented to person, place, and time. She appears well-developed and well-nourished.  HENT:  Mouth/Throat: Oropharynx is clear and moist.  Eyes: Pupils are equal, round, and reactive to light. EOM are normal.  Pulmonary/Chest: Effort normal.  Neurological: She is alert and oriented to person, place, and time.  Skin: Skin is warm and dry.  Psychiatric: She has a normal mood and affect. Her behavior is normal.    Ortho Exam awake awake alert and oriented x3.  Comfortable sitting.  Very minimal redness at the tip of the right little finger.  There is a 2 mm scab at the very tip of the finger but without any drainage.  There is some soreness but no fluctuance.  The nail is intact.  No pain along the proximal or middle phalanx.  Able to make a full fist and release.  No evidence of an ascending lymphangitis.  No swelling of any of the adjacent fingers Specialty Comments:  No specialty comments available.  Imaging: No results found.   PMFS History: Patient Active Problem List   Diagnosis Date Noted  . Foreign body in finger-infected 03/19/2018  . Pain in finger of right hand 03/17/2018  . Upper airway cough syndrome 02/04/2018  . ILD (interstitial lung disease) ([place]) 11/21/2017  . Insomnia 05/20/2017  . Morbid obesity  ([place]) 05/20/2017  . Elevated TSH 12/16/2016  . Hypocalcemia 07/07/2016  . Hypokalemia 07/07/2016  . 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 Pulmonary hypertension ([place]) 04/06/2016  . Chronic diastolic heart failure ([place]) 04/06/2016  . Precordial pain   . Drug eruption 03/14/2016  . Psoriasis 10/18/2015  . GERD (gastroesophageal reflux disease) 07/05/2015  . 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 Scleroderma ([place]) 03/09/2015  . Metabolic syndrome X 73/42/8768  . Encounter for long-term (current) use of other medications 01/14/2015  . Seasonal allergies 01/05/2015  . Iron deficiency anemia 09/11/2014  . Tubular adenoma of colon 09/11/2014  . Neoplasm of uncertain behavior of skin 05/09/2012  . Chronic fatigue 10/26/2010  . Secondary hyperparathyroidism ([place]) 12/02/2008  . Vitamin D deficiency 10/10/2008  . Hypothyroidism 08/27/2007  . Essential hypertension 08/27/2007   Past Medical History:  Diagnosis Date  . Allergy   . Anemia   . Arthritis   . Asthma   . Chronic bronchitis ([place])   . Connective tissue disease ([place])   . Diabetes mellitus   . Endometriosis   . Fibroid uterus   . GERD (gastroesophageal reflux disease)   . History of Doppler ultrasound    a. Carotid US 8/08:Estimated stenosis in the right and left internal carotid arteries is 0-50% and 0-50%.  . History of echocardiogram    a. Echo 6/16: EF 60-65%, no RWMA.  . History of hiatal hernia   . History of nuclear stress test    a. Myoview 6/16: There is no resting or stress perfusion defect consistent with no prior infarct and no ischemia. EF 73 %  The patient was hypertensive prior and during the study. Cardiac cath 04/2016 showed normal coronary arteries  . Hx of iron deficiency anemia   . Hypersomnia with sleep apnea   . Hypertension   . Lupus ([place]) 07/2013   Of Skin  . Menorrhagia   . Obesity   . Pneumonia   . Prolonged QT syndrome  06/22/2016  . Secondary hyperthyroidism   . Thyroid disease   . Tinnitus    left ear  . Tubular adenoma of colon   . Vertigo   . Vitamin D deficiency     Family History  Problem Relation Age of Onset  . Hypertension Mother   . Diabetes Mother   . Arthritis Mother   . Depression Mother   . CAD Mother        s/p PCI  . Diabetes Father   . Stroke Father   . Arthritis Father   . Depression Father   . Hypertension Father   . Cancer Father        prostate  . Hypertension Sister        x 2  . Breast cancer Sister   . Arthritis Sister   .  Bleeding Disorder Sister   . Depression Sister   . Thyroid disease Sister   . Colon cancer Maternal Grandfather   . Stroke Maternal Aunt   . Heart attack Neg Hx     Past Surgical History:  Procedure Laterality Date  . ABDOMINAL HYSTERECTOMY  2004   supracervical hysterectomy  . BREAST SURGERY  2005   reduction  . BREATH TEK H PYLORI [name] 12/24/2014   Procedure: BREATH TEK H PYLORI;  Surgeon: Greer Pickerel, MD;  Location: [facility];  Service: General;  Laterality: [name];  . CARDIAC CATHETERIZATION [name] 03/22/2016   Procedure: Right/Left Heart Cath and Coronary Angiography;  Surgeon: Troy Sine, MD;  Location: [facility];  Service: Cardiovascular;  Laterality: [name];  . DILATION AND CURETTAGE OF UTERUS     age 32  . ESOPHAGOGASTRODUODENOSCOPY (EGD) WITH PROPOFOL [name] 10/14/2015   Procedure: ESOPHAGOGASTRODUODENOSCOPY (EGD) WITH PROPOFOL;  Surgeon: Clarene Essex, MD;  [facility];  Service: Endoscopy;  Laterality: [name];  . LAPAROSCOPIC GASTRIC SLEEVE RESECTION WITH HIATAL HERNIA REPAIR [name] 07/05/2015   Procedure: LAPAROSCOPIC GASTRIC SLEEVE RESECTION WITH HIATAL HERNIA REPAIR, upper endoscopy;  Surgeon: Greer Pickerel, MD;  Location: [facility];  Service: General;  Laterality: [name];  . REDUCTION MAMMAPLASTY Bilateral    Social History   Occupational History  . Occupation: Therapist, music: [employer]    Tobacco Use  . Smoking status: Never Smoker  . Smokeless tobacco: Never Used  Substance and Sexual Activity  . Alcohol use: No    Alcohol/week: 0.0 oz  . Drug use: No  . Sexual activity: Yes    Partners: Male    Birth control/protection: Surgical    Comment: hysterectomy (supracervical)

## 2018-03-26 ENCOUNTER — Encounter (INDEPENDENT_AMBULATORY_CARE_PROVIDER_SITE_OTHER): Payer: Self-pay | Admitting: Orthopaedic Surgery

## 2018-03-26 ENCOUNTER — Ambulatory Visit (INDEPENDENT_AMBULATORY_CARE_PROVIDER_SITE_OTHER): Payer: 59 | Admitting: Orthopaedic Surgery

## 2018-03-26 VITALS — BP 130/73 | HR 66 | Ht <= 58 in | Wt 200.0 lb

## 2018-03-26 DIAGNOSIS — S60459D Superficial foreign body of unspecified finger, subsequent encounter: Secondary | ICD-10-CM | POA: Diagnosis not present

## 2018-03-26 DIAGNOSIS — L089 Local infection of the skin and subcutaneous tissue, unspecified: Secondary | ICD-10-CM | POA: Diagnosis not present

## 2018-03-26 NOTE — Progress Notes (Signed)
Office Visit Note   Patient: Sabrina [name]           Date of Birth: [birth year]           MRN: [id number] Visit Date: 03/26/2018              Requested by: Fayrene Helper, [place], [place],  [id number] PCP: Fayrene Helper, MD   Assessment & Plan: Visit Diagnoses:  1. Foreign body of finger with infection, subsequent encounter     Plan: Tuft infection right little finger from foreign body.  Definitely improved from last week on Septra DS 1 tab p.o. twice daily.  I would like Dr. Lorin Mercy to look at Sabrina [name]'s finger tomorrow and decide whether she should just continue the antibiotics or consider an I&D.  Also obtain films in the morning.  Follow-Up Instructions: Return to see Dr Lorin Mercy in am.   Orders:  No orders of the defined types were placed in this encounter.  No orders of the defined types were placed in this encounter.     Procedures: No procedures performed   Clinical Data: No additional findings.   Subjective: Chief Complaint  Patient presents with  . Follow-up    PT STILL TAKING ANTIBIOTIC BUT R PINKY FINGER IS STILL NOT BETTER  Sabrina [name] was seen last week for an evaluation of what appeared to be a tuft several weeks prior to the LAD last week she had a splinter.  Swollen persistent pain at the tip of the little finger.  Her primary care physician started her on Septra DS 1 p.o. twice daily.  I suggested that she continue since that time she notes she still having some tenderness of the tip of the finger but the fingers are swollen and  HPI  Review of Systems  Constitutional: Negative for fatigue and fever.  HENT: Negative for ear pain.   Eyes: Negative for pain.  Respiratory: Negative for cough and shortness of breath.   Cardiovascular: Negative for leg swelling.  Gastrointestinal: Negative for constipation and diarrhea.  Genitourinary: Negative for difficulty urinating.  Musculoskeletal: Negative for back  pain and neck pain.  Skin: Negative for rash.  Allergic/Immunologic: Positive for food allergies.  Neurological: Positive for numbness. Negative for weakness.  Hematological: Bruises/bleeds easily.     Objective: Vital Signs: BP 130/73 (BP Location: Right Arm, Patient Position: Sitting, Cuff Size: Normal)   Pulse 66   Ht 4\' 10"  (1.473 m)   Wt 200 lb (90.7 kg)   LMP 10/31/2002   BMI 41.80 kg/m   Physical Exam  Constitutional: She is oriented to person, place, and time. She appears well-developed and well-nourished.  HENT:  Mouth/Throat: Oropharynx is clear and moist.  Eyes: Pupils are equal, round, and reactive to light. EOM are normal.  Pulmonary/Chest: Effort normal.  Neurological: She is alert and oriented to person, place, and time.  Skin: Skin is warm and dry.  Psychiatric: She has a normal mood and affect. Her behavior is normal.    Ortho Exam alert and oriented x3.  Comfortable sitting.  Examination of the right little finger revealed decreased swelling of the entire digit.  There is no redness about the middle or both phalanx.  There is still some tenderness and minimal swelling at the very tip of the finger.  There is a crusted area several millimeters in diameter at the very tip but no drainage.  There has never been any drainage.  Still little  tender  Specialty Comments:  No specialty comments available.  Imaging: No results found.   PMFS History: Patient Active Problem List   Diagnosis Date Noted  . Foreign body in finger-infected 03/19/2018  . Pain in finger of right hand 03/17/2018  . Upper airway cough syndrome 02/04/2018  . ILD (interstitial lung disease) ([place]) 11/21/2017  . Insomnia 05/20/2017  . Morbid obesity ([place]) 05/20/2017  . Elevated TSH 12/16/2016  . Hypocalcemia 07/07/2016  . Hypokalemia 07/07/2016  . 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 Pulmonary hypertension ([place]) 04/06/2016  .  Chronic diastolic heart failure ([place]) 04/06/2016  . Precordial pain   . Drug eruption 03/14/2016  . Psoriasis 10/18/2015  . GERD (gastroesophageal reflux disease) 07/05/2015  . 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 Scleroderma ([place]) 03/09/2015  . Metabolic syndrome X 67/89/3810  . Encounter for long-term (current) use of other medications 01/14/2015  . Seasonal allergies 01/05/2015  . Iron deficiency anemia 09/11/2014  . Tubular adenoma of colon 09/11/2014  . Neoplasm of uncertain behavior of skin 05/09/2012  . Chronic fatigue 10/26/2010  . Secondary hyperparathyroidism ([place]) 12/02/2008  . Vitamin D deficiency 10/10/2008  . Hypothyroidism 08/27/2007  . Essential hypertension 08/27/2007   Past Medical History:  Diagnosis Date  . Allergy   . Anemia   . Arthritis   . Asthma   . Chronic bronchitis ([place])   . Connective tissue disease ([place])   . Diabetes mellitus   . Endometriosis   . Fibroid uterus   . GERD (gastroesophageal reflux disease)   . History of Doppler ultrasound    a. Carotid US 8/08:Estimated stenosis in the right and left internal carotid arteries is 0-50% and 0-50%.  . History of echocardiogram    a. Echo 6/16: EF 60-65%, no RWMA.  . History of hiatal hernia   . History of nuclear stress test    a. Myoview 6/16: There is no resting or stress perfusion defect consistent with no prior infarct and no ischemia. EF 73 %  The patient was hypertensive prior and during the study. Cardiac cath 04/2016 showed normal coronary arteries  . Hx of iron deficiency anemia   . Hypersomnia with sleep apnea   . Hypertension   . Lupus ([place]) 07/2013   Of Skin  . Menorrhagia   . Obesity   . Pneumonia   . 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 Secondary hyperthyroidism   . Thyroid disease   . Tinnitus    left ear  . Tubular adenoma of colon   . Vertigo   . Vitamin D deficiency     Family History  Problem Relation Age of Onset  . Hypertension Mother   . Diabetes  Mother   . Arthritis Mother   . Depression Mother   . CAD Mother        s/p PCI  . Diabetes Father   . Stroke Father   . Arthritis Father   . Depression Father   . Hypertension Father   . Cancer Father        prostate  . Hypertension Sister        x 2  . Breast cancer Sister   . Arthritis Sister   . Bleeding Disorder Sister   . Depression Sister   . Thyroid disease Sister   . Colon cancer Maternal Grandfather   . Stroke Maternal Aunt   . Heart attack Neg Hx     Past Surgical History:  Procedure  Laterality Date  . ABDOMINAL HYSTERECTOMY  2004   supracervical hysterectomy  . BREAST SURGERY  2005   reduction  . BREATH TEK H PYLORI [name] 12/24/2014   Procedure: BREATH TEK H PYLORI;  Surgeon: Greer Pickerel, MD;  Location: [facility];  Service: General;  Laterality: [name];  . CARDIAC CATHETERIZATION [name] 03/22/2016   Procedure: Right/Left Heart Cath and Coronary Angiography;  Surgeon: Troy Sine, MD;  Location: [facility];  Service: Cardiovascular;  Laterality: [name];  . DILATION AND CURETTAGE OF UTERUS     age 23  . ESOPHAGOGASTRODUODENOSCOPY (EGD) WITH PROPOFOL [name] 10/14/2015   Procedure: ESOPHAGOGASTRODUODENOSCOPY (EGD) WITH PROPOFOL;  Surgeon: Clarene Essex, MD;  [facility];  Service: Endoscopy;  Laterality: [name];  . LAPAROSCOPIC GASTRIC SLEEVE RESECTION WITH HIATAL HERNIA REPAIR [name] 07/05/2015   Procedure: LAPAROSCOPIC GASTRIC SLEEVE RESECTION WITH HIATAL HERNIA REPAIR, upper endoscopy;  Surgeon: Greer Pickerel, MD;  Location[facility];  Service: General;  Laterality: [name];  . REDUCTION MAMMAPLASTY Bilateral    Social History   Occupational History  . Occupation: Therapist, music: [employer]  Tobacco Use  . Smoking status: Never Smoker  . Smokeless tobacco: Never Used  Substance and Sexual Activity  . Alcohol use: No    Alcohol/week: 0.0 standard drinks  . Drug use: No  . Sexual activity: Yes    Partners: Male    Birth  control/protection: Surgical    Comment: hysterectomy (supracervical)

## 2018-03-27 ENCOUNTER — Encounter: Payer: Self-pay | Admitting: "Endocrinology

## 2018-03-27 ENCOUNTER — Ambulatory Visit (INDEPENDENT_AMBULATORY_CARE_PROVIDER_SITE_OTHER): Payer: Self-pay

## 2018-03-27 ENCOUNTER — Ambulatory Visit (INDEPENDENT_AMBULATORY_CARE_PROVIDER_SITE_OTHER): Payer: 59 | Admitting: Orthopaedic Surgery

## 2018-03-27 ENCOUNTER — Encounter (INDEPENDENT_AMBULATORY_CARE_PROVIDER_SITE_OTHER): Payer: Self-pay | Admitting: Orthopaedic Surgery

## 2018-03-27 VITALS — BP 142/87 | HR 76 | Ht 59.0 in | Wt 200.0 lb

## 2018-03-27 DIAGNOSIS — L089 Local infection of the skin and subcutaneous tissue, unspecified: Secondary | ICD-10-CM

## 2018-03-27 DIAGNOSIS — S60459D Superficial foreign body of unspecified finger, subsequent encounter: Secondary | ICD-10-CM

## 2018-03-27 NOTE — Progress Notes (Addendum)
Office Visit Note   Patient: Sabrina [name]           Date of Birth: 1960/11/25           MRN: [id number] Visit Date: 03/27/2018              Requested by: Fayrene Helper, [place], [place], [place] [address] PCP: Fayrene Helper, MD   Assessment & Plan: Visit Diagnoses:  1. Foreign body of finger with infection, subsequent encounter     Plan: After informed consent with right small finger digital block at the base of the finger with Xylocaine Betadine prep, minor suture tray with finger extremity drapes, satisfactory block and lateral incision is made midline with spreading with no purulent drainage obtained.  Spreading across the midline to the opposite subcutaneous tissue.  Eschar at the tip of the finger was unroofed removed with sterile pickups sterile scissors and 11 scalpel blade.  Expiration revealed no remaining foreign body present.  She tolerated the procedure well Xeroform 4 x 4 Kling and Coban was applied.  She can remove it at 48 hours wash her finger and apply Band-Aid.  Refill Septra prescribed.  No significant purulence was obtained and no culture was sent.  She will return in 1 [place] [address]. Follow-Up Instructions: Return in about 1 week (around 04/03/2018).   Orders:  Orders Placed This Encounter  Procedures  . XR Finger Little Right   No orders of the defined types were placed in this encounter.     Procedures: No procedures performed   Clinical Data: No additional findings.   Subjective: Chief Complaint  Patient presents with  . Right Little Finger - Wound Check, Follow-up    HPI 57 year old female 4 weeks post splinter to the tip of her right ring finger with recent increase swelling and tenderness.  She saw Dr. Durward Fortes yesterday and was referred to me today for consideration of drainage for possible bacterial [place] ( felon). She originally remove the splinter on antibiotics currently Septra DS 1 p.o. twice  daily.  X-rays taken today are negative for foreign body.  She is been more tender and there is a small eschar at the tip of the finger which was the entry location.  Review of Systems positive for iron deficiency anemia, chronic fatigue, vitamin D deficiency, metabolic syndrome X, GERD, scleroderma, history of pulmonary hypertension, diastolic heart failure, obesity.  Negative is a pertains HPI 14 point review of systems.  Negative for fever chills.   Objective: Vital Signs: BP (!) 142/87   Pulse 76   Ht 4\' 11"  (1.499 m)   Wt 200 lb (90.7 kg)   LMP 10/31/2002   BMI 40.40 kg/m   Physical Exam  Constitutional: She is oriented to person, place, and time. She appears well-developed.  HENT:  Head: Normocephalic.  Right Ear: External ear normal.  Left Ear: External ear normal.  Eyes: Pupils are equal, round, and reactive to light.  Neck: No tracheal deviation present. No thyromegaly present.  Cardiovascular: Normal rate.  Pulmonary/Chest: Effort normal.  Abdominal: Soft.  Neurological: She is alert and oriented to person, place, and time.  Skin: Skin is warm and dry.  Psychiatric: She has a normal mood and affect. Her behavior is normal.    Ortho Exam negative for epitrochlear or cervical nodes.  There is prominence of the right small finger thumb pad does not appear to be tense it swollen tender there is a 2  mm eschar at the tip.  Nailbed is normal two-point sensation the tip is normal flexor and extensor is intact.  Pain with gripping squeezing.  No purulent drainage.  Specialty Comments:  No specialty comments available.  Imaging: Xr Finger Little Right  Result Date: 03/27/2018 2 view x-rays right little finger obtained and reviewed.  There is some prominence of the distal pad of the small finger without foreign body visualized.  No evidence of osteomyelitis.  Negative for acute fracture. Impression: Normal finger x-rays other than soft tissue swelling over the volar surface at  the fingertip.  No foreign body seen.    PMFS History: Patient Active Problem List   Diagnosis Date Noted  . Foreign body in finger-infected 03/19/2018  . Pain in finger of right hand 03/17/2018  . Upper airway cough syndrome 02/04/2018  . ILD (interstitial lung disease) ([place]) 11/21/2017  . Insomnia 05/20/2017  . Morbid obesity ([place]) 05/20/2017  . Elevated TSH 12/16/2016  . Hypocalcemia 07/07/2016  . Hypokalemia 07/07/2016  . 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 Pulmonary hypertension ([place]) 04/06/2016  . Chronic diastolic heart failure ([place]) 04/06/2016  . Precordial pain   . Drug eruption 03/14/2016  . Psoriasis 10/18/2015  . GERD (gastroesophageal reflux disease) 07/05/2015  . 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 Scleroderma ([place]) 03/09/2015  . Metabolic syndrome X 72/04/4708  . Encounter for long-term (current) use of other medications 01/14/2015  . Seasonal allergies 01/05/2015  . Iron deficiency anemia 09/11/2014  . Tubular adenoma of colon 09/11/2014  . Neoplasm of uncertain behavior of skin 05/09/2012  . Chronic fatigue 10/26/2010  . Secondary hyperparathyroidism ([place]) 12/02/2008  . Vitamin D deficiency 10/10/2008  . Hypothyroidism 08/27/2007  . Essential hypertension 08/27/2007   Past Medical History:  Diagnosis Date  . Allergy   . Anemia   . Arthritis   . Asthma   . Chronic bronchitis ([place])   . Connective tissue disease ([place])   . Diabetes mellitus   . Endometriosis   . Fibroid uterus   . GERD (gastroesophageal reflux disease)   . History of Doppler ultrasound    a. Carotid US 8/08:Estimated stenosis in the right and left internal carotid arteries is 0-50% and 0-50%.  . History of echocardiogram    a. Echo 6/16: EF 60-65%, no RWMA.  . History of hiatal hernia   . History of nuclear stress test    a. Myoview 6/16: There is no resting or stress perfusion defect consistent with no  prior infarct and no ischemia. EF 73 %  The patient was hypertensive prior and during the study. Cardiac cath 04/2016 showed normal coronary arteries  . Hx of iron deficiency anemia   . Hypersomnia with sleep apnea   . Hypertension   . Lupus ([place]) 07/2013   Of Skin  . Menorrhagia   . Obesity   . Pneumonia   . 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 Secondary hyperthyroidism   . Thyroid disease   . Tinnitus    left ear  . Tubular adenoma of colon   . Vertigo   . Vitamin D deficiency     Family History  Problem Relation Age of Onset  . Hypertension Mother   . Diabetes Mother   . Arthritis Mother   . Depression Mother   . CAD Mother        s/p PCI  . Diabetes Father   . Stroke Father   . Arthritis Father   .  Depression Father   . Hypertension Father   . Cancer Father        prostate  . Hypertension Sister        x 2  . Breast cancer Sister   . Arthritis Sister   . Bleeding Disorder Sister   . Depression Sister   . Thyroid disease Sister   . Colon cancer Maternal Grandfather   . Stroke Maternal Aunt   . Heart attack Neg Hx     Past Surgical History:  Procedure Laterality Date  . ABDOMINAL HYSTERECTOMY  2004   supracervical hysterectomy  . BREAST SURGERY  2005   reduction  . BREATH TEK H PYLORI [name] 12/24/2014   Procedure: BREATH TEK H PYLORI;  Surgeon: Greer Pickerel, MD;  Location: [facility];  Service: General;  Laterality: [name];  . CARDIAC CATHETERIZATION [name] 03/22/2016   Procedure: Right/Left Heart Cath and Coronary Angiography;  Surgeon: Troy Sine, MD;  Location: [facility];  Service: Cardiovascular;  Laterality: [name];  . DILATION AND CURETTAGE OF UTERUS     age 57  . ESOPHAGOGASTRODUODENOSCOPY (EGD) WITH PROPOFOL [name] 10/14/2015   Procedure: ESOPHAGOGASTRODUODENOSCOPY (EGD) WITH PROPOFOL;  Surgeon: Clarene Essex, MD;  [facility];  Service: Endoscopy;  Laterality: [name];  . LAPAROSCOPIC GASTRIC SLEEVE RESECTION WITH HIATAL HERNIA REPAIR [name] 07/05/2015     Procedure: LAPAROSCOPIC GASTRIC SLEEVE RESECTION WITH HIATAL HERNIA REPAIR, upper endoscopy;  Surgeon: Greer Pickerel, MD;  [facility];  Service: General;  Laterality: [name];  . REDUCTION MAMMAPLASTY Bilateral    Social History   Occupational History  . Occupation: Therapist, music: [employer]  Tobacco Use  . Smoking status: Never Smoker  . Smokeless tobacco: Never Used  Substance and Sexual Activity  . Alcohol use: No    Alcohol/week: 0.0 standard drinks  . Drug use: No  . Sexual activity: Yes    Partners: Male    Birth control/protection: Surgical    Comment: hysterectomy (supracervical)

## 2018-03-28 ENCOUNTER — Other Ambulatory Visit: Payer: Self-pay | Admitting: Family Medicine

## 2018-03-28 ENCOUNTER — Telehealth (INDEPENDENT_AMBULATORY_CARE_PROVIDER_SITE_OTHER): Payer: Self-pay | Admitting: Radiology

## 2018-03-28 MED ORDER — SULFAMETHOXAZOLE-TRIMETHOPRIM 800-160 MG PO TABS: 1.0000 | ORAL_TABLET | Freq: Two times a day (BID) | ORAL | 0 refills | Status: DC

## 2018-03-28 NOTE — Telephone Encounter (Signed)
Patient called stating she was told we were going to refill her Bactrim, but the pharmacy does not have it. Script sent in. Patient advised.

## 2018-03-28 NOTE — Addendum Note (Signed)
Addended by: Meyer Cory on: 03/28/2018 04:44 PM   Modules accepted: Orders

## 2018-04-03 ENCOUNTER — Ambulatory Visit (INDEPENDENT_AMBULATORY_CARE_PROVIDER_SITE_OTHER): Payer: 59 | Admitting: Orthopaedic Surgery

## 2018-04-03 ENCOUNTER — Encounter (INDEPENDENT_AMBULATORY_CARE_PROVIDER_SITE_OTHER): Payer: Self-pay | Admitting: Orthopaedic Surgery

## 2018-04-03 VITALS — BP 150/87 | HR 71 | Ht 59.0 in | Wt 200.0 lb

## 2018-04-03 DIAGNOSIS — S60459D Superficial foreign body of unspecified finger, subsequent encounter: Secondary | ICD-10-CM

## 2018-04-03 DIAGNOSIS — M79645 Pain in left finger(s): Secondary | ICD-10-CM

## 2018-04-03 DIAGNOSIS — L089 Local infection of the skin and subcutaneous tissue, unspecified: Secondary | ICD-10-CM

## 2018-04-03 NOTE — Progress Notes (Signed)
Office Visit Note   Patient: Sabrina [name]           Date of Birth: 1961/03/28           MRN: [id number] Visit Date: 04/03/2018              Requested by: Fayrene Helper, MD [address], [place], [place] [address] PCP: Fayrene Helper, MD   Assessment & Plan: Visit Diagnoses: non herpetic Whitlow small finger  Plan: Patient can finish out her antibiotics I will recheck her in 3 weeks.  Midline lateral incision has healed over is no longer draining.  She still has an eschar over the tip of the finger.  Slight tenderness of the pad but is soft.    Follow-Up Instructions: Return in about 3 weeks (around 04/24/2018).   Orders:  No orders of the defined types were placed in this encounter.  No orders of the defined types were placed in this encounter.     Procedures: No procedures performed   Clinical Data: No additional findings.   Subjective: Chief Complaint  Patient presents with  . Right Little Finger - Follow-up    HPI follow-up non-herpetic whitlow drainage 1 week ago for Felon. ( not criminal felon but felon as in infected fingertip)  Review of Systems Unchanged.  Objective: Vital Signs: BP (!) 150/87   Pulse 71   Ht 4\' 11"  (1.499 m)   Wt 200 lb (90.7 kg)   LMP 10/31/2002   BMI 40.40 kg/m   Physical Exam see above  Ortho Exam see above description of finger  Specialty Comments:  No specialty comments available.  Imaging: No results found.   PMFS History: Patient Active Problem List   Diagnosis Date Noted  . Foreign body in finger-infected 03/19/2018  . Pain in finger of right hand 03/17/2018  . Upper airway cough syndrome 02/04/2018  . ILD (interstitial lung disease) ([place]) 11/21/2017  . Insomnia 05/20/2017  . Morbid obesity ([place]) 05/20/2017  . Elevated TSH 12/16/2016  . Hypocalcemia 07/07/2016  . Hypokalemia 07/07/2016  . 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 Connective tissue disease, undifferentiated ([place])  06/22/2016  . Pulmonary hypertension ([place]) 04/06/2016  . Chronic diastolic heart failure ([place]) 04/06/2016  . Precordial pain   . Drug eruption 03/14/2016  . Psoriasis 10/18/2015  . GERD (gastroesophageal reflux disease) 07/05/2015  . 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 Scleroderma ([place]) 03/09/2015  . Metabolic syndrome X 61/95/0932  . Encounter for long-term (current) use of other medications 01/14/2015  . Seasonal allergies 01/05/2015  . Iron deficiency anemia 09/11/2014  . Tubular adenoma of colon 09/11/2014  . Neoplasm of uncertain behavior of skin 05/09/2012  . Chronic fatigue 10/26/2010  . Secondary hyperparathyroidism ([place]) 12/02/2008  . Vitamin D deficiency 10/10/2008  . Hypothyroidism 08/27/2007  . Essential hypertension 08/27/2007   Past Medical History:  Diagnosis Date  . Allergy   . Anemia   . Arthritis   . Asthma   . Chronic bronchitis ([place])   . Connective tissue disease ([place])   . Diabetes mellitus   . Endometriosis   . Fibroid uterus   . GERD (gastroesophageal reflux disease)   . History of Doppler ultrasound    a. Carotid US 8/08:Estimated stenosis in the right and left internal carotid arteries is 0-50% and 0-50%.  . History of echocardiogram    a. Echo 6/16: EF 60-65%, no RWMA.  . History of hiatal hernia   . History of nuclear  stress test    a. Myoview 6/16: There is no resting or stress perfusion defect consistent with no prior infarct and no ischemia. EF 73 %  The patient was hypertensive prior and during the study. Cardiac cath 04/2016 showed normal coronary arteries  . Hx of iron deficiency anemia   . Hypersomnia with sleep apnea   . Hypertension   . Lupus ([place]) 07/2013   Of Skin  . Menorrhagia   . Obesity   . Pneumonia   . 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 Secondary hyperthyroidism   . Thyroid disease   . Tinnitus    left ear  . Tubular adenoma of colon   . Vertigo   . Vitamin D deficiency     Family History  Problem  Relation Age of Onset  . Hypertension Mother   . Diabetes Mother   . Arthritis Mother   . Depression Mother   . CAD Mother        s/p PCI  . Diabetes Father   . Stroke Father   . Arthritis Father   . Depression Father   . Hypertension Father   . Cancer Father        prostate  . Hypertension Sister        x 2  . Breast cancer Sister   . Arthritis Sister   . Bleeding Disorder Sister   . Depression Sister   . Thyroid disease Sister   . Colon cancer Maternal Grandfather   . Stroke Maternal Aunt   . Heart attack Neg Hx     Past Surgical History:  Procedure Laterality Date  . ABDOMINAL HYSTERECTOMY  2004   supracervical hysterectomy  . BREAST SURGERY  2005   reduction  . BREATH TEK H PYLORI [name] 12/24/2014   Procedure: BREATH TEK H PYLORI;  Surgeon: Greer Pickerel, MD;  Location: [facility];  Service: General;  Laterality: [name];  . CARDIAC CATHETERIZATION [name] 03/22/2016   Procedure: Right/Left Heart Cath and Coronary Angiography;  Surgeon: [place] Sine, MD;  Location: [facility];  Service: Cardiovascular;  Laterality: [name];  . DILATION AND CURETTAGE OF UTERUS     age 20  . ESOPHAGOGASTRODUODENOSCOPY (EGD) WITH PROPOFOL [name] 10/14/2015   Procedure: ESOPHAGOGASTRODUODENOSCOPY (EGD) WITH PROPOFOL;  Surgeon: Clarene Essex, MD;  Location: [facility];  Service: Endoscopy;  Laterality: [name];  . LAPAROSCOPIC GASTRIC SLEEVE RESECTION WITH HIATAL HERNIA REPAIR [name] 07/05/2015   Procedure: LAPAROSCOPIC GASTRIC SLEEVE RESECTION WITH HIATAL HERNIA REPAIR, upper endoscopy;  Surgeon: Greer Pickerel, MD;  Location: [facility];  Service: General;  Laterality: [name];  . REDUCTION MAMMAPLASTY Bilateral    Social History   Occupational History  . Occupation: Therapist, music: [employer]  Tobacco Use  . Smoking status: Never Smoker  . Smokeless tobacco: Never Used  Substance and Sexual Activity  . Alcohol use: No    Alcohol/week: 0.0 standard drinks  . Drug use: No  .  Sexual activity: Yes    Partners: Male    Birth control/protection: Surgical    Comment: hysterectomy (supracervical)

## 2018-04-04 DIAGNOSIS — M349 Systemic sclerosis, unspecified: Secondary | ICD-10-CM | POA: Diagnosis not present

## 2018-04-04 DIAGNOSIS — I2721 Secondary pulmonary arterial hypertension: Secondary | ICD-10-CM | POA: Diagnosis not present

## 2018-04-04 DIAGNOSIS — Z79899 Other long term (current) drug therapy: Secondary | ICD-10-CM | POA: Diagnosis not present

## 2018-04-04 DIAGNOSIS — R0602 Shortness of breath: Secondary | ICD-10-CM | POA: Diagnosis not present

## 2018-04-10 ENCOUNTER — Telehealth (INDEPENDENT_AMBULATORY_CARE_PROVIDER_SITE_OTHER): Payer: Self-pay | Admitting: Radiology

## 2018-04-10 DIAGNOSIS — L089 Local infection of the skin and subcutaneous tissue, unspecified: Secondary | ICD-10-CM

## 2018-04-10 DIAGNOSIS — S60459D Superficial foreign body of unspecified finger, subsequent encounter: Principal | ICD-10-CM

## 2018-04-10 NOTE — Progress Notes (Signed)
Cardiology Office Note:    Date:  04/11/2018   ID:  Sabrina, [name] Oct 02, 1960, MRN [id number]  PCP:  Sabrina Helper, MD  Cardiologist:  No primary care provider on file.    Referring MD: Sabrina Helper, MD   Chief Complaint  Patient presents with  . Follow-up    PUlmonary HTN, CHF, HTN    History of Present Illness:    Sabrina [name] is a 57 y.o. female with a hx of diastolic CHF, severe pulmonary HTN (PASP 114 mmHg by recent cath), morbid obesity, HTN, HL, GERD, DM2 and normal coronary arteries by cath.  Echocardiogram demonstrated normal LV function, mild diastolic dysfunction, findings consistent with RV pressure and volume overload, trivial MR, no mitral stenosis, mild TR, PASP 91 mmHg, trivial pericardial effusion.  VQ scan was low probability for PE and cath showed normal LVEDP.  Sleep study was negative and no valvular heart dz.  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  She is followed by Dr. Lamonte [name] in Pulmonary.    When I saw her 06/2016, she had a prolonged QT interval at 589msec and she was referred to EP.  She was last seen by Dr. Curt [name] in June 2019 and QT was prolonged again.  She was started on Lopressor 12.5mg  BID.    She is here today for followup and is doing well.  She denies any chest pain or pressure, SOB, DOE, PND, orthopnea, LE edema, dizziness, palpitations or syncope. She is compliant with her meds and is tolerating meds with no SE.    Past Medical History:  Diagnosis Date  . Allergy   . Anemia   . Arthritis   . Asthma   . Chronic bronchitis ([place])   . Connective tissue disease ([place])   . Diabetes mellitus   . Endometriosis   . Fibroid uterus   . GERD (gastroesophageal reflux disease)   . History of Doppler ultrasound    a. Carotid US 8/08:Estimated stenosis in the right and left internal carotid arteries is 0-50% and 0-50%.  . History of  echocardiogram    a. Echo 6/16: EF 60-65%, no RWMA.  . History of hiatal hernia   . History of nuclear stress test    a. Myoview 6/16: There is no resting or stress perfusion defect consistent with no prior infarct and no ischemia. EF 73 %  The patient was hypertensive prior and during the study. Cardiac cath 04/2016 showed normal coronary arteries  . Hx of iron deficiency anemia   . Hypersomnia with sleep apnea   . Hypertension   . Lupus ([place]) 07/2013   Of Skin  . Menorrhagia   . Obesity   . Pneumonia   . 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 Secondary hyperthyroidism   . Thyroid disease   . Tinnitus    left ear  . Tubular adenoma of colon   . Vertigo   . Vitamin D deficiency     Past Surgical History:  Procedure Laterality Date  . ABDOMINAL HYSTERECTOMY  2004   supracervical hysterectomy  . BREAST SURGERY  2005   reduction  . BREATH TEK H PYLORI [name] 12/24/2014   Procedure: BREATH TEK H PYLORI;  Surgeon: Sabrina Pickerel, MD;  Location: [facility];  Service: General;  Laterality: [name];  . CARDIAC CATHETERIZATION [name] 03/22/2016   Procedure: Right/Left Heart Cath and Coronary Angiography;  Surgeon: Sabrina Sine, MD;  Location: [facility]  INVASIVE CV LAB;  Service: Cardiovascular;  Laterality: [name];  . DILATION AND CURETTAGE OF UTERUS     age 66  . ESOPHAGOGASTRODUODENOSCOPY (EGD) WITH PROPOFOL [name] 10/14/2015   Procedure: ESOPHAGOGASTRODUODENOSCOPY (EGD) WITH PROPOFOL;  Surgeon: Clarene Essex, MD;  Location: [facility];  Service: Endoscopy;  Laterality: [name];  . LAPAROSCOPIC GASTRIC SLEEVE RESECTION WITH HIATAL HERNIA REPAIR [name] 07/05/2015   Procedure: LAPAROSCOPIC GASTRIC SLEEVE RESECTION WITH HIATAL HERNIA REPAIR, upper endoscopy;  Surgeon: Sabrina Pickerel, MD;  Location: [facility];  Service: General;  Laterality: [name];  . REDUCTION MAMMAPLASTY Bilateral     Current Medications: Current Meds  Medication Sig  . acetaminophen (TYLENOL) 500 MG tablet Take 500 mg by mouth every 6 (six) hours as needed  for headache.  [name] ambrisentan (LETAIRIS) 5 MG tablet   . aspirin EC 81 MG tablet Take 1 tablet (81 mg total) by mouth daily.  . Calcium Citrate-Vitamin D (CALCIUM CITRATE + D PO) Take 500 mg by mouth 2 (two) times daily with a meal. Vit D 800 with magnesium  . ergocalciferol (VITAMIN D2) 50000 units capsule Take 1 capsule (50,000 Units total) by mouth once a week. One capsule once weekly  . furosemide (LASIX) 40 MG tablet Take 40 mg by mouth 3 (three) times a week.   [name] KLOR-CON 10 10 MEQ tablet TAKE 1 TABLET BY MOUTH TWICE A DAY  . levothyroxine (SYNTHROID, LEVOTHROID) 50 MCG tablet Take 1 tablet (50 mcg total) by mouth daily.  . Multiple Vitamin (MULTIVITAMIN WITH MINERALS) TABS tablet Take 1 tablet by mouth daily.  . mycophenolate (MYFORTIC) 360 MG TBEC EC tablet Take by mouth.  . nitroGLYCERIN (NITROSTAT) 0.4 MG SL tablet Place 0.4 mg under the tongue every 5 (five) minutes as needed for chest pain (3 DOSES MAX).  [name] pantoprazole (PROTONIX) 40 MG tablet Take 30- 60 min before your first and last meals of the day  . sulfamethoxazole-trimethoprim (BACTRIM DS,SEPTRA DS) 800-160 MG tablet Take 1 tablet by mouth 2 (two) times daily.  . temazepam (RESTORIL) 15 MG capsule Take 1 capsule (15 mg total) by mouth at bedtime as needed for sleep.  [name] triamcinolone ointment (KENALOG) 0.1 % APPLY AS DIRECTED TO ITCHING SPOTS ON THE BODY DAILY TO TWICE DAILY AS NEEDED. NOT TO FACE.     Allergies:   Peanut-containing drug products; Potassium iodide; Ace inhibitors; Codeine; Doxycycline; Peanut oil; Amlodipine; Latex; and Penicillins   Social History   Socioeconomic History  . Marital status: Married    Spouse name: Not on file  . Number of children: Not on file  . Years of education: 65  . Highest education level: Not on file  Occupational History  . Occupation: Therapist, music: [employer]  Social Needs  . Financial resource strain: Not on file  . Food insecurity:     Worry: Not on file    Inability: Not on file  . Transportation needs:    Medical: Not on file    Non-medical: Not on file  Tobacco Use  . Smoking status: Never Smoker  . Smokeless tobacco: Never Used  Substance and Sexual Activity  . Alcohol use: No    Alcohol/week: 0.0 standard drinks  . Drug use: No  . Sexual activity: Yes    Partners: Male    Birth control/protection: Surgical    Comment: hysterectomy (supracervical)  Lifestyle  . Physical activity:    Days per week: Not on file    Minutes per session: Not on  file  . Stress: Not on file  Relationships  . Social connections:    Talks on phone: Not on file    Gets together: Not on file    Attends religious service: Not on file    Active member of club or organization: Not on file    Attends meetings of clubs or organizations: Not on file    Relationship status: Not on file  Other Topics Concern  . Not on file  Social History Narrative   Regular exercise-no   Caffeine Use-no      [place] [facility]: Patient lives with her husband. Works for [employer]. No bird or mold exposure. No pets currently.           Family History: The patient's family history includes Arthritis in her father, mother, and sister; Bleeding Disorder in her sister; Breast cancer in her sister; CAD in her mother; Cancer in her father; Colon cancer in her maternal grandfather; Depression in her father, mother, and sister; Diabetes in her father and mother; Hypertension in her father, mother, and sister; Stroke in her father and maternal aunt; Thyroid disease in her sister. There is no history of Heart attack.  ROS:   Please see the history of present illness.    ROS  All other systems reviewed and negative.   EKGs/Labs/Other Studies Reviewed:    The following studies were reviewed today: none  EKG:  EKG is  ordered today.  The ekg ordered today demonstrates NSR at 62bpm with RVH, inferolateral and anterior ST/T wave abnormality and  prolonged QTc at 473msc.    Recent Labs: 12/17/2017: Hemoglobin 11.9; Platelets 332 12/23/2017: TSH 7.89 01/23/2018: BUN 26; Creatinine, Ser 1.32; Potassium 4.2; Sodium 143   Recent Lipid Panel    Component Value Date/Time   CHOL 166 12/23/2017 0738   TRIG 109 12/23/2017 0738   HDL 35 (L) 12/23/2017 0738   CHOLHDL 4.7 12/23/2017 0738   VLDL 18 12/11/2016 0743   LDLCALC 109 (H) 12/23/2017 0738    Physical Exam:    VS:  BP (!) 150/84   Pulse 75   Ht 4\' 11"  (1.499 m)   Wt 199 lb 12.8 oz (90.6 kg)   LMP 10/31/2002   SpO2 95%   BMI 40.35 kg/m     Wt Readings from Last 3 Encounters:  04/11/18 199 lb 12.8 oz (90.6 kg)  04/03/18 200 lb (90.7 kg)  03/27/18 200 lb (90.7 kg)     GEN:  Well nourished, well developed in no acute distress HEENT: Normal NECK: No JVD; No carotid bruits LYMPHATICS: No lymphadenopathy CARDIAC: RRR, no murmurs, rubs, gallops RESPIRATORY:  Clear to auscultation without rales, wheezing or rhonchi  ABDOMEN: Soft, non-tender, non-distended MUSCULOSKELETAL:  No edema; No deformity  SKIN: Warm and dry NEUROLOGIC:  Alert and oriented x 3 PSYCHIATRIC:  Normal affect   ASSESSMENT:    1. Chronic diastolic heart failure ([place])   2. Essential hypertension   3. Pulmonary hypertension ([place])   4. Prolonged QT syndrome    PLAN:    In order of problems listed above:  1.  Chronic diastolic CHF - she appears euvolemic on exam today and weight is stable  She will continue on Lasix 40mg  daily.  Creatinine is stable at 1.32 and K+ 4.2 on 01/23/2018.  2.  HTN - BP is controlled on exam today.  She is currently not on antihypertensive meds.    3.  Severe pulmonary HTN - secondary to connective tissue d/o followed  by Dr. Lamonte [name].  PASP > 90mmHg at cath but improved to 28mmHg by echo 08/2017.  SHe will continue on diuretics and Ambrisentan.  4.  Prolonged QTc - placed on BB by Dr. Curt [name] but stopped due to cough and SOB. QTc today improved from 01/20/2018 but remains  prolonged.  She has followup next month with EP.   Medication Adjustments/Labs and Tests Ordered: Current medicines are reviewed at length with the patient today.  Concerns regarding medicines are outlined above.  No orders of the defined types were placed in this encounter.  No orders of the defined types were placed in this encounter.   Signed, Fransico Him, MD  04/11/2018 8:14 AM    [place]

## 2018-04-10 NOTE — Telephone Encounter (Signed)
Patient called [place] office this morning. She states that her finger is not getting any better, it hurts all of the time, and is staying swollen.  The pain is so bad that she cannot sleep.  Please advise. [facility] uses [facility] in [place].

## 2018-04-10 NOTE — Telephone Encounter (Signed)
Order MRI of finger ROV after scan . thanks

## 2018-04-10 NOTE — Telephone Encounter (Signed)
MRI right little finger being ordered STAT at [facility].  Patient can go anytime, except before 10am tomorrow.    Order and demographics submitted to patient's insurance. Awaiting review.

## 2018-04-11 ENCOUNTER — Ambulatory Visit (INDEPENDENT_AMBULATORY_CARE_PROVIDER_SITE_OTHER): Payer: 59 | Admitting: Cardiology

## 2018-04-11 ENCOUNTER — Encounter: Payer: Self-pay | Admitting: Orthopaedic Surgery

## 2018-04-11 ENCOUNTER — Encounter: Payer: Self-pay | Admitting: Cardiology

## 2018-04-11 VITALS — BP 150/84 | HR 75 | Ht 59.0 in | Wt 199.8 lb

## 2018-04-11 DIAGNOSIS — I5032 Chronic diastolic (congestive) heart failure: Secondary | ICD-10-CM | POA: Diagnosis not present

## 2018-04-11 DIAGNOSIS — I4581 Long QT syndrome: Secondary | ICD-10-CM | POA: Diagnosis not present

## 2018-04-11 DIAGNOSIS — I272 Pulmonary hypertension, unspecified: Secondary | ICD-10-CM | POA: Diagnosis not present

## 2018-04-11 DIAGNOSIS — M7989 Other specified soft tissue disorders: Secondary | ICD-10-CM | POA: Diagnosis not present

## 2018-04-11 DIAGNOSIS — I10 Essential (primary) hypertension: Secondary | ICD-10-CM | POA: Diagnosis not present

## 2018-04-11 NOTE — Telephone Encounter (Signed)
Received authorization from insurance company after review this morning. Order entered for STAT MRI of the right hand for possible infection/retained foreign body.

## 2018-04-11 NOTE — Patient Instructions (Signed)
Your physician recommends that you continue on your current medications as directed. Please refer to the Current Medication list given to you today.   Your physician wants you to follow-up in: YEAR WITH DR TURNER You will receive a reminder letter in the mail two months in advance. If you don't receive a letter, please call our office to schedule the follow-up appointment.  

## 2018-04-11 NOTE — Addendum Note (Signed)
Addended by: Meyer Cory on: 04/11/2018 09:45 AM   Modules accepted: Orders

## 2018-04-15 ENCOUNTER — Telehealth (INDEPENDENT_AMBULATORY_CARE_PROVIDER_SITE_OTHER): Payer: Self-pay | Admitting: Radiology

## 2018-04-15 DIAGNOSIS — L089 Local infection of the skin and subcutaneous tissue, unspecified: Secondary | ICD-10-CM

## 2018-04-15 DIAGNOSIS — S60459D Superficial foreign body of unspecified finger, subsequent encounter: Principal | ICD-10-CM

## 2018-04-15 MED ORDER — ACETAMINOPHEN-CODEINE #3 300-30 MG PO TABS: 1.0000 | ORAL_TABLET | Freq: Four times a day (QID) | ORAL | 0 refills | Status: DC | PRN

## 2018-04-15 NOTE — Telephone Encounter (Signed)
Ok tylenol # 3    # 20  1 po q 6 hrs prn pain. Ucall. Let her know we are trying to get Hand Surgeon appt.

## 2018-04-15 NOTE — Telephone Encounter (Signed)
I called patient and advised. Per Dr Lorin Mercy, make patient a follow up appt with him. Patient appt made for Thursday in [place] office.

## 2018-04-15 NOTE — Telephone Encounter (Signed)
MRI Right Hand was performed and results are available for review. Dr. Lorin Mercy would like patient to be referred to [place].  He will call patient once appointment is made.

## 2018-04-15 NOTE — Telephone Encounter (Signed)
Patient called stating that she is in pain every day and would like something for pain.  She stated that she can hardly write with that hand and it is completely swollen.  She would like a call back.  [phone] (Work).  Thank you.

## 2018-04-15 NOTE — Telephone Encounter (Signed)
Please see below and advise. Notes have been faxed to Dr. Grandville Silos and we are waiting to see if is able to see the patient.

## 2018-04-17 ENCOUNTER — Telehealth (INDEPENDENT_AMBULATORY_CARE_PROVIDER_SITE_OTHER): Payer: Self-pay | Admitting: Orthopaedic Surgery

## 2018-04-17 ENCOUNTER — Telehealth (INDEPENDENT_AMBULATORY_CARE_PROVIDER_SITE_OTHER): Payer: Self-pay

## 2018-04-17 ENCOUNTER — Ambulatory Visit (INDEPENDENT_AMBULATORY_CARE_PROVIDER_SITE_OTHER): Payer: 59 | Admitting: Orthopaedic Surgery

## 2018-04-17 DIAGNOSIS — M79644 Pain in right finger(s): Secondary | ICD-10-CM | POA: Diagnosis not present

## 2018-04-17 NOTE — Telephone Encounter (Signed)
Patient walked into office today stating she needed her MRI disc. After further investigation, scan was done at [facility] in [place] and I advised patient did not have permission to be able to put the scan on a disc because due to policy they were not images that were taken here in our office. Patient stated she was seen at [facility] today by Dr Grandville Silos in follow up for her hand. (I think that Dr Lorin Mercy had referred her there based off of the findings on her MRI scan of the hand) Patient went there for her appointment today and advised Dr Carolann Littler PA that she had already had a scan done but it was done at [facility] in [place]. Patient reports that they were quick to let her know that they did not have access to these images and told her she would have to get images on a disc and bring them back for Dr Grandville Silos to review. I called Dr Carolann Littler office myself and talked with his PA [facility] and advised them that they should be able to pull images even though it is a [facility] facility because images are going through [facility]. She did advise that they were able to locate images, they called patient and scheduled her an appointment to follow up with Dr Grandville Silos for further treatment.

## 2018-04-17 NOTE — Telephone Encounter (Signed)
Fyi.

## 2018-04-17 NOTE — Telephone Encounter (Signed)
The initial phone call was received by a receptionist..patient called office stated she was at [facility] in a patient room and needed the MRI report to be faxed. A medical assistant got on the phone requesting for the report to be faxed. With the request being doctor to doctor the info was given to me to fax the MRI. Upon bringing up the chart at my desk,the MRI report is not available. IC [name] and advise them that I am not able to fax report as it is not in Epic yet

## 2018-04-24 ENCOUNTER — Ambulatory Visit (INDEPENDENT_AMBULATORY_CARE_PROVIDER_SITE_OTHER): Payer: 59 | Admitting: Orthopaedic Surgery

## 2018-04-25 ENCOUNTER — Ambulatory Visit: Payer: 59 | Admitting: "Endocrinology

## 2018-05-02 ENCOUNTER — Ambulatory Visit: Payer: 59 | Admitting: Cardiology

## 2018-05-07 DIAGNOSIS — M79644 Pain in right finger(s): Secondary | ICD-10-CM | POA: Diagnosis not present

## 2018-05-09 DIAGNOSIS — E039 Hypothyroidism, unspecified: Secondary | ICD-10-CM | POA: Diagnosis not present

## 2018-05-12 LAB — COMPLETE METABOLIC PANEL WITH GFR
AG Ratio: 1.4 (calc) (ref 1.0–2.5)
ALKALINE PHOSPHATASE (APISO): 50 U/L (ref 33–130)
ALT: 18 U/L (ref 6–29)
AST: 20 U/L (ref 10–35)
Albumin: 3.8 g/dL (ref 3.6–5.1)
BUN: 16 mg/dL (ref 7–25)
CO2: 29 mmol/L (ref 20–32)
CREATININE: 1.03 mg/dL (ref 0.50–1.05)
Calcium: 7.6 mg/dL — ABNORMAL LOW (ref 8.6–10.4)
Chloride: 103 mmol/L (ref 98–110)
GFR, Est African American: 71 mL/min/{1.73_m2} (ref >=60)
GFR, Est Non African American: 62 mL/min/{1.73_m2} (ref >=60)
GLUCOSE: 84 mg/dL (ref 65–99)
Globulin: 2.7 g/dL (calc) (ref 1.9–3.7)
Potassium: 3.4 mmol/L — ABNORMAL LOW (ref 3.5–5.3)
SODIUM: 142 mmol/L (ref 135–146)
Total Bilirubin: 0.6 mg/dL (ref 0.2–1.2)
Total Protein: 6.5 g/dL (ref 6.1–8.1)

## 2018-05-12 LAB — PHOSPHORUS: PHOSPHORUS: 4.7 mg/dL — AB (ref 2.5–4.5)

## 2018-05-12 LAB — TSH: TSH: 3.06 m[IU]/L

## 2018-05-12 LAB — MAGNESIUM: Magnesium: 1.8 mg/dL (ref 1.5–2.5)

## 2018-05-12 LAB — T4, FREE: FREE T4: 1.2 ng/dL (ref 0.8–1.8)

## 2018-05-12 LAB — PTH, INTACT AND CALCIUM
CALCIUM: 7.6 mg/dL — AB (ref 8.6–10.4)
PTH: 161 pg/mL — AB (ref 14–64)

## 2018-05-13 ENCOUNTER — Telehealth: Payer: Self-pay | Admitting: Family Medicine

## 2018-05-13 NOTE — Telephone Encounter (Incomplete)
Phone # correction ***  [phone]. [name] [name]

## 2018-05-13 NOTE — Telephone Encounter (Signed)
Patient wants to make sure that Dr.Simpson has spoken with Dr.Thompson her hand specialist at [place]. Please return her call @ [phone]/ [phone] (mon-thur 8-6)

## 2018-05-14 ENCOUNTER — Encounter: Payer: Self-pay | Admitting: "Endocrinology

## 2018-05-14 ENCOUNTER — Ambulatory Visit (INDEPENDENT_AMBULATORY_CARE_PROVIDER_SITE_OTHER): Payer: 59 | Admitting: "Endocrinology

## 2018-05-14 ENCOUNTER — Telehealth: Payer: Self-pay | Admitting: Family Medicine

## 2018-05-14 DIAGNOSIS — E119 Type 2 diabetes mellitus without complications: Secondary | ICD-10-CM | POA: Diagnosis not present

## 2018-05-14 DIAGNOSIS — E042 Nontoxic multinodular goiter: Secondary | ICD-10-CM | POA: Diagnosis not present

## 2018-05-14 DIAGNOSIS — E039 Hypothyroidism, unspecified: Secondary | ICD-10-CM | POA: Diagnosis not present

## 2018-05-14 MED ORDER — LEVOTHYROXINE SODIUM 75 MCG PO TABS: 75.0000 ug | ORAL_TABLET | Freq: Every day | ORAL | 6 refills | Status: DC

## 2018-05-14 NOTE — Telephone Encounter (Signed)
Sabrina [name] is calling in regarding the pain his wife is in , and that the Dr told them her finger has no circulation in it. He said that the Dr is dragging his feet, and PT is in a lot of pain at night, and he needs to talk to Dr Moshe Cipro

## 2018-05-14 NOTE — Progress Notes (Signed)
Endocrinology follow-up note    Subjective:    Patient ID: Sabrina [name], female    DOB: 10-23-1960, PCP Fayrene Helper, MD   Past Medical History:  Diagnosis Date  . Allergy   . Anemia   . Arthritis   . Asthma   . Chronic bronchitis ([place])   . Connective tissue disease ([place])   . Diabetes mellitus   . Endometriosis   . Fibroid uterus   . GERD (gastroesophageal reflux disease)   . History of Doppler ultrasound    a. Carotid US 8/08:Estimated stenosis in the right and left internal carotid arteries is 0-50% and 0-50%.  . History of echocardiogram    a. Echo 6/16: EF 60-65%, no RWMA.  . History of hiatal hernia   . History of nuclear stress test    a. Myoview 6/16: There is no resting or stress perfusion defect consistent with no prior infarct and no ischemia. EF 73 %  The patient was hypertensive prior and during the study. Cardiac cath 04/2016 showed normal coronary arteries  . Hx of iron deficiency anemia   . Hypersomnia with sleep apnea   . Hypertension   . 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 Menorrhagia   . Obesity   . Pneumonia   . 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 Secondary hyperthyroidism   . Thyroid disease   . Tinnitus    left ear  . Tubular adenoma of colon   . Vertigo   . Vitamin D deficiency    Past Surgical History:  Procedure Laterality Date  . ABDOMINAL HYSTERECTOMY  2004   supracervical hysterectomy  . BREAST SURGERY  2005   reduction  . BREATH TEK H PYLORI [name] 12/24/2014   Procedure: BREATH TEK H PYLORI;  Surgeon: Greer Pickerel, MD;  Location: [facility];  Service: General;  Laterality: [name];  . CARDIAC CATHETERIZATION [name] 03/22/2016   Procedure: Right/Left Heart Cath and Coronary Angiography;  Surgeon: Troy Sine, MD;  Location: [facility];  Service: Cardiovascular;  Laterality: [name];  . DILATION AND CURETTAGE OF UTERUS     age 46  . ESOPHAGOGASTRODUODENOSCOPY (EGD) WITH PROPOFOL [name] 10/14/2015   Procedure:  ESOPHAGOGASTRODUODENOSCOPY (EGD) WITH PROPOFOL;  Surgeon: Clarene Essex, MD;  Location: [facility];  Service: Endoscopy;  Laterality: [name];  . LAPAROSCOPIC GASTRIC SLEEVE RESECTION WITH HIATAL HERNIA REPAIR [name] 07/05/2015   Procedure: LAPAROSCOPIC GASTRIC SLEEVE RESECTION WITH HIATAL HERNIA REPAIR, upper endoscopy;  Surgeon: Greer Pickerel, MD;  Location: [facility];  Service: General;  Laterality: [name];  . REDUCTION MAMMAPLASTY Bilateral    Social History   Socioeconomic History  . Marital status: Married    Spouse name: Not on file  . Number of children: Not on file  . Years of education: 44  . Highest education level: Not on file  Occupational History  . Occupation: Therapist, music: Radio broadcast assistant  Social Needs  . Financial resource strain: Not on file  . Food insecurity:    Worry: Not on file    Inability: Not on file  . Transportation needs:    Medical: Not on file    Non-medical: Not on file  Tobacco Use  . Smoking status: Never Smoker  . Smokeless tobacco: Never Used  Substance and Sexual Activity  . Alcohol use: No    Alcohol/week: 0.0 standard drinks  . Drug use: No  . Sexual activity: Yes    Partners: Male    Birth control/protection:  Surgical    Comment: hysterectomy (supracervical)  Lifestyle  . Physical activity:    Days per week: Not on file    Minutes per session: Not on file  . Stress: Not on file  Relationships  . Social connections:    Talks on phone: Not on file    Gets together: Not on file    Attends religious service: Not on file    Active member of club or organization: Not on file    Attends meetings of clubs or organizations: Not on file    Relationship status: Not on file  Other Topics Concern  . Not on file  Social History Narrative   Regular exercise-no   Caffeine Use-no      [facility]: Patient lives with her husband. Works for [employer]. No bird or mold exposure. No pets currently.          Outpatient Encounter Medications as of 05/14/2018  Medication Sig  . acetaminophen (TYLENOL) 500 MG tablet Take 500 mg by mouth every 6 (six) hours as needed for headache.  [name] acetaminophen-codeine (TYLENOL #3) 300-30 MG tablet Take 1 tablet by mouth every 6 (six) hours as needed for moderate pain. (Patient not taking: Reported on 05/14/2018)  . ambrisentan (LETAIRIS) 5 MG tablet   . aspirin EC 81 MG tablet Take 1 tablet (81 mg total) by mouth daily.  . Calcium Citrate-Vitamin D (CALCIUM CITRATE + D PO) Take 500 mg by mouth 2 (two) times daily with a meal. Vit D 800 with magnesium  . ergocalciferol (VITAMIN D2) 50000 units capsule Take 1 capsule (50,000 Units total) by mouth once a week. One capsule once weekly  . furosemide (LASIX) 40 MG tablet Take 40 mg by mouth 3 (three) times a week.   [name] HYDROcodone-acetaminophen (NORCO/VICODIN) 5-325 MG tablet TAKE 1 TABLET BY MOUTH EVERY 4 TO 6 HOURS AS NEEDED FOR PAIN  . KLOR-CON 10 10 MEQ tablet TAKE 1 TABLET BY MOUTH TWICE A DAY  . levothyroxine (SYNTHROID, LEVOTHROID) 75 MCG tablet Take 1 tablet (75 mcg total) by mouth daily before breakfast.  . Multiple Vitamin (MULTIVITAMIN WITH MINERALS) TABS tablet Take 1 tablet by mouth daily.  . mycophenolate (MYFORTIC) 360 MG TBEC EC tablet Take by mouth.  . nitroGLYCERIN (NITROSTAT) 0.4 MG SL tablet Place 0.4 mg under the tongue every 5 (five) minutes as needed for chest pain (3 DOSES MAX).  [name] pantoprazole (PROTONIX) 40 MG tablet Take 30- 60 min before your first and last meals of the day  . temazepam (RESTORIL) 15 MG capsule Take 1 capsule (15 mg total) by mouth at bedtime as needed for sleep.  [name] triamcinolone ointment (KENALOG) 0.1 % APPLY AS DIRECTED TO ITCHING SPOTS ON THE BODY DAILY TO TWICE DAILY AS NEEDED. NOT TO FACE.  . [DISCONTINUED] levothyroxine (SYNTHROID, LEVOTHROID) 50 MCG tablet Take 1 tablet (50 mcg total) by mouth daily.  . [DISCONTINUED] sulfamethoxazole-trimethoprim (BACTRIM DS,SEPTRA DS)  800-160 MG tablet Take 1 tablet by mouth 2 (two) times daily.   No facility-administered encounter medications on file as of 05/14/2018.    ALLERGIES: Allergies  Allergen Reactions  . Peanut-Containing Drug Products Nausea And Vomiting  . Potassium Iodide Other (See Comments)    facial swelling, also eyes, and ears  . Ace Inhibitors Cough  . Codeine Nausea And Vomiting  . Doxycycline Hives  . Peanut Oil Swelling  . Amlodipine Rash  . Latex Hives and Rash  . Penicillins Hives, Itching and Rash    .[name]  Has patient had a PCN reaction causing immediate rash, facial/tongue/throat swelling, SOB or lightheadedness with hypotension: No Has patient had a PCN reaction causing severe rash involving mucus membranes or skin necrosis: No Has patient had a PCN reaction that required hospitalization No Has patient had a PCN reaction occurring within the last 10 years: YES (3 years ago)  If all of the above answers are "NO", then may proceed with Cephalosporin use.     VACCINATION STATUS: Immunization History  Administered Date(s) Administered  . Influenza Split 06/22/2011  . Influenza Whole 07/11/2006  . Influenza,inj,Quad PF,6+ Mos 06/26/2013, 05/11/2014, 06/13/2015, 05/20/2017  . Influenza-Unspecified 06/06/2016  . Pneumococcal Conjugate-13 02/07/2015  . Pneumococcal Polysaccharide-23 06/26/2013  . Td 04/11/2004  . Tdap 05/09/2015    HPI   57 year old female patient with medical history as above. -She is returning for follow-up of hypocalcemia, hypothyroidism.   -She is on levothyroxine 50 mcg p.o. every morning, reports compliance.  She is also on 1000 mg of elemental calcium daily combined with vitamin D supplements.  Her previsit labs show calcium of 7.6 mg per DL improving from 7.1 during her last visit.  She has no new complaints today.   -She is known to have hypocalcemia for at least 5 years however etiology not clear. She denies any parathyroid surgery nor any prior history of  parathyroid dysfunction.  Her PTH is 161, responding appropriately to hypocalcemia.  She denies any family history of parathyroid, thyroid, adrenal, pituitary dysfunction. -He has lost 12 pounds since last visit. -She has history of multinodular goiter with biopsy of one of her nodules consistent with hyperblastic nodule.  Repeat thyroid ultrasound on March 13, 2017 was remarkable for another left lobe nodule 1.5 cm with benign features.  Do not have subsequent follow-up ultrasound of the thyroid. -She denies dysphagia, shortness of breath, voice change. - She has a history of prolonged QT interval.  -She is being treated for left index finger osteomyelitis by her orthopedist Dr.Mark Lorin Mercy.  Review of Systems  Constitutional: + Losing weight, denies fatigue, no subjective hypothermia nor hypothymia.   Eyes: no blurry vision, no xerophthalmia ENT: no sore throat, no nodules palpated in throat, no dysphagia, no odynophagia.   Cardiovascular: no Chest Pain, no Shortness of Breath, no palpitations, no leg swelling Respiratory: no cough, no SOB Gastrointestinal: no Nausea/Vomiting/Diarhhea Musculoskeletal: no muscle/joint aches, + pain/swelling/ulcer on left index finger. 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  Neurological: no tremors, no numbness, no tingling, no dizziness Psychiatric: no depression, no anxiety  Objective:    BP (!) 145/81   Pulse 70   Ht 4\' 11"  (1.499 m)   Wt 197 lb (89.4 kg)   LMP 10/31/2002   BMI 39.79 kg/m   Wt Readings from Last 3 Encounters:  05/14/18 197 lb (89.4 kg)  04/11/18 199 lb 12.8 oz (90.6 kg)  04/03/18 200 lb (90.7 kg)    Physical Exam  Constitutional: + Obese, not in acute distress, normal state of mind.  Eyes: PERRLA, EOMI, no exophthalmos ENT: moist mucous membranes, + she has tight neck making it difficult to palpate for the thyroid, no cervical lymphadenopathy Musculoskeletal:  + Swollen, slightly deformed left index finger with signs of  infection, on orthopedist care.  strength intact in all four extremities Skin: moist, warm, diffuse rash from lupus and eczema. Neurological: no tremor with outstretched hands, Deep tendon reflexes normal in all four extremities.  CMP ( most recent) CMP     Component Value Date/Time   NA  142 05/09/2018 0837   NA 143 01/20/2018 0852   K 3.4 (L) 05/09/2018 0837   CL 103 05/09/2018 0837   CO2 29 05/09/2018 0837   GLUCOSE 84 05/09/2018 0837   BUN 16 05/09/2018 0837   BUN 16 01/20/2018 0852   CREATININE 1.03 05/09/2018 0837   CALCIUM 7.6 (L) 05/09/2018 0837   CALCIUM 7.6 (L) 05/09/2018 0837   CALCIUM 7.8 (L) 12/02/2008 2239   PROT 6.5 05/09/2018 0837   ALBUMIN 3.9 03/08/2017 1137   AST 20 05/09/2018 0837   ALT 18 05/09/2018 0837   ALKPHOS 35 (L) 03/08/2017 1137   BILITOT 0.6 05/09/2018 0837   GFRNONAA 62 05/09/2018 0837   GFRAA 71 05/09/2018 0837     Diabetic Labs (most recent): Lab Results  Component Value Date   HGBA1C 5.7 (H) 12/23/2017   HGBA1C 5.3 12/11/2016   HGBA1C 6.3 (H) 03/10/2016     Lipid Panel ( most recent) Lipid Panel     Component Value Date/Time   CHOL 166 12/23/2017 0738   TRIG 109 12/23/2017 0738   HDL 35 (L) 12/23/2017 0738   CHOLHDL 4.7 12/23/2017 0738   VLDL 18 12/11/2016 0743   LDLCALC 109 (H) 12/23/2017 0738     Assessment & Plan:   1. 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  Not due to hypoparathyroidism.  She has elevated PTH in response to hypocalcemia.  -Her most recent calcium was 7.6 . -She is not symptomatic.  She is known to have hypoalbuminemia.  I advised her to continue  calcium supplement weekly at 1000 mg a day of elemental calcium.  She takes a combination of calcium, vitamin D, and magnesium from over-the-counter sources.   -She is offered screening bone density before next visit.  2. hypothyroidism -Her thyroid function tests are improving, however she would benefit from  higher dose of levothyroxine.   -Discussed and increase her levothyroxine to 75 mcg p.o. every morning.    - We discussed about correct intake of levothyroxine, at fasting, with water, separated by at least 30 minutes from breakfast, and separated by more than 4 hours from calcium, iron, multivitamins, acid reflux medications (PPIs). -Patient is made aware of the fact that thyroid hormone replacement is needed for life, dose to be adjusted by periodic monitoring of thyroid function tests.  3.  Nodular goiter -Her thyroid ultrasound from August 2018 was remarkable for stable isthmus nodule at 1.8 cm and new 1.5 cm nodule on the left lobe with benign and non- suspicious features.  She is offered surveillance thyroid/neck ultrasound before her next visit.  -She will not require intervention at this time.  4.  Type 2 diabetes-controlled with A1c of 5.7%.  Not on medications currently. -She will have repeat A1c on subsequent visits.    - I advised patient to maintain close follow up with Fayrene Helper, MD for primary care needs. Follow up plan: Return in about 6 months (around 11/13/2018) for Follow up with Pre-visit Labs, Thyroid / Neck Ultrasound, Follow up with Bone Density.  Glade Lloyd, MD Phone: [phone]  Fax: [phone]   05/14/2018, 5:15 PM

## 2018-05-14 NOTE — Telephone Encounter (Signed)
Please call Mr Harr at [phone]

## 2018-05-15 ENCOUNTER — Other Ambulatory Visit: Payer: Self-pay | Admitting: Family Medicine

## 2018-05-15 ENCOUNTER — Telehealth: Payer: Self-pay | Admitting: Family Medicine

## 2018-05-15 DIAGNOSIS — I96 Gangrene, not elsewhere classified: Secondary | ICD-10-CM

## 2018-05-15 NOTE — Progress Notes (Signed)
[facility]

## 2018-05-15 NOTE — Telephone Encounter (Signed)
I left a message for the pt to call back after I reviewed Orthopedic note.  I am referring her to her Rheumatologist at [facility]  ASAP  The thought is that the finger is NOT infected but the circulation is compromised to the extent that it the tissue is dying  The treatment options suggested, not necessarily curing,  may interact with her treatment for her heart, so I believe the underlying problem is best tied together and managed through her rheumatologist.  I have entered an urgent referral pls also fax the note from Hand Surgeon to him and see if you can obtain appt or have pt call in thanks

## 2018-05-15 NOTE — Telephone Encounter (Signed)
Spoke with patient and she is aware of the referral plan and that someone would be contacting her with an update as soon as more information about the appt is known

## 2018-05-15 NOTE — Telephone Encounter (Signed)
See previous message

## 2018-05-15 NOTE — Telephone Encounter (Signed)
I spoke with Sabrina [name] and she said that [facility] has been trying to contact us for a week and she is having a hard time with her finger and its turning colors and she can't get anything done for it until we respond. I called and spoke to [name] and they are faxing the last office note and said that the patient was supposed to be reaching out to [place]. I'm confused about what is actually needed on our end and will wait for the office note to be faxed and then give to Dr simpson for review

## 2018-05-15 NOTE — Telephone Encounter (Signed)
See previous updated messages

## 2018-05-15 NOTE — Telephone Encounter (Signed)
Faxed over the notes and request for her to be scheduled asap. She last saw him in August

## 2018-05-15 NOTE — Telephone Encounter (Signed)
I spoke with pt, states sher may have to have [partial amputation of her finger and ortho stated he wanted to know about iV antibiotic, I have no message , she was last seen 1 week ago, will write and have note faxed and attempt to speak with MD, also send for last office note  PLS send for last OV from the MD after I review iot I will likely need to speak to MD

## 2018-05-16 DIAGNOSIS — I5032 Chronic diastolic (congestive) heart failure: Secondary | ICD-10-CM | POA: Diagnosis not present

## 2018-05-16 DIAGNOSIS — I998 Other disorder of circulatory system: Secondary | ICD-10-CM | POA: Diagnosis not present

## 2018-05-16 DIAGNOSIS — M3481 Systemic sclerosis with lung involvement: Secondary | ICD-10-CM | POA: Diagnosis not present

## 2018-05-16 DIAGNOSIS — R234 Changes in skin texture: Secondary | ICD-10-CM | POA: Diagnosis not present

## 2018-05-16 DIAGNOSIS — I96 Gangrene, not elsewhere classified: Secondary | ICD-10-CM | POA: Diagnosis not present

## 2018-05-16 DIAGNOSIS — M349 Systemic sclerosis, unspecified: Secondary | ICD-10-CM | POA: Diagnosis not present

## 2018-05-17 DIAGNOSIS — I998 Other disorder of circulatory system: Secondary | ICD-10-CM | POA: Insufficient documentation

## 2018-05-17 HISTORY — DX: Other disorder of circulatory system: I99.8

## 2018-05-19 MED ORDER — FOLIC ACID 1 MG PO TABS
1.00 | ORAL_TABLET | ORAL | Status: DC
Start: 2018-05-20 — End: 2018-05-19

## 2018-05-19 MED ORDER — AMBRISENTAN 5 MG PO TABS
5.00 | ORAL_TABLET | ORAL | Status: DC
Start: 2018-05-20 — End: 2018-05-19

## 2018-05-19 MED ORDER — CALCIUM CARBONATE-VITAMIN D 600-400 MG-UNIT PO TABS
1.00 | ORAL_TABLET | ORAL | Status: DC
Start: 2018-05-20 — End: 2018-05-19

## 2018-05-19 MED ORDER — ACETAMINOPHEN 325 MG PO TABS
650.00 | ORAL_TABLET | ORAL | Status: DC
Start: ? — End: 2018-05-19

## 2018-05-19 MED ORDER — ASPIRIN EC 81 MG PO TBEC
81.00 | DELAYED_RELEASE_TABLET | ORAL | Status: DC
Start: 2018-05-20 — End: 2018-05-19

## 2018-05-19 MED ORDER — FUROSEMIDE 40 MG PO TABS
40.00 | ORAL_TABLET | ORAL | Status: DC
Start: 2018-05-21 — End: 2018-05-19

## 2018-05-19 MED ORDER — LIDOCAINE HCL 1 % IJ SOLN
0.50 | INTRAMUSCULAR | Status: DC
Start: ? — End: 2018-05-19

## 2018-05-19 MED ORDER — LEVOTHYROXINE SODIUM 50 MCG PO TABS
50.00 | ORAL_TABLET | ORAL | Status: DC
Start: 2018-05-20 — End: 2018-05-19

## 2018-05-19 MED ORDER — OXYCODONE HCL 5 MG PO TABS
5.00 | ORAL_TABLET | ORAL | Status: DC
Start: ? — End: 2018-05-19

## 2018-05-19 MED ORDER — MYCOPHENOLATE SODIUM 180 MG PO TBEC
720.00 | DELAYED_RELEASE_TABLET | ORAL | Status: DC
Start: 2018-05-19 — End: 2018-05-19

## 2018-05-19 MED ORDER — PANTOPRAZOLE SODIUM 20 MG PO TBEC
20.00 | DELAYED_RELEASE_TABLET | ORAL | Status: DC
Start: 2018-05-19 — End: 2018-05-19

## 2018-05-21 ENCOUNTER — Encounter: Payer: Self-pay | Admitting: Family Medicine

## 2018-05-27 ENCOUNTER — Telehealth: Payer: Self-pay | Admitting: Cardiology

## 2018-05-27 NOTE — Telephone Encounter (Signed)
New Message   Pt states she is calling to get an appt with [facility] due to just getting out of the hospital, informed that Curt Bears is booked until 1/6 and offered PA but she prefers to speak to a nurse. Please call

## 2018-05-30 ENCOUNTER — Encounter: Payer: Self-pay | Admitting: Adult Health

## 2018-05-30 ENCOUNTER — Ambulatory Visit (INDEPENDENT_AMBULATORY_CARE_PROVIDER_SITE_OTHER): Payer: 59 | Admitting: Adult Health

## 2018-05-30 VITALS — BP 132/76 | HR 82 | Ht 59.0 in | Wt 193.8 lb

## 2018-05-30 DIAGNOSIS — M359 Systemic involvement of connective tissue, unspecified: Secondary | ICD-10-CM

## 2018-05-30 DIAGNOSIS — I272 Pulmonary hypertension, unspecified: Secondary | ICD-10-CM

## 2018-05-30 DIAGNOSIS — J849 Interstitial pulmonary disease, unspecified: Secondary | ICD-10-CM | POA: Diagnosis not present

## 2018-05-30 DIAGNOSIS — I5032 Chronic diastolic (congestive) heart failure: Secondary | ICD-10-CM | POA: Diagnosis not present

## 2018-05-30 NOTE — Progress Notes (Signed)
@Patient  ID: Sabrina [name], female    DOB: April 13, 1961, 57 y.o.   MRN: [id number]  Chief Complaint  Patient presents with  . Hospitalization Follow-up    Referring provider: Fayrene Helper, MD  HPI: 57 year old female never smoker followed for severe secondary pulmonary hypertension in the setting of connective tissue disease (with scleroderma features). Medical history significant for hypertension, diastolic heart failure, mild obstructive sleep apnea  [facility] 03/22/16  The left ventricular systolic function is normal.  The left ventricular ejection fraction is 55-65% by visual estimate.  Hemodynamic findings consistent with severe pulmonary hypertension.  PASP 114 mmHg Severe right ventricular systolic pressure and pulmonary hypertension with systolic pressures greater than 100 mmHg. Significant pulmonary capillary/LV gradient, without previous evidence of mitral stenosis on echocardiography. Normal coronary arteries. Normal LV systolic function with left ventricular hypertrophy. RECOMMENDATION: The patient will undergo 2-D echo Doppler study later today prior to being discharged. The etiology of her severe pulmonary hypertension is unknown and consider possible primary pulmonary hypertension. Echo Doppler study will be helpful to further assess her mitral valve and potential for RV volume/pressure overload   TTE 09/14/16 >> good LV function, mildly dilated right ventricle, peak PA pressure 36 mm Hg TTE 09/10/17 >> intact left ventricular function, mildly dilated right ventricle, mildly reduced right ventricular function, estimated PASP 52 mmHg  05/30/2018 Follow up : Pulmonary HTN  Patient presents for a post hospital follow-up.  She was recently hospitalized for nonhealing finger ulcer/necrotic ulcer .  He had an extensive work-up.  Says it is had some improvement but is very painful..  Was discharged on nifedipine. Care everywhere notes were reviewed.  2D echo on  May 19, 2018 showed EF at 55%, PA P1 115 mmHg, moderate RV systolic dysfunction mild to moderate TR. Echo shows worsening PAH , Echo in Jan 2019 showed PAP 52 mmHg . She is compliant with Letaris . Denies increased dyspnea . Is not on oxygen.  Patient is followed for severe secondary pulmonary hypertension in setting connective tissue disease) with scleroderma features).  She is followed by rheumatology.  She is on CellCept. She is currently on Lateris  She previously could not tolerate Adcirca or Revatio .  PFTs had showed restrictive changes.  High-resolution CT chest at [facility] April 2019 showed no evidence of lymphadenopathy.  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 Since discharge breathing has been stable.  She denies any increased shortness of breath.. O2 sats 96% on room air .  Says her finger is very painful , having to take pain meds on regular basis .        Allergies  Allergen Reactions  . Peanut-Containing Drug Products Nausea And Vomiting  . Potassium Iodide Other (See Comments)    facial swelling, also eyes, and ears  . Ace Inhibitors Cough  . Codeine Nausea And Vomiting  . Doxycycline Hives  . Peanut Oil Swelling  . Amlodipine Rash  . Latex Hives and Rash  . Penicillins Hives, Itching and Rash    .Marland KitchenHas patient had a PCN reaction causing immediate rash, facial/tongue/throat swelling, SOB or lightheadedness with hypotension: No Has patient had a PCN reaction causing severe rash involving mucus membranes or skin necrosis: No Has patient had a PCN reaction that required hospitalization No Has patient had a PCN reaction occurring within the last 10 years: YES (3 years ago)  If all of the above answers are "NO", then may proceed with Cephalosporin use.  Immunization History  Administered Date(s) Administered  . Influenza Split 06/22/2011, 05/13/2018  . Influenza Whole 07/11/2006  . Influenza,inj,Quad PF,6+ Mos  06/26/2013, 05/11/2014, 06/13/2015, 05/20/2017  . Influenza-Unspecified 06/06/2016  . Pneumococcal Conjugate-13 02/07/2015  . Pneumococcal Polysaccharide-23 06/26/2013  . Td 04/11/2004  . Tdap 05/09/2015    Past Medical History:  Diagnosis Date  . Allergy   . Anemia   . Arthritis   . Asthma   . Chronic bronchitis ([place])   . Connective tissue disease ([place])   . Diabetes mellitus   . Endometriosis   . Fibroid uterus   . GERD (gastroesophageal reflux disease)   . History of Doppler ultrasound    a. Carotid US 8/08:Estimated stenosis in the right and left internal carotid arteries is 0-50% and 0-50%.  . History of echocardiogram    a. Echo 6/16: EF 60-65%, no RWMA.  . History of hiatal hernia   . History of nuclear stress test    a. Myoview 6/16: There is no resting or stress perfusion defect consistent with no prior infarct and no ischemia. EF 73 %  The patient was hypertensive prior and during the study. Cardiac cath 04/2016 showed normal coronary arteries  . Hx of iron deficiency anemia   . Hypersomnia with sleep apnea   . Hypertension   . Lupus ([place]) 07/2013   Of Skin  . Menorrhagia   . Obesity   . Pneumonia   . 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 Secondary hyperthyroidism   . Thyroid disease   . Tinnitus    left ear  . Tubular adenoma of colon   . Vertigo   . Vitamin D deficiency     Tobacco History: Social History   Tobacco Use  Smoking Status Never Smoker  Smokeless Tobacco Never Used   Counseling given: Not Answered   Outpatient Medications Prior to Visit  Medication Sig Dispense Refill  . acetaminophen (TYLENOL) 500 MG tablet Take 500 mg by mouth every 6 (six) hours as needed for headache.    [name] ambrisentan (LETAIRIS) 5 MG tablet     . aspirin EC 81 MG tablet Take 1 tablet (81 mg total) by mouth daily.    . Calcium Citrate-Vitamin D (CALCIUM CITRATE + D PO) Take 500 mg by mouth 2 (two) times daily with a meal. Vit D 800 with magnesium    . ergocalciferol  (VITAMIN D2) 50000 units capsule Take 1 capsule (50,000 Units total) by mouth once a week. One capsule once weekly 12 capsule 1  . furosemide (LASIX) 40 MG tablet Take 40 mg by mouth 3 (three) times a week.     [name] HYDROcodone-acetaminophen (NORCO/VICODIN) 5-325 MG tablet TAKE 1 TABLET BY MOUTH EVERY 4 TO 6 HOURS AS NEEDED FOR PAIN  0  . KLOR-CON 10 10 MEQ tablet TAKE 1 TABLET BY MOUTH TWICE A DAY 60 tablet 3  . levothyroxine (SYNTHROID, LEVOTHROID) 75 MCG tablet Take 1 tablet (75 mcg total) by mouth daily before breakfast. 30 tablet 6  . Multiple Vitamin (MULTIVITAMIN WITH MINERALS) TABS tablet Take 1 tablet by mouth daily.    . mycophenolate (MYFORTIC) 360 MG TBEC EC tablet Take by mouth.    . nitroGLYCERIN (NITROSTAT) 0.4 MG SL tablet Place 0.4 mg under the tongue every 5 (five) minutes as needed for chest pain (3 DOSES MAX).    [name] pantoprazole (PROTONIX) 40 MG tablet Take 30- 60 min before your first and last meals of the day 60 tablet 2  . temazepam (RESTORIL)  15 MG capsule Take 1 capsule (15 mg total) by mouth at bedtime as needed for sleep. 30 capsule 4  . triamcinolone ointment (KENALOG) 0.1 % APPLY AS DIRECTED TO ITCHING SPOTS ON THE BODY DAILY TO TWICE DAILY AS NEEDED. NOT TO FACE.    [name] acetaminophen-codeine (TYLENOL #3) 300-30 MG tablet Take 1 tablet by mouth every 6 (six) hours as needed for moderate pain. (Patient not taking: Reported on 05/30/2018) 20 tablet 0   No facility-administered medications prior to visit.      Review of Systems  Constitutional:   No  weight loss, night sweats,  Fevers, chills,  +fatigue, or  lassitude.  HEENT:   No headaches,  Difficulty swallowing,  Tooth/dental problems, or  Sore throat,                No sneezing, itching, ear ache, nasal congestion, post nasal drip,   CV:  No chest pain,  Orthopnea, PND, swelling in lower extremities, anasarca, dizziness, palpitations, syncope.   GI  No heartburn, indigestion, abdominal pain, nausea, vomiting,  diarrhea, change in bowel habits, loss of appetite, bloody stools.   Resp:    No chest wall deformity  Skin: no rash or lesions.  GU: no dysuria, change in color of urine, no urgency or frequency.  No flank pain, no hematuria   MS:  No joint pain or swelling.  No decreased range of motion.  No back pain.    Physical Exam  BP 132/76 (BP Location: Left Arm, Cuff Size: Normal)   Pulse 82   Ht 4\' 11"  (1.499 m)   Wt 193 lb 12.8 oz (87.9 kg)   LMP 10/31/2002   SpO2 96%   BMI 39.14 kg/m   GEN: A/Ox3; pleasant , NAD, obese    HEENT:  [place]/AT,  EACs-clear, TMs-wnl, NOSE-clear, THROAT-clear, no lesions, no postnasal drip or exudate noted.   NECK:  Supple w/ fair ROM; no JVD; normal carotid impulses w/o bruits; no thyromegaly or nodules palpated; no lymphadenopathy.    RESP  Clear  P & A; w/o, wheezes/ rales/ or rhonchi. no accessory muscle use, no dullness to percussion  CARD:  RRR, no m/r/g, no peripheral edema, pulses intact, no cyanosis or clubbing.  GI:   Soft & nt; nml bowel sounds; no organomegaly or masses detected.   Musco: Warm bil, no deformities or joint swelling noted.   Neuro: alert, no focal deficits noted.    Skin: Warm, no lesions or rashes Right 5th digit with necrotic ulceration noted.  Anterior chest with hyperpigmented bumpy skin .     Lab Results:  CBC    BNP ProBNP No results found for: PROBNP  Imaging: No results found.   Assessment & Plan:   No problem-specific Assessment & Plan notes found for this encounter.     Rexene Edison, NP 05/30/2018

## 2018-05-30 NOTE — Patient Instructions (Signed)
Continue on Letaris daily  Continue on Lasix .  Low salt diet  Follow up with Rheumatology .  Get follow up with Cardiology  Follow up with Dr. Lamonte Sakai  In 2 weeks and As needed   Please contact office for sooner follow up if symptoms do not improve or worsen or seek emergency care

## 2018-06-02 NOTE — Assessment & Plan Note (Addendum)
Patient has secondary pulmonary hypertension secondary connective tissue disease.  Most recent echo shows elevated pulmonary artery pressures that have worsened. Unclear why this has worsened so much since January when PAP 59mmHg. - On Letaris , says compliant  Previous Adcirca and Revatio intolerant .   Will refer to Cards, hold on additional rx for now will discuss on return in 2 weeks . May need to repeat Echo or consider RHC to see if this is the case . Pt is not clinically worse with breathing . Not on oxygen .  Will repeat 6 min walk on return .   Plan  Patient Instructions  Continue on Letaris daily  Continue on Lasix .  Low salt diet  Follow up with Rheumatology .  Get follow up with Cardiology  Follow up with Dr. Lamonte Sakai  In 2 weeks and As needed   Please contact office for sooner follow up if symptoms do not improve or worsen or seek emergency care

## 2018-06-02 NOTE — Assessment & Plan Note (Signed)
Cont on current regimen and follow up with Rheumatology

## 2018-06-02 NOTE — Assessment & Plan Note (Signed)
Appears stable , cont to monitor .

## 2018-06-06 NOTE — Telephone Encounter (Signed)
Left final message to return call

## 2018-06-12 ENCOUNTER — Encounter

## 2018-06-16 IMAGING — NM NM PULMONARY VENT & PERF
16 series · 16 of 16 positions shown · non-contrast
Comparison: Current chest radiograph

CLINICAL DATA: Pt having no symptoms, Pt has chronic thromboembolic
disease

EXAM:
NUCLEAR MEDICINE VENTILATION - PERFUSION LUNG SCAN
TECHNIQUE: Ventilation images were obtained in multiple projections using
inhaled aerosol Yc-33m DTPA. Perfusion images were obtained in
multiple projections after intravenous injection of Yc-33m MAA.
RADIOPHARMACEUTICALS:  31.0 mCi Yechnetium-MMm DTPA aerosol
inhalation and 4.1 mCi Yechnetium-MMm MAA IV

[Series 1: ant/post vent · 4.14mm/px · 1 of 1 slices shown (1 of 2)]
[im 1/1]
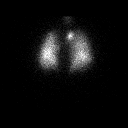

[Series 1: ant/post vent · 4.14mm/px · 1 of 1 slices shown (2 of 2)]
[im 1/1]
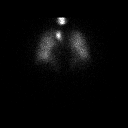

[Series 2: lao/rpo vent · 4.14mm/px · 1 of 1 slices shown (1 of 2)]
[im 1/1]
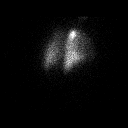

[Series 2: lao/rpo vent · 4.14mm/px · 1 of 1 slices shown (2 of 2)]
[im 1/1]
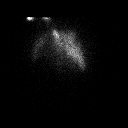

[Series 3: lt lat/rt lat vent · 4.14mm/px · 1 of 1 slices shown (1 of 2)]
[im 1/1]
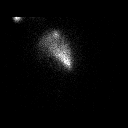

[Series 3: lt lat/rt lat vent · 4.14mm/px · 1 of 1 slices shown (2 of 2)]
[im 1/1]
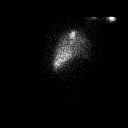

[Series 4: lpo/rao vent · 4.14mm/px · 1 of 1 slices shown (1 of 2)]
[im 1/1]
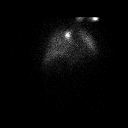

[Series 4: lpo/rao vent · 4.14mm/px · 1 of 1 slices shown (2 of 2)]
[im 1/1]
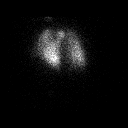

[Series 5: ant/post perf · 4.14mm/px · 1 of 1 slices shown (1 of 2)]
[im 1/1]
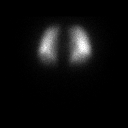

[Series 5: ant/post perf · 4.14mm/px · 1 of 1 slices shown (2 of 2)]
[im 1/1]
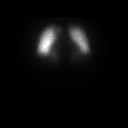

[Series 6: lao/rpo perf · 4.14mm/px · 1 of 1 slices shown (1 of 2)]
[im 1/1]
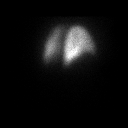

[Series 6: lao/rpo perf · 4.14mm/px · 1 of 1 slices shown (2 of 2)]
[im 1/1]
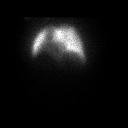

[Series 7: lt lat/rt lat perf · 4.14mm/px · 1 of 1 slices shown (1 of 2)]
[im 1/1]
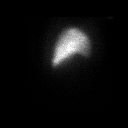

[Series 7: lt lat/rt lat perf · 4.14mm/px · 1 of 1 slices shown (2 of 2)]
[im 1/1]
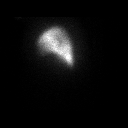

[Series 8: lpo/rao perf · 4.14mm/px · 1 of 1 slices shown (1 of 2)]
[im 1/1]
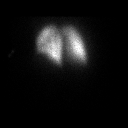

[Series 8: lpo/rao perf · 4.14mm/px · 1 of 1 slices shown (2 of 2)]
[im 1/1]
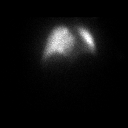

[16 of 16 positions shown; findings below may reference images not displayed]

FINDINGS: Ventilation: No focal ventilation defect.

Perfusion: No wedge shaped peripheral perfusion defects to suggest
acute pulmonary embolism.
IMPRESSION: Normal ventilation-perfusion study. No evidence of a pulmonary
thromboembolism.

## 2018-06-20 DIAGNOSIS — J849 Interstitial pulmonary disease, unspecified: Secondary | ICD-10-CM | POA: Diagnosis not present

## 2018-06-20 DIAGNOSIS — I2721 Secondary pulmonary arterial hypertension: Secondary | ICD-10-CM | POA: Diagnosis not present

## 2018-06-20 DIAGNOSIS — R918 Other nonspecific abnormal finding of lung field: Secondary | ICD-10-CM | POA: Diagnosis not present

## 2018-06-20 DIAGNOSIS — M349 Systemic sclerosis, unspecified: Secondary | ICD-10-CM | POA: Diagnosis not present

## 2018-06-20 DIAGNOSIS — Z79899 Other long term (current) drug therapy: Secondary | ICD-10-CM | POA: Diagnosis not present

## 2018-06-20 DIAGNOSIS — J8489 Other specified interstitial pulmonary diseases: Secondary | ICD-10-CM | POA: Diagnosis not present

## 2018-07-01 ENCOUNTER — Ambulatory Visit (INDEPENDENT_AMBULATORY_CARE_PROVIDER_SITE_OTHER): Payer: 59 | Admitting: Emergency Medicine

## 2018-07-01 ENCOUNTER — Encounter: Payer: Self-pay | Admitting: Emergency Medicine

## 2018-07-01 VITALS — BP 126/80 | HR 82 | Ht 60.0 in | Wt 195.0 lb

## 2018-07-01 DIAGNOSIS — J849 Interstitial pulmonary disease, unspecified: Secondary | ICD-10-CM

## 2018-07-01 DIAGNOSIS — I272 Pulmonary hypertension, unspecified: Secondary | ICD-10-CM | POA: Diagnosis not present

## 2018-07-01 NOTE — Patient Instructions (Addendum)
Your echocardiogram from your October hospitalization showed an increase in your pulmonary pressures.  We will repeat your echocardiogram now at [facility].  If the pressures remain elevated we will need to discuss with cardiology possible repeat right heart catheterization to measure the pressures directly, consider adjusting your pulmonary hypertension medication Repeat 6-minute walk Continue Lateris, nifedipine as you are taking them for now We will obtain a copy of your CT scan of the chest from [facility] to review.  Depending on the inflammatory changes present we may consider other work-up including possible bronchoscopy to evaluate for opportunistic infection Continue mycophenolate as you are taking it.  Agree with discussing possible additional anti-inflammatory medication with Rheumatology and Pulmonology at [facility] Flu shot is up-to-date Follow with Dr Lamonte Sakai in late January after you have had your initial visit with pulmonology at [facility]

## 2018-07-01 NOTE — Progress Notes (Signed)
Subjective:    Patient ID: Sabrina [name], female    DOB: 1961-05-26, 57 y.o.   MRN: [id number]  HPI       ROV 07/01/18 --pleasant 57 year old woman whom I have followed for interstitial lung disease and severe secondary pulmonary hypertension in the setting of connective tissue disease with characteristics consistent with scleroderma.  She also has hypertension, diastolic CHF and mild obstructive sleep apnea.  She is followed by rheumatology at [facility], currently on CellCept (no longer MTX, Plaquanil).  Since last time I saw her she was hospitalized for nonhealing wound on her finger, currently on nifedipine.  She had a right upper extremity angiogram performed at [facility] on 05/18/2018 that did not show any obvious proximal arterial obstruction or narrowing but poor local flow. TTE on 05/19/18 while admitted to [facility] > PASP 115 mmHg (a significant increase). Remains on Letaris She is to see Pulmonology at [facility] (at least once) coming up in January 2020. She is followed by Drs Radford Pax and [place] here in [place].   High-resolution CT scan of her chest was done on 06/20/2018 at [facility], I have the report available.  This showed some groundglass and tree-in-bud opacities in the inferior portion of the right upper lobe, reticulation in the right middle lobe and right lower lobe with some subpleural sparing and unchanged associated bronchiectatic change.  There is a stable 3 mm left lower lobe nodule.  Overall NSIP pattern with little change, new right upper lobe groundglass and tree-in-bud opacities.       PFT done on 06/20/2018 at [facility], FEV1 1.35 L (60% predicted), FVC 1.67 L (62% predicted) FEV1/FVC 98%.  DLCO 9.84 (53% predicted)      She reports stable exertional SOB. She is having increased ankle and calf edema. She has persistent R 5th finger pain.                                                             [facility] 03/22/16  The left ventricular systolic function is normal.  The left ventricular  ejection fraction is 55-65% by visual estimate.  Hemodynamic findings consistent with severe pulmonary hypertension.  PASP 114 mmHg Severe right ventricular systolic pressure and pulmonary hypertension with systolic pressures greater than 100 mmHg. Significant pulmonary capillary/LV gradient, without previous evidence of mitral stenosis on echocardiography. Normal coronary arteries. Normal LV systolic function with left ventricular hypertrophy. RECOMMENDATION: The patient will undergo 2-D echo Doppler study later today prior to being discharged. The etiology of her severe pulmonary hypertension is unknown and consider possible primary pulmonary hypertension. Echo Doppler study will be helpful to further assess her mitral valve and potential for RV volume/pressure overload   TTE 09/14/16 >> good LV function, mildly dilated right ventricle, peak PA pressure 36 mm Hg TTE 09/10/17 >> intact left ventricular function, mildly dilated right ventricle, mildly reduced right ventricular function, estimated PASP 52 mmHg   Review of Systems  Constitutional: Negative.  Negative for fever and unexpected weight change.  HENT: Positive for sinus pressure and sneezing. Negative for congestion, dental problem, ear pain, nosebleeds, postnasal drip, rhinorrhea, sore throat and trouble swallowing.   Eyes: Positive for itching. Negative for redness.  Respiratory: Positive for cough, shortness of breath and wheezing. Negative for chest tightness.   Cardiovascular: Negative.  Negative for  palpitations and leg swelling.  Gastrointestinal: Negative.  Negative for nausea and vomiting.  Endocrine: Negative.   Genitourinary: Negative.  Negative for dysuria.  Musculoskeletal: Negative.  Negative for joint swelling.  Skin: Negative.  Negative for rash.  Allergic/Immunologic: Positive for environmental allergies.  Neurological: Negative.  Negative for headaches.  Hematological: Bruises/bleeds easily.    Psychiatric/Behavioral: Negative.  Negative for dysphoric mood. The patient is not nervous/anxious.        Objective:   Physical Exam Vitals:   07/01/18 0914  BP: 126/80  Pulse: 82  SpO2: 92%  Weight: 195 lb (88.5 kg)  Height: 5' (1.524 m)   Gen: Pleasant, overwt woman, in no distress,  normal affect  ENT: No lesions,  mouth clear,  oropharynx clear, no postnasal drip  Neck: No JVD, no stridor  Lungs: No use of accessory muscles, clear without rales or rhonchi  Cardiovascular: RRR, S3+ versus a split S2, S4+, no M, trace to 1+ LE edema  Musculoskeletal: No deformities, no cyanosis or clubbing  Neuro: alert, non focal  Skin: Warm, hyperpigmentation and some skin thickening on bilateral lower extremities. Also some hyperpigmentation on the chest and back.  Her right fifth digit shows distal tip necrotic change, dry gangrene.  Beginning to show some bone      Assessment & Plan:  Pulmonary hypertension ([place]) PAH in the setting of connective tissue disease and interstitial lung disease.  Concerned based on her most recent echocardiogram that her PASP has increased on her Letaris.  This was done while she was in the hospital.  I think we need to repeat her echocardiogram, repeat her 6-minute walk and if either show evidence for progression of her PAH then arrange for right heart catheterization.  She has not tolerated PDE 5 inhibitors, may need to progress to inhaled or IV prostacyclin therapy if her PASP is truly increased to the degree suggested by her echo.  I will need to coordinate this with [facility] if indicated.  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  I do not have the films here to review but will obtain them.  Certainly she is at risk for possible opportunistic infection.  She may need bronchoscopy for culture  data.  Likewise she is at risk for progression of her interstitial disease and if the films are more consistent with this then she may need adjustment of her immunosuppression regimen.  She will likely need culture data before we would feel comfortable increasing her immunosuppression.  I will try to coordinate this with pulmonology at [facility].  She is to see them in January.  Baltazar Apo, MD, PhD 07/01/2018, 12:44 PM [facility] [phone] or if no answer [phone]

## 2018-07-01 NOTE — Assessment & Plan Note (Signed)
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  I do not have the films here to review but will obtain them.  Certainly she is at risk for possible opportunistic infection.  She may need bronchoscopy for culture data.  Likewise she is at risk for progression of her interstitial disease and if the films are more consistent with this then she may need adjustment of her immunosuppression regimen.  She will likely need culture data before we would feel comfortable increasing her immunosuppression.  I will try to coordinate this with pulmonology at [facility].  She is to see them in January.

## 2018-07-01 NOTE — Assessment & Plan Note (Signed)
PAH in the setting of connective tissue disease and interstitial lung disease.  Concerned based on her most recent echocardiogram that her PASP has increased on her Letaris.  This was done while she was in the hospital.  I think we need to repeat her echocardiogram, repeat her 6-minute walk and if either show evidence for progression of her PAH then arrange for right heart catheterization.  She has not tolerated PDE 5 inhibitors, may need to progress to inhaled or IV prostacyclin therapy if her PASP is truly increased to the degree suggested by her echo.  I will need to coordinate this with [facility] if indicated.

## 2018-07-16 ENCOUNTER — Ambulatory Visit (HOSPITAL_COMMUNITY): Payer: 59

## 2018-07-16 ENCOUNTER — Other Ambulatory Visit (HOSPITAL_COMMUNITY): Payer: 59

## 2018-08-11 ENCOUNTER — Telehealth: Payer: Self-pay | Admitting: Emergency Medicine

## 2018-08-11 NOTE — Telephone Encounter (Signed)
Yes please refill 

## 2018-08-11 NOTE — Telephone Encounter (Signed)
Called and spoke with [place].  Ok per Dr. Lamonte Sakai for Myrtis Hopping 5mg  refill.  Nothing further at this time.

## 2018-08-11 NOTE — Telephone Encounter (Signed)
Dr. Lamonte Sakai, please advise if it is okay to refill pt's Letairis 5mg  tablet. Thanks!

## 2018-08-12 ENCOUNTER — Other Ambulatory Visit: Payer: Self-pay | Admitting: *Deleted

## 2018-08-12 MED ORDER — AMBRISENTAN 5 MG PO TABS: 5.0000 mg | ORAL_TABLET | Freq: Every day | ORAL | 11 refills | Status: DC

## 2018-08-19 ENCOUNTER — Other Ambulatory Visit: Payer: Self-pay

## 2018-08-19 ENCOUNTER — Encounter: Payer: Self-pay | Admitting: Family Medicine

## 2018-08-19 ENCOUNTER — Telehealth: Payer: Self-pay | Admitting: Emergency Medicine

## 2018-08-19 ENCOUNTER — Telehealth: Payer: Self-pay

## 2018-08-19 ENCOUNTER — Ambulatory Visit (INDEPENDENT_AMBULATORY_CARE_PROVIDER_SITE_OTHER): Payer: 59 | Admitting: Family Medicine

## 2018-08-19 ENCOUNTER — Other Ambulatory Visit (HOSPITAL_COMMUNITY)
Admission: RE | Admit: 2018-08-19 | Discharge: 2018-08-19 | Disposition: A | Discharge status: Home / Self Care | Payer: 59 | Source: Ambulatory Visit | Attending: Family Medicine | Admitting: Family Medicine

## 2018-08-19 ENCOUNTER — Ambulatory Visit (HOSPITAL_COMMUNITY)
Admission: RE | Admit: 2018-08-19 | Discharge: 2018-08-19 | Disposition: A | Discharge status: Home / Self Care | Payer: 59 | Source: Ambulatory Visit | Attending: Family Medicine | Admitting: Family Medicine

## 2018-08-19 VITALS — BP 104/70 | HR 72 | Resp 15 | Ht 60.0 in | Wt 192.0 lb

## 2018-08-19 DIAGNOSIS — R0902 Hypoxemia: Secondary | ICD-10-CM | POA: Diagnosis not present

## 2018-08-19 DIAGNOSIS — R9431 Abnormal electrocardiogram [ECG] [EKG]: Secondary | ICD-10-CM

## 2018-08-19 DIAGNOSIS — I5032 Chronic diastolic (congestive) heart failure: Secondary | ICD-10-CM

## 2018-08-19 DIAGNOSIS — I272 Pulmonary hypertension, unspecified: Secondary | ICD-10-CM | POA: Diagnosis not present

## 2018-08-19 DIAGNOSIS — J849 Interstitial pulmonary disease, unspecified: Secondary | ICD-10-CM | POA: Diagnosis not present

## 2018-08-19 DIAGNOSIS — J9811 Atelectasis: Secondary | ICD-10-CM | POA: Diagnosis not present

## 2018-08-19 LAB — COMPREHENSIVE METABOLIC PANEL
ALT: 22 U/L (ref 0–44)
AST: 25 U/L (ref 15–41)
Albumin: 4.1 g/dL (ref 3.5–5.0)
Alkaline Phosphatase: 51 U/L (ref 38–126)
Anion gap: 13 (ref 5–15)
BUN: 22 mg/dL — ABNORMAL HIGH (ref 6–20)
CO2: 28 mmol/L (ref 22–32)
CREATININE: 1.11 mg/dL — AB (ref 0.44–1.00)
Calcium: 7.6 mg/dL — ABNORMAL LOW (ref 8.9–10.3)
Chloride: 101 mmol/L (ref 98–111)
GFR calc Af Amer: >60 mL/min (ref >60)
GFR, EST NON AFRICAN AMERICAN: 56 mL/min — AB (ref >60)
Glucose, Bld: 96 mg/dL (ref 70–99)
Potassium: 3 mmol/L — ABNORMAL LOW (ref 3.5–5.1)
Sodium: 142 mmol/L (ref 135–145)
Total Bilirubin: 0.7 mg/dL (ref 0.3–1.2)
Total Protein: 7.9 g/dL (ref 6.5–8.1)

## 2018-08-19 LAB — CBC
HCT: 34.5 % — ABNORMAL LOW (ref 36.0–46.0)
Hemoglobin: 10.5 g/dL — ABNORMAL LOW (ref 12.0–15.0)
MCH: 26.3 pg (ref 26.0–34.0)
MCHC: 30.4 g/dL (ref 30.0–36.0)
MCV: 86.5 fL (ref 80.0–100.0)
Platelets: 326 10*3/uL (ref 150–400)
RBC: 3.99 MIL/uL (ref 3.87–5.11)
RDW: 14.7 % (ref 11.5–15.5)
WBC: 9.7 10*3/uL (ref 4.0–10.5)
nRBC: 0 % (ref 0.0–0.2)

## 2018-08-19 LAB — TROPONIN I: Troponin I: <0.03 ng/mL (ref <0.03)

## 2018-08-19 LAB — MAGNESIUM: Magnesium: 2.1 mg/dL (ref 1.7–2.4)

## 2018-08-19 MED ORDER — POTASSIUM CHLORIDE ER 10 MEQ PO TBCR: EXTENDED_RELEASE_TABLET | ORAL | 1 refills | Status: DC

## 2018-08-19 NOTE — Telephone Encounter (Signed)
PA attempted on CMM for Letairis 5mg . Return call to [place] once we have a determination of approval. Will route to [place] to follow up.   Pollyann Kennedy (Key: Scheryl Darter)    [facility] is reviewing your PA request. Typically an electronic response will be received within 72 hours. To check for an update later, open this request from your dashboard. You may close this dialog and return to your dashboard to perform other tasks.  Pollyann Kennedy Key: Scheryl Darter - PA Case ID: [id number]

## 2018-08-19 NOTE — Telephone Encounter (Signed)
[place] [facility] states that in order to qualify for oxygen it has to be documented that: The patient o2 falls below 88 with activity and that the pt was placed on oxyegn and the O2 level came back to normal showing the need for oxygen

## 2018-08-19 NOTE — Progress Notes (Addendum)
   Sabrina [name]     MRN: [id number]      DOB: 12-Mar-1961   HPI [name] is here for follow up and re-evaluation of chronic medical conditions, medication management and review of any available recent lab and radiology data.  Preventive health is updated, specifically  Cancer screening and Immunization.   Questions or concerns regarding consultations or procedures which the PT has had in the interim are  addressed. The PT denies any adverse reactions to current medications since the last visit.  2 week h/o increased fatigue and poor exercise tolerance exhausted , coughs especially  at night, denies sputum , fever, PND or GERD symptoms C/o poor sleep with bilateral leg pain causing inability to sleep ROS Denies recent fever or chills. Denies sinus pressure, nasal congestion, ear pain or sore throat. Denies chest congestion, productive cough or wheezing.  Denies abdominal pain, nausea, vomiting,diarrhea or constipation.   Denies dysuria, frequency, hesitancy or incontinence. Denies joint pain, swelling and limitation in mobility. Denies headaches, seizures, numbness, or tingling. Denies depression, anxiety or insomnia. Denies skin break down or rash.   PE  BP 118/72   Pulse 72   Resp 15   Ht 5' (1.524 m)   Wt 192 lb (87.1 kg)   LMP 10/31/2002   SpO2 94%   BMI 37.50 kg/m   Patient alert and oriented ill appearing hypoxic with ambulatiuon desats to 84% on room air  HEENT: No facial asymmetry, EOMI,   oropharynx pink and moist.  Neck supple no JVD, no mass.  Chest: Clear to auscultation bilaterally.decreased air entry throughout  CVS: S1, S2 no murmurs, no S3.Regular rate.  ABD: Soft non tender.   Ext: No edema  MS: Adequate ROM spine, shoulders, hips and knees.  Skin: rash noted., varied pigmentation, no ulceration  Psych: Poor  eye contact, flat affect. Memory intact at times  Tearful and  depressed appearing.  CNS: CN 2-12 intact, power,  normal  throughout.no focal deficits noted.   Assessment & Plan  Chronic diastolic heart failure (HCC) Symptoms suggestivbe of decompensation, will obtain cXR and BNP, needs card re eval asap  Pulmonary hypertension ([place]) Reports worsening dyspnea and very poor/ no exercise tolerance in past 3 weeks, in office desats to 84% with activity, needs supplemental oxygen na d re eval with pulmonary and  with card Advised eD iof worsens  Hypoxia Desats to 84% with minimal activity which is new, start oxygen at 2 l/ min,. Patient 's oxygen level returned to 94% once she received supplemental oxygen at 2l/min and also with rest

## 2018-08-19 NOTE — Telephone Encounter (Signed)
Note has been addended.

## 2018-08-19 NOTE — Assessment & Plan Note (Signed)
Reports worsening dyspnea and very poor/ no exercise tolerance in past 3 weeks, in office desats to 84% with activity, needs supplemental oxygen na d re eval with pulmonary and  with card Advised eD iof worsens

## 2018-08-19 NOTE — Patient Instructions (Signed)
F/u in 5 weeks, call if you need me sooner  You need to get a CXr , and labs at the hospital this morning, cBC, chem7 and EGFR, troponin and BNP and magnesium   Since your oxygen falls low with walking you need oxygen at 2l /min, we arew ordering now  I will also be in touch with your cardiologist for urgent eval  If you worsen go directly to the eD

## 2018-08-19 NOTE — Addendum Note (Signed)
Addended by: Eual Fines on: 08/19/2018 01:36 PM   Modules accepted: Orders

## 2018-08-19 NOTE — Assessment & Plan Note (Signed)
Symptoms suggestivbe of decompensation, will obtain cXR and BNP, needs card re eval asap

## 2018-08-19 NOTE — Assessment & Plan Note (Addendum)
Desats to 84% with minimal activity which is new, start oxygen at 2 l/ min,. Patient 's oxygen level returned to 94% once she received supplemental oxygen at 2l/min and also with rest

## 2018-08-19 NOTE — Addendum Note (Signed)
Addended by: Eual Fines on: 08/19/2018 03:00 PM   Modules accepted: Orders

## 2018-08-20 DIAGNOSIS — J849 Interstitial pulmonary disease, unspecified: Secondary | ICD-10-CM | POA: Diagnosis not present

## 2018-08-20 DIAGNOSIS — R0902 Hypoxemia: Secondary | ICD-10-CM | POA: Diagnosis not present

## 2018-08-20 DIAGNOSIS — I5032 Chronic diastolic (congestive) heart failure: Secondary | ICD-10-CM | POA: Diagnosis not present

## 2018-08-20 NOTE — Telephone Encounter (Signed)
Called and left message for pt to call me back to confirm that she received the oxygen

## 2018-08-21 NOTE — Telephone Encounter (Signed)
Medication name and strength: Letairis 5mg  PA approved/denied: Approved Approval dates: 08/19/2018 - 08/20/2019

## 2018-08-22 ENCOUNTER — Telehealth: Payer: Self-pay | Admitting: *Deleted

## 2018-08-22 NOTE — Telephone Encounter (Signed)
Pt just wanted to let you know she does have oxygen set up!

## 2018-08-25 NOTE — Telephone Encounter (Signed)
Pt has received oxygen

## 2018-08-25 NOTE — Telephone Encounter (Signed)
Great. Will let Dr Moshe Cipro know

## 2018-08-28 ENCOUNTER — Other Ambulatory Visit: Payer: Self-pay | Admitting: Family Medicine

## 2018-08-28 ENCOUNTER — Telehealth: Payer: Self-pay | Admitting: *Deleted

## 2018-08-28 MED ORDER — PREDNISONE 10 MG PO TABS: ORAL_TABLET | ORAL | 0 refills | Status: DC

## 2018-08-28 MED ORDER — BENZONATATE 100 MG PO CAPS: 100.0000 mg | ORAL_CAPSULE | Freq: Two times a day (BID) | ORAL | 0 refills | Status: DC | PRN

## 2018-08-28 NOTE — Telephone Encounter (Signed)
Pt called wanting a zpack and some pearls called in for cough. She was saw Tuesday and she still is not feeling any better.

## 2018-08-28 NOTE — Telephone Encounter (Signed)
I spoke directly with the pt, she is being prescribed tessalon perle and short course of prednisone. Has appt with new Pulmonary doc at [facility] in 2weeks, not using oxygen during the daytime

## 2018-08-28 NOTE — Telephone Encounter (Signed)
Please advise 

## 2018-09-09 DIAGNOSIS — J849 Interstitial pulmonary disease, unspecified: Secondary | ICD-10-CM | POA: Diagnosis not present

## 2018-09-09 DIAGNOSIS — I071 Rheumatic tricuspid insufficiency: Secondary | ICD-10-CM | POA: Diagnosis not present

## 2018-09-09 DIAGNOSIS — I270 Primary pulmonary hypertension: Secondary | ICD-10-CM | POA: Diagnosis not present

## 2018-09-09 DIAGNOSIS — I2721 Secondary pulmonary arterial hypertension: Secondary | ICD-10-CM | POA: Diagnosis not present

## 2018-09-09 DIAGNOSIS — Z79899 Other long term (current) drug therapy: Secondary | ICD-10-CM | POA: Diagnosis not present

## 2018-09-09 DIAGNOSIS — R0602 Shortness of breath: Secondary | ICD-10-CM | POA: Diagnosis not present

## 2018-09-09 DIAGNOSIS — I371 Nonrheumatic pulmonary valve insufficiency: Secondary | ICD-10-CM | POA: Diagnosis not present

## 2018-09-15 ENCOUNTER — Telehealth: Payer: Self-pay | Admitting: Family Medicine

## 2018-09-15 DIAGNOSIS — I272 Pulmonary hypertension, unspecified: Secondary | ICD-10-CM

## 2018-09-15 NOTE — Telephone Encounter (Signed)
Pls call and let pt know that I reviewed her Pulmonary visit at [facility], and when tested , she did NOT desat to under 90 % with ambulation, so she needs overnight pulse oximetry to see if she does needs supplemental oxygen at nigh to continue to have the oxygen , pls arrange that test ?? pls ask!

## 2018-09-15 NOTE — Telephone Encounter (Signed)
Called patient and left message for them to return call at the office   

## 2018-09-17 MED ORDER — UNABLE TO FIND: 0 refills | Status: DC

## 2018-09-17 NOTE — Telephone Encounter (Signed)
Faxed  Order to [facility] for overnight oximetry. Pt aware

## 2018-09-17 NOTE — Addendum Note (Signed)
Addended by: Eual Fines on: 09/17/2018 11:57 AM   Modules accepted: Orders

## 2018-09-19 ENCOUNTER — Ambulatory Visit: Payer: 59 | Admitting: Emergency Medicine

## 2018-09-20 DIAGNOSIS — I5032 Chronic diastolic (congestive) heart failure: Secondary | ICD-10-CM | POA: Diagnosis not present

## 2018-09-20 DIAGNOSIS — R0902 Hypoxemia: Secondary | ICD-10-CM | POA: Diagnosis not present

## 2018-09-20 DIAGNOSIS — J849 Interstitial pulmonary disease, unspecified: Secondary | ICD-10-CM | POA: Diagnosis not present

## 2018-09-21 ENCOUNTER — Other Ambulatory Visit: Payer: Self-pay | Admitting: Emergency Medicine

## 2018-09-23 ENCOUNTER — Ambulatory Visit: Payer: 59 | Admitting: Family Medicine

## 2018-09-24 ENCOUNTER — Telehealth: Payer: Self-pay | Admitting: Family Medicine

## 2018-09-24 DIAGNOSIS — J849 Interstitial pulmonary disease, unspecified: Secondary | ICD-10-CM

## 2018-09-24 NOTE — Telephone Encounter (Signed)
Based on updated clinical info, qualifies for nocturnal oxygen, please request this rather than the 24/7 oxygen as record review disqualifies that need when she is stable I have left report in your area I will sign the nEW form Pls also let the pt know

## 2018-09-25 NOTE — Addendum Note (Signed)
Addended by: Eual Fines on: 09/25/2018 09:25 AM   Modules accepted: Orders

## 2018-09-25 NOTE — Telephone Encounter (Signed)
Order for nocturnal o2 faxed to [facility]

## 2018-09-29 NOTE — H&P (View-Only) (Signed)
Cardiology Office Note:    Date:  09/30/2018   ID:  Sabrina [name], Sabrina [name] [birth year], MRN [id number]  PCP:  Sabrina Helper, MD  Cardiologist:  No primary care provider on file.    Referring MD: Sabrina Helper, MD   Chief Complaint  Patient presents with  . Congestive Heart Failure  . Hypertension    History of Present Illness:    Sabrina [name] is a 58 y.o. female with a hx of diastolic CHF, severe pulmonary HTN(PASP 114 mmHg by recent cath),morbid obesity, HTN, HL, GERD, DM2 and normal coronary arteries by cath. Echocardiogram 2017 demonstrated normal LV function, mild diastolic dysfunction, findings consistent with RV pressure and volume overload, trivial MR, no mitral stenosis, mild TR, PASP 91 mmHg, trivial pericardial effusion.  VQ scan was low probability for PE and cath showed normal LVEDP.  Sleep study was negative and no valvular heart dz.  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  She is followed by Sabrina [name] in Pulmonary.    When I saw her 06/2016, she had a prolonged QT interval at 562msec and she was referred to EP.  She was last seen by Sabrina [name] in June 2019 and QT was prolonged again.  She was started on Lopressor 12.5mg  BID.    She was seen down at [facility] for her scleroderma and they referred her to a pulmonologist down there because she was having such shortness of breath.  A 2D echocardiogram was done which showed normal LV function with mild LVH, normal LA and normal diastolic function, moderate RV systolic dysfunction, moderate PR and moderate TR.  She also had a positive saline contrast study at rest and after Valsalva indicating possible PFO.  PFTs were also done which showed.  6-minute walk test was mildly below the normal predicted range with adequate oxygenation during exercise without the use of supplemental O2.  At peak exercise heart rate indicated normal  cardiovascular reserve and perceived dyspnea at the end of exercise was absent or mild.  PFTs showed a DLCO 54% predicted, FEV1/FVC 82% predicted.  She was told by the lung doctor that her main problem was her heart and that she needed to come back to see her heart doctor.  She is here today for followup and is having a lot of problems with shortness of breath.  She gets short of breath to the point that she can barely walk across the room according to her husband.  She occasionally has lower extremity edema and says that she does not feel like the Lasix works anymore.  She is currently on 20 mg 3 times daily.  She denies any chest pain or pressure, PND, orthopnea, , dizziness, palpitations or syncope.  A cath 2 years ago showed normal coronary arteries with severe pulmonary hypertension.  It was felt to be who Group 1 secondary to her scleroderma.  She is compliant with her meds and is tolerating meds with no SE.    Past Medical History:  Diagnosis Date  . Allergy   . Anemia   . Arthritis   . Asthma   . Chronic bronchitis ([place])   . Connective tissue disease ([place])   . Diabetes mellitus   . Endometriosis   . Fibroid uterus   . GERD (gastroesophageal reflux disease)   . History of Doppler ultrasound    a. Carotid US 8/08:Estimated stenosis in the right and left internal carotid arteries  is 0-50% and 0-50%.  . History of echocardiogram    a. Echo 6/16: EF 60-65%, no RWMA.  . History of hiatal hernia   . History of nuclear stress test    a. Myoview 6/16: There is no resting or stress perfusion defect consistent with no prior infarct and no ischemia. EF 73 %  The patient was hypertensive prior and during the study. Cardiac cath 04/2016 showed normal coronary arteries  . Hx of iron deficiency anemia   . Hypersomnia with sleep apnea   . Hypertension   . Lupus ([place]) 07/2013   Of Skin  . Menorrhagia   . Obesity   . Pneumonia   . 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 Secondary hyperthyroidism   .  Thyroid disease   . Tinnitus    left ear  . Tubular adenoma of colon   . Vertigo   . Vitamin D deficiency     Past Surgical History:  Procedure Laterality Date  . ABDOMINAL HYSTERECTOMY  2004   supracervical hysterectomy  . BREAST SURGERY  2005   reduction  . BREATH TEK H PYLORI [name] 12/24/2014   Procedure: BREATH TEK H PYLORI;  Surgeon: Sabrina Pickerel, MD;  Location: [facility];  Service: General;  Laterality: [name];  . CARDIAC CATHETERIZATION [name] 03/22/2016   Procedure: Right/Left Heart Cath and Coronary Angiography;  Surgeon: Sabrina Sine, MD;  Location: [facility];  Service: Cardiovascular;  Laterality: [name];  . DILATION AND CURETTAGE OF UTERUS     age 65  . ESOPHAGOGASTRODUODENOSCOPY (EGD) WITH PROPOFOL [name] 10/14/2015   Procedure: ESOPHAGOGASTRODUODENOSCOPY (EGD) WITH PROPOFOL;  Surgeon: Sabrina Essex, MD;  Location: [facility];  Service: Endoscopy;  Laterality: [name];  . LAPAROSCOPIC GASTRIC SLEEVE RESECTION WITH HIATAL HERNIA REPAIR [name] 07/05/2015   Procedure: LAPAROSCOPIC GASTRIC SLEEVE RESECTION WITH HIATAL HERNIA REPAIR, upper endoscopy;  Surgeon: Sabrina Pickerel, MD;  Location: [facility];  Service: General;  Laterality: [name];  . REDUCTION MAMMAPLASTY Bilateral     Current Medications: Current Meds  Medication Sig  . acetaminophen (TYLENOL) 500 MG tablet Take 500 mg by mouth every 6 (six) hours as needed for headache.  [name] acetaminophen-codeine (TYLENOL #3) 300-30 MG tablet Take 1 tablet by mouth every 6 (six) hours as needed for moderate pain.  [name] ambrisentan (LETAIRIS) 5 MG tablet Take 1 tablet (5 mg total) by mouth daily.  [name] aspirin EC 81 MG tablet Take 1 tablet (81 mg total) by mouth daily.  . benzonatate (TESSALON) 100 MG capsule Take 1 capsule (100 mg total) by mouth 2 (two) times daily as needed for cough.  . Calcium Citrate-Vitamin D (CALCIUM CITRATE + D PO) Take 500 mg by mouth 2 (two) times daily with a meal. Vit D 800 with magnesium  . ergocalciferol (VITAMIN D2) 50000  units capsule Take 1 capsule (50,000 Units total) by mouth once a week. One capsule once weekly  . furosemide (LASIX) 20 MG tablet TAKE 3 TABLETS BY MOUTH EVERY DAY  . furosemide (LASIX) 40 MG tablet Take 40 mg by mouth 3 (three) times a week.   . levothyroxine (SYNTHROID, LEVOTHROID) 75 MCG tablet Take 1 tablet (75 mcg total) by mouth daily before breakfast.  . Multiple Vitamin (MULTIVITAMIN WITH MINERALS) TABS tablet Take 1 tablet by mouth daily.  . mycophenolate (MYFORTIC) 360 MG TBEC EC tablet Take by mouth.  [name] NIFEdipine (ADALAT CC) 30 MG 24 hr tablet Take 1 tablet by mouth daily.  . nitroGLYCERIN (NITROSTAT) 0.4 MG SL tablet Place 0.4  mg under the tongue every 5 (five) minutes as needed for chest pain (3 DOSES MAX).  [name] pantoprazole (PROTONIX) 40 MG tablet Take 30- 60 min before your first and last meals of the day  . potassium chloride (KLOR-CON 10) 10 MEQ tablet Take two tablets 3 times daily for 2 days , then two tablets twice daily  . predniSONE (DELTASONE) 10 MG tablet Take one tablet two times daily for 5 days  . temazepam (RESTORIL) 15 MG capsule Take 1 capsule (15 mg total) by mouth at bedtime as needed for sleep.  [name] triamcinolone ointment (KENALOG) 0.1 % APPLY AS DIRECTED TO ITCHING SPOTS ON THE BODY DAILY TO TWICE DAILY AS NEEDED. NOT TO FACE.  [name] UNABLE TO FIND Overnight Pulse Oximetry (to evaluate the need for continued nocturnal oxygen)  DX I27.20     Allergies:   Peanut-containing drug products; Potassium iodide; Ace inhibitors; Codeine; Doxycycline; Peanut oil; Amlodipine; Latex; and Penicillins   Social History   Socioeconomic History  . Marital status: Married    Spouse name: Not on file  . Number of children: Not on file  . Years of education: 39  . Highest education level: Not on file  Occupational History  . Occupation: Therapist, music: Radio broadcast assistant  Social Needs  . Financial resource strain: Not on file  . Food insecurity:     Worry: Not on file    Inability: Not on file  . Transportation needs:    Medical: Not on file    Non-medical: Not on file  Tobacco Use  . Smoking status: Never Smoker  . Smokeless tobacco: Never Used  Substance and Sexual Activity  . Alcohol use: No    Alcohol/week: 0.0 standard drinks  . Drug use: No  . Sexual activity: Yes    Partners: Male    Birth control/protection: Surgical    Comment: hysterectomy (supracervical)  Lifestyle  . Physical activity:    Days per week: Not on file    Minutes per session: Not on file  . Stress: Not on file  Relationships  . Social connections:    Talks on phone: Not on file    Gets together: Not on file    Attends religious service: Not on file    Active member of club or organization: Not on file    Attends meetings of clubs or organizations: Not on file    Relationship status: Not on file  Other Topics Concern  . Not on file  Social History Narrative   Regular exercise-no   Caffeine Use-no      [place] [facility]: Patient lives with her husband. Works for [employer]. No bird or mold exposure. No pets currently.           Family History: The patient's family history includes Arthritis in her father, mother, and sister; Bleeding Disorder in her sister; Breast cancer in her sister; CAD in her mother; Cancer in her father; Colon cancer in her maternal grandfather; Depression in her father, mother, and sister; Diabetes in her father and mother; Hypertension in her father, mother, and sister; Stroke in her father and maternal aunt; Thyroid disease in her sister. There is no history of Heart attack.  ROS:   Please see the history of present illness.    ROS  All other systems reviewed and negative.   EKGs/Labs/Other Studies Reviewed:    The following studies were reviewed today: none  EKG:  EKG is not ordered today.  Recent Labs: 05/09/2018: TSH 3.06 08/19/2018: ALT 22; BUN 22; Creatinine, Ser 1.11; Hemoglobin 10.5;  Magnesium 2.1; Platelets 326; Potassium 3.0; Sodium 142   Recent Lipid Panel    Component Value Date/Time   CHOL 166 12/23/2017 0738   TRIG 109 12/23/2017 0738   HDL 35 (L) 12/23/2017 0738   CHOLHDL 4.7 12/23/2017 0738   VLDL 18 12/11/2016 0743   LDLCALC 109 (H) 12/23/2017 0738    Physical Exam:    VS:  BP 108/64   Pulse 77   Ht 5' (1.524 m)   Wt 200 lb (90.7 kg)   LMP 10/31/2002   SpO2 99%   BMI 39.06 kg/m     Wt Readings from Last 3 Encounters:  09/30/18 200 lb (90.7 kg)  08/19/18 192 lb (87.1 kg)  07/01/18 195 lb (88.5 kg)     GEN:  Well nourished, well developed in no acute distress HEENT: Normal NECK: No JVD; No carotid bruits LYMPHATICS: No lymphadenopathy CARDIAC: RRR, no murmurs, rubs, gallops RESPIRATORY:  Clear to auscultation without rales, wheezing or rhonchi  ABDOMEN: Soft, non-tender, non-distended MUSCULOSKELETAL:  No edema; No deformity  SKIN: Warm and dry NEUROLOGIC:  Alert and oriented x 3 PSYCHIATRIC:  Normal affect   ASSESSMENT:    1. Chronic diastolic heart failure ([place])   2. Pulmonary hypertension ([place])   3. Essential hypertension   4. Prolonged QT syndrome    PLAN:    In order of problems listed above:  1.  Chronic diastolic CHF - she does not appear volume overloaded on exam and weight is stable.  Her last 2D echo showed normal diastolic function and normal LV systolic function.  2.  Severe pulmonary HTN -right left heart cath in 2017 showed severe pulmonary hypertension with a PASP of 100 mmHg and normal coronary arteries.  VQ scan was low probability for PE and cath showed normal LVEDP.  Sleep study was negative and no valvular heart dz.  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  She is followed by Sabrina [name] in Pulmonary.  Her last echo 08/2017 showed moderate pulmonary HTN with PASP 71mmHg.  Her most recent echo at [facility] did not indicate a PASP.  Continue on  Ambrisentan 5mg  daily.  2D echo now shows RV failure likely contributing to her lower extremity edema and suspect her shortness of breath is worsening because of worsening pulmonary hypertension.  She was also found to have a PFO on agitated saline contrast study at [facility] raising the concern for possible PFO/ASD as well.  I have recommended that we repeat a right heart cath to reassess PA pressures.  She may need a second agent added for her pulmonary hypertension.  I have also recommended that during the right heart cath and O2 sat run be done to see if she has significant PFO/ASD.  I am going to change her Lasix to Demadex 20 mg twice daily to see if she absorbs this better.  I have asked her to call me if she is not diuresing well.  Cardiac catheterization was discussed with the patient fully. The patient understands that risks include but are not limited to stroke (1 in 1000), death (1 in 46), kidney failure [usually temporary] (1 in 500), bleeding (1 in 200), allergic reaction [possibly serious] (1 in 200).  The patient understands and is willing to proceed.   .  3.  HTN - BP is controlled on exam today.  Continue on Nifedipine 30mg  daily  4.  Prolonged QTc - her baseline EKG shows normal sinus rhythm with inferior lateral ST abnormality has been present since 2017 and possibly related to RV strain in the setting of severe pulmonary hypertension.  QTC remains prolonged at 530 ms.  It was 546 ms in 2017.  She is seen by EP and will continue on  beta-blockers  I have spent a total of 0 minutes with patient reviewing 40 , telemetry, EKGs, labs and examining patient as well as establishing an assessment and plan that was discussed with the patient.  > 50% of time was spent in direct patient care.     Medication Adjustments/Labs and Tests Ordered: Current medicines are reviewed at length with the patient today.  Concerns regarding medicines are outlined above.  No orders of the defined types were placed  in this encounter.  No orders of the defined types were placed in this encounter.   Signed, Fransico Him, MD  09/30/2018 1:10 PM    Manuel Garcia

## 2018-09-29 NOTE — Progress Notes (Signed)
Cardiology Office Note:    Date:  09/30/2018   ID:  Sabrina [name], Sabrina [name] 08-27-1960, MRN [id number]  PCP:  Fayrene Helper, MD  Cardiologist:  No primary care provider on file.    Referring MD: Fayrene Helper, MD   Chief Complaint  Patient presents with  . Congestive Heart Failure  . Hypertension    History of Present Illness:    Sabrina [name] is a 58 y.o. female with a hx of diastolic CHF, severe pulmonary HTN(PASP 114 mmHg by recent cath),morbid obesity, HTN, HL, GERD, DM2 and normal coronary arteries by cath. Echocardiogram 2017 demonstrated normal LV function, mild diastolic dysfunction, findings consistent with RV pressure and volume overload, trivial MR, no mitral stenosis, mild TR, PASP 91 mmHg, trivial pericardial effusion.  VQ scan was low probability for PE and cath showed normal LVEDP.  Sleep study was negative and no valvular heart dz.  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  She is followed by Dr. Lamonte Sakai in Pulmonary.    When I saw her 06/2016, she had a prolonged QT interval at 59msec and she was referred to EP.  She was last seen by Dr. Curt Bears in June 2019 and QT was prolonged again.  She was started on Lopressor 12.5mg  BID.    She was seen down at [facility] for her scleroderma and they referred her to a pulmonologist down there because she was having such shortness of breath.  A 2D echocardiogram was done which showed normal LV function with mild LVH, normal LA and normal diastolic function, moderate RV systolic dysfunction, moderate PR and moderate TR.  She also had a positive saline contrast study at rest and after Valsalva indicating possible PFO.  PFTs were also done which showed.  6-minute walk test was mildly below the normal predicted range with adequate oxygenation during exercise without the use of supplemental O2.  At peak exercise heart rate indicated normal  cardiovascular reserve and perceived dyspnea at the end of exercise was absent or mild.  PFTs showed a DLCO 54% predicted, FEV1/FVC 82% predicted.  She was told by the lung doctor that her main problem was her heart and that she needed to come back to see her heart doctor.  She is here today for followup and is having a lot of problems with shortness of breath.  She gets short of breath to the point that she can barely walk across the room according to her husband.  She occasionally has lower extremity edema and says that she does not feel like the Lasix works anymore.  She is currently on 20 mg 3 times daily.  She denies any chest pain or pressure, PND, orthopnea, , dizziness, palpitations or syncope.  A cath 2 years ago showed normal coronary arteries with severe pulmonary hypertension.  It was felt to be who Group 1 secondary to her scleroderma.  She is compliant with her meds and is tolerating meds with no SE.    Past Medical History:  Diagnosis Date  . Allergy   . Anemia   . Arthritis   . Asthma   . Chronic bronchitis ([place])   . Connective tissue disease ([place])   . Diabetes mellitus   . Endometriosis   . Fibroid uterus   . GERD (gastroesophageal reflux disease)   . History of Doppler ultrasound    a. Carotid US 8/08:Estimated stenosis in the right and left internal carotid arteries  is 0-50% and 0-50%.  . History of echocardiogram    a. Echo 6/16: EF 60-65%, no RWMA.  . History of hiatal hernia   . History of nuclear stress test    a. Myoview 6/16: There is no resting or stress perfusion defect consistent with no prior infarct and no ischemia. EF 73 %  The patient was hypertensive prior and during the study. Cardiac cath 04/2016 showed normal coronary arteries  . Hx of iron deficiency anemia   . Hypersomnia with sleep apnea   . Hypertension   . Lupus ([place]) 07/2013   Of Skin  . Menorrhagia   . Obesity   . Pneumonia   . 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 Secondary hyperthyroidism   .  Thyroid disease   . Tinnitus    left ear  . Tubular adenoma of colon   . Vertigo   . Vitamin D deficiency     Past Surgical History:  Procedure Laterality Date  . ABDOMINAL HYSTERECTOMY  2004   supracervical hysterectomy  . BREAST SURGERY  2005   reduction  . BREATH TEK H PYLORI [name] 12/24/2014   Procedure: BREATH TEK H PYLORI;  Surgeon: Greer Pickerel, MD;  Location: [facility];  Service: General;  Laterality: [name];  . CARDIAC CATHETERIZATION [name] 03/22/2016   Procedure: Right/Left Heart Cath and Coronary Angiography;  Surgeon: Troy Sine, MD;  Location: [facility];  Service: Cardiovascular;  Laterality: [name];  . DILATION AND CURETTAGE OF UTERUS     age 73  . ESOPHAGOGASTRODUODENOSCOPY (EGD) WITH PROPOFOL [name] 10/14/2015   Procedure: ESOPHAGOGASTRODUODENOSCOPY (EGD) WITH PROPOFOL;  Surgeon: Clarene Essex, MD;  Location: [facility];  Service: Endoscopy;  Laterality: [name];  . LAPAROSCOPIC GASTRIC SLEEVE RESECTION WITH HIATAL HERNIA REPAIR [name] 07/05/2015   Procedure: LAPAROSCOPIC GASTRIC SLEEVE RESECTION WITH HIATAL HERNIA REPAIR, upper endoscopy;  Surgeon: Greer Pickerel, MD;  Location: [facility];  Service: General;  Laterality: [name];  . REDUCTION MAMMAPLASTY Bilateral     Current Medications: Current Meds  Medication Sig  . acetaminophen (TYLENOL) 500 MG tablet Take 500 mg by mouth every 6 (six) hours as needed for headache.  [name] acetaminophen-codeine (TYLENOL #3) 300-30 MG tablet Take 1 tablet by mouth every 6 (six) hours as needed for moderate pain.  [name] ambrisentan (LETAIRIS) 5 MG tablet Take 1 tablet (5 mg total) by mouth daily.  [name] aspirin EC 81 MG tablet Take 1 tablet (81 mg total) by mouth daily.  . benzonatate (TESSALON) 100 MG capsule Take 1 capsule (100 mg total) by mouth 2 (two) times daily as needed for cough.  . Calcium Citrate-Vitamin D (CALCIUM CITRATE + D PO) Take 500 mg by mouth 2 (two) times daily with a meal. Vit D 800 with magnesium  . ergocalciferol (VITAMIN D2) 50000  units capsule Take 1 capsule (50,000 Units total) by mouth once a week. One capsule once weekly  . furosemide (LASIX) 20 MG tablet TAKE 3 TABLETS BY MOUTH EVERY DAY  . furosemide (LASIX) 40 MG tablet Take 40 mg by mouth 3 (three) times a week.   . levothyroxine (SYNTHROID, LEVOTHROID) 75 MCG tablet Take 1 tablet (75 mcg total) by mouth daily before breakfast.  . Multiple Vitamin (MULTIVITAMIN WITH MINERALS) TABS tablet Take 1 tablet by mouth daily.  . mycophenolate (MYFORTIC) 360 MG TBEC EC tablet Take by mouth.  [name] NIFEdipine (ADALAT CC) 30 MG 24 hr tablet Take 1 tablet by mouth daily.  . nitroGLYCERIN (NITROSTAT) 0.4 MG SL tablet Place 0.4  mg under the tongue every 5 (five) minutes as needed for chest pain (3 DOSES MAX).  [name] pantoprazole (PROTONIX) 40 MG tablet Take 30- 60 min before your first and last meals of the day  . potassium chloride (KLOR-CON 10) 10 MEQ tablet Take two tablets 3 times daily for 2 days , then two tablets twice daily  . predniSONE (DELTASONE) 10 MG tablet Take one tablet two times daily for 5 days  . temazepam (RESTORIL) 15 MG capsule Take 1 capsule (15 mg total) by mouth at bedtime as needed for sleep.  [name] triamcinolone ointment (KENALOG) 0.1 % APPLY AS DIRECTED TO ITCHING SPOTS ON THE BODY DAILY TO TWICE DAILY AS NEEDED. NOT TO FACE.  [name] UNABLE TO FIND Overnight Pulse Oximetry (to evaluate the need for continued nocturnal oxygen)  DX I27.20     Allergies:   Peanut-containing drug products; Potassium iodide; Ace inhibitors; Codeine; Doxycycline; Peanut oil; Amlodipine; Latex; and Penicillins   Social History   Socioeconomic History  . Marital status: Married    Spouse name: Not on file  . Number of children: Not on file  . Years of education: 58  . Highest education level: Not on file  Occupational History  . Occupation: Therapist, music: Radio broadcast assistant  Social Needs  . Financial resource strain: Not on file  . Food insecurity:     Worry: Not on file    Inability: Not on file  . Transportation needs:    Medical: Not on file    Non-medical: Not on file  Tobacco Use  . Smoking status: Never Smoker  . Smokeless tobacco: Never Used  Substance and Sexual Activity  . Alcohol use: No    Alcohol/week: 0.0 standard drinks  . Drug use: No  . Sexual activity: Yes    Partners: Male    Birth control/protection: Surgical    Comment: hysterectomy (supracervical)  Lifestyle  . Physical activity:    Days per week: Not on file    Minutes per session: Not on file  . Stress: Not on file  Relationships  . Social connections:    Talks on phone: Not on file    Gets together: Not on file    Attends religious service: Not on file    Active member of club or organization: Not on file    Attends meetings of clubs or organizations: Not on file    Relationship status: Not on file  Other Topics Concern  . Not on file  Social History Narrative   Regular exercise-no   Caffeine Use-no      [place] [facility]: Patient lives with her husband. Works for [employer]. No bird or mold exposure. No pets currently.           Family History: The patient's family history includes Arthritis in her father, mother, and sister; Bleeding Disorder in her sister; Breast cancer in her sister; CAD in her mother; Cancer in her father; Colon cancer in her maternal grandfather; Depression in her father, mother, and sister; Diabetes in her father and mother; Hypertension in her father, mother, and sister; Stroke in her father and maternal aunt; Thyroid disease in her sister. There is no history of Heart attack.  ROS:   Please see the history of present illness.    ROS  All other systems reviewed and negative.   EKGs/Labs/Other Studies Reviewed:    The following studies were reviewed today: none  EKG:  EKG is not ordered today.  Recent Labs: 05/09/2018: TSH 3.06 08/19/2018: ALT 22; BUN 22; Creatinine, Ser 1.11; Hemoglobin 10.5;  Magnesium 2.1; Platelets 326; Potassium 3.0; Sodium 142   Recent Lipid Panel    Component Value Date/Time   CHOL 166 12/23/2017 0738   TRIG 109 12/23/2017 0738   HDL 35 (L) 12/23/2017 0738   CHOLHDL 4.7 12/23/2017 0738   VLDL 18 12/11/2016 0743   LDLCALC 109 (H) 12/23/2017 0738    Physical Exam:    VS:  BP 108/64   Pulse 77   Ht 5' (1.524 m)   Wt 200 lb (90.7 kg)   LMP 10/31/2002   SpO2 99%   BMI 39.06 kg/m     Wt Readings from Last 3 Encounters:  09/30/18 200 lb (90.7 kg)  08/19/18 192 lb (87.1 kg)  07/01/18 195 lb (88.5 kg)     GEN:  Well nourished, well developed in no acute distress HEENT: Normal NECK: No JVD; No carotid bruits LYMPHATICS: No lymphadenopathy CARDIAC: RRR, no murmurs, rubs, gallops RESPIRATORY:  Clear to auscultation without rales, wheezing or rhonchi  ABDOMEN: Soft, non-tender, non-distended MUSCULOSKELETAL:  No edema; No deformity  SKIN: Warm and dry NEUROLOGIC:  Alert and oriented x 3 PSYCHIATRIC:  Normal affect   ASSESSMENT:    1. Chronic diastolic heart failure ([place])   2. Pulmonary hypertension ([place])   3. Essential hypertension   4. Prolonged QT syndrome    PLAN:    In order of problems listed above:  1.  Chronic diastolic CHF - she does not appear volume overloaded on exam and weight is stable.  Her last 2D echo showed normal diastolic function and normal LV systolic function.  2.  Severe pulmonary HTN -right left heart cath in 2017 showed severe pulmonary hypertension with a PASP of 100 mmHg and normal coronary arteries.  VQ scan was low probability for PE and cath showed normal LVEDP.  Sleep study was negative and no valvular heart dz.  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  She is followed by Dr. Lamonte Sakai in Pulmonary.  Her last echo 08/2017 showed moderate pulmonary HTN with PASP 29mmHg.  Her most recent echo at [facility] did not indicate a PASP.  Continue on  Ambrisentan 5mg  daily.  2D echo now shows RV failure likely contributing to her lower extremity edema and suspect her shortness of breath is worsening because of worsening pulmonary hypertension.  She was also found to have a PFO on agitated saline contrast study at [facility] raising the concern for possible PFO/ASD as well.  I have recommended that we repeat a right heart cath to reassess PA pressures.  She may need a second agent added for her pulmonary hypertension.  I have also recommended that during the right heart cath and O2 sat run be done to see if she has significant PFO/ASD.  I am going to change her Lasix to Demadex 20 mg twice daily to see if she absorbs this better.  I have asked her to call me if she is not diuresing well.  Cardiac catheterization was discussed with the patient fully. The patient understands that risks include but are not limited to stroke (1 in 1000), death (1 in 86), kidney failure [usually temporary] (1 in 500), bleeding (1 in 200), allergic reaction [possibly serious] (1 in 200).  The patient understands and is willing to proceed.   .  3.  HTN - BP is controlled on exam today.  Continue on Nifedipine 30mg  daily  4.  Prolonged QTc - her baseline EKG shows normal sinus rhythm with inferior lateral ST abnormality has been present since 2017 and possibly related to RV strain in the setting of severe pulmonary hypertension.  QTC remains prolonged at 530 ms.  It was 546 ms in 2017.  She is seen by EP and will continue on  beta-blockers  I have spent a total of 0 minutes with patient reviewing 40 , telemetry, EKGs, labs and examining patient as well as establishing an assessment and plan that was discussed with the patient.  > 50% of time was spent in direct patient care.     Medication Adjustments/Labs and Tests Ordered: Current medicines are reviewed at length with the patient today.  Concerns regarding medicines are outlined above.  No orders of the defined types were placed  in this encounter.  No orders of the defined types were placed in this encounter.   Signed, Fransico Him, MD  09/30/2018 1:10 PM    [place]

## 2018-09-30 ENCOUNTER — Encounter: Payer: Self-pay | Admitting: Cardiology

## 2018-09-30 ENCOUNTER — Ambulatory Visit: Payer: 59 | Admitting: Cardiology

## 2018-09-30 VITALS — BP 108/64 | HR 77 | Ht 60.0 in | Wt 200.0 lb

## 2018-09-30 DIAGNOSIS — I4581 Long QT syndrome: Secondary | ICD-10-CM | POA: Diagnosis not present

## 2018-09-30 DIAGNOSIS — I10 Essential (primary) hypertension: Secondary | ICD-10-CM | POA: Diagnosis not present

## 2018-09-30 DIAGNOSIS — I272 Pulmonary hypertension, unspecified: Secondary | ICD-10-CM

## 2018-09-30 DIAGNOSIS — I5032 Chronic diastolic (congestive) heart failure: Secondary | ICD-10-CM | POA: Diagnosis not present

## 2018-09-30 MED ORDER — TORSEMIDE 20 MG PO TABS: 20.0000 mg | ORAL_TABLET | Freq: Two times a day (BID) | ORAL | 11 refills | Status: DC

## 2018-09-30 NOTE — Patient Instructions (Addendum)
Medication Instructions:  1) STOP LASIX 2) START DEMADEX 20 mg twice daily  Labwork: TODAY: BMET, CBC  IN ONE WEEK: BMET (you can have this done at the [place] office)  Testing/Procedures: Your provider has requested that you have an echocardiogram in [place]. Echocardiography is a painless test that uses sound waves to create images of your heart. It provides your doctor with information about the size and shape of your heart and how well your heart's chambers and valves are working. This procedure takes approximately one hour. There are no restrictions for this procedure.    Your physician has requested that you have a cardiac catheterization. Cardiac catheterization is used to diagnose and/or treat various heart conditions. Doctors may recommend this procedure for a number of different reasons. The most common reason is to evaluate chest pain. Chest pain can be a symptom of coronary artery disease (CAD), and cardiac catheterization can show whether plaque is narrowing or blocking your heart's arteries. This procedure is also used to evaluate the valves, as well as measure the blood flow and oxygen levels in different parts of your heart. For further information please visit [facility]. Please follow instruction sheet, as given.  Follow-Up: You will be called to arrange follow-up when your cath is scheduled.   Any Other Special Instructions Will Be Listed Below (If Applicable). CATHETERIZATION INSTRUCTIONS:  You will be called to schedule your RIGHT HEART CATHETERIZATION with Dr. Aundra Dubin.  1. You will arrive at the [facility] (Main Entrance A) at [facility]: [address] [place], [place] [address]. You will be told the time and date to arrive. Special note: Every effort is made to have your procedure done on time. Please understand that emergencies sometimes delay scheduled procedures.  2. Diet: Do not eat solid foods after midnight.  The patient may have clear  liquids until 5am upon the day of the procedure.  3. Labs: TODAY.  4. Medication instructions in preparation for your procedure:  1) HOLD DEMADEX the morning of your catheterization  2) Take your other medications as directed with sips of water  5. Plan for one night stay--bring personal belongings. 6. Bring a current list of your medications and current insurance cards. 7. You MUST have a responsible person to drive you home. 8. Someone MUST be with you the first 24 hours after you arrive home or your discharge will be delayed. 9. Please wear clothes that are easy to get on and off and wear slip-on shoes.  Thank you for allowing [place] to care for you!   -- [facility]

## 2018-10-01 ENCOUNTER — Telehealth (HOSPITAL_COMMUNITY): Payer: Self-pay

## 2018-10-01 ENCOUNTER — Telehealth: Payer: Self-pay | Admitting: Cardiology

## 2018-10-01 ENCOUNTER — Encounter (HOSPITAL_COMMUNITY): Payer: Self-pay

## 2018-10-01 ENCOUNTER — Encounter: Payer: Self-pay | Admitting: Cardiology

## 2018-10-01 LAB — CBC WITH DIFFERENTIAL/PLATELET
Basophils Absolute: 0 10*3/uL (ref 0.0–0.2)
Basos: 0 %
EOS (ABSOLUTE): 0.4 10*3/uL (ref 0.0–0.4)
Eos: 4 %
Hematocrit: 31.5 % — ABNORMAL LOW (ref 34.0–46.6)
Hemoglobin: 9.8 g/dL — ABNORMAL LOW (ref 11.1–15.9)
Immature Grans (Abs): 0 10*3/uL (ref 0.0–0.1)
Immature Granulocytes: 0 %
Lymphocytes Absolute: 2.4 10*3/uL (ref 0.7–3.1)
Lymphs: 23 %
MCH: 25.7 pg — AB (ref 26.6–33.0)
MCHC: 31.1 g/dL — ABNORMAL LOW (ref 31.5–35.7)
MCV: 83 fL (ref 79–97)
Monocytes Absolute: 0.8 10*3/uL (ref 0.1–0.9)
Monocytes: 8 %
Neutrophils Absolute: 6.7 10*3/uL (ref 1.4–7.0)
Neutrophils: 65 %
Platelets: 341 10*3/uL (ref 150–450)
RBC: 3.81 x10E6/uL (ref 3.77–5.28)
RDW: 13.8 % (ref 11.7–15.4)
WBC: 10.5 10*3/uL (ref 3.4–10.8)

## 2018-10-01 LAB — BASIC METABOLIC PANEL
BUN/Creatinine Ratio: 15 (ref 9–23)
BUN: 23 mg/dL (ref 6–24)
CO2: 28 mmol/L (ref 20–29)
Calcium: 6.5 mg/dL — CL (ref 8.7–10.2)
Chloride: 99 mmol/L (ref 96–106)
Creatinine, Ser: 1.49 mg/dL — ABNORMAL HIGH (ref 0.57–1.00)
GFR calc Af Amer: 45 mL/min/{1.73_m2} — ABNORMAL LOW (ref >59)
GFR calc non Af Amer: 39 mL/min/{1.73_m2} — ABNORMAL LOW (ref >59)
Glucose: 87 mg/dL (ref 65–99)
Potassium: 3.5 mmol/L (ref 3.5–5.2)
Sodium: 142 mmol/L (ref 134–144)

## 2018-10-01 NOTE — Telephone Encounter (Signed)
Please get a stat ionized calcium - her albumin may be low given a falsely low Ca level

## 2018-10-01 NOTE — Telephone Encounter (Signed)
LM for patient at home number to call office back.  Per MD, pt to be scheduled for [facility].

## 2018-10-01 NOTE — Telephone Encounter (Signed)
Spoke with the patient, advised her of her low level and offered many ideas to increase her level. Sent a message to Dr. Radford Pax. The patient had no further questions.

## 2018-10-01 NOTE — Telephone Encounter (Signed)
Duplicate encounter

## 2018-10-01 NOTE — Telephone Encounter (Signed)
Pt's Calcium is 6.5 from yesterday   Trey Sailors RN  Dr Theodosia Blender nurse aware .Adonis Housekeeper

## 2018-10-01 NOTE — Telephone Encounter (Signed)
[facility] calling with critical result

## 2018-10-02 NOTE — Telephone Encounter (Signed)
Received info that the patient had a recent echo at [facility].

## 2018-10-02 NOTE — Addendum Note (Signed)
Addended by: Sarina Ill on: 10/02/2018 09:53 AM   Modules accepted: Orders

## 2018-10-02 NOTE — Telephone Encounter (Signed)
Spoke with the patient, she is going to have her calcium drawn at [facility].

## 2018-10-02 NOTE — Telephone Encounter (Signed)
No echo needed   Sabrina [name]

## 2018-10-03 ENCOUNTER — Encounter (HOSPITAL_COMMUNITY): Payer: Self-pay

## 2018-10-03 ENCOUNTER — Telehealth (HOSPITAL_COMMUNITY): Payer: Self-pay

## 2018-10-03 ENCOUNTER — Other Ambulatory Visit (HOSPITAL_COMMUNITY): Payer: Self-pay

## 2018-10-03 DIAGNOSIS — I5032 Chronic diastolic (congestive) heart failure: Secondary | ICD-10-CM

## 2018-10-03 NOTE — Telephone Encounter (Signed)
Pt scheduled for Right Heart Cath with Dr. Aundra Dubin, pt given all instructions, questions answered. Pt will have labs drawn at preexisting lab appointment on 10/07/18 at [facility].  Letter with instructions mailed. Pt states understanding of all information.

## 2018-10-07 ENCOUNTER — Other Ambulatory Visit (HOSPITAL_COMMUNITY)
Admission: RE | Admit: 2018-10-07 | Discharge: 2018-10-07 | Disposition: A | Discharge status: Home / Self Care | Payer: 59 | Source: Ambulatory Visit | Attending: Cardiology | Admitting: Cardiology

## 2018-10-07 ENCOUNTER — Ambulatory Visit (HOSPITAL_COMMUNITY)
Admission: RE | Admit: 2018-10-07 | Discharge: 2018-10-07 | Disposition: A | Discharge status: Home / Self Care | Payer: 59 | Source: Ambulatory Visit | Attending: Cardiology | Admitting: Cardiology

## 2018-10-07 DIAGNOSIS — I5032 Chronic diastolic (congestive) heart failure: Secondary | ICD-10-CM

## 2018-10-07 DIAGNOSIS — I272 Pulmonary hypertension, unspecified: Secondary | ICD-10-CM

## 2018-10-07 LAB — PROTIME-INR
INR: 1.2 (ref 0.8–1.2)
Prothrombin Time: 14.7 seconds (ref 11.4–15.2)

## 2018-10-07 LAB — CBC
HCT: 33.1 % — ABNORMAL LOW (ref 36.0–46.0)
Hemoglobin: 9.8 g/dL — ABNORMAL LOW (ref 12.0–15.0)
MCH: 24.8 pg — ABNORMAL LOW (ref 26.0–34.0)
MCHC: 29.6 g/dL — ABNORMAL LOW (ref 30.0–36.0)
MCV: 83.8 fL (ref 80.0–100.0)
Platelets: 344 10*3/uL (ref 150–400)
RBC: 3.95 MIL/uL (ref 3.87–5.11)
RDW: 15.1 % (ref 11.5–15.5)
WBC: 9.1 10*3/uL (ref 4.0–10.5)
nRBC: 0 % (ref 0.0–0.2)

## 2018-10-07 LAB — BASIC METABOLIC PANEL
Anion gap: 13 (ref 5–15)
BUN: 29 mg/dL — ABNORMAL HIGH (ref 6–20)
CO2: 29 mmol/L (ref 22–32)
Calcium: 6.9 mg/dL — ABNORMAL LOW (ref 8.9–10.3)
Chloride: 101 mmol/L (ref 98–111)
Creatinine, Ser: 1.48 mg/dL — ABNORMAL HIGH (ref 0.44–1.00)
GFR calc Af Amer: 46 mL/min — ABNORMAL LOW (ref >60)
GFR calc non Af Amer: 39 mL/min — ABNORMAL LOW (ref >60)
Glucose, Bld: 89 mg/dL (ref 70–99)
POTASSIUM: 3.1 mmol/L — AB (ref 3.5–5.1)
Sodium: 143 mmol/L (ref 135–145)

## 2018-10-07 NOTE — Progress Notes (Signed)
Spoke with Sabrina [name] at [place] office, per [facility] patient does not need repeat echo.

## 2018-10-08 LAB — CALCIUM, IONIZED: Calcium, Ionized, Serum: 3.7 mg/dL — ABNORMAL LOW (ref 4.5–5.6)

## 2018-10-10 ENCOUNTER — Telehealth: Payer: Self-pay

## 2018-10-10 NOTE — Telephone Encounter (Signed)
Spoke with the patient, she did not want to have the labs drawn. She wanted to have her heart cath first before she decided on her labs. She will reach out to the office.

## 2018-10-10 NOTE — Telephone Encounter (Signed)
-----   Message from Sueanne Margarita, MD sent at 10/08/2018  7:39 PM EST ----- Ionized calcium low.  Please check a PTH level, vit D level 25(OH)D and 1,25(OH)2D levels.

## 2018-10-13 ENCOUNTER — Other Ambulatory Visit: Payer: Self-pay

## 2018-10-13 ENCOUNTER — Encounter (HOSPITAL_COMMUNITY): Payer: Self-pay | Admitting: Cardiology

## 2018-10-13 ENCOUNTER — Encounter (HOSPITAL_COMMUNITY)
Admission: RE | Disposition: A | Discharge status: Home / Self Care | Payer: Self-pay | Source: Home / Self Care | Attending: Cardiology

## 2018-10-13 ENCOUNTER — Telehealth (HOSPITAL_COMMUNITY): Payer: Self-pay | Admitting: Cardiology

## 2018-10-13 ENCOUNTER — Ambulatory Visit (HOSPITAL_COMMUNITY)
Admission: RE | Admit: 2018-10-13 | Discharge: 2018-10-13 | Disposition: A | Discharge status: Home / Self Care | Payer: 59 | Attending: Cardiology | Admitting: Cardiology

## 2018-10-13 DIAGNOSIS — G4710 Hypersomnia, unspecified: Secondary | ICD-10-CM | POA: Insufficient documentation

## 2018-10-13 DIAGNOSIS — Z885 Allergy status to narcotic agent status: Secondary | ICD-10-CM | POA: Insufficient documentation

## 2018-10-13 DIAGNOSIS — Z7982 Long term (current) use of aspirin: Secondary | ICD-10-CM | POA: Diagnosis not present

## 2018-10-13 DIAGNOSIS — Z90710 Acquired absence of both cervix and uterus: Secondary | ICD-10-CM | POA: Diagnosis not present

## 2018-10-13 DIAGNOSIS — I5032 Chronic diastolic (congestive) heart failure: Secondary | ICD-10-CM

## 2018-10-13 DIAGNOSIS — E119 Type 2 diabetes mellitus without complications: Secondary | ICD-10-CM | POA: Insufficient documentation

## 2018-10-13 DIAGNOSIS — I2721 Secondary pulmonary arterial hypertension: Secondary | ICD-10-CM | POA: Diagnosis not present

## 2018-10-13 DIAGNOSIS — Z823 Family history of stroke: Secondary | ICD-10-CM | POA: Insufficient documentation

## 2018-10-13 DIAGNOSIS — I272 Pulmonary hypertension, unspecified: Secondary | ICD-10-CM | POA: Diagnosis not present

## 2018-10-13 DIAGNOSIS — Z833 Family history of diabetes mellitus: Secondary | ICD-10-CM | POA: Insufficient documentation

## 2018-10-13 DIAGNOSIS — Z79899 Other long term (current) drug therapy: Secondary | ICD-10-CM | POA: Diagnosis not present

## 2018-10-13 DIAGNOSIS — Z8249 Family history of ischemic heart disease and other diseases of the circulatory system: Secondary | ICD-10-CM | POA: Insufficient documentation

## 2018-10-13 DIAGNOSIS — Z91040 Latex allergy status: Secondary | ICD-10-CM | POA: Diagnosis not present

## 2018-10-13 DIAGNOSIS — G4730 Sleep apnea, unspecified: Secondary | ICD-10-CM | POA: Insufficient documentation

## 2018-10-13 DIAGNOSIS — Z79890 Hormone replacement therapy (postmenopausal): Secondary | ICD-10-CM | POA: Insufficient documentation

## 2018-10-13 DIAGNOSIS — K219 Gastro-esophageal reflux disease without esophagitis: Secondary | ICD-10-CM | POA: Insufficient documentation

## 2018-10-13 DIAGNOSIS — M329 Systemic lupus erythematosus, unspecified: Secondary | ICD-10-CM | POA: Diagnosis not present

## 2018-10-13 DIAGNOSIS — R9431 Abnormal electrocardiogram [ECG] [EKG]: Secondary | ICD-10-CM | POA: Diagnosis not present

## 2018-10-13 DIAGNOSIS — E079 Disorder of thyroid, unspecified: Secondary | ICD-10-CM | POA: Insufficient documentation

## 2018-10-13 DIAGNOSIS — Z6839 Body mass index (BMI) 39.0-39.9, adult: Secondary | ICD-10-CM | POA: Insufficient documentation

## 2018-10-13 DIAGNOSIS — Z880 Allergy status to penicillin: Secondary | ICD-10-CM | POA: Insufficient documentation

## 2018-10-13 DIAGNOSIS — I110 Hypertensive heart disease with heart failure: Secondary | ICD-10-CM | POA: Insufficient documentation

## 2018-10-13 DIAGNOSIS — M1990 Unspecified osteoarthritis, unspecified site: Secondary | ICD-10-CM | POA: Diagnosis not present

## 2018-10-13 DIAGNOSIS — Z881 Allergy status to other antibiotic agents status: Secondary | ICD-10-CM | POA: Diagnosis not present

## 2018-10-13 DIAGNOSIS — Z8262 Family history of osteoporosis: Secondary | ICD-10-CM | POA: Insufficient documentation

## 2018-10-13 DIAGNOSIS — Z888 Allergy status to other drugs, medicaments and biological substances status: Secondary | ICD-10-CM | POA: Insufficient documentation

## 2018-10-13 DIAGNOSIS — M349 Systemic sclerosis, unspecified: Secondary | ICD-10-CM | POA: Insufficient documentation

## 2018-10-13 HISTORY — PX: RIGHT HEART CATH: CATH118263

## 2018-10-13 LAB — POCT I-STAT EG7
ACID-BASE EXCESS: 8 mmol/L — AB (ref 0.0–2.0)
Acid-Base Excess: 7 mmol/L — ABNORMAL HIGH (ref 0.0–2.0)
Acid-Base Excess: 7 mmol/L — ABNORMAL HIGH (ref 0.0–2.0)
Acid-Base Excess: 7 mmol/L — ABNORMAL HIGH (ref 0.0–2.0)
Acid-Base Excess: 8 mmol/L — ABNORMAL HIGH (ref 0.0–2.0)
Acid-Base Excess: 9 mmol/L — ABNORMAL HIGH (ref 0.0–2.0)
Bicarbonate: 30.7 mmol/L — ABNORMAL HIGH (ref 20.0–28.0)
Bicarbonate: 30.8 mmol/L — ABNORMAL HIGH (ref 20.0–28.0)
Bicarbonate: 31.1 mmol/L — ABNORMAL HIGH (ref 20.0–28.0)
Bicarbonate: 31.8 mmol/L — ABNORMAL HIGH (ref 20.0–28.0)
Bicarbonate: 32.3 mmol/L — ABNORMAL HIGH (ref 20.0–28.0)
Bicarbonate: 33 mmol/L — ABNORMAL HIGH (ref 20.0–28.0)
Calcium, Ion: 0.74 mmol/L — CL (ref 1.15–1.40)
Calcium, Ion: 0.75 mmol/L — CL (ref 1.15–1.40)
Calcium, Ion: 0.75 mmol/L — CL (ref 1.15–1.40)
Calcium, Ion: 0.76 mmol/L — CL (ref 1.15–1.40)
Calcium, Ion: 0.76 mmol/L — CL (ref 1.15–1.40)
Calcium, Ion: 0.77 mmol/L — CL (ref 1.15–1.40)
HCT: 29 % — ABNORMAL LOW (ref 36.0–46.0)
HCT: 30 % — ABNORMAL LOW (ref 36.0–46.0)
HCT: 30 % — ABNORMAL LOW (ref 36.0–46.0)
HCT: 30 % — ABNORMAL LOW (ref 36.0–46.0)
HCT: 30 % — ABNORMAL LOW (ref 36.0–46.0)
HEMATOCRIT: 30 % — AB (ref 36.0–46.0)
HEMOGLOBIN: 10.2 g/dL — AB (ref 12.0–15.0)
Hemoglobin: 10.2 g/dL — ABNORMAL LOW (ref 12.0–15.0)
Hemoglobin: 10.2 g/dL — ABNORMAL LOW (ref 12.0–15.0)
Hemoglobin: 10.2 g/dL — ABNORMAL LOW (ref 12.0–15.0)
Hemoglobin: 10.2 g/dL — ABNORMAL LOW (ref 12.0–15.0)
Hemoglobin: 9.9 g/dL — ABNORMAL LOW (ref 12.0–15.0)
O2 Saturation: 54 %
O2 Saturation: 55 %
O2 Saturation: 57 %
O2 Saturation: 59 %
O2 Saturation: 63 %
O2 Saturation: 64 %
PO2 VEN: 28 mmHg — AB (ref 32.0–45.0)
POTASSIUM: 2.9 mmol/L — AB (ref 3.5–5.1)
POTASSIUM: 3 mmol/L — AB (ref 3.5–5.1)
Potassium: 2.9 mmol/L — ABNORMAL LOW (ref 3.5–5.1)
Potassium: 2.9 mmol/L — ABNORMAL LOW (ref 3.5–5.1)
Potassium: 3 mmol/L — ABNORMAL LOW (ref 3.5–5.1)
Potassium: 3 mmol/L — ABNORMAL LOW (ref 3.5–5.1)
Sodium: 143 mmol/L (ref 135–145)
Sodium: 143 mmol/L (ref 135–145)
Sodium: 143 mmol/L (ref 135–145)
Sodium: 143 mmol/L (ref 135–145)
Sodium: 143 mmol/L (ref 135–145)
Sodium: 144 mmol/L (ref 135–145)
TCO2: 32 mmol/L (ref 22–32)
TCO2: 34 mmol/L — AB (ref 22–32)
TCO2: 34 mmol/L — AB (ref 22–32)
TCO2: 32 mmol/L (ref 22–32)
TCO2: 32 mmol/L (ref 22–32)
TCO2: 33 mmol/L — ABNORMAL HIGH (ref 22–32)
pCO2, Ven: 39.2 mmHg — ABNORMAL LOW (ref 44.0–60.0)
pCO2, Ven: 39.9 mmHg — ABNORMAL LOW (ref 44.0–60.0)
pCO2, Ven: 40.8 mmHg — ABNORMAL LOW (ref 44.0–60.0)
pCO2, Ven: 41.7 mmHg — ABNORMAL LOW (ref 44.0–60.0)
pCO2, Ven: 43.3 mmHg — ABNORMAL LOW (ref 44.0–60.0)
pCO2, Ven: 43.3 mmHg — ABNORMAL LOW (ref 44.0–60.0)
pH, Ven: 7.481 — ABNORMAL HIGH (ref 7.250–7.430)
pH, Ven: 7.49 — ABNORMAL HIGH (ref 7.250–7.430)
pH, Ven: 7.49 — ABNORMAL HIGH (ref 7.250–7.430)
pH, Ven: 7.491 — ABNORMAL HIGH (ref 7.250–7.430)
pH, Ven: 7.495 — ABNORMAL HIGH (ref 7.250–7.430)
pH, Ven: 7.502 — ABNORMAL HIGH (ref 7.250–7.430)
pO2, Ven: 26 mmHg — CL (ref 32.0–45.0)
pO2, Ven: 27 mmHg — CL (ref 32.0–45.0)
pO2, Ven: 28 mmHg — CL (ref 32.0–45.0)
pO2, Ven: 30 mmHg — CL (ref 32.0–45.0)
pO2, Ven: 31 mmHg — CL (ref 32.0–45.0)

## 2018-10-13 LAB — POTASSIUM: Potassium: 2.5 mmol/L — CL (ref 3.5–5.1)

## 2018-10-13 LAB — GLUCOSE, CAPILLARY: Glucose-Capillary: 78 mg/dL (ref 70–99)

## 2018-10-13 SURGERY — RIGHT HEART CATH
Anesthesia: LOCAL

## 2018-10-13 MED ORDER — LIDOCAINE HCL (PF) 1 % IJ SOLN
INTRAMUSCULAR | Status: AC
  Filled 2018-10-13: qty 30

## 2018-10-13 MED ORDER — POTASSIUM CHLORIDE CRYS ER 20 MEQ PO TBCR
60.0000 meq | EXTENDED_RELEASE_TABLET | Freq: Once | ORAL | Status: AC
  Administered 2018-10-13: 60 meq via ORAL
  Filled 2018-10-13: qty 3

## 2018-10-13 MED ORDER — LIDOCAINE HCL (PF) 1 % IJ SOLN
INTRAMUSCULAR | Status: DC | PRN
  Administered 2018-10-13: 2 mL

## 2018-10-13 MED ORDER — SODIUM CHLORIDE 0.9 % IV SOLN: 250.0000 mL | INTRAVENOUS | Status: DC | PRN

## 2018-10-13 MED ORDER — HEPARIN (PORCINE) IN NACL 1000-0.9 UT/500ML-% IV SOLN
INTRAVENOUS | Status: AC
  Filled 2018-10-13: qty 500

## 2018-10-13 MED ORDER — SODIUM CHLORIDE 0.9% FLUSH: 3.0000 mL | INTRAVENOUS | Status: DC | PRN

## 2018-10-13 MED ORDER — HEPARIN (PORCINE) IN NACL 1000-0.9 UT/500ML-% IV SOLN
INTRAVENOUS | Status: DC | PRN
  Administered 2018-10-13: 500 mL

## 2018-10-13 MED ORDER — SODIUM CHLORIDE 0.9 % IV SOLN: INTRAVENOUS | Status: DC

## 2018-10-13 MED ORDER — SODIUM CHLORIDE 0.9% FLUSH: 3.0000 mL | Freq: Two times a day (BID) | INTRAVENOUS | Status: DC

## 2018-10-13 MED ORDER — ACETAMINOPHEN 325 MG PO TABS: 650.0000 mg | ORAL_TABLET | ORAL | Status: DC | PRN

## 2018-10-13 MED ORDER — ONDANSETRON HCL 4 MG/2ML IJ SOLN: 4.0000 mg | Freq: Four times a day (QID) | INTRAMUSCULAR | Status: DC | PRN

## 2018-10-13 SURGICAL SUPPLY — 7 items
CATH BALLN WEDGE 5F 110CM (CATHETERS) ×1 IMPLANT
GUIDEWIRE .025 260CM (WIRE) ×1 IMPLANT
PACK CARDIAC CATHETERIZATION (CUSTOM PROCEDURE TRAY) ×2 IMPLANT
PROTECTION STATION PRESSURIZED (MISCELLANEOUS) ×2
SHEATH GLIDE SLENDER 4/5FR (SHEATH) ×1 IMPLANT
STATION PROTECTION PRESSURIZED (MISCELLANEOUS) IMPLANT
TRANSDUCER W/STOPCOCK (MISCELLANEOUS) ×2 IMPLANT

## 2018-10-13 NOTE — Interval H&P Note (Signed)
History and Physical Interval Note:  10/13/2018 11:52 AM  Sabrina [name]  has presented today for surgery, with the diagnosis of pulm. hypertention  The various methods of treatment have been discussed with the patient and family. After consideration of risks, benefits and other options for treatment, the patient has consented to  Procedure(s): RIGHT HEART CATH ([name]) as a surgical intervention .  The patient's history has been reviewed, patient examined, no change in status, stable for surgery.  I have reviewed the patient's chart and labs.  Questions were answered to the patient's satisfaction.     Yarieliz Wasser [employer]

## 2018-10-13 NOTE — Telephone Encounter (Signed)
-----   Message from Georgiana Shore, NP sent at 10/13/2018  1:50 PM EST ----- Regarding: Sabrina [name] cath patient Hey guys,  Dr Sabrina [name] did an outpatient cath on this patient today. He wanted to increase her daily potassium by 40 meq. What is ordered and what she is taking is different. Can you please call and verify her potassium dose, then have her add 40 meq to what she is taking and fix on med list? We would like to recheck BMET in 1 week.   Thanks!

## 2018-10-13 NOTE — Telephone Encounter (Signed)
-  note when patient returns call- Please see additional orders per Dr Aundra Dubin

## 2018-10-13 NOTE — Progress Notes (Signed)
Notified from lab of 2.5 potassium.  Jennifer notified.

## 2018-10-13 NOTE — Discharge Instructions (Signed)
Brachial Site Care  This sheet gives you information about how to care for yourself after your procedure. Your health care provider may also give you more specific instructions. If you have problems or questions, contact your health care provider. What can I expect after the procedure? After the procedure, it is common to have:  Bruising and tenderness at the catheter insertion area. Follow these instructions at home: Medicines  Take over-the-counter and prescription medicines only as told by your health care provider. Insertion site care  Follow instructions from your health care provider about how to take care of your insertion site. Make sure you: ? Wash your hands with soap and water before you change your bandage (dressing). If soap and water are not available, use hand sanitizer. ? Change your dressing as told by your health care provider. ? Leave stitches (sutures), skin glue, or adhesive strips in place. These skin closures may need to stay in place for 2 weeks or longer. If adhesive strip edges start to loosen and curl up, you may trim the loose edges. Do not remove adhesive strips completely unless your health care provider tells you to do that.  Check your insertion site every day for signs of infection. Check for: ? Redness, swelling, or pain. ? Fluid or blood. ? Pus or a bad smell. ? Warmth.  Do not take baths, swim, or use a hot tub until your health care provider approves.  You may shower 24-48 hours after the procedure, or as directed by your health care provider. ? Remove the dressing and gently wash the site with plain soap and water. ? Pat the area dry with a clean towel. ? Do not rub the site. That could cause bleeding.  Do not apply powder or lotion to the site. Activity   For 24 hours after the procedure, or as directed by your health care provider: ? Do not flex or bend the affected arm. ? Do not push or pull heavy objects with the affected arm. ? Do not  drive yourself home from the hospital or clinic. You may drive 24 hours after the procedure unless your health care provider tells you not to. ? Do not operate machinery or power tools.  Do not lift anything that is heavier than 10 lb (4.5 kg), or the limit that you are told, until your health care provider says that it is safe.  Ask your health care provider when it is okay to: ? Return to work or school. ? Resume usual physical activities or sports. ? Resume sexual activity. General instructions  If the catheter site starts to bleed, raise your arm and put firm pressure on the site. If the bleeding does not stop, get help right away. This is a medical emergency.  If you went home on the same day as your procedure, a responsible adult should be with you for the first 24 hours after you arrive home.  Keep all follow-up visits as told by your health care provider. This is important. Contact a health care provider if:  You have a fever.  You have redness, swelling, or yellow drainage around your insertion site. Get help right away if:  You have unusual pain at the brachial site.  The catheter insertion area swells very fast.  The insertion area is bleeding, and the bleeding does not stop when you hold steady pressure on the area.  Your arm or hand becomes pale, cool, tingly, or numb. These symptoms may represent a serious problem  that is an emergency. Do not wait to see if the symptoms will go away. Get medical help right away. Call your local emergency services (911 in the [place] Do not drive yourself to the hospital. °Summary °· After the procedure, it is common to have bruising and tenderness at the site. °· Follow instructions from your health care provider about how to take care of your radial site wound. Check the wound every day for signs of infection. °· Do not lift anything that is heavier than 10 lb (4.5 kg), or the limit that you are told, until your health care provider says  that it is safe. °This information is not intended to replace advice given to you by your health care provider. Make sure you discuss any questions you have with your health care provider. °Document Released: 09/01/2010 Document Revised: 09/04/2017 Document Reviewed: 09/04/2017 °Elsevier Interactive Patient Education © 2019 [employer] ° °

## 2018-10-14 ENCOUNTER — Other Ambulatory Visit (HOSPITAL_COMMUNITY): Payer: Self-pay

## 2018-10-14 MED ORDER — POTASSIUM CHLORIDE ER 10 MEQ PO TBCR: EXTENDED_RELEASE_TABLET | ORAL | 1 refills | Status: DC

## 2018-10-15 ENCOUNTER — Other Ambulatory Visit (HOSPITAL_COMMUNITY): Payer: Self-pay | Admitting: Family Medicine

## 2018-10-15 ENCOUNTER — Telehealth (HOSPITAL_COMMUNITY): Payer: Self-pay | Admitting: *Deleted

## 2018-10-15 DIAGNOSIS — Z1231 Encounter for screening mammogram for malignant neoplasm of breast: Secondary | ICD-10-CM

## 2018-10-15 MED ORDER — AMBRISENTAN 10 MG PO TABS: 10.0000 mg | ORAL_TABLET | Freq: Every day | ORAL | 11 refills | Status: DC

## 2018-10-15 NOTE — Telephone Encounter (Signed)
Patient returned call and is aware of all instructions/ medication changes per Dr Aundra Dubin  -will increase ambrisentan to 10 mg daily ([place]) -follow up 3/31 @ 10 (will start uptravi enrollment at follow up) -patient will have labs repeated x 1 week @[facility]  in [place], order faxed to [phone] -potassium addressed by Theo Dills, CMA on 10/14/18, patient was again advised to increase by 40 meq daily  Pt voiced understanding of above

## 2018-10-15 NOTE — Telephone Encounter (Signed)
-----   Message from Larey Dresser, MD sent at 10/13/2018 12:44 PM EST ----- 1. Needs appointment with me soon.  2. Needs to increase ambrisentan to 10 mg daily (takes 5 mg daily).  3. Need to get her set up to start Uptravi.

## 2018-10-15 NOTE — Telephone Encounter (Signed)
Spoke w/pt, she is aware and agreeable, she is sch for f/u on 3/31 and will keep that appt.  She gets her Ambrisentan from [place], new rx sent in for new dose.  Advised her we will fax enrollment for Uptravi to [facility] and they will contact her, she is aware to be on the look out for a call from them.

## 2018-10-19 DIAGNOSIS — J849 Interstitial pulmonary disease, unspecified: Secondary | ICD-10-CM | POA: Diagnosis not present

## 2018-10-19 DIAGNOSIS — I5032 Chronic diastolic (congestive) heart failure: Secondary | ICD-10-CM | POA: Diagnosis not present

## 2018-10-19 DIAGNOSIS — R0902 Hypoxemia: Secondary | ICD-10-CM | POA: Diagnosis not present

## 2018-10-22 ENCOUNTER — Other Ambulatory Visit: Payer: Self-pay | Admitting: "Endocrinology

## 2018-10-22 ENCOUNTER — Other Ambulatory Visit: Payer: Self-pay | Admitting: Family Medicine

## 2018-10-22 DIAGNOSIS — I5032 Chronic diastolic (congestive) heart failure: Secondary | ICD-10-CM | POA: Diagnosis not present

## 2018-10-24 ENCOUNTER — Other Ambulatory Visit (HOSPITAL_COMMUNITY): Payer: Self-pay | Admitting: Cardiology

## 2018-10-30 ENCOUNTER — Ambulatory Visit (HOSPITAL_COMMUNITY)
Admission: RE | Admit: 2018-10-30 | Discharge: 2018-10-30 | Disposition: A | Discharge status: Home / Self Care | Payer: 59 | Source: Ambulatory Visit | Attending: Family Medicine | Admitting: Family Medicine

## 2018-10-30 ENCOUNTER — Other Ambulatory Visit: Payer: Self-pay

## 2018-10-30 DIAGNOSIS — Z1231 Encounter for screening mammogram for malignant neoplasm of breast: Secondary | ICD-10-CM | POA: Diagnosis present

## 2018-10-31 ENCOUNTER — Ambulatory Visit (HOSPITAL_COMMUNITY): Payer: 59

## 2018-10-31 ENCOUNTER — Other Ambulatory Visit: Payer: Self-pay

## 2018-10-31 ENCOUNTER — Ambulatory Visit (HOSPITAL_COMMUNITY)
Admission: RE | Admit: 2018-10-31 | Discharge: 2018-10-31 | Disposition: A | Discharge status: Home / Self Care | Payer: 59 | Source: Ambulatory Visit | Attending: Cardiology | Admitting: Cardiology

## 2018-10-31 DIAGNOSIS — I272 Pulmonary hypertension, unspecified: Secondary | ICD-10-CM

## 2018-10-31 DIAGNOSIS — I5032 Chronic diastolic (congestive) heart failure: Secondary | ICD-10-CM | POA: Diagnosis not present

## 2018-10-31 NOTE — Progress Notes (Signed)
Heart Failure TeleHealth Note   Due to national recommendations of social distancing due to [place] 19, Audio telehealth visit is felt to be most appropriate for this patient at this time.  See MyChart message from today for patient consent regarding telehealth for [facility].   Date:  10/31/2018   ID:  Sabrina, [name] [birth year], MRN [id number]  Location: home  Provider location: [address] [place] Type of Visit: Established patient   PCP:  Fayrene Helper, MD  Cardiologist:  Dr. Radford Pax Primary HF: Dr. Aundra Dubin  Chief Complaint: Shortness of breath   History of Present Illness: Sabrina [name] is a 58 y.o. female who presents via audio/video conferencing for a telehealth visit today.   she denies symptoms of cough, fevers, chills, or new SOB worrisome for COVID 19.   Patient has history of scleroderma with associated mild ILD, Raynauds, and pulmonary hypertension.  She was referred by Dr. Radford Pax and had [place] in 3/20 that showed severe pulmonary arterial hypertension with preserved cardiac output.  She has had the scleroderma diagnosis for > 10 years.  She is on mycophenolate.  Pulmonary hypertension is not a new diagnosis, she has been on ambrisentan.  She is followed by [facility]ology at [facility].    After RHC, I had her increase her ambrisentan to 10 mg daily.  She feels like this has helped her breathing.  She is still short of breath walking around [employer].  No chest pain.  No lightheadedness.  She is not wearing oxygen at home.  She takes her torsemide 20 mg bid regularly and says that her weight at home has been stable.   Labs (3/20): K 3.8, creatinine 1.64  PMH: 1. Pulmonary hypertension: Thought to be related to scleroderma.   - V/Q scan (2017): No evidence for chronic PE.  - Sleep study (2017): Negative - Echo (1/20, [facility]): EF > 55%, moderately dilated RV with moderately decreased systolic function, moderate TR, positive bubble study.  -  RHC (3/20): mean RA 11, PA 128/35 mean 69, mean PCWP 15, CI 2.32, PVR 12.6 WU.  2. Suspect PFO: positive bubble study.  3. HTN 4. Obese 5. Scleroderma: +ANA, +anti-centromere antibody.  Diagnosis x 10 years.  Has associated Raynauds, mild ILD, and severe PAH.  6. Long QT syndrome: Has been on beta blocker.  7. 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  - PFTs (4/19): FVC 63%, FEV1 70%, ratio 109%, TLC 67%, DLCO 60% 8. Hypothyroidism   Past Surgical History:  Procedure Laterality Date   ABDOMINAL HYSTERECTOMY  2004   supracervical hysterectomy   BREAST SURGERY  2005   reduction   BREATH TEK H PYLORI [name] 12/24/2014   Procedure: BREATH TEK H PYLORI;  Surgeon: Greer Pickerel, MD;  Location: [facility];  Service: General;  Laterality: [name];   CARDIAC CATHETERIZATION [name] 03/22/2016   Procedure: Right/Left Heart Cath and Coronary Angiography;  Surgeon: Troy Sine, MD;  Location: [facility];  Service: Cardiovascular;  Laterality: [name];   DILATION AND CURETTAGE OF UTERUS     age 15   ESOPHAGOGASTRODUODENOSCOPY (EGD) WITH PROPOFOL [name] 10/14/2015   Procedure: ESOPHAGOGASTRODUODENOSCOPY (EGD) WITH PROPOFOL;  Surgeon: Clarene Essex, MD;  Location: [facility];  Service: Endoscopy;  Laterality: [name];   LAPAROSCOPIC GASTRIC SLEEVE RESECTION WITH HIATAL HERNIA REPAIR [name] 07/05/2015   Procedure: LAPAROSCOPIC GASTRIC SLEEVE RESECTION WITH HIATAL HERNIA REPAIR, upper endoscopy;  Surgeon: Greer Pickerel, MD;  Location: [facility];  Service: General;  Laterality: [name];   REDUCTION MAMMAPLASTY Bilateral    RIGHT HEART CATH [name] 10/13/2018   Procedure: RIGHT HEART CATH;  Surgeon: Larey Dresser, MD;  Location: [facility] [facility];  Service: Cardiovascular;  Laterality: [name];     Current Outpatient Medications  Medication Sig Dispense Refill   acetaminophen (TYLENOL) 500 MG tablet Take 500-1,000 mg by mouth every 6 (six) hours as needed for headache.      ambrisentan (LETAIRIS) 10 MG  tablet Take 1 tablet (10 mg total) by mouth daily. 30 tablet 11   aspirin EC 81 MG tablet Take 1 tablet (81 mg total) by mouth daily.     benzonatate (TESSALON) 100 MG capsule Take 1 capsule (100 mg total) by mouth 2 (two) times daily as needed for cough. 20 capsule 0   CALCIUM-MAGNESIUM-VITAMIN D PO Take 1 tablet by mouth 2 (two) times daily.     ergocalciferol (VITAMIN D2) 50000 units capsule Take 1 capsule (50,000 Units total) by mouth once a week. One capsule once weekly (Patient not taking: Reported on 10/09/2018) 12 capsule 1   levothyroxine (SYNTHROID, LEVOTHROID) 75 MCG tablet TAKE 1 TABLET (75 MCG TOTAL) BY MOUTH DAILY BEFORE BREAKFAST. 30 tablet 6   Multiple Vitamin (MULTIVITAMIN WITH MINERALS) TABS tablet Take 1 tablet by mouth daily.     mycophenolate (MYFORTIC) 360 MG TBEC EC tablet Take 720 mg by mouth 2 (two) times daily.      nitroGLYCERIN (NITROSTAT) 0.4 MG SL tablet Place 0.4 mg under the tongue every 5 (five) minutes x 3 doses as needed for chest pain.      pantoprazole (PROTONIX) 20 MG tablet Take 20 mg by mouth See admin instructions. Take 1 tablet (20 mg) by mouth scheduled each morning, may take an additional dose in the evening if needed for heartburn/indigestion.     pantoprazole (PROTONIX) 40 MG tablet Take 30- 60 min before your first and last meals of the day (Patient not taking: Reported on 10/09/2018) 60 tablet 2   potassium chloride (K-DUR) 10 MEQ tablet TAKE TWO TABLETS 3 TIMES DAILY FOR 2 DAYS , THEN TWO TABLETS TWICE DAILY 130 tablet 1   predniSONE (DELTASONE) 10 MG tablet Take one tablet two times daily for 5 days 10 tablet 0   temazepam (RESTORIL) 15 MG capsule Take 1 capsule (15 mg total) by mouth at bedtime as needed for sleep. 30 capsule 4   torsemide (DEMADEX) 20 MG tablet Take 1 tablet (20 mg total) by mouth 2 (two) times daily. 60 tablet 11   triamcinolone ointment (KENALOG) 0.1 % Apply 1 application topically 2 (two) times daily as needed  (dry/irritated skin.). APPLY AS DIRECTED TO ITCHING SPOTS ON THE BODY DAILY TO TWICE DAILY AS NEEDED. NOT TO FACE.      UNABLE TO FIND Overnight Pulse Oximetry (to evaluate the need for continued nocturnal oxygen)  DX I27.20 1 each 0   No current facility-administered medications for this encounter.     Allergies:   Peanut oil; Potassium iodide; Ace inhibitors; Doxycycline; Peanut-containing drug products; Amlodipine; Codeine; Latex; and Penicillins   Social History:  The patient  reports that she has never smoked. She has never used smokeless tobacco. She reports that she does not drink alcohol or use drugs.   Family History:  The patient's  family history includes Arthritis in her father, mother, and sister; Bleeding Disorder in her sister; Breast cancer in her sister; CAD in her mother; Cancer in her father; Colon cancer in her maternal  grandfather; Depression in her father, mother, and sister; Diabetes in her father and mother; Hypertension in her father, mother, and sister; Stroke in her father and maternal aunt; Thyroid disease in her sister.    ROS:  Please see the history of present illness.   All other systems are personally reviewed and negative.   Exam:  Only able to use audio, patient unable to get Webex working.  General:  No resp difficulty. Lungs: Normal respiratory effort with conversation.  Extremities: Pt denies edema.  Neuro: Alert & oriented x 3.   Wt Readings from Last 3 Encounters:  10/13/18 87.5 kg (193 lb)  09/30/18 90.7 kg (200 lb)  08/19/18 87.1 kg (192 lb)     ASSESSMENT AND PLAN:  1.  Pulmonary hypertension: Severe PAH with preserved cardiac output by cath in 3/20 (systemic pressure).  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  Has been known for several years.  In 2017, V/Q study was negative for chronic PE and sleep study was negative.  PFTs suggestive of restriction, has mild ILD on CT. Echo in 1/20 showed moderately dilated RV with  moderately decreased systolic function.  NYHA class III symptoms, she feels better with increase in ambrisentan to 10 mg daily.  - Continue ambrisentan 10 mg daily.  - Will add prostanoid next, start selexipag 200 mcg bid.   - Will need 6 minute walk at next in-office visit.   - Will see how she does with aggressive treatment, may end up needing parenteral therapy.  2. Scleroderma: Followed by rheumatology at [facility].  She is on Mycophenolate.  She has mild interstitial lung disease, pulmonary hypertension, and Raynauds phenomenon.  3. Interstitial lung disease: Mild by imaging, PFTs suggest restriction.  Likely related to scleroderma.  4. PFO: Noted by bubble study in past.  5. Long QT syndrome: Has been followed by EP in the past, not currently on a beta blocker.  - Will get ECG at next in-office visit.   COVID screen The patient does not have any symptoms that suggest any further testing/ screening at this time.  Social distancing reinforced today.    Follow-up:  6 wks in office  Current medicines are reviewed at length with the patient today.   The patient does not have concerns regarding her medicines.    The following changes were made today:  Start selexipag.    Labs/ tests ordered today include:  No orders of the defined types were placed in this encounter.   Patient Risk:  after full review of this patients clinical status, I feel that they are at high risk at this time.  Today, I have spent 15 minutes with the patient with telehealth technology discussing pulmonary hypertension.    Signed, Loralie Champagne, MD  10/31/2018 4:25 PM  [facility] [address] [place] and [place] [address] [phone] (office) [phone] (fax)

## 2018-11-05 ENCOUNTER — Telehealth (HOSPITAL_COMMUNITY): Payer: Self-pay

## 2018-11-05 NOTE — Telephone Encounter (Signed)
Prior authorization through [employer] company was initiated for selexipag medication and sent via cmm on 11/05/2018.

## 2018-11-06 NOTE — Telephone Encounter (Signed)
Prior authorization through [employer] company was APPROVED for Sabrina [name] 200/800 and will expire on 11/05/2019.

## 2018-11-10 ENCOUNTER — Ambulatory Visit (HOSPITAL_COMMUNITY): Admission: RE | Admit: 2018-11-10 | Payer: 59 | Source: Ambulatory Visit

## 2018-11-11 ENCOUNTER — Encounter (HOSPITAL_COMMUNITY): Payer: 59 | Admitting: Cardiology

## 2018-11-12 ENCOUNTER — Other Ambulatory Visit: Payer: Self-pay | Admitting: "Endocrinology

## 2018-11-12 DIAGNOSIS — E119 Type 2 diabetes mellitus without complications: Secondary | ICD-10-CM

## 2018-11-12 DIAGNOSIS — E042 Nontoxic multinodular goiter: Secondary | ICD-10-CM

## 2018-11-13 ENCOUNTER — Ambulatory Visit: Payer: 59 | Admitting: "Endocrinology

## 2018-11-15 ENCOUNTER — Other Ambulatory Visit: Payer: Self-pay | Admitting: Family Medicine

## 2018-11-19 ENCOUNTER — Telehealth (HOSPITAL_COMMUNITY): Payer: Self-pay

## 2018-11-19 DIAGNOSIS — R0902 Hypoxemia: Secondary | ICD-10-CM | POA: Diagnosis not present

## 2018-11-19 DIAGNOSIS — I5032 Chronic diastolic (congestive) heart failure: Secondary | ICD-10-CM | POA: Diagnosis not present

## 2018-11-19 DIAGNOSIS — J849 Interstitial pulmonary disease, unspecified: Secondary | ICD-10-CM | POA: Diagnosis not present

## 2018-11-19 NOTE — Telephone Encounter (Signed)
Incoming fax from [employer] Visit on 11/14/18. Discussed dosing and disease process of PAH. Pt agreed to start uptravi 200mg  bid starting 11/15/18 with the morning dose. Pt verbalized understanding of teaching.   BP 100/61; HR 75; Weight: 193

## 2018-11-25 ENCOUNTER — Telehealth (HOSPITAL_COMMUNITY): Payer: Self-pay

## 2018-11-25 NOTE — Telephone Encounter (Signed)
Received fax from [facility] [place], they have been unsuccessful reaching patient to mail medication Ambrisentan.  Pt reports she spoke with them this morning and medication will arrive tomorrow. Pt appreciative of call

## 2018-11-30 ENCOUNTER — Other Ambulatory Visit: Payer: Self-pay | Admitting: Family Medicine

## 2018-12-01 ENCOUNTER — Other Ambulatory Visit: Payer: Self-pay

## 2018-12-01 MED ORDER — POTASSIUM CHLORIDE ER 10 MEQ PO TBCR: EXTENDED_RELEASE_TABLET | ORAL | 1 refills | Status: DC

## 2018-12-03 DIAGNOSIS — I2721 Secondary pulmonary arterial hypertension: Secondary | ICD-10-CM | POA: Diagnosis not present

## 2018-12-03 DIAGNOSIS — M359 Systemic involvement of connective tissue, unspecified: Secondary | ICD-10-CM | POA: Diagnosis not present

## 2018-12-03 DIAGNOSIS — J849 Interstitial pulmonary disease, unspecified: Secondary | ICD-10-CM | POA: Diagnosis not present

## 2018-12-05 ENCOUNTER — Encounter: Payer: Self-pay | Admitting: Family Medicine

## 2018-12-05 ENCOUNTER — Other Ambulatory Visit: Payer: Self-pay

## 2018-12-05 ENCOUNTER — Ambulatory Visit (INDEPENDENT_AMBULATORY_CARE_PROVIDER_SITE_OTHER): Payer: 59 | Admitting: Family Medicine

## 2018-12-05 DIAGNOSIS — I10 Essential (primary) hypertension: Secondary | ICD-10-CM | POA: Diagnosis not present

## 2018-12-05 DIAGNOSIS — E119 Type 2 diabetes mellitus without complications: Secondary | ICD-10-CM

## 2018-12-05 DIAGNOSIS — G479 Sleep disorder, unspecified: Secondary | ICD-10-CM | POA: Diagnosis not present

## 2018-12-05 NOTE — Progress Notes (Signed)
Virtual Visit via Telephone Note  I connected with Sabrina [name] on 12/05/18 at 10:00 AM EDT by telephone and verified that I am speaking with the correct person using two identifiers.   I discussed the limitations, risks, security and privacy concerns of performing an evaluation and management service by telephone and the availability of in person appointments. I also discussed with the patient that there may be a patient responsible charge related to this service. The patient expressed understanding and agreed to proceed.  Location of Patient: Home Location of Provider: Telehealth Consent was obtain for visit to be over via telehealth.  History of Present Illness: Sabrina [name] is a 58 year old female patient of Dr. Griffin Dakin.  Who presents today for follow-up.   Pulmonary hypertension: Was seen by Dr. Aundra Dubin earlier this year had a cath to perform pressure assessment.  Patient has scleroderma and this is placed her in a suspected Group 1 pulmonary hypertension.  Echo 1-20 showed moderately dilated RV with moderately decreased systolic function.  NYHA class III symptoms, feels better since he had increased her ambrisentan.  Per last note with Aundra Dubin will continue with the ambrisentan, added selexipag.   Sleep trouble: She reports that she is not taking the Restoril as directed.  Does not want it to be habit-forming or have to take it every day.  But she reports she is only getting 4 hours of sleep at a time.  She reports when she does take the Restoril she might get 5 hours asleep at a time.    Overall today on the telephone visit she is doing well and is without complaints.  She would just like to be able to sleep a little bit better.  She is feeling much better since Dr. Aundra Dubin had adjusted some of her medications.  Review of Systems  Constitutional: Negative.   HENT: Negative.   Eyes: Negative.   Respiratory: Negative.   Cardiovascular: Negative.   Gastrointestinal: Negative.    Genitourinary: Negative.   Musculoskeletal: Negative.   Skin: Negative.   Neurological: Negative.   Endo/Heme/Allergies: Negative.   Psychiatric/Behavioral:       Sleep trouble    Observations/Objective: Good communication via telephone. Alert and Oriented Memory intact No signs or symptoms of respiratory distress noted during conversation today, no shortness of breath.  Assessment and Plan: 1. Diabetes mellitus without complication ([place]) She would like to have diabetes diagnosis removed from her history or marked as a history of.  As she has not been treated for diabetes in several years.  She reports that she is doing very well with diet control.  I asked her to talk to Dr. Moshe Cipro about doing 1 more check before this is moved to has a history of.  2. Essential hypertension Controlled is currently followed by cardiology.  Blood pressure is controlled.  As per her report.  And previous office notes.  Had recent heart cath to assess pulmonary hypertension.  Medication was increased for this.  We will continue to have cardiology manage her and if they need [place] we will be glad to help with collaboration in her care.  Continue current medication regime.  3. Sleep trouble Reports she is having some sleep trouble, she has not been taking the Restoril as directed regularly.  Orts that she did not want to take it every day.  Does not want to be habit-forming.  Advised that if she is taking it and it is helping her to sleep.  That she should take it  as directed.  As we would know if it is helping her or not without her taking it as directed on a regular basis.  Provided her with sleep hygiene tips in her AVS.   Follow Up Instructions: AVS printed and mailed    I discussed the assessment and treatment plan with the patient. The patient was provided an opportunity to ask questions and all were answered. The patient agreed with the plan and demonstrated an understanding of the instructions.    The patient was advised to call back or seek an in-person evaluation if the symptoms worsen or if the condition fails to improve as anticipated.  I provided 10 minutes of non-face-to-face time during this encounter.   Perlie Mayo, NP

## 2018-12-05 NOTE — Patient Instructions (Addendum)
Thank you for leading your visit today via telephone. I appreciate the opportunity to provide you with the care for your health and wellness. Today we discussed: Overall health and wellness including sleep hygiene  I have attached some sleep hygiene information for you to review.  Hopefully some of these will help.  Please take your medication regularly so that we can decide whether or not it is benefiting you for the number of hours that you can sleep on a regular basis.  If not we will have a better idea of what to possibly change you to.  Additionally glad that your blood pressure is doing well please continue your medications as directed.  If you have any trouble or start experiencing any vision changes, headaches, chest pain or chest discomfort please do not hesitate to reach out to [place] or your cardiologist.  Dr. Moshe Cipro will look to check your labs at your next appointment.  If you need [place] between now and then please do not hesitate to reach out for anything.  [place] YOUR HANDS WELL AND FREQUENTLY. AVOID TOUCHING YOUR FACE, UNLESS YOUR HANDS ARE FRESHLY WASHED.  GET FRESH AIR DAILY. STAY HYDRATED WITH WATER.   It was a pleasure to see you and I look forward to continuing to work together on your health and well-being. Please do not hesitate to call the office if you need care or have questions about your care.  Have a wonderful day and week. With Gratitude, Cherly Beach, DNP, AGNP-BC  SLEEP HYGIENE   Teens need about 9 hours of sleep a night. Younger children need more sleep (10-11 hours a night) and adults need slightly less (7-9 hours each night). 11 Tips to Follow: 1. No caffeine after 3pm: Avoid beverages with caffeine (soda, tea, energy drinks, etc.) especially after 3pm.  2. Don't go to bed hungry: Have your evening meal at least 3 hrs. before going to sleep. It's fine to have a small bedtime snack such as a glass of milk and a few crackers but don't have a big meal.  3.  Have a nightly routine before bed: Plan on "winding down" before you go to sleep. Begin relaxing about 1 hour before you go to bed. Try doing a quiet activity such as listening to calming music, reading a book or meditating.  4. Turn off the TV and ALL electronics including video games, tablets, laptops, etc. 1 hour before sleep, and keep them out of the bedroom.  5. Turn off your cell phone and all notifications (new email and text alerts) or even better, leave your phone outside your room while you sleep. Studies have shown that a part of your brain continues to respond to certain lights and sounds even while you're still asleep.  6. Make your bedroom quiet, dark and cool. If you can't control the noise, try wearing earplugs or using a fan to block out other sounds.  7. Practice relaxation techniques. Try reading a book or meditating or drain your brain by writing a list of what you need to do the next day.  8. Don't nap unless you feel sick: you'll have a better night's sleep.  9. Don't smoke, or quit if you do. Nicotine, alcohol, and marijuana can all keep you awake. Talk to your health care provider if you need help with substance use.  10. Most importantly, wake up at the same time every day (or within 1 hour of your usual wake up time) EVEN on the weekends.  A regular wake up time promotes sleep hygiene and prevents sleep problems.  11. Reduce exposure to bright light in the last three hours of the day before going to sleep.  Maintaining good sleep hygiene and having good sleep habits lower your risk of developing sleep problems. Getting better sleep can also improve your concentration and alertness. Try the simple steps in this guide. If you still have trouble getting enough rest, make an appointment with your health care provider.

## 2018-12-08 ENCOUNTER — Telehealth (HOSPITAL_COMMUNITY): Payer: Self-pay | Admitting: Cardiology

## 2018-12-08 MED ORDER — AMBRISENTAN 5 MG PO TABS: 5.0000 mg | ORAL_TABLET | Freq: Every day | ORAL | 3 refills | Status: DC

## 2018-12-08 NOTE — Telephone Encounter (Signed)
Patient reports since increasing dose of Letaris to 10 mg she has HA's daily and severe diarrhea. Reports she has been on this new dose x 1 month and this never happened while taking 5 mg daily.  She does not have a speciality nurse that follows her, would you like to decrease back to 5 mg daily

## 2018-12-08 NOTE — Telephone Encounter (Signed)
Pt aware and voiced understanding.  New rx sent to pharmacy

## 2018-12-08 NOTE — Telephone Encounter (Signed)
Okay. She can try cutting back down to 5 mg daily and see if that improves HA and diarrhea. On UpToDate, HA is a common side effect, but diarrhea is not listed. She can discuss further at f/u appointment with Dr Aundra Dubin next week. Thanks

## 2018-12-09 DIAGNOSIS — E042 Nontoxic multinodular goiter: Secondary | ICD-10-CM | POA: Diagnosis not present

## 2018-12-10 ENCOUNTER — Other Ambulatory Visit: Payer: Self-pay | Admitting: Family Medicine

## 2018-12-10 LAB — COMPREHENSIVE METABOLIC PANEL
AG Ratio: 1.3 (calc) (ref 1.0–2.5)
ALT: 9 U/L (ref 6–29)
AST: 15 U/L (ref 10–35)
Albumin: 4.1 g/dL (ref 3.6–5.1)
Alkaline phosphatase (APISO): 53 U/L (ref 37–153)
BUN/Creatinine Ratio: 19 (calc) (ref 6–22)
BUN: 22 mg/dL (ref 7–25)
CO2: 26 mmol/L (ref 20–32)
Calcium: 7.9 mg/dL — ABNORMAL LOW (ref 8.6–10.4)
Chloride: 103 mmol/L (ref 98–110)
Creat: 1.14 mg/dL — ABNORMAL HIGH (ref 0.50–1.05)
Globulin: 3.1 g/dL (calc) (ref 1.9–3.7)
Glucose, Bld: 79 mg/dL (ref 65–99)
Potassium: 3.8 mmol/L (ref 3.5–5.3)
Sodium: 142 mmol/L (ref 135–146)
Total Bilirubin: 0.4 mg/dL (ref 0.2–1.2)
Total Protein: 7.2 g/dL (ref 6.1–8.1)

## 2018-12-10 LAB — T4, FREE: Free T4: 1.2 ng/dL (ref 0.8–1.8)

## 2018-12-10 LAB — TSH: TSH: 1.59 mIU/L

## 2018-12-15 ENCOUNTER — Encounter (HOSPITAL_COMMUNITY): Payer: 59 | Admitting: Cardiology

## 2018-12-15 ENCOUNTER — Other Ambulatory Visit: Payer: Self-pay

## 2018-12-15 ENCOUNTER — Telehealth (HOSPITAL_COMMUNITY): Payer: Self-pay

## 2018-12-15 ENCOUNTER — Ambulatory Visit (HOSPITAL_COMMUNITY)
Admission: RE | Admit: 2018-12-15 | Discharge: 2018-12-15 | Disposition: A | Discharge status: Home / Self Care | Payer: 59 | Source: Ambulatory Visit | Attending: Cardiology | Admitting: Cardiology

## 2018-12-15 DIAGNOSIS — I5032 Chronic diastolic (congestive) heart failure: Secondary | ICD-10-CM

## 2018-12-15 DIAGNOSIS — I272 Pulmonary hypertension, unspecified: Secondary | ICD-10-CM

## 2018-12-15 NOTE — Telephone Encounter (Signed)
Attempted to call pt to review AVS, no VM option, follow up scheduled 7 July @10 :20, will mail AVS

## 2018-12-15 NOTE — Progress Notes (Signed)
Heart Failure TeleHealth Note  Due to national recommendations of social distancing due to [place] 19, Audio/video telehealth visit is felt to be most appropriate for this patient at this time.  See MyChart message from today for patient consent regarding telehealth for [facility].  Date:  12/15/2018   ID:  Sabrina [name] August 13, 1961, MRN [id number]  Location: Home  Provider location: [place] [facility] Type of Visit: Established patient  PCP:  Sabrina Helper, MD  Cardiologist:  Dr. Aundra Dubin  Chief Complaint: Exertional dyspnea   History of Present Illness: Sabrina [name] is a 58 y.o. female who presents via audio/video conferencing for a telehealth visit today.     she denies symptoms worrisome for COVID [place] is a 58 y.o. female who presents via audio/video conferencing for a telehealth visit today.   she denies symptoms of cough, fevers, chills, or new SOB worrisome for COVID 19.   Patient has history of scleroderma with associated mild ILD, Raynauds, and pulmonary hypertension.  She was referred by Dr. Radford Pax and had [facility] in 3/20 that showed severe pulmonary arterial hypertension with preserved cardiac output.  She has had the scleroderma diagnosis for > 10 years.  She is on mycophenolate.  Pulmonary hypertension is not a new diagnosis, she has been on ambrisentan.  She is followed by rheumatology at [facility].    After [facility], I had her increase her ambrisentan to 10 mg daily.    She did not tolerate increase in ambrisentan well, developed peripheral edema and weight went up.  I had her decrease the ambrisentan back to 5 mg daily and edema has resolved.  Weight now down to baseline.  She feels like she is breathing much better since starting on Uptravi.  No dyspnea walking on flat ground, mild dyspnea walking up a flight of stairs.  No chest pain.  No lightheadedness/syncope.    Labs (3/20): K 3.8, creatinine 1.64 Labs  (4/20): TSH and free T4 normal, K 3.8, creatinine 1.14  PMH: 1. Pulmonary hypertension: Thought to be related to scleroderma.   - V/Q scan (2017): No evidence for chronic PE.  - Sleep study (2017): Negative - Echo (1/20, [facility]): EF > 55%, moderately dilated RV with moderately decreased systolic function, moderate TR, positive bubble study.  - RHC (3/20): mean RA 11, PA 128/35 mean 69, mean PCWP 15, CI 2.32, PVR 12.6 WU.  - Cannot tolerate 10 mg ambrisentan due to edema.  2. Suspect PFO: positive bubble study.  3. HTN 4. Obese 5. Scleroderma: +ANA, +anti-centromere antibody.  Diagnosis x 10 years.  Has associated Raynauds, mild ILD, and severe PAH.  6. Long QT syndrome: Has been on beta blocker.  7. 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  - PFTs (4/19): FVC 63%, FEV1 70%, ratio 109%, TLC 67%, DLCO 60% 8. Hypothyroidism    Current Outpatient Medications  Medication Sig Dispense Refill  . acetaminophen (TYLENOL) 500 MG tablet Take 500-1,000 mg by mouth every 6 (six) hours as needed for headache.     [name] ambrisentan (LETAIRIS) 5 MG tablet Take 1 tablet (5 mg total) by mouth daily. 90 tablet 3  . aspirin EC 81 MG tablet Take 1 tablet (81 mg total) by mouth daily.    . benzonatate (TESSALON) 100 MG capsule Take 1 capsule (100 mg total) by mouth 2 (two) times daily as needed for cough. 20 capsule 0  . CALCIUM-MAGNESIUM-VITAMIN D PO Take 1 tablet  by mouth 2 (two) times daily.    . ergocalciferol (VITAMIN D2) 50000 units capsule Take 1 capsule (50,000 Units total) by mouth once a week. One capsule once weekly 12 capsule 1  . levothyroxine (SYNTHROID, LEVOTHROID) 75 MCG tablet TAKE 1 TABLET (75 MCG TOTAL) BY MOUTH DAILY BEFORE BREAKFAST. 30 tablet 6  . Multiple Vitamin (MULTIVITAMIN WITH MINERALS) TABS tablet Take 1 tablet by mouth daily.    . mycophenolate (MYFORTIC) 360 MG TBEC EC tablet Take 720 mg by mouth 2 (two) times daily.     . nitroGLYCERIN (NITROSTAT) 0.4 MG SL tablet  Place 0.4 mg under the tongue every 5 (five) minutes x 3 doses as needed for chest pain.     . pantoprazole (PROTONIX) 20 MG tablet Take 20 mg by mouth See admin instructions. Take 1 tablet (20 mg) by mouth scheduled each morning, may take an additional dose in the evening if needed for heartburn/indigestion.    . potassium chloride (K-DUR) 10 MEQ tablet Take 2 tablets by mouth twice daily 390 tablet 1  . temazepam (RESTORIL) 15 MG capsule TAKE 1 CAPSULE (15 MG TOTAL) BY MOUTH AT BEDTIME AS NEEDED FOR SLEEP. 30 capsule 0  . torsemide (DEMADEX) 20 MG tablet Take 1 tablet (20 mg total) by mouth 2 (two) times daily. 60 tablet 11  . triamcinolone ointment (KENALOG) 0.1 % Apply 1 application topically 2 (two) times daily as needed (dry/irritated skin.). APPLY AS DIRECTED TO ITCHING SPOTS ON THE BODY DAILY TO TWICE DAILY AS NEEDED. NOT TO FACE.     [name] UNABLE TO FIND Overnight Pulse Oximetry (to evaluate the need for continued nocturnal oxygen)  DX I27.20 1 each 0   No current facility-administered medications for this encounter.     Allergies:   Peanut oil; Potassium iodide; Ace inhibitors; Doxycycline; Peanut-containing drug products; Amlodipine; Codeine; Latex; and Penicillins   Social History:  The patient  reports that she has never smoked. She has never used smokeless tobacco. She reports that she does not drink alcohol or use drugs.   Family History:  The patient's family history includes Arthritis in her father, mother, and sister; Bleeding Disorder in her sister; Breast cancer in her sister; CAD in her mother; Cancer in her father; Colon cancer in her maternal grandfather; Depression in her father, mother, and sister; Diabetes in her father and mother; Hypertension in her father, mother, and sister; Stroke in her father and maternal aunt; Thyroid disease in her sister.   ROS:  Please see the history of present illness.   All other systems are personally reviewed and negative.   Exam:   (Video/Tele Health Call; Exam is subjective and or/visual.) General:  Speaks in full sentences. No resp difficulty. Neck: No JVD.  Lungs: Normal respiratory effort with conversation.  Abdomen: Non-distended per patient report Extremities: Pt denies edema. Neuro: Alert & oriented x 3.   Recent Labs: 08/19/2018: Magnesium 2.1 10/07/2018: Platelets 344 10/13/2018: Hemoglobin 10.2 12/09/2018: ALT 9; BUN 22; Creat 1.14; Potassium 3.8; Sodium 142; TSH 1.59  Personally reviewed   Wt Readings from Last 3 Encounters:  10/13/18 87.5 kg (193 lb)  09/30/18 90.7 kg (200 lb)  08/19/18 87.1 kg (192 lb)      ASSESSMENT AND PLAN:  1.  Pulmonary hypertension: Severe PAH with preserved cardiac output by cath in 3/20 (systemic pressure).  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  Has been known for several years.  In 2017, V/Q study was negative for chronic PE and  sleep study was negative.  PFTs suggestive of restriction, has mild ILD on CT. Echo in 1/20 showed moderately dilated RV with moderately decreased systolic function.  She is on ambrisentan 5 mg daily, developed significant edema at 10 mg daily.  Currently NYHA class II, feels better since starting Uptravi.   - Continue ambrisentan 5 mg daily, will not increase.  - Continue to titrate up selexipag as tolerated, currently at 1000 mcg bid.  - Adcirca or riociguat will be next step.    - Will need 6 minute walk at next in-office visit.   - Will see how she does with aggressive treatment, may end up needing parenteral therapy.  2. Scleroderma: Followed by rheumatology at [facility].  She is on Mycophenolate.  She has mild interstitial lung disease, pulmonary hypertension, and Raynauds phenomenon.  3. Interstitial lung disease: Mild by imaging, PFTs suggest restriction.  Likely related to scleroderma.  4. PFO: Noted by bubble study in past.  5. Long QT syndrome: Has been followed by EP in the past, not currently on a beta blocker.  - Will  get ECG at next in-office visit.   COVID screen The patient does not have any symptoms that suggest any further testing/ screening at this time.  Social distancing reinforced today.  Patient Risk: After full review of this patients clinical status, I feel that they are at moderate risk for cardiac decompensation at this time.  Relevant cardiac medications were reviewed at length with the patient today. The patient does not have concerns regarding their medications at this time.   Recommended follow-up:  2 months.   Today, I have spent 21 minutes with the patient with telehealth technology discussing the above issues .    Signed, Loralie Champagne, MD  12/15/2018  [facility] [address] [facility] [place] [address] [phone] (office) [phone] (fax)

## 2018-12-15 NOTE — Patient Instructions (Signed)
Please follow up with an office visit to see Dr Aundra Dubin on 7 July @ 10:20 am, the garage code will be [id number].  Call the office at [phone], option 2 for the nurses, if you have any questions or concerns.

## 2018-12-15 NOTE — Progress Notes (Signed)
Received fax form [facility]. uptravi 1066mcg bid started 12/13/18. Pt denies any side effects. Pt verbalized understanding of new titration.

## 2018-12-30 ENCOUNTER — Telehealth: Payer: Self-pay | Admitting: Cardiology

## 2018-12-30 NOTE — Telephone Encounter (Signed)
Ambrisentan was decreased from 10 mg to 5 mg due to swelling, pt is also on Uptravi, will send to Dr Aundra Dubin for further review/recommendations.

## 2018-12-30 NOTE — Telephone Encounter (Signed)
Increase torsemide to 40 qam/20 qpm and get BMET in 10 days.

## 2018-12-30 NOTE — Telephone Encounter (Signed)
Per Ivin Booty, patient says she is retaining fluid and was started on medicine Uptravi on 4/1 and Amberstan per Dr. Aundra Dubin. She called CHF clinic on yesterday one has returned her call.

## 2018-12-30 NOTE — Telephone Encounter (Signed)
Call patient to confirm appointment with Sabrina [name] on Friday.  Patient states she is not doing good.  Have problems with SOB new med is not working.   Having h/a and feeling she is reating flood.    New med Amberstan 5 mg that was giving at with Dr. Aundra Dubin

## 2018-12-30 NOTE — Telephone Encounter (Signed)
Spoke with the patient, the Ambrisentan has been causing the patient to have head aches and she just has not felt well. She also stated that her swelling has not improved.

## 2018-12-31 ENCOUNTER — Telehealth: Payer: Self-pay | Admitting: Cardiology

## 2018-12-31 ENCOUNTER — Telehealth (HOSPITAL_COMMUNITY): Payer: Self-pay

## 2018-12-31 NOTE — Telephone Encounter (Signed)
Virtual Visit Pre-Appointment Phone Call  "(Name), I am calling you today to discuss your upcoming appointment. We are currently trying to limit exposure to the virus that causes COVID-19 by seeing patients at home rather than in the office."  1. "What is the BEST phone number to call the day of the visit?" - include this in appointment notes  2. Do you have or have access to (through a family member/friend) a smartphone with video capability that we can use for your visit?" a. If yes - list this number in appt notes as cell (if different from BEST phone #) and list the appointment type as a VIDEO visit in appointment notes b. If no - list the appointment type as a PHONE visit in appointment notes  3. Confirm consent - "In the setting of the current Covid19 crisis, you are scheduled for a (phone or video) visit with your provider on (date) at (time).  Just as we do with many in-office visits, in order for you to participate in this visit, we must obtain consent.  If you'd like, I can send this to your mychart (if signed up) or email for you to review.  Otherwise, I can obtain your verbal consent now.  All virtual visits are billed to your insurance company just like a normal visit would be.  By agreeing to a virtual visit, we'd like you to understand that the technology does not allow for your provider to perform an examination, and thus may limit your provider's ability to fully assess your condition. If your provider identifies any concerns that need to be evaluated in person, we will make arrangements to do so.  Finally, though the technology is pretty good, we cannot assure that it will always work on either your or our end, and in the setting of a video visit, we may have to convert it to a phone-only visit.  In either situation, we cannot ensure that we have a secure connection.  Are you willing to proceed?" STAFF: Did the patient verbally acknowledge consent to telehealth visit? Document  YES/NO here: yes/Video/doxy.me/smartphone Sabrina [name] consent 12/30/18/vitals  4. Advise patient to be prepared - "Two hours prior to your appointment, go ahead and check your blood pressure, pulse, oxygen saturation, and your weight (if you have the equipment to check those) and write them all down. When your visit starts, your provider will ask you for this information. If you have an Apple Watch or Kardia device, please plan to have heart rate information ready on the day of your appointment. Please have a pen and paper handy nearby the day of the visit as well."  5. Give patient instructions for MyChart download to smartphone OR Doximity/Doxy.me as below if video visit (depending on what platform provider is using)  6. Inform patient they will receive a phone call 15 minutes prior to their appointment time (may be from unknown caller ID) so they should be prepared to answer    Sabrina [name] has been deemed a candidate for a follow-up tele-health visit to limit community exposure during the Covid-19 pandemic. I spoke with the patient via phone to ensure availability of phone/video source, confirm preferred email & phone number, and discuss instructions and expectations.  I reminded Sabrina [name] to be prepared with any vital sign and/or heart rhythm information that could potentially be obtained via home monitoring, at the time of her visit. I reminded Sabrina [name] to expect a phone  call prior to her visit.  Armando Gang 12/31/2018 9:19 AM   INSTRUCTIONS FOR DOWNLOADING THE MYCHART APP TO SMARTPHONE  - The patient must first make sure to have activated MyChart and know their login information - If Apple, go to [employer] and type in MyChart in the search bar and download the app. If Android, ask patient to go to [employer] and type in [place] in the search bar and download the app. The app is free but as with any other app downloads, their  phone may require them to verify saved payment information or Apple/Android password.  - The patient will need to then log into the app with their MyChart username and password, and select [facility] as their healthcare provider to link the account. When it is time for your visit, go to the MyChart app, find appointments, and click Begin Video Visit. Be sure to Select Allow for your device to access the Microphone and Camera for your visit. You will then be connected, and your provider will be with you shortly.  **If they have any issues connecting, or need assistance please contact MyChart service desk [phone])**  **If using a computer, in order to ensure the best quality for their visit they will need to use either of the following Internet Browsers: [employer], or Google Chrome**  IF USING DOXIMITY or DOXY.ME - The patient will receive a link just prior to their visit by text.     FULL LENGTH CONSENT FOR TELE-HEALTH VISIT   I hereby voluntarily request, consent and authorize [facility] and its employed or contracted physicians, physician assistants, nurse practitioners or other licensed health care professionals (the Practitioner), to provide me with telemedicine health care services (the Services") as deemed necessary by the treating Practitioner. I acknowledge and consent to receive the Services by the Practitioner via telemedicine. I understand that the telemedicine visit will involve communicating with the Practitioner through live audiovisual communication technology and the disclosure of certain medical information by electronic transmission. I acknowledge that I have been given the opportunity to request an in-person assessment or other available alternative prior to the telemedicine visit and am voluntarily participating in the telemedicine visit.  I understand that I have the right to withhold or withdraw my consent to the use of telemedicine in the course of  my care at any time, without affecting my right to future care or treatment, and that the Practitioner or I may terminate the telemedicine visit at any time. I understand that I have the right to inspect all information obtained and/or recorded in the course of the telemedicine visit and may receive copies of available information for a reasonable fee.  I understand that some of the potential risks of receiving the Services via telemedicine include:   Delay or interruption in medical evaluation due to technological equipment failure or disruption;  Information transmitted may not be sufficient (e.g. poor resolution of images) to allow for appropriate medical decision making by the Practitioner; and/or   In rare instances, security protocols could fail, causing a breach of personal health information.  Furthermore, I acknowledge that it is my responsibility to provide information about my medical history, conditions and care that is complete and accurate to the best of my ability. I acknowledge that Practitioner's advice, recommendations, and/or decision may be based on factors not within their control, such as incomplete or inaccurate data provided by me or distortions of diagnostic images or specimens that may result from  electronic transmissions. I understand that the practice of medicine is not an exact science and that Practitioner makes no warranties or guarantees regarding treatment outcomes. I acknowledge that I will receive a copy of this consent concurrently upon execution via email to the email address I last provided but may also request a printed copy by calling the office of [facility].    I understand that my insurance will be billed for this visit.   I have read or had this consent read to me.  I understand the contents of this consent, which adequately explains the benefits and risks of the Services being provided via telemedicine.   I have been provided ample opportunity to ask  questions regarding this consent and the Services and have had my questions answered to my satisfaction.  I give my informed consent for the services to be provided through the use of telemedicine in my medical care  By participating in this telemedicine visit I agree to the above.

## 2018-12-31 NOTE — Telephone Encounter (Signed)
Left VM to call back regarding med changes per DM

## 2018-12-31 NOTE — Telephone Encounter (Signed)
See phone note from today, Left message to call back

## 2019-01-02 ENCOUNTER — Telehealth: Payer: 59 | Admitting: Cardiology

## 2019-01-06 ENCOUNTER — Other Ambulatory Visit (HOSPITAL_COMMUNITY): Payer: Self-pay

## 2019-01-06 MED ORDER — TORSEMIDE 20 MG PO TABS: ORAL_TABLET | ORAL | 3 refills | Status: DC

## 2019-01-06 NOTE — Telephone Encounter (Signed)
Relayed med changes to pt, sent script to confirmed pharm. Sent BMET script to [facility] in [place] per pt request.

## 2019-01-22 ENCOUNTER — Other Ambulatory Visit (HOSPITAL_COMMUNITY): Payer: Self-pay

## 2019-01-22 MED ORDER — UPTRAVI 1200 MCG PO TABS: 1200.0000 ug | ORAL_TABLET | Freq: Two times a day (BID) | ORAL | 11 refills | Status: DC

## 2019-01-22 NOTE — Telephone Encounter (Signed)
Received fax from [facility] with med up titration of Uptravi to 1200 mcg BID. Faxed to [facility] [phone]. Confirmation of receipt received.

## 2019-01-26 ENCOUNTER — Other Ambulatory Visit: Payer: Self-pay | Admitting: Family Medicine

## 2019-01-28 ENCOUNTER — Other Ambulatory Visit: Payer: Self-pay | Admitting: Family Medicine

## 2019-01-28 MED ORDER — TEMAZEPAM 15 MG PO CAPS: 15.0000 mg | ORAL_CAPSULE | Freq: Every evening | ORAL | 1 refills | Status: DC | PRN

## 2019-02-17 ENCOUNTER — Encounter (HOSPITAL_COMMUNITY): Payer: 59 | Admitting: Cardiology

## 2019-02-25 ENCOUNTER — Other Ambulatory Visit: Payer: Self-pay

## 2019-02-25 NOTE — Progress Notes (Signed)
58 y.o. G0P0000 Married  African American Fe here for annual exam. Menopausal no hot flashes or night sweats. Denies vaginal bleeding.Now on new medication for pulmonary hypertension and retaining fluid more, will be going [facility] for evaluation again. Seeing endocrine for hypothyroid management. Connective disease progresses and then decreases. Seems better at present. All labs with specialist. Will be married 20 years this October! No other health issues today.  Patient's last menstrual period was 10/31/2002.          Sexually active: Yes.    The current method of family planning is status post hysterectomy.    Exercising: Yes.    walking Smoker:  no  Review of Systems  Constitutional: Negative.   HENT: Negative.   Eyes: Negative.   Respiratory: Negative.   Cardiovascular: Negative.   Gastrointestinal: Negative.   Genitourinary: Negative.   Musculoskeletal: Negative.   Skin: Negative.   Neurological: Negative.   Endo/Heme/Allergies: Negative.   Psychiatric/Behavioral: Negative.     Health Maintenance: Pap:  02-19-18 negative, HR HPV negative           03-08-17 negative, HR HPV negative  History of Abnormal Pap: no MMG:  10-30-2018 density B/BIRADS 1 negative  Self Breast exams: yes Colonoscopy:  12/14 polyps, f/u 5 years has reminder  BMD:   2006  TDaP:  2016 Shingles: no Pneumonia: 2016 Hep C and HIV: both negative 2016 Labs: PCP   reports that she has never smoked. She has never used smokeless tobacco. She reports that she does not drink alcohol or use drugs.  Past Medical History:  Diagnosis Date  . Allergy   . Anemia   . Arthritis   . Asthma   . Chronic bronchitis ([place])   . Connective tissue disease ([place])   . Diabetes mellitus   . Endometriosis   . Fibroid uterus   . GERD (gastroesophageal reflux disease)   . History of Doppler ultrasound    a. Carotid US 8/08:Estimated stenosis in the right and left internal carotid arteries is 0-50% and 0-50%.  . History of  echocardiogram    a. Echo 6/16: EF 60-65%, no RWMA.  . History of hiatal hernia   . History of nuclear stress test    a. Myoview 6/16: There is no resting or stress perfusion defect consistent with no prior infarct and no ischemia. EF 73 %  The patient was hypertensive prior and during the study. Cardiac cath 04/2016 showed normal coronary arteries  . Hx of iron deficiency anemia   . Hypersomnia with sleep apnea   . Hypertension   . Lupus ([place]) 07/2013   Of Skin  . Menorrhagia   . Obesity   . Pneumonia   . 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 Secondary hyperthyroidism   . Thyroid disease   . Tinnitus    left ear  . Tubular adenoma of colon   . Vertigo   . Vitamin D deficiency     Past Surgical History:  Procedure Laterality Date  . ABDOMINAL HYSTERECTOMY  2004   supracervical hysterectomy  . BREAST SURGERY  2005   reduction  . BREATH TEK H PYLORI [name] 12/24/2014   Procedure: BREATH TEK H PYLORI;  Surgeon: Greer Pickerel, MD;  Location: [facility];  Service: General;  Laterality: [name];  . CARDIAC CATHETERIZATION [name] 03/22/2016   Procedure: Right/Left Heart Cath and Coronary Angiography;  Surgeon: Troy Sine, MD;  Location: [facility];  Service: Cardiovascular;  Laterality: [name];  . DILATION AND CURETTAGE  OF UTERUS     age 50  . ESOPHAGOGASTRODUODENOSCOPY (EGD) WITH PROPOFOL [name] 10/14/2015   Procedure: ESOPHAGOGASTRODUODENOSCOPY (EGD) WITH PROPOFOL;  Surgeon: Clarene Essex, MD;  Location: [facility];  Service: Endoscopy;  Laterality: [name];  . LAPAROSCOPIC GASTRIC SLEEVE RESECTION WITH HIATAL HERNIA REPAIR [name] 07/05/2015   Procedure: LAPAROSCOPIC GASTRIC SLEEVE RESECTION WITH HIATAL HERNIA REPAIR, upper endoscopy;  Surgeon: Greer Pickerel, MD;  Location: [facility];  Service: General;  Laterality: [name];  . REDUCTION MAMMAPLASTY Bilateral   . RIGHT HEART CATH [name] 10/13/2018   Procedure: RIGHT HEART CATH;  Surgeon: Larey Dresser, MD;  Location: [facility];  Service:  Cardiovascular;  Laterality: [name];    Current Outpatient Medications  Medication Sig Dispense Refill  . acetaminophen (TYLENOL) 500 MG tablet Take 500-1,000 mg by mouth every 6 (six) hours as needed for headache.     [name] ambrisentan (LETAIRIS) 5 MG tablet Take 1 tablet (5 mg total) by mouth daily. 90 tablet 3  . aspirin EC 81 MG tablet Take 1 tablet (81 mg total) by mouth daily.    . benzonatate (TESSALON) 100 MG capsule Take 1 capsule (100 mg total) by mouth 2 (two) times daily as needed for cough. 20 capsule 0  . CALCIUM-MAGNESIUM-VITAMIN D PO Take 1 tablet by mouth 2 (two) times daily.    . ergocalciferol (VITAMIN D2) 50000 units capsule Take 1 capsule (50,000 Units total) by mouth once a week. One capsule once weekly 12 capsule 1  . levothyroxine (SYNTHROID, LEVOTHROID) 75 MCG tablet TAKE 1 TABLET (75 MCG TOTAL) BY MOUTH DAILY BEFORE BREAKFAST. 30 tablet 6  . Multiple Vitamin (MULTIVITAMIN WITH MINERALS) TABS tablet Take 1 tablet by mouth daily.    . mycophenolate (MYFORTIC) 360 MG TBEC EC tablet Take 720 mg by mouth 2 (two) times daily.     . nitroGLYCERIN (NITROSTAT) 0.4 MG SL tablet Place 0.4 mg under the tongue every 5 (five) minutes x 3 doses as needed for chest pain.     . pantoprazole (PROTONIX) 20 MG tablet Take 20 mg by mouth See admin instructions. Take 1 tablet (20 mg) by mouth scheduled each morning, may take an additional dose in the evening if needed for heartburn/indigestion.    . potassium chloride (K-DUR) 10 MEQ tablet Take 2 tablets by mouth twice daily 390 tablet 1  . Selexipag (UPTRAVI) 1200 MCG TABS Take 1,200 mcg by mouth every 12 (twelve) hours. 60 tablet 11  . temazepam (RESTORIL) 15 MG capsule TAKE 1 CAPSULE (15 MG TOTAL) BY MOUTH AT BEDTIME AS NEEDED FOR SLEEP. 30 capsule 0  . torsemide (DEMADEX) 20 MG tablet Take 2 tablets (40 mg total) by mouth every morning AND 1 tablet (20 mg total) at bedtime. 270 tablet 3  . triamcinolone ointment (KENALOG) 0.1 % Apply 1  application topically 2 (two) times daily as needed (dry/irritated skin.). APPLY AS DIRECTED TO ITCHING SPOTS ON THE BODY DAILY TO TWICE DAILY AS NEEDED. NOT TO FACE.     [name] UNABLE TO FIND Overnight Pulse Oximetry (to evaluate the need for continued nocturnal oxygen)  DX I27.20 1 each 0   No current facility-administered medications for this visit.     Family History  Problem Relation Age of Onset  . Hypertension Mother   . Diabetes Mother   . Arthritis Mother   . Depression Mother   . CAD Mother        s/p PCI  . Diabetes Father   . Stroke Father   .  Arthritis Father   . Depression Father   . Hypertension Father   . Cancer Father        prostate  . Hypertension Sister        x 2  . Breast cancer Sister   . Arthritis Sister   . Bleeding Disorder Sister   . Depression Sister   . Thyroid disease Sister   . Colon cancer Maternal Grandfather   . Stroke Maternal Aunt   . Heart attack Neg Hx     ROS:  Pertinent items are noted in HPI.  Otherwise, a comprehensive ROS was negative.  Exam:   BP 126/66 (BP Location: Left Arm, Patient Position: Sitting, Cuff Size: Large)   Pulse (!) 108   Temp (!) 97.4 F (36.3 C) (Temporal)   Resp 18   Ht 4\' 11"  (1.499 m)   Wt 201 lb (91.2 kg)   LMP 10/31/2002   BMI 40.60 kg/m  Height: 4\' 11"  (149.9 cm) Ht Readings from Last 3 Encounters:  02/27/19 4\' 11"  (1.499 m)  10/13/18 5' (1.524 m)  09/30/18 5' (1.524 m)    General appearance: alert, cooperative and appears stated age Head: Normocephalic, without obvious abnormality, atraumatic Neck: no adenopathy, supple, symmetrical, trachea midline and thyroid normal to inspection and palpation Lungs: clear to auscultation bilaterally Breasts: normal appearance, no masses or tenderness, No nipple retraction or dimpling, No nipple discharge or bleeding, No axillary or supraclavicular adenopathy, decrease pigmentation  noted on upper chest (known) no change Heart: regular rate and  rhythm Abdomen: soft, non-tender; no masses,  no organomegaly Extremities: extremities normal, atraumatic, no cyanosis or edema Skin: Skin color, texture, turgor normal. No rashes or lesions Lymph nodes: Cervical, supraclavicular, and axillary nodes normal. No abnormal inguinal nodes palpated Neurologic: Grossly normal   Pelvic: External genitalia:  no lesions              Urethra:  normal appearing urethra with no masses, tenderness or lesions              Bartholin's and Skene's: normal                 Vagina: normal appearing vagina with normal color and discharge, no lesions              Cervix: no cervical motion tenderness and no lesions              Pap taken: No. Bimanual Exam:  Uterus:  uterus absent              Adnexa: normal adnexa and no mass, fullness, tenderness               Rectovaginal: Confirms               Anus:  normal sphincter tone, no lesions  Chaperone present: yes  A:  Well Woman with normal exam  Menopausal s/p TAH supracervical with BSO  Pulmonary hypertension, connective tissue disease, Hypothyroid management with specialist at [facility]  Colonoscopy and mammogram due P:   Reviewed health and wellness pertinent to exam  Discussed importance of notifying if vaginal bleeding.  Continue follow up with specialist as indicated.  Stressed importance of colonoscopy due to previous polyp and mammogram.  Patient will schedule.  Pap smear: no   counseled on breast self exam, mammography screening, feminine hygiene, adequate intake of calcium and vitamin D, diet and exercise, Kegel's exercises  return annually or prn  An After Visit Summary was printed and  given to the patient.

## 2019-02-27 ENCOUNTER — Encounter: Payer: Self-pay | Admitting: Certified Nurse Midwife

## 2019-02-27 ENCOUNTER — Other Ambulatory Visit: Payer: Self-pay

## 2019-02-27 ENCOUNTER — Ambulatory Visit: Payer: 59 | Admitting: Certified Nurse Midwife

## 2019-02-27 VITALS — BP 126/66 | HR 108 | Temp 97.4°F | Resp 18 | Ht 59.0 in | Wt 201.0 lb

## 2019-02-27 DIAGNOSIS — Z01419 Encounter for gynecological examination (general) (routine) without abnormal findings: Secondary | ICD-10-CM

## 2019-02-27 DIAGNOSIS — Z8679 Personal history of other diseases of the circulatory system: Secondary | ICD-10-CM

## 2019-02-27 DIAGNOSIS — Z8739 Personal history of other diseases of the musculoskeletal system and connective tissue: Secondary | ICD-10-CM | POA: Diagnosis not present

## 2019-02-27 DIAGNOSIS — N951 Menopausal and female climacteric states: Secondary | ICD-10-CM | POA: Diagnosis not present

## 2019-03-03 ENCOUNTER — Other Ambulatory Visit: Payer: Self-pay | Admitting: "Endocrinology

## 2019-03-05 ENCOUNTER — Other Ambulatory Visit: Payer: Self-pay

## 2019-03-05 DIAGNOSIS — Z20822 Contact with and (suspected) exposure to covid-19: Secondary | ICD-10-CM

## 2019-03-07 LAB — NOVEL CORONAVIRUS, NAA: SARS-CoV-2, NAA: NOT DETECTED

## 2019-03-10 ENCOUNTER — Other Ambulatory Visit: Payer: Self-pay

## 2019-03-10 ENCOUNTER — Encounter: Payer: Self-pay | Admitting: Family Medicine

## 2019-03-10 ENCOUNTER — Ambulatory Visit: Payer: 59 | Admitting: Family Medicine

## 2019-03-10 VITALS — BP 110/80 | HR 87 | Resp 15 | Ht 59.0 in | Wt 199.0 lb

## 2019-03-10 DIAGNOSIS — I2721 Secondary pulmonary arterial hypertension: Secondary | ICD-10-CM | POA: Diagnosis not present

## 2019-03-10 DIAGNOSIS — D126 Benign neoplasm of colon, unspecified: Secondary | ICD-10-CM

## 2019-03-10 DIAGNOSIS — K219 Gastro-esophageal reflux disease without esophagitis: Secondary | ICD-10-CM

## 2019-03-10 DIAGNOSIS — M349 Systemic sclerosis, unspecified: Secondary | ICD-10-CM | POA: Diagnosis not present

## 2019-03-10 DIAGNOSIS — E038 Other specified hypothyroidism: Secondary | ICD-10-CM

## 2019-03-10 MED ORDER — TEMAZEPAM 15 MG PO CAPS: 15.0000 mg | ORAL_CAPSULE | Freq: Every evening | ORAL | 4 refills | Status: DC | PRN

## 2019-03-10 NOTE — Patient Instructions (Signed)
Follow-up in early December with MD, call if you need me sooner.  You are being referred to GI both for management of uncontrolled reflux as well as for your colonoscopy.  You are referred to Dr. Dorris Fetch to follow-up thyroid disease.  No changes in medication at this time your current Protonix allows you to take up to 2/day so since symptoms are uncontrolled to this and keep follow-up visit with the stomach specialist once an appointment is made.  Thanks for choosing [facility], we consider it a privelige to serve you. Social distancing. Frequent hand washing with soap and water Keeping your hands off of your face. These 3 practices will help to keep both you and your community healthy during this time. Please practice them faithfully!

## 2019-03-12 ENCOUNTER — Ambulatory Visit (HOSPITAL_COMMUNITY)
Admission: RE | Admit: 2019-03-12 | Discharge: 2019-03-12 | Disposition: A | Discharge status: Home / Self Care | Payer: 59 | Source: Ambulatory Visit | Attending: Cardiology | Admitting: Cardiology

## 2019-03-12 ENCOUNTER — Other Ambulatory Visit: Payer: Self-pay

## 2019-03-12 ENCOUNTER — Encounter (HOSPITAL_COMMUNITY): Payer: Self-pay | Admitting: Cardiology

## 2019-03-12 VITALS — BP 116/88 | HR 72 | Wt 198.8 lb

## 2019-03-12 DIAGNOSIS — E669 Obesity, unspecified: Secondary | ICD-10-CM | POA: Insufficient documentation

## 2019-03-12 DIAGNOSIS — Z8249 Family history of ischemic heart disease and other diseases of the circulatory system: Secondary | ICD-10-CM | POA: Insufficient documentation

## 2019-03-12 DIAGNOSIS — E039 Hypothyroidism, unspecified: Secondary | ICD-10-CM | POA: Insufficient documentation

## 2019-03-12 DIAGNOSIS — R0602 Shortness of breath: Secondary | ICD-10-CM | POA: Diagnosis not present

## 2019-03-12 DIAGNOSIS — Z7982 Long term (current) use of aspirin: Secondary | ICD-10-CM | POA: Diagnosis not present

## 2019-03-12 DIAGNOSIS — R0609 Other forms of dyspnea: Secondary | ICD-10-CM | POA: Insufficient documentation

## 2019-03-12 DIAGNOSIS — J849 Interstitial pulmonary disease, unspecified: Secondary | ICD-10-CM | POA: Insufficient documentation

## 2019-03-12 DIAGNOSIS — I7300 Raynaud's syndrome without gangrene: Secondary | ICD-10-CM | POA: Diagnosis not present

## 2019-03-12 DIAGNOSIS — I272 Pulmonary hypertension, unspecified: Secondary | ICD-10-CM | POA: Diagnosis not present

## 2019-03-12 DIAGNOSIS — I2721 Secondary pulmonary arterial hypertension: Secondary | ICD-10-CM | POA: Diagnosis not present

## 2019-03-12 DIAGNOSIS — R9431 Abnormal electrocardiogram [ECG] [EKG]: Secondary | ICD-10-CM

## 2019-03-12 DIAGNOSIS — Z79899 Other long term (current) drug therapy: Secondary | ICD-10-CM | POA: Insufficient documentation

## 2019-03-12 DIAGNOSIS — Q211 Atrial septal defect: Secondary | ICD-10-CM | POA: Diagnosis not present

## 2019-03-12 DIAGNOSIS — R0601 Orthopnea: Secondary | ICD-10-CM | POA: Diagnosis not present

## 2019-03-12 DIAGNOSIS — I5032 Chronic diastolic (congestive) heart failure: Secondary | ICD-10-CM | POA: Diagnosis not present

## 2019-03-12 DIAGNOSIS — Z6841 Body Mass Index (BMI) 40.0 and over, adult: Secondary | ICD-10-CM | POA: Insufficient documentation

## 2019-03-12 DIAGNOSIS — M349 Systemic sclerosis, unspecified: Secondary | ICD-10-CM | POA: Insufficient documentation

## 2019-03-12 LAB — BASIC METABOLIC PANEL
Anion gap: 13 (ref 5–15)
BUN: 26 mg/dL — ABNORMAL HIGH (ref 6–20)
CO2: 25 mmol/L (ref 22–32)
Calcium: 6.5 mg/dL — ABNORMAL LOW (ref 8.9–10.3)
Chloride: 102 mmol/L (ref 98–111)
Creatinine, Ser: 1.41 mg/dL — ABNORMAL HIGH (ref 0.44–1.00)
GFR calc Af Amer: 48 mL/min — ABNORMAL LOW (ref >60)
GFR calc non Af Amer: 42 mL/min — ABNORMAL LOW (ref >60)
Glucose, Bld: 94 mg/dL (ref 70–99)
Potassium: 3.5 mmol/L (ref 3.5–5.1)
Sodium: 140 mmol/L (ref 135–145)

## 2019-03-12 MED ORDER — TORSEMIDE 20 MG PO TABS: 40.0000 mg | ORAL_TABLET | Freq: Every day | ORAL | 3 refills | Status: DC

## 2019-03-12 MED ORDER — BISOPROLOL FUMARATE 5 MG PO TABS: 2.5000 mg | ORAL_TABLET | Freq: Every day | ORAL | 3 refills | Status: DC

## 2019-03-12 NOTE — Progress Notes (Signed)
700 yards completed in 6 min walk.  93% & 78HR begin  89% & 101HR end

## 2019-03-12 NOTE — Progress Notes (Signed)
Called pt post OV to inform her of DM reviewing her EKG, advised to begin Bisoperlol 2.5 mg (0.5 tab) daily for long QT interval

## 2019-03-12 NOTE — Progress Notes (Signed)
Date:  03/12/2019   ID:  Rosaly, Labarbera [birth year], MRN [id number]   Provider location: [facility] Type of Visit: Established patient  PCP:  Fayrene Helper, MD  Cardiologist:  Dr. Aundra Dubin   History of Present Illness: Sabrina [name] is a 58 y.o. female who has a history of scleroderma with associated mild ILD, Raynauds, and pulmonary hypertension.  She was referred by Dr. Radford Pax and had [facility] in 3/20 that showed severe pulmonary arterial hypertension with preserved cardiac output.  She has had the scleroderma diagnosis for > 10 years.  She is on mycophenolate.  Pulmonary hypertension is not a new diagnosis, she has been on ambrisentan.  She is followed by rheumatology at [facility].    After RHC, I had her increase her ambrisentan to 10 mg daily.    She did not tolerate increase in ambrisentan well, developed peripheral edema and weight went up.  I had her decrease the ambrisentan back to 5 mg daily and edema resolved.  She is now on Uptravi, dose has been increased up to 1200 mcg bid.   Patient returns for followup of pulmonary hypertension and RV failure.  She reports increased exertional dyspnea.  She is short of breath after walking 50 feeet or walking up stairs.  She has orthopnea, sleeps on 2 pillows.  No lightheadedness/syncope.  Weight is up about 5 lbs at home.  She is only taking torsemide 20 mg daily.    Labs (3/20): K 3.8, creatinine 1.64 Labs (4/20): TSH and free T4 normal, K 3.8, creatinine 1.14  ECG (personally reviewed): NSR, right axis deviation, RVH, inferolateral TWIs, QTc 520 msec  6 minute walk (7/20): 213 m  PMH: 1. Pulmonary hypertension: Thought to be related to scleroderma.   - V/Q scan (2017): No evidence for chronic PE.  - Sleep study (2017): Negative - Echo (1/20, [facility]): EF > 55%, moderately dilated RV with moderately decreased systolic function, moderate TR, positive bubble study.  - RHC (3/20): mean RA 11, PA  128/35 mean 69, mean PCWP 15, CI 2.32, PVR 12.6 WU.  - Cannot tolerate 10 mg ambrisentan due to edema.  2. Suspect PFO: positive bubble study.  3. HTN 4. Obese 5. Scleroderma: +ANA, +anti-centromere antibody.  Diagnosis x 10 years.  Has associated Raynauds, mild ILD, and severe PAH.  6. Long QT syndrome: Has been on beta blocker.  7. 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  - PFTs (4/19): FVC 63%, FEV1 70%, ratio 109%, TLC 67%, DLCO 60% 8. Hypothyroidism    Current Outpatient Medications  Medication Sig Dispense Refill  . acetaminophen (TYLENOL) 500 MG tablet Take 500-1,000 mg by mouth every 6 (six) hours as needed for headache.     [name] ambrisentan (LETAIRIS) 5 MG tablet Take 1 tablet (5 mg total) by mouth daily. 90 tablet 3  . aspirin EC 81 MG tablet Take 1 tablet (81 mg total) by mouth daily.    [name] CALCIUM-MAGNESIUM-VITAMIN D PO Take 1 tablet by mouth 2 (two) times daily.    . ergocalciferol (VITAMIN D2) 50000 units capsule Take 1 capsule (50,000 Units total) by mouth once a week. One capsule once weekly 12 capsule 1  . levothyroxine (SYNTHROID) 75 MCG tablet TAKE 1 TABLET (75 MCG TOTAL) BY MOUTH DAILY BEFORE BREAKFAST. 90 tablet 2  . Multiple Vitamin (MULTIVITAMIN WITH MINERALS) TABS tablet Take 1 tablet by mouth daily.    . mycophenolate (MYFORTIC) 360 MG TBEC EC tablet Take  720 mg by mouth 2 (two) times daily.     . nitroGLYCERIN (NITROSTAT) 0.4 MG SL tablet Place 0.4 mg under the tongue every 5 (five) minutes x 3 doses as needed for chest pain.     . pantoprazole (PROTONIX) 20 MG tablet Take 20 mg by mouth See admin instructions. Take 1 tablet (20 mg) by mouth scheduled each morning, may take an additional dose in the evening if needed for heartburn/indigestion.    . potassium chloride (K-DUR) 10 MEQ tablet Take 2 tablets by mouth twice daily 390 tablet 1  . Selexipag (UPTRAVI) 1200 MCG TABS Take 1,200 mcg by mouth every 12 (twelve) hours. 60 tablet 11  . temazepam  (RESTORIL) 15 MG capsule Take 1 capsule (15 mg total) by mouth at bedtime as needed for sleep. 30 capsule 4  . torsemide (DEMADEX) 20 MG tablet Take 2 tablets (40 mg total) by mouth daily. 180 tablet 3  . triamcinolone ointment (KENALOG) 0.1 % Apply 1 application topically 2 (two) times daily as needed (dry/irritated skin.). APPLY AS DIRECTED TO ITCHING SPOTS ON THE BODY DAILY TO TWICE DAILY AS NEEDED. NOT TO FACE.     [name] UNABLE TO FIND Overnight Pulse Oximetry (to evaluate the need for continued nocturnal oxygen)  DX I27.20 1 each 0  . bisoprolol (ZEBETA) 5 MG tablet Take 0.5 tablets (2.5 mg total) by mouth daily. 45 tablet 3   No current facility-administered medications for this encounter.     Allergies:   Peanut oil, Potassium iodide, Ace inhibitors, Doxycycline, Peanut-containing drug products, Amlodipine, Codeine, Latex, and Penicillins   Social History:  The patient  reports that she has never smoked. She has never used smokeless tobacco. She reports that she does not drink alcohol or use drugs.   Family History:  The patient's family history includes Arthritis in her father, mother, and sister; Bleeding Disorder in her sister; Breast cancer in her sister; CAD in her mother; Cancer in her father; Colon cancer in her maternal grandfather; Depression in her father, mother, and sister; Diabetes in her father and mother; Hypertension in her father, mother, and sister; Stroke in her father and maternal aunt; Thyroid disease in her sister.   ROS:  Please see the history of present illness.   All other systems are personally reviewed and negative.   Exam:   BP 116/88   Pulse 72   Wt 90.2 kg (198 lb 12.8 oz)   LMP 10/31/2002   SpO2 98%   BMI 40.15 kg/m  General: NAD Neck: JVP 9-10 cm, no thyromegaly or thyroid nodule.  Lungs: Clear to auscultation bilaterally with normal respiratory effort. CV: Nondisplaced PMI.  Heart regular S1/S2, no S3/S4, no murmur.  1+ ankle edema.  No carotid bruit.   Normal pedal pulses.  Abdomen: Soft, nontender, no hepatosplenomegaly, no distention.  Skin: Intact without lesions or rashes.  Neurologic: Alert and oriented x 3.  Psych: Normal affect. Extremities: No clubbing or cyanosis.  HEENT: Normal.    Recent Labs: 08/19/2018: Magnesium 2.1 10/07/2018: Platelets 344 10/13/2018: Hemoglobin 10.2 12/09/2018: ALT 9; TSH 1.59 03/12/2019: BUN 26; Creatinine, Ser 1.41; Potassium 3.5; Sodium 140  Personally reviewed   Wt Readings from Last 3 Encounters:  03/12/19 90.2 kg (198 lb 12.8 oz)  03/10/19 90.3 kg (199 lb)  02/27/19 91.2 kg (201 lb)      ASSESSMENT AND PLAN:  1.  Pulmonary hypertension: Severe PAH with preserved cardiac output by cath in 3/20 (systemic pressure).  Suspect group 1 pulmonary  hypertension related to scleroderma.  Has been known for several years.  In 2017, V/Q study was negative for chronic PE and sleep study was negative.  PFTs suggestive of restriction, has mild ILD on CT. Echo in 1/20 showed moderately dilated RV with moderately decreased systolic function.  She is on ambrisentan 5 mg daily, developed significant edema at 10 mg daily.  Malvin Johns has been increased to 1200 mcg bid.  On exam today, she is volume overloaded with NYHA class III symptoms.  6 minute walk done today as above.  - Continue ambrisentan 5 mg daily, will not increase.  - Continue to titrate up selexipag as tolerated, currently at 1200 mcg bid.  - Adcirca will be the next step but would like to stabilize volume first.  - Increase torsemide to 40 qam/20 qpm x 3 days then 40 mg daily after that.  BMET today and BMET again in 10 days.     - Will see how she does with aggressive treatment, may end up needing parenteral therapy.  2. Scleroderma: Followed by rheumatology at [facility].  She is on Mycophenolate.  She has mild interstitial lung disease, pulmonary hypertension, and Raynauds phenomenon.  3. Interstitial lung disease: Mild by imaging, PFTs suggest restriction.   Likely related to scleroderma.  4. PFO: Noted by bubble study in past.  5. Long QT syndrome: Has been followed by EP in the past, not currently on a beta blocker. QTc 520 msec on ECG today.  She did not tolerate metoprolol. - I will try her on bisoprolol 2.5 mg daily.   Signed, Loralie Champagne, MD  03/12/2019  [place] [address] [facility] [place] [address] [phone] (office) [phone] (fax)

## 2019-03-12 NOTE — Patient Instructions (Signed)
Labs were done today. We will call you with any ABNORMAL results. No news is good news!  Please follow up with repeat labs in 10 days.  EKG was completed.   6 minute walk was completed.   INCREASE Torsemide to 40 mg (2 tabs) in the morning and 20 mg (1 tab) in the evening FOR THREE DAYS ONLY. Then take 40 mg (2 tabs) daily.   Your physician recommends that you schedule a follow-up appointment in: 1 month.  At the [facility], you and your health needs are our priority. As part of our continuing mission to provide you with exceptional heart care, we have created designated Provider Care Teams. These Care Teams include your primary Cardiologist (physician) and Advanced Practice Providers (APPs- Physician Assistants and Nurse Practitioners) who all work together to provide you with the care you need, when you need it.   You may see any of the following providers on your designated Care Team at your next follow up: [name] Dr Glori Bickers . Dr Loralie Champagne . Darrick Grinder, NP

## 2019-03-14 ENCOUNTER — Encounter: Payer: Self-pay | Admitting: Family Medicine

## 2019-03-14 NOTE — Assessment & Plan Note (Signed)
Severe, managed by pulmonology at [facility]

## 2019-03-14 NOTE — Assessment & Plan Note (Signed)
rept colonoscopy needs to be rscheduled

## 2019-03-14 NOTE — Assessment & Plan Note (Signed)
Uncontrolled sympotoms, refer to gI for eval and management

## 2019-03-14 NOTE — Assessment & Plan Note (Signed)
Followed by Endo, needs follow up appt

## 2019-03-14 NOTE — Progress Notes (Signed)
   Sabrina [name]     MRN: [id number]      DOB: [birth year]   HPI Sabrina [name] is here for follow up and re-evaluation of chronic medical conditions, medication management and review of any available recent lab and radiology data.  Preventive health is updated, specifically  Cancer screening and Immunization.   Questions or concerns regarding consultations or procedures which the PT has had in the interim are  addressed. The PT denies any adverse reactions to current medications since the last visit.  C/o increased fatigue and poor exercise tolerance , seriously considering disability C/o increased skin outbreaks, on upper and lower ext as well as her trunk  ROS Denies recent fever or chills.c/o chronic and worsening fatigue and poor exercise tolernce Denies sinus pressure, nasal congestion, ear pain or sore throat. Denies chest congestion, productive cough  Denies chest pains, palpitations and leg swelling Denies abdominal pain, nausea, vomiting,diarrhea or constipation.   Denies dysuria, frequency, hesitancy or incontinence. C/o limitation in mobility. Denies headaches, seizures, numbness, or tingling. Mild  depression,  anxiety and insomnia.Good response to mediation and states her spouse is a good suporc/o uncontrolled reflux symptoms, nocturnal cough, and food seeming to stick when swallowing  PE  BP 110/80   Pulse 87   Resp 15   Ht 4\' 11"  (1.499 m)   Wt 199 lb (90.3 kg)   LMP 10/31/2002   SpO2 93%   BMI 40.19 kg/m   Patient alert and oriented and in no cardiopulmonary distress.  HEENT: No facial asymmetry, EOMI,   oropharynx pink and moist.  Neck supple no JVD, no mass.  Chest: Clear to auscultation bilaterally.  CVS: S1, S2 no murmurs, no S3.Regular rate.  ABD: Soft non tender.   Ext: No edema  MS: Adequate ROM spine, shoulders, hips and knees.  Skin: Intact, rash noted.  Psych: Good eye contact, normal affect. Memory intact mildly  anxious and depressed  appearing.  CNS: CN 2-12 intact, power,  normal throughout.no focal deficits noted.   Assessment & Plan  Tubular adenoma of colon rept colonoscopy needs to be rscheduled  Scleroderma ([place]) Progression of skin involvement is noted. Breathing currently stable but noted to be slowly declining  PAH (pulmonary artery hypertension) (H[facility]) Severe, managed by pulmonology at [facility])  Patient re-educated about  the importance of commitment to a  minimum of 150 minutes of exercise per week as able.  The importance of healthy food choices with portion control discussed, as well as eating regularly and within a 12 hour window most days. The need to choose "clean , green" food 50 to 75% of the time is discussed, as well as to make water the primary drink and set a goal of 64 ounces water daily.    Weight /BMI 03/12/2019 03/10/2019 02/27/2019  WEIGHT 198 lb 12.8 oz 199 lb 201 lb  HEIGHT - 4\' 11"  4\' 11"   BMI 40.15 kg/m2 40.19 kg/m2 40.6 kg/m2      Hypothyroidism Followed by Endo, needs follow up appt

## 2019-03-14 NOTE — Assessment & Plan Note (Signed)
Progression of skin involvement is noted. Breathing currently stable but noted to be slowly declining

## 2019-03-14 NOTE — Assessment & Plan Note (Signed)
  Patient re-educated about  the importance of commitment to a  minimum of 150 minutes of exercise per week as able.  The importance of healthy food choices with portion control discussed, as well as eating regularly and within a 12 hour window most days. The need to choose "clean , green" food 50 to 75% of the time is discussed, as well as to make water the primary drink and set a goal of 64 ounces water daily.    Weight /BMI 03/12/2019 03/10/2019 02/27/2019  WEIGHT 198 lb 12.8 oz 199 lb 201 lb  HEIGHT - 4\' 11"  4\' 11"   BMI 40.15 kg/m2 40.19 kg/m2 40.6 kg/m2

## 2019-03-17 ENCOUNTER — Ambulatory Visit: Payer: 59 | Admitting: "Endocrinology

## 2019-03-25 ENCOUNTER — Ambulatory Visit (INDEPENDENT_AMBULATORY_CARE_PROVIDER_SITE_OTHER): Payer: 59 | Admitting: "Endocrinology

## 2019-03-25 ENCOUNTER — Encounter: Payer: Self-pay | Admitting: "Endocrinology

## 2019-03-25 ENCOUNTER — Other Ambulatory Visit: Payer: Self-pay

## 2019-03-25 DIAGNOSIS — E039 Hypothyroidism, unspecified: Secondary | ICD-10-CM

## 2019-03-25 MED ORDER — CALCIUM-MAGNESIUM-VITAMIN D 500-250-125 MG-MG-UNIT PO TABS: 2.0000 | ORAL_TABLET | Freq: Two times a day (BID) | ORAL | 12 refills | Status: DC

## 2019-03-25 NOTE — Progress Notes (Signed)
03/25/2019                                Endocrinology Telehealth Visit Follow up Note -During COVID -19 Pandemic  I connected with Sabrina [name] on 03/25/2019   by telephone and verified that I am speaking with the correct person using two identifiers. [place], [birth year]. she has verbally consented to this visit. All issues noted in this document were discussed and addressed. The format was not optimal for physical exam.  Subjective:    Patient ID: Sabrina [name], female    DOB: 1960/11/25, PCP Fayrene Helper, MD   Past Medical History:  Diagnosis Date  . Allergy   . Anemia   . Arthritis   . Asthma   . Chronic bronchitis ([place])   . Connective tissue disease ([place])   . Diabetes mellitus   . Endometriosis   . Fibroid uterus   . GERD (gastroesophageal reflux disease)   . History of Doppler ultrasound    a. Carotid US 8/08:Estimated stenosis in the right and left internal carotid arteries is 0-50% and 0-50%.  . History of echocardiogram    a. Echo 6/16: EF 60-65%, no RWMA.  . History of hiatal hernia   . History of nuclear stress test    a. Myoview 6/16: There is no resting or stress perfusion defect consistent with no prior infarct and no ischemia. EF 73 %  The patient was hypertensive prior and during the study. Cardiac cath 04/2016 showed normal coronary arteries  . Hx of iron deficiency anemia   . Hypersomnia with sleep apnea   . Hypertension   . 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 Menorrhagia   . Obesity   . Pneumonia   . 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 Secondary hyperthyroidism   . Thyroid disease   . Tinnitus    left ear  . Tubular adenoma of colon   . Vertigo   . Vitamin D deficiency    Past Surgical History:  Procedure Laterality Date  . ABDOMINAL HYSTERECTOMY  2004   supracervical hysterectomy  . BREAST SURGERY  2005   reduction  . BREATH TEK H PYLORI [name] 12/24/2014   Procedure: BREATH TEK H PYLORI;  Surgeon: Greer Pickerel,  MD;  Location: [facility];  Service: General;  Laterality: [name];  . CARDIAC CATHETERIZATION [name] 03/22/2016   Procedure: Right/Left Heart Cath and Coronary Angiography;  Surgeon: Troy Sine, MD;  Location: [facility];  Service: Cardiovascular;  Laterality: [name];  . DILATION AND CURETTAGE OF UTERUS     age 41  . ESOPHAGOGASTRODUODENOSCOPY (EGD) WITH PROPOFOL [name] 10/14/2015   Procedure: ESOPHAGOGASTRODUODENOSCOPY (EGD) WITH PROPOFOL;  Surgeon: Clarene Essex, MD;  Location: [facility];  Service: Endoscopy;  Laterality: [name];  . LAPAROSCOPIC GASTRIC SLEEVE RESECTION WITH HIATAL HERNIA REPAIR [name] 07/05/2015   Procedure: LAPAROSCOPIC GASTRIC SLEEVE RESECTION WITH HIATAL HERNIA REPAIR, upper endoscopy;  Surgeon: Greer Pickerel, MD;  Location: [facility];  Service: General;  Laterality: [name];  . REDUCTION MAMMAPLASTY Bilateral   . RIGHT HEART CATH [name] 10/13/2018   Procedure: RIGHT HEART CATH;  Surgeon: Larey Dresser, MD;  Location: [facility];  Service: Cardiovascular;  Laterality: [name];   Social History   Socioeconomic History  . Marital status: Married    Spouse name: Not on file  . Number of children: Not on file  . Years of education:  13  . Highest education level: Not on file  Occupational History  . Occupation: Therapist, music: Radio broadcast assistant  Social Needs  . Financial resource strain: Not on file  . Food insecurity    Worry: Not on file    Inability: Not on file  . Transportation needs    Medical: Not on file    Non-medical: Not on file  Tobacco Use  . Smoking status: Never Smoker  . Smokeless tobacco: Never Used  Substance and Sexual Activity  . Alcohol use: No    Alcohol/week: 0.0 standard drinks  . Drug use: No  . Sexual activity: Yes    Partners: Male    Birth control/protection: Surgical    Comment: hysterectomy (supracervical)  Lifestyle  . Physical activity    Days per week: Not on file    Minutes per session: Not on file  .  Stress: Not on file  Relationships  . Social Herbalist on phone: Not on file    Gets together: Not on file    Attends religious service: Not on file    Active member of club or organization: Not on file    Attends meetings of clubs or organizations: Not on file    Relationship status: Not on file  Other Topics Concern  . Not on file  Social History Narrative   Regular exercise-no   Caffeine Use-no      [facility]: Patient lives with her husband. Works for [employer]. No bird or mold exposure. No pets currently.         Outpatient Encounter Medications as of 03/25/2019  Medication Sig  . acetaminophen (TYLENOL) 500 MG tablet Take 500-1,000 mg by mouth every 6 (six) hours as needed for headache.   [name] ambrisentan (LETAIRIS) 5 MG tablet Take 1 tablet (5 mg total) by mouth daily.  [name] aspirin EC 81 MG tablet Take 1 tablet (81 mg total) by mouth daily.  . bisoprolol (ZEBETA) 5 MG tablet Take 0.5 tablets (2.5 mg total) by mouth daily.  . Calcium-Magnesium-Vitamin D 940 005 3868 MG-MG-UNIT TABS Take 2 tablets by mouth 2 (two) times daily.  . ergocalciferol (VITAMIN D2) 50000 units capsule Take 1 capsule (50,000 Units total) by mouth once a week. One capsule once weekly  . levothyroxine (SYNTHROID) 75 MCG tablet TAKE 1 TABLET (75 MCG TOTAL) BY MOUTH DAILY BEFORE BREAKFAST.  . Multiple Vitamin (MULTIVITAMIN WITH MINERALS) TABS tablet Take 1 tablet by mouth daily.  . mycophenolate (MYFORTIC) 360 MG TBEC EC tablet Take 720 mg by mouth 2 (two) times daily.   . nitroGLYCERIN (NITROSTAT) 0.4 MG SL tablet Place 0.4 mg under the tongue every 5 (five) minutes x 3 doses as needed for chest pain.   . pantoprazole (PROTONIX) 20 MG tablet Take 20 mg by mouth See admin instructions. Take 1 tablet (20 mg) by mouth scheduled each morning, may take an additional dose in the evening if needed for heartburn/indigestion.  . potassium chloride (K-DUR) 10 MEQ tablet Take 2 tablets by mouth  twice daily  . Selexipag (UPTRAVI) 1200 MCG TABS Take 1,200 mcg by mouth every 12 (twelve) hours.  . temazepam (RESTORIL) 15 MG capsule Take 1 capsule (15 mg total) by mouth at bedtime as needed for sleep.  [name] torsemide (DEMADEX) 20 MG tablet Take 2 tablets (40 mg total) by mouth daily.  [name] triamcinolone ointment (KENALOG) 0.1 % Apply 1 application topically 2 (two) times daily as needed (dry/irritated skin.).  APPLY AS DIRECTED TO ITCHING SPOTS ON THE BODY DAILY TO TWICE DAILY AS NEEDED. NOT TO FACE.   [name] UNABLE TO FIND Overnight Pulse Oximetry (to evaluate the need for continued nocturnal oxygen)  DX I27.20  . [DISCONTINUED] CALCIUM-MAGNESIUM-VITAMIN D PO Take 1 tablet by mouth 2 (two) times daily.   No facility-administered encounter medications on file as of 03/25/2019.    ALLERGIES: Allergies  Allergen Reactions  . Peanut Oil Swelling  . Potassium Iodide Other (See Comments)    facial swelling, also eyes, and ears  . Ace Inhibitors Cough  . Doxycycline Hives  . Peanut-Containing Drug Products Nausea And Vomiting  . Amlodipine Rash  . Codeine Nausea And Vomiting  . Latex Hives and Rash  . Penicillins Hives, Itching and Rash    .Marland KitchenHas patient had a PCN reaction causing immediate rash, facial/tongue/throat swelling, SOB or lightheadedness with hypotension: No Has patient had a PCN reaction causing severe rash involving mucus membranes or skin necrosis: No Has patient had a PCN reaction that required hospitalization No Has patient had a PCN reaction occurring within the last 10 years: YES (3 years ago)  If all of the above answers are "NO", then may proceed with Cephalosporin use.     VACCINATION STATUS: Immunization History  Administered Date(s) Administered  . Influenza Split 06/22/2011, 05/13/2018  . Influenza Whole 07/11/2006  . Influenza,inj,Quad PF,6+ Mos 06/26/2013, 05/11/2014, 06/13/2015, 05/20/2017  . Influenza-Unspecified 06/26/2013, 05/11/2014, 06/13/2015, 06/06/2016,  05/20/2017  . Pneumococcal Conjugate-13 02/07/2015  . Pneumococcal Polysaccharide-23 06/26/2013  . Td 04/11/2004  . Tdap 05/09/2015    HPI   58 year old female patient with medical history as above. -She is being engaged in telehealth via telephone for follow-up of hypocalcemia and hypothyroidism.   -She is known to have hypocalcemia for at least 5 years however etiology not clear. She denies any parathyroid surgery nor any prior history of parathyroid dysfunction.  Her PTH has been as high as 161, responding appropriately to hypocalcemia.    She denies any family history of parathyroid, thyroid, adrenal, pituitary dysfunction. She was supposed to be on calcium supplement with at least 1000 mg of elemental calcium daily combined with magnesium and vitamin D. For unclear reasons, she did not keep up with her over-the-counter calcium supplements for more than 2 weeks prior to her last blood clot which showed hypocalcemia 6.5, dropping from 7.9.  She has missed her last appointment. -She had scleroderma, on various modalities of treatment.  She denies tingling, muscle cramps. .    -She has history of multinodular goiter with biopsy of one of her nodules consistent with hyperblastic nodule.  Repeat thyroid ultrasound on March 13, 2017 was remarkable for another left lobe nodule 1.5 cm with benign features.  Do not have subsequent follow-up ultrasound of the thyroid.  -She is on levothyroxine 75 mcg for hypothyroidism, reports compliance with medication -She denies dysphagia, shortness of breath, voice change. - She has a history of prolonged QT interval.    Review of Systems  .  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  Neurological: no tremors, no numbness, no tingling, no dizziness Psychiatric: no depression, no anxiety  Objective:    LMP 10/31/2002   Wt Readings from Last 3 Encounters:  03/12/19 198 lb 12.8 oz (90.2 kg)  03/10/19 199 lb (90.3 kg)  02/27/19 201  lb (91.2 kg)     CMP     Component Value Date/Time   NA 140 03/12/2019 1044   NA 142  09/30/2018 1409   K 3.5 03/12/2019 1044   CL 102 03/12/2019 1044   CO2 25 03/12/2019 1044   GLUCOSE 94 03/12/2019 1044   BUN 26 (H) 03/12/2019 1044   BUN 23 09/30/2018 1409   CREATININE 1.41 (H) 03/12/2019 1044   CREATININE 1.14 (H) 12/09/2018 1019   CALCIUM 6.5 (L) 03/12/2019 1044   CALCIUM 7.8 (L) 12/02/2008 2239   PROT 7.2 12/09/2018 1019   ALBUMIN 4.1 08/19/2018 1025   AST 15 12/09/2018 1019   ALT 9 12/09/2018 1019   ALKPHOS 51 08/19/2018 1025   BILITOT 0.4 12/09/2018 1019   GFRNONAA 42 (L) 03/12/2019 1044   GFRNONAA 62 05/09/2018 0837   GFRAA 48 (L) 03/12/2019 1044   GFRAA 71 05/09/2018 0837     Diabetic Labs (most recent): Lab Results  Component Value Date   HGBA1C 5.7 (H) 12/23/2017   HGBA1C 5.3 12/11/2016   HGBA1C 6.3 (H) 03/10/2016     Lipid Panel ( most recent) Lipid Panel     Component Value Date/Time   CHOL 166 12/23/2017 0738   TRIG 109 12/23/2017 0738   HDL 35 (L) 12/23/2017 0738   CHOLHDL 4.7 12/23/2017 0738   VLDL 18 12/11/2016 0743   LDLCALC 109 (H) 12/23/2017 0738     Assessment & Plan:   1. 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  Not due to hypoparathyroidism.  She has elevated PTH in response to hypocalcemia.  -Her most recent calcium was 6.5, patient is asymptomatic.  This is due to treatment withdrawal.   I urged patient with the fact that she needs daily supplement of calcium.  She is advised to obtain her supplements from the pharmacy or calcium- magnesium-vitamin D 500- 150-125 mg 2 tablets p.o. twice daily continue next measurement of calcium in 4 weeks.   2. hypothyroidism -Thyroid function tests in April 2020 were consistent with appropriate replacement.  She is advised to continue levothyroxine 75 mcg p.o. daily before breakfast.  - We discussed about the correct intake of her thyroid hormone, on  empty stomach at fasting, with water, separated by at least 30 minutes from breakfast and other medications,  and separated by more than 4 hours from calcium, iron, multivitamins, acid reflux medications (PPIs). -Patient is made aware of the fact that thyroid hormone replacement is needed for life, dose to be adjusted by periodic monitoring of thyroid function tests.  3.  Nodular goiter -Her thyroid ultrasound from August 2018 was remarkable for stable isthmus nodule at 1.8 cm and new 1.5 cm nodule on the left lobe with benign and non- suspicious features.  She missed her appointment for  surveillance thyroid/neck ultrasound. -She will not require intervention at this time.  4.  Type 2 diabetes-controlled with A1c of 5.7%.  Not on medications currently. -She will have repeat A1c on subsequent visits.    - I advised patient to maintain close follow up with Fayrene Helper, MD for primary care needs.   Time for this visit: 15 minutes. Kyerra Vargo Schlagel  participated in the discussions, expressed understanding, and voiced agreement with the above plans.  All questions were answered to her satisfaction. she is encouraged to contact clinic should she have any questions or concerns prior to her return visit.  Follow up plan: Return in about 5 weeks (around 04/29/2019) for Follow up with Pre-visit Labs.  Glade Lloyd, MD Phone: [phone]  Fax: [phone]   03/25/2019, 4:41 PM

## 2019-03-26 ENCOUNTER — Other Ambulatory Visit: Payer: Self-pay

## 2019-03-26 ENCOUNTER — Telehealth: Payer: Self-pay | Admitting: "Endocrinology

## 2019-03-26 MED ORDER — CALCIUM-MAGNESIUM-VITAMIN D 500-250-125 MG-MG-UNIT PO TABS: 2.0000 | ORAL_TABLET | Freq: Two times a day (BID) | ORAL | 3 refills | Status: DC

## 2019-03-26 NOTE — Telephone Encounter (Signed)
RX sent

## 2019-03-26 NOTE — Telephone Encounter (Signed)
When you get a chance if you havent already, can you call it in for her?

## 2019-03-26 NOTE — Telephone Encounter (Signed)
CAN YOU RESEND THIS? Calcium-Magnesium-Vitamin D [id number] MG-MG-UNIT TABS [[id number]]    PHARMACY DOES NOT HAVE IT. [facility]

## 2019-03-26 NOTE — Telephone Encounter (Signed)
Phoned in, faxed, & escribed.

## 2019-03-27 ENCOUNTER — Other Ambulatory Visit: Payer: Self-pay

## 2019-03-27 ENCOUNTER — Inpatient Hospital Stay (HOSPITAL_COMMUNITY)
Admission: EM | Admit: 2019-03-27 | Discharge: 2019-03-30 | DRG: 640 | Disposition: A | Discharge status: Home / Self Care | Payer: 59 | Attending: Internal Medicine | Admitting: Internal Medicine

## 2019-03-27 ENCOUNTER — Encounter (HOSPITAL_COMMUNITY): Payer: Self-pay | Admitting: Emergency Medicine

## 2019-03-27 DIAGNOSIS — E119 Type 2 diabetes mellitus without complications: Secondary | ICD-10-CM

## 2019-03-27 DIAGNOSIS — Z91040 Latex allergy status: Secondary | ICD-10-CM

## 2019-03-27 DIAGNOSIS — Z20828 Contact with and (suspected) exposure to other viral communicable diseases: Secondary | ICD-10-CM | POA: Diagnosis present

## 2019-03-27 DIAGNOSIS — Z885 Allergy status to narcotic agent status: Secondary | ICD-10-CM

## 2019-03-27 DIAGNOSIS — E876 Hypokalemia: Secondary | ICD-10-CM | POA: Diagnosis present

## 2019-03-27 DIAGNOSIS — Z833 Family history of diabetes mellitus: Secondary | ICD-10-CM

## 2019-03-27 DIAGNOSIS — J849 Interstitial pulmonary disease, unspecified: Secondary | ICD-10-CM | POA: Diagnosis present

## 2019-03-27 DIAGNOSIS — Z888 Allergy status to other drugs, medicaments and biological substances status: Secondary | ICD-10-CM

## 2019-03-27 DIAGNOSIS — E8881 Metabolic syndrome: Secondary | ICD-10-CM | POA: Diagnosis present

## 2019-03-27 DIAGNOSIS — I272 Pulmonary hypertension, unspecified: Secondary | ICD-10-CM | POA: Diagnosis present

## 2019-03-27 DIAGNOSIS — Z7982 Long term (current) use of aspirin: Secondary | ICD-10-CM

## 2019-03-27 DIAGNOSIS — Z90711 Acquired absence of uterus with remaining cervical stump: Secondary | ICD-10-CM

## 2019-03-27 DIAGNOSIS — I5033 Acute on chronic diastolic (congestive) heart failure: Secondary | ICD-10-CM | POA: Diagnosis present

## 2019-03-27 DIAGNOSIS — I10 Essential (primary) hypertension: Secondary | ICD-10-CM | POA: Diagnosis present

## 2019-03-27 DIAGNOSIS — Z880 Allergy status to penicillin: Secondary | ICD-10-CM

## 2019-03-27 DIAGNOSIS — I110 Hypertensive heart disease with heart failure: Secondary | ICD-10-CM | POA: Diagnosis present

## 2019-03-27 DIAGNOSIS — D126 Benign neoplasm of colon, unspecified: Secondary | ICD-10-CM | POA: Diagnosis present

## 2019-03-27 DIAGNOSIS — D509 Iron deficiency anemia, unspecified: Secondary | ICD-10-CM | POA: Diagnosis present

## 2019-03-27 DIAGNOSIS — Z79899 Other long term (current) drug therapy: Secondary | ICD-10-CM

## 2019-03-27 DIAGNOSIS — Z91010 Allergy to peanuts: Secondary | ICD-10-CM

## 2019-03-27 DIAGNOSIS — M349 Systemic sclerosis, unspecified: Secondary | ICD-10-CM | POA: Diagnosis present

## 2019-03-27 DIAGNOSIS — I5032 Chronic diastolic (congestive) heart failure: Secondary | ICD-10-CM | POA: Diagnosis present

## 2019-03-27 DIAGNOSIS — Z79890 Hormone replacement therapy (postmenopausal): Secondary | ICD-10-CM

## 2019-03-27 DIAGNOSIS — Z09 Encounter for follow-up examination after completed treatment for conditions other than malignant neoplasm: Secondary | ICD-10-CM

## 2019-03-27 DIAGNOSIS — I4581 Long QT syndrome: Secondary | ICD-10-CM | POA: Diagnosis present

## 2019-03-27 DIAGNOSIS — E039 Hypothyroidism, unspecified: Secondary | ICD-10-CM | POA: Diagnosis present

## 2019-03-27 DIAGNOSIS — Z8249 Family history of ischemic heart disease and other diseases of the circulatory system: Secondary | ICD-10-CM

## 2019-03-27 DIAGNOSIS — R9431 Abnormal electrocardiogram [ECG] [EKG]: Secondary | ICD-10-CM | POA: Diagnosis present

## 2019-03-27 LAB — COMPREHENSIVE METABOLIC PANEL WITH GFR
ALT: 24 U/L (ref 0–44)
AST: 35 U/L (ref 15–41)
Albumin: 3.5 g/dL (ref 3.5–5.0)
Alkaline Phosphatase: 73 U/L (ref 38–126)
Anion gap: 14 (ref 5–15)
BUN: 34 mg/dL — ABNORMAL HIGH (ref 6–20)
CO2: 28 mmol/L (ref 22–32)
Calcium: 5.6 mg/dL — CL (ref 8.9–10.3)
Chloride: 101 mmol/L (ref 98–111)
Creatinine, Ser: 1.42 mg/dL — ABNORMAL HIGH (ref 0.44–1.00)
GFR calc Af Amer: 48 mL/min — ABNORMAL LOW
GFR calc non Af Amer: 41 mL/min — ABNORMAL LOW
Glucose, Bld: 92 mg/dL (ref 70–99)
Potassium: 2.8 mmol/L — ABNORMAL LOW (ref 3.5–5.1)
Sodium: 143 mmol/L (ref 135–145)
Total Bilirubin: 0.3 mg/dL (ref 0.3–1.2)
Total Protein: 7.7 g/dL (ref 6.5–8.1)

## 2019-03-27 LAB — PHOSPHORUS: Phosphorus: 7.1 mg/dL — ABNORMAL HIGH (ref 2.5–4.6)

## 2019-03-27 LAB — CBC
HCT: 34.7 % — ABNORMAL LOW (ref 36.0–46.0)
Hemoglobin: 10.6 g/dL — ABNORMAL LOW (ref 12.0–15.0)
MCH: 25.2 pg — ABNORMAL LOW (ref 26.0–34.0)
MCHC: 30.5 g/dL (ref 30.0–36.0)
MCV: 82.6 fL (ref 80.0–100.0)
Platelets: 396 10*3/uL (ref 150–400)
RBC: 4.2 MIL/uL (ref 3.87–5.11)
RDW: 16.4 % — ABNORMAL HIGH (ref 11.5–15.5)
WBC: 11.4 10*3/uL — ABNORMAL HIGH (ref 4.0–10.5)
nRBC: 0 % (ref 0.0–0.2)

## 2019-03-27 LAB — MAGNESIUM: Magnesium: 2.4 mg/dL (ref 1.7–2.4)

## 2019-03-27 LAB — CK: Total CK: 453 U/L — ABNORMAL HIGH (ref 38–234)

## 2019-03-27 MED ORDER — CALCIUM GLUCONATE-NACL 1-0.675 GM/50ML-% IV SOLN
1.0000 g | Freq: Once | INTRAVENOUS | Status: AC
  Administered 2019-03-28: 1000 mg via INTRAVENOUS
  Filled 2019-03-27: qty 50

## 2019-03-27 MED ORDER — POTASSIUM CHLORIDE 10 MEQ/100ML IV SOLN
10.0000 meq | INTRAVENOUS | Status: AC
  Administered 2019-03-28 (×2): 10 meq via INTRAVENOUS
  Filled 2019-03-27 (×3): qty 100

## 2019-03-27 MED ORDER — SODIUM CHLORIDE 0.9 % IV SOLN: 1.0000 g | Freq: Once | INTRAVENOUS | Status: DC

## 2019-03-27 NOTE — ED Notes (Signed)
I attempted to collect labs and was unsuccessful. 

## 2019-03-27 NOTE — ED Provider Notes (Signed)
[facility] DEPT Provider Note   CSN: [id number] Arrival date & time: 03/27/19  1623    History   Chief Complaint Chief Complaint  Patient presents with  . Abnormal Lab    low calcium    HPI Sabrina [name] is a 58 y.o. female with a history of scleroderma, chronic diastolic heart failure, pulmonary hypertension, prolonged QT syndrome, diabetes mellitus who presents to the emergency department with a chief complaint of abnormal lab result.  Patient reports that she was advised to come to the ER by her rheumatologist at [facility] after she was found to have low calcium and was advised to immediately come to the ER.  She reports worsening intermittent muscle spasms and cramping in her bilateral lower extremities over the last 2 weeks.  No known aggravating or alleviating factors.  She also notes that she has been having cramping sensations to her face for the last few days.    She also endorses worsening dyspnea on exertion and bilateral lower extremity edema over the last 2 weeks.  She has been compliant with her home Lasix.  She reports that she has been feeling very weak and fatigued.  She is also intermittently noticed bladder spasms.  She denies chest pain, fever, chills, nausea, cough, vomiting, abdominal pain, seizures, visual changes, confusion, hallucinations, or mood changes.  Per chart review, patient was seen by Dr. Jodelle Green, endocrinology.  On note from 05/14/18, hypocalcemia was of undetermined etiology.     The history is provided by the patient. No language interpreter was used.    Past Medical History:  Diagnosis Date  . Allergy   . Anemia   . Arthritis   . Asthma   . Chronic bronchitis ([place])   . Connective tissue disease ([place])   . Diabetes mellitus   . Endometriosis   . Fibroid uterus   . GERD (gastroesophageal reflux disease)   . History of Doppler ultrasound    a. Carotid US 8/08:Estimated stenosis in the right and left  internal carotid arteries is 0-50% and 0-50%.  . History of echocardiogram    a. Echo 6/16: EF 60-65%, no RWMA.  . History of hiatal hernia   . History of nuclear stress test    a. Myoview 6/16: There is no resting or stress perfusion defect consistent with no prior infarct and no ischemia. EF 73 %  The patient was hypertensive prior and during the study. Cardiac cath 04/2016 showed normal coronary arteries  . Hx of iron deficiency anemia   . Hypersomnia with sleep apnea   . Hypertension   . Lupus ([place]) 07/2013   Of Skin  . Menorrhagia   . Obesity   . Pneumonia   . 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 Secondary hyperthyroidism   . Thyroid disease   . Tinnitus    left ear  . Tubular adenoma of colon   . Vertigo   . Vitamin D deficiency     Patient Active Problem List   Diagnosis Date Noted  . PAH (pulmonary artery hypertension) ([place]) 12/03/2018  . Hypoxia 08/19/2018  . Ischemia of digits of hand 05/17/2018  . Multinodular goiter 05/14/2018  . Diabetes mellitus without complication ([place]) 43/32/9518  . Foreign body in finger-infected 03/19/2018  . Upper airway cough syndrome 02/04/2018  . ILD (interstitial lung disease) ([place]) 11/21/2017  . Insomnia 05/20/2017  . Morbid obesity ([place]) 05/20/2017  . Elevated TSH 12/16/2016  . Hypocalcemia 07/07/2016  . Hypokalemia 07/07/2016  . Prolonged QT  syndrome 06/22/2016  . 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 Pulmonary hypertension ([place]) 04/06/2016  . Chronic diastolic heart failure ([place]) 04/06/2016  . Precordial pain   . Drug eruption 03/14/2016  . Psoriasis 10/18/2015  . GERD (gastroesophageal reflux disease) 07/05/2015  . 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 Headache 05/10/2015  . Scleroderma ([place]) 03/09/2015  . Metabolic syndrome X 93/81/8299  . Encounter for long-term (current) use of other medications 01/14/2015  . Seasonal allergies 01/05/2015  . Iron deficiency anemia 09/11/2014  .  Tubular adenoma of colon 09/11/2014  . Neoplasm of uncertain behavior of skin 05/09/2012  . Chronic fatigue 10/26/2010  . Secondary hyperparathyroidism ([place]) 12/02/2008  . Vitamin D deficiency 10/10/2008  . Hypothyroidism 08/27/2007  . Essential hypertension 08/27/2007    Past Surgical History:  Procedure Laterality Date  . ABDOMINAL HYSTERECTOMY  2004   supracervical hysterectomy  . BREAST SURGERY  2005   reduction  . BREATH TEK H PYLORI [name] 12/24/2014   Procedure: BREATH TEK H PYLORI;  Surgeon: Greer Pickerel, MD;  Location: [facility];  Service: General;  Laterality: [name];  . CARDIAC CATHETERIZATION [name] 03/22/2016   Procedure: Right/Left Heart Cath and Coronary Angiography;  Surgeon: Troy Sine, MD;  Location: [facility];  Service: Cardiovascular;  Laterality: [name];  . DILATION AND CURETTAGE OF UTERUS     age 41  . ESOPHAGOGASTRODUODENOSCOPY (EGD) WITH PROPOFOL [name] 10/14/2015   Procedure: ESOPHAGOGASTRODUODENOSCOPY (EGD) WITH PROPOFOL;  Surgeon: Clarene Essex, MD;  Location: [facility];  Service: Endoscopy;  Laterality: [name];  . LAPAROSCOPIC GASTRIC SLEEVE RESECTION WITH HIATAL HERNIA REPAIR [name] 07/05/2015   Procedure: LAPAROSCOPIC GASTRIC SLEEVE RESECTION WITH HIATAL HERNIA REPAIR, upper endoscopy;  Surgeon: Greer Pickerel, MD;  Location: [facility];  Service: General;  Laterality: [name];  . REDUCTION MAMMAPLASTY Bilateral   . RIGHT HEART CATH [name] 10/13/2018   Procedure: RIGHT HEART CATH;  Surgeon: Larey Dresser, MD;  Location: [facility];  Service: Cardiovascular;  Laterality: [name];     OB History    Gravida  0   Para  0   Term  0   Preterm  0   AB  0   Living  0     SAB  0   TAB  0   Ectopic  0   Multiple  0   Live Births               Home Medications    Prior to Admission medications   Medication Sig Start Date End Date Taking? Authorizing Provider  acetaminophen (TYLENOL) 500 MG tablet Take 500-1,000 mg by mouth every 6 (six) hours as needed  for headache.    Yes [provider]  ambrisentan (LETAIRIS) 5 MG tablet Take 1 tablet (5 mg total) by mouth daily. 12/08/18  Yes Larey Dresser, MD  aspirin EC 81 MG tablet Take 1 tablet (81 mg total) by mouth daily. 03/20/16  Yes Weaver, Scott T, PA-C  bisoprolol (ZEBETA) 5 MG tablet Take 0.5 tablets (2.5 mg total) by mouth daily. 03/12/19  Yes Larey Dresser, MD  Calcium-Magnesium-Vitamin D [phone] MG-MG-UNIT TABS Take 2 tablets by mouth 2 (two) times daily. 03/26/19  Yes Nida, Marella Chimes, MD  levothyroxine (SYNTHROID) 75 MCG tablet TAKE 1 TABLET (75 MCG TOTAL) BY MOUTH DAILY BEFORE BREAKFAST. 03/03/19  Yes Nida, Marella Chimes, MD  Multiple Vitamin (MULTIVITAMIN WITH MINERALS) TABS tablet Take 1 tablet by mouth daily.   Yes [provider]  mycophenolate (MYFORTIC) 360 MG TBEC EC tablet Take 720 mg by mouth 2 (two) times daily.  04/04/18  Yes [provider]  pantoprazole (PROTONIX) 20 MG tablet Take 20 mg by mouth See admin instructions. Take 1 tablet (20 mg) by mouth scheduled each morning, may take an additional dose in the evening if needed for heartburn/indigestion. 10/01/18  Yes [provider]  potassium chloride (K-DUR) 10 MEQ tablet Take 2 tablets by mouth twice daily 12/01/18  Yes Fayrene Helper, MD  Selexipag (UPTRAVI) 1200 MCG TABS Take 1,200 mcg by mouth every 12 (twelve) hours. 01/22/19  Yes Larey Dresser, MD  temazepam (RESTORIL) 15 MG capsule Take 1 capsule (15 mg total) by mouth at bedtime as needed for sleep. 03/10/19  Yes Fayrene Helper, MD  torsemide (DEMADEX) 20 MG tablet Take 2 tablets (40 mg total) by mouth daily. 03/12/19 03/06/20 Yes Larey Dresser, MD  triamcinolone ointment (KENALOG) 0.1 % Apply 1 application topically 2 (two) times daily as needed (dry/irritated skin.). APPLY AS DIRECTED TO ITCHING SPOTS ON THE BODY DAILY TO TWICE DAILY AS NEEDED. NOT TO FACE.    Yes [provider]  nitroGLYCERIN (NITROSTAT)  0.4 MG SL tablet Place 0.4 mg under the tongue every 5 (five) minutes x 3 doses as needed for chest pain.     [provider]  UNABLE TO FIND Overnight Pulse Oximetry (to evaluate the need for continued nocturnal oxygen)  DX I27.20 09/17/18   Fayrene Helper, MD    Family History Family History  Problem Relation Age of Onset  . Hypertension Mother   . Diabetes Mother   . Arthritis Mother   . Depression Mother   . CAD Mother        s/p PCI  . Diabetes Father   . Stroke Father   . Arthritis Father   . Depression Father   . Hypertension Father   . Cancer Father        prostate  . Hypertension Sister        x 2  . Breast cancer Sister   . Arthritis Sister   . Bleeding Disorder Sister   . Depression Sister   . Thyroid disease Sister   . Colon cancer Maternal Grandfather   . Stroke Maternal Aunt   . Heart attack Neg Hx     Social History Social History   Tobacco Use  . Smoking status: Never Smoker  . Smokeless tobacco: Never Used  Substance Use Topics  . Alcohol use: No    Alcohol/week: 0.0 standard drinks  . Drug use: No     Allergies   Peanut oil, Potassium iodide, Ace inhibitors, Doxycycline, Peanut-containing drug products, Amlodipine, Codeine, Latex, and Penicillins   Review of Systems Review of Systems  Constitutional: Positive for fatigue. Negative for activity change, chills and fever.  Respiratory: Positive for shortness of breath. Negative for cough and wheezing.   Cardiovascular: Positive for leg swelling. Negative for chest pain.  Gastrointestinal: Negative for abdominal pain, diarrhea, nausea and vomiting.  Genitourinary: Negative for dysuria, flank pain, frequency, urgency and vaginal bleeding.  Musculoskeletal: Negative for back pain, gait problem, joint swelling, neck pain and neck stiffness.       Muscle cramping and spasms  Skin: Negative for rash.  Allergic/Immunologic: Negative for immunocompromised state.  Neurological: Negative  for dizziness, seizures, weakness and headaches.  Psychiatric/Behavioral: Negative for confusion.     Physical Exam Updated Vital Signs BP (!) 82/71   Pulse 68  Temp 98.3 F (36.8 C) (Oral)   Resp (!) 25   Wt 82 kg   LMP 10/31/2002   SpO2 94%   BMI 36.52 kg/m   Physical Exam Vitals signs and nursing note reviewed.  Constitutional:      General: She is not in acute distress.    Appearance: She is not diaphoretic.  HENT:     Head: Normocephalic.     Comments: Negative [place]ek sign.     Mouth/Throat:     Mouth: Mucous membranes are moist.     Pharynx: No posterior oropharyngeal erythema.  Eyes:     Extraocular Movements: Extraocular movements intact.     Conjunctiva/sclera: Conjunctivae normal.     Pupils: Pupils are equal, round, and reactive to light.  Neck:     Musculoskeletal: Neck supple.  Cardiovascular:     Rate and Rhythm: Normal rate and regular rhythm.     Heart sounds: Murmur present. No friction rub. No gallop.   Pulmonary:     Effort: Pulmonary effort is normal. No respiratory distress.     Breath sounds: No stridor. No wheezing, rhonchi or rales.  Chest:     Chest wall: No tenderness.  Abdominal:     General: Abdomen is flat. There is no distension.     Palpations: Abdomen is soft. There is no mass.     Tenderness: There is no abdominal tenderness. There is no right CVA tenderness, left CVA tenderness, guarding or rebound.     Hernia: No hernia is present.  Musculoskeletal:     Right lower leg: Edema present.     Left lower leg: Edema present.  Skin:    General: Skin is warm.     Capillary Refill: Capillary refill takes less than 2 seconds.     Coloration: Skin is not jaundiced.     Findings: No rash.  Neurological:     General: No focal deficit present.     Mental Status: She is alert.  Psychiatric:        Behavior: Behavior normal.      ED Treatments / Results  Labs (all labs ordered are listed, but only abnormal results are displayed)  Labs Reviewed  CBC - Abnormal; Notable for the following components:      Result Value   WBC 11.4 (*)    Hemoglobin 10.6 (*)    HCT 34.7 (*)    MCH 25.2 (*)    RDW 16.4 (*)    All other components within normal limits  COMPREHENSIVE METABOLIC PANEL - Abnormal; Notable for the following components:   Potassium 2.8 (*)    BUN 34 (*)    Creatinine, Ser 1.42 (*)    Calcium 5.6 (*)    GFR calc non Af Amer 41 (*)    GFR calc Af Amer 48 (*)    All other components within normal limits  PHOSPHORUS - Abnormal; Notable for the following components:   Phosphorus 7.1 (*)    All other components within normal limits  CK - Abnormal; Notable for the following components:   Total CK 453 (*)    All other components within normal limits  BRAIN NATRIURETIC PEPTIDE - Abnormal; Notable for the following components:   B Natriuretic Peptide 484.2 (*)    All other components within normal limits  SARS CORONAVIRUS 2  MAGNESIUM  CALCIUM, IONIZED  PARATHYROID HORMONE, INTACT (NO CA)  HIV ANTIBODY (ROUTINE TESTING W REFLEX)  BASIC METABOLIC PANEL  CBC  CBG MONITORING, ED  EKG EKG Interpretation  Date/Time:  Friday March 27 2019 23:28:11 EDT Ventricular Rate:  69 PR Interval:    QRS Duration: 83 QT Interval:  502 QTC Calculation: 538 R Axis:   116 Text Interpretation:  Sinus rhythm RVH with secondary repolarization abnrm Nonspecific T abnormalities, lateral leads Prolonged QT interval ST depression V1-V3, suggest recording posterior leads Baseline wander in lead(s) V6 Confirmed by Ripley Fraise [id number]) on 03/27/2019 11:42:02 PM   Radiology Dg Chest 2 View  Result Date: 03/28/2019 CLINICAL DATA:  Cough and shortness of breath. EXAM: CHEST - 2 VIEW COMPARISON:  Radiograph 08/19/2018 FINDINGS: The cardiomediastinal contours are unchanged with mild cardiomegaly. Mild vascular congestion without pulmonary edema. Streaky bibasilar atelectasis or scarring. No consolidation, pleural effusion, or  pneumothorax. No acute osseous abnormalities are seen. IMPRESSION: 1. Mild cardiomegaly with vascular congestion. 2. Streaky bibasilar atelectasis or scarring. Electronically Signed   By: Keith Rake M.D.   On: 03/28/2019 01:21    Procedures .Critical Care Performed by: Joanne Gavel, PA-C Authorized by: Joanne Gavel, PA-C   Critical care provider statement:    Critical care time (minutes):  40   Critical care time was exclusive of:  Separately billable procedures and treating other patients and teaching time   Critical care was necessary to treat or prevent imminent or life-threatening deterioration of the following conditions:  Metabolic crisis   Critical care was time spent personally by me on the following activities:  Ordering and review of radiographic studies, ordering and review of laboratory studies, re-evaluation of patient's condition, review of old charts, obtaining history from patient or surrogate, examination of patient, evaluation of patient's response to treatment, development of treatment plan with patient or surrogate, ordering and performing treatments and interventions and pulse oximetry   (including critical care time)  Medications Ordered in ED Medications  aspirin EC tablet 81 mg (has no administration in time range)  ambrisentan (LETAIRIS) tablet 5 mg (has no administration in time range)  bisoprolol (ZEBETA) tablet 2.5 mg (has no administration in time range)  nitroGLYCERIN (NITROSTAT) SL tablet 0.4 mg (has no administration in time range)  Selexipag TABS 1,200 mcg (has no administration in time range)  temazepam (RESTORIL) capsule 15 mg (has no administration in time range)  levothyroxine (SYNTHROID) tablet 75 mcg (has no administration in time range)  mycophenolate (MYFORTIC) EC tablet 720 mg (has no administration in time range)  Calcium-Magnesium-Vitamin D 500-250-125 MG-MG-UNIT TABS 2 tablet (has no administration in time range)  multivitamin with  minerals tablet 1 tablet (has no administration in time range)  acetaminophen (TYLENOL) tablet 650 mg (has no administration in time range)    Or  acetaminophen (TYLENOL) suppository 650 mg (has no administration in time range)  calcium gluconate 1 g/ 50 mL sodium chloride IVPB (0 mg Intravenous Stopped 03/28/19 0113)  potassium chloride 10 mEq in 100 mL IVPB (0 mEq Intravenous Stopped 03/28/19 0214)  calcium carbonate (TUMS - dosed in mg elemental calcium) chewable tablet 400 mg of elemental calcium (400 mg of elemental calcium Oral Given 03/28/19 0110)  potassium chloride SA (K-DUR) CR tablet 60 mEq (60 mEq Oral Given 03/28/19 0212)     Initial Impression / Assessment and Plan / ED Course  I have reviewed the triage vital signs and the nursing notes.  Pertinent labs & imaging results that were available during my care of the patient were reviewed by me and considered in my medical decision making (see chart for details).  58 year old female with a history of scleroderma, chronic diastolic heart failure, pulmonary hypertension, prolonged QT syndrome, diabetes mellitus who presents to the emergency department after she was found to have HYPOcalcemia on labs performed by her PCP.  She is endorsing muscle spasms and cramping, facial twitching, dyspnea on exertion, and peripheral edema.  No chest pain.  No constitutional symptoms.  The patient was discussed with Dr. Christy Gentles, attending physician.   Serum calcium is 5.6; corrected CA is 6.0.  Potassium is 2.8.  Magnesium level is normal at 2.4.  EKG with prolonged QTC of 538.   IV calcium gluconate, oral calcium carbonate, and IV potassium chloride were initiated in the ER.  Creatinine is 1.42, unchanged from previous.  CXR and BNP are pending since she is endorsing shortness of breath and has a history of congestive heart failure.  As she has been having muscle cramping, CK is also pending.   Shared decision making conversation with the  patient and her husband regarding admission at [facility] since several of her specialists are affiliated with [facility].  She would prefer to remain at [facility].  Consult to the hospitalist team and spoke with Dr. Hal Hope who will accept the patient for admission.  The patient appears reasonably stabilized for admission considering the current resources, flow, and capabilities available in the ED at this time, and I doubt any other [facility] requiring further screening and/or treatment in the ED prior to admission.  Final Clinical Impressions(s) / ED Diagnoses   Final diagnoses:  Hypocalcemia  Hypokalemia    ED Discharge Orders    None       Joanne Gavel, PA-C 03/28/19 0432    Ripley Fraise, MD 03/28/19 [id number]

## 2019-03-27 NOTE — ED Triage Notes (Signed)
Pt reports that she had an appt with [facility] today and was called and told to go to ED due to Calcium being low.

## 2019-03-28 ENCOUNTER — Other Ambulatory Visit: Payer: Self-pay

## 2019-03-28 ENCOUNTER — Emergency Department (HOSPITAL_COMMUNITY): Payer: 59

## 2019-03-28 ENCOUNTER — Encounter (HOSPITAL_COMMUNITY): Payer: Self-pay | Admitting: Internal Medicine

## 2019-03-28 DIAGNOSIS — I5032 Chronic diastolic (congestive) heart failure: Secondary | ICD-10-CM

## 2019-03-28 DIAGNOSIS — E876 Hypokalemia: Secondary | ICD-10-CM

## 2019-03-28 LAB — BASIC METABOLIC PANEL
Anion gap: 11 (ref 5–15)
Anion gap: 13 (ref 5–15)
BUN: 34 mg/dL — ABNORMAL HIGH (ref 6–20)
BUN: 32 mg/dL — ABNORMAL HIGH (ref 6–20)
CO2: 28 mmol/L (ref 22–32)
CO2: 24 mmol/L (ref 22–32)
Calcium: 5.8 mg/dL — CL (ref 8.9–10.3)
Calcium: 6.1 mg/dL — CL (ref 8.9–10.3)
Chloride: 105 mmol/L (ref 98–111)
Chloride: 105 mmol/L (ref 98–111)
Creatinine, Ser: 1.42 mg/dL — ABNORMAL HIGH (ref 0.44–1.00)
Creatinine, Ser: 1.38 mg/dL — ABNORMAL HIGH (ref 0.44–1.00)
GFR calc Af Amer: 48 mL/min — ABNORMAL LOW (ref >60)
GFR calc Af Amer: 50 mL/min — ABNORMAL LOW (ref >60)
GFR calc non Af Amer: 41 mL/min — ABNORMAL LOW (ref >60)
GFR calc non Af Amer: 43 mL/min — ABNORMAL LOW (ref >60)
Glucose, Bld: 97 mg/dL (ref 70–99)
Glucose, Bld: 118 mg/dL — ABNORMAL HIGH (ref 70–99)
Potassium: 3.4 mmol/L — ABNORMAL LOW (ref 3.5–5.1)
Potassium: 3.8 mmol/L (ref 3.5–5.1)
Sodium: 144 mmol/L (ref 135–145)
Sodium: 142 mmol/L (ref 135–145)

## 2019-03-28 LAB — CBC
HCT: 32 % — ABNORMAL LOW (ref 36.0–46.0)
Hemoglobin: 9.5 g/dL — ABNORMAL LOW (ref 12.0–15.0)
MCH: 24.5 pg — ABNORMAL LOW (ref 26.0–34.0)
MCHC: 29.7 g/dL — ABNORMAL LOW (ref 30.0–36.0)
MCV: 82.7 fL (ref 80.0–100.0)
Platelets: 377 10*3/uL (ref 150–400)
RBC: 3.87 MIL/uL (ref 3.87–5.11)
RDW: 16.6 % — ABNORMAL HIGH (ref 11.5–15.5)
WBC: 9.5 10*3/uL (ref 4.0–10.5)
nRBC: 0 % (ref 0.0–0.2)

## 2019-03-28 LAB — HIV ANTIBODY (ROUTINE TESTING W REFLEX): HIV Screen 4th Generation wRfx: NONREACTIVE

## 2019-03-28 LAB — BRAIN NATRIURETIC PEPTIDE: B Natriuretic Peptide: 484.2 pg/mL — ABNORMAL HIGH (ref 0.0–100.0)

## 2019-03-28 LAB — SARS CORONAVIRUS 2 (TAT 6-24 HRS): SARS Coronavirus 2: NEGATIVE

## 2019-03-28 LAB — CBG MONITORING, ED: Glucose-Capillary: 82 mg/dL (ref 70–99)

## 2019-03-28 MED ORDER — TEMAZEPAM 15 MG PO CAPS: 15.0000 mg | ORAL_CAPSULE | Freq: Every evening | ORAL | Status: DC | PRN

## 2019-03-28 MED ORDER — HEPARIN SODIUM (PORCINE) 5000 UNIT/ML IJ SOLN
5000.0000 [IU] | Freq: Three times a day (TID) | INTRAMUSCULAR | Status: DC
  Administered 2019-03-28 – 2019-03-30 (×6): 5000 [IU] via SUBCUTANEOUS
  Filled 2019-03-28 (×6): qty 1

## 2019-03-28 MED ORDER — ENOXAPARIN SODIUM 40 MG/0.4ML ~~LOC~~ SOLN: 40.0000 mg | SUBCUTANEOUS | Status: DC

## 2019-03-28 MED ORDER — CALCIUM-MAGNESIUM-VITAMIN D 500-250-125 MG-MG-UNIT PO TABS: 2.0000 | ORAL_TABLET | Freq: Two times a day (BID) | ORAL | Status: DC

## 2019-03-28 MED ORDER — ACETAMINOPHEN 325 MG PO TABS: 650.0000 mg | ORAL_TABLET | Freq: Four times a day (QID) | ORAL | Status: DC | PRN

## 2019-03-28 MED ORDER — AMBRISENTAN 5 MG PO TABS
5.0000 mg | ORAL_TABLET | Freq: Every day | ORAL | Status: DC
  Filled 2019-03-28: qty 1

## 2019-03-28 MED ORDER — CALCIUM CARBONATE-VITAMIN D 500-200 MG-UNIT PO TABS
2.0000 | ORAL_TABLET | Freq: Two times a day (BID) | ORAL | Status: DC
  Administered 2019-03-28 – 2019-03-30 (×5): 2 via ORAL
  Filled 2019-03-28 (×6): qty 2

## 2019-03-28 MED ORDER — LEVOTHYROXINE SODIUM 75 MCG PO TABS
75.0000 ug | ORAL_TABLET | Freq: Every day | ORAL | Status: DC
  Administered 2019-03-28 – 2019-03-30 (×3): 75 ug via ORAL
  Filled 2019-03-28 (×3): qty 1

## 2019-03-28 MED ORDER — MYCOPHENOLATE SODIUM 180 MG PO TBEC
720.0000 mg | DELAYED_RELEASE_TABLET | Freq: Two times a day (BID) | ORAL | Status: DC
  Administered 2019-03-28 – 2019-03-30 (×4): 720 mg via ORAL
  Filled 2019-03-28 (×6): qty 4

## 2019-03-28 MED ORDER — BISOPROLOL FUMARATE 5 MG PO TABS
2.5000 mg | ORAL_TABLET | Freq: Every day | ORAL | Status: DC
  Administered 2019-03-28 – 2019-03-30 (×3): 2.5 mg via ORAL
  Filled 2019-03-28: qty 1
  Filled 2019-03-28: qty 0.5
  Filled 2019-03-28: qty 1

## 2019-03-28 MED ORDER — AMBRISENTAN 5 MG PO TABS
5.0000 mg | ORAL_TABLET | Freq: Every day | ORAL | Status: DC
  Administered 2019-03-29 – 2019-03-30 (×2): 5 mg via ORAL
  Filled 2019-03-28: qty 1

## 2019-03-28 MED ORDER — MAGNESIUM OXIDE 400 (241.3 MG) MG PO TABS
400.0000 mg | ORAL_TABLET | Freq: Two times a day (BID) | ORAL | Status: DC
  Administered 2019-03-28 – 2019-03-30 (×5): 400 mg via ORAL
  Filled 2019-03-28 (×5): qty 1

## 2019-03-28 MED ORDER — CALCIUM CARBONATE ANTACID 500 MG PO CHEW
400.0000 mg | CHEWABLE_TABLET | Freq: Once | ORAL | Status: AC
  Administered 2019-03-28: 400 mg via ORAL
  Filled 2019-03-28: qty 2

## 2019-03-28 MED ORDER — ACETAMINOPHEN 650 MG RE SUPP: 650.0000 mg | Freq: Four times a day (QID) | RECTAL | Status: DC | PRN

## 2019-03-28 MED ORDER — POTASSIUM CHLORIDE CRYS ER 20 MEQ PO TBCR
60.0000 meq | EXTENDED_RELEASE_TABLET | Freq: Once | ORAL | Status: AC
  Administered 2019-03-28: 60 meq via ORAL
  Filled 2019-03-28: qty 3

## 2019-03-28 MED ORDER — SELEXIPAG 1200 MCG PO TABS
1200.0000 ug | ORAL_TABLET | Freq: Two times a day (BID) | ORAL | Status: DC
  Administered 2019-03-29 – 2019-03-30 (×2): 1200 ug via ORAL

## 2019-03-28 MED ORDER — POTASSIUM CHLORIDE CRYS ER 20 MEQ PO TBCR
40.0000 meq | EXTENDED_RELEASE_TABLET | Freq: Once | ORAL | Status: AC
  Administered 2019-03-28: 40 meq via ORAL
  Filled 2019-03-28: qty 2

## 2019-03-28 MED ORDER — ADULT MULTIVITAMIN W/MINERALS CH
1.0000 | ORAL_TABLET | Freq: Every day | ORAL | Status: DC
  Administered 2019-03-28 – 2019-03-29 (×2): 1 via ORAL
  Filled 2019-03-28 (×3): qty 1

## 2019-03-28 MED ORDER — NITROGLYCERIN 0.4 MG SL SUBL: 0.4000 mg | SUBLINGUAL_TABLET | SUBLINGUAL | Status: DC | PRN

## 2019-03-28 MED ORDER — CALCIUM GLUCONATE-NACL 1-0.675 GM/50ML-% IV SOLN
1.0000 g | Freq: Once | INTRAVENOUS | Status: AC
  Administered 2019-03-28: 1000 mg via INTRAVENOUS
  Filled 2019-03-28: qty 50

## 2019-03-28 MED ORDER — ASPIRIN EC 81 MG PO TBEC
81.0000 mg | DELAYED_RELEASE_TABLET | Freq: Every day | ORAL | Status: DC
  Administered 2019-03-28 – 2019-03-30 (×3): 81 mg via ORAL
  Filled 2019-03-28 (×3): qty 1

## 2019-03-28 MED ORDER — CALCIUM GLUCONATE-NACL 2-0.675 GM/100ML-% IV SOLN
2.0000 g | Freq: Once | INTRAVENOUS | Status: AC
  Administered 2019-03-28: 2000 mg via INTRAVENOUS
  Filled 2019-03-28: qty 100

## 2019-03-28 NOTE — ED Notes (Signed)
Patient ambulated to RR without assistance. Upon returning to the bed, patient stated that she felt SOB, oxygen saturations 95-100%, increased RR rate. Patient states that she feels like her legs are cramping, explained this is a normal SE of electrolyte imbalances.

## 2019-03-28 NOTE — ED Notes (Signed)
Family member now sitting at bedside with patient. Patient has been unhooked from the monitor as patient requested she wanted to take a bath. Plan of care reviewed with patient. No identified needs at this time. Patient curious as to what time she will be going upstairs, informed patient that this RN would let her know details once made available. Status has not changed at this time. Patient told Tre'von, NT that she "would leave, if she doesn't get a bed soon."

## 2019-03-28 NOTE — ED Notes (Signed)
Breakfast tray provided for patient

## 2019-03-28 NOTE — Progress Notes (Signed)
CRITICAL VALUE ALERT  Critical Value:  Calcium 6.1  Date & Time Notied:  2013  Provider Notified: Loletha Grayer Bodenheimer  Orders Received/Actions taken: Awaiting any new orders

## 2019-03-28 NOTE — ED Notes (Signed)
Arrived back from CT via stretcher.

## 2019-03-28 NOTE — ED Notes (Signed)
ED TO INPATIENT HANDOFF REPORT  ED Nurse Name and Phone #: [phone]  S Name/Age/Gender Sabrina [name] 58 y.o. female Room/Bed: WA11/WA11  Code Status   Code Status: Full Code  Home/SNF/Other Home Patient oriented to: self, place, time and situation Is this baseline? Yes   Triage Complete: Triage complete  Chief Complaint Abnormal Labs   Triage Note Pt reports that she had an appt with [facility] today and was called and told to go to ED due to Calcium being low.    Allergies Allergies  Allergen Reactions  . Peanut Oil Swelling  . Potassium Iodide Other (See Comments)    facial swelling, also eyes, and ears  . Ace Inhibitors Cough  . Doxycycline Hives  . Peanut-Containing Drug Products Nausea And Vomiting  . Amlodipine Rash  . Codeine Nausea And Vomiting  . Latex Hives and Rash  . Penicillins Hives, Itching and Rash    .Marland KitchenHas patient had a PCN reaction causing immediate rash, facial/tongue/throat swelling, SOB or lightheadedness with hypotension: No Has patient had a PCN reaction causing severe rash involving mucus membranes or skin necrosis: No Has patient had a PCN reaction that required hospitalization No Has patient had a PCN reaction occurring within the last 10 years: YES (3 years ago)  If all of the above answers are "NO", then may proceed with Cephalosporin use.     Level of Care/Admitting Diagnosis ED Disposition    ED Disposition Condition [facility] Area: [place] [[id number]]  Level of Care: Telemetry [5]  Admit to tele based on following criteria: Monitor QTC interval  Covid Evaluation: Asymptomatic Screening Protocol (No Symptoms)  Diagnosis: Hypocalcemia [275.41.ICD-9-CM]  Admitting Physician: Rise Patience [phone]  Attending Physician: Rise Patience [phone]  PT Class (Do Not Modify): Observation [104]  PT Acc Code (Do Not Modify): Observation [10022]       B Medical/Surgery History Past  Medical History:  Diagnosis Date  . Allergy   . Anemia   . Arthritis   . Asthma   . Chronic bronchitis ([place])   . Connective tissue disease ([place])   . Diabetes mellitus   . Endometriosis   . Fibroid uterus   . GERD (gastroesophageal reflux disease)   . History of Doppler ultrasound    a. Carotid US 8/08:Estimated stenosis in the right and left internal carotid arteries is 0-50% and 0-50%.  . History of echocardiogram    a. Echo 6/16: EF 60-65%, no RWMA.  . History of hiatal hernia   . History of nuclear stress test    a. Myoview 6/16: There is no resting or stress perfusion defect consistent with no prior infarct and no ischemia. EF 73 %  The patient was hypertensive prior and during the study. Cardiac cath 04/2016 showed normal coronary arteries  . Hx of iron deficiency anemia   . Hypersomnia with sleep apnea   . Hypertension   . Lupus ([place]) 07/2013   Of Skin  . Menorrhagia   . Obesity   . Pneumonia   . 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 Secondary hyperthyroidism   . Thyroid disease   . Tinnitus    left ear  . Tubular adenoma of colon   . Vertigo   . Vitamin D deficiency    Past Surgical History:  Procedure Laterality Date  . ABDOMINAL HYSTERECTOMY  2004   supracervical hysterectomy  . BREAST SURGERY  2005   reduction  . BREATH TEK H PYLORI [name]  12/24/2014   Procedure: BREATH TEK H PYLORI;  Surgeon: Greer Pickerel, MD;  Location: [facility];  Service: General;  Laterality: [name];  . CARDIAC CATHETERIZATION [name] 03/22/2016   Procedure: Right/Left Heart Cath and Coronary Angiography;  Surgeon: Troy Sine, MD;  Location: [facility];  Service: Cardiovascular;  Laterality: [name];  . DILATION AND CURETTAGE OF UTERUS     age 81  . ESOPHAGOGASTRODUODENOSCOPY (EGD) WITH PROPOFOL [name] 10/14/2015   Procedure: ESOPHAGOGASTRODUODENOSCOPY (EGD) WITH PROPOFOL;  Surgeon: Clarene Essex, MD;  Location: [facility];  Service: Endoscopy;  Laterality: [name];  . LAPAROSCOPIC GASTRIC SLEEVE  RESECTION WITH HIATAL HERNIA REPAIR [name] 07/05/2015   Procedure: LAPAROSCOPIC GASTRIC SLEEVE RESECTION WITH HIATAL HERNIA REPAIR, upper endoscopy;  Surgeon: Greer Pickerel, MD;  Location: [facility];  Service: General;  Laterality: [name];  . REDUCTION MAMMAPLASTY Bilateral   . RIGHT HEART CATH [name] 10/13/2018   Procedure: RIGHT HEART CATH;  Surgeon: Larey Dresser, MD;  Location: [facility];  Service: Cardiovascular;  Laterality: [name];     A IV Location/Drains/Wounds Patient Lines/Drains/Airways Status   Active Line/Drains/Airways    Name:   Placement date:   Placement time:   Site:   Days:   Peripheral IV 03/27/19 Right Forearm   03/27/19    2156    Forearm   1   Airway   07/06/16    0843     995   Incision (Closed) 07/05/15 Abdomen Other (Comment)   07/05/15    1303     1362   Incision - 6 Ports Abdomen 1: Right;Upper 2: Right;Lower 3: Superior 4: Medial 5: Left;Upper 6: Left;Lower   07/05/15    -     1362          Intake/Output Last 24 hours  Intake/Output Summary (Last 24 hours) at 03/28/2019 1336 Last data filed at 03/28/2019 0214 Gross per 24 hour  Intake 172.21 ml  Output -  Net 172.21 ml    Labs/Imaging Results for orders placed or performed during the hospital encounter of 03/27/19 (from the past 48 hour(s))  CBC     Status: Abnormal   Collection Time: 03/27/19  4:38 PM  Result Value Ref Range   WBC 11.4 (H) 4.0 - 10.5 K/uL   RBC 4.20 3.87 - 5.11 MIL/uL   Hemoglobin 10.6 (L) 12.0 - 15.0 g/dL   HCT 34.7 (L) 36.0 - 46.0 %   MCV 82.6 80.0 - 100.0 fL   MCH 25.2 (L) 26.0 - 34.0 pg   MCHC 30.5 30.0 - 36.0 g/dL   RDW 16.4 (H) 11.5 - 15.5 %   Platelets 396 150 - 400 K/uL   nRBC 0.0 0.0 - 0.2 %    Comment: Performed at [facility], [place] [address], [place], [place] [address]  Comprehensive metabolic panel     Status: Abnormal   Collection Time: 03/27/19  4:38 PM  Result Value Ref Range   Sodium 143 135 - 145 mmol/L   Potassium 2.8 (L) 3.5 - 5.1 mmol/L    Chloride 101 98 - 111 mmol/L   CO2 28 22 - 32 mmol/L   Glucose, Bld 92 70 - 99 mg/dL   BUN 34 (H) 6 - 20 mg/dL   Creatinine, Ser 1.42 (H) 0.44 - 1.00 mg/dL   Calcium 5.6 (LL) 8.9 - 10.3 mg/dL    Comment: CRITICAL RESULT CALLED TO, READ BACK BY AND VERIFIED WITH: MCDONALD,M RN @2344  ON 03/27/2019 JACKSON,K  Total Protein 7.7 6.5 - 8.1 g/dL   Albumin 3.5 3.5 - 5.0 g/dL   AST 35 15 - 41 U/L   ALT 24 0 - 44 U/L   Alkaline Phosphatase 73 38 - 126 U/L   Total Bilirubin 0.3 0.3 - 1.2 mg/dL   GFR calc non Af Amer 41 (L) >60 mL/min   GFR calc Af Amer 48 (L) >60 mL/min   Anion gap 14 5 - 15    Comment: Performed at [facility], [place] [address], [place], [place] [address]  Magnesium     Status: None   Collection Time: 03/27/19  4:38 PM  Result Value Ref Range   Magnesium 2.4 1.7 - 2.4 mg/dL    Comment: Performed at [facility], [place] [address], [place], [place] [address]  Phosphorus     Status: Abnormal   Collection Time: 03/27/19  4:38 PM  Result Value Ref Range   Phosphorus 7.1 (H) 2.5 - 4.6 mg/dL    Comment: Performed at [facility], [place] [address] [place], [place] [address]  CK     Status: Abnormal   Collection Time: 03/27/19  4:38 PM  Result Value Ref Range   Total CK 453 (H) 38 - 234 U/L    Comment: Performed at [facility], [place] [address] [place], [place] [address]  Brain natriuretic peptide     Status: Abnormal   Collection Time: 03/27/19  4:38 PM  Result Value Ref Range   B Natriuretic Peptide 484.2 (H) 0.0 - 100.0 pg/mL    Comment: Performed at [facility], [place] [address] [place], [place] [address]  SARS CORONAVIRUS 2 Nasal Swab Aptima Multi Swab     Status: None   Collection Time: 03/28/19 12:15 AM   Specimen: Aptima Multi Swab; Nasal Swab  Result Value Ref Range   SARS Coronavirus 2 NEGATIVE NEGATIVE    Comment: (NOTE) SARS-CoV-2 target nucleic acids are NOT  DETECTED. The SARS-CoV-2 RNA is generally detectable in upper and lower respiratory specimens during the acute phase of infection. Negative results do not preclude SARS-CoV-2 infection, do not rule out co-infections with other pathogens, and should not be used as the sole basis for treatment or other patient management decisions. Negative results must be combined with clinical observations, patient history, and epidemiological information. The expected result is Negative. Fact Sheet for Patients: SugarRoll.be Fact Sheet for Healthcare Providers: https://www.woods-mathews.com/ This test is not yet approved or cleared by the [place] FDA and  has been authorized for detection and/or diagnosis of SARS-CoV-2 by FDA under an Emergency Use Authorization (EUA). This EUA will remain  in effect (meaning this test can be used) for the duration of the COVID-19 declaration under Section 56 4(b)(1) of the Act, 21 U.S.C. section 360bbb-3(b)(1), unless the authorization is terminated or revoked sooner. Performed at [facility], [place] [address], [place], [place] [address]   POC CBG, ED     Status: None   Collection Time: 03/28/19 12:17 AM  Result Value Ref Range   Glucose-Capillary 82 70 - 99 mg/dL  Basic metabolic panel     Status: Abnormal   Collection Time: 03/28/19  5:00 AM  Result Value Ref Range   Sodium 144 135 - 145 mmol/L   Potassium 3.4 (L) 3.5 - 5.1 mmol/L    Comment: DELTA CHECK NOTED NO VISIBLE HEMOLYSIS    Chloride 105 98 - 111 mmol/L   CO2 28 22 -  32 mmol/L   Glucose, Bld 97 70 - 99 mg/dL   BUN 34 (H) 6 - 20 mg/dL   Creatinine, Ser 1.42 (H) 0.44 - 1.00 mg/dL   Calcium 5.8 (LL) 8.9 - 10.3 mg/dL    Comment: CRITICAL RESULT CALLED TO, READ BACK BY AND VERIFIED WITH: T DOWD,RN 03/28/19 0554 RHOLMES    GFR calc non Af Amer 41 (L) >60 mL/min   GFR calc Af Amer 48 (L) >60 mL/min   Anion gap 11 5 - 15    Comment: Performed at  [facility], [place] [address] [place], [place] [address]  CBC     Status: Abnormal   Collection Time: 03/28/19  5:00 AM  Result Value Ref Range   WBC 9.5 4.0 - 10.5 K/uL   RBC 3.87 3.87 - 5.11 MIL/uL   Hemoglobin 9.5 (L) 12.0 - 15.0 g/dL   HCT 32.0 (L) 36.0 - 46.0 %   MCV 82.7 80.0 - 100.0 fL   MCH 24.5 (L) 26.0 - 34.0 pg   MCHC 29.7 (L) 30.0 - 36.0 g/dL   RDW 16.6 (H) 11.5 - 15.5 %   Platelets 377 150 - 400 K/uL   nRBC 0.0 0.0 - 0.2 %    Comment: Performed at [facility], [place] [address] [place], [place] [address]   Dg Chest 2 View  Result Date: 03/28/2019 CLINICAL DATA:  Cough and shortness of breath. EXAM: CHEST - 2 VIEW COMPARISON:  Radiograph 08/19/2018 FINDINGS: The cardiomediastinal contours are unchanged with mild cardiomegaly. Mild vascular congestion without pulmonary edema. Streaky bibasilar atelectasis or scarring. No consolidation, pleural effusion, or pneumothorax. No acute osseous abnormalities are seen. IMPRESSION: 1. Mild cardiomegaly with vascular congestion. 2. Streaky bibasilar atelectasis or scarring. Electronically Signed   By: Keith Rake M.D.   On: 03/28/2019 01:21    Pending Labs Unresulted Labs (From admission, onward)    Start     Ordered   03/28/19 0500  HIV antibody (Routine Testing)  Tomorrow morning,   R     03/28/19 0233   03/27/19 2343  Calcium, ionized  Once,   STAT     03/27/19 2343   03/27/19 2343  Parathyroid hormone, intact (no Ca)  Once,   STAT     03/27/19 2343          Vitals/Pain Today's Vitals   03/28/19 1100 03/28/19 1101 03/28/19 1130 03/28/19 1200  BP:  115/81 109/78 108/68  Pulse: 65 66 70 66  Resp: (!) 22 (!) 21 20 (!) 26  Temp:      TempSrc:      SpO2: 96% 97% 96% 98%  Weight:      PainSc:        Isolation Precautions No active isolations  Medications Medications  aspirin EC tablet 81 mg (81 mg Oral Given 03/28/19 0938)  bisoprolol (ZEBETA) tablet 2.5 mg (2.5 mg Oral Given  03/28/19 0939)  nitroGLYCERIN (NITROSTAT) SL tablet 0.4 mg (has no administration in time range)  Selexipag TABS 1,200 mcg (1,200 mcg Oral Not Given 03/28/19 1001)  temazepam (RESTORIL) capsule 15 mg (has no administration in time range)  levothyroxine (SYNTHROID) tablet 75 mcg (75 mcg Oral Given 03/28/19 0628)  mycophenolate (MYFORTIC) EC tablet 720 mg (720 mg Oral Given 03/28/19 0938)  multivitamin with minerals tablet 1 tablet (has no administration in time range)  acetaminophen (TYLENOL) tablet 650 mg (has no administration in time range)    Or  acetaminophen (TYLENOL) suppository 650 mg (  has no administration in time range)  calcium-vitamin D (OSCAL WITH D) 500-200 MG-UNIT per tablet 2 tablet (2 tablets Oral Given 03/28/19 0616)  magnesium oxide (MAG-OX) tablet 400 mg (400 mg Oral Given 03/28/19 0616)  ambrisentan (LETAIRIS) tablet 5 mg (5 mg Oral Not Given 03/28/19 1001)  calcium gluconate 1 g/ 50 mL sodium chloride IVPB (0 mg Intravenous Stopped 03/28/19 0113)  potassium chloride 10 mEq in 100 mL IVPB (0 mEq Intravenous Stopped 03/28/19 0214)  calcium carbonate (TUMS - dosed in mg elemental calcium) chewable tablet 400 mg of elemental calcium (400 mg of elemental calcium Oral Given 03/28/19 0110)  potassium chloride SA (K-DUR) CR tablet 60 mEq (60 mEq Oral Given 03/28/19 7628)  calcium gluconate 1 g/ 50 mL sodium chloride IVPB (0 g Intravenous Stopped 03/28/19 0715)  calcium carbonate (TUMS - dosed in mg elemental calcium) chewable tablet 400 mg of elemental calcium (400 mg of elemental calcium Oral Given 03/28/19 0656)    Mobility walks Low fall risk   Focused Assessments Cardiac Assessment Handoff:    Lab Results  Component Value Date   CKTOTAL 453 (H) 03/27/2019   TROPONINI <0.03 08/19/2018   Lab Results  Component Value Date   DDIMER 0.85 (H) 02/11/2013   Does the Patient currently have chest pain? No     R Recommendations: See Admitting Provider Note  Report given to:    Additional Notes:

## 2019-03-28 NOTE — ED Provider Notes (Signed)
Patient seen/examined in the Emergency Department in conjunction with Advanced Practice Provider McDonald Patient reports muscle spasms/cramping, found to have HYPOcalcemi Exam : awake/alert, no distress noted.  Plan: admit for HYPOcalcemia/HYPOkalemia     Ripley Fraise, MD 03/28/19 0025

## 2019-03-28 NOTE — Progress Notes (Signed)
58 year old lady with history of severe pulmonary hypertension, scleroderma, history of prolonged QTC was sent to ED by her endocrinologist for severe hypocalcemia and hypokalemia.   Please see Dr. Moise Boring detailed note of [place] evaluate this morning  Patient seen and examined at bedside she is alert afebrile and not in any kind of distress Cardiovascular system S1-S2 heard, regular rate rhythm Lungs clear to auscultation, no wheezing or rhonchi  Abdomen soft, nontender, nondistended with good bowel sounds Extremities 2+ leg edema, chronic Neurological patient is alert and oriented to person place and time    Plan   continue to replete calcium and repeat calcium levels tonight and tomorrow. Continue to replete potassium. Start Lasix from tomorrow.  Get PT/OT EVALUATION.  Get orthostatic vital signs in am.    Hosie Poisson, MD [phone]

## 2019-03-28 NOTE — H&P (Signed)
History and Physical    Sabrina [name] HFW:[id number] DOB: 1961-02-11 DOA: 03/27/2019  PCP: Fayrene Helper, MD  Patient coming from: Home.  Chief Complaint: Low calcium.  HPI: Sabrina [name] is a 58 y.o. female with history of severe pulmonary hypertension, scleroderma, prolonged QTC was instructed to come to the ER by patient's endocrinologist after patient's lab work showed hypocalcemia.  Patient had recently followed up with her endocrinologist and has been having chronic hypocalcemia and had not taken her calcium supplements for last 2 weeks.  Had lab work done showed hypocalcemia and was advised to come to the ER.  Patient denies any weakness of the extremities or any tingling or numbness.  ED Course: In the ER labs reveal calcium level of 5.6 EKG shows a QTC of 538 ms.  Patient was given 1 g IV calcium gluconate and p.o. calcium and admitted for hypercalcemia.  In addition patient is also found to be hypokalemic with potassium of 2.8.  Potassium replacements were given.  Review of Systems: As per HPI, rest all negative.   Past Medical History:  Diagnosis Date  . Allergy   . Anemia   . Arthritis   . Asthma   . Chronic bronchitis ([place])   . Connective tissue disease ([place])   . Diabetes mellitus   . Endometriosis   . Fibroid uterus   . GERD (gastroesophageal reflux disease)   . History of Doppler ultrasound    a. Carotid US 8/08:Estimated stenosis in the right and left internal carotid arteries is 0-50% and 0-50%.  . History of echocardiogram    a. Echo 6/16: EF 60-65%, no RWMA.  . History of hiatal hernia   . History of nuclear stress test    a. Myoview 6/16: There is no resting or stress perfusion defect consistent with no prior infarct and no ischemia. EF 73 %  The patient was hypertensive prior and during the study. Cardiac cath 04/2016 showed normal coronary arteries  . Hx of iron deficiency anemia   . Hypersomnia with sleep apnea   . Hypertension   .  Lupus ([place]) 07/2013   Of Skin  . Menorrhagia   . Obesity   . Pneumonia   . 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 Secondary hyperthyroidism   . Thyroid disease   . Tinnitus    left ear  . Tubular adenoma of colon   . Vertigo   . Vitamin D deficiency     Past Surgical History:  Procedure Laterality Date  . ABDOMINAL HYSTERECTOMY  2004   supracervical hysterectomy  . BREAST SURGERY  2005   reduction  . BREATH TEK H PYLORI [name] 12/24/2014   Procedure: BREATH TEK H PYLORI;  Surgeon: Greer Pickerel, MD;  Location: [facility];  Service: General;  Laterality: [name];  . CARDIAC CATHETERIZATION [name] 03/22/2016   Procedure: Right/Left Heart Cath and Coronary Angiography;  Surgeon: Troy Sine, MD;  Location: [facility];  Service: Cardiovascular;  Laterality: [name];  . DILATION AND CURETTAGE OF UTERUS     age 30  . ESOPHAGOGASTRODUODENOSCOPY (EGD) WITH PROPOFOL [name] 10/14/2015   Procedure: ESOPHAGOGASTRODUODENOSCOPY (EGD) WITH PROPOFOL;  Surgeon: Clarene Essex, MD;  Location: [facility];  Service: Endoscopy;  Laterality: [name];  . LAPAROSCOPIC GASTRIC SLEEVE RESECTION WITH HIATAL HERNIA REPAIR [name] 07/05/2015   Procedure: LAPAROSCOPIC GASTRIC SLEEVE RESECTION WITH HIATAL HERNIA REPAIR, upper endoscopy;  Surgeon: Greer Pickerel, MD;  Location: [facility];  Service: General;  Laterality: [name];  . REDUCTION MAMMAPLASTY  Bilateral   . RIGHT HEART CATH [name] 10/13/2018   Procedure: RIGHT HEART CATH;  Surgeon: Larey Dresser, MD;  Location: [facility];  Service: Cardiovascular;  Laterality: [name];     reports that she has never smoked. She has never used smokeless tobacco. She reports that she does not drink alcohol or use drugs.  Allergies  Allergen Reactions  . Peanut Oil Swelling  . Potassium Iodide Other (See Comments)    facial swelling, also eyes, and ears  . Ace Inhibitors Cough  . Doxycycline Hives  . Peanut-Containing Drug Products Nausea And Vomiting  . Amlodipine Rash  . Codeine Nausea  And Vomiting  . Latex Hives and Rash  . Penicillins Hives, Itching and Rash    .Marland KitchenHas patient had a PCN reaction causing immediate rash, facial/tongue/throat swelling, SOB or lightheadedness with hypotension: No Has patient had a PCN reaction causing severe rash involving mucus membranes or skin necrosis: No Has patient had a PCN reaction that required hospitalization No Has patient had a PCN reaction occurring within the last 10 years: YES (3 years ago)  If all of the above answers are "NO", then may proceed with Cephalosporin use.     Family History  Problem Relation Age of Onset  . Hypertension Mother   . Diabetes Mother   . Arthritis Mother   . Depression Mother   . CAD Mother        s/p PCI  . Diabetes Father   . Stroke Father   . Arthritis Father   . Depression Father   . Hypertension Father   . Cancer Father        prostate  . Hypertension Sister        x 2  . Breast cancer Sister   . Arthritis Sister   . Bleeding Disorder Sister   . Depression Sister   . Thyroid disease Sister   . Colon cancer Maternal Grandfather   . Stroke Maternal Aunt   . Heart attack Neg Hx     Prior to Admission medications   Medication Sig Start Date End Date Taking? Authorizing Provider  acetaminophen (TYLENOL) 500 MG tablet Take 500-1,000 mg by mouth every 6 (six) hours as needed for headache.    Yes [provider]  ambrisentan (LETAIRIS) 5 MG tablet Take 1 tablet (5 mg total) by mouth daily. 12/08/18  Yes Larey Dresser, MD  aspirin EC 81 MG tablet Take 1 tablet (81 mg total) by mouth daily. 03/20/16  Yes Weaver, Scott T, PA-C  bisoprolol (ZEBETA) 5 MG tablet Take 0.5 tablets (2.5 mg total) by mouth daily. 03/12/19  Yes Larey Dresser, MD  Calcium-Magnesium-Vitamin D [phone] MG-MG-UNIT TABS Take 2 tablets by mouth 2 (two) times daily. 03/26/19  Yes Nida, Marella Chimes, MD  levothyroxine (SYNTHROID) 75 MCG tablet TAKE 1 TABLET (75 MCG TOTAL) BY MOUTH DAILY BEFORE BREAKFAST.  03/03/19  Yes Nida, Marella Chimes, MD  Multiple Vitamin (MULTIVITAMIN WITH MINERALS) TABS tablet Take 1 tablet by mouth daily.   Yes [provider]  mycophenolate (MYFORTIC) 360 MG TBEC EC tablet Take 720 mg by mouth 2 (two) times daily.  04/04/18  Yes [provider]  pantoprazole (PROTONIX) 20 MG tablet Take 20 mg by mouth See admin instructions. Take 1 tablet (20 mg) by mouth scheduled each morning, may take an additional dose in the evening if needed for heartburn/indigestion. 10/01/18  Yes [provider]  potassium chloride (K-DUR) 10 MEQ tablet Take 2 tablets  by mouth twice daily 12/01/18  Yes Fayrene Helper, MD  Selexipag (UPTRAVI) 1200 MCG TABS Take 1,200 mcg by mouth every 12 (twelve) hours. 01/22/19  Yes Larey Dresser, MD  temazepam (RESTORIL) 15 MG capsule Take 1 capsule (15 mg total) by mouth at bedtime as needed for sleep. 03/10/19  Yes Fayrene Helper, MD  torsemide (DEMADEX) 20 MG tablet Take 2 tablets (40 mg total) by mouth daily. 03/12/19 03/06/20 Yes Larey Dresser, MD  triamcinolone ointment (KENALOG) 0.1 % Apply 1 application topically 2 (two) times daily as needed (dry/irritated skin.). APPLY AS DIRECTED TO ITCHING SPOTS ON THE BODY DAILY TO TWICE DAILY AS NEEDED. NOT TO FACE.    Yes [provider]  nitroGLYCERIN (NITROSTAT) 0.4 MG SL tablet Place 0.4 mg under the tongue every 5 (five) minutes x 3 doses as needed for chest pain.     [provider]  UNABLE TO FIND Overnight Pulse Oximetry (to evaluate the need for continued nocturnal oxygen)  DX I27.20 09/17/18   Fayrene Helper, MD    Physical Exam: Constitutional: Moderately built and nourished. Vitals:   03/27/19 2321 03/28/19 0012 03/28/19 0030 03/28/19 0100  BP: 114/79 117/75 108/80 110/74  Pulse: 71 67 77 71  Resp: 16 20 17 20   Temp:      TempSrc:      SpO2: 99% 97% 98% 92%  Weight:       Eyes: Anicteric no pallor. ENMT: No discharge from the ears eyes  nose or mouth. Neck: No mass felt.  No neck rigidity. Respiratory: No rhonchi or crepitations. Cardiovascular: S1-S2 heard. Abdomen: Soft nontender bowel sounds present. Musculoskeletal: Mild edema. Skin: No rash. Neurologic: Alert awake oriented to time place and person.  Moves all extremities. Psychiatric: Appears normal per normal affect.   Labs on Admission: I have personally reviewed following labs and imaging studies  CBC: Recent Labs  Lab 03/27/19 1638  WBC 11.4*  HGB 10.6*  HCT 34.7*  MCV 82.6  PLT 009   Basic Metabolic Panel: Recent Labs  Lab 03/27/19 1638  NA 143  K 2.8*  CL 101  CO2 28  GLUCOSE 92  BUN 34*  CREATININE 1.42*  CALCIUM 5.6*  MG 2.4  PHOS 7.1*   GFR: Estimated Creatinine Clearance: 41.5 mL/min (A) (by C-G formula based on SCr of 1.42 mg/dL (H)). Liver Function Tests: Recent Labs  Lab 03/27/19 1638  AST 35  ALT 24  ALKPHOS 73  BILITOT 0.3  PROT 7.7  ALBUMIN 3.5   No results for input(s): LIPASE, AMYLASE in the last 168 hours. No results for input(s): AMMONIA in the last 168 hours. Coagulation Profile: No results for input(s): INR, PROTIME in the last 168 hours. Cardiac Enzymes: Recent Labs  Lab 03/27/19 1638  CKTOTAL 453*   BNP (last 3 results) No results for input(s): PROBNP in the last 8760 hours. HbA1C: No results for input(s): HGBA1C in the last 72 hours. CBG: Recent Labs  Lab 03/28/19 0017  GLUCAP 82   Lipid Profile: No results for input(s): CHOL, HDL, LDLCALC, TRIG, CHOLHDL, LDLDIRECT in the last 72 hours. Thyroid Function Tests: No results for input(s): TSH, T4TOTAL, FREET4, T3FREE, THYROIDAB in the last 72 hours. Anemia Panel: No results for input(s): VITAMINB12, FOLATE, FERRITIN, TIBC, IRON, RETICCTPCT in the last 72 hours. Urine analysis:    Component Value Date/Time   COLORURINE YELLOW 07/30/2016 [place] 07/30/2016 1837   LABSPEC 1.020 07/30/2016 1837   PHURINE 7.0  07/30/2016 1837    GLUCOSEU NEGATIVE 07/30/2016 1837   HGBUR NEGATIVE 07/30/2016 1837   BILIRUBINUR NEGATIVE 07/30/2016 [place] 07/30/2016 1837   PROTEINUR 100 (A) 07/30/2016 1837   UROBILINOGEN 0.2 10/13/2014 2352   NITRITE NEGATIVE 07/30/2016 1837   LEUKOCYTESUR NEGATIVE 07/30/2016 1837   Sepsis Labs: @LABRCNTIP (procalcitonin:4,lacticidven:4) )No results found for this or any previous visit (from the past 240 hour(s)).   Radiological Exams on Admission: No results found.  EKG: Independently reviewed.  Normal sinus rhythm prolonged QTC 538 ms.  Nonspecific ST changes.  Assessment/Plan Principal Problem:   Hypocalcemia Active Problems:   Hypothyroidism   Essential hypertension   Iron deficiency anemia   Tubular adenoma of colon   Metabolic syndrome X   Scleroderma (HCC)   Pulmonary hypertension (HCC)   Chronic diastolic heart failure (HCC)   Prolonged QT syndrome   ILD (interstitial lung disease) ([place])   Diabetes mellitus without complication ([place])    1. Severe hypocalcemia and hypokalemia with prolonged QTC -IV calcium and IV potassium and oral potassium and oral potassium has been replaced.  Repeat labs.  Close monitoring telemetry. 2. Severe pulmonary hypertension on ambrisentan and Uptravi recently had followed up with Dr. Emeline Darling cardiologist.  Will hold patient's torsemide for now until electrolytes are corrected. 3. Scleroderma on mycophenolate followed by [facility]. 4. Iron deficiency anemia -follow CBC. 5. Hypothyroidism on Synthroid. 6. Diabetes mellitus type 2 per the chart.  Patient not on any medication.  Last hemoglobin A1c was 5.7 in 2019.  COVID-19 test is pending.   DVT prophylaxis: Lovenox. Code Status: Full code. Family Communication: Discussed with patient's family at the bedside. Disposition Plan: Home. Consults called: None. Admission status: Observation.   Rise Patience MD [facility] Pager [phone].  If 7PM-7AM, please  contact night-coverage [email or web address] Password [email or web address]  03/28/2019, 1:16 AM

## 2019-03-29 DIAGNOSIS — Z880 Allergy status to penicillin: Secondary | ICD-10-CM | POA: Diagnosis not present

## 2019-03-29 DIAGNOSIS — E039 Hypothyroidism, unspecified: Secondary | ICD-10-CM | POA: Diagnosis present

## 2019-03-29 DIAGNOSIS — D509 Iron deficiency anemia, unspecified: Secondary | ICD-10-CM | POA: Diagnosis present

## 2019-03-29 DIAGNOSIS — I10 Essential (primary) hypertension: Secondary | ICD-10-CM | POA: Diagnosis not present

## 2019-03-29 DIAGNOSIS — E119 Type 2 diabetes mellitus without complications: Secondary | ICD-10-CM

## 2019-03-29 DIAGNOSIS — Z91040 Latex allergy status: Secondary | ICD-10-CM | POA: Diagnosis not present

## 2019-03-29 DIAGNOSIS — Z833 Family history of diabetes mellitus: Secondary | ICD-10-CM | POA: Diagnosis not present

## 2019-03-29 DIAGNOSIS — Z20828 Contact with and (suspected) exposure to other viral communicable diseases: Secondary | ICD-10-CM | POA: Diagnosis present

## 2019-03-29 DIAGNOSIS — R9431 Abnormal electrocardiogram [ECG] [EKG]: Secondary | ICD-10-CM | POA: Diagnosis present

## 2019-03-29 DIAGNOSIS — E8881 Metabolic syndrome: Secondary | ICD-10-CM | POA: Diagnosis present

## 2019-03-29 DIAGNOSIS — Z885 Allergy status to narcotic agent status: Secondary | ICD-10-CM | POA: Diagnosis not present

## 2019-03-29 DIAGNOSIS — I5032 Chronic diastolic (congestive) heart failure: Secondary | ICD-10-CM | POA: Diagnosis not present

## 2019-03-29 DIAGNOSIS — M349 Systemic sclerosis, unspecified: Secondary | ICD-10-CM | POA: Diagnosis present

## 2019-03-29 DIAGNOSIS — Z888 Allergy status to other drugs, medicaments and biological substances status: Secondary | ICD-10-CM | POA: Diagnosis not present

## 2019-03-29 DIAGNOSIS — J849 Interstitial pulmonary disease, unspecified: Secondary | ICD-10-CM | POA: Diagnosis present

## 2019-03-29 DIAGNOSIS — E876 Hypokalemia: Secondary | ICD-10-CM | POA: Diagnosis present

## 2019-03-29 DIAGNOSIS — Z8249 Family history of ischemic heart disease and other diseases of the circulatory system: Secondary | ICD-10-CM | POA: Diagnosis not present

## 2019-03-29 DIAGNOSIS — Z79899 Other long term (current) drug therapy: Secondary | ICD-10-CM | POA: Diagnosis not present

## 2019-03-29 DIAGNOSIS — Z90711 Acquired absence of uterus with remaining cervical stump: Secondary | ICD-10-CM | POA: Diagnosis not present

## 2019-03-29 DIAGNOSIS — Z7982 Long term (current) use of aspirin: Secondary | ICD-10-CM | POA: Diagnosis not present

## 2019-03-29 DIAGNOSIS — Z79890 Hormone replacement therapy (postmenopausal): Secondary | ICD-10-CM | POA: Diagnosis not present

## 2019-03-29 DIAGNOSIS — I5033 Acute on chronic diastolic (congestive) heart failure: Secondary | ICD-10-CM | POA: Diagnosis present

## 2019-03-29 DIAGNOSIS — I272 Pulmonary hypertension, unspecified: Secondary | ICD-10-CM | POA: Diagnosis present

## 2019-03-29 DIAGNOSIS — I110 Hypertensive heart disease with heart failure: Secondary | ICD-10-CM | POA: Diagnosis present

## 2019-03-29 DIAGNOSIS — Z91010 Allergy to peanuts: Secondary | ICD-10-CM | POA: Diagnosis not present

## 2019-03-29 LAB — BASIC METABOLIC PANEL
Anion gap: 11 (ref 5–15)
Anion gap: 16 — ABNORMAL HIGH (ref 5–15)
BUN: 29 mg/dL — ABNORMAL HIGH (ref 6–20)
BUN: 32 mg/dL — ABNORMAL HIGH (ref 6–20)
CO2: 24 mmol/L (ref 22–32)
CO2: 21 mmol/L — ABNORMAL LOW (ref 22–32)
Calcium: 6.9 mg/dL — ABNORMAL LOW (ref 8.9–10.3)
Calcium: 6.9 mg/dL — ABNORMAL LOW (ref 8.9–10.3)
Chloride: 107 mmol/L (ref 98–111)
Chloride: 104 mmol/L (ref 98–111)
Creatinine, Ser: 1.22 mg/dL — ABNORMAL HIGH (ref 0.44–1.00)
Creatinine, Ser: 1.5 mg/dL — ABNORMAL HIGH (ref 0.44–1.00)
GFR calc Af Amer: 58 mL/min — ABNORMAL LOW (ref >60)
GFR calc Af Amer: 45 mL/min — ABNORMAL LOW (ref >60)
GFR calc non Af Amer: 50 mL/min — ABNORMAL LOW (ref >60)
GFR calc non Af Amer: 39 mL/min — ABNORMAL LOW (ref >60)
Glucose, Bld: 92 mg/dL (ref 70–99)
Glucose, Bld: 98 mg/dL (ref 70–99)
Potassium: 3.9 mmol/L (ref 3.5–5.1)
Potassium: 4.3 mmol/L (ref 3.5–5.1)
Sodium: 142 mmol/L (ref 135–145)
Sodium: 141 mmol/L (ref 135–145)

## 2019-03-29 LAB — CBC
HCT: 33.8 % — ABNORMAL LOW (ref 36.0–46.0)
Hemoglobin: 9.9 g/dL — ABNORMAL LOW (ref 12.0–15.0)
MCH: 24.6 pg — ABNORMAL LOW (ref 26.0–34.0)
MCHC: 29.3 g/dL — ABNORMAL LOW (ref 30.0–36.0)
MCV: 83.9 fL (ref 80.0–100.0)
Platelets: 401 10*3/uL — ABNORMAL HIGH (ref 150–400)
RBC: 4.03 MIL/uL (ref 3.87–5.11)
RDW: 16.6 % — ABNORMAL HIGH (ref 11.5–15.5)
WBC: 9.4 10*3/uL (ref 4.0–10.5)
nRBC: 0 % (ref 0.0–0.2)

## 2019-03-29 LAB — GLUCOSE, CAPILLARY: Glucose-Capillary: 73 mg/dL (ref 70–99)

## 2019-03-29 LAB — PHOSPHORUS: Phosphorus: 5.7 mg/dL — ABNORMAL HIGH (ref 2.5–4.6)

## 2019-03-29 LAB — CALCIUM, IONIZED: Calcium, Ionized, Serum: <3 mg/dL — ABNORMAL LOW (ref 4.5–5.6)

## 2019-03-29 LAB — ALBUMIN: Albumin: 3.2 g/dL — ABNORMAL LOW (ref 3.5–5.0)

## 2019-03-29 MED ORDER — FUROSEMIDE 10 MG/ML IJ SOLN
40.0000 mg | Freq: Once | INTRAMUSCULAR | Status: AC
  Administered 2019-03-29: 40 mg via INTRAVENOUS
  Filled 2019-03-29: qty 4

## 2019-03-29 MED ORDER — FUROSEMIDE 40 MG PO TABS
40.0000 mg | ORAL_TABLET | Freq: Two times a day (BID) | ORAL | Status: DC
  Administered 2019-03-29 – 2019-03-30 (×2): 40 mg via ORAL
  Filled 2019-03-29 (×2): qty 1

## 2019-03-29 NOTE — Progress Notes (Addendum)
PROGRESS NOTE    Sabrina [name]  AYO:[id number] DOB: Jan 01, 1961 DOA: 03/27/2019 PCP: Fayrene Helper, MD    Brief Narrative:  58 year old lady with history of severe pulmonary hypertension, scleroderma, history of prolonged QTC was sent to ED by her endocrinologist for severe hypocalcemia and hypokalemia. On admission she was found to have pulm vascular congestion on CXR and was sob on exertion.    Assessment & Plan:   Principal Problem:   Hypocalcemia Active Problems:   Hypothyroidism   Essential hypertension   Iron deficiency anemia   Tubular adenoma of colon   Metabolic syndrome X   Scleroderma (HCC)   Pulmonary hypertension (HCC)   Chronic diastolic heart failure (HCC)   Prolonged QT syndrome   ILD (interstitial lung disease) (HCC)   Diabetes mellitus without complication (HCC)   Severe hypocalcemia Replaced and repeat levels tomorrow.   Severe hypokalemia replaced repeat within normal limits.   Mild acute on chronic diastolic heart failure Chest x-ray on admission showed pulmonary vascular congestion and patient reports shortness of breath on exertion. 1 dose of IV Lasix ordered followed by oral Lasix. Continue with strict intake and output and watch renal parameters while on Lasix.  Continue with daily weights.  Essential hypertension Well-controlled   Prolonged QTC After repletion of electrolytes her EKG showed improvement in QTC.  Iron deficiency anemia hemoglobin stable around 9  Diabetes mellitus without complication CBG (last 3)  Recent Labs    03/28/19 0017 03/29/19 0024  GLUCAP 82 73   Diet controlled and further no changes in medications.   Severe Pulmonary hypertension Resume ambrisentan and selexipag tablets as recommended.   Hypo-thyroidism Continue with Synthroid 75 MCG daily.   DVT prophylaxis: Heparin Code Status: Full code Family Communication:  Discussed with family at bedside  disposition Plan: pending  resolution of sob. .   Consultants:   None  Procedures: None Antimicrobials: None  Subjective: Patient reports some shortness of breath because she has missed her diuretic.  Objective: Vitals:   03/28/19 1425 03/28/19 2137 03/29/19 0606 03/29/19 1411  BP: 121/84 126/70 99/67 111/73  Pulse: 70 (!) 56 67 68  Resp: (!) 23 18 18 18   Temp: 98 F (36.7 C) 98.3 F (36.8 C) 97.7 F (36.5 C) 98.6 F (37 C)  TempSrc: Oral Oral Oral Oral  SpO2: 95% 95% 94% 95%  Weight:   90.6 kg     Intake/Output Summary (Last 24 hours) at 03/29/2019 1655 Last data filed at 03/29/2019 0600 Gross per 24 hour  Intake 820 ml  Output -  Net 820 ml   Filed Weights   03/27/19 1634 03/29/19 0606  Weight: 82 kg 90.6 kg    Examination:  General exam: Appears calm and comfortable does not appear to be in distress Respiratory system: Diminished at bases, no wheezing or rhonchi  cardiovascular system: S1 & S2 heard, RRR.  2+ leg edema  gastrointestinal system: Abdomen is soft, nontender, nondistended Central nervous system: Alert and oriented.  Nonfocal Extremities 2+ leg edema Skin: Scleroderma changes on the back and chest wall Psychiatry: l. Mood & affect appropriate.     Data Reviewed: I have personally reviewed following labs and imaging studies  CBC: Recent Labs  Lab 03/27/19 1638 03/28/19 0500 03/29/19 0517  WBC 11.4* 9.5 9.4  HGB 10.6* 9.5* 9.9*  HCT 34.7* 32.0* 33.8*  MCV 82.6 82.7 83.9  PLT 396 377 239*   Basic Metabolic Panel: Recent Labs  Lab 03/27/19 1638 03/28/19 0500 03/28/19 1713  03/29/19 0517  NA 143 144 142 142  K 2.8* 3.4* 3.8 3.9  CL 101 105 105 107  CO2 28 28 24 24   GLUCOSE 92 97 118* 92  BUN 34* 34* 32* 29*  CREATININE 1.42* 1.42* 1.38* 1.22*  CALCIUM 5.6* 5.8* 6.1* 6.9*  MG 2.4  --   --   --   PHOS 7.1*  --   --  5.7*   GFR: Estimated Creatinine Clearance: 51.2 mL/min (A) (by C-G formula based on SCr of 1.22 mg/dL (H)). Liver Function Tests:  Recent Labs  Lab 03/27/19 1638 03/29/19 0517  AST 35  --   ALT 24  --   ALKPHOS 73  --   BILITOT 0.3  --   PROT 7.7  --   ALBUMIN 3.5 3.2*   No results for input(s): LIPASE, AMYLASE in the last 168 hours. No results for input(s): AMMONIA in the last 168 hours. Coagulation Profile: No results for input(s): INR, PROTIME in the last 168 hours. Cardiac Enzymes: Recent Labs  Lab 03/27/19 1638  CKTOTAL 453*   BNP (last 3 results) No results for input(s): PROBNP in the last 8760 hours. HbA1C: No results for input(s): HGBA1C in the last 72 hours. CBG: Recent Labs  Lab 03/28/19 0017 03/29/19 0024  GLUCAP 82 73   Lipid Profile: No results for input(s): CHOL, HDL, LDLCALC, TRIG, CHOLHDL, LDLDIRECT in the last 72 hours. Thyroid Function Tests: No results for input(s): TSH, T4TOTAL, FREET4, T3FREE, THYROIDAB in the last 72 hours. Anemia Panel: No results for input(s): VITAMINB12, FOLATE, FERRITIN, TIBC, IRON, RETICCTPCT in the last 72 hours. Sepsis Labs: No results for input(s): PROCALCITON, LATICACIDVEN in the last 168 hours.  Recent Results (from the past 240 hour(s))  SARS CORONAVIRUS 2 Nasal Swab Aptima Multi Swab     Status: None   Collection Time: 03/28/19 12:15 AM   Specimen: Aptima Multi Swab; Nasal Swab  Result Value Ref Range Status   SARS Coronavirus 2 NEGATIVE NEGATIVE Final    Comment: (NOTE) SARS-CoV-2 target nucleic acids are NOT DETECTED. The SARS-CoV-2 RNA is generally detectable in upper and lower respiratory specimens during the acute phase of infection. Negative results do not preclude SARS-CoV-2 infection, do not rule out co-infections with other pathogens, and should not be used as the sole basis for treatment or other patient management decisions. Negative results must be combined with clinical observations, patient history, and epidemiological information. The expected result is Negative. Fact Sheet for Patients:  SugarRoll.be Fact Sheet for Healthcare Providers: https://www.woods-mathews.com/ This test is not yet approved or cleared by the [place] FDA and  has been authorized for detection and/or diagnosis of SARS-CoV-2 by FDA under an Emergency Use Authorization (EUA). This EUA will remain  in effect (meaning this test can be used) for the duration of the COVID-19 declaration under Section 56 4(b)(1) of the Act, 21 U.S.C. section 360bbb-3(b)(1), unless the authorization is terminated or revoked sooner. Performed at [facility], [place] [address] [place], [place] [address]          Radiology Studies: Dg Chest 2 View  Result Date: 03/28/2019 CLINICAL DATA:  Cough and shortness of breath. EXAM: CHEST - 2 VIEW COMPARISON:  Radiograph 08/19/2018 FINDINGS: The cardiomediastinal contours are unchanged with mild cardiomegaly. Mild vascular congestion without pulmonary edema. Streaky bibasilar atelectasis or scarring. No consolidation, pleural effusion, or pneumothorax. No acute osseous abnormalities are seen. IMPRESSION: 1. Mild cardiomegaly with vascular congestion. 2. Streaky bibasilar atelectasis or scarring. Electronically Signed  By: Keith Rake M.D.   On: 03/28/2019 01:21        Scheduled Meds: . ambrisentan  5 mg Oral Daily  . aspirin EC  81 mg Oral Daily  . bisoprolol  2.5 mg Oral Daily  . calcium-vitamin D  2 tablet Oral BID  . furosemide  40 mg Oral BID  . heparin injection (subcutaneous)  5,000 Units Subcutaneous Q8H  . levothyroxine  75 mcg Oral QAC breakfast  . magnesium oxide  400 mg Oral BID  . multivitamin with minerals  1 tablet Oral Daily  . mycophenolate  720 mg Oral BID  . Selexipag  1,200 mcg Oral Q12H   Continuous Infusions:   LOS: 0 days    Time spent: 38 minutes     Hosie Poisson, MD [facility] Pager [phone]  If 7PM-7AM, please contact night-coverage [email or web address] Password [facility]  03/29/2019, 4:55 PM

## 2019-03-29 NOTE — Progress Notes (Signed)
PT Cancellation Note  Patient Details Name: TALANA SLATTEN MRN: [id number] DOB: 04-22-1961   Cancelled Treatment:    Reason Eval/Treat Not Completed: PT screened, no needs identified, will sign off   Weston Anna, PT Acute Rehabilitation Services Pager: [phone] Office: [phone]

## 2019-03-29 NOTE — Progress Notes (Signed)
Pt up to chair most of the day with no complaints. Will continue to monitor.

## 2019-03-30 ENCOUNTER — Inpatient Hospital Stay (HOSPITAL_COMMUNITY): Payer: 59

## 2019-03-30 LAB — BASIC METABOLIC PANEL
Anion gap: 14 (ref 5–15)
Anion gap: 12 (ref 5–15)
BUN: 28 mg/dL — ABNORMAL HIGH (ref 6–20)
BUN: 27 mg/dL — ABNORMAL HIGH (ref 6–20)
CO2: 26 mmol/L (ref 22–32)
CO2: 26 mmol/L (ref 22–32)
Calcium: 6.9 mg/dL — ABNORMAL LOW (ref 8.9–10.3)
Calcium: 7.1 mg/dL — ABNORMAL LOW (ref 8.9–10.3)
Chloride: 102 mmol/L (ref 98–111)
Chloride: 102 mmol/L (ref 98–111)
Creatinine, Ser: 1.38 mg/dL — ABNORMAL HIGH (ref 0.44–1.00)
Creatinine, Ser: 1.35 mg/dL — ABNORMAL HIGH (ref 0.44–1.00)
GFR calc Af Amer: 50 mL/min — ABNORMAL LOW (ref >60)
GFR calc Af Amer: 51 mL/min — ABNORMAL LOW (ref >60)
GFR calc non Af Amer: 43 mL/min — ABNORMAL LOW (ref >60)
GFR calc non Af Amer: 44 mL/min — ABNORMAL LOW (ref >60)
Glucose, Bld: 85 mg/dL (ref 70–99)
Glucose, Bld: 114 mg/dL — ABNORMAL HIGH (ref 70–99)
Potassium: 3.3 mmol/L — ABNORMAL LOW (ref 3.5–5.1)
Potassium: 3.6 mmol/L (ref 3.5–5.1)
Sodium: 142 mmol/L (ref 135–145)
Sodium: 140 mmol/L (ref 135–145)

## 2019-03-30 LAB — TSH: TSH: 2.437 u[IU]/mL (ref 0.350–4.500)

## 2019-03-30 LAB — PARATHYROID HORMONE, INTACT (NO CA): PTH: 106 pg/mL — ABNORMAL HIGH (ref 15–65)

## 2019-03-30 MED ORDER — CALCIUM GLUCONATE-NACL 1-0.675 GM/50ML-% IV SOLN
1.0000 g | Freq: Once | INTRAVENOUS | Status: AC
  Administered 2019-03-30: 1000 mg via INTRAVENOUS
  Filled 2019-03-30: qty 50

## 2019-03-30 MED ORDER — CALCIUM-MAGNESIUM-VITAMIN D 500-250-125 MG-MG-UNIT PO TABS: 2.0000 | ORAL_TABLET | Freq: Two times a day (BID) | ORAL | 3 refills | Status: DC

## 2019-03-30 MED ORDER — POTASSIUM CHLORIDE CRYS ER 20 MEQ PO TBCR
40.0000 meq | EXTENDED_RELEASE_TABLET | Freq: Once | ORAL | Status: AC
  Administered 2019-03-30: 40 meq via ORAL
  Filled 2019-03-30: qty 2

## 2019-03-30 MED ORDER — PANTOPRAZOLE SODIUM 40 MG PO TBEC: 40.0000 mg | DELAYED_RELEASE_TABLET | Freq: Every day | ORAL | Status: DC

## 2019-03-30 NOTE — Discharge Summary (Signed)
Physician Discharge Summary  Sabrina [name] ZOX:[id number] DOB: 1960/08/16 DOA: 03/27/2019  PCP: Fayrene Helper, MD  Admit date: 03/27/2019 Discharge date: 03/30/2019  Admitted From: Home.  Disposition: Home.   Recommendations for Outpatient Follow-up:  1. Follow up with PCP in 1-2 weeks 2. Please obtain BMP/CBC in one week 3. Please follow up endocrinology by the end of the week.    Discharge Condition:stable.  CODE STATUS:full code.  Diet recommendation: Heart Healthy   Brief/Interim Summary: 58 year old lady with history of severe pulmonary hypertension, scleroderma, history of prolonged QTC was sent to ED by her endocrinologist for severe hypocalcemia and hypokalemia. On admission she was found to have pulm vascular congestion on CXR and was sob on exertion.  She reports she ran out of medications for 2 weeks.  Her potassium and calcium levels have improved and after discussing with Dr Dorris Fetch, discharged her home home.    Discharge Diagnoses:  Principal Problem:   Hypocalcemia Active Problems:   Hypothyroidism   Essential hypertension   Iron deficiency anemia   Tubular adenoma of colon   Metabolic syndrome X   Scleroderma (HCC)   Pulmonary hypertension (HCC)   Chronic diastolic heart failure (HCC)   Prolonged QT syndrome   ILD (interstitial lung disease) (HCC)   Diabetes mellitus without complication (HCC)  Severe hypocalcemia Replaced and repeat levels show much improvement from 5.7 to 7.1  She has received 3 gms of calcium gluconate and was given prescription for oral supplementation.  PTH levels, ionized calcium, and vit d levels ordered and recommended to follow up as outpatient.    Severe hypokalemia  replaced repeat within normal limits.   Mild acute on chronic diastolic heart failure Chest x-ray on admission showed pulmonary vascular congestion and patient reports shortness of breath on exertion. 1 dose of IV Lasix ordered followed by oral  Lasix.   Essential hypertension Well-controlled   Prolonged QTC After repletion of electrolytes her EKG showed improvement in QTC.  Iron deficiency anemia hemoglobin stable around 9  Diabetes mellitus without complication CBG (last 3)  Recent Labs (last 2 labs)       Recent Labs    03/28/19 0017 03/29/19 0024  GLUCAP 82 73     Diet controlled and further no changes in medications.   Severe Pulmonary hypertension Resume ambrisentan and selexipag tablets as recommended.   Hypo-thyroidism Continue with Synthroid 75 MCG daily.    Discharge Instructions  Discharge Instructions    Diet - low sodium heart healthy   Complete by: As directed    Discharge instructions   Complete by: As directed    Follow up with Dr Dorris Fetch as recommended and follow up the lab work later this week.     Allergies as of 03/30/2019      Reactions   Peanut Oil Swelling   Potassium Iodide Other (See Comments)   facial swelling, also eyes, and ears   Ace Inhibitors Cough   Doxycycline Hives   Peanut-containing Drug Products Nausea And Vomiting   Amlodipine Rash   Codeine Nausea And Vomiting   Latex Hives, Rash   Penicillins Hives, Itching, Rash   .Marland KitchenHas patient had a PCN reaction causing immediate rash, facial/tongue/throat swelling, SOB or lightheadedness with hypotension: No Has patient had a PCN reaction causing severe rash involving mucus membranes or skin necrosis: No Has patient had a PCN reaction that required hospitalization No Has patient had a PCN reaction occurring within the last 10 years: YES (3 years ago)  If  all of the above answers are "NO", then may proceed with Cephalosporin use.      Medication List    STOP taking these medications   ergocalciferol 1.25 MG (50000 UT) capsule Commonly known as: VITAMIN D2     TAKE these medications   acetaminophen 500 MG tablet Commonly known as: TYLENOL Take 500-1,000 mg by mouth every 6 (six) hours as needed for  headache.   ambrisentan 5 MG tablet Commonly known as: LETAIRIS Take 1 tablet (5 mg total) by mouth daily.   aspirin EC 81 MG tablet Take 1 tablet (81 mg total) by mouth daily.   bisoprolol 5 MG tablet Commonly known as: ZEBETA Take 0.5 tablets (2.5 mg total) by mouth daily.   Calcium-Magnesium-Vitamin D [phone] MG-MG-UNIT Tabs Take 2 tablets by mouth 2 (two) times daily.   levothyroxine 75 MCG tablet Commonly known as: SYNTHROID TAKE 1 TABLET (75 MCG TOTAL) BY MOUTH DAILY BEFORE BREAKFAST.   multivitamin with minerals Tabs tablet Take 1 tablet by mouth daily.   mycophenolate 360 MG Tbec EC tablet Commonly known as: MYFORTIC Take 720 mg by mouth 2 (two) times daily.   nitroGLYCERIN 0.4 MG SL tablet Commonly known as: NITROSTAT Place 0.4 mg under the tongue every 5 (five) minutes x 3 doses as needed for chest pain.   pantoprazole 20 MG tablet Commonly known as: PROTONIX Take 20 mg by mouth See admin instructions. Take 1 tablet (20 mg) by mouth scheduled each morning, may take an additional dose in the evening if needed for heartburn/indigestion.   potassium chloride 10 MEQ tablet Commonly known as: K-DUR Take 2 tablets by mouth twice daily   temazepam 15 MG capsule Commonly known as: RESTORIL Take 1 capsule (15 mg total) by mouth at bedtime as needed for sleep.   torsemide 20 MG tablet Commonly known as: DEMADEX Take 2 tablets (40 mg total) by mouth daily.   triamcinolone ointment 0.1 % Commonly known as: KENALOG Apply 1 application topically 2 (two) times daily as needed (dry/irritated skin.). APPLY AS DIRECTED TO ITCHING SPOTS ON THE BODY DAILY TO TWICE DAILY AS NEEDED. NOT TO FACE.   UNABLE TO FIND Overnight Pulse Oximetry (to evaluate the need for continued nocturnal oxygen)  DX I27.20   Uptravi 1200 MCG Tabs Generic drug: Selexipag Take 1,200 mcg by mouth every 12 (twelve) hours.      Follow-up Information    Fayrene Helper, MD. Schedule an  appointment as soon as possible for a visit in 1 week(s).   Specialty: Family Medicine Contact information: [address], [address] [place] [address] [phone]        Constance Haw, MD .   Specialty: Cardiology Contact information: [address] [place] [address] [phone]        Larey Dresser, MD .   Specialty: Cardiology Contact information: [address] [place]  [address] [phone]          Allergies  Allergen Reactions  . Peanut Oil Swelling  . Potassium Iodide Other (See Comments)    facial swelling, also eyes, and ears  . Ace Inhibitors Cough  . Doxycycline Hives  . Peanut-Containing Drug Products Nausea And Vomiting  . Amlodipine Rash  . Codeine Nausea And Vomiting  . Latex Hives and Rash  . Penicillins Hives, Itching and Rash    .Marland KitchenHas patient had a PCN reaction causing immediate rash, facial/tongue/throat swelling, SOB or lightheadedness with hypotension: No Has patient had a  PCN reaction causing severe rash involving mucus membranes or skin necrosis: No Has patient had a PCN reaction that required hospitalization No Has patient had a PCN reaction occurring within the last 10 years: YES (3 years ago)  If all of the above answers are "NO", then may proceed with Cephalosporin use.     Consultations:  None.    Procedures/Studies: Dg Chest 2 View  Result Date: 03/28/2019 CLINICAL DATA:  Cough and shortness of breath. EXAM: CHEST - 2 VIEW COMPARISON:  Radiograph 08/19/2018 FINDINGS: The cardiomediastinal contours are unchanged with mild cardiomegaly. Mild vascular congestion without pulmonary edema. Streaky bibasilar atelectasis or scarring. No consolidation, pleural effusion, or pneumothorax. No acute osseous abnormalities are seen. IMPRESSION: 1. Mild cardiomegaly with vascular congestion. 2. Streaky bibasilar atelectasis or scarring. Electronically Signed   By: Keith Rake M.D.   On: 03/28/2019 01:21    Dg Chest Port 1 View  Result Date: 03/30/2019 CLINICAL DATA:  Pulmonary vasculature congestion, sob on exertion EXAM: PORTABLE CHEST 1 VIEW COMPARISON:  Chest radiographs dated 03/28/2019, 08/19/2018 FINDINGS: Stable cardiomediastinal contours with enlarged heart size. No pneumothorax or pleural effusion. Minimal central venous congestion. No new focal pulmonary infiltrate or evidence of edema. Upper abdomen and visualized skeleton is unremarkable. IMPRESSION: No active cardiopulmonary disease. Electronically Signed   By: Audie Pinto M.D.   On: 03/30/2019 13:36       Subjective: No chest pain or sob.   Discharge Exam: Vitals:   03/30/19 0837 03/30/19 1325  BP: 101/70 95/66  Pulse: 64 67  Resp:  18  Temp:  98.2 F (36.8 C)  SpO2:  92%   Vitals:   03/30/19 0613 03/30/19 0654 03/30/19 0837 03/30/19 1325  BP: 103/72  101/70 95/66  Pulse: 64  64 67  Resp: 18   18  Temp: 98.6 F (37 C)   98.2 F (36.8 C)  TempSrc: Oral   Oral  SpO2: 97%   92%  Weight:  88.7 kg      General: Pt is alert, awake, not in acute distress Cardiovascular: RRR, S1/S2 +, no rubs, no gallops Respiratory: CTA bilaterally, no wheezing, no rhonchi Abdominal: Soft, NT, ND, bowel sounds + Extremities: leg edem present , no cyanosis    The results of significant diagnostics from this hospitalization (including imaging, microbiology, ancillary and laboratory) are listed below for reference.     Microbiology: Recent Results (from the past 240 hour(s))  SARS CORONAVIRUS 2 Nasal Swab Aptima Multi Swab     Status: None   Collection Time: 03/28/19 12:15 AM   Specimen: Aptima Multi Swab; Nasal Swab  Result Value Ref Range Status   SARS Coronavirus 2 NEGATIVE NEGATIVE Final    Comment: (NOTE) SARS-CoV-2 target nucleic acids are NOT DETECTED. The SARS-CoV-2 RNA is generally detectable in upper and lower respiratory specimens during the acute phase of infection. Negative results do not preclude  SARS-CoV-2 infection, do not rule out co-infections with other pathogens, and should not be used as the sole basis for treatment or other patient management decisions. Negative results must be combined with clinical observations, patient history, and epidemiological information. The expected result is Negative. Fact Sheet for Patients: SugarRoll.be Fact Sheet for Healthcare Providers: https://www.woods-mathews.com/ This test is not yet approved or cleared by the [place] FDA and  has been authorized for detection and/or diagnosis of SARS-CoV-2 by FDA under an Emergency Use Authorization (EUA). This EUA will remain  in effect (meaning this test can be used) for the duration of  the COVID-19 declaration under Section 56 4(b)(1) of the Act, 21 U.S.C. section 360bbb-3(b)(1), unless the authorization is terminated or revoked sooner. Performed at [facility], [place] [address] [place], [place] [address]      Labs: BNP (last 3 results) Recent Labs    03/27/19 1638  BNP 858.8*   Basic Metabolic Panel: Recent Labs  Lab 03/27/19 1638  03/28/19 1713 03/29/19 0517 03/29/19 1714 03/30/19 0459 03/30/19 1447  NA 143   < > 142 142 141 142 140  K 2.8*   < > 3.8 3.9 4.3 3.3* 3.6  CL 101   < > 105 107 104 102 102  CO2 28   < > 24 24 21* 26 26  GLUCOSE 92   < > 118* 92 98 85 114*  BUN 34*   < > 32* 29* 32* 28* 27*  CREATININE 1.42*   < > 1.38* 1.22* 1.50* 1.38* 1.35*  CALCIUM 5.6*   < > 6.1* 6.9* 6.9* 6.9* 7.1*  MG 2.4  --   --   --   --   --   --   PHOS 7.1*  --   --  5.7*  --   --   --    < > = values in this interval not displayed.   Liver Function Tests: Recent Labs  Lab 03/27/19 1638 03/29/19 0517  AST 35  --   ALT 24  --   ALKPHOS 73  --   BILITOT 0.3  --   PROT 7.7  --   ALBUMIN 3.5 3.2*   No results for input(s): LIPASE, AMYLASE in the last 168 hours. No results for input(s): AMMONIA in the last 168  hours. CBC: Recent Labs  Lab 03/27/19 1638 03/28/19 0500 03/29/19 0517  WBC 11.4* 9.5 9.4  HGB 10.6* 9.5* 9.9*  HCT 34.7* 32.0* 33.8*  MCV 82.6 82.7 83.9  PLT 396 377 401*   Cardiac Enzymes: Recent Labs  Lab 03/27/19 1638  CKTOTAL 453*   BNP: Invalid input(s): POCBNP CBG: Recent Labs  Lab 03/28/19 0017 03/29/19 0024  GLUCAP 82 73   D-Dimer No results for input(s): DDIMER in the last 72 hours. Hgb A1c No results for input(s): HGBA1C in the last 72 hours. Lipid Profile No results for input(s): CHOL, HDL, LDLCALC, TRIG, CHOLHDL, LDLDIRECT in the last 72 hours. Thyroid function studies Recent Labs    03/30/19 0952  TSH 2.437   Anemia work up No results for input(s): VITAMINB12, FOLATE, FERRITIN, TIBC, IRON, RETICCTPCT in the last 72 hours. Urinalysis    Component Value Date/Time   COLORURINE YELLOW 07/30/2016 1837   APPEARANCEUR CLEAR 07/30/2016 1837   LABSPEC 1.020 07/30/2016 1837   PHURINE 7.0 07/30/2016 1837   GLUCOSEU NEGATIVE 07/30/2016 1837   HGBUR NEGATIVE 07/30/2016 1837   BILIRUBINUR NEGATIVE 07/30/2016 1837   KETONESUR NEGATIVE 07/30/2016 1837   PROTEINUR 100 (A) 07/30/2016 1837   UROBILINOGEN 0.2 10/13/2014 2352   NITRITE NEGATIVE 07/30/2016 1837   LEUKOCYTESUR NEGATIVE 07/30/2016 1837   Sepsis Labs Invalid input(s): PROCALCITONIN,  WBC,  LACTICIDVEN Microbiology Recent Results (from the past 240 hour(s))  SARS CORONAVIRUS 2 Nasal Swab Aptima Multi Swab     Status: None   Collection Time: 03/28/19 12:15 AM   Specimen: Aptima Multi Swab; Nasal Swab  Result Value Ref Range Status   SARS Coronavirus 2 NEGATIVE NEGATIVE Final    Comment: (NOTE) SARS-CoV-2 target nucleic acids are NOT DETECTED. The SARS-CoV-2 RNA is generally detectable in  upper and lower respiratory specimens during the acute phase of infection. Negative results do not preclude SARS-CoV-2 infection, do not rule out co-infections with other pathogens, and should not be used as  the sole basis for treatment or other patient management decisions. Negative results must be combined with clinical observations, patient history, and epidemiological information. The expected result is Negative. Fact Sheet for Patients: SugarRoll.be Fact Sheet for Healthcare Providers: https://www.woods-mathews.com/ This test is not yet approved or cleared by the [place] FDA and  has been authorized for detection and/or diagnosis of SARS-CoV-2 by FDA under an Emergency Use Authorization (EUA). This EUA will remain  in effect (meaning this test can be used) for the duration of the COVID-19 declaration under Section 56 4(b)(1) of the Act, 21 U.S.C. section 360bbb-3(b)(1), unless the authorization is terminated or revoked sooner. Performed at [facility], [place] [address] [place], [place] [address]      Time coordinating discharge: 32 minutes  SIGNED:   Hosie Poisson, MD  [facility] 03/30/2019, 8:15 PM Pager   If 7PM-7AM, please contact night-coverage [email or web address] Password [email or web address]

## 2019-03-30 NOTE — Progress Notes (Signed)
Discussed all discharge information with patient, including, medications, prescription and follow up. IV removed and all personal belonging gathered. Patient had no further question.

## 2019-03-31 ENCOUNTER — Telehealth: Payer: Self-pay

## 2019-03-31 LAB — VITAMIN D 25 HYDROXY (VIT D DEFICIENCY, FRACTURES): Vit D, 25-Hydroxy: 29.3 ng/mL — ABNORMAL LOW (ref 30.0–100.0)

## 2019-03-31 LAB — PARATHYROID HORMONE, INTACT (NO CA): PTH: 51 pg/mL (ref 15–65)

## 2019-03-31 LAB — CALCITRIOL (1,25 DI-OH VIT D): Vit D, 1,25-Dihydroxy: 29.2 pg/mL (ref 19.9–79.3)

## 2019-03-31 LAB — CALCIUM, IONIZED: Calcium, Ionized, Serum: 3.5 mg/dL — ABNORMAL LOW (ref 4.5–5.6)

## 2019-03-31 NOTE — Telephone Encounter (Signed)
Called and spoke with patient and changed her appointment to virtual visit. She expressed that she was not to happy about this but understood that she does not come into office for appointment and we will be calling her to do virtual visit tomorrow. Meds reviewed and mychart consent sent.

## 2019-03-31 NOTE — Telephone Encounter (Signed)
[id number]  Attempted to contact patient to complete TOC telephone call. No answer. Left message requesting call back. Will try again later. 1st attempt

## 2019-03-31 NOTE — Progress Notes (Signed)
Virtual Visit via Telephone  Note   This visit type was conducted due to national recommendations for restrictions regarding the COVID-19 Pandemic (e.g. social distancing) in an effort to limit this patient's exposure and mitigate transmission in our community.  Due to her co-morbid illnesses, this patient is at least at moderate risk for complications without adequate follow up.  This format is felt to be most appropriate for this patient at this time.  All issues noted in this document were discussed and addressed.  A limited physical exam was performed with this format.  Please refer to the patient's chart for her consent to telehealth for [facility].   Evaluation Performed:  Follow-up visit  This visit type was conducted due to national recommendations for restrictions regarding the COVID-19 Pandemic (e.g. social distancing).  This format is felt to be most appropriate for this patient at this time.  All issues noted in this document were discussed and addressed.  No physical exam was performed (except for noted visual exam findings with Video Visits).  Please refer to the patient's chart (MyChart message for video visits and phone note for telephone visits) for the patient's consent to telehealth for [facility].  Date:  04/01/2019   ID:  Sabrina [name], Sabrina [name] 09/28/1960, MRN [id number]  Patient Location:  Home  Provider location:   [place]  PCP:  Fayrene Helper, MD  Cardiologist:  Fransico Him, MD Electrophysiologist:  Constance Haw, MD   Chief Complaint:  CHF, HTN  History of Present Illness:    Sabrina [name] is a 58 y.o. female who presents via audio/video conferencing for a telehealth visit today.    Sabrina [name] is a 58 y.o. female with a hx of diastolic CHF, severe pulmonary HTN(PASP 114 mmHg by cath),morbid obesity, HTN, HL, GERD, DM2 and normal coronary arteries by cath. Echo 2017 demonstrated normal LV function, mild diastolic  dysfunction, findings consistent with RV pressure and volume overload, trivial MR, no mitral stenosis, mild TR, PASP 91 mmHg, trivial pericardial effusion.VQ scan was low probability for PE and cath showed normal LVEDP. Sleep study was negative and no valvular heart dz. 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 She is followed by Dr. Lamonte Sakai in Pulmonary.   She was dx with scleroderma with associated mild ILD and Raynauds and referred to AHF clinic where War 3/20 showed severe pulmonary HTN with preserved CO.  She has been on Ambrisentan and mycophenolate.  After her RHC she her ambrisentan was increased but did not tolerate the higher dose and is now on 5mg  daily.  She is also on Uptravi 1232mcg BID.  She was last seen by Dr. Aundra Dubin last month complaining of increased DOE with minimal distances and weight was up.  Her Torsemide was increased. She had also stopped taking BB as she did not tolerate it so Dr. Aundra Dubin started her on Bystolic.    She was just discharged from [facility] after admission for hypocalcemia, hypokalemia, low Vit D and low albumin complicated by pulmonary vascular congestion and DOE.  She was given a dose of IV Lasix with improved in sx electrolytes repleted.  She was started on Vit D as well   She was d/c'd home on Torsemide 40mg  daily.  Her QTc was also prolonged at 562msec.  She is here today for followup.  She tells me that she has not really noticed any difference in her SOB or LE  edema after Dr. Aundra Dubin placed her on Torsemide 20mg  BID and still is having DOE with minimal activity such as walking up stairs.  She has continued to have LE edema.  She does not think that she is urinating much with the torsemide.   She denies any chest pain or pressure, PND, orthopnea,  dizziness, palpitations or syncope. She is compliant with her meds and is tolerating meds with no SE.  She is currently not on home O2.    The  patient does not have symptoms concerning for COVID-19 infection (fever, chills, cough, or new shortness of breath).    Prior CV studies:   The following studies were reviewed today:  none  Past Medical History:  Diagnosis Date   Allergy    Anemia    Arthritis    Asthma    Chronic bronchitis (HCC)    Connective tissue disease ([place])    Diabetes mellitus    Endometriosis    Fibroid uterus    GERD (gastroesophageal reflux disease)    History of Doppler ultrasound    a. Carotid US 8/08:Estimated stenosis in the right and left internal carotid arteries is 0-50% and 0-50%.   History of echocardiogram    a. Echo 6/16: EF 60-65%, no RWMA.   History of hiatal hernia    History of nuclear stress test    a. Myoview 6/16: There is no resting or stress perfusion defect consistent with no prior infarct and no ischemia. EF 73 %  The patient was hypertensive prior and during the study. Cardiac cath 04/2016 showed normal coronary arteries   Hx of iron deficiency anemia    Hypersomnia with sleep apnea    Hypertension    Lupus ([place]) 07/2013   Of Skin   Menorrhagia    Obesity    Pneumonia    Prolonged QT syndrome 06/22/2016   Secondary hyperthyroidism    Thyroid disease    Tinnitus    left ear   Tubular adenoma of colon    Vertigo    Vitamin D deficiency    Past Surgical History:  Procedure Laterality Date   ABDOMINAL HYSTERECTOMY  2004   supracervical hysterectomy   BREAST SURGERY  2005   reduction   BREATH TEK H PYLORI [name] 12/24/2014   Procedure: [place];  Surgeon: Greer Pickerel, MD;  Location: [facility];  Service: General;  Laterality: [name];   CARDIAC CATHETERIZATION [name] 03/22/2016   Procedure: Right/Left Heart Cath and Coronary Angiography;  Surgeon: Troy Sine, MD;  Location: [place] [facility];  Service: Cardiovascular;  Laterality: [name];   DILATION AND CURETTAGE OF UTERUS     age 83   ESOPHAGOGASTRODUODENOSCOPY (EGD) WITH  PROPOFOL [name] 10/14/2015   Procedure: ESOPHAGOGASTRODUODENOSCOPY (EGD) WITH PROPOFOL;  Surgeon: Clarene Essex, MD;  Location: [facility];  Service: Endoscopy;  Laterality: [name];   LAPAROSCOPIC GASTRIC SLEEVE RESECTION WITH HIATAL HERNIA REPAIR [name] 07/05/2015   Procedure: LAPAROSCOPIC GASTRIC SLEEVE RESECTION WITH HIATAL HERNIA REPAIR, upper endoscopy;  Surgeon: Greer Pickerel, MD;  Location: [facility];  Service: General;  Laterality: [name];   REDUCTION MAMMAPLASTY Bilateral    RIGHT HEART CATH [name] 10/13/2018   Procedure: RIGHT HEART CATH;  Surgeon: Larey Dresser, MD;  Location: [place] [facility];  Service: Cardiovascular;  Laterality: [name];     Current Meds  Medication Sig   acetaminophen (TYLENOL) 500 MG tablet Take 500-1,000 mg by mouth every 6 (six) hours as needed for headache.    ambrisentan (LETAIRIS)  5 MG tablet Take 1 tablet (5 mg total) by mouth daily.   aspirin EC 81 MG tablet Take 1 tablet (81 mg total) by mouth daily.   bisoprolol (ZEBETA) 5 MG tablet Take 0.5 tablets (2.5 mg total) by mouth daily.   Calcium-Magnesium-Vitamin D [id number] MG-MG-UNIT TABS Take 2 tablets by mouth 2 (two) times daily.   levothyroxine (SYNTHROID) 75 MCG tablet TAKE 1 TABLET (75 MCG TOTAL) BY MOUTH DAILY BEFORE BREAKFAST.   Multiple Vitamin (MULTIVITAMIN WITH MINERALS) TABS tablet Take 1 tablet by mouth daily.   mycophenolate (MYFORTIC) 360 MG TBEC EC tablet Take 720 mg by mouth 2 (two) times daily.    nitroGLYCERIN (NITROSTAT) 0.4 MG SL tablet Place 0.4 mg under the tongue every 5 (five) minutes x 3 doses as needed for chest pain.    pantoprazole (PROTONIX) 20 MG tablet Take 20 mg by mouth See admin instructions. Take 1 tablet (20 mg) by mouth scheduled each morning, may take an additional dose in the evening if needed for heartburn/indigestion.   potassium chloride (K-DUR) 10 MEQ tablet Take 2 tablets by mouth twice daily   Selexipag (UPTRAVI) 1200 MCG TABS Take 1,200 mcg by mouth every 12 (twelve)  hours.   temazepam (RESTORIL) 15 MG capsule Take 1 capsule (15 mg total) by mouth at bedtime as needed for sleep.   torsemide (DEMADEX) 20 MG tablet Take 2 tablets (40 mg total) by mouth daily.   triamcinolone ointment (KENALOG) 0.1 % Apply 1 application topically 2 (two) times daily as needed (dry/irritated skin.). APPLY AS DIRECTED TO ITCHING SPOTS ON THE BODY DAILY TO TWICE DAILY AS NEEDED. NOT TO FACE.    UNABLE TO FIND Overnight Pulse Oximetry (to evaluate the need for continued nocturnal oxygen)  DX I27.20     Allergies:   Peanut oil, Potassium iodide, Ace inhibitors, Doxycycline, Peanut-containing drug products, Amlodipine, Codeine, Latex, and Penicillins   Social History   Tobacco Use   Smoking status: Never Smoker   Smokeless tobacco: Never Used  Substance Use Topics   Alcohol use: No    Alcohol/week: 0.0 standard drinks   Drug use: No     Family Hx: The patient's family history includes Arthritis in her father, mother, and sister; Bleeding Disorder in her sister; Breast cancer in her sister; CAD in her mother; Cancer in her father; Colon cancer in her maternal grandfather; Depression in her father, mother, and sister; Diabetes in her father and mother; Hypertension in her father, mother, and sister; Stroke in her father and maternal aunt; Thyroid disease in her sister. There is no history of Heart attack.  ROS:   Please see the history of present illness.     All other systems reviewed and are negative.   Labs/Other Tests and Data Reviewed:    Recent Labs: 03/27/2019: ALT 24; B Natriuretic Peptide 484.2; Magnesium 2.4 03/29/2019: Hemoglobin 9.9; Platelets 401 03/30/2019: BUN 27; Creatinine, Ser 1.35; Potassium 3.6; Sodium 140; TSH 2.437   Recent Lipid Panel Lab Results  Component Value Date/Time   CHOL 166 12/23/2017 07:38 AM   TRIG 109 12/23/2017 07:38 AM   HDL 35 (L) 12/23/2017 07:38 AM   CHOLHDL 4.7 12/23/2017 07:38 AM   LDLCALC 109 (H) 12/23/2017 07:38  AM    Wt Readings from Last 3 Encounters:  04/01/19 195 lb (88.5 kg)  03/30/19 195 lb 9.6 oz (88.7 kg)  03/12/19 198 lb 12.8 oz (90.2 kg)     Objective:    Vital Signs:  Ht 5' (1.524  m)    Wt 195 lb (88.5 kg)    LMP 10/31/2002    BMI 38.08 kg/m     ASSESSMENT & PLAN:    1.  Primary Pulmonary Hypertension - followed by AHF clinic.  [place] 3/20 showed preserved CO with severe Pulmonary HTN suspected to be Group 1 from scleroderma.  Prior V/Q scan neg for PE and no OSA on sleep study.  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  Last echo 1/20 showed moderate RV enlargement and decreased RVF.  Has been on ambrisentan 5mg  daily and did not tolerate higher dose due to edema.  Now on Uptravi 1244mcg BID, Ambrisentan 5mg  daily land diuretics.  Plan by Dr. Aundra Dubin was to increase diuretics and then consider addition of Adcirca after volume stabilized.  She has not noticed any increase in UOP on the higher dose of Torsemide 20mg  BID and still has SOB and LE edema.  Her SOB is fairly limiting at this time.  She is not on home O2.  Discussed with Dr. Aundra Dubin and will increase Torsemide to 40mg  BID and increase Kdur to 42meq BID (previously on 88meq BID) and check a BMET on Friday.  She will be worked into AHF clinic next week.  Would consider O2sat walk test at that time to see if she qualifies for home O2.  2.  Scleroderma - followed at [facility] and on Mycophenolate.  This is complicated by ILD and pulmonary HTN.   3,  Interstitial lung dz - followed by Pulmonary with mild PFT abnormality c/w restriction and imaging c/w ILD related to scleroderma.  4.  PFO - this was noted on prior echo bubble study.   5.  Long QT syndrome - she was noted to have a long QT on EKG in the past.  Seen by Dr. Curt Bears with EP and started on BB but did not tolerate.  Recently started on low dose Bystolic by Dr. Aundra Dubin and is tolerating it well.  Recommend repeat EKG in office with AHF clinic next week since her  QT was prolonged at recent hospitalization.  6.  Chronic diastolic CHF - she had an exacerbation of CHF recently in the setting of severe hypokalemia dn hypocalcemia in the setting of low Vit D. Her electrolytes were repleated.    COVID-19 Education: The signs and symptoms of COVID-19 were discussed with the patient and how to seek care for testing (follow up with PCP or arrange E-visit).  The importance of social distancing was discussed today.  Patient Risk:   After full review of this patient's clinical status, I feel that they are at least moderate risk at this time.  Time:   Today, I have spent 25 minutes directly with the patient on telemedicine discussing medical problems including Pulmonary HTN, Scleroderma, long QT.  We also reviewed the symptoms of COVID 19 and the ways to protect against contracting the virus with telehealth technology.  I spent an additional 5 minutes reviewing patient's chart including office notes, echo.  Medication Adjustments/Labs and Tests Ordered: Current medicines are reviewed at length with the patient today.  Concerns regarding medicines are outlined above.  Tests Ordered: No orders of the defined types were placed in this encounter.  Medication Changes: No orders of the defined types were placed in this encounter.   Disposition:  Follow up 6 months virtual visit with me  Signed, Fransico Him, MD  04/01/2019 9:40 AM    [place]

## 2019-04-01 ENCOUNTER — Encounter: Payer: Self-pay | Admitting: "Endocrinology

## 2019-04-01 ENCOUNTER — Telehealth (INDEPENDENT_AMBULATORY_CARE_PROVIDER_SITE_OTHER): Payer: 59 | Admitting: Cardiology

## 2019-04-01 ENCOUNTER — Other Ambulatory Visit: Payer: Self-pay | Admitting: *Deleted

## 2019-04-01 ENCOUNTER — Other Ambulatory Visit: Payer: Self-pay

## 2019-04-01 ENCOUNTER — Ambulatory Visit: Payer: 59 | Admitting: "Endocrinology

## 2019-04-01 ENCOUNTER — Encounter: Payer: Self-pay | Admitting: Cardiology

## 2019-04-01 VITALS — Ht 60.0 in | Wt 195.0 lb

## 2019-04-01 DIAGNOSIS — E876 Hypokalemia: Secondary | ICD-10-CM | POA: Diagnosis not present

## 2019-04-01 DIAGNOSIS — E039 Hypothyroidism, unspecified: Secondary | ICD-10-CM

## 2019-04-01 DIAGNOSIS — J849 Interstitial pulmonary disease, unspecified: Secondary | ICD-10-CM

## 2019-04-01 DIAGNOSIS — I5032 Chronic diastolic (congestive) heart failure: Secondary | ICD-10-CM

## 2019-04-01 DIAGNOSIS — Z79899 Other long term (current) drug therapy: Secondary | ICD-10-CM

## 2019-04-01 DIAGNOSIS — I4581 Long QT syndrome: Secondary | ICD-10-CM

## 2019-04-01 DIAGNOSIS — M349 Systemic sclerosis, unspecified: Secondary | ICD-10-CM

## 2019-04-01 DIAGNOSIS — I10 Essential (primary) hypertension: Secondary | ICD-10-CM

## 2019-04-01 DIAGNOSIS — I2721 Secondary pulmonary arterial hypertension: Secondary | ICD-10-CM

## 2019-04-01 MED ORDER — TORSEMIDE 20 MG PO TABS: 40.0000 mg | ORAL_TABLET | Freq: Two times a day (BID) | ORAL | 3 refills | Status: DC

## 2019-04-01 MED ORDER — POTASSIUM CHLORIDE CRYS ER 10 MEQ PO TBCR: 40.0000 meq | EXTENDED_RELEASE_TABLET | Freq: Two times a day (BID) | ORAL | 3 refills | Status: DC

## 2019-04-01 NOTE — Progress Notes (Signed)
She needs followup in CHF clinic soon with me.

## 2019-04-01 NOTE — Telephone Encounter (Signed)
[id number]  Attempted to contact patient to complete TOC telephone call. No answer. Left message requesting call back. Will try again later. 2nd attempt

## 2019-04-01 NOTE — Patient Instructions (Signed)
Medication Instructions:  Please increase Torsemide to 40 mg by mouth twice a day.  Increase Potassium Chloride 10 MEQ (4) by mouth twice a day.  Continue all other medications as listed.  If you need a refill on your cardiac medications before your next appointment, please call your pharmacy.   Lab work: Please have blood work on Friday.  (BMP) If you have labs (blood work) drawn today and your tests are completely normal, you will receive your results only by: [name] MyChart Message (if you have MyChart) OR . A paper copy in the mail If you have any lab test that is abnormal or we need to change your treatment, we will call you to review the results.  Follow-Up: You will be contacted by the [facility] - Dr Claris Gladden office to be scheduled to see him next week.  At [facility], you and your health needs are our priority.  As part of our continuing mission to provide you with exceptional heart care, we have created designated Provider Care Teams.  These Care Teams include your primary Cardiologist (physician) and Advanced Practice Providers (APPs -  Physician Assistants and Nurse Practitioners) who all work together to provide you with the care you need, when you need it. You will need a follow up appointment in 6 months.  Please call our office 2 months in advance to schedule this appointment.  You may see Dr Radford Pax or one of the following Advanced Practice Providers on your designated Care Team:   Lyda Jester, PA-C Melina Copa, PA-C . Ermalinda Barrios, PA-C  Thank you for choosing [facility]!!

## 2019-04-01 NOTE — Progress Notes (Signed)
04/01/2019             Endocrinology follow-up note                      Subjective:    Patient ID: Sabrina [name], female    DOB: 1960/11/23, PCP Fayrene Helper, MD   Past Medical History:  Diagnosis Date  . Allergy   . Anemia   . Arthritis   . Asthma   . Chronic bronchitis ([place])   . Connective tissue disease ([place])   . Diabetes mellitus   . Endometriosis   . Fibroid uterus   . GERD (gastroesophageal reflux disease)   . History of Doppler ultrasound    a. Carotid US 8/08:Estimated stenosis in the right and left internal carotid arteries is 0-50% and 0-50%.  . History of echocardiogram    a. Echo 6/16: EF 60-65%, no RWMA.  . History of hiatal hernia   . History of nuclear stress test    a. Myoview 6/16: There is no resting or stress perfusion defect consistent with no prior infarct and no ischemia. EF 73 %  The patient was hypertensive prior and during the study. Cardiac cath 04/2016 showed normal coronary arteries  . Hx of iron deficiency anemia   . Hypersomnia with sleep apnea   . Hypertension   . Lupus ([place]) 07/2013   Of Skin  . Menorrhagia   . Obesity   . Pneumonia   . 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 Secondary hyperthyroidism   . Thyroid disease   . Tinnitus    left ear  . Tubular adenoma of colon   . Vertigo   . Vitamin D deficiency    Past Surgical History:  Procedure Laterality Date  . ABDOMINAL HYSTERECTOMY  2004   supracervical hysterectomy  . BREAST SURGERY  2005   reduction  . BREATH TEK H PYLORI [name] 12/24/2014   Procedure: BREATH TEK H PYLORI;  Surgeon: Greer Pickerel, MD;  Location: [facility];  Service: General;  Laterality: [name];  . CARDIAC CATHETERIZATION [name] 03/22/2016   Procedure: Right/Left Heart Cath and Coronary Angiography;  Surgeon: Troy Sine, MD;  Location: [facility];  Service: Cardiovascular;  Laterality: [name];  . DILATION AND CURETTAGE OF UTERUS     age 40  . ESOPHAGOGASTRODUODENOSCOPY (EGD) WITH PROPOFOL [name]  10/14/2015   Procedure: ESOPHAGOGASTRODUODENOSCOPY (EGD) WITH PROPOFOL;  Surgeon: Clarene Essex, MD;  Location: [facility];  Service: Endoscopy;  Laterality: [name];  . LAPAROSCOPIC GASTRIC SLEEVE RESECTION WITH HIATAL HERNIA REPAIR [name] 07/05/2015   Procedure: LAPAROSCOPIC GASTRIC SLEEVE RESECTION WITH HIATAL HERNIA REPAIR, upper endoscopy;  Surgeon: Greer Pickerel, MD;  Location: [facility];  Service: General;  Laterality: [name];  . REDUCTION MAMMAPLASTY Bilateral   . RIGHT HEART CATH [name] 10/13/2018   Procedure: RIGHT HEART CATH;  Surgeon: Larey Dresser, MD;  Location: [facility];  Service: Cardiovascular;  Laterality: [name];   Social History   Socioeconomic History  . Marital status: Married    Spouse name: Not on file  . Number of children: Not on file  . Years of education: 31  . Highest education level: Not on file  Occupational History  . Occupation: Therapist, music: Radio broadcast assistant  Social Needs  . Financial resource strain: Not on file  . Food insecurity    Worry: Not on file    Inability: Not on file  . Transportation needs    Medical:  Not on file    Non-medical: Not on file  Tobacco Use  . Smoking status: Never Smoker  . Smokeless tobacco: Never Used  Substance and Sexual Activity  . Alcohol use: No    Alcohol/week: 0.0 standard drinks  . Drug use: No  . Sexual activity: Yes    Partners: Male    Birth control/protection: Surgical    Comment: hysterectomy (supracervical)  Lifestyle  . Physical activity    Days per week: Not on file    Minutes per session: Not on file  . Stress: Not on file  Relationships  . Social Herbalist on phone: Not on file    Gets together: Not on file    Attends religious service: Not on file    Active member of club or organization: Not on file    Attends meetings of clubs or organizations: Not on file    Relationship status: Not on file  Other Topics Concern  . Not on file  Social History Narrative    Regular exercise-no   Caffeine Use-no      [facility]: Patient lives with her husband. Works for [employer]. No bird or mold exposure. No pets currently.         Outpatient Encounter Medications as of 04/01/2019  Medication Sig  . Calcium Carb-Cholecalciferol (CALCIUM + D3) 600-200 MG-UNIT TABS Take 1 tablet by mouth 2 (two) times daily with a meal.  . Magnesium Oxide 250 MG TABS Take 1 tablet by mouth daily with breakfast.  . acetaminophen (TYLENOL) 500 MG tablet Take 500-1,000 mg by mouth every 6 (six) hours as needed for headache.   [name] ambrisentan (LETAIRIS) 5 MG tablet Take 1 tablet (5 mg total) by mouth daily.  [name] aspirin EC 81 MG tablet Take 1 tablet (81 mg total) by mouth daily.  . bisoprolol (ZEBETA) 5 MG tablet Take 0.5 tablets (2.5 mg total) by mouth daily.  [name] levothyroxine (SYNTHROID) 75 MCG tablet TAKE 1 TABLET (75 MCG TOTAL) BY MOUTH DAILY BEFORE BREAKFAST.  . Multiple Vitamin (MULTIVITAMIN WITH MINERALS) TABS tablet Take 1 tablet by mouth daily.  . mycophenolate (MYFORTIC) 360 MG TBEC EC tablet Take 720 mg by mouth 2 (two) times daily.   . nitroGLYCERIN (NITROSTAT) 0.4 MG SL tablet Place 0.4 mg under the tongue every 5 (five) minutes x 3 doses as needed for chest pain.   . pantoprazole (PROTONIX) 20 MG tablet Take 20 mg by mouth See admin instructions. Take 1 tablet (20 mg) by mouth scheduled each morning, may take an additional dose in the evening if needed for heartburn/indigestion.  . potassium chloride (K-DUR) 10 MEQ tablet Take 4 tablets (40 mEq total) by mouth 2 (two) times daily.  . Selexipag (UPTRAVI) 1200 MCG TABS Take 1,200 mcg by mouth every 12 (twelve) hours.  . temazepam (RESTORIL) 15 MG capsule Take 1 capsule (15 mg total) by mouth at bedtime as needed for sleep.  [name] torsemide (DEMADEX) 20 MG tablet Take 2 tablets (40 mg total) by mouth 2 (two) times daily.  [name] triamcinolone ointment (KENALOG) 0.1 % Apply 1 application topically 2 (two) times daily  as needed (dry/irritated skin.). APPLY AS DIRECTED TO ITCHING SPOTS ON THE BODY DAILY TO TWICE DAILY AS NEEDED. NOT TO FACE.   [name] UNABLE TO FIND Overnight Pulse Oximetry (to evaluate the need for continued nocturnal oxygen)  DX I27.20  . [DISCONTINUED] Calcium-Magnesium-Vitamin D [phone] MG-MG-UNIT TABS Take 2 tablets by mouth 2 (two) times daily.  . [  DISCONTINUED] potassium chloride (K-DUR) 10 MEQ tablet Take 2 tablets by mouth twice daily   No facility-administered encounter medications on file as of 04/01/2019.    ALLERGIES: Allergies  Allergen Reactions  . Peanut Oil Swelling  . Potassium Iodide Other (See Comments)    facial swelling, also eyes, and ears  . Ace Inhibitors Cough  . Doxycycline Hives  . Peanut-Containing Drug Products Nausea And Vomiting  . Amlodipine Rash  . Codeine Nausea And Vomiting  . Latex Hives and Rash  . Penicillins Hives, Itching and Rash    .Marland KitchenHas patient had a PCN reaction causing immediate rash, facial/tongue/throat swelling, SOB or lightheadedness with hypotension: No Has patient had a PCN reaction causing severe rash involving mucus membranes or skin necrosis: No Has patient had a PCN reaction that required hospitalization No Has patient had a PCN reaction occurring within the last 10 years: YES (3 years ago)  If all of the above answers are "NO", then may proceed with Cephalosporin use.     VACCINATION STATUS: Immunization History  Administered Date(s) Administered  . Influenza Split 06/22/2011, 05/13/2018  . Influenza Whole 07/11/2006  . Influenza,inj,Quad PF,6+ Mos 06/26/2013, 05/11/2014, 06/13/2015, 05/20/2017  . Influenza-Unspecified 06/26/2013, 05/11/2014, 06/13/2015, 06/06/2016, 05/20/2017  . Pneumococcal Conjugate-13 02/07/2015  . Pneumococcal Polysaccharide-23 06/26/2013  . Td 04/11/2004  . Tdap 05/09/2015    HPI   58 year old female patient with medical history as above. -She is returning to follow-up for her history of  hypoglycemia and hypothyroidism.  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  She was supposed to be on a regular supplement with calcium, magnesium, and vitamin D.    During her last encounter, her calcium was 6.5 dropping from 7.9 due to treatment was dropped.    She was advised to go to the pharmacy and resume her supplement, which unfortunately she failed to do.  She was found to have profound hypoglycemia at 5.6 which required inpatient treatment with subsequent improvement to 7.1.  She is currently back on her supplements including calcium-D  600-200 milligrams p.o. twice daily, magnesium oxide 250 mg p.o. daily, and potassium 40 mEq p.o. daily with correction to target range.  She did have symptoms including tingling and muscle cramps that has now resolved. -She had scleroderma, on various modalities of treatment.   -She has history of multinodular goiter with biopsy of one of her nodules consistent with hyperblastic nodule.  Repeat thyroid ultrasound on March 13, 2017 was remarkable for another left lobe nodule 1.5 cm with benign features. She did not show up for her appointment for surveillance thyroid ultrasound.  -She is on levothyroxine 75 mcg for hypothyroidism, reports compliance with this medication -She denies dysphagia, shortness of breath, voice change. - She has a history of prolonged QT interval.    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  Neurological: no tremors, no numbness, no tingling, no dizziness Psychiatric: no depression, no anxiety  Objective:    BP 115/80   Pulse 84   Ht 4\' 11"  (1.499 m)   Wt 194 lb (88 kg)   LMP 10/31/2002   BMI 39.18 kg/m   Wt Readings from Last 3 Encounters:  04/01/19 194 lb (88 kg)  04/01/19 195 lb (88.5 kg)  03/30/19 195 lb 9.6 oz (88.7 kg)     CMP     Component Value Date/Time   NA 140 03/30/2019 1447   NA 142 09/30/2018 1409   K 3.6 03/30/2019  1447   CL 102 03/30/2019 1447    CO2 26 03/30/2019 1447   GLUCOSE 114 (H) 03/30/2019 1447   BUN 27 (H) 03/30/2019 1447   BUN 23 09/30/2018 1409   CREATININE 1.35 (H) 03/30/2019 1447   CREATININE 1.14 (H) 12/09/2018 1019   CALCIUM 7.1 (L) 03/30/2019 1447   CALCIUM 7.8 (L) 12/02/2008 2239   PROT 7.7 03/27/2019 1638   ALBUMIN 3.2 (L) 03/29/2019 0517   AST 35 03/27/2019 1638   ALT 24 03/27/2019 1638   ALKPHOS 73 03/27/2019 1638   BILITOT 0.3 03/27/2019 1638   GFRNONAA 44 (L) 03/30/2019 1447   GFRNONAA 62 05/09/2018 0837   GFRAA 51 (L) 03/30/2019 1447   GFRAA 71 05/09/2018 0837     Diabetic Labs (most recent): Lab Results  Component Value Date   HGBA1C 5.7 (H) 12/23/2017   HGBA1C 5.3 12/11/2016   HGBA1C 6.3 (H) 03/10/2016     Lipid Panel ( most recent) Lipid Panel     Component Value Date/Time   CHOL 166 12/23/2017 0738   TRIG 109 12/23/2017 0738   HDL 35 (L) 12/23/2017 0738   CHOLHDL 4.7 12/23/2017 0738   VLDL 18 12/11/2016 0743   LDLCALC 109 (H) 12/23/2017 0738     Assessment & Plan:   1. 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  Not due to hypoparathyroidism.  She has elevated PTH appropriately in response to hypocalcemia.  -For various reasons, she has not been consistent taking her supplements.  She is now back on her supplements with reasonable correction of calcium, potassium towards target range.   -She will continue on calcium carbonate-cholecalciferol 600/200 mg p.o. twice daily with meals, magnesium oxide 250 mg p.o. daily at breakfast.    -She is advised to continue potassium 40 mg p.o. daily with plan to repeat CMP in 4 weeks.  2. hypothyroidism -Her recent thyroid function tests are consistent with appropriate replacement.   She is advised to continue levothyroxine 75 mcg p.o. daily before breakfast.  - We discussed about the correct intake of her thyroid hormone, on empty stomach at fasting, with water, separated by at least  30 minutes from breakfast and other medications,  and separated by more than 4 hours from calcium, iron, multivitamins, acid reflux medications (PPIs). -Patient is made aware of the fact that thyroid hormone replacement is needed for life, dose to be adjusted by periodic monitoring of thyroid function tests.   3.  Nodular goiter -Her thyroid ultrasound from August 2018 was remarkable for stable isthmus nodule at 1.8 cm and new 1.5 cm nodule on the left lobe with benign and non- suspicious features.  She missed her appointment for  surveillance thyroid/neck ultrasound.  She will be reconsidered for surveillance thyroid ultrasound after her next visit. -She will not require intervention at this time.  4.  Type 2 diabetes-controlled with A1c of 5.7%.  Not on medications currently. -She will have repeat A1c on subsequent visits.    - I advised patient to maintain close follow up with Fayrene Helper, MD for primary care needs.   Time for this visit: 15 minutes. Maite Burlison Steffensmeier  participated in the discussions, expressed understanding, and voiced agreement with the above plans.  All questions were answered to her satisfaction. she is encouraged to contact clinic should she have any questions or concerns prior to her return visit.   Follow up plan: Return in about 5 weeks (around 05/06/2019) for Follow up  with Pre-visit Labs.  Glade Lloyd, MD Phone: [phone]  Fax: [phone]   04/01/2019, 4:47 PM

## 2019-04-01 NOTE — Addendum Note (Signed)
Addended by: Shellia Cleverly on: 04/01/2019 12:10 PM   Modules accepted: Orders

## 2019-04-02 ENCOUNTER — Other Ambulatory Visit (HOSPITAL_COMMUNITY): Payer: Self-pay | Admitting: Cardiology

## 2019-04-02 NOTE — Telephone Encounter (Signed)
Transition Care Management Follow-up Telephone Call   Date discharged?  03/30/2019              How have you been since you were released from the hospital? still gives out of breath if doing a lot but other than that feeling ok   Do you understand why you were in the hospital? Calcium and vitamin d bottomed out   Do you understand the discharge instructions? yes   Where were you discharged to? home    Items Reviewed:  Medications reviewed: yes  Allergies reviewed: yes  Dietary changes reviewed: yes  Referrals reviewed: yes   Functional Questionnaire:   Activities of Daily Living (ADLs):  yes    Any transportation issues/concerns?: no   Any patient concerns? no   Confirmed importance and date/time of follow-up visits scheduled 04/10/2019 at 8:20     Confirmed with patient if condition begins to worsen call PCP or go to the ER.  Patient was given the office number and encouraged to call back with question or concerns.  yes with verbal understanding

## 2019-04-09 ENCOUNTER — Encounter (HOSPITAL_COMMUNITY): Payer: Self-pay | Admitting: Cardiology

## 2019-04-09 ENCOUNTER — Other Ambulatory Visit: Payer: Self-pay

## 2019-04-09 ENCOUNTER — Ambulatory Visit (HOSPITAL_COMMUNITY)
Admission: RE | Admit: 2019-04-09 | Discharge: 2019-04-09 | Disposition: A | Discharge status: Home / Self Care | Payer: 59 | Source: Ambulatory Visit | Attending: Cardiology | Admitting: Cardiology

## 2019-04-09 VITALS — BP 118/70 | HR 69 | Wt 189.0 lb

## 2019-04-09 DIAGNOSIS — Q211 Atrial septal defect: Secondary | ICD-10-CM | POA: Insufficient documentation

## 2019-04-09 DIAGNOSIS — Z8249 Family history of ischemic heart disease and other diseases of the circulatory system: Secondary | ICD-10-CM | POA: Diagnosis not present

## 2019-04-09 DIAGNOSIS — J849 Interstitial pulmonary disease, unspecified: Secondary | ICD-10-CM

## 2019-04-09 DIAGNOSIS — E669 Obesity, unspecified: Secondary | ICD-10-CM | POA: Diagnosis not present

## 2019-04-09 DIAGNOSIS — Z79890 Hormone replacement therapy (postmenopausal): Secondary | ICD-10-CM | POA: Diagnosis not present

## 2019-04-09 DIAGNOSIS — R0601 Orthopnea: Secondary | ICD-10-CM | POA: Insufficient documentation

## 2019-04-09 DIAGNOSIS — Z7982 Long term (current) use of aspirin: Secondary | ICD-10-CM | POA: Insufficient documentation

## 2019-04-09 DIAGNOSIS — Z885 Allergy status to narcotic agent status: Secondary | ICD-10-CM | POA: Insufficient documentation

## 2019-04-09 DIAGNOSIS — M349 Systemic sclerosis, unspecified: Secondary | ICD-10-CM | POA: Insufficient documentation

## 2019-04-09 DIAGNOSIS — Z79899 Other long term (current) drug therapy: Secondary | ICD-10-CM | POA: Insufficient documentation

## 2019-04-09 DIAGNOSIS — Z881 Allergy status to other antibiotic agents status: Secondary | ICD-10-CM | POA: Diagnosis not present

## 2019-04-09 DIAGNOSIS — Z888 Allergy status to other drugs, medicaments and biological substances status: Secondary | ICD-10-CM | POA: Diagnosis not present

## 2019-04-09 DIAGNOSIS — I5032 Chronic diastolic (congestive) heart failure: Secondary | ICD-10-CM | POA: Diagnosis not present

## 2019-04-09 DIAGNOSIS — Z880 Allergy status to penicillin: Secondary | ICD-10-CM | POA: Diagnosis not present

## 2019-04-09 DIAGNOSIS — I110 Hypertensive heart disease with heart failure: Secondary | ICD-10-CM | POA: Insufficient documentation

## 2019-04-09 DIAGNOSIS — I2721 Secondary pulmonary arterial hypertension: Secondary | ICD-10-CM | POA: Insufficient documentation

## 2019-04-09 DIAGNOSIS — I2510 Atherosclerotic heart disease of native coronary artery without angina pectoris: Secondary | ICD-10-CM | POA: Diagnosis not present

## 2019-04-09 DIAGNOSIS — I7300 Raynaud's syndrome without gangrene: Secondary | ICD-10-CM | POA: Insufficient documentation

## 2019-04-09 DIAGNOSIS — E039 Hypothyroidism, unspecified: Secondary | ICD-10-CM | POA: Diagnosis not present

## 2019-04-09 DIAGNOSIS — Z6838 Body mass index (BMI) 38.0-38.9, adult: Secondary | ICD-10-CM | POA: Insufficient documentation

## 2019-04-09 LAB — BASIC METABOLIC PANEL
Anion gap: 18 — ABNORMAL HIGH (ref 5–15)
BUN: 36 mg/dL — ABNORMAL HIGH (ref 6–20)
CO2: 25 mmol/L (ref 22–32)
Calcium: 6.2 mg/dL — CL (ref 8.9–10.3)
Chloride: 97 mmol/L — ABNORMAL LOW (ref 98–111)
Creatinine, Ser: 1.73 mg/dL — ABNORMAL HIGH (ref 0.44–1.00)
GFR calc Af Amer: 38 mL/min — ABNORMAL LOW (ref >60)
GFR calc non Af Amer: 33 mL/min — ABNORMAL LOW (ref >60)
Glucose, Bld: 111 mg/dL — ABNORMAL HIGH (ref 70–99)
Potassium: 3.6 mmol/L (ref 3.5–5.1)
Sodium: 140 mmol/L (ref 135–145)

## 2019-04-09 LAB — BRAIN NATRIURETIC PEPTIDE: B Natriuretic Peptide: 494.5 pg/mL — ABNORMAL HIGH (ref 0.0–100.0)

## 2019-04-09 MED ORDER — TORSEMIDE 20 MG PO TABS: ORAL_TABLET | ORAL | 3 refills | Status: DC

## 2019-04-09 MED ORDER — POTASSIUM CHLORIDE CRYS ER 10 MEQ PO TBCR: EXTENDED_RELEASE_TABLET | ORAL | 3 refills | Status: DC

## 2019-04-09 NOTE — Addendum Note (Signed)
Encounter addended by: Larey Dresser, MD on: 04/09/2019 11:34 PM  Actions taken: Clinical Note Signed, Level of Service modified

## 2019-04-09 NOTE — Progress Notes (Addendum)
Date:  04/09/2019   ID:  Shiffy, Smigielski 01-12-1961, MRN [id number]   Provider location: [facility] Type of Visit: Established patient  PCP:  Fayrene Helper, MD  Cardiologist:  Dr. Radford Pax HF Cardiology: Dr. Aundra Dubin   History of Present Illness: Sabrina [name] is a 58 y.o. female who has a history of scleroderma with associated mild ILD, Raynauds, and pulmonary hypertension.  She was referred by Dr. Radford Pax and had Jenkins in 3/20 that showed severe pulmonary arterial hypertension with preserved cardiac output.  She has had the scleroderma diagnosis for > 10 years.  She is on mycophenolate.  Pulmonary hypertension is not a new diagnosis, she has been on ambrisentan.  She is followed by rheumatology at [facility]    After RHC, I had her increase her ambrisentan to 10 mg daily.    She did not tolerate increase in ambrisentan well, developed peripheral edema and weight went up.  I had her decrease the ambrisentan back to 5 mg daily and edema resolved.  She is now on Uptravi, dose has been increased up to 1200 mcg bid. She has not been able to tolerate higher dose.   She was admitted in 8/20 with hypocalcemia, dyspnea/CHF exacerbation.    CT chest in 8/20 showed stable interstitial lung disease, coronary artery calcification.   Patient returns for followup of pulmonary hypertension and RV failure.  She is still short of breath, especially walking up stairs or inclines.  Significant fatigue.  Dyspnea after walking 100 feet.  She has orthopnea, occasional PND.  No chest pain.  She has orthostatic symptoms at times but no syncope.  BP is stable.   Labs (3/20): K 3.8, creatinine 1.64 Labs (4/20): TSH and free T4 normal, K 3.8, creatinine 1.14 Labs (8/20): K 3.6, creatinine 1.35  ECG (personally reviewed): NSR, diffuse inferior and anterior TWIs, QTc 564.   6 minute walk (7/20): 213 m  PMH: 1. Pulmonary hypertension: Thought to be related to  scleroderma.   - V/Q scan (2017): No evidence for chronic PE.  - Sleep study (2017): Negative - Echo (1/20, [facility]): EF > 55%, moderately dilated RV with moderately decreased systolic function, moderate TR, positive bubble study.  - RHC (3/20): mean RA 11, PA 128/35 mean 69, mean PCWP 15, CI 2.32, PVR 12.6 WU.  - Cannot tolerate 10 mg ambrisentan due to edema.  2. Suspect PFO: positive bubble study.  3. HTN 4. Obese 5. Scleroderma: +ANA, +anti-centromere antibody.  Diagnosis x 10 years.  Has associated Raynauds, mild ILD, and severe PAH.  6. Long QT syndrome: Has been on beta blocker.  7. 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  - PFTs (4/19): FVC 63%, FEV1 70%, ratio 109%, TLC 67%, DLCO 60% - CT chest (8/20): Stable interstitial lung disease, coronary artery calcification.  8. Hypothyroidism 9. PFO 10. Hypocalcemia: Etiology is not clear.     Current Outpatient Medications  Medication Sig Dispense Refill   acetaminophen (TYLENOL) 500 MG tablet Take 500-1,000 mg by mouth every 6 (six) hours as needed for headache.      ambrisentan (LETAIRIS) 5 MG tablet Take 1 tablet (5 mg total) by mouth daily. 90 tablet 3   aspirin EC 81 MG tablet Take 1 tablet (81 mg total) by mouth daily.     bisoprolol (ZEBETA) 5 MG tablet Take 0.5 tablets (2.5 mg total) by mouth daily. 45 tablet 3   Calcium Carb-Cholecalciferol (CALCIUM + D3) 600-200 MG-UNIT TABS  Take 1 tablet by mouth 2 (two) times daily with a meal.     levothyroxine (SYNTHROID) 75 MCG tablet TAKE 1 TABLET (75 MCG TOTAL) BY MOUTH DAILY BEFORE BREAKFAST. 90 tablet 2   Magnesium Oxide 250 MG TABS Take 1 tablet by mouth daily with breakfast.     Multiple Vitamin (MULTIVITAMIN WITH MINERALS) TABS tablet Take 1 tablet by mouth daily.     mycophenolate (MYFORTIC) 360 MG TBEC EC tablet Take 720 mg by mouth 2 (two) times daily.      nitroGLYCERIN (NITROSTAT) 0.4 MG SL tablet Place 0.4 mg under the tongue every 5 (five) minutes x  3 doses as needed for chest pain.      pantoprazole (PROTONIX) 20 MG tablet Take 20 mg by mouth See admin instructions. Take 1 tablet (20 mg) by mouth scheduled each morning, may take an additional dose in the evening if needed for heartburn/indigestion.     potassium chloride (K-DUR) 10 MEQ tablet Take 6 tablets (60 mEq total) by mouth every morning AND 4 tablets (40 mEq total) every evening. 300 tablet 3   Selexipag (UPTRAVI) 1200 MCG TABS Take 1,200 mcg by mouth every 12 (twelve) hours. 60 tablet 11   temazepam (RESTORIL) 15 MG capsule Take 1 capsule (15 mg total) by mouth at bedtime as needed for sleep. 30 capsule 4   torsemide (DEMADEX) 20 MG tablet 80 mg in the AM and 40 mg in the PM for 5 days, then 60 mg in the AM and 40 mg in the PM thereafter 360 tablet 3   triamcinolone ointment (KENALOG) 0.1 % Apply 1 application topically 2 (two) times daily as needed (dry/irritated skin.). APPLY AS DIRECTED TO ITCHING SPOTS ON THE BODY DAILY TO TWICE DAILY AS NEEDED. NOT TO FACE.      UNABLE TO FIND Overnight Pulse Oximetry (to evaluate the need for continued nocturnal oxygen)  DX I27.20 1 each 0   No current facility-administered medications for this encounter.     Allergies:   Peanut oil, Potassium iodide, Ace inhibitors, Doxycycline, Peanut-containing drug products, Amlodipine, Codeine, Latex, and Penicillins   Social History:  The patient  reports that she has never smoked. She has never used smokeless tobacco. She reports that she does not drink alcohol or use drugs.   Family History:  The patient's family history includes Arthritis in her father, mother, and sister; Bleeding Disorder in her sister; Breast cancer in her sister; CAD in her mother; Cancer in her father; Colon cancer in her maternal grandfather; Depression in her father, mother, and sister; Diabetes in her father and mother; Hypertension in her father, mother, and sister; Stroke in her father and maternal aunt; Thyroid disease  in her sister.   ROS:  Please see the history of present illness.   All other systems are personally reviewed and negative.   Exam:   BP 118/70    Pulse 69    Wt 85.7 kg (189 lb)    LMP 10/31/2002    SpO2 96%    BMI 38.17 kg/m  General: NAD Neck: JVP 14 cm, no thyromegaly or thyroid nodule.  Lungs: Slight crackles at bases. . CV: Nondisplaced PMI.  Heart regular S1/S2 with widely split S2, no S3/S4, no murmur.  Trace ankle edema.  No carotid bruit.  Normal pedal pulses.  Abdomen: Soft, nontender, no hepatosplenomegaly, no distention.  Skin: Intact without lesions or rashes.  Neurologic: Alert and oriented x 3.  Psych: Normal affect. Extremities: No clubbing or cyanosis.  HEENT: Normal.   Recent Labs: 03/27/2019: ALT 24; Magnesium 2.4 03/29/2019: Hemoglobin 9.9; Platelets 401 03/30/2019: TSH 2.437 04/09/2019: B Natriuretic Peptide 494.5; BUN 36; Creatinine, Ser 1.73; Potassium 3.6; Sodium 140  Personally reviewed   Wt Readings from Last 3 Encounters:  04/09/19 85.7 kg (189 lb)  04/01/19 88 kg (194 lb)  04/01/19 88.5 kg (195 lb)      ASSESSMENT AND PLAN:  1.  Pulmonary hypertension: Severe PAH with preserved cardiac output by cath in 3/20 (systemic pressure).  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  Has been known for several years.  In 2017, V/Q study was negative for chronic PE and sleep study was negative.  PFTs suggestive of restriction, has mild ILD on CT. Echo in 1/20 showed moderately dilated RV with moderately decreased systolic function.  She is on ambrisentan 5 mg daily, developed significant edema at 10 mg daily.  Malvin Johns has been increased to 1200 mcg bid, she cannot tolerate higher dose.  On exam today, she is volume overloaded with NYHA class III symptoms.  - Continue ambrisentan 5 mg daily, will not increase.  - Continue selexipag 1200 mcg bid, cannot tolerate higher dose.  - Adcirca will be the next step but would like to stabilize volume first.    - Increase torsemide to 80 qam/40 qpm x 5 days then 60 qam/40 qpm.  Increase KCl to 60 qam/40 qpm.  - I will arrange for repeat echo.  - BMET/BNP today, BMET 10 days.    - Will see how she does with aggressive treatment, may end up needing parenteral therapy.  - Oxygen saturation was measured today with ambulation, dropped to 86%.  I will arrange for oxygen for use with exertion.  2. Scleroderma: Followed by rheumatology at [facility].  She is on Mycophenolate.  She has mild interstitial lung disease, pulmonary hypertension, and Raynauds phenomenon.  3. Interstitial lung disease: Stable on CT in 8/20, PFTs suggest restriction.  Likely related to scleroderma.  4. PFO: Noted by bubble study in past.  5. Long QT syndrome: Has been followed by EP in the past, she is back on a beta blocker (bisoprolol).  QTc remains long on ECG today.  No palpitations or syncope.  - Given occasional orthostatic symptoms (which could also be due to severe pulmonary hypertension), will move bisoprolol to the evening before bed.  6. Hypocalcemia: Recent admission with hypocalcemia, etiology unclear.  Will recheck Ca2+ on BMET today.  She follows for this with Dr. Dorris Fetch.   Followup in 10 days with me.   Signed, Loralie Champagne, MD  04/09/2019  [place] [address] [facility] [place] [address] [phone] (office) [phone] (fax)

## 2019-04-09 NOTE — Progress Notes (Signed)
SATURATION QUALIFICATIONS: (This note is used to comply with regulatory documentation for home oxygen)  Patient Saturations on Room Air at Rest = 9*7%  Patient Saturations on Room Air while Ambulating = 86%  Patient Saturations on 2 Liters of oxygen while Ambulating = 90%  Please briefly explain why patient needs home oxygen:SOB

## 2019-04-09 NOTE — Patient Instructions (Signed)
INCREASE Torsemide to 80 mg in the AM and 40 mg in the PM for 5 days, then 60 mg in the AM and 40 mg in the PM thereafter. INCREASE Potassium to 60 MEQ in the AM and 40 MEQ in the PM  Labs today We will only contact you if something comes back abnormal or we need to make some changes. Otherwise no news is good news!  Labs needed in 10 days  Your physician recommends that you schedule a follow-up appointment in: 10-14 days with Dr Aundra Dubin and echo  Your physician has requested that you have an echocardiogram. Echocardiography is a painless test that uses sound waves to create images of your heart. It provides your doctor with information about the size and shape of your heart and how well your heart's chambers and valves are working. This procedure takes approximately one hour. There are no restrictions for this procedure.  Do the following things EVERYDAY: 1) Weigh yourself in the morning before breakfast. Write it down and keep it in a log. 2) Take your medicines as prescribed 3) Eat low salt foods-Limit salt (sodium) to 2000 mg per day.  4) Stay as active as you can everyday 5) Limit all fluids for the day to less than 2 liters  At the [facility], you and your health needs are our priority. As part of our continuing mission to provide you with exceptional heart care, we have created designated Provider Care Teams. These Care Teams include your primary Cardiologist (physician) and Advanced Practice Providers (APPs- Physician Assistants and Nurse Practitioners) who all work together to provide you with the care you need, when you need it.   You may see any of the following providers on your designated Care Team at your next follow up: [name] Dr Glori Bickers . Dr Loralie Champagne . Darrick Grinder, NP   Please be sure to bring in all your medications bottles to every appointment.

## 2019-04-10 ENCOUNTER — Other Ambulatory Visit: Payer: Self-pay | Admitting: "Endocrinology

## 2019-04-10 ENCOUNTER — Ambulatory Visit (INDEPENDENT_AMBULATORY_CARE_PROVIDER_SITE_OTHER): Payer: 59 | Admitting: Family Medicine

## 2019-04-10 ENCOUNTER — Encounter: Payer: Self-pay | Admitting: Family Medicine

## 2019-04-10 VITALS — Ht 60.0 in | Wt 189.0 lb

## 2019-04-10 DIAGNOSIS — E559 Vitamin D deficiency, unspecified: Secondary | ICD-10-CM | POA: Diagnosis not present

## 2019-04-10 DIAGNOSIS — IMO0001 Reserved for inherently not codable concepts without codable children: Secondary | ICD-10-CM

## 2019-04-10 MED ORDER — VITAMIN D (ERGOCALCIFEROL) 1.25 MG (50000 UNIT) PO CAPS: 50000.0000 [IU] | ORAL_CAPSULE | ORAL | 1 refills | Status: DC

## 2019-04-10 NOTE — Progress Notes (Signed)
Virtual Visit via Telephone Note   This visit type was conducted due to national recommendations for restrictions regarding the COVID-19 Pandemic (e.g. social distancing) in an effort to limit this patient's exposure and mitigate transmission in our community.  Due to her co-morbid illnesses, this patient is at least at moderate risk for complications without adequate follow up.  This format is felt to be most appropriate for this patient at this time.  The patient did not have access to video technology/had technical difficulties with video requiring transitioning to audio format only (telephone).  All issues noted in this document were discussed and addressed.  No physical exam could be performed with this format.    Evaluation Performed:  Follow-up visit  Date:  04/10/2019   ID:  Sabrina [name], Sabrina [name] [birth year], MRN [id number]  Patient Location: Home Provider Location: Office  Location of Patient: Home Location of Provider: Telehealth Consent was obtain for visit to be over via telehealth. I verified that I am speaking with the correct person using two identifiers.  PCP:  Sabrina Helper, MD   Chief Complaint:  TOC   History of Present Illness:    Sabrina [name] is a 58 y.o. female with extensive history of connective tissue disease, scleroderma, hypertension, pulmonary hypertension, chronic diastolic heart failure, interstitial lung disease, diabetes without complication among others.  Recently admitted to the hospital secondary to her endocrinologist finding that she had severe hypocalcemia and hypokalemia on labs.  She was found to have pulmonary vascular congestion on her chest x-ray and was short of breath on exertion.  Overall she had felt well and did not have any issues outside of these.  It was reported that she ran out of her medication for 2 weeks.  But she reported that she did not get any of her medications from her provider for this.  She reports she  was not taking any specific calcium.  Her calcium and potassium levels had improved after several days.  And she was discharged home to follow-up with endocrinology and PCP.  Since admission she has followed up with cardiology, pulmonology, and touch base with her rheumatologist who has now set her up to be referred to nephrology.  Possible need for phosphorus binding medication to prevent calcium depletion.  She reports that she was doing much better when she was on prescription dose of vitamin D.  Vitamin D level was not extremely low on check.  Additionally her recheck of calcium demonstrated that she was dropping again already as well.  And following up with cardiology it was demonstrated that she continues to have shortness of breath.  And significant fatigue. Given her extensive pulmonary hypertension she might require more aggressive treatment in the future.  Increasing in some of her medications and a repeat echo and being that have been ordered by her cardiologist.  Her scleroderma is followed by rheumatologist at [facility].  She is on my mycophenolate.  She has mild interstitial lung disease, pulmonary hypertension and Raynolds phenomenon.  Interstitial lung disease secondary to scleroderma.  Long QT syndrome she is on beta-blocker for this.  QTC remained long on ECG when checked by Dr. Aundra Dubin yesterday.  Her oxygen dropped while she was ambulating in the office with him yesterday at the heart clinic he is arranging for early oxygen.  She has a follow-up appointment in 10 days to be seen again at the advanced heart care clinic at [facility].  The patient does not have symptoms  concerning for COVID-19 infection (fever, chills, cough, or new shortness of breath).   Past Medical, Surgical, Social History, Allergies, and Medications have been Reviewed.  Past Medical History:  Diagnosis Date  . Allergy   . Anemia   . Arthritis   . Asthma   . Chronic bronchitis ([place])   . Connective tissue disease  ([place])   . Diabetes mellitus   . Endometriosis   . Fibroid uterus   . GERD (gastroesophageal reflux disease)   . History of Doppler ultrasound    a. Carotid US 8/08:Estimated stenosis in the right and left internal carotid arteries is 0-50% and 0-50%.  . History of echocardiogram    a. Echo 6/16: EF 60-65%, no RWMA.  . History of hiatal hernia   . History of nuclear stress test    a. Myoview 6/16: There is no resting or stress perfusion defect consistent with no prior infarct and no ischemia. EF 73 %  The patient was hypertensive prior and during the study. Cardiac cath 04/2016 showed normal coronary arteries  . Hx of iron deficiency anemia   . Hypersomnia with sleep apnea   . Hypertension   . Lupus ([place]) 07/2013   Of Skin  . Menorrhagia   . Obesity   . Pneumonia   . 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 Secondary hyperthyroidism   . Thyroid disease   . Tinnitus    left ear  . Tubular adenoma of colon   . Vertigo   . Vitamin D deficiency    Past Surgical History:  Procedure Laterality Date  . ABDOMINAL HYSTERECTOMY  2004   supracervical hysterectomy  . BREAST SURGERY  2005   reduction  . BREATH TEK H PYLORI [name] 12/24/2014   Procedure: BREATH TEK H PYLORI;  Surgeon: Greer Pickerel, MD;  Location: [facility];  Service: General;  Laterality: [name];  . CARDIAC CATHETERIZATION [name] 03/22/2016   Procedure: Right/Left Heart Cath and Coronary Angiography;  Surgeon: Troy Sine, MD;  Location: [place] [facility];  Service: Cardiovascular;  Laterality: [name];  . DILATION AND CURETTAGE OF UTERUS     age 74  . ESOPHAGOGASTRODUODENOSCOPY (EGD) WITH PROPOFOL [name] 10/14/2015   Procedure: ESOPHAGOGASTRODUODENOSCOPY (EGD) WITH PROPOFOL;  Surgeon: Clarene Essex, MD;  Location: [facility];  Service: Endoscopy;  Laterality: [name];  . LAPAROSCOPIC GASTRIC SLEEVE RESECTION WITH HIATAL HERNIA REPAIR [name] 07/05/2015   Procedure: LAPAROSCOPIC GASTRIC SLEEVE RESECTION WITH HIATAL HERNIA REPAIR, upper endoscopy;   Surgeon: Greer Pickerel, MD;  Location: [facility];  Service: General;  Laterality: [name];  . REDUCTION MAMMAPLASTY Bilateral   . RIGHT HEART CATH [name] 10/13/2018   Procedure: RIGHT HEART CATH;  Surgeon: Larey Dresser, MD;  Location: [place] [facility];  Service: Cardiovascular;  Laterality: [name];     Current Meds  Medication Sig  . acetaminophen (TYLENOL) 500 MG tablet Take 500-1,000 mg by mouth every 6 (six) hours as needed for headache.   [name] ambrisentan (LETAIRIS) 5 MG tablet Take 1 tablet (5 mg total) by mouth daily.  [name] aspirin EC 81 MG tablet Take 1 tablet (81 mg total) by mouth daily.  . bisoprolol (ZEBETA) 5 MG tablet Take 0.5 tablets (2.5 mg total) by mouth daily.  . Calcium Carb-Cholecalciferol (CALCIUM + D3) 600-200 MG-UNIT TABS Take 1 tablet by mouth 2 (two) times daily with a meal.  . levothyroxine (SYNTHROID) 75 MCG tablet TAKE 1 TABLET (75 MCG TOTAL) BY MOUTH DAILY BEFORE BREAKFAST.  . Magnesium Oxide 250 MG TABS Take 1  tablet by mouth daily with breakfast.  . Multiple Vitamin (MULTIVITAMIN WITH MINERALS) TABS tablet Take 1 tablet by mouth daily.  . mycophenolate (MYFORTIC) 360 MG TBEC EC tablet Take 720 mg by mouth 2 (two) times daily.   . nitroGLYCERIN (NITROSTAT) 0.4 MG SL tablet Place 0.4 mg under the tongue every 5 (five) minutes x 3 doses as needed for chest pain.   . pantoprazole (PROTONIX) 20 MG tablet Take 20 mg by mouth See admin instructions. Take 1 tablet (20 mg) by mouth scheduled each morning, may take an additional dose in the evening if needed for heartburn/indigestion.  . potassium chloride (K-DUR) 10 MEQ tablet Take 6 tablets (60 mEq total) by mouth every morning AND 4 tablets (40 mEq total) every evening.  . Selexipag (UPTRAVI) 1200 MCG TABS Take 1,200 mcg by mouth every 12 (twelve) hours.  . temazepam (RESTORIL) 15 MG capsule Take 1 capsule (15 mg total) by mouth at bedtime as needed for sleep.  [name] torsemide (DEMADEX) 20 MG tablet 80 mg in the AM and 40 mg in the PM for 5  days, then 60 mg in the AM and 40 mg in the PM thereafter  . triamcinolone ointment (KENALOG) 0.1 % Apply 1 application topically 2 (two) times daily as needed (dry/irritated skin.). APPLY AS DIRECTED TO ITCHING SPOTS ON THE BODY DAILY TO TWICE DAILY AS NEEDED. NOT TO FACE.   [name] UNABLE TO FIND Overnight Pulse Oximetry (to evaluate the need for continued nocturnal oxygen)  DX I27.20     Allergies:   Peanut oil, Potassium iodide, Ace inhibitors, Doxycycline, Peanut-containing drug products, Amlodipine, Codeine, Latex, and Penicillins   Social History   Tobacco Use  . Smoking status: Never Smoker  . Smokeless tobacco: Never Used  Substance Use Topics  . Alcohol use: No    Alcohol/week: 0.0 standard drinks  . Drug use: No     Family Hx: The patient's family history includes Arthritis in her father, mother, and sister; Bleeding Disorder in her sister; Breast cancer in her sister; CAD in her mother; Cancer in her father; Colon cancer in her maternal grandfather; Depression in her father, mother, and sister; Diabetes in her father and mother; Hypertension in her father, mother, and sister; Stroke in her father and maternal aunt; Thyroid disease in her sister. There is no history of Heart attack.  ROS:   Please see the history of present illness.    All other systems reviewed and are negative.  Labs/Other Tests and Data Reviewed:     Recent Labs: 03/27/2019: ALT 24; Magnesium 2.4 03/29/2019: Hemoglobin 9.9; Platelets 401 03/30/2019: TSH 2.437 04/09/2019: B Natriuretic Peptide 494.5; BUN 36; Creatinine, Ser 1.73; Potassium 3.6; Sodium 140   Recent Lipid Panel Lab Results  Component Value Date/Time   CHOL 166 12/23/2017 07:38 AM   TRIG 109 12/23/2017 07:38 AM   HDL 35 (L) 12/23/2017 07:38 AM   CHOLHDL 4.7 12/23/2017 07:38 AM   LDLCALC 109 (H) 12/23/2017 07:38 AM    Wt Readings from Last 3 Encounters:  04/10/19 189 lb (85.7 kg)  04/09/19 189 lb (85.7 kg)  04/01/19 194 lb (88 kg)      Objective:    Vital Signs:  Ht 5' (1.524 m)   Wt 189 lb (85.7 kg)   LMP 10/31/2002   BMI 36.91 kg/m    GEN:  alert and oriented RESPIRATORY:  no shortness of breath noted in conversation PSYCH:  normal mood and affect  ASSESSMENT & PLAN:   1.  Transition of care performed with sharing of clinical summary As documented above. As previously discussed she is followed by rheumatology, cardiology, and will be seeing nephrology soon.  And is followed by endocrinology. I appreciate collaboration in patient's plan of care. Please let PCP know if assistance from [place] is needed.  2. Vitamin D deficiency Reports that she did not feel like she was having any issues until she stopped the vitamin D prescription.  Will be restarting the vitamin D to see if that will help with some of the balance and the calcium depletion.  - Vitamin D, Ergocalciferol, (DRISDOL) 1.25 MG (50000 UT) CAPS capsule; Take 1 capsule (50,000 Units total) by mouth every 7 (seven) days.  Dispense: 12 capsule; Refill: 1  3. Hypocalcemia She reports that she is now being referred to a nephrologist.  Possible need for phosphorus binding medication to help prevent acute calcium depletion.  Restarting vitamin D to help make sure that that keeps calcium balanced as well.    Time:   Today, I have spent 10 minutes with the patient with telehealth technology discussing the above problems.     Medication Adjustments/Labs and Tests Ordered: Current medicines are reviewed at length with the patient today.  Concerns regarding medicines are outlined above.   Tests Ordered: No orders of the defined types were placed in this encounter.   Medication Changes: Meds ordered this encounter  Medications  . Vitamin D, Ergocalciferol, (DRISDOL) 1.25 MG (50000 UT) CAPS capsule    Sig: Take 1 capsule (50,000 Units total) by mouth every 7 (seven) days.    Dispense:  12 capsule    Refill:  1    Order Specific Question:   Supervising Provider     Answer:   Sabrina [name] [id number]    Disposition:  Follow up prn 08/10/2019   Signed, Perlie Mayo, NP  04/10/2019 1:03 PM     [facility]

## 2019-04-10 NOTE — Patient Instructions (Signed)
    Thank you for completing via telephone.  I appreciate the opportunity to provide you with the care for your health and wellness. Today we discussed: Transition of care  Follow-up:08/10/2019 or as needed  No labs today it appears that cardiology, endocrinology, and rheumatology have all ordered the appropriate labs and have you with follow-ups coming up.  If you do need Dr. Moshe Cipro or I in the next time frame before your appointment in December please do not hesitate to reach out.  Would like to get you in here to get your flu shot if you do not receive it at 1 of the other offices.  As discussed today your calcium depletion could be related to several factors.  A few of these include problems with vitamin D and phosphorus (which could be related to your kidney function). The kidney provider will discuss possible phosphorus binding medication (if they think it will help) that can help prevent the acute drop of calcium.  Also restarting your vitamin D today hopefully that will help you feel better as you reported that you are feeling okay and not having issues until this stopped.   I know that you will continue to follow-up with your specialist.  Dr. Moshe Cipro I will review your notes in epic as they are completed.  Again do not hesitate to reach out if you do need [place].  Please stay safe and healthy.  Please continue to practice social distancing to keep you, your family, and our community safe.  If you must go out, please wear a Mask and practice good handwashing.  [place] YOUR HANDS WELL AND FREQUENTLY. AVOID TOUCHING YOUR FACE, UNLESS YOUR HANDS ARE FRESHLY WASHED.  GET FRESH AIR DAILY. STAY HYDRATED WITH WATER.   It was a pleasure to see you and I look forward to continuing to work together on your health and well-being. Please do not hesitate to call the office if you need care or have questions about your care.  Have a wonderful day and week.  With Gratitude,  Cherly Beach, DNP,  AGNP-BC

## 2019-04-13 ENCOUNTER — Other Ambulatory Visit: Payer: Self-pay | Admitting: "Endocrinology

## 2019-04-13 LAB — COMPLETE METABOLIC PANEL WITH GFR
AG Ratio: 1.2 (calc) (ref 1.0–2.5)
ALT: 8 U/L (ref 6–29)
AST: 13 U/L (ref 10–35)
Albumin: 3.7 g/dL (ref 3.6–5.1)
Alkaline phosphatase (APISO): 49 U/L (ref 37–153)
BUN/Creatinine Ratio: 21 (calc) (ref 6–22)
BUN: 39 mg/dL — ABNORMAL HIGH (ref 7–25)
CO2: 29 mmol/L (ref 20–32)
Calcium: 6.2 mg/dL — ABNORMAL LOW (ref 8.6–10.4)
Chloride: 101 mmol/L (ref 98–110)
Creat: 1.84 mg/dL — ABNORMAL HIGH (ref 0.50–1.05)
GFR, Est African American: 35 mL/min/{1.73_m2} — ABNORMAL LOW (ref >=60)
GFR, Est Non African American: 30 mL/min/{1.73_m2} — ABNORMAL LOW (ref >=60)
Globulin: 3.2 g/dL (calc) (ref 1.9–3.7)
Glucose, Bld: 107 mg/dL — ABNORMAL HIGH (ref 65–99)
Potassium: 3.9 mmol/L (ref 3.5–5.3)
Sodium: 142 mmol/L (ref 135–146)
Total Bilirubin: 0.3 mg/dL (ref 0.2–1.2)
Total Protein: 6.9 g/dL (ref 6.1–8.1)

## 2019-04-13 MED ORDER — CALCIUM CARBONATE 1250 (500 CA) MG PO TABS: 2.0000 | ORAL_TABLET | Freq: Three times a day (TID) | ORAL | 3 refills | Status: DC

## 2019-04-17 ENCOUNTER — Encounter (HOSPITAL_COMMUNITY): Payer: 59 | Admitting: Cardiology

## 2019-04-21 ENCOUNTER — Ambulatory Visit: Payer: 59 | Admitting: Cardiology

## 2019-04-21 ENCOUNTER — Encounter

## 2019-04-22 ENCOUNTER — Ambulatory Visit (HOSPITAL_BASED_OUTPATIENT_CLINIC_OR_DEPARTMENT_OTHER)
Admission: RE | Admit: 2019-04-22 | Discharge: 2019-04-22 | Disposition: A | Discharge status: Home / Self Care | Payer: 59 | Source: Ambulatory Visit | Attending: Cardiology | Admitting: Cardiology

## 2019-04-22 ENCOUNTER — Other Ambulatory Visit: Payer: Self-pay

## 2019-04-22 ENCOUNTER — Observation Stay (HOSPITAL_COMMUNITY)
Admission: EM | Admit: 2019-04-22 | Discharge: 2019-04-23 | Disposition: A | Discharge status: Home / Self Care | Payer: 59 | Attending: Family Medicine | Admitting: Family Medicine

## 2019-04-22 ENCOUNTER — Other Ambulatory Visit (HOSPITAL_COMMUNITY): Payer: Self-pay | Admitting: *Deleted

## 2019-04-22 ENCOUNTER — Encounter (HOSPITAL_COMMUNITY): Payer: Self-pay | Admitting: Cardiology

## 2019-04-22 ENCOUNTER — Encounter (HOSPITAL_COMMUNITY): Payer: Self-pay

## 2019-04-22 VITALS — BP 130/84 | HR 77 | Wt 191.8 lb

## 2019-04-22 DIAGNOSIS — E039 Hypothyroidism, unspecified: Secondary | ICD-10-CM | POA: Diagnosis not present

## 2019-04-22 DIAGNOSIS — I5032 Chronic diastolic (congestive) heart failure: Secondary | ICD-10-CM

## 2019-04-22 DIAGNOSIS — Z7982 Long term (current) use of aspirin: Secondary | ICD-10-CM | POA: Insufficient documentation

## 2019-04-22 DIAGNOSIS — I110 Hypertensive heart disease with heart failure: Secondary | ICD-10-CM | POA: Diagnosis not present

## 2019-04-22 DIAGNOSIS — E876 Hypokalemia: Secondary | ICD-10-CM | POA: Diagnosis not present

## 2019-04-22 DIAGNOSIS — M349 Systemic sclerosis, unspecified: Secondary | ICD-10-CM | POA: Diagnosis present

## 2019-04-22 DIAGNOSIS — E119 Type 2 diabetes mellitus without complications: Secondary | ICD-10-CM | POA: Insufficient documentation

## 2019-04-22 DIAGNOSIS — J45909 Unspecified asthma, uncomplicated: Secondary | ICD-10-CM | POA: Diagnosis not present

## 2019-04-22 DIAGNOSIS — Z79899 Other long term (current) drug therapy: Secondary | ICD-10-CM | POA: Diagnosis not present

## 2019-04-22 DIAGNOSIS — R9431 Abnormal electrocardiogram [ECG] [EKG]: Secondary | ICD-10-CM | POA: Diagnosis not present

## 2019-04-22 DIAGNOSIS — Z20828 Contact with and (suspected) exposure to other viral communicable diseases: Secondary | ICD-10-CM | POA: Insufficient documentation

## 2019-04-22 DIAGNOSIS — I2721 Secondary pulmonary arterial hypertension: Secondary | ICD-10-CM | POA: Diagnosis not present

## 2019-04-22 DIAGNOSIS — I5022 Chronic systolic (congestive) heart failure: Secondary | ICD-10-CM | POA: Diagnosis not present

## 2019-04-22 DIAGNOSIS — N2581 Secondary hyperparathyroidism of renal origin: Secondary | ICD-10-CM | POA: Diagnosis present

## 2019-04-22 LAB — BASIC METABOLIC PANEL
Anion gap: 13 (ref 5–15)
Anion gap: 14 (ref 5–15)
BUN: 37 mg/dL — ABNORMAL HIGH (ref 6–20)
BUN: 41 mg/dL — ABNORMAL HIGH (ref 6–20)
CO2: 24 mmol/L (ref 22–32)
CO2: 27 mmol/L (ref 22–32)
Calcium: 6.1 mg/dL — CL (ref 8.9–10.3)
Calcium: 6.1 mg/dL — CL (ref 8.9–10.3)
Chloride: 102 mmol/L (ref 98–111)
Chloride: 97 mmol/L — ABNORMAL LOW (ref 98–111)
Creatinine, Ser: 1.8 mg/dL — ABNORMAL HIGH (ref 0.44–1.00)
Creatinine, Ser: 1.55 mg/dL — ABNORMAL HIGH (ref 0.44–1.00)
GFR calc Af Amer: 36 mL/min — ABNORMAL LOW (ref >60)
GFR calc Af Amer: 43 mL/min — ABNORMAL LOW (ref >60)
GFR calc non Af Amer: 31 mL/min — ABNORMAL LOW (ref >60)
GFR calc non Af Amer: 37 mL/min — ABNORMAL LOW (ref >60)
Glucose, Bld: 102 mg/dL — ABNORMAL HIGH (ref 70–99)
Glucose, Bld: 89 mg/dL (ref 70–99)
Potassium: 3.5 mmol/L (ref 3.5–5.1)
Potassium: 3.1 mmol/L — ABNORMAL LOW (ref 3.5–5.1)
Sodium: 139 mmol/L (ref 135–145)
Sodium: 138 mmol/L (ref 135–145)

## 2019-04-22 LAB — CBC WITH DIFFERENTIAL/PLATELET
Abs Immature Granulocytes: 0.06 10*3/uL (ref 0.00–0.07)
Basophils Absolute: 0 10*3/uL (ref 0.0–0.1)
Basophils Relative: 0 %
Eosinophils Absolute: 0.3 10*3/uL (ref 0.0–0.5)
Eosinophils Relative: 3 %
HCT: 37.7 % (ref 36.0–46.0)
Hemoglobin: 11.3 g/dL — ABNORMAL LOW (ref 12.0–15.0)
Immature Granulocytes: 1 %
Lymphocytes Relative: 20 %
Lymphs Abs: 2.2 10*3/uL (ref 0.7–4.0)
MCH: 24.5 pg — ABNORMAL LOW (ref 26.0–34.0)
MCHC: 30 g/dL (ref 30.0–36.0)
MCV: 81.6 fL (ref 80.0–100.0)
Monocytes Absolute: 0.7 10*3/uL (ref 0.1–1.0)
Monocytes Relative: 7 %
Neutro Abs: 7.8 10*3/uL — ABNORMAL HIGH (ref 1.7–7.7)
Neutrophils Relative %: 69 %
Platelets: 372 10*3/uL (ref 150–400)
RBC: 4.62 MIL/uL (ref 3.87–5.11)
RDW: 16.4 % — ABNORMAL HIGH (ref 11.5–15.5)
WBC: 11.2 10*3/uL — ABNORMAL HIGH (ref 4.0–10.5)
nRBC: 0 % (ref 0.0–0.2)

## 2019-04-22 LAB — HEPATIC FUNCTION PANEL
ALT: 16 U/L (ref 0–44)
AST: 18 U/L (ref 15–41)
Albumin: 4 g/dL (ref 3.5–5.0)
Alkaline Phosphatase: 62 U/L (ref 38–126)
Bilirubin, Direct: 0.1 mg/dL (ref 0.0–0.2)
Indirect Bilirubin: 0.4 mg/dL (ref 0.3–0.9)
Total Bilirubin: 0.5 mg/dL (ref 0.3–1.2)
Total Protein: 8 g/dL (ref 6.5–8.1)

## 2019-04-22 LAB — MAGNESIUM: Magnesium: 2.3 mg/dL (ref 1.7–2.4)

## 2019-04-22 LAB — SARS CORONAVIRUS 2 BY RT PCR (HOSPITAL ORDER, PERFORMED IN ~~LOC~~ HOSPITAL LAB): SARS Coronavirus 2: NEGATIVE

## 2019-04-22 MED ORDER — SODIUM CHLORIDE 0.9 % IV SOLN: 1.0000 g | Freq: Once | INTRAVENOUS | Status: DC

## 2019-04-22 MED ORDER — TADALAFIL (PAH) 20 MG PO TABS: 20.0000 mg | ORAL_TABLET | Freq: Every day | ORAL | 6 refills | Status: DC

## 2019-04-22 MED ORDER — CALCIUM GLUCONATE-NACL 1-0.675 GM/50ML-% IV SOLN
1.0000 g | Freq: Once | INTRAVENOUS | Status: AC
  Administered 2019-04-22: 1000 mg via INTRAVENOUS
  Filled 2019-04-22: qty 50

## 2019-04-22 MED ORDER — INSULIN ASPART 100 UNIT/ML ~~LOC~~ SOLN: 0.0000 [IU] | Freq: Three times a day (TID) | SUBCUTANEOUS | Status: DC

## 2019-04-22 MED ORDER — POTASSIUM CHLORIDE CRYS ER 20 MEQ PO TBCR
40.0000 meq | EXTENDED_RELEASE_TABLET | Freq: Once | ORAL | Status: AC
  Administered 2019-04-22: 20:00:00 40 meq via ORAL
  Filled 2019-04-22: qty 2

## 2019-04-22 NOTE — ED Provider Notes (Addendum)
[facility] EMERGENCY DEPARTMENT Provider Note   CSN: [id number] Arrival date & time: 04/22/19  1726     History   Chief Complaint Chief Complaint  Patient presents with  . Abnormal Lab    HPI Sabrina [name] is a 58 y.o. female with a history  of scleroderma, chronic diastolic heart failure, pulmonary hypertension, prolonged QT syndrome, diabetes mellitus and history of hypocalcemia and hypokalemia, seen by her endocrinologist this morning and lab revealed hypocalcemia of 6.1.  She denies any current symptoms of weakness or muscle spasm but has endorsed intermittent spasm in her lower extremities.  She has chronic dyspnea secondary to CHF and pulmonary htn but denies any increased shortness of breath today.  Todays earlier appt also included an echo revealing improving chf with EF of 60-65%.      The history is provided by the patient.    Past Medical History:  Diagnosis Date  . Allergy   . Anemia   . Arthritis   . Asthma   . Chronic bronchitis ([place])   . Connective tissue disease ([place])   . Diabetes mellitus   . Endometriosis   . Fibroid uterus   . GERD (gastroesophageal reflux disease)   . History of Doppler ultrasound    a. Carotid US 8/08:Estimated stenosis in the right and left internal carotid arteries is 0-50% and 0-50%.  . History of echocardiogram    a. Echo 6/16: EF 60-65%, no RWMA.  . History of hiatal hernia   . History of nuclear stress test    a. Myoview 6/16: There is no resting or stress perfusion defect consistent with no prior infarct and no ischemia. EF 73 %  The patient was hypertensive prior and during the study. Cardiac cath 04/2016 showed normal coronary arteries  . Hx of iron deficiency anemia   . Hypersomnia with sleep apnea   . Hypertension   . Lupus ([place]) 07/2013   Of Skin  . Menorrhagia   . Obesity   . Pneumonia   . 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 Secondary hyperthyroidism   . Thyroid disease   . Tinnitus    left ear  . Tubular  adenoma of colon   . Vertigo   . Vitamin D deficiency     Patient Active Problem List   Diagnosis Date Noted  . PAH (pulmonary artery hypertension) ([place]) 12/03/2018  . Hypoxia 08/19/2018  . Ischemia of digits of hand 05/17/2018  . Multinodular goiter 05/14/2018  . Diabetes mellitus without complication ([place]) [id number]  . Foreign body in finger-infected 03/19/2018  . Upper airway cough syndrome 02/04/2018  . ILD (interstitial lung disease) ([place]) 11/21/2017  . Insomnia 05/20/2017  . Morbid obesity ([place]) 05/20/2017  . Elevated TSH 12/16/2016  . Hypocalcemia 07/07/2016  . Hypokalemia 07/07/2016  . 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 Pulmonary hypertension ([place]) 04/06/2016  . Chronic diastolic heart failure ([place]) 04/06/2016  . Precordial pain   . Drug eruption 03/14/2016  . Psoriasis 10/18/2015  . GERD (gastroesophageal reflux disease) 07/05/2015  . 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 Headache 05/10/2015  . Scleroderma ([place]) 03/09/2015  . Metabolic syndrome X 0000000  . Encounter for long-term (current) use of other medications 01/14/2015  . Seasonal allergies 01/05/2015  . Iron deficiency anemia 09/11/2014  . Tubular adenoma of colon 09/11/2014  . Neoplasm of uncertain behavior of skin 05/09/2012  . Chronic fatigue 10/26/2010  . Secondary hyperparathyroidism ([place]) 12/02/2008  . Vitamin  D deficiency 10/10/2008  . Hypothyroidism 08/27/2007  . Essential hypertension 08/27/2007    Past Surgical History:  Procedure Laterality Date  . ABDOMINAL HYSTERECTOMY  2004   supracervical hysterectomy  . BREAST SURGERY  2005   reduction  . BREATH TEK H PYLORI [name] 12/24/2014   Procedure: BREATH TEK H PYLORI;  Surgeon: Greer Pickerel, MD;  Location: [facility];  Service: General;  Laterality: [name];  . CARDIAC CATHETERIZATION [name] 03/22/2016   Procedure: Right/Left Heart Cath and Coronary Angiography;  Surgeon:  Troy Sine, MD;  Location: [facility];  Service: Cardiovascular;  Laterality: [name];  . DILATION AND CURETTAGE OF UTERUS     age 50  . ESOPHAGOGASTRODUODENOSCOPY (EGD) WITH PROPOFOL [name] 10/14/2015   Procedure: ESOPHAGOGASTRODUODENOSCOPY (EGD) WITH PROPOFOL;  Surgeon: Clarene Essex, MD;  Location: [facility];  Service: Endoscopy;  Laterality: [name];  . LAPAROSCOPIC GASTRIC SLEEVE RESECTION WITH HIATAL HERNIA REPAIR [name] 07/05/2015   Procedure: LAPAROSCOPIC GASTRIC SLEEVE RESECTION WITH HIATAL HERNIA REPAIR, upper endoscopy;  Surgeon: Greer Pickerel, MD;  Location: [facility];  Service: General;  Laterality: [name];  . REDUCTION MAMMAPLASTY Bilateral   . RIGHT HEART CATH [name] 10/13/2018   Procedure: RIGHT HEART CATH;  Surgeon: Larey Dresser, MD;  Location: [place];  Service: Cardiovascular;  Laterality: [name];     OB History    Gravida  0   Para  0   Term  0   Preterm  0   AB  0   Living  0     SAB  0   TAB  0   Ectopic  0   Multiple  0   Live Births               Home Medications    Prior to Admission medications   Medication Sig Start Date End Date Taking? Authorizing Provider  acetaminophen (TYLENOL) 500 MG tablet Take 500-1,000 mg by mouth every 6 (six) hours as needed for headache.    Yes [provider]  ambrisentan (LETAIRIS) 5 MG tablet Take 1 tablet (5 mg total) by mouth daily. 12/08/18  Yes Larey Dresser, MD  aspirin EC 81 MG tablet Take 1 tablet (81 mg total) by mouth daily. 03/20/16  Yes Weaver, Scott T, PA-C  bisoprolol (ZEBETA) 5 MG tablet Take 0.5 tablets (2.5 mg total) by mouth daily. 03/12/19  Yes Larey Dresser, MD  Calcium Carb-Cholecalciferol (CALCIUM + D3) 600-200 MG-UNIT TABS Take 1 tablet by mouth 2 (two) times daily with a meal.   Yes [provider]  levothyroxine (SYNTHROID) 75 MCG tablet TAKE 1 TABLET (75 MCG TOTAL) BY MOUTH DAILY BEFORE BREAKFAST. 03/03/19  Yes Nida, Marella Chimes, MD  Magnesium Oxide 250 MG TABS  Take 1 tablet by mouth daily with breakfast.   Yes [provider]  Multiple Vitamin (MULTIVITAMIN WITH MINERALS) TABS tablet Take 1 tablet by mouth daily.   Yes [provider]  mycophenolate (MYFORTIC) 360 MG TBEC EC tablet Take 720 mg by mouth 2 (two) times daily.  04/04/18  Yes [provider]  nitroGLYCERIN (NITROSTAT) 0.4 MG SL tablet Place 0.4 mg under the tongue every 5 (five) minutes x 3 doses as needed for chest pain.    Yes [provider]  pantoprazole (PROTONIX) 20 MG tablet Take 20 mg by mouth See admin instructions. Take 1 tablet (20 mg) by mouth scheduled each morning, may take an additional dose in the evening if needed for heartburn/indigestion. 10/01/18  Yes [provider]  potassium chloride (K-DUR) 10 MEQ tablet Take 6 tablets (60 mEq total) by mouth every morning AND 4 tablets (40 mEq total) every evening. 04/09/19  Yes Larey Dresser, MD  Selexipag (UPTRAVI) 1200 MCG TABS Take 1,200 mcg by mouth every 12 (twelve) hours. 01/22/19  Yes Larey Dresser, MD  temazepam (RESTORIL) 15 MG capsule Take 1 capsule (15 mg total) by mouth at bedtime as needed for sleep. 03/10/19  Yes Fayrene Helper, MD  torsemide (DEMADEX) 20 MG tablet 80 mg in the AM and 40 mg in the PM for 5 days, then 60 mg in the AM and 40 mg in the PM thereafter Patient taking differently: Take 20-40 mg by mouth See admin instructions. 40mg  in the morning and 20mg  in the evening 04/09/19  Yes Larey Dresser, MD  triamcinolone ointment (KENALOG) 0.1 % Apply 1 application topically 2 (two) times daily as needed (dry/irritated skin.). APPLY AS DIRECTED TO ITCHING SPOTS ON THE BODY DAILY TO TWICE DAILY AS NEEDED. NOT TO FACE.    Yes [provider]  Vitamin D, Ergocalciferol, (DRISDOL) 1.25 MG (50000 UT) CAPS capsule Take 1 capsule (50,000 Units total) by mouth every 7 (seven) days. Patient taking differently: Take 50,000 Units by mouth every Thursday.  04/10/19  Yes  Perlie Mayo, NP  tadalafil, PAH, (ADCIRCA) 20 MG tablet Take 1 tablet (20 mg total) by mouth daily. 04/22/19   Larey Dresser, MD  UNABLE TO FIND Overnight Pulse Oximetry (to evaluate the need for continued nocturnal oxygen)  DX I27.20 09/17/18   Fayrene Helper, MD  potassium chloride (K-DUR) 10 MEQ tablet Take 2 tablets by mouth twice daily 12/01/18 04/01/19  Fayrene Helper, MD    Family History Family History  Problem Relation Age of Onset  . Hypertension Mother   . Diabetes Mother   . Arthritis Mother   . Depression Mother   . CAD Mother        s/p PCI  . Diabetes Father   . Stroke Father   . Arthritis Father   . Depression Father   . Hypertension Father   . Cancer Father        prostate  . Hypertension Sister        x 2  . Breast cancer Sister   . Arthritis Sister   . Bleeding Disorder Sister   . Depression Sister   . Thyroid disease Sister   . Colon cancer Maternal Grandfather   . Stroke Maternal Aunt   . Heart attack Neg Hx     Social History Social History   Tobacco Use  . Smoking status: Never Smoker  . Smokeless tobacco: Never Used  Substance Use Topics  . Alcohol use: No    Alcohol/week: 0.0 standard drinks  . Drug use: No     Allergies   Peanut oil, Potassium iodide, Ace inhibitors, Doxycycline, Peanut-containing drug products, Amlodipine, Codeine, Latex, and Penicillins   Review of Systems Review of Systems  Constitutional: Negative for chills and fever.  HENT: Negative for congestion and sore throat.   Eyes: Negative.   Respiratory: Negative for chest tightness and shortness of breath.   Cardiovascular: Negative for chest pain.  Gastrointestinal: Negative for abdominal pain, nausea and vomiting.  Genitourinary: Negative.   Musculoskeletal: Negative for arthralgias, joint swelling, myalgias and neck pain.       Intermittent muscle spasm, none today  Skin: Negative.  Negative for rash and wound.  Neurological:  Negative for  dizziness, weakness, light-headedness, numbness and headaches.  Psychiatric/Behavioral: Negative.      Physical Exam Updated Vital Signs BP 102/71   Pulse 63   Temp 98.3 F (36.8 C) (Oral)   Resp (!) 22   Ht 5' (1.524 m)   Wt 86.6 kg   LMP 10/31/2002   SpO2 97%   BMI 37.30 kg/m   Physical Exam Vitals signs and nursing note reviewed.  Constitutional:      Appearance: She is well-developed.  HENT:     Head: Normocephalic and atraumatic.  Eyes:     Conjunctiva/sclera: Conjunctivae normal.  Neck:     Musculoskeletal: Normal range of motion.  Cardiovascular:     Rate and Rhythm: Normal rate and regular rhythm.     Heart sounds: Normal heart sounds.  Pulmonary:     Effort: Pulmonary effort is normal.     Breath sounds: Normal breath sounds. No wheezing.  Abdominal:     General: Bowel sounds are normal.     Palpations: Abdomen is soft.     Tenderness: There is no abdominal tenderness.  Musculoskeletal: Normal range of motion.        General: No swelling or tenderness.     Right lower leg: No edema.     Left lower leg: No edema.  Skin:    General: Skin is warm and dry.  Neurological:     Mental Status: She is alert.     Cranial Nerves: No cranial nerve deficit.     Sensory: Sensation is intact.     Motor: Motor function is intact.     Comments: No [place] sign.      ED Treatments / Results  Labs (all labs ordered are listed, but only abnormal results are displayed) Labs Reviewed  CBC WITH DIFFERENTIAL/PLATELET - Abnormal; Notable for the following components:      Result Value   WBC 11.2 (*)    Hemoglobin 11.3 (*)    MCH 24.5 (*)    RDW 16.4 (*)    Neutro Abs 7.8 (*)    All other components within normal limits  BASIC METABOLIC PANEL - Abnormal; Notable for the following components:   Potassium 3.1 (*)    Chloride 97 (*)    BUN 41 (*)    Creatinine, Ser 1.55 (*)    Calcium 6.1 (*)    GFR calc non Af Amer 37 (*)    GFR calc Af Amer 43 (*)    All  other components within normal limits  SARS CORONAVIRUS 2 (HOSPITAL ORDER, [facility])  MAGNESIUM    EKG EKG Interpretation  Date/Time:  Wednesday April 22 2019 18:31:05 EDT Ventricular Rate:  65 PR Interval:    QRS Duration: 182 QT Interval:  510 QTC Calculation: 531 R Axis:   104 Text Interpretation:  Sinus rhythm Nonspecific intraventricular conduction delay Repol abnrm suggests ischemia, diffuse leads ST depression V1-V3, suggest recording posterior leads Confirmed by Milton Ferguson [phone]) on 04/22/2019 8:04:49 PM   Radiology No results found.  Procedures Procedures (including critical care time)  Medications Ordered in ED Medications  potassium chloride SA (K-DUR) CR tablet 40 mEq (has no administration in time range)  calcium gluconate 1 g/ 50 mL sodium chloride IVPB (0 g Intravenous Stopped 04/22/19 1936)     Initial Impression / Assessment and Plan / ED Course  I have reviewed the triage vital signs and the nursing notes.  Pertinent labs & imaging results that  were available during my care of the patient were reviewed by me and considered in my medical decision making (see chart for details).        Pt's labs and ekg reviewed.  Confirmed significant hypocalcemia and QT prolongation, similar to prior admission.  Discussed with Dr. Orlin Hilding who accepts pt for admission.   CRITICAL CARE Performed by: Evalee Jefferson Total critical care time: 35 minutes Critical care time was exclusive of separately billable procedures and treating other patients. Critical care was necessary to treat or prevent imminent or life-threatening deterioration. Critical care was time spent personally by me on the following activities: development of treatment plan with patient and/or surrogate as well as nursing, discussions with consultants, evaluation of patient's response to treatment, examination of patient, obtaining history from patient or surrogate, ordering and  performing treatments and interventions, ordering and review of laboratory studies, ordering and review of radiographic studies, pulse oximetry and re-evaluation of patient's condition.   Final Clinical Impressions(s) / ED Diagnoses   Final diagnoses:  Hypocalcemia  Hypokalemia  QT prolongation    ED Discharge Orders    None       Landis Martins 04/22/19 2014    Evalee Jefferson, PA-C 04/22/19 2014    Milton Ferguson, MD 04/24/19 1052

## 2019-04-22 NOTE — ED Triage Notes (Signed)
Pt's PCP stated that her Calcium level is low. Pt has been having muscle cramps recently. Has a history of low Calcium levels.

## 2019-04-22 NOTE — Addendum Note (Signed)
Encounter addended by: Larey Dresser, MD on: 04/22/2019 10:39 PM  Actions taken: Clinical Note Signed

## 2019-04-22 NOTE — ED Notes (Signed)
Date and time results received: 04/22/19 1943 (use smartphrase ".now" to insert current time)  Test: Calcium Critical Value: 6.1  Name of Provider Notified: NA Orders Received? Or Actions Taken?: NA

## 2019-04-22 NOTE — Patient Instructions (Signed)
START Tadalafil 20mg  (1 tab) daily  Labs today We will only contact you if something comes back abnormal or we need to make some changes. Otherwise no news is good news!  Your physician recommends that you schedule a follow-up appointment in: 1 month with Dr Aundra Dubin  At the [facility], you and your health needs are our priority. As part of our continuing mission to provide you with exceptional heart care, we have created designated Provider Care Teams. These Care Teams include your primary Cardiologist (physician) and Advanced Practice Providers (APPs- Physician Assistants and Nurse Practitioners) who all work together to provide you with the care you need, when you need it.   You may see any of the following providers on your designated Care Team at your next follow up: [name] Dr Glori Bickers . Dr Loralie Champagne . Darrick Grinder, NP   Please be sure to bring in all your medications bottles to every appointment.

## 2019-04-22 NOTE — Progress Notes (Addendum)
Date:  04/22/2019   ID:  Sabrina, [name] Dec 19, 1960, MRN [id number]   Provider location: [facility] Type of Visit: Established patient  PCP:  Fayrene Helper, MD  Cardiologist:  Dr. Radford Pax HF Cardiology: Dr. Aundra Dubin   History of Present Illness: Sabrina [name] is a 58 y.o. female who has a history of scleroderma with associated mild ILD, Raynauds, and pulmonary hypertension.  She was referred by Dr. Radford Pax and had Bylas in 3/20 that showed severe pulmonary arterial hypertension with preserved cardiac output.  She has had the scleroderma diagnosis for > 10 years.  She is on mycophenolate.  Pulmonary hypertension is not a new diagnosis, she has been on ambrisentan.  She is followed by rheumatology at [facility].    After RHC, I had her increase her ambrisentan to 10 mg daily.    She did not tolerate increase in ambrisentan well, developed peripheral edema and weight went up.  I had her decrease the ambrisentan back to 5 mg daily and edema resolved.  She is now on Uptravi, dose has been increased up to 1200 mcg bid. She has not been able to tolerate higher dose.   She was admitted in 8/20 with hypocalcemia, dyspnea/CHF exacerbation.    CT chest in 8/20 showed stable interstitial lung disease, coronary artery calcification.   Patient returns for followup of pulmonary hypertension and RV failure.  She was short of breath and volume overloaded at last visit.  I started her on oxygen with exertion and increased her torsemide.  She feels like her breathing is much better on higher torsemide dosing.  She can walk slowly on flat ground without dyspnea.  She takes her time dressing and showering.  She can climb a flight of stairs but fatigues at the top.  No lightheadedness/syncope.  No chest pain.    Echo was done today and reviewed.  EF 60-65%, severe RV dilation with moderately decreased systolic function and D-shaped septum, PA systolic pressure 99 mmHg.     Labs (3/20): K 3.8, creatinine 1.64 Labs (4/20): TSH and free T4 normal, K 3.8, creatinine 1.14 Labs (8/20): K 3.6, creatinine 1.35 => 1.73 => 1.84  6 minute walk (7/20): 213 m 6 minute walk (9/20): 115 m (stopped due to leg cramps)  PMH: 1. Pulmonary hypertension: Thought to be related to scleroderma.   - V/Q scan (2017): No evidence for chronic PE.  - Sleep study (2017): Negative - Echo (1/20, [facility]): EF > 55%, moderately dilated RV with moderately decreased systolic function, moderate TR, positive bubble study.  - RHC (3/20): mean RA 11, PA 128/35 mean 69, mean PCWP 15, CI 2.32, PVR 12.6 WU.  - Cannot tolerate 10 mg ambrisentan due to edema.  - Echo (9/20): EF 60-65%, severe RV dilation with moderately decreased systolic function and D-shaped septum, PA systolic pressure 99 mmHg.   2. Suspect PFO: positive bubble study.  3. HTN 4. Obese 5. Scleroderma: +ANA, +anti-centromere antibody.  Diagnosis x 10 years.  Has associated Raynauds, mild ILD, and severe PAH.  6. Long QT syndrome: Has been on beta blocker.  7. 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  - PFTs (4/19): FVC 63%, FEV1 70%, ratio 109%, TLC 67%, DLCO 60% - CT chest (8/20): Stable interstitial lung disease, coronary artery calcification.  8. Hypothyroidism 9. PFO 10. Hypocalcemia: Etiology is not clear.     No current facility-administered medications for this encounter.    Current Outpatient Medications  Medication Sig Dispense Refill  . acetaminophen (TYLENOL) 500 MG tablet Take 500-1,000 mg by mouth every 6 (six) hours as needed for headache.     [name] ambrisentan (LETAIRIS) 5 MG tablet Take 1 tablet (5 mg total) by mouth daily. 90 tablet 3  . aspirin EC 81 MG tablet Take 1 tablet (81 mg total) by mouth daily.    . bisoprolol (ZEBETA) 5 MG tablet Take 0.5 tablets (2.5 mg total) by mouth daily. 45 tablet 3  . Calcium Carb-Cholecalciferol (CALCIUM + D3) 600-200 MG-UNIT TABS Take 1 tablet by mouth 2 (two)  times daily with a meal.    . levothyroxine (SYNTHROID) 75 MCG tablet TAKE 1 TABLET (75 MCG TOTAL) BY MOUTH DAILY BEFORE BREAKFAST. 90 tablet 2  . Magnesium Oxide 250 MG TABS Take 1 tablet by mouth daily with breakfast.    . Multiple Vitamin (MULTIVITAMIN WITH MINERALS) TABS tablet Take 1 tablet by mouth daily.    . mycophenolate (MYFORTIC) 360 MG TBEC EC tablet Take 720 mg by mouth 2 (two) times daily.     . nitroGLYCERIN (NITROSTAT) 0.4 MG SL tablet Place 0.4 mg under the tongue every 5 (five) minutes x 3 doses as needed for chest pain.     . pantoprazole (PROTONIX) 20 MG tablet Take 20 mg by mouth See admin instructions. Take 1 tablet (20 mg) by mouth scheduled each morning, may take an additional dose in the evening if needed for heartburn/indigestion.    . potassium chloride (K-DUR) 10 MEQ tablet Take 6 tablets (60 mEq total) by mouth every morning AND 4 tablets (40 mEq total) every evening. 300 tablet 3  . Selexipag (UPTRAVI) 1200 MCG TABS Take 1,200 mcg by mouth every 12 (twelve) hours. 60 tablet 11  . temazepam (RESTORIL) 15 MG capsule Take 1 capsule (15 mg total) by mouth at bedtime as needed for sleep. 30 capsule 4  . torsemide (DEMADEX) 20 MG tablet 80 mg in the AM and 40 mg in the PM for 5 days, then 60 mg in the AM and 40 mg in the PM thereafter (Patient taking differently: Take 20-40 mg by mouth See admin instructions. 40mg  in the morning and 20mg  in the evening) 360 tablet 3  . triamcinolone ointment (KENALOG) 0.1 % Apply 1 application topically 2 (two) times daily as needed (dry/irritated skin.). APPLY AS DIRECTED TO ITCHING SPOTS ON THE BODY DAILY TO TWICE DAILY AS NEEDED. NOT TO FACE.     [name] UNABLE TO FIND Overnight Pulse Oximetry (to evaluate the need for continued nocturnal oxygen)  DX I27.20 1 each 0  . Vitamin D, Ergocalciferol, (DRISDOL) 1.25 MG (50000 UT) CAPS capsule Take 1 capsule (50,000 Units total) by mouth every 7 (seven) days. (Patient taking differently: Take 50,000 Units  by mouth every Thursday. ) 12 capsule 1  . tadalafil, PAH, (ADCIRCA) 20 MG tablet Take 1 tablet (20 mg total) by mouth daily. 30 tablet 6   Facility-Administered Medications Ordered in Other Encounters  Medication Dose Route Frequency Provider Last Rate Last Dose  . calcium gluconate 1 g/ 50 mL sodium chloride IVPB  1 g Intravenous Once Zierle-Ghosh, Asia B, DO 50 mL/hr at 04/22/19 2226 1,000 mg at 04/22/19 2226  . [START ON 04/23/2019] insulin aspart (novoLOG) injection 0-15 Units  0-15 Units Subcutaneous TID WC Zierle-Ghosh, Asia B, DO        Allergies:   Peanut oil, Potassium iodide, Ace inhibitors, Doxycycline, Peanut-containing drug products, Amlodipine, Codeine, Latex, and Penicillins   Social History:  The patient  reports that she has never smoked. She has never used smokeless tobacco. She reports that she does not drink alcohol or use drugs.   Family History:  The patient's family history includes Arthritis in her father, mother, and sister; Bleeding Disorder in her sister; Breast cancer in her sister; CAD in her mother; Cancer in her father; Colon cancer in her maternal grandfather; Depression in her father, mother, and sister; Diabetes in her father and mother; Hypertension in her father, mother, and sister; Stroke in her father and maternal aunt; Thyroid disease in her sister.   ROS:  Please see the history of present illness.   All other systems are personally reviewed and negative.   Exam:   BP 130/84   Pulse 77   Wt 87 kg (191 lb 12.8 oz)   LMP 10/31/2002   SpO2 96%   BMI 37.46 kg/m  General: NAD Neck: No JVD, no thyromegaly or thyroid nodule.  Lungs: Clear to auscultation bilaterally with normal respiratory effort. CV: Nondisplaced PMI.  Heart regular S1/S2, no S3/S4, no murmur.  No peripheral edema.  No carotid bruit.  Normal pedal pulses.  Abdomen: Soft, nontender, no hepatosplenomegaly, no distention.  Skin: Intact without lesions or rashes.  Neurologic: Alert and  oriented x 3.  Psych: Normal affect. Extremities: No clubbing or cyanosis.  HEENT: Normal.   Recent Labs: 03/30/2019: TSH 2.437 04/09/2019: B Natriuretic Peptide 494.5 04/22/2019: ALT 16; BUN 41; Creatinine, Ser 1.55; Hemoglobin 11.3; Magnesium 2.3; Platelets 372; Potassium 3.1; Sodium 138  Personally reviewed   Wt Readings from Last 3 Encounters:  04/22/19 86.6 kg (191 lb)  04/22/19 87 kg (191 lb 12.8 oz)  04/10/19 85.7 kg (189 lb)      ASSESSMENT AND PLAN:  1.  Pulmonary hypertension: Severe PAH with preserved cardiac output by cath in 3/20 (systemic pressure).  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  Has been known for several years.  In 2017, V/Q study was negative for chronic PE and sleep study was negative.  PFTs suggestive of restriction, has mild ILD on CT. Echo in 1/20 showed moderately dilated RV with moderately decreased systolic function.  She is on ambrisentan 5 mg daily, developed significant edema at 10 mg daily.  Malvin Johns has been increased to 1200 mcg bid, she cannot tolerate higher dose.  On exam today, volume status is significantly improved.  Symptomatically, she says she feels better.  However, 6 minute walk is less than in 7/20 (may be due to leg cramps).  - Continue ambrisentan 5 mg daily, will not increase.  - Continue selexipag 1200 mcg bid, cannot tolerate higher dose.  - With volume status improved, I will start her on tadalafil 20 mg daily.  - Continue torsemide 60 qam/40 qpm.  Need to check BMET again today (creatinine has been higher on higher torsemide dose).  - Will see how she does with aggressive treatment, may end up needing parenteral therapy.  - Continue oxygen with exertion.  2. Scleroderma: Followed by [facility].  She is on Mycophenolate.  She has mild interstitial lung disease, pulmonary hypertension, and Raynauds phenomenon.  3. Interstitial lung disease: Stable on CT in 8/20, PFTs suggest restriction.  Likely related  to scleroderma.  4. PFO: Noted by bubble study in past.  5. Long QT syndrome: Has been followed by EP in the past, she is back on a beta blocker (bisoprolol).  Hypocalcemia potentiates her long QT.  No palpitations or syncope.  - Continue  bisoprolol. 6. Hypocalcemia: Recent admission with hypocalcemia, etiology unclear.  Will recheck Ca2+ on BMET today.  She follows for this with Dr. Dorris Fetch.   Followup in 1 months.   Signed, Loralie Champagne, MD  04/22/2019  [place] [address] [facility] [place] [address] [phone] (office) [phone] (fax)

## 2019-04-22 NOTE — Progress Notes (Signed)
6 minute walk: Ambulated 380 feet (115 meters) Pt stopped at 3 minutes and 30 seconds due to fatigue, sob and leg cramps.  HR range 63-103 O2 1L Zinc- 99-100%.   Post wallk pt reported feeling better.  HR 61 O2 100%

## 2019-04-22 NOTE — H&P (Signed)
TRH H&P    Patient Demographics:    Sabrina [name], is a 58 y.o. female  MRN: [id number]  DOB - 1961-06-02  Admit Date - 04/22/2019  Referring MD/NP/PA: PA Idol  Outpatient Primary MD for the patient is Fayrene Helper, MD  Patient coming from: Home  Chief complaint- Hypocalcemia   HPI:    Sabrina [name]  is a 58 y.o. female, with extensive history of connective tissue disease, scleroderma, hypertension, pulmonary hypertension, chronic diastolic heart failure, interstitial lung disease, diabetes without complication among others, presented to the ED today at the direction of Dr. Dorris Fetch - her endocrinologist. Patient reports that she had been to cardiologist and had blood work done. Th endocrinologist saw the results and asked patient to come in for hypocalcemia. Patient was admitted on August 14th for the same thing. Since then, she has followed with her specialists, and reports that she is taking her Calcium supplementation as prescribed. Patient reports that at her last admission she was feeling "electricity" in her head, having muscle twitching, and shortness of breath. This time she reports that she was completely asymptomatic except for an occasional muscle cramp in her leg.   Endocrinologist follows patient for hypocalcemia/Vit D deficiency, hypothyroidism, and goiter. This provider has checked PTH on 03/30/19 and it was normal at 51. 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 They have continued patient on Calcium carbonate and Vit D. They have also had her on mag ox 250 daily.  Patient [facility] with [facility]um at [facility]. She is on Mycophenylate.  Patient has long QT syndrome, and PCP reports that she is on beta blocker for this reason per recent note.   ED course: Blood work shows a mild leukocytosis at 11.2, mild anemia at hgb 11.2 - known iron deficiency anemia. Hypokalemia at 3.1. CKD  III with a GFR of 37.  EKG shows a QT of 531, and NSR rate of 65.       Review of systems:    In addition to the HPI above,  No Fever-chills, No Headache, No changes with Vision or hearing, No problems swallowing food or Liquids, No Chest pain, Cough or Shortness of Breath, No Abdominal pain, No Nausea or Vomiting, bowel movements are regular, No Blood in stool or Urine, Patient does report urinary frequency No new skin rashes or bruises, No new joints pains-aches,  No new weakness, tingling, numbness in any extremity, No recent weight gain or loss, No  polydypsia or polyphagia, No significant Mental Stressors.  All other systems reviewed and are negative.    Past History of the following :    Past Medical History:  Diagnosis Date  . Allergy   . Anemia   . Arthritis   . Asthma   . Chronic bronchitis ([place])   . Connective tissue disease ([place])   . Diabetes mellitus   . Endometriosis   . Fibroid uterus   . GERD (gastroesophageal reflux disease)   . History of Doppler ultrasound    a. Carotid US 8/08:Estimated stenosis in the right and  left internal carotid arteries is 0-50% and 0-50%.  . History of echocardiogram    a. Echo 6/16: EF 60-65%, no RWMA.  . History of hiatal hernia   . History of nuclear stress test    a. Myoview 6/16: There is no resting or stress perfusion defect consistent with no prior infarct and no ischemia. EF 73 %  The patient was hypertensive prior and during the study. Cardiac cath 04/2016 showed normal coronary arteries  . Hx of iron deficiency anemia   . Hypersomnia with sleep apnea   . Hypertension   . Lupus ([place]) 07/2013   Of Skin  . Menorrhagia   . Obesity   . Pneumonia   . 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 Secondary hyperthyroidism   . Thyroid disease   . Tinnitus    left ear  . Tubular adenoma of colon   . Vertigo   . Vitamin D deficiency       Past Surgical History:  Procedure Laterality Date  . ABDOMINAL HYSTERECTOMY  2004    supracervical hysterectomy  . BREAST SURGERY  2005   reduction  . BREATH TEK H PYLORI [name] 12/24/2014   Procedure: BREATH TEK H PYLORI;  Surgeon: Greer Pickerel, MD;  Location: [facility];  Service: General;  Laterality: [name];  . CARDIAC CATHETERIZATION [name] 03/22/2016   Procedure: Right/Left Heart Cath and Coronary Angiography;  Surgeon: Troy Sine, MD;  Location: [facility];  Service: Cardiovascular;  Laterality: [name];  . DILATION AND CURETTAGE OF UTERUS     age 70  . ESOPHAGOGASTRODUODENOSCOPY (EGD) WITH PROPOFOL [name] 10/14/2015   Procedure: ESOPHAGOGASTRODUODENOSCOPY (EGD) WITH PROPOFOL;  Surgeon: Clarene Essex, MD;  Location: [facility];  Service: Endoscopy;  Laterality: [name];  . LAPAROSCOPIC GASTRIC SLEEVE RESECTION WITH HIATAL HERNIA REPAIR [name] 07/05/2015   Procedure: LAPAROSCOPIC GASTRIC SLEEVE RESECTION WITH HIATAL HERNIA REPAIR, upper endoscopy;  Surgeon: Greer Pickerel, MD;  Location: [facility];  Service: General;  Laterality: [name];  . REDUCTION MAMMAPLASTY Bilateral   . RIGHT HEART CATH [name] 10/13/2018   Procedure: RIGHT HEART CATH;  Surgeon: Larey Dresser, MD;  Location: [facility];  Service: Cardiovascular;  Laterality: [name];      Social History:      Social History   Tobacco Use  . Smoking status: Never Smoker  . Smokeless tobacco: Never Used  Substance Use Topics  . Alcohol use: No    Alcohol/week: 0.0 standard drinks       Family History :     Family History  Problem Relation Age of Onset  . Hypertension Mother   . Diabetes Mother   . Arthritis Mother   . Depression Mother   . CAD Mother        s/p PCI  . Diabetes Father   . Stroke Father   . Arthritis Father   . Depression Father   . Hypertension Father   . Cancer Father        prostate  . Hypertension Sister        x 2  . Breast cancer Sister   . Arthritis Sister   . Bleeding Disorder Sister   . Depression Sister   . Thyroid disease Sister   . Colon cancer Maternal Grandfather   .  Stroke Maternal Aunt   . Heart attack Neg Hx       Home Medications:   Prior to Admission medications   Medication Sig Start Date End Date Taking? Authorizing Provider  acetaminophen (  TYLENOL) 500 MG tablet Take 500-1,000 mg by mouth every 6 (six) hours as needed for headache.    Yes [provider]  ambrisentan (LETAIRIS) 5 MG tablet Take 1 tablet (5 mg total) by mouth daily. 12/08/18  Yes Larey Dresser, MD  aspirin EC 81 MG tablet Take 1 tablet (81 mg total) by mouth daily. 03/20/16  Yes Weaver, Scott T, PA-C  bisoprolol (ZEBETA) 5 MG tablet Take 0.5 tablets (2.5 mg total) by mouth daily. 03/12/19  Yes Larey Dresser, MD  Calcium Carb-Cholecalciferol (CALCIUM + D3) 600-200 MG-UNIT TABS Take 1 tablet by mouth 2 (two) times daily with a meal.   Yes [provider]  levothyroxine (SYNTHROID) 75 MCG tablet TAKE 1 TABLET (75 MCG TOTAL) BY MOUTH DAILY BEFORE BREAKFAST. 03/03/19  Yes Nida, Marella Chimes, MD  Magnesium Oxide 250 MG TABS Take 1 tablet by mouth daily with breakfast.   Yes [provider]  Multiple Vitamin (MULTIVITAMIN WITH MINERALS) TABS tablet Take 1 tablet by mouth daily.   Yes [provider]  mycophenolate (MYFORTIC) 360 MG TBEC EC tablet Take 720 mg by mouth 2 (two) times daily.  04/04/18  Yes [provider]  nitroGLYCERIN (NITROSTAT) 0.4 MG SL tablet Place 0.4 mg under the tongue every 5 (five) minutes x 3 doses as needed for chest pain.    Yes [provider]  pantoprazole (PROTONIX) 20 MG tablet Take 20 mg by mouth See admin instructions. Take 1 tablet (20 mg) by mouth scheduled each morning, may take an additional dose in the evening if needed for heartburn/indigestion. 10/01/18  Yes [provider]  potassium chloride (K-DUR) 10 MEQ tablet Take 6 tablets (60 mEq total) by mouth every morning AND 4 tablets (40 mEq total) every evening. 04/09/19  Yes Larey Dresser, MD  Selexipag (UPTRAVI) 1200 MCG TABS Take 1,200  mcg by mouth every 12 (twelve) hours. 01/22/19  Yes Larey Dresser, MD  temazepam (RESTORIL) 15 MG capsule Take 1 capsule (15 mg total) by mouth at bedtime as needed for sleep. 03/10/19  Yes Fayrene Helper, MD  torsemide (DEMADEX) 20 MG tablet 80 mg in the AM and 40 mg in the PM for 5 days, then 60 mg in the AM and 40 mg in the PM thereafter Patient taking differently: Take 20-40 mg by mouth See admin instructions. 40mg  in the morning and 20mg  in the evening 04/09/19  Yes Larey Dresser, MD  triamcinolone ointment (KENALOG) 0.1 % Apply 1 application topically 2 (two) times daily as needed (dry/irritated skin.). APPLY AS DIRECTED TO ITCHING SPOTS ON THE BODY DAILY TO TWICE DAILY AS NEEDED. NOT TO FACE.    Yes [provider]  Vitamin D, Ergocalciferol, (DRISDOL) 1.25 MG (50000 UT) CAPS capsule Take 1 capsule (50,000 Units total) by mouth every 7 (seven) days. Patient taking differently: Take 50,000 Units by mouth every Thursday.  04/10/19  Yes Perlie Mayo, NP  tadalafil, PAH, (ADCIRCA) 20 MG tablet Take 1 tablet (20 mg total) by mouth daily. 04/22/19   Larey Dresser, MD  UNABLE TO FIND Overnight Pulse Oximetry (to evaluate the need for continued nocturnal oxygen)  DX I27.20 09/17/18   Fayrene Helper, MD  potassium chloride (K-DUR) 10 MEQ tablet Take 2 tablets by mouth twice daily 12/01/18 04/01/19  Fayrene Helper, MD     Allergies:     Allergies  Allergen Reactions  . Peanut Oil Swelling  . Potassium Iodide Other (See Comments)  facial swelling, also eyes, and ears  . Ace Inhibitors Cough  . Doxycycline Hives  . Peanut-Containing Drug Products Nausea And Vomiting  . Amlodipine Rash  . Codeine Nausea And Vomiting  . Latex Hives and Rash  . Penicillins Hives, Itching and Rash    .Marland KitchenHas patient had a PCN reaction causing immediate rash, facial/tongue/throat swelling, SOB or lightheadedness with hypotension: No Has patient had a PCN reaction causing severe rash  involving mucus membranes or skin necrosis: No Has patient had a PCN reaction that required hospitalization No Has patient had a PCN reaction occurring within the last 10 years: YES (3 years ago)  If all of the above answers are "NO", then may proceed with Cephalosporin use.      Physical Exam:   Vitals  Blood pressure 103/68, pulse 63, temperature 98.3 F (36.8 C), temperature source Oral, resp. rate (!) 22, height 5' (1.524 m), weight 86.6 kg, last menstrual period 10/31/2002, SpO2 97 %.  1.  General:  Lying right lateral decubitus in bed in no acute distress  2. Psychiatric: A &O x3, pleasant and cooperative with exam  3. Neurologic: Cranial nerves II-XII grossly intact. No focal deficits on exam  4. HEENMT:  Head is atraumatic, normocephalic  5. Respiratory : LCTABL  6. Cardiovascular : HRRR no murmurs, rubs or gallops  7. Gastrointestinal:  Soft, non distended, non tender to palpation  8. Skin:  Hyperpigmentation of chest with hypopigmented streaks  9.Musculoskeletal:  No peripheral edema    Data Review:    CBC Recent Labs  Lab 04/22/19 1822  WBC 11.2*  HGB 11.3*  HCT 37.7  PLT 372  MCV 81.6  MCH 24.5*  MCHC 30.0  RDW 16.4*  LYMPHSABS 2.2  MONOABS 0.7  EOSABS 0.3  BASOSABS 0.0   ------------------------------------------------------------------------------------------------------------------  Results for orders placed or performed during the hospital encounter of 04/22/19 (from the past 48 hour(s))  CBC with Differential     Status: Abnormal   Collection Time: 04/22/19  6:22 PM  Result Value Ref Range   WBC 11.2 (H) 4.0 - 10.5 K/uL   RBC 4.62 3.87 - 5.11 MIL/uL   Hemoglobin 11.3 (L) 12.0 - 15.0 g/dL   HCT 37.7 36.0 - 46.0 %   MCV 81.6 80.0 - 100.0 fL   MCH 24.5 (L) 26.0 - 34.0 pg   MCHC 30.0 30.0 - 36.0 g/dL   RDW 16.4 (H) 11.5 - 15.5 %   Platelets 372 150 - 400 K/uL   nRBC 0.0 0.0 - 0.2 %   Neutrophils Relative % 69 %   Neutro Abs  7.8 (H) 1.7 - 7.7 K/uL   Lymphocytes Relative 20 %   Lymphs Abs 2.2 0.7 - 4.0 K/uL   Monocytes Relative 7 %   Monocytes Absolute 0.7 0.1 - 1.0 K/uL   Eosinophils Relative 3 %   Eosinophils Absolute 0.3 0.0 - 0.5 K/uL   Basophils Relative 0 %   Basophils Absolute 0.0 0.0 - 0.1 K/uL   Immature Granulocytes 1 %   Abs Immature Granulocytes 0.06 0.00 - 0.07 K/uL    Comment: Performed at [facility], [address] [place], [place] [address]  Magnesium     Status: None   Collection Time: 04/22/19  6:22 PM  Result Value Ref Range   Magnesium 2.3 1.7 - 2.4 mg/dL    Comment: Performed at [facility], [address], [place], [place] [id number]  Basic metabolic panel     Status: Abnormal   Collection  Time: 04/22/19  6:22 PM  Result Value Ref Range   Sodium 138 135 - 145 mmol/L   Potassium 3.1 (L) 3.5 - 5.1 mmol/L   Chloride 97 (L) 98 - 111 mmol/L   CO2 27 22 - 32 mmol/L   Glucose, Bld 89 70 - 99 mg/dL   BUN 41 (H) 6 - 20 mg/dL   Creatinine, Ser 1.55 (H) 0.44 - 1.00 mg/dL   Calcium 6.1 (LL) 8.9 - 10.3 mg/dL    Comment: CRITICAL RESULT CALLED TO, READ BACK BY AND VERIFIED WITH: CRAWFORD,H ON 04/22/19 AT 1940 BY LOY,C    GFR calc non Af Amer 37 (L) >60 mL/min   GFR calc Af Amer 43 (L) >60 mL/min   Anion gap 14 5 - 15    Comment: Performed at [facility], [address] [place], [place] [address]  Hepatic function panel     Status: None   Collection Time: 04/22/19  6:22 PM  Result Value Ref Range   Total Protein 8.0 6.5 - 8.1 g/dL   Albumin 4.0 3.5 - 5.0 g/dL   AST 18 15 - 41 U/L   ALT 16 0 - 44 U/L   Alkaline Phosphatase 62 38 - 126 U/L   Total Bilirubin 0.5 0.3 - 1.2 mg/dL   Bilirubin, Direct 0.1 0.0 - 0.2 mg/dL   Indirect Bilirubin 0.4 0.3 - 0.9 mg/dL    Comment: Performed at [facility], [address] [place],  [address]  SARS Coronavirus 2 [facility] order, Performed in [facility] hospital lab) Nasopharyngeal Nasopharyngeal Swab     Status: None   Collection Time:  04/22/19  8:14 PM   Specimen: Nasopharyngeal Swab  Result Value Ref Range   SARS Coronavirus 2 NEGATIVE NEGATIVE    Comment: (NOTE) If result is NEGATIVE SARS-CoV-2 target nucleic acids are NOT DETECTED. The SARS-CoV-2 RNA is generally detectable in upper and lower  respiratory specimens during the acute phase of infection. The lowest  concentration of SARS-CoV-2 viral copies this assay can detect is 250  copies / mL. A negative result does not preclude SARS-CoV-2 infection  and should not be used as the sole basis for treatment or other  patient management decisions.  A negative result may occur with  improper specimen collection / handling, submission of specimen other  than nasopharyngeal swab, presence of viral mutation(s) within the  areas targeted by this assay, and inadequate number of viral copies  (<250 copies / mL). A negative result must be combined with clinical  observations, patient history, and epidemiological information. If result is POSITIVE SARS-CoV-2 target nucleic acids are DETECTED. The SARS-CoV-2 RNA is generally detectable in upper and lower  respiratory specimens dur ing the acute phase of infection.  Positive  results are indicative of active infection with SARS-CoV-2.  Clinical  correlation with patient history and other diagnostic information is  necessary to determine patient infection status.  Positive results do  not rule out bacterial infection or co-infection with other viruses. If result is PRESUMPTIVE POSTIVE SARS-CoV-2 nucleic acids MAY BE PRESENT.   A presumptive positive result was obtained on the submitted specimen  and confirmed on repeat testing.  While 2019 novel coronavirus  (SARS-CoV-2) nucleic acids may be present in the submitted sample  additional confirmatory testing may be necessary for epidemiological  and / or clinical management purposes  to differentiate between  SARS-CoV-2 and other Sarbecovirus currently known to infect humans.   If clinically indicated additional testing with an alternate test  methodology [phone]) is advised. The SARS-CoV-2 RNA is generally  detectable in upper and lower respiratory sp ecimens during the acute  phase of infection. The expected result is Negative. Fact Sheet for Patients:  StrictlyIdeas.no Fact Sheet for Healthcare Providers: BankingDealers.co.za This test is not yet approved or cleared by the [place] FDA and has been authorized for detection and/or diagnosis of SARS-CoV-2 by FDA under an Emergency Use Authorization (EUA).  This EUA will remain in effect (meaning this test can be used) for the duration of the COVID-19 declaration under Section 564(b)(1) of the Act, 21 U.S.C. section 360bbb-3(b)(1), unless the authorization is terminated or revoked sooner. Performed at [facility], [address], [place], [place] [address]     Chemistries  Recent Labs  Lab 04/22/19 1116 04/22/19 1822  NA 139 138  K 3.5 3.1*  CL 102 97*  CO2 24 27  GLUCOSE 102* 89  BUN 37* 41*  CREATININE 1.80* 1.55*  CALCIUM 6.1* 6.1*  MG  --  2.3  AST  --  18  ALT  --  16  ALKPHOS  --  62  BILITOT  --  0.5   ------------------------------------------------------------------------------------------------------------------  ------------------------------------------------------------------------------------------------------------------ GFR: Estimated Creatinine Clearance: 40.1 mL/min (A) (by C-G formula based on SCr of 1.55 mg/dL (H)). Liver Function Tests: Recent Labs  Lab 04/22/19 1822  AST 18  ALT 16  ALKPHOS 62  BILITOT 0.5  PROT 8.0  ALBUMIN 4.0   No results for input(s): LIPASE, AMYLASE in the last 168 hours. No results for input(s): AMMONIA in the last 168 hours. Coagulation Profile: No results for input(s): INR, PROTIME in the last 168 hours. Cardiac Enzymes: No results for input(s): CKTOTAL, CKMB, CKMBINDEX, TROPONINI  in the last 168 hours. BNP (last 3 results) No results for input(s): PROBNP in the last 8760 hours. HbA1C: No results for input(s): HGBA1C in the last 72 hours. CBG: No results for input(s): GLUCAP in the last 168 hours. Lipid Profile: No results for input(s): CHOL, HDL, LDLCALC, TRIG, CHOLHDL, LDLDIRECT in the last 72 hours. Thyroid Function Tests: No results for input(s): TSH, T4TOTAL, FREET4, T3FREE, THYROIDAB in the last 72 hours. Anemia Panel: No results for input(s): VITAMINB12, FOLATE, FERRITIN, TIBC, IRON, RETICCTPCT in the last 72 hours.  --------------------------------------------------------------------------------------------------------------- Urine analysis:    Component Value Date/Time   COLORURINE YELLOW 07/30/2016 1837   APPEARANCEUR CLEAR 07/30/2016 1837   LABSPEC 1.020 07/30/2016 1837   PHURINE 7.0 07/30/2016 1837   GLUCOSEU NEGATIVE 07/30/2016 1837   HGBUR NEGATIVE 07/30/2016 1837   BILIRUBINUR NEGATIVE 07/30/2016 1837   KETONESUR NEGATIVE 07/30/2016 1837   PROTEINUR 100 (A) 07/30/2016 1837   UROBILINOGEN 0.2 10/13/2014 2352   NITRITE NEGATIVE 07/30/2016 1837   LEUKOCYTESUR NEGATIVE 07/30/2016 1837      Imaging Results:    No results found.     Assessment & Plan:    Active Problems:   Hypocalcemia   1. Hypocalcemia 1. Normal albumin - no correction calculation required 2. Serum calcium is 6.1 3. Ionized calcium is gold standard - pending 4. No tetany, carpopedal symptoms, paresthesias, or seizures 5. Prolonged QT at 531 6. 1g calcium gluconate given in ED equivalent to 90mg  elemental calcium 7. 1 more g Calcium gluconate ordered 8. Continue home doses of Calcium and Vit D 9. Magnesium level is normal 10. Hold diuretics 11. Repeat calcium level q 6 hours 2. QT prolongation 1. 2/2 hypocalcemia 2. See plan above 3. Monitor on Tele 3. Hypokalemia 1. Replace and recheck 4. Pulm  HTN 1. Continue home medications 2. Pulse ox with vitals  5. Scleroderma 1. Continue home medication 6. Hypothyroidism 1. Last TSH less than 6 weeks ago 2. Continue current dose of synthroid 7. DMII 1. Sliding scale 2. Glucose in ED is normal 8. CKD III 1. Hold nephrotoxic agents when possible 2. Referred to nephro outpatient by endo    DVT Prophylaxis-   Heparin- SCDs   AM Labs Ordered, also please review Full Orders  Family Communication: Admission, patients condition and plan of care including tests being ordered have been discussed with the patient and husband who indicate understanding and agree with the plan and Code Status.  Code Status:  Full  Admission status: Observation: Based on patients clinical presentation and evaluation of above clinical data, I have made determination that patient meets Inpatient criteria at this time. Time spent in minutes : 65   Rolla Plate M.D on 04/22/2019 at 9:44 PM

## 2019-04-22 NOTE — Progress Notes (Signed)
  Echocardiogram 2D Echocardiogram has been performed.  Jennette Dubin 04/22/2019, 9:50 AM

## 2019-04-23 DIAGNOSIS — J45909 Unspecified asthma, uncomplicated: Secondary | ICD-10-CM | POA: Diagnosis not present

## 2019-04-23 DIAGNOSIS — I110 Hypertensive heart disease with heart failure: Secondary | ICD-10-CM | POA: Diagnosis not present

## 2019-04-23 DIAGNOSIS — E119 Type 2 diabetes mellitus without complications: Secondary | ICD-10-CM | POA: Diagnosis not present

## 2019-04-23 LAB — CBC
HCT: 35.4 % — ABNORMAL LOW (ref 36.0–46.0)
Hemoglobin: 10.5 g/dL — ABNORMAL LOW (ref 12.0–15.0)
MCH: 24.2 pg — ABNORMAL LOW (ref 26.0–34.0)
MCHC: 29.7 g/dL — ABNORMAL LOW (ref 30.0–36.0)
MCV: 81.8 fL (ref 80.0–100.0)
Platelets: 280 10*3/uL (ref 150–400)
RBC: 4.33 MIL/uL (ref 3.87–5.11)
RDW: 16.3 % — ABNORMAL HIGH (ref 11.5–15.5)
WBC: 9.8 10*3/uL (ref 4.0–10.5)
nRBC: 0 % (ref 0.0–0.2)

## 2019-04-23 LAB — COMPREHENSIVE METABOLIC PANEL
ALT: 15 U/L (ref 0–44)
AST: 15 U/L (ref 15–41)
Albumin: 3.5 g/dL (ref 3.5–5.0)
Alkaline Phosphatase: 53 U/L (ref 38–126)
Anion gap: 12 (ref 5–15)
BUN: 37 mg/dL — ABNORMAL HIGH (ref 6–20)
CO2: 28 mmol/L (ref 22–32)
Calcium: 6.6 mg/dL — ABNORMAL LOW (ref 8.9–10.3)
Chloride: 102 mmol/L (ref 98–111)
Creatinine, Ser: 1.48 mg/dL — ABNORMAL HIGH (ref 0.44–1.00)
GFR calc Af Amer: 46 mL/min — ABNORMAL LOW (ref >60)
GFR calc non Af Amer: 39 mL/min — ABNORMAL LOW (ref >60)
Glucose, Bld: 88 mg/dL (ref 70–99)
Potassium: 3.4 mmol/L — ABNORMAL LOW (ref 3.5–5.1)
Sodium: 142 mmol/L (ref 135–145)
Total Bilirubin: 0.6 mg/dL (ref 0.3–1.2)
Total Protein: 7 g/dL (ref 6.5–8.1)

## 2019-04-23 LAB — MAGNESIUM: Magnesium: 2.2 mg/dL (ref 1.7–2.4)

## 2019-04-23 LAB — PHOSPHORUS: Phosphorus: 7.3 mg/dL — ABNORMAL HIGH (ref 2.5–4.6)

## 2019-04-23 LAB — GLUCOSE, CAPILLARY: Glucose-Capillary: 90 mg/dL (ref 70–99)

## 2019-04-23 MED ORDER — ASPIRIN EC 81 MG PO TBEC: 81.0000 mg | DELAYED_RELEASE_TABLET | Freq: Every day | ORAL | 2 refills | Status: AC

## 2019-04-23 MED ORDER — LEVOTHYROXINE SODIUM 75 MCG PO TABS
75.0000 ug | ORAL_TABLET | Freq: Every day | ORAL | Status: DC
  Administered 2019-04-23: 75 ug via ORAL
  Filled 2019-04-23: qty 1

## 2019-04-23 MED ORDER — BISOPROLOL FUMARATE 5 MG PO TABS
2.5000 mg | ORAL_TABLET | Freq: Every day | ORAL | Status: DC
  Administered 2019-04-23: 09:00:00 2.5 mg via ORAL
  Filled 2019-04-23: qty 1

## 2019-04-23 MED ORDER — ACETAMINOPHEN 650 MG RE SUPP: 650.0000 mg | Freq: Four times a day (QID) | RECTAL | Status: DC | PRN

## 2019-04-23 MED ORDER — NITROGLYCERIN 0.4 MG SL SUBL: 0.4000 mg | SUBLINGUAL_TABLET | SUBLINGUAL | Status: DC | PRN

## 2019-04-23 MED ORDER — HEPARIN SODIUM (PORCINE) 5000 UNIT/ML IJ SOLN
5000.0000 [IU] | Freq: Three times a day (TID) | INTRAMUSCULAR | Status: DC
  Administered 2019-04-23 (×2): 5000 [IU] via SUBCUTANEOUS
  Filled 2019-04-23 (×3): qty 1

## 2019-04-23 MED ORDER — CALCIUM CARBONATE 1250 (500 CA) MG PO TABS: 2.0000 | ORAL_TABLET | Freq: Three times a day (TID) | ORAL | 2 refills | Status: DC

## 2019-04-23 MED ORDER — CALCIUM CARBONATE 1250 (500 CA) MG PO TABS
2.0000 | ORAL_TABLET | Freq: Three times a day (TID) | ORAL | Status: DC
  Administered 2019-04-23: 1000 mg via ORAL
  Filled 2019-04-23: qty 1

## 2019-04-23 MED ORDER — ACETAMINOPHEN 325 MG PO TABS: 650.0000 mg | ORAL_TABLET | Freq: Four times a day (QID) | ORAL | 0 refills | Status: DC | PRN

## 2019-04-23 MED ORDER — MAGNESIUM OXIDE 400 (241.3 MG) MG PO TABS
200.0000 mg | ORAL_TABLET | Freq: Every day | ORAL | Status: DC
  Administered 2019-04-23: 200 mg via ORAL
  Filled 2019-04-23: qty 0.5
  Filled 2019-04-23: qty 1
  Filled 2019-04-23: qty 0.5

## 2019-04-23 MED ORDER — ADULT MULTIVITAMIN W/MINERALS CH
1.0000 | ORAL_TABLET | Freq: Every day | ORAL | Status: DC
  Administered 2019-04-23: 1 via ORAL
  Filled 2019-04-23: qty 1

## 2019-04-23 MED ORDER — SENNOSIDES-DOCUSATE SODIUM 8.6-50 MG PO TABS: 1.0000 | ORAL_TABLET | Freq: Every evening | ORAL | Status: DC | PRN

## 2019-04-23 MED ORDER — VITAMIN D (ERGOCALCIFEROL) 1.25 MG (50000 UNIT) PO CAPS: 50000.0000 [IU] | ORAL_CAPSULE | ORAL | 3 refills | Status: DC

## 2019-04-23 MED ORDER — VITAMIN D (ERGOCALCIFEROL) 1.25 MG (50000 UNIT) PO CAPS
50000.0000 [IU] | ORAL_CAPSULE | ORAL | Status: DC
  Administered 2019-04-23: 50000 [IU] via ORAL
  Filled 2019-04-23: qty 1

## 2019-04-23 MED ORDER — TEMAZEPAM 15 MG PO CAPS: 15.0000 mg | ORAL_CAPSULE | Freq: Every evening | ORAL | Status: DC | PRN

## 2019-04-23 MED ORDER — MYCOPHENOLATE SODIUM 180 MG PO TBEC
720.0000 mg | DELAYED_RELEASE_TABLET | Freq: Two times a day (BID) | ORAL | Status: DC
  Administered 2019-04-23 (×2): 720 mg via ORAL
  Filled 2019-04-23 (×4): qty 4

## 2019-04-23 MED ORDER — SENNOSIDES-DOCUSATE SODIUM 8.6-50 MG PO TABS: 2.0000 | ORAL_TABLET | Freq: Every day | ORAL | 2 refills | Status: AC

## 2019-04-23 MED ORDER — SELEXIPAG 1200 MCG PO TABS: 1200.0000 ug | ORAL_TABLET | Freq: Two times a day (BID) | ORAL | Status: DC

## 2019-04-23 MED ORDER — TORSEMIDE 20 MG PO TABS: ORAL_TABLET | ORAL | 3 refills | Status: DC

## 2019-04-23 MED ORDER — AMBRISENTAN 5 MG PO TABS
5.0000 mg | ORAL_TABLET | Freq: Every day | ORAL | Status: DC
  Administered 2019-04-23: 5 mg via ORAL
  Filled 2019-04-23 (×2): qty 1

## 2019-04-23 MED ORDER — CALCIUM CARBONATE-VITAMIN D 500-200 MG-UNIT PO TABS
1.0000 | ORAL_TABLET | Freq: Two times a day (BID) | ORAL | Status: DC
  Administered 2019-04-23: 1 via ORAL
  Filled 2019-04-23 (×4): qty 1

## 2019-04-23 MED ORDER — ACETAMINOPHEN 325 MG PO TABS: 650.0000 mg | ORAL_TABLET | Freq: Four times a day (QID) | ORAL | Status: DC | PRN

## 2019-04-23 NOTE — Discharge Instructions (Signed)
1) please take medications as prescribed including calcium carbonate and vitamin D supplements 2) please see Dr. Dorris Fetch your endocrinologist within a week for recheck and reevaluation including blood work

## 2019-04-23 NOTE — Discharge Summary (Signed)
Sabrina [name], is a 58 y.o. female  DOB Dec 16, 1960  MRN [id number].  Admission date:  04/22/2019  Admitting Physician  Rolla Plate, DO  Discharge Date:  04/23/2019   Primary MD  Fayrene Helper, MD  Recommendations for primary care physician for things to follow:   1) please take medications as prescribed including calcium carbonate and vitamin D supplements 2) please see Dr. Dorris Fetch your endocrinologist within a week for recheck and reevaluation including blood work  Admission Diagnosis  Hypocalcemia [E83.51] Hypokalemia [E87.6] QT prolongation [R94.31]   Discharge Diagnosis  Hypocalcemia [E83.51] Hypokalemia [E87.6] QT prolongation [R94.31]    Principal Problem:   Hypocalcemia Active Problems:   Hypothyroidism   Secondary hyperparathyroidism ([place])   Scleroderma ([place])      Past Medical History:  Diagnosis Date  . Allergy   . Anemia   . Arthritis   . Asthma   . Chronic bronchitis ([place])   . Connective tissue disease ([place])   . Diabetes mellitus   . Endometriosis   . Fibroid uterus   . GERD (gastroesophageal reflux disease)   . History of Doppler ultrasound    a. Carotid US 8/08:Estimated stenosis in the right and left internal carotid arteries is 0-50% and 0-50%.  . History of echocardiogram    a. Echo 6/16: EF 60-65%, no RWMA.  . History of hiatal hernia   . History of nuclear stress test    a. Myoview 6/16: There is no resting or stress perfusion defect consistent with no prior infarct and no ischemia. EF 73 %  The patient was hypertensive prior and during the study. Cardiac cath 04/2016 showed normal coronary arteries  . Hx of iron deficiency anemia   . Hypersomnia with sleep apnea   . Hypertension   . Lupus ([place]) 07/2013   Of Skin  . Menorrhagia   . Obesity   . Pneumonia   . 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 Secondary hyperthyroidism   . Thyroid disease    . Tinnitus    left ear  . Tubular adenoma of colon   . Vertigo   . Vitamin D deficiency     Past Surgical History:  Procedure Laterality Date  . ABDOMINAL HYSTERECTOMY  2004   supracervical hysterectomy  . BREAST SURGERY  2005   reduction  . BREATH TEK H PYLORI [name] 12/24/2014   Procedure: BREATH TEK H PYLORI;  Surgeon: Greer Pickerel, MD;  Location: [facility];  Service: General;  Laterality: [name];  . CARDIAC CATHETERIZATION [name] 03/22/2016   Procedure: Right/Left Heart Cath and Coronary Angiography;  Surgeon: Troy Sine, MD;  Location: [facility];  Service: Cardiovascular;  Laterality: [name];  . DILATION AND CURETTAGE OF UTERUS     age 18  . ESOPHAGOGASTRODUODENOSCOPY (EGD) WITH PROPOFOL [name] 10/14/2015   Procedure: ESOPHAGOGASTRODUODENOSCOPY (EGD) WITH PROPOFOL;  Surgeon: Clarene Essex, MD;  Location: [facility];  Service: Endoscopy;  Laterality: [name];  . LAPAROSCOPIC GASTRIC SLEEVE RESECTION WITH HIATAL HERNIA REPAIR [name] 07/05/2015   Procedure:  LAPAROSCOPIC GASTRIC SLEEVE RESECTION WITH HIATAL HERNIA REPAIR, upper endoscopy;  Surgeon: Greer Pickerel, MD;  Location: [facility];  Service: General;  Laterality: [name];  . REDUCTION MAMMAPLASTY Bilateral   . RIGHT HEART CATH [name] 10/13/2018   Procedure: RIGHT HEART CATH;  Surgeon: Larey Dresser, MD;  Location: [facility];  Service: Cardiovascular;  Laterality: [name];       HPI  from the history and physical done on the day of admission:    Sabrina [name]  is a 58 y.o. female,with extensive history of connective tissue disease, scleroderma, hypertension, pulmonary hypertension, chronic diastolic heart failure, interstitial lung disease, diabetes without complication among others, presented to the ED today at the direction of Dr. Dorris Fetch - her endocrinologist. Patient reports that she had been to cardiologist and had blood work done. Th endocrinologist saw the results and asked patient to come in for hypocalcemia. Patient was admitted on  August 14th for the same thing. Since then, she has followed with her specialists, and reports that she is taking her Calcium supplementation as prescribed. Patient reports that at her last admission she was feeling "electricity" in her head, having muscle twitching, and shortness of breath. This time she reports that she was completely asymptomatic except for an occasional muscle cramp in her leg.   Endocrinologist follows patient for hypocalcemia/Vit D deficiency, hypothyroidism, and goiter. This provider has checked PTH on 03/30/19 and it was normal at 51. 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 They have continued patient on Calcium carbonate and Vit D. They have also had her on mag ox 250 daily.  Patient follows with rheum at [facility]. She is on Mycophenylate.  Patient has long QT syndrome, and PCP reports that she is on beta blocker for this reason per recent note.   ED course: Blood work shows a mild leukocytosis at 11.2, mild anemia at hgb 11.2 - known iron deficiency anemia. Hypokalemia at 3.1. CKD III with a GFR of 37.  EKG shows a QT of 531, and NSR rate of 65.     Hospital Course:     1)Hypocalcemia -On admission calcium was 6.1, patient received IV calcium gluconate replacements, corrected calcium now close to 7, discussed with patient's endocrinologist Dr. Vickie Epley to discharge home with calcium carbonate and vitamin D supplements, repeat labs with endocrinologist within a week   2) pulmonary hypertension-- Severe PAH with preserved cardiac output by cath in 3/20 (systemic pressure). Suspect group 1 pulmonary hypertension related to scleroderma -Continue nocturnal oxygen supplementation -Continue ambrisentan 5 mg daily,.  - Continue selexipag 1200 mcg bid, cannot tolerate higher dose.  - c /n  tadalafil 20 mg daily.  - Continue torsemide 60 qam/40 qpm. --Continue to follow-up with heart failure cardiology  3)Scleroderma: Followed by rheumatology at [facility]. She  is on Mycophenolate. She has mild interstitial lung disease, pulmonary hypertension, and Raynauds phenomenon.  4)Hypothyroidism- Last TSH less than 6 weeks ago 1. Continue current dose of synthroid  5)Long QT syndrome: Has been followed by EP in the past, she is back on a beta blocker (bisoprolol).  Hypocalcemia potentiates her long QT.  No palpitations or syncope.  - Continue bisoprolol.  6)PFO: Noted by bubble study in past.    7)CKD III--- creatinine is down to 1.48 from a recent baseline of 1.7-1.8, patient was previously advised to follow-up with nephrology as outpatient    Discharge Condition: stable  Follow UP--Dr. Dorris Fetch endocrinology within a week     Consults obtained -phone  consult with patient's endocrinologist Dr. Dorris Fetch  Diet and Activity recommendation:  As advised  Discharge Instructions     Discharge Instructions    Call MD for:  difficulty breathing, headache or visual disturbances   Complete by: As directed    Call MD for:  persistant dizziness or light-headedness   Complete by: As directed    Call MD for:  persistant nausea and vomiting   Complete by: As directed    Call MD for:  severe uncontrolled pain   Complete by: As directed    Call MD for:  temperature >100.4   Complete by: As directed    Diet - low sodium heart healthy   Complete by: As directed    Discharge instructions   Complete by: As directed    1) please take medications as prescribed including calcium carbonate and vitamin D supplements 2) please see Dr. Dorris Fetch your endocrinologist within a week for recheck and reevaluation including blood work   Increase activity slowly   Complete by: As directed         Discharge Medications     Allergies as of 04/23/2019      Reactions   Peanut Oil Swelling   Potassium Iodide Other (See Comments)   facial swelling, also eyes, and ears   Ace Inhibitors Cough   Doxycycline Hives   Peanut-containing Drug Products Nausea And Vomiting    Amlodipine Rash   Codeine Nausea And Vomiting   Latex Hives, Rash   Penicillins Hives, Itching, Rash   .Marland KitchenHas patient had a PCN reaction causing immediate rash, facial/tongue/throat swelling, SOB or lightheadedness with hypotension: No Has patient had a PCN reaction causing severe rash involving mucus membranes or skin necrosis: No Has patient had a PCN reaction that required hospitalization No Has patient had a PCN reaction occurring within the last 10 years: YES (3 years ago)  If all of the above answers are "NO", then may proceed with Cephalosporin use.      Medication List    STOP taking these medications   Calcium + D3 600-200 MG-UNIT Tabs     TAKE these medications   acetaminophen 325 MG tablet Commonly known as: TYLENOL Take 2 tablets (650 mg total) by mouth every 6 (six) hours as needed for mild pain (or Fever >/= 101). What changed:   medication strength  how much to take  reasons to take this   ambrisentan 5 MG tablet Commonly known as: LETAIRIS Take 1 tablet (5 mg total) by mouth daily.   aspirin EC 81 MG tablet Take 1 tablet (81 mg total) by mouth daily with breakfast. What changed: when to take this   bisoprolol 5 MG tablet Commonly known as: ZEBETA Take 0.5 tablets (2.5 mg total) by mouth daily.   calcium carbonate 1250 (500 Ca) MG tablet Commonly known as: OS-CAL - dosed in mg of elemental calcium Take 2 tablets (1,000 mg of elemental calcium total) by mouth 3 (three) times daily with meals.   levothyroxine 75 MCG tablet Commonly known as: SYNTHROID TAKE 1 TABLET (75 MCG TOTAL) BY MOUTH DAILY BEFORE BREAKFAST.   Magnesium Oxide 250 MG Tabs Take 1 tablet by mouth daily with breakfast.   multivitamin with minerals Tabs tablet Take 1 tablet by mouth daily.   mycophenolate 360 MG Tbec EC tablet Commonly known as: MYFORTIC Take 720 mg by mouth 2 (two) times daily.   nitroGLYCERIN 0.4 MG SL tablet Commonly known as: NITROSTAT Place 0.4 mg under the  tongue every  5 (five) minutes x 3 doses as needed for chest pain.   pantoprazole 20 MG tablet Commonly known as: PROTONIX Take 20 mg by mouth See admin instructions. Take 1 tablet (20 mg) by mouth scheduled each morning, may take an additional dose in the evening if needed for heartburn/indigestion.   potassium chloride 10 MEQ tablet Commonly known as: K-DUR Take 6 tablets (60 mEq total) by mouth every morning AND 4 tablets (40 mEq total) every evening.   senna-docusate 8.6-50 MG tablet Commonly known as: Senokot-S Take 2 tablets by mouth at bedtime.   tadalafil (PAH) 20 MG tablet Commonly known as: Adcirca Take 1 tablet (20 mg total) by mouth daily.   temazepam 15 MG capsule Commonly known as: RESTORIL Take 1 capsule (15 mg total) by mouth at bedtime as needed for sleep.   torsemide 20 MG tablet Commonly known as: DEMADEX 60 mg in the AM and 40 mg in the PM What changed: additional instructions   triamcinolone ointment 0.1 % Commonly known as: KENALOG Apply 1 application topically 2 (two) times daily as needed (dry/irritated skin.). APPLY AS DIRECTED TO ITCHING SPOTS ON THE BODY DAILY TO TWICE DAILY AS NEEDED. NOT TO FACE.   UNABLE TO FIND Overnight Pulse Oximetry (to evaluate the need for continued nocturnal oxygen)  DX I27.20   Uptravi 1200 MCG Tabs Generic drug: Selexipag Take 1,200 mcg by mouth every 12 (twelve) hours.   Vitamin D (Ergocalciferol) 1.25 MG (50000 UT) Caps capsule Commonly known as: DRISDOL Take 1 capsule (50,000 Units total) by mouth every Thursday. Start taking on: April 30, 2019       Major procedures and Radiology Reports - PLEASE review detailed and final reports for all details, in brief -    Dg Chest 2 View  Result Date: 03/28/2019 CLINICAL DATA:  Cough and shortness of breath. EXAM: CHEST - 2 VIEW COMPARISON:  Radiograph 08/19/2018 FINDINGS: The cardiomediastinal contours are unchanged with mild cardiomegaly. Mild vascular congestion  without pulmonary edema. Streaky bibasilar atelectasis or scarring. No consolidation, pleural effusion, or pneumothorax. No acute osseous abnormalities are seen. IMPRESSION: 1. Mild cardiomegaly with vascular congestion. 2. Streaky bibasilar atelectasis or scarring. Electronically Signed   By: Keith Rake M.D.   On: 03/28/2019 01:21   Dg Chest Port 1 View  Result Date: 03/30/2019 CLINICAL DATA:  Pulmonary vasculature congestion, sob on exertion EXAM: PORTABLE CHEST 1 VIEW COMPARISON:  Chest radiographs dated 03/28/2019, 08/19/2018 FINDINGS: Stable cardiomediastinal contours with enlarged heart size. No pneumothorax or pleural effusion. Minimal central venous congestion. No new focal pulmonary infiltrate or evidence of edema. Upper abdomen and visualized skeleton is unremarkable. IMPRESSION: No active cardiopulmonary disease. Electronically Signed   By: Audie Pinto M.D.   On: 03/30/2019 13:36    Micro Results    Recent Results (from the past 240 hour(s))  SARS Coronavirus 2 [facility] order, Performed in [facility] hospital lab) Nasopharyngeal Nasopharyngeal Swab     Status: None   Collection Time: 04/22/19  8:14 PM   Specimen: Nasopharyngeal Swab  Result Value Ref Range Status   SARS Coronavirus 2 NEGATIVE NEGATIVE Final    Comment: (NOTE) If result is NEGATIVE SARS-CoV-2 target nucleic acids are NOT DETECTED. The SARS-CoV-2 RNA is generally detectable in upper and lower  respiratory specimens during the acute phase of infection. The lowest  concentration of SARS-CoV-2 viral copies this assay can detect is 250  copies / mL. A negative result does not preclude SARS-CoV-2 infection  and should not be used  as the sole basis for treatment or other  patient management decisions.  A negative result may occur with  improper specimen collection / handling, submission of specimen other  than nasopharyngeal swab, presence of viral mutation(s) within the  areas targeted by this assay, and  inadequate number of viral copies  (<250 copies / mL). A negative result must be combined with clinical  observations, patient history, and epidemiological information. If result is POSITIVE SARS-CoV-2 target nucleic acids are DETECTED. The SARS-CoV-2 RNA is generally detectable in upper and lower  respiratory specimens dur ing the acute phase of infection.  Positive  results are indicative of active infection with SARS-CoV-2.  Clinical  correlation with patient history and other diagnostic information is  necessary to determine patient infection status.  Positive results do  not rule out bacterial infection or co-infection with other viruses. If result is PRESUMPTIVE POSTIVE SARS-CoV-2 nucleic acids MAY BE PRESENT.   A presumptive positive result was obtained on the submitted specimen  and confirmed on repeat testing.  While 2019 novel coronavirus  (SARS-CoV-2) nucleic acids may be present in the submitted sample  additional confirmatory testing may be necessary for epidemiological  and / or clinical management purposes  to differentiate between  SARS-CoV-2 and other Sarbecovirus currently known to infect humans.  If clinically indicated additional testing with an alternate test  methodology [phone]) is advised. The SARS-CoV-2 RNA is generally  detectable in upper and lower respiratory sp ecimens during the acute  phase of infection. The expected result is Negative. Fact Sheet for Patients:  StrictlyIdeas.no Fact Sheet for Healthcare Providers: BankingDealers.co.za This test is not yet approved or cleared by the [place] FDA and has been authorized for detection and/or diagnosis of SARS-CoV-2 by FDA under an Emergency Use Authorization (EUA).  This EUA will remain in effect (meaning this test can be used) for the duration of the COVID-19 declaration under Section 564(b)(1) of the Act, 21 U.S.C. section 360bbb-3(b)(1), unless the  authorization is terminated or revoked sooner. Performed at [facility], [address], [place], [place] [address]        Today   [place] today has no new complaints, --No chest pains no palpitations no dizziness --Eating and drinking well - eager to go home          Patient has been seen and examined prior to discharge   Objective   Blood pressure 94/76, pulse 72, temperature 98.1 F (36.7 C), temperature source Oral, resp. rate 16, height 5' (1.524 m), weight 85.3 kg, last menstrual period 10/31/2002, SpO2 91 %.   Intake/Output Summary (Last 24 hours) at 04/23/2019 1247 Last data filed at 04/23/2019 0900 Gross per 24 hour  Intake 170 ml  Output -  Net 170 ml    Exam Gen:- Awake Alert, no acute distress = HEENT:- [place].AT, No sclera icterus Neck-Supple Neck,No JVD,.  Lungs-  CTAB , good air movement bilaterally  CV- S1, S2 normal, regular Abd-  +ve B.Sounds, Abd Soft, No tenderness,    Extremity/Skin:- No  edema,   good pulses Psych-affect is appropriate, oriented x3 Neuro-no new focal deficits, no tremors    Data Review   CBC w Diff:  Lab Results  Component Value Date   WBC 9.8 04/23/2019   HGB 10.5 (L) 04/23/2019   HGB 9.8 (L) 09/30/2018   HCT 35.4 (L) 04/23/2019   HCT 31.5 (L) 09/30/2018   PLT 280 04/23/2019   PLT 341 09/30/2018   LYMPHOPCT  20 04/22/2019   BANDSPCT 0 10/15/2008   MONOPCT 7 04/22/2019   EOSPCT 3 04/22/2019   BASOPCT 0 04/22/2019    CMP:  Lab Results  Component Value Date   NA 142 04/23/2019   NA 142 09/30/2018   K 3.4 (L) 04/23/2019   CL 102 04/23/2019   CO2 28 04/23/2019   BUN 37 (H) 04/23/2019   BUN 23 09/30/2018   CREATININE 1.48 (H) 04/23/2019   CREATININE 1.84 (H) 04/13/2019   PROT 7.0 04/23/2019   ALBUMIN 3.5 04/23/2019   BILITOT 0.6 04/23/2019   ALKPHOS 53 04/23/2019   AST 15 04/23/2019   ALT 15 04/23/2019  .   Total Discharge time is about 33 minutes  Roxan Hockey M.D on  04/23/2019 at 12:47 PM  Go to [email or web address] -  for contact info  [facility] - Office  [phone]

## 2019-04-23 NOTE — Progress Notes (Signed)
Patient is being discharged home. One peripheral IV removed. Patient given discharge instructions and verbalized understanding. Taken down to main entrance via wheelchair to be picked up.

## 2019-04-24 ENCOUNTER — Telehealth: Payer: Self-pay

## 2019-04-24 LAB — HEMOGLOBIN A1C
Hgb A1c MFr Bld: 6.2 % — ABNORMAL HIGH (ref 4.8–5.6)
Mean Plasma Glucose: 131 mg/dL

## 2019-04-24 LAB — CALCIUM, IONIZED
Calcium, Ionized, Serum: 3.3 mg/dL — ABNORMAL LOW (ref 4.5–5.6)
Calcium, Ionized, Serum: 3.6 mg/dL — ABNORMAL LOW (ref 4.5–5.6)
Calcium, Ionized, Serum: 3.6 mg/dL — ABNORMAL LOW (ref 4.5–5.6)

## 2019-04-24 NOTE — Telephone Encounter (Signed)
Transition Care Management Follow-up Telephone Call   Date discharged?  04/23/2019              How have you been since you were released from the hospital? I'm feeling fine   Do you understand why you were in the hospital? My calcium dropped again    Do you understand the discharge instructions? yes   Where were you discharged to? home   Items Reviewed:  Medications reviewed: yes  Allergies reviewed: yes  Dietary changes reviewed: yes  Referrals reviewed: yes   Functional Questionnaire:   Activities of Daily Living (ADLs):  yes    Any transportation issues/concerns?: no   Any patient concerns? no   Confirmed importance and date/time of follow-up visits scheduled 04/30/19 at 9am with [place]     Confirmed with patient if condition begins to worsen call PCP or go to the ER.  Patient was given the office number and encouraged to call back with question or concerns.  yes with verbal understanding

## 2019-04-30 ENCOUNTER — Ambulatory Visit (INDEPENDENT_AMBULATORY_CARE_PROVIDER_SITE_OTHER): Payer: 59 | Admitting: Family Medicine

## 2019-04-30 ENCOUNTER — Encounter: Payer: Self-pay | Admitting: Family Medicine

## 2019-04-30 ENCOUNTER — Other Ambulatory Visit: Payer: Self-pay

## 2019-04-30 VITALS — Ht 60.0 in | Wt 190.0 lb

## 2019-04-30 DIAGNOSIS — I4581 Long QT syndrome: Secondary | ICD-10-CM | POA: Diagnosis not present

## 2019-04-30 DIAGNOSIS — Z9189 Other specified personal risk factors, not elsewhere classified: Secondary | ICD-10-CM

## 2019-04-30 DIAGNOSIS — E559 Vitamin D deficiency, unspecified: Secondary | ICD-10-CM

## 2019-04-30 DIAGNOSIS — IMO0001 Reserved for inherently not codable concepts without codable children: Secondary | ICD-10-CM

## 2019-04-30 LAB — COMPLETE METABOLIC PANEL WITH GFR
AG Ratio: 1.3 (calc) (ref 1.0–2.5)
ALT: 8 U/L (ref 6–29)
AST: 12 U/L (ref 10–35)
Albumin: 3.8 g/dL (ref 3.6–5.1)
Alkaline phosphatase (APISO): 50 U/L (ref 37–153)
BUN/Creatinine Ratio: 19 (calc) (ref 6–22)
BUN: 35 mg/dL — ABNORMAL HIGH (ref 7–25)
CO2: 28 mmol/L (ref 20–32)
Calcium: 6.9 mg/dL — ABNORMAL LOW (ref 8.6–10.4)
Chloride: 104 mmol/L (ref 98–110)
Creat: 1.81 mg/dL — ABNORMAL HIGH (ref 0.50–1.05)
GFR, Est African American: 36 mL/min/{1.73_m2} — ABNORMAL LOW (ref >=60)
GFR, Est Non African American: 31 mL/min/{1.73_m2} — ABNORMAL LOW (ref >=60)
Globulin: 2.9 g/dL (calc) (ref 1.9–3.7)
Glucose, Bld: 91 mg/dL (ref 65–99)
Potassium: 5 mmol/L (ref 3.5–5.3)
Sodium: 145 mmol/L (ref 135–146)
Total Bilirubin: 0.4 mg/dL (ref 0.2–1.2)
Total Protein: 6.7 g/dL (ref 6.1–8.1)

## 2019-04-30 LAB — VITAMIN D 25 HYDROXY (VIT D DEFICIENCY, FRACTURES): Vit D, 25-Hydroxy: 37 ng/mL (ref 30–100)

## 2019-04-30 LAB — PHOSPHORUS: Phosphorus: 7.6 mg/dL — ABNORMAL HIGH (ref 2.5–4.5)

## 2019-04-30 LAB — MAGNESIUM: Magnesium: 2.3 mg/dL (ref 1.5–2.5)

## 2019-04-30 NOTE — Progress Notes (Signed)
Virtual Visit via Telephone Note   This visit type was conducted due to national recommendations for restrictions regarding the COVID-19 Pandemic (e.g. social distancing) in an effort to limit this patient's exposure and mitigate transmission in our community.  Due to her co-morbid illnesses, this patient is at least at moderate risk for complications without adequate follow up.  This format is felt to be most appropriate for this patient at this time.  The patient did not have access to video technology/had technical difficulties with video requiring transitioning to audio format only (telephone).  All issues noted in this document were discussed and addressed.  No physical exam could be performed with this format.    Evaluation Performed:  Follow-up visit  Date:  04/30/2019   ID:  Sabrina, [name] 03/10/1961, MRN [id number]  Patient Location: Home Provider Location: Office  Location of Patient: Home Location of Provider: Telehealth Consent was obtain for visit to be over via telehealth. I verified that I am speaking with the correct person using two identifiers.  PCP:  Fayrene Helper, MD   Chief Complaint:  TOC  History of Present Illness:    Sabrina [name] is a 58 y.o. female with extensive history of connective tissue disease, scleroderma, hypertension, pulmonary hypertension, chronic diastolic heart failure, interstitial lung disease, diabetes without complication among others presented to the emergency room on September nights of 2020 secondary to the direction of Dr. Dorris Fetch her endocrinologist.  She reported that she had been seen by the cardiologist and had blood work done that day as well.  The endocrinologist saw the results and asked the patient to come in for hypocalcemia.  Patient was previously admitted August 14 for the same thing.  Since then she has followed with her specialist and reports that she has been taking her calcium supplement as  prescribed.  Patient reports that in her last admission she was feeling electricity in her head, having muscle twitching and shortness of breath.  This time she reports she was completely asymptomatic except for occasional leg cramps.  Dr. Dorris Fetch follows the patient for hypocalcemia, vitamin D deficiency, hypothyroidism and goiter.  Dr. Dorris Fetch checked PTH on 03/30/2019 it was normal at 51 this is then classified as PTH independent hypercalcemia.  They have continued the patient on calcium carbonate and vitamin D.  She is also on Mag-Ox 250 daily.  She takes all her medications as directed.  She is also followed by rheumatology at [facility].  She is on mycophenylate.  Additionally she has long QT syndrome and is on a low-dose beta-blocker for this.  Emergency room course demonstrated blood work that had mild leukocytosis at 11.2, mild anemia with a hemoglobin of 11.2, hypokalemia at 3.1.  Chronic kidney disease stage III with a GFR 37.  EKG shows QT of 531, normal sinus rhythm rate of 65.  She was ordered to have blood work, calcium gluconate x2 was given in the emergency room, advised to continue her current doses of calcium and vitamin D and magnesium at home.  She was monitored on telemetry.  And admitted for observation.  She is to follow-up with Dr. Dorris Fetch her endocrinologist within the week of being discharged for reevaluation of blood work.  She reports that she had a blood work drawn this morning.  She is hoping for good results.  She again reports that she is feeling okay and not having any issues at this time.  Just would really like the calcium  to come back up so that she does not have to return to the hospital.  The patient does not have symptoms concerning for COVID-19 infection (fever, chills, cough, or new shortness of breath).   Past Medical, Surgical, Social History, Allergies, and Medications have been Reviewed.   Past Medical History:  Diagnosis Date   Allergy    Anemia    Arthritis     Asthma    Chronic bronchitis (HCC)    Connective tissue disease ([place])    Diabetes mellitus    Endometriosis    Fibroid uterus    GERD (gastroesophageal reflux disease)    History of Doppler ultrasound    a. Carotid US 8/08:Estimated stenosis in the right and left internal carotid arteries is 0-50% and 0-50%.   History of echocardiogram    a. Echo 6/16: EF 60-65%, no RWMA.   History of hiatal hernia    History of nuclear stress test    a. Myoview 6/16: There is no resting or stress perfusion defect consistent with no prior infarct and no ischemia. EF 73 %  The patient was hypertensive prior and during the study. Cardiac cath 04/2016 showed normal coronary arteries   Hx of iron deficiency anemia    Hypersomnia with sleep apnea    Hypertension    Lupus ([place]) 07/2013   Of Skin   Menorrhagia    Obesity    Pneumonia    Prolonged QT syndrome 06/22/2016   Secondary hyperthyroidism    Thyroid disease    Tinnitus    left ear   Tubular adenoma of colon    Vertigo    Vitamin D deficiency    Past Surgical History:  Procedure Laterality Date   ABDOMINAL HYSTERECTOMY  2004   supracervical hysterectomy   BREAST SURGERY  2005   reduction   BREATH TEK H PYLORI [name] 12/24/2014   Procedure: [place];  Surgeon: Greer Pickerel, MD;  Location: [facility];  Service: General;  Laterality: [name];   CARDIAC CATHETERIZATION [name] 03/22/2016   Procedure: Right/Left Heart Cath and Coronary Angiography;  Surgeon: Troy Sine, MD;  Location: [facility];  Service: Cardiovascular;  Laterality: [name];   DILATION AND CURETTAGE OF UTERUS     age 6   ESOPHAGOGASTRODUODENOSCOPY (EGD) WITH PROPOFOL [name] 10/14/2015   Procedure: ESOPHAGOGASTRODUODENOSCOPY (EGD) WITH PROPOFOL;  Surgeon: Clarene Essex, MD;  Location: [facility];  Service: Endoscopy;  Laterality: [name];   LAPAROSCOPIC GASTRIC SLEEVE RESECTION WITH HIATAL HERNIA REPAIR [name] 07/05/2015   Procedure: LAPAROSCOPIC  GASTRIC SLEEVE RESECTION WITH HIATAL HERNIA REPAIR, upper endoscopy;  Surgeon: Greer Pickerel, MD;  Location: [facility];  Service: General;  Laterality: [name];   REDUCTION MAMMAPLASTY Bilateral    RIGHT HEART CATH [name] 10/13/2018   Procedure: RIGHT HEART CATH;  Surgeon: Larey Dresser, MD;  Location: [facility];  Service: Cardiovascular;  Laterality: [name];     Current Meds  Medication Sig   acetaminophen (TYLENOL) 325 MG tablet Take 2 tablets (650 mg total) by mouth every 6 (six) hours as needed for mild pain (or Fever >/= 101).   ambrisentan (LETAIRIS) 5 MG tablet Take 1 tablet (5 mg total) by mouth daily.   aspirin EC 81 MG tablet Take 1 tablet (81 mg total) by mouth daily with breakfast.   bisoprolol (ZEBETA) 5 MG tablet Take 0.5 tablets (2.5 mg total) by mouth daily.   calcium carbonate (OS-CAL - DOSED IN MG OF ELEMENTAL CALCIUM) 1250 (500 Ca) MG tablet  Take 2 tablets (1,000 mg of elemental calcium total) by mouth 3 (three) times daily with meals.   levothyroxine (SYNTHROID) 75 MCG tablet TAKE 1 TABLET (75 MCG TOTAL) BY MOUTH DAILY BEFORE BREAKFAST.   Magnesium Oxide 250 MG TABS Take 1 tablet by mouth daily with breakfast.   Multiple Vitamin (MULTIVITAMIN WITH MINERALS) TABS tablet Take 1 tablet by mouth daily.   mycophenolate (MYFORTIC) 360 MG TBEC EC tablet Take 720 mg by mouth 2 (two) times daily.    nitroGLYCERIN (NITROSTAT) 0.4 MG SL tablet Place 0.4 mg under the tongue every 5 (five) minutes x 3 doses as needed for chest pain.    pantoprazole (PROTONIX) 20 MG tablet Take 20 mg by mouth See admin instructions. Take 1 tablet (20 mg) by mouth scheduled each morning, may take an additional dose in the evening if needed for heartburn/indigestion.   potassium chloride (K-DUR) 10 MEQ tablet Take 6 tablets (60 mEq total) by mouth every morning AND 4 tablets (40 mEq total) every evening.   Selexipag (UPTRAVI) 1200 MCG TABS Take 1,200 mcg by mouth every 12 (twelve) hours.    senna-docusate (SENOKOT-S) 8.6-50 MG tablet Take 2 tablets by mouth at bedtime.   tadalafil, PAH, (ADCIRCA) 20 MG tablet Take 1 tablet (20 mg total) by mouth daily.   temazepam (RESTORIL) 15 MG capsule Take 1 capsule (15 mg total) by mouth at bedtime as needed for sleep.   torsemide (DEMADEX) 20 MG tablet 60 mg in the AM and 40 mg in the PM   triamcinolone ointment (KENALOG) 0.1 % Apply 1 application topically 2 (two) times daily as needed (dry/irritated skin.). APPLY AS DIRECTED TO ITCHING SPOTS ON THE BODY DAILY TO TWICE DAILY AS NEEDED. NOT TO FACE.    UNABLE TO FIND Overnight Pulse Oximetry (to evaluate the need for continued nocturnal oxygen)  DX I27.20   Vitamin D, Ergocalciferol, (DRISDOL) 1.25 MG (50000 UT) CAPS capsule Take 1 capsule (50,000 Units total) by mouth every Thursday.     Allergies:   Peanut oil, Potassium iodide, Ace inhibitors, Doxycycline, Peanut-containing drug products, Amlodipine, Codeine, Latex, and Penicillins   Social History   Tobacco Use   Smoking status: Never Smoker   Smokeless tobacco: Never Used  Substance Use Topics   Alcohol use: No    Alcohol/week: 0.0 standard drinks   Drug use: No     Family Hx: The patient's family history includes Arthritis in her father, mother, and sister; Bleeding Disorder in her sister; Breast cancer in her sister; CAD in her mother; Cancer in her father; Colon cancer in her maternal grandfather; Depression in her father, mother, and sister; Diabetes in her father and mother; Hypertension in her father, mother, and sister; Stroke in her father and maternal aunt; Thyroid disease in her sister. There is no history of Heart attack.  ROS:   Please see the history of present illness.    All other systems reviewed and are negative.   Labs/Other Tests and Data Reviewed:      Recent Labs: 03/30/2019: TSH 2.437 04/09/2019: B Natriuretic Peptide 494.5 04/23/2019: ALT 15; BUN 37; Creatinine, Ser 1.48; Hemoglobin 10.5;  Magnesium 2.2; Platelets 280; Potassium 3.4; Sodium 142   Recent Lipid Panel Lab Results  Component Value Date/Time   CHOL 166 12/23/2017 07:38 AM   TRIG 109 12/23/2017 07:38 AM   HDL 35 (L) 12/23/2017 07:38 AM   CHOLHDL 4.7 12/23/2017 07:38 AM   LDLCALC 109 (H) 12/23/2017 07:38 AM    Wt Readings from  Last 3 Encounters:  04/30/19 190 lb (86.2 kg)  04/23/19 188 lb 0.8 oz (85.3 kg)  04/22/19 191 lb 12.8 oz (87 kg)     Objective:    Vital Signs:  Ht 5' (1.524 m)    Wt 190 lb (86.2 kg)    LMP 10/31/2002    BMI 37.11 kg/m    GEN:  alert and oriented RESPIRATORY:  no shortness of breath noted in conversation PSYCH:  normal mood and affect   ASSESSMENT & PLAN:    1. Transition of care performed with sharing of clinical summary As documented above.  As previously discussed she is followed by rheumatology, cardiology, soon nephrology as well.  Most recently had follow-ups with cardiology and endocrinology.  Which is what her labs were drawn which put her in the hospital.  Appreciate collaboration in the patient's care.  Please let PCP know if there is any assistance that is needed from [place].  Advised to continue all medications as currently prescribed.  She has an increase in her calcium at this time.  Reports that she is taking it 3 times a day at this time.  Encouraged to continue that and her vitamin D along with any other medications that have been prescribed.  2. Vitamin D deficiency Reports that she did not feel like she was having any issues until she stopped the vitamin D prescription as previously stated and other visits.  Restarted the vitamin D at the last visit.  Probably needs to have a couple doses before she recognizes any improvement.  Advised to continue this.  3. Hypocalcemia Chronic hypocalcemia at this time.  Dr. Dorris Fetch has adjusted her calcium intake.  Hopefully these new labs drawn today will demonstrate an improvement.  Restarting vitamin D might help with rebalancing  the calcium as well.  But this might take a little while.  Additionally she is following up with a nephrologist.  Marena Chancy if she needs any phosphorus binding secondary to kidney issues.  I discussed this with her at last visit.  She reports that they are going to be working on some things but she is unsure of what.  4. Prolonged QT syndrome Advised to continue taking her beta-blocker.  She has no complications of her QT syndrome at this time.  As stated she is followed by cardiology as well.   Time:   Today, I have spent 10 minutes with the patient with telehealth technology discussing the above problems.     Medication Adjustments/Labs and Tests Ordered: Current medicines are reviewed at length with the patient today.  Concerns regarding medicines are outlined above.   Tests Ordered: No orders of the defined types were placed in this encounter.   Medication Changes: No orders of the defined types were placed in this encounter.   Disposition:  Follow up has follow-up on December/28 of 2020 advised to come in if she needs [place] before then.  Signed, Perlie Mayo, NP  04/30/2019 9:11 AM     [facility]

## 2019-04-30 NOTE — Patient Instructions (Signed)
     Thank you for completing via telephone.  I appreciate the opportunity to provide you with the care for your health and wellness. Today we discussed: Transition of care  Follow-up:08/10/2019 or as needed  You had labs drawn already today.  Hoping for good results with these.  I know that you are followed closely by Dr. Dorris Fetch at this time.  Hoping that the increase in your calcium and the restart of vitamin D will help.  We will be following you through the epic system.  If you need anything from Dr. Moshe Cipro I please do not ever hesitate to reach out we are here for you.  Please stay safe and healthy.  Please continue to practice social distancing to keep you, your family, and our community safe.  If you must go out, please wear a Mask and practice good handwashing.  [employer] YOUR HANDS WELL AND FREQUENTLY. AVOID TOUCHING YOUR FACE, UNLESS YOUR HANDS ARE FRESHLY WASHED.  GET FRESH AIR DAILY. STAY HYDRATED WITH WATER.   It was a pleasure to see you and I look forward to continuing to work together on your health and well-being. Please do not hesitate to call the office if you need care or have questions about your care.  Have a wonderful day and week. With Gratitude, Cherly Beach, DNP, AGNP-BC

## 2019-05-01 ENCOUNTER — Encounter: Payer: Self-pay | Admitting: "Endocrinology

## 2019-05-01 ENCOUNTER — Ambulatory Visit (INDEPENDENT_AMBULATORY_CARE_PROVIDER_SITE_OTHER): Payer: 59 | Admitting: "Endocrinology

## 2019-05-01 ENCOUNTER — Other Ambulatory Visit: Payer: Self-pay

## 2019-05-01 DIAGNOSIS — E876 Hypokalemia: Secondary | ICD-10-CM

## 2019-05-01 DIAGNOSIS — E039 Hypothyroidism, unspecified: Secondary | ICD-10-CM | POA: Diagnosis not present

## 2019-05-01 MED ORDER — POTASSIUM CHLORIDE CRYS ER 10 MEQ PO TBCR: EXTENDED_RELEASE_TABLET | ORAL | 3 refills | Status: DC

## 2019-05-01 NOTE — Progress Notes (Signed)
05/01/2019                                    Endocrinology Telehealth Visit Follow up Note -During COVID -19 Pandemic  I connected with Sabrina [name] on 05/01/2019   by telephone and verified that I am speaking with the correct person using two identifiers. [place], 17-Nov-1960. she has verbally consented to this visit. All issues noted in this document were discussed and addressed. The format was not optimal for physical exam.                      Subjective:    Patient ID: Sabrina [name], female    DOB: 1961/07/16, PCP Fayrene Helper, MD   Past Medical History:  Diagnosis Date  . Allergy   . Anemia   . Arthritis   . Asthma   . Chronic bronchitis ([place])   . Connective tissue disease ([place])   . Diabetes mellitus   . Endometriosis   . Fibroid uterus   . GERD (gastroesophageal reflux disease)   . History of Doppler ultrasound    a. Carotid US 8/08:Estimated stenosis in the right and left internal carotid arteries is 0-50% and 0-50%.  . History of echocardiogram    a. Echo 6/16: EF 60-65%, no RWMA.  . History of hiatal hernia   . History of nuclear stress test    a. Myoview 6/16: There is no resting or stress perfusion defect consistent with no prior infarct and no ischemia. EF 73 %  The patient was hypertensive prior and during the study. Cardiac cath 04/2016 showed normal coronary arteries  . Hx of iron deficiency anemia   . Hypersomnia with sleep apnea   . Hypertension   . Lupus ([place]) 07/2013   Of Skin  . Menorrhagia   . Obesity   . Pneumonia   . 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 Secondary hyperthyroidism   . Thyroid disease   . Tinnitus    left ear  . Tubular adenoma of colon   . Vertigo   . Vitamin D deficiency    Past Surgical History:  Procedure Laterality Date  . ABDOMINAL HYSTERECTOMY  2004   supracervical hysterectomy  . BREAST SURGERY  2005   reduction  . BREATH TEK H PYLORI [name] 12/24/2014   Procedure: BREATH TEK H  PYLORI;  Surgeon: Greer Pickerel, MD;  Location: [facility];  Service: General;  Laterality: [name];  . CARDIAC CATHETERIZATION [name] 03/22/2016   Procedure: Right/Left Heart Cath and Coronary Angiography;  Surgeon: Troy Sine, MD;  Location: [facility];  Service: Cardiovascular;  Laterality: [name];  . DILATION AND CURETTAGE OF UTERUS     age 19  . ESOPHAGOGASTRODUODENOSCOPY (EGD) WITH PROPOFOL [name] 10/14/2015   Procedure: ESOPHAGOGASTRODUODENOSCOPY (EGD) WITH PROPOFOL;  Surgeon: Clarene Essex, MD;  Location: [facility];  Service: Endoscopy;  Laterality: [name];  . LAPAROSCOPIC GASTRIC SLEEVE RESECTION WITH HIATAL HERNIA REPAIR [name] 07/05/2015   Procedure: LAPAROSCOPIC GASTRIC SLEEVE RESECTION WITH HIATAL HERNIA REPAIR, upper endoscopy;  Surgeon: Greer Pickerel, MD;  Location: [facility];  Service: General;  Laterality: [name];  . REDUCTION MAMMAPLASTY Bilateral   . RIGHT HEART CATH [name] 10/13/2018   Procedure: RIGHT HEART CATH;  Surgeon: Larey Dresser, MD;  Location: [facility];  Service: Cardiovascular;  Laterality: [name];   Social History   Socioeconomic History  .  Marital status: Married    Spouse name: Not on file  . Number of children: Not on file  . Years of education: 59  . Highest education level: Not on file  Occupational History  . Occupation: Therapist, music: Radio broadcast assistant  Social Needs  . Financial resource strain: Not on file  . Food insecurity    Worry: Not on file    Inability: Not on file  . Transportation needs    Medical: Not on file    Non-medical: Not on file  Tobacco Use  . Smoking status: Never Smoker  . Smokeless tobacco: Never Used  Substance and Sexual Activity  . Alcohol use: No    Alcohol/week: 0.0 standard drinks  . Drug use: No  . Sexual activity: Yes    Partners: Male    Birth control/protection: Surgical    Comment: hysterectomy (supracervical)  Lifestyle  . Physical activity    Days per week: Not on file    Minutes  per session: Not on file  . Stress: Not on file  Relationships  . Social Herbalist on phone: Not on file    Gets together: Not on file    Attends religious service: Not on file    Active member of club or organization: Not on file    Attends meetings of clubs or organizations: Not on file    Relationship status: Not on file  Other Topics Concern  . Not on file  Social History Narrative   Regular exercise-no   Caffeine Use-no      [facility]: Patient lives with her husband. Works for [employer]. No bird or mold exposure. No pets currently.         Outpatient Encounter Medications as of 05/01/2019  Medication Sig  . acetaminophen (TYLENOL) 325 MG tablet Take 2 tablets (650 mg total) by mouth every 6 (six) hours as needed for mild pain (or Fever >/= 101).  [name] ambrisentan (LETAIRIS) 5 MG tablet Take 1 tablet (5 mg total) by mouth daily.  [name] aspirin EC 81 MG tablet Take 1 tablet (81 mg total) by mouth daily with breakfast.  . bisoprolol (ZEBETA) 5 MG tablet Take 0.5 tablets (2.5 mg total) by mouth daily.  . calcium carbonate (OS-CAL - DOSED IN MG OF ELEMENTAL CALCIUM) 1250 (500 Ca) MG tablet Take 2 tablets (1,000 mg of elemental calcium total) by mouth 3 (three) times daily with meals.  [name] levothyroxine (SYNTHROID) 75 MCG tablet TAKE 1 TABLET (75 MCG TOTAL) BY MOUTH DAILY BEFORE BREAKFAST.  . Magnesium Oxide 250 MG TABS Take 1 tablet by mouth daily with breakfast.  . Multiple Vitamin (MULTIVITAMIN WITH MINERALS) TABS tablet Take 1 tablet by mouth daily.  . mycophenolate (MYFORTIC) 360 MG TBEC EC tablet Take 720 mg by mouth 2 (two) times daily.   . nitroGLYCERIN (NITROSTAT) 0.4 MG SL tablet Place 0.4 mg under the tongue every 5 (five) minutes x 3 doses as needed for chest pain.   . pantoprazole (PROTONIX) 20 MG tablet Take 20 mg by mouth See admin instructions. Take 1 tablet (20 mg) by mouth scheduled each morning, may take an additional dose in the evening if  needed for heartburn/indigestion.  . potassium chloride (K-DUR) 10 MEQ tablet Take 2 tablets (20 mEq total) by mouth every morning AND 2 tablets (20 mEq total) every evening.  . Selexipag (UPTRAVI) 1200 MCG TABS Take 1,200 mcg by mouth every 12 (twelve) hours.  [name]  senna-docusate (SENOKOT-S) 8.6-50 MG tablet Take 2 tablets by mouth at bedtime.  . tadalafil, PAH, (ADCIRCA) 20 MG tablet Take 1 tablet (20 mg total) by mouth daily.  . temazepam (RESTORIL) 15 MG capsule Take 1 capsule (15 mg total) by mouth at bedtime as needed for sleep.  [name] torsemide (DEMADEX) 20 MG tablet 60 mg in the AM and 40 mg in the PM  . triamcinolone ointment (KENALOG) 0.1 % Apply 1 application topically 2 (two) times daily as needed (dry/irritated skin.). APPLY AS DIRECTED TO ITCHING SPOTS ON THE BODY DAILY TO TWICE DAILY AS NEEDED. NOT TO FACE.   [name] UNABLE TO FIND Overnight Pulse Oximetry (to evaluate the need for continued nocturnal oxygen)  DX I27.20  . Vitamin D, Ergocalciferol, (DRISDOL) 1.25 MG (50000 UT) CAPS capsule Take 1 capsule (50,000 Units total) by mouth every Thursday.  . [DISCONTINUED] potassium chloride (K-DUR) 10 MEQ tablet Take 2 tablets by mouth twice daily  . [DISCONTINUED] potassium chloride (K-DUR) 10 MEQ tablet Take 6 tablets (60 mEq total) by mouth every morning AND 4 tablets (40 mEq total) every evening.   No facility-administered encounter medications on file as of 05/01/2019.    ALLERGIES: Allergies  Allergen Reactions  . Peanut Oil Swelling  . Potassium Iodide Other (See Comments)    facial swelling, also eyes, and ears  . Ace Inhibitors Cough  . Doxycycline Hives  . Peanut-Containing Drug Products Nausea And Vomiting  . Amlodipine Rash  . Codeine Nausea And Vomiting  . Latex Hives and Rash  . Penicillins Hives, Itching and Rash    .Marland KitchenHas patient had a PCN reaction causing immediate rash, facial/tongue/throat swelling, SOB or lightheadedness with hypotension: No Has patient had a PCN  reaction causing severe rash involving mucus membranes or skin necrosis: No Has patient had a PCN reaction that required hospitalization No Has patient had a PCN reaction occurring within the last 10 years: YES (3 years ago)  If all of the above answers are "NO", then may proceed with Cephalosporin use.     VACCINATION STATUS: Immunization History  Administered Date(s) Administered  . Influenza Split 06/22/2011, 05/13/2018  . Influenza Whole 07/11/2006  . Influenza,inj,Quad PF,6+ Mos 06/26/2013, 05/11/2014, 06/13/2015, 05/20/2017  . Influenza-Unspecified 06/26/2013, 05/11/2014, 06/13/2015, 06/06/2016, 05/20/2017  . Pneumococcal Conjugate-13 02/07/2015  . Pneumococcal Polysaccharide-23 06/26/2013  . Td 04/11/2004  . Tdap 05/09/2015    HPI   58 year old female patient with medical history as above. -She is returning to follow-up for her history of hypoglycemia and hypothyroidism.  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  She was supposed to be on a regular supplement with calcium, magnesium, and vitamin D.    Since her last visit, her calcium dropped to 6.1 mg per DL which required hospitalization for 1 day.  She was discharged after calcium and potassium supplement, most recent labs from yesterday showed calcium at 6.9.   -She did not have symptoms, was actually advised to go to the hospital because of the hypoglycemia. -Reportedly, she was not taking the calcium carbonate prescribed for her during her last encounter with me. -She had scleroderma, on various modalities of treatment.  She has hypomagnesemia on ongoing supplement. -She has history of multinodular goiter with biopsy of one of her nodules consistent with hyperblastic nodule.  Repeat thyroid ultrasound on March 13, 2017 was remarkable for another left lobe nodule 1.5 cm with benign features. She did not show up for her appointment for surveillance thyroid  ultrasound.  -She is on  levothyroxine 75 mcg for hypothyroidism, reports compliance with this medication -She denies dysphagia, shortness of breath, voice change. - She has a history of prolonged QT interval.  She has no new complaints today.  Review of Systems  Limited as above.  Objective:    LMP 10/31/2002   Wt Readings from Last 3 Encounters:  04/30/19 190 lb (86.2 kg)  04/23/19 188 lb 0.8 oz (85.3 kg)  04/22/19 191 lb 12.8 oz (87 kg)     CMP     Component Value Date/Time   NA 145 04/30/2019 0715   NA 142 09/30/2018 1409   K 5.0 04/30/2019 0715   CL 104 04/30/2019 0715   CO2 28 04/30/2019 0715   GLUCOSE 91 04/30/2019 0715   BUN 35 (H) 04/30/2019 0715   BUN 23 09/30/2018 1409   CREATININE 1.81 (H) 04/30/2019 0715   CALCIUM 6.9 (L) 04/30/2019 0715   CALCIUM 7.8 (L) 12/02/2008 2239   PROT 6.7 04/30/2019 0715   ALBUMIN 3.5 04/23/2019 0439   AST 12 04/30/2019 0715   ALT 8 04/30/2019 0715   ALKPHOS 53 04/23/2019 0439   BILITOT 0.4 04/30/2019 0715   GFRNONAA 31 (L) 04/30/2019 0715   GFRAA 36 (L) 04/30/2019 0715     Diabetic Labs (most recent): Lab Results  Component Value Date   HGBA1C 6.2 (H) 04/22/2019   HGBA1C 5.7 (H) 12/23/2017   HGBA1C 5.3 12/11/2016     Lipid Panel ( most recent) Lipid Panel     Component Value Date/Time   CHOL 166 12/23/2017 0738   TRIG 109 12/23/2017 0738   HDL 35 (L) 12/23/2017 0738   CHOLHDL 4.7 12/23/2017 0738   VLDL 18 12/11/2016 0743   LDLCALC 109 (H) 12/23/2017 0738     Assessment & Plan:   1. 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  Not due to hypoparathyroidism.  She has elevated PTH appropriately in response to hypocalcemia.  -For various reasons, she has not been consistent taking her supplements.  Did not require hospitalization in the interval due to hypocalcemia of 6.1.  She will supplemented with calcium and potassium and was discharged, most recent labs show calcium of 6.9.   She is advised to resume and continue  calcium carbonate 1250 mg (500 mg elemental calcium), 2 tablets 3 times a day with meals.  This will supplement 3000 mg of elemental calcium daily. -She is advised to continue magnesium oxide 250 mg p.o. daily with breakfast. She is advised to lower her potassium supplement to 20 mg twice a day, her current potassium is 5.     -She is advised to obtain labs in 4 weeks.   -She is advised to call clinic if she develops symptoms of hypoglycemia including cramps, tingling,.   2. hypothyroidism -Her recent thyroid function tests are consistent with appropriate replacement.   She is advised to continue levothyroxine 75 mcg p.o. daily before breakfast.    - We discussed about the correct intake of her thyroid hormone, on empty stomach at fasting, with water, separated by at least 30 minutes from breakfast and other medications,  and separated by more than 4 hours from calcium, iron, multivitamins, acid reflux medications (PPIs). -Patient is made aware of the fact that thyroid hormone replacement is needed for life, dose to be adjusted by periodic monitoring of thyroid function tests.   3.  Nodular goiter -Her thyroid ultrasound from August 2018 was remarkable for  stable isthmus nodule at 1.8 cm and new 1.5 cm nodule on the left lobe with benign and non- suspicious features.  She missed her appointment for  surveillance thyroid/neck ultrasound.  She will be reconsidered for surveillance thyroid ultrasound after her next visit. -She will not require intervention at this time.  4.  Type 2 diabetes-controlled with A1c of 6.2%.  Not on medications currently.     - I advised patient to maintain close follow up with Fayrene Helper, MD for primary care needs.    Time for this visit: 15 minutes. Minda Bajaj Kluver  participated in the discussions, expressed understanding, and voiced agreement with the above plans.  All questions were answered to her  satisfaction. she is encouraged to contact clinic should she have any questions or concerns prior to her return visit.   Follow up plan: Return in about 5 weeks (around 06/05/2019) for Follow up with Pre-visit Labs.  Glade Lloyd, MD Phone: [phone]  Fax: [phone]   05/01/2019, 10:46 AM

## 2019-05-04 ENCOUNTER — Encounter: Payer: Self-pay | Admitting: Family Medicine

## 2019-05-19 ENCOUNTER — Telehealth (HOSPITAL_COMMUNITY): Payer: Self-pay

## 2019-05-19 NOTE — Telephone Encounter (Signed)
PT called regarding her appt scheduled for 10/7...stated she had not received her speciality medication and wasn't sure if she should come in or not.  I spoke with triage regarding the inquiry and was advised to advise PT that the speciality Rx process is a little lengthy and she should be receiving the medication soon.  Also, once she receives it and begins taking it, to contact our office to schedule her an appt 1 month out.   I advised PT of info received and PT understood information provided and agreed to contact us once she receives the medication and begins taking it.

## 2019-05-20 ENCOUNTER — Encounter (HOSPITAL_COMMUNITY): Payer: 59 | Admitting: Cardiology

## 2019-05-22 ENCOUNTER — Encounter (HOSPITAL_COMMUNITY): Payer: 59 | Admitting: Cardiology

## 2019-06-02 LAB — COMPLETE METABOLIC PANEL WITH GFR
AG Ratio: 1.3 (calc) (ref 1.0–2.5)
ALT: 9 U/L (ref 6–29)
AST: 12 U/L (ref 10–35)
Albumin: 3.6 g/dL (ref 3.6–5.1)
Alkaline phosphatase (APISO): 51 U/L (ref 37–153)
BUN/Creatinine Ratio: 24 (calc) — ABNORMAL HIGH (ref 6–22)
BUN: 41 mg/dL — ABNORMAL HIGH (ref 7–25)
CO2: 31 mmol/L (ref 20–32)
Calcium: 6.4 mg/dL — ABNORMAL LOW (ref 8.6–10.4)
Chloride: 101 mmol/L (ref 98–110)
Creat: 1.72 mg/dL — ABNORMAL HIGH (ref 0.50–1.05)
GFR, Est African American: 38 mL/min/{1.73_m2} — ABNORMAL LOW (ref >=60)
GFR, Est Non African American: 33 mL/min/{1.73_m2} — ABNORMAL LOW (ref >=60)
Globulin: 2.8 g/dL (calc) (ref 1.9–3.7)
Glucose, Bld: 87 mg/dL (ref 65–99)
Potassium: 4 mmol/L (ref 3.5–5.3)
Sodium: 144 mmol/L (ref 135–146)
Total Bilirubin: 0.4 mg/dL (ref 0.2–1.2)
Total Protein: 6.4 g/dL (ref 6.1–8.1)

## 2019-06-05 ENCOUNTER — Encounter: Payer: Self-pay | Admitting: "Endocrinology

## 2019-06-05 ENCOUNTER — Other Ambulatory Visit: Payer: Self-pay

## 2019-06-05 ENCOUNTER — Ambulatory Visit (INDEPENDENT_AMBULATORY_CARE_PROVIDER_SITE_OTHER): Payer: 59 | Admitting: "Endocrinology

## 2019-06-05 DIAGNOSIS — E039 Hypothyroidism, unspecified: Secondary | ICD-10-CM | POA: Diagnosis not present

## 2019-06-05 DIAGNOSIS — E119 Type 2 diabetes mellitus without complications: Secondary | ICD-10-CM | POA: Diagnosis not present

## 2019-06-05 HISTORY — DX: Hypomagnesemia: E83.42

## 2019-06-05 MED ORDER — CALCIUM CARBONATE 1250 (500 CA) MG PO TABS: 3.0000 | ORAL_TABLET | Freq: Three times a day (TID) | ORAL | 2 refills | Status: AC

## 2019-06-05 NOTE — Progress Notes (Signed)
06/05/2019                                    Endocrinology Telehealth Visit Follow up Note -During COVID -19 Pandemic  I connected with Sabrina [name] on 06/05/2019   by telephone and verified that I am speaking with the correct person using two identifiers. [place], [birth year]. she has verbally consented to this visit. All issues noted in this document were discussed and addressed. The format was not optimal for physical exam.                      Subjective:    Patient ID: Sabrina [name], female    DOB: [birth year], PCP Fayrene Helper, MD   Past Medical History:  Diagnosis Date  . Allergy   . Anemia   . Arthritis   . Asthma   . Chronic bronchitis ([place])   . Connective tissue disease ([place])   . Diabetes mellitus   . Endometriosis   . Fibroid uterus   . GERD (gastroesophageal reflux disease)   . History of Doppler ultrasound    a. Carotid US 8/08:Estimated stenosis in the right and left internal carotid arteries is 0-50% and 0-50%.  . History of echocardiogram    a. Echo 6/16: EF 60-65%, no RWMA.  . History of hiatal hernia   . History of nuclear stress test    a. Myoview 6/16: There is no resting or stress perfusion defect consistent with no prior infarct and no ischemia. EF 73 %  The patient was hypertensive prior and during the study. Cardiac cath 04/2016 showed normal coronary arteries  . Hx of iron deficiency anemia   . Hypersomnia with sleep apnea   . Hypertension   . Lupus ([place]) 07/2013   Of Skin  . Menorrhagia   . Obesity   . Pneumonia   . 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 Secondary hyperthyroidism   . Thyroid disease   . Tinnitus    left ear  . Tubular adenoma of colon   . Vertigo   . Vitamin D deficiency    Past Surgical History:  Procedure Laterality Date  . ABDOMINAL HYSTERECTOMY  2004   supracervical hysterectomy  . BREAST SURGERY  2005   reduction  . BREATH TEK H PYLORI [name] 12/24/2014   Procedure: BREATH TEK H  PYLORI;  Surgeon: Greer Pickerel, MD;  Location: [facility];  Service: General;  Laterality: [name];  . CARDIAC CATHETERIZATION [name] 03/22/2016   Procedure: Right/Left Heart Cath and Coronary Angiography;  Surgeon: Troy Sine, MD;  Location: [facility];  Service: Cardiovascular;  Laterality: [name];  . DILATION AND CURETTAGE OF UTERUS     age 58  . ESOPHAGOGASTRODUODENOSCOPY (EGD) WITH PROPOFOL [name] 10/14/2015   Procedure: ESOPHAGOGASTRODUODENOSCOPY (EGD) WITH PROPOFOL;  Surgeon: Clarene Essex, MD;  Location: [facility];  Service: Endoscopy;  Laterality: [name];  . LAPAROSCOPIC GASTRIC SLEEVE RESECTION WITH HIATAL HERNIA REPAIR [name] 07/05/2015   Procedure: LAPAROSCOPIC GASTRIC SLEEVE RESECTION WITH HIATAL HERNIA REPAIR, upper endoscopy;  Surgeon: Greer Pickerel, MD;  Location: [facility];  Service: General;  Laterality: [name];  . REDUCTION MAMMAPLASTY Bilateral   . RIGHT HEART CATH [name] 10/13/2018   Procedure: RIGHT HEART CATH;  Surgeon: Larey Dresser, MD;  Location: [facility];  Service: Cardiovascular;  Laterality: [name];   Social History   Socioeconomic History  .  Marital status: Married    Spouse name: Not on file  . Number of children: Not on file  . Years of education: 21  . Highest education level: Not on file  Occupational History  . Occupation: Therapist, music: Radio broadcast assistant  Social Needs  . Financial resource strain: Not on file  . Food insecurity    Worry: Not on file    Inability: Not on file  . Transportation needs    Medical: Not on file    Non-medical: Not on file  Tobacco Use  . Smoking status: Never Smoker  . Smokeless tobacco: Never Used  Substance and Sexual Activity  . Alcohol use: No    Alcohol/week: 0.0 standard drinks  . Drug use: No  . Sexual activity: Yes    Partners: Male    Birth control/protection: Surgical    Comment: hysterectomy (supracervical)  Lifestyle  . Physical activity    Days per week: Not on file    Minutes  per session: Not on file  . Stress: Not on file  Relationships  . Social Herbalist on phone: Not on file    Gets together: Not on file    Attends religious service: Not on file    Active member of club or organization: Not on file    Attends meetings of clubs or organizations: Not on file    Relationship status: Not on file  Other Topics Concern  . Not on file  Social History Narrative   Regular exercise-no   Caffeine Use-no      [facility]: Patient lives with her husband. Works for [employer]. No bird or mold exposure. No pets currently.         Outpatient Encounter Medications as of 06/05/2019  Medication Sig  . acetaminophen (TYLENOL) 325 MG tablet Take 2 tablets (650 mg total) by mouth every 6 (six) hours as needed for mild pain (or Fever >/= 101).  [name] ambrisentan (LETAIRIS) 5 MG tablet Take 1 tablet (5 mg total) by mouth daily.  [name] aspirin EC 81 MG tablet Take 1 tablet (81 mg total) by mouth daily with breakfast.  . bisoprolol (ZEBETA) 5 MG tablet Take 0.5 tablets (2.5 mg total) by mouth daily.  . calcium carbonate (OS-CAL - DOSED IN MG OF ELEMENTAL CALCIUM) 1250 (500 Ca) MG tablet Take 3 tablets (1,500 mg of elemental calcium total) by mouth 3 (three) times daily with meals.  [name] levothyroxine (SYNTHROID) 75 MCG tablet TAKE 1 TABLET (75 MCG TOTAL) BY MOUTH DAILY BEFORE BREAKFAST.  . Magnesium Oxide 250 MG TABS Take 1 tablet by mouth daily with breakfast.  . Multiple Vitamin (MULTIVITAMIN WITH MINERALS) TABS tablet Take 1 tablet by mouth daily.  . mycophenolate (MYFORTIC) 360 MG TBEC EC tablet Take 720 mg by mouth 2 (two) times daily.   . nitroGLYCERIN (NITROSTAT) 0.4 MG SL tablet Place 0.4 mg under the tongue every 5 (five) minutes x 3 doses as needed for chest pain.   . pantoprazole (PROTONIX) 20 MG tablet Take 20 mg by mouth See admin instructions. Take 1 tablet (20 mg) by mouth scheduled each morning, may take an additional dose in the evening if  needed for heartburn/indigestion.  . potassium chloride (K-DUR) 10 MEQ tablet Take 2 tablets (20 mEq total) by mouth every morning AND 2 tablets (20 mEq total) every evening.  . Selexipag (UPTRAVI) 1200 MCG TABS Take 1,200 mcg by mouth every 12 (twelve) hours.  [name]  senna-docusate (SENOKOT-S) 8.6-50 MG tablet Take 2 tablets by mouth at bedtime.  . tadalafil, PAH, (ADCIRCA) 20 MG tablet Take 1 tablet (20 mg total) by mouth daily.  . temazepam (RESTORIL) 15 MG capsule Take 1 capsule (15 mg total) by mouth at bedtime as needed for sleep.  [name] torsemide (DEMADEX) 20 MG tablet 60 mg in the AM and 40 mg in the PM  . triamcinolone ointment (KENALOG) 0.1 % Apply 1 application topically 2 (two) times daily as needed (dry/irritated skin.). APPLY AS DIRECTED TO ITCHING SPOTS ON THE BODY DAILY TO TWICE DAILY AS NEEDED. NOT TO FACE.   [name] UNABLE TO FIND Overnight Pulse Oximetry (to evaluate the need for continued nocturnal oxygen)  DX I27.20  . Vitamin D, Ergocalciferol, (DRISDOL) 1.25 MG (50000 UT) CAPS capsule Take 1 capsule (50,000 Units total) by mouth every Thursday.  . [DISCONTINUED] calcium carbonate (OS-CAL - DOSED IN MG OF ELEMENTAL CALCIUM) 1250 (500 Ca) MG tablet Take 2 tablets (1,000 mg of elemental calcium total) by mouth 3 (three) times daily with meals.  . [DISCONTINUED] potassium chloride (K-DUR) 10 MEQ tablet Take 2 tablets by mouth twice daily   No facility-administered encounter medications on file as of 06/05/2019.    ALLERGIES: Allergies  Allergen Reactions  . Peanut Oil Swelling  . Potassium Iodide Other (See Comments)    facial swelling, also eyes, and ears  . Ace Inhibitors Cough  . Doxycycline Hives  . Peanut-Containing Drug Products Nausea And Vomiting  . Amlodipine Rash  . Codeine Nausea And Vomiting  . Latex Hives and Rash  . Penicillins Hives, Itching and Rash    .Marland KitchenHas patient had a PCN reaction causing immediate rash, facial/tongue/throat swelling, SOB or lightheadedness with  hypotension: No Has patient had a PCN reaction causing severe rash involving mucus membranes or skin necrosis: No Has patient had a PCN reaction that required hospitalization No Has patient had a PCN reaction occurring within the last 10 years: YES (3 years ago)  If all of the above answers are "NO", then may proceed with Cephalosporin use.     VACCINATION STATUS: Immunization History  Administered Date(s) Administered  . Influenza Split 06/22/2011, 05/13/2018  . Influenza Whole 07/11/2006  . Influenza,inj,Quad PF,6+ Mos 06/26/2013, 05/11/2014, 06/13/2015, 05/20/2017  . Influenza-Unspecified 06/26/2013, 05/11/2014, 06/13/2015, 06/06/2016, 05/20/2017  . Pneumococcal Conjugate-13 02/07/2015  . Pneumococcal Polysaccharide-23 06/26/2013  . Td 04/11/2004  . Tdap 05/09/2015    HPI   58 year old female patient with medical history as above. -She is returning to follow-up for her history of hypoglycemia and hypothyroidism.  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  She remains  on a regular supplement with calcium, magnesium, and vitamin D.    Did have a series of hospitalizations for hypercalcemia this year.  Since her last visit, did not elicit any ER.  Her previsit labs show low calcium of 6.4.  -She did not have symptoms.  -She had scleroderma, on various modalities of treatment.  She has hypomagnesemia on ongoing supplement. -She has history of multinodular goiter with biopsy of one of her nodules consistent with hyperblastic nodule.  Repeat thyroid ultrasound on March 13, 2017 was remarkable for another left lobe nodule 1.5 cm with benign features. She did not show up for her appointment for surveillance thyroid ultrasound.  -She is on levothyroxine 75 mcg for hypothyroidism, reports compliance with this medication -She denies dysphagia, shortness of breath, voice change. - She has a history of prolonged  QT interval.  She has no new complaints  today.  Review of Systems  Limited as above.  Objective:    LMP 10/31/2002   Wt Readings from Last 3 Encounters:  04/30/19 190 lb (86.2 kg)  04/23/19 188 lb 0.8 oz (85.3 kg)  04/22/19 191 lb 12.8 oz (87 kg)     CMP     Component Value Date/Time   NA 144 06/02/2019 0736   NA 142 09/30/2018 1409   K 4.0 06/02/2019 0736   CL 101 06/02/2019 0736   CO2 31 06/02/2019 0736   GLUCOSE 87 06/02/2019 0736   BUN 41 (H) 06/02/2019 0736   BUN 23 09/30/2018 1409   CREATININE 1.72 (H) 06/02/2019 0736   CALCIUM 6.4 (L) 06/02/2019 0736   CALCIUM 7.8 (L) 12/02/2008 2239   PROT 6.4 06/02/2019 0736   ALBUMIN 3.5 04/23/2019 0439   AST 12 06/02/2019 0736   ALT 9 06/02/2019 0736   ALKPHOS 53 04/23/2019 0439   BILITOT 0.4 06/02/2019 0736   GFRNONAA 33 (L) 06/02/2019 0736   GFRAA 38 (L) 06/02/2019 0736     Diabetic Labs (most recent): Lab Results  Component Value Date   HGBA1C 6.2 (H) 04/22/2019   HGBA1C 5.7 (H) 12/23/2017   HGBA1C 5.3 12/11/2016     Lipid Panel ( most recent) Lipid Panel     Component Value Date/Time   CHOL 166 12/23/2017 0738   TRIG 109 12/23/2017 0738   HDL 35 (L) 12/23/2017 0738   CHOLHDL 4.7 12/23/2017 0738   VLDL 18 12/11/2016 0743   LDLCALC 109 (H) 12/23/2017 0738     Assessment & Plan:   1. 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  Not due to hypoparathyroidism.  She has elevated PTH appropriately in response to hypocalcemia.  -She has maintained low normal calcium, currently 6.4, asymptomatic. -Continue to require supplement with high-dose oral calcium.  -Is advised to increase her calcium carbonate 1250 mg (500 mg elemental calcium),  To 3  tablets 3 times a day with meals.  This will supplement 4500 mg of elemental calcium daily. -She is advised to continue magnesium oxide 250 mg p.o. daily with breakfast. She is advised to lower her potassium supplement to 20 mg twice a day, her  current potassium is 5.     -She is advised to obtain labs in 5 weeks.   -She is advised to call clinic if she develops symptoms of hypoglycemia including cramps, tingling,.   2. hypothyroidism -Her recent thyroid function tests are consistent with appropriate replacement.   She is advised to continue levothyroxine 75 mcg p.o. daily before breakfast.    - We discussed about the correct intake of her thyroid hormone, on empty stomach at fasting, with water, separated by at least 30 minutes from breakfast and other medications,  and separated by more than 4 hours from calcium, iron, multivitamins, acid reflux medications (PPIs). -Patient is made aware of the fact that thyroid hormone replacement is needed for life, dose to be adjusted by periodic monitoring of thyroid function tests.   3.  Nodular goiter -Her thyroid ultrasound from August 2018 was remarkable for stable isthmus nodule at 1.8 cm and new 1.5 cm nodule on the left lobe with benign and non- suspicious features.  She missed her appointment for  surveillance thyroid/neck ultrasound.  She will be reconsidered for surveillance thyroid ultrasound after her next visit. -She will not require intervention at this time.  4.  Type 2 diabetes-controlled with A1c of 6.2%.  Not on medications currently.     - I advised patient to maintain close follow up with Fayrene Helper, MD for primary care needs.   Time for this visit: 15 minutes. Suellyn Saracino Lant  participated in the discussions, expressed understanding, and voiced agreement with the above plans.  All questions were answered to her satisfaction. she is encouraged to contact clinic should she have any questions or concerns prior to her return visit.    Follow up plan: Return in about 6 weeks (around 07/17/2019) for Follow up with Pre-visit Labs.  Glade Lloyd, MD Phone: [phone]  Fax: [phone]   06/05/2019, 12:31 PM

## 2019-06-22 ENCOUNTER — Other Ambulatory Visit: Payer: Self-pay

## 2019-06-22 MED ORDER — PANTOPRAZOLE SODIUM 20 MG PO TBEC: 20.0000 mg | DELAYED_RELEASE_TABLET | ORAL | 0 refills | Status: DC

## 2019-06-22 MED ORDER — POTASSIUM CHLORIDE CRYS ER 10 MEQ PO TBCR: EXTENDED_RELEASE_TABLET | ORAL | 3 refills | Status: DC

## 2019-07-14 ENCOUNTER — Other Ambulatory Visit: Payer: Self-pay | Admitting: "Endocrinology

## 2019-07-17 ENCOUNTER — Telehealth (HOSPITAL_COMMUNITY): Payer: Self-pay | Admitting: Cardiology

## 2019-07-17 NOTE — Telephone Encounter (Signed)
Medical clearance completed by Dr Aundra Dubin Patient is approved to proceed with procedure (extraction of multiple teeth with IV sedation) from a cardiac standpoint.   Faxed to [facility] [phone] Attn Dr Marzetta Merino

## 2019-07-27 ENCOUNTER — Ambulatory Visit (INDEPENDENT_AMBULATORY_CARE_PROVIDER_SITE_OTHER): Payer: 59 | Admitting: Family Medicine

## 2019-07-27 ENCOUNTER — Encounter: Payer: Self-pay | Admitting: Family Medicine

## 2019-07-27 ENCOUNTER — Other Ambulatory Visit: Payer: Self-pay

## 2019-07-27 VITALS — BP 126/68 | Ht 60.0 in | Wt 195.0 lb

## 2019-07-27 DIAGNOSIS — K529 Noninfective gastroenteritis and colitis, unspecified: Secondary | ICD-10-CM

## 2019-07-27 DIAGNOSIS — E559 Vitamin D deficiency, unspecified: Secondary | ICD-10-CM

## 2019-07-27 DIAGNOSIS — I272 Pulmonary hypertension, unspecified: Secondary | ICD-10-CM

## 2019-07-27 DIAGNOSIS — N183 Chronic kidney disease, stage 3 unspecified: Secondary | ICD-10-CM | POA: Diagnosis not present

## 2019-07-27 DIAGNOSIS — R7989 Other specified abnormal findings of blood chemistry: Secondary | ICD-10-CM

## 2019-07-27 DIAGNOSIS — I129 Hypertensive chronic kidney disease with stage 1 through stage 4 chronic kidney disease, or unspecified chronic kidney disease: Secondary | ICD-10-CM

## 2019-07-27 DIAGNOSIS — M359 Systemic involvement of connective tissue, unspecified: Secondary | ICD-10-CM

## 2019-07-27 DIAGNOSIS — I10 Essential (primary) hypertension: Secondary | ICD-10-CM

## 2019-07-27 DIAGNOSIS — I2721 Secondary pulmonary arterial hypertension: Secondary | ICD-10-CM

## 2019-07-27 NOTE — Progress Notes (Signed)
Virtual Visit via Telephone Note  I connected with Sabrina [name] on 07/27/19 at  4:00 PM EST by telephone and verified that I am speaking with the correct person using two identifiers.  Location: Patient: home Provider: office   I discussed the limitations, risks, security and privacy concerns of performing an evaluation and management service by telephone and the availability of in person appointments. I also discussed with the patient that there may be a patient responsible charge related to this service. The patient expressed understanding and agreed to proceed.   History of Present Illness: Had flu vaccine  3 days ago, loose watery stool  Up  To yesterday, took imodium, beginning to become formed, also has nausea, no vomit, thnks it may be food she had on the road, chicken States breathing is improving, not needing oxygen as before, less short of breath    Observations/Objective: BP 126/68   Ht 5' (1.524 m)   Wt 195 lb (88.5 kg)   LMP 10/31/2002   BMI 38.08 kg/m  Good communication with no confusion and intact memory. Alert and oriented x 3 No signs of respiratory distress during speech    Assessment and Plan: Essential hypertension Controlled, no change in medication   Pulmonary hypertension ([place]) Managed by [facility], reports improvement  On current regime, medication management per Pulmonary  PAH (pulmonary artery hypertension) ([place]) Per pulmonary managemen tto continue, pt reports symptomatic improvement   Vitamin D deficiency Continue weekly supplement Updated lab needed at/ before next visit.   Elevated TSH Managed by endo  Connective tissue disease, undifferentiated ([place]) Managed by [facility] rheumatology  Hypocalcemia Endocrine manages  CKD (chronic kidney disease) stage 3, GFR 30-59 ml/min Needs evaluation and mangemen by nephrology, requests local mD, will refer  Acute gastroenteritis 3 day h/o loose stool and nausea  Following ingestion of  suspect chicken, manage symptomatically, and ensure adequate hydration Update chem 7 and EGFR, long h/o hypokalemia     Follow Up Instructions:    I discussed the assessment and treatment plan with the patient. The patient was provided an opportunity to ask questions and all were answered. The patient agreed with the plan and demonstrated an understanding of the instructions.   The patient was advised to call back or seek an in-person evaluation if the symptoms worsen or if the condition fails to improve as anticipated.  I provided 20 minutes of non-face-to-face time during this encounter.   [name], MD

## 2019-07-27 NOTE — Patient Instructions (Addendum)
F/u in office wit MD in 5 months, call if you need me sooner  Please get non fasting chem 7 and eGFR tomorrow at [facility] ( stat)  You will be referred to Nephrology in [place] , as you  State you need specialty services there due to poor response to diuretic  Work excuse for today and 07/28/2019 to return on 07/29/2019   Best with dental work next week  Please schedule colonoscopy as soon as able   Thanks for choosing [facility], we consider it a privelige to serve you.

## 2019-07-30 ENCOUNTER — Other Ambulatory Visit: Payer: Self-pay | Admitting: "Endocrinology

## 2019-08-01 DIAGNOSIS — K529 Noninfective gastroenteritis and colitis, unspecified: Secondary | ICD-10-CM | POA: Insufficient documentation

## 2019-08-01 DIAGNOSIS — N183 Chronic kidney disease, stage 3 unspecified: Secondary | ICD-10-CM | POA: Insufficient documentation

## 2019-08-01 NOTE — Assessment & Plan Note (Signed)
Endocrine manages

## 2019-08-01 NOTE — Assessment & Plan Note (Signed)
Needs evaluation and mangemen by nephrology, requests local mD, will refer

## 2019-08-01 NOTE — Assessment & Plan Note (Signed)
Managed by [facility] rheumatology

## 2019-08-01 NOTE — Assessment & Plan Note (Signed)
Managed by [facility], reports improvement  On current regime, medication management per Pulmonary

## 2019-08-01 NOTE — Assessment & Plan Note (Signed)
3 day h/o loose stool and nausea  Following ingestion of suspect chicken, manage symptomatically, and ensure adequate hydration Update chem 7 and EGFR, long h/o hypokalemia

## 2019-08-01 NOTE — Assessment & Plan Note (Addendum)
Continue weekly supplement Updated lab needed at/ before next visit.

## 2019-08-01 NOTE — Assessment & Plan Note (Signed)
Per pulmonary managemen tto continue, pt reports symptomatic improvement

## 2019-08-01 NOTE — Assessment & Plan Note (Signed)
Managed by endo

## 2019-08-01 NOTE — Assessment & Plan Note (Signed)
Controlled, no change in medication  

## 2019-08-07 LAB — COMPLETE METABOLIC PANEL WITH GFR
AG Ratio: 1.3 (calc) (ref 1.0–2.5)
ALT: 12 U/L (ref 6–29)
AST: 13 U/L (ref 10–35)
Albumin: 3.7 g/dL (ref 3.6–5.1)
Alkaline phosphatase (APISO): 54 U/L (ref 37–153)
BUN/Creatinine Ratio: 20 (calc) (ref 6–22)
BUN: 37 mg/dL — ABNORMAL HIGH (ref 7–25)
CO2: 25 mmol/L (ref 20–32)
Calcium: 7.2 mg/dL — ABNORMAL LOW (ref 8.6–10.4)
Chloride: 104 mmol/L (ref 98–110)
Creat: 1.81 mg/dL — ABNORMAL HIGH (ref 0.50–1.05)
GFR, Est African American: 36 mL/min/{1.73_m2} — ABNORMAL LOW (ref >=60)
GFR, Est Non African American: 31 mL/min/{1.73_m2} — ABNORMAL LOW (ref >=60)
Globulin: 2.8 g/dL (calc) (ref 1.9–3.7)
Glucose, Bld: 91 mg/dL (ref 65–99)
Potassium: 4.6 mmol/L (ref 3.5–5.3)
Sodium: 143 mmol/L (ref 135–146)
Total Bilirubin: 0.4 mg/dL (ref 0.2–1.2)
Total Protein: 6.5 g/dL (ref 6.1–8.1)

## 2019-08-07 LAB — MAGNESIUM: Magnesium: 2.3 mg/dL (ref 1.5–2.5)

## 2019-08-07 LAB — TSH: TSH: 3.62 mIU/L (ref 0.40–4.50)

## 2019-08-07 LAB — T4, FREE: Free T4: 1.2 ng/dL (ref 0.8–1.8)

## 2019-08-10 ENCOUNTER — Ambulatory Visit: Payer: 59 | Admitting: Family Medicine

## 2019-08-11 ENCOUNTER — Ambulatory Visit (INDEPENDENT_AMBULATORY_CARE_PROVIDER_SITE_OTHER): Payer: 59 | Admitting: "Endocrinology

## 2019-08-11 ENCOUNTER — Encounter: Payer: Self-pay | Admitting: "Endocrinology

## 2019-08-11 DIAGNOSIS — E876 Hypokalemia: Secondary | ICD-10-CM | POA: Diagnosis not present

## 2019-08-11 DIAGNOSIS — E039 Hypothyroidism, unspecified: Secondary | ICD-10-CM

## 2019-08-11 NOTE — Progress Notes (Signed)
08/11/2019                                    Endocrinology Telehealth Visit Follow up Note -During COVID -19 Pandemic  I connected with Sabrina [name] on 08/11/2019   by telephone and verified that I am speaking with the correct person using two identifiers. [place], 56-Oct-1962. she has verbally consented to this visit. All issues noted in this document were discussed and addressed. The format was not optimal for physical exam.                      Subjective:    Patient ID: Sabrina [name], female    DOB: 1961/04/16, PCP Fayrene Helper, MD   Past Medical History:  Diagnosis Date  . Allergy   . Anemia   . Arthritis   . Asthma   . Chronic bronchitis ([place])   . Connective tissue disease ([place])   . Diabetes mellitus   . Endometriosis   . Fibroid uterus   . GERD (gastroesophageal reflux disease)   . History of Doppler ultrasound    a. Carotid US 8/08:Estimated stenosis in the right and left internal carotid arteries is 0-50% and 0-50%.  . History of echocardiogram    a. Echo 6/16: EF 60-65%, no RWMA.  . History of hiatal hernia   . History of nuclear stress test    a. Myoview 6/16: There is no resting or stress perfusion defect consistent with no prior infarct and no ischemia. EF 73 %  The patient was hypertensive prior and during the study. Cardiac cath 04/2016 showed normal coronary arteries  . Hx of iron deficiency anemia   . Hypersomnia with sleep apnea   . Hypertension   . Lupus ([place]) 07/2013   Of Skin  . Menorrhagia   . Obesity   . Pneumonia   . 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 Secondary hyperthyroidism   . Thyroid disease   . Tinnitus    left ear  . Tubular adenoma of colon   . Vertigo   . Vitamin D deficiency    Past Surgical History:  Procedure Laterality Date  . ABDOMINAL HYSTERECTOMY  2004   supracervical hysterectomy  . BREAST SURGERY  2005   reduction  . BREATH TEK H PYLORI [name] 12/24/2014   Procedure: BREATH TEK H  PYLORI;  Surgeon: Greer Pickerel, MD;  Location: [facility];  Service: General;  Laterality: [name];  . CARDIAC CATHETERIZATION [name] 03/22/2016   Procedure: Right/Left Heart Cath and Coronary Angiography;  Surgeon: Troy Sine, MD;  Location: [facility];  Service: Cardiovascular;  Laterality: [name];  . DILATION AND CURETTAGE OF UTERUS     age 58  . ESOPHAGOGASTRODUODENOSCOPY (EGD) WITH PROPOFOL [name] 10/14/2015   Procedure: ESOPHAGOGASTRODUODENOSCOPY (EGD) WITH PROPOFOL;  Surgeon: Clarene Essex, MD;  Location: [facility];  Service: Endoscopy;  Laterality: [name];  . LAPAROSCOPIC GASTRIC SLEEVE RESECTION WITH HIATAL HERNIA REPAIR [name] 07/05/2015   Procedure: LAPAROSCOPIC GASTRIC SLEEVE RESECTION WITH HIATAL HERNIA REPAIR, upper endoscopy;  Surgeon: Greer Pickerel, MD;  Location: [facility];  Service: General;  Laterality: [name];  . REDUCTION MAMMAPLASTY Bilateral   . RIGHT HEART CATH [name] 10/13/2018   Procedure: RIGHT HEART CATH;  Surgeon: Larey Dresser, MD;  Location: [facility];  Service: Cardiovascular;  Laterality: [name];   Social History   Socioeconomic History  .  Marital status: Married    Spouse name: Not on file  . Number of children: Not on file  . Years of education: 48  . Highest education level: Not on file  Occupational History  . Occupation: Therapist, music: [employer]  Tobacco Use  . Smoking status: Never Smoker  . Smokeless tobacco: Never Used  Substance and Sexual Activity  . Alcohol use: No    Alcohol/week: 0.0 standard drinks  . Drug use: No  . Sexual activity: Yes    Partners: Male    Birth control/protection: Surgical    Comment: hysterectomy (supracervical)  Other Topics Concern  . Not on file  Social History Narrative   Regular exercise-no   Caffeine Use-no      [facility]: Patient lives with her husband. Works for [employer]. No bird or mold exposure. No pets currently.         Social Determinants of Health    Financial Resource Strain:   . Difficulty of Paying Living Expenses: Not on file  Food Insecurity:   . Worried About Charity fundraiser in the Last Year: Not on file  . Ran Out of Food in the Last Year: Not on file  Transportation Needs:   . Lack of Transportation (Medical): Not on file  . Lack of Transportation (Non-Medical): Not on file  Physical Activity:   . Days of Exercise per Week: Not on file  . Minutes of Exercise per Session: Not on file  Stress:   . Feeling of Stress : Not on file  Social Connections:   . Frequency of Communication with Friends and Family: Not on file  . Frequency of Social Gatherings with Friends and Family: Not on file  . Attends Religious Services: Not on file  . Active Member of Clubs or Organizations: Not on file  . Attends Archivist Meetings: Not on file  . Marital Status: Not on file   Outpatient Encounter Medications as of 08/11/2019  Medication Sig  . acetaminophen (TYLENOL) 325 MG tablet Take 2 tablets (650 mg total) by mouth every 6 (six) hours as needed for mild pain (or Fever >/= 101).  [name] ambrisentan (LETAIRIS) 5 MG tablet Take 1 tablet (5 mg total) by mouth daily.  [name] aspirin EC 81 MG tablet Take 1 tablet (81 mg total) by mouth daily with breakfast.  . bisoprolol (ZEBETA) 5 MG tablet Take 0.5 tablets (2.5 mg total) by mouth daily.  . calcium carbonate (OS-CAL - DOSED IN MG OF ELEMENTAL CALCIUM) 1250 (500 Ca) MG tablet Take 3 tablets (1,500 mg of elemental calcium total) by mouth 3 (three) times daily with meals.  [name] levothyroxine (SYNTHROID) 75 MCG tablet TAKE 1 TABLET (75 MCG TOTAL) BY MOUTH DAILY BEFORE BREAKFAST.  . Magnesium Oxide 250 MG TABS Take 1 tablet by mouth daily with breakfast.  . Multiple Vitamin (MULTIVITAMIN WITH MINERALS) TABS tablet Take 1 tablet by mouth daily.  . mycophenolate (MYFORTIC) 360 MG TBEC EC tablet Take 720 mg by mouth 2 (two) times daily.   . nitroGLYCERIN (NITROSTAT) 0.4 MG SL tablet Place 0.4 mg  under the tongue every 5 (five) minutes x 3 doses as needed for chest pain.   . pantoprazole (PROTONIX) 20 MG tablet TAKE 1 TABLET (20 MG) BY MOUTH EACH MORNING, MAY TAKE AN ADDITIONAL DOSE IN THE EVENING IF NEEDED FOR HEARTBURN/INDIGESTION.  [name] potassium chloride (KLOR-CON) 10 MEQ tablet Take 2 tablets (20 mEq total) by mouth every  morning AND 2 tablets (20 mEq total) every evening.  . Selexipag (UPTRAVI) 1200 MCG TABS Take 1,200 mcg by mouth every 12 (twelve) hours.  . senna-docusate (SENOKOT-S) 8.6-50 MG tablet Take 2 tablets by mouth at bedtime.  . tadalafil, PAH, (ADCIRCA) 20 MG tablet Take 1 tablet (20 mg total) by mouth daily.  . temazepam (RESTORIL) 15 MG capsule Take 1 capsule (15 mg total) by mouth at bedtime as needed for sleep.  [name] torsemide (DEMADEX) 20 MG tablet 60 mg in the AM and 40 mg in the PM  . triamcinolone ointment (KENALOG) 0.1 % Apply 1 application topically 2 (two) times daily as needed (dry/irritated skin.). APPLY AS DIRECTED TO ITCHING SPOTS ON THE BODY DAILY TO TWICE DAILY AS NEEDED. NOT TO FACE.   [name] UNABLE TO FIND Overnight Pulse Oximetry (to evaluate the need for continued nocturnal oxygen)  DX I27.20  . Vitamin D, Ergocalciferol, (DRISDOL) 1.25 MG (50000 UT) CAPS capsule Take 1 capsule (50,000 Units total) by mouth every Thursday.  . [DISCONTINUED] potassium chloride (K-DUR) 10 MEQ tablet Take 2 tablets by mouth twice daily   No facility-administered encounter medications on file as of 08/11/2019.   ALLERGIES: Allergies  Allergen Reactions  . Peanut Oil Swelling  . Potassium Iodide Other (See Comments)    facial swelling, also eyes, and ears  . Ace Inhibitors Cough  . Doxycycline Hives  . Peanut-Containing Drug Products Nausea And Vomiting  . Amlodipine Rash  . Codeine Nausea And Vomiting  . Latex Hives and Rash  . Penicillins Hives, Itching and Rash    .Marland KitchenHas patient had a PCN reaction causing immediate rash, facial/tongue/throat swelling, SOB or  lightheadedness with hypotension: No Has patient had a PCN reaction causing severe rash involving mucus membranes or skin necrosis: No Has patient had a PCN reaction that required hospitalization No Has patient had a PCN reaction occurring within the last 10 years: YES (3 years ago)  If all of the above answers are "NO", then may proceed with Cephalosporin use.     VACCINATION STATUS: Immunization History  Administered Date(s) Administered  . Influenza Split 06/22/2011, 05/13/2018  . Influenza Whole 07/11/2006  . Influenza,inj,Quad PF,6+ Mos 06/26/2013, 05/11/2014, 06/13/2015, 05/20/2017  . Influenza-Unspecified 06/26/2013, 05/11/2014, 06/13/2015, 06/06/2016, 05/20/2017  . Pneumococcal Conjugate-13 02/07/2015  . Pneumococcal Polysaccharide-23 06/26/2013  . Td 04/11/2004  . Tdap 05/09/2015    HPI   58 year old female patient with medical history as above. -She is being engaged and has been telephone in follow-up for hypercalcemia, hypothyroidism, hypokalemia.   She is on multiple supplements for electrolytes and thyroid hormone. 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  She remains  on a regular supplement with calcium, magnesium, and vitamin D.    Did have a series of hospitalizations for hypercalcemia this year.  Since her last visit, did not require any ER visit.     Her previsit labs show calcium improving to 7.2%.  She is on calcium carbonate 1250 mg 3 tablets p.o. 3 times daily AC.  She does not report any new symptoms today.  -She had scleroderma, on various modalities of treatment.  She has hypomagnesemia on ongoing supplement. -She has history of multinodular goiter with biopsy of one of her nodules consistent with hyperblastic nodule.  Repeat thyroid ultrasound on March 13, 2017 was remarkable for another left lobe nodule 1.5 cm with benign features. She did not show up for her appointment for surveillance thyroid ultrasound.  -She is  on  levothyroxine 75 mcg for hypothyroidism, reports compliance with this medication -She denies dysphagia, shortness of breath, voice change. - She has a history of prolonged QT interval.  She has no new complaints today.  Review of Systems  Limited as above.  Objective:    LMP 10/31/2002   Wt Readings from Last 3 Encounters:  07/27/19 195 lb (88.5 kg)  04/30/19 190 lb (86.2 kg)  04/23/19 188 lb 0.8 oz (85.3 kg)     CMP     Component Value Date/Time   NA 143 08/06/2019 0855   NA 142 09/30/2018 1409   K 4.6 08/06/2019 0855   CL 104 08/06/2019 0855   CO2 25 08/06/2019 0855   GLUCOSE 91 08/06/2019 0855   BUN 37 (H) 08/06/2019 0855   BUN 23 09/30/2018 1409   CREATININE 1.81 (H) 08/06/2019 0855   CALCIUM 7.2 (L) 08/06/2019 0855   CALCIUM 7.8 (L) 12/02/2008 2239   PROT 6.5 08/06/2019 0855   ALBUMIN 3.5 04/23/2019 0439   AST 13 08/06/2019 0855   ALT 12 08/06/2019 0855   ALKPHOS 53 04/23/2019 0439   BILITOT 0.4 08/06/2019 0855   GFRNONAA 31 (L) 08/06/2019 0855   GFRAA 36 (L) 08/06/2019 0855     Diabetic Labs (most recent): Lab Results  Component Value Date   HGBA1C 6.2 (H) 04/22/2019   HGBA1C 5.7 (H) 12/23/2017   HGBA1C 5.3 12/11/2016     Lipid Panel ( most recent) Lipid Panel     Component Value Date/Time   CHOL 166 12/23/2017 0738   TRIG 109 12/23/2017 0738   HDL 35 (L) 12/23/2017 0738   CHOLHDL 4.7 12/23/2017 0738   VLDL 18 12/11/2016 0743   LDLCALC 109 (H) 12/23/2017 0738     Assessment & Plan:   1. 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  Not due to hypoparathyroidism.  She has elevated PTH appropriately in response to hypocalcemia.  -She has maintained low normal calcium, currently 7.2 improving from 6.4, asymptomatic. -She will continue to require  supplement with high-dose oral calcium.  -She is advised to increase her calcium carbonate 1250 mg (500 mg elemental calcium),  To 3  tablets 3  times a day with meals.  This will supplement 4500 mg of elemental calcium daily. -She is advised to continue magnesium oxide 250 mg p.o. daily with breakfast. She is advised to lower her potassium supplement to 20 mg twice a day, her current potassium is 4.6.   -She is advised to call clinic if she develops symptoms of hypoglycemia including cramps, tingling,.   2. hypothyroidism -Her recent thyroid function tests are consistent with appropriate replacement.   She is advised to continue levothyroxine 75 mcg p.o. daily before breakfast.    - We discussed about the correct intake of her thyroid hormone, on empty stomach at fasting, with water, separated by at least 30 minutes from breakfast and other medications,  and separated by more than 4 hours from calcium, iron, multivitamins, acid reflux medications (PPIs). -Patient is made aware of the fact that thyroid hormone replacement is needed for life, dose to be adjusted by periodic monitoring of thyroid function tests.   3.  Nodular goiter -Her thyroid ultrasound from August 2018 was remarkable for stable isthmus nodule at 1.8 cm and new 1.5 cm nodule on the left lobe with benign and non- suspicious features.  She missed her appointment for  surveillance thyroid/neck ultrasound.  She will be reconsidered for  surveillance thyroid ultrasound after her next visit. -She will not require intervention at this time.  4.  Type 2 diabetes-controlled with A1c of 6.2%.  Not on medications currently.    - I advised patient to maintain close follow up with Fayrene Helper, MD for primary care needs.    Time for this visit: 15 minutes. Dasiya Helder Weakland  participated in the discussions, expressed understanding, and voiced agreement with the above plans.  All questions were answered to her satisfaction. she is encouraged to contact clinic should she have any questions or concerns prior to her return visit.   Follow up plan: Return in about 4  months (around 12/10/2019) for Follow up with Pre-visit Labs.  Glade Lloyd, MD Phone: [phone]  Fax: [phone]   08/11/2019, 12:54 PM

## 2019-08-17 ENCOUNTER — Emergency Department (HOSPITAL_COMMUNITY): Payer: 59

## 2019-08-17 ENCOUNTER — Other Ambulatory Visit: Payer: Self-pay

## 2019-08-17 ENCOUNTER — Inpatient Hospital Stay (HOSPITAL_COMMUNITY)
Admission: EM | Admit: 2019-08-17 | Discharge: 2019-08-20 | DRG: 286 | Payer: 59 | Attending: Cardiology | Admitting: Cardiology

## 2019-08-17 ENCOUNTER — Encounter (HOSPITAL_COMMUNITY): Payer: Self-pay | Admitting: Emergency Medicine

## 2019-08-17 ENCOUNTER — Telehealth (HOSPITAL_COMMUNITY): Payer: Self-pay

## 2019-08-17 ENCOUNTER — Encounter (HOSPITAL_COMMUNITY): Payer: 59 | Admitting: Cardiology

## 2019-08-17 DIAGNOSIS — Z8249 Family history of ischemic heart disease and other diseases of the circulatory system: Secondary | ICD-10-CM

## 2019-08-17 DIAGNOSIS — Z8349 Family history of other endocrine, nutritional and metabolic diseases: Secondary | ICD-10-CM

## 2019-08-17 DIAGNOSIS — J42 Unspecified chronic bronchitis: Secondary | ICD-10-CM | POA: Diagnosis present

## 2019-08-17 DIAGNOSIS — E559 Vitamin D deficiency, unspecified: Secondary | ICD-10-CM | POA: Diagnosis present

## 2019-08-17 DIAGNOSIS — I2721 Secondary pulmonary arterial hypertension: Secondary | ICD-10-CM

## 2019-08-17 DIAGNOSIS — Z79890 Hormone replacement therapy (postmenopausal): Secondary | ICD-10-CM

## 2019-08-17 DIAGNOSIS — E872 Acidosis, unspecified: Secondary | ICD-10-CM

## 2019-08-17 DIAGNOSIS — J9811 Atelectasis: Secondary | ICD-10-CM | POA: Diagnosis present

## 2019-08-17 DIAGNOSIS — E669 Obesity, unspecified: Secondary | ICD-10-CM | POA: Diagnosis present

## 2019-08-17 DIAGNOSIS — Z818 Family history of other mental and behavioral disorders: Secondary | ICD-10-CM

## 2019-08-17 DIAGNOSIS — E1122 Type 2 diabetes mellitus with diabetic chronic kidney disease: Secondary | ICD-10-CM | POA: Diagnosis present

## 2019-08-17 DIAGNOSIS — Z833 Family history of diabetes mellitus: Secondary | ICD-10-CM

## 2019-08-17 DIAGNOSIS — R0600 Dyspnea, unspecified: Secondary | ICD-10-CM | POA: Diagnosis not present

## 2019-08-17 DIAGNOSIS — I130 Hypertensive heart and chronic kidney disease with heart failure and stage 1 through stage 4 chronic kidney disease, or unspecified chronic kidney disease: Secondary | ICD-10-CM | POA: Diagnosis present

## 2019-08-17 DIAGNOSIS — M349 Systemic sclerosis, unspecified: Secondary | ICD-10-CM | POA: Diagnosis present

## 2019-08-17 DIAGNOSIS — R6520 Severe sepsis without septic shock: Secondary | ICD-10-CM

## 2019-08-17 DIAGNOSIS — J849 Interstitial pulmonary disease, unspecified: Secondary | ICD-10-CM | POA: Diagnosis present

## 2019-08-17 DIAGNOSIS — I5033 Acute on chronic diastolic (congestive) heart failure: Secondary | ICD-10-CM | POA: Diagnosis present

## 2019-08-17 DIAGNOSIS — N1832 Chronic kidney disease, stage 3b: Secondary | ICD-10-CM

## 2019-08-17 DIAGNOSIS — A419 Sepsis, unspecified organism: Secondary | ICD-10-CM | POA: Diagnosis present

## 2019-08-17 DIAGNOSIS — I509 Heart failure, unspecified: Secondary | ICD-10-CM | POA: Diagnosis not present

## 2019-08-17 DIAGNOSIS — N179 Acute kidney failure, unspecified: Secondary | ICD-10-CM | POA: Diagnosis not present

## 2019-08-17 DIAGNOSIS — Z803 Family history of malignant neoplasm of breast: Secondary | ICD-10-CM

## 2019-08-17 DIAGNOSIS — I7300 Raynaud's syndrome without gangrene: Secondary | ICD-10-CM | POA: Diagnosis present

## 2019-08-17 DIAGNOSIS — I272 Pulmonary hypertension, unspecified: Secondary | ICD-10-CM | POA: Diagnosis not present

## 2019-08-17 DIAGNOSIS — E039 Hypothyroidism, unspecified: Secondary | ICD-10-CM | POA: Diagnosis present

## 2019-08-17 DIAGNOSIS — Z20822 Contact with and (suspected) exposure to covid-19: Secondary | ICD-10-CM | POA: Diagnosis present

## 2019-08-17 DIAGNOSIS — I5082 Biventricular heart failure: Secondary | ICD-10-CM | POA: Diagnosis present

## 2019-08-17 DIAGNOSIS — I248 Other forms of acute ischemic heart disease: Secondary | ICD-10-CM | POA: Diagnosis present

## 2019-08-17 DIAGNOSIS — R6510 Systemic inflammatory response syndrome (SIRS) of non-infectious origin without acute organ dysfunction: Secondary | ICD-10-CM | POA: Diagnosis not present

## 2019-08-17 DIAGNOSIS — I2781 Cor pulmonale (chronic): Secondary | ICD-10-CM | POA: Diagnosis present

## 2019-08-17 DIAGNOSIS — Z6841 Body Mass Index (BMI) 40.0 and over, adult: Secondary | ICD-10-CM | POA: Diagnosis not present

## 2019-08-17 DIAGNOSIS — Z90711 Acquired absence of uterus with remaining cervical stump: Secondary | ICD-10-CM

## 2019-08-17 DIAGNOSIS — Q211 Atrial septal defect: Secondary | ICD-10-CM

## 2019-08-17 DIAGNOSIS — I959 Hypotension, unspecified: Secondary | ICD-10-CM | POA: Diagnosis not present

## 2019-08-17 DIAGNOSIS — R112 Nausea with vomiting, unspecified: Secondary | ICD-10-CM

## 2019-08-17 DIAGNOSIS — D638 Anemia in other chronic diseases classified elsewhere: Secondary | ICD-10-CM | POA: Diagnosis present

## 2019-08-17 DIAGNOSIS — Z7982 Long term (current) use of aspirin: Secondary | ICD-10-CM

## 2019-08-17 DIAGNOSIS — I4581 Long QT syndrome: Secondary | ICD-10-CM | POA: Diagnosis not present

## 2019-08-17 DIAGNOSIS — E11649 Type 2 diabetes mellitus with hypoglycemia without coma: Secondary | ICD-10-CM | POA: Diagnosis present

## 2019-08-17 DIAGNOSIS — Z9114 Patient's other noncompliance with medication regimen: Secondary | ICD-10-CM

## 2019-08-17 DIAGNOSIS — Z800 Family history of malignant neoplasm of digestive organs: Secondary | ICD-10-CM

## 2019-08-17 DIAGNOSIS — Z823 Family history of stroke: Secondary | ICD-10-CM

## 2019-08-17 DIAGNOSIS — E162 Hypoglycemia, unspecified: Secondary | ICD-10-CM | POA: Diagnosis not present

## 2019-08-17 DIAGNOSIS — Z79899 Other long term (current) drug therapy: Secondary | ICD-10-CM

## 2019-08-17 DIAGNOSIS — Z8261 Family history of arthritis: Secondary | ICD-10-CM

## 2019-08-17 HISTORY — DX: Type 2 diabetes mellitus without complications: E11.9

## 2019-08-17 HISTORY — DX: Systemic sclerosis, unspecified: M34.9

## 2019-08-17 HISTORY — DX: Patent foramen ovale: Q21.12

## 2019-08-17 HISTORY — DX: Sepsis, unspecified organism: A41.9

## 2019-08-17 HISTORY — DX: Acute on chronic diastolic (congestive) heart failure: I50.33

## 2019-08-17 HISTORY — DX: Interstitial pulmonary disease, unspecified: J84.9

## 2019-08-17 HISTORY — DX: Essential (primary) hypertension: I10

## 2019-08-17 HISTORY — DX: Secondary pulmonary arterial hypertension: I27.21

## 2019-08-17 HISTORY — DX: Long QT syndrome: I45.81

## 2019-08-17 HISTORY — DX: Right heart failure, unspecified: I50.810

## 2019-08-17 HISTORY — DX: Atrial septal defect: Q21.1

## 2019-08-17 HISTORY — DX: Severe sepsis without septic shock: R65.20

## 2019-08-17 LAB — COMPREHENSIVE METABOLIC PANEL
ALT: 67 U/L — ABNORMAL HIGH (ref 0–44)
AST: 91 U/L — ABNORMAL HIGH (ref 15–41)
Albumin: 3.6 g/dL (ref 3.5–5.0)
Alkaline Phosphatase: 82 U/L (ref 38–126)
BUN: 35 mg/dL — ABNORMAL HIGH (ref 6–20)
CO2: 15 mmol/L — ABNORMAL LOW (ref 22–32)
Calcium: 7.7 mg/dL — ABNORMAL LOW (ref 8.9–10.3)
Chloride: 106 mmol/L (ref 98–111)
Creatinine, Ser: 1.79 mg/dL — ABNORMAL HIGH (ref 0.44–1.00)
GFR calc Af Amer: 36 mL/min — ABNORMAL LOW (ref >60)
GFR calc non Af Amer: 31 mL/min — ABNORMAL LOW (ref >60)
Glucose, Bld: 58 mg/dL — ABNORMAL LOW (ref 70–99)
Potassium: 4.5 mmol/L (ref 3.5–5.1)
Sodium: 144 mmol/L (ref 135–145)
Total Bilirubin: 1.1 mg/dL (ref 0.3–1.2)
Total Protein: 7.9 g/dL (ref 6.5–8.1)

## 2019-08-17 LAB — CBC WITH DIFFERENTIAL/PLATELET
Abs Immature Granulocytes: 0.23 10*3/uL — ABNORMAL HIGH (ref 0.00–0.07)
Basophils Absolute: 0 10*3/uL (ref 0.0–0.1)
Basophils Relative: 0 %
Eosinophils Absolute: 0 10*3/uL (ref 0.0–0.5)
Eosinophils Relative: 0 %
HCT: 39.2 % (ref 36.0–46.0)
Hemoglobin: 11.1 g/dL — ABNORMAL LOW (ref 12.0–15.0)
Immature Granulocytes: 1 %
Lymphocytes Relative: 22 %
Lymphs Abs: 3.6 10*3/uL (ref 0.7–4.0)
MCH: 23.6 pg — ABNORMAL LOW (ref 26.0–34.0)
MCHC: 28.3 g/dL — ABNORMAL LOW (ref 30.0–36.0)
MCV: 83.4 fL (ref 80.0–100.0)
Monocytes Absolute: 1.2 10*3/uL — ABNORMAL HIGH (ref 0.1–1.0)
Monocytes Relative: 7 %
Neutro Abs: 11.6 10*3/uL — ABNORMAL HIGH (ref 1.7–7.7)
Neutrophils Relative %: 70 %
Platelets: 480 10*3/uL — ABNORMAL HIGH (ref 150–400)
RBC: 4.7 MIL/uL (ref 3.87–5.11)
RDW: 16 % — ABNORMAL HIGH (ref 11.5–15.5)
WBC: 16.7 10*3/uL — ABNORMAL HIGH (ref 4.0–10.5)
nRBC: 0.1 % (ref 0.0–0.2)

## 2019-08-17 LAB — LIPASE, BLOOD: Lipase: 45 U/L (ref 11–51)

## 2019-08-17 LAB — TROPONIN I (HIGH SENSITIVITY)
Troponin I (High Sensitivity): 238 ng/L (ref <18)
Troponin I (High Sensitivity): 285 ng/L (ref <18)

## 2019-08-17 LAB — CBG MONITORING, ED
Glucose-Capillary: 16 mg/dL — CL (ref 70–99)
Glucose-Capillary: 81 mg/dL (ref 70–99)
Glucose-Capillary: 12 mg/dL — CL (ref 70–99)
Glucose-Capillary: 89 mg/dL (ref 70–99)

## 2019-08-17 LAB — RESPIRATORY PANEL BY RT PCR (FLU A&B, COVID)
Influenza A by PCR: NEGATIVE
Influenza B by PCR: NEGATIVE
SARS Coronavirus 2 by RT PCR: NEGATIVE

## 2019-08-17 LAB — LACTIC ACID, PLASMA
Lactic Acid, Venous: 10.1 mmol/L (ref 0.5–1.9)
Lactic Acid, Venous: 11 mmol/L (ref 0.5–1.9)

## 2019-08-17 LAB — BRAIN NATRIURETIC PEPTIDE: B Natriuretic Peptide: 2304 pg/mL — ABNORMAL HIGH (ref 0.0–100.0)

## 2019-08-17 LAB — MAGNESIUM: Magnesium: 2.9 mg/dL — ABNORMAL HIGH (ref 1.7–2.4)

## 2019-08-17 MED ORDER — SENNOSIDES-DOCUSATE SODIUM 8.6-50 MG PO TABS
2.0000 | ORAL_TABLET | Freq: Every day | ORAL | Status: DC
  Administered 2019-08-18: 2 via ORAL
  Filled 2019-08-17 (×4): qty 2

## 2019-08-17 MED ORDER — LEVOTHYROXINE SODIUM 75 MCG PO TABS
75.0000 ug | ORAL_TABLET | Freq: Every day | ORAL | Status: DC
  Administered 2019-08-18 – 2019-08-20 (×2): 75 ug via ORAL
  Filled 2019-08-17 (×3): qty 1

## 2019-08-17 MED ORDER — AMBRISENTAN 5 MG PO TABS
5.0000 mg | ORAL_TABLET | Freq: Every day | ORAL | Status: DC
  Administered 2019-08-18 – 2019-08-20 (×2): 5 mg via ORAL
  Filled 2019-08-17 (×2): qty 1

## 2019-08-17 MED ORDER — VANCOMYCIN HCL 1250 MG/250ML IV SOLN
1250.0000 mg | INTRAVENOUS | Status: DC
  Administered 2019-08-18: 1250 mg via INTRAVENOUS
  Filled 2019-08-17 (×5): qty 250

## 2019-08-17 MED ORDER — MAGNESIUM OXIDE 400 (241.3 MG) MG PO TABS
200.0000 mg | ORAL_TABLET | Freq: Every day | ORAL | Status: DC
  Administered 2019-08-17 – 2019-08-20 (×4): 200 mg via ORAL
  Filled 2019-08-17: qty 1
  Filled 2019-08-17: qty 0.5
  Filled 2019-08-17 (×2): qty 1
  Filled 2019-08-17: qty 0.5
  Filled 2019-08-17: qty 1
  Filled 2019-08-17 (×2): qty 0.5

## 2019-08-17 MED ORDER — DEXTROSE 10 % IV SOLN: INTRAVENOUS | Status: DC

## 2019-08-17 MED ORDER — ACETAMINOPHEN 325 MG PO TABS
650.0000 mg | ORAL_TABLET | Freq: Four times a day (QID) | ORAL | Status: DC | PRN
  Administered 2019-08-18 (×3): 650 mg via ORAL
  Filled 2019-08-17 (×3): qty 2

## 2019-08-17 MED ORDER — SODIUM CHLORIDE 0.9 % IV SOLN: 250.0000 mL | INTRAVENOUS | Status: DC | PRN

## 2019-08-17 MED ORDER — HEPARIN SODIUM (PORCINE) 5000 UNIT/ML IJ SOLN
5000.0000 [IU] | Freq: Three times a day (TID) | INTRAMUSCULAR | Status: DC
  Administered 2019-08-17 – 2019-08-20 (×6): 5000 [IU] via SUBCUTANEOUS
  Filled 2019-08-17 (×7): qty 1

## 2019-08-17 MED ORDER — VANCOMYCIN HCL 1500 MG/300ML IV SOLN
1500.0000 mg | Freq: Once | INTRAVENOUS | Status: AC
  Administered 2019-08-17: 11:00:00 1500 mg via INTRAVENOUS
  Filled 2019-08-17: qty 300

## 2019-08-17 MED ORDER — DEXTROSE 50 % IV SOLN
INTRAVENOUS | Status: AC
  Filled 2019-08-17: qty 50

## 2019-08-17 MED ORDER — DEXTROSE 50 % IV SOLN
INTRAVENOUS | Status: AC
  Administered 2019-08-17: 50 mL via INTRAVENOUS
  Filled 2019-08-17: qty 50

## 2019-08-17 MED ORDER — ADULT MULTIVITAMIN W/MINERALS CH
1.0000 | ORAL_TABLET | Freq: Every day | ORAL | Status: DC
  Administered 2019-08-17 – 2019-08-20 (×3): 1 via ORAL
  Filled 2019-08-17 (×3): qty 1

## 2019-08-17 MED ORDER — FUROSEMIDE 10 MG/ML IJ SOLN
80.0000 mg | Freq: Once | INTRAMUSCULAR | Status: AC
  Administered 2019-08-17: 04:00:00 80 mg via INTRAVENOUS
  Filled 2019-08-17: qty 8

## 2019-08-17 MED ORDER — ACETAMINOPHEN 650 MG RE SUPP: 650.0000 mg | Freq: Four times a day (QID) | RECTAL | Status: DC | PRN

## 2019-08-17 MED ORDER — ASPIRIN EC 81 MG PO TBEC
81.0000 mg | DELAYED_RELEASE_TABLET | Freq: Every day | ORAL | Status: DC
  Administered 2019-08-18 – 2019-08-20 (×2): 81 mg via ORAL
  Filled 2019-08-17 (×2): qty 1

## 2019-08-17 MED ORDER — DEXTROSE 50 % IV SOLN: 25.0000 g | Freq: Once | INTRAVENOUS | Status: AC

## 2019-08-17 MED ORDER — PANTOPRAZOLE SODIUM 20 MG PO TBEC: 20.0000 mg | DELAYED_RELEASE_TABLET | Freq: Every day | ORAL | Status: DC

## 2019-08-17 MED ORDER — SELEXIPAG 1200 MCG PO TABS
1200.0000 ug | ORAL_TABLET | Freq: Two times a day (BID) | ORAL | Status: DC
  Administered 2019-08-18 – 2019-08-20 (×4): 1200 ug via ORAL
  Filled 2019-08-17 (×4): qty 1

## 2019-08-17 MED ORDER — MYCOPHENOLATE SODIUM 180 MG PO TBEC
720.0000 mg | DELAYED_RELEASE_TABLET | Freq: Two times a day (BID) | ORAL | Status: DC
  Administered 2019-08-17 – 2019-08-20 (×5): 720 mg via ORAL
  Filled 2019-08-17 (×9): qty 4

## 2019-08-17 MED ORDER — NITROGLYCERIN 0.4 MG SL SUBL: 0.4000 mg | SUBLINGUAL_TABLET | SUBLINGUAL | Status: DC | PRN

## 2019-08-17 MED ORDER — CALCIUM CARBONATE 1250 (500 CA) MG PO TABS
3.0000 | ORAL_TABLET | Freq: Three times a day (TID) | ORAL | Status: DC
  Administered 2019-08-17 – 2019-08-20 (×3): 1500 mg via ORAL
  Filled 2019-08-17: qty 3
  Filled 2019-08-17 (×2): qty 2
  Filled 2019-08-17 (×5): qty 3
  Filled 2019-08-17: qty 2
  Filled 2019-08-17 (×4): qty 3

## 2019-08-17 MED ORDER — PANTOPRAZOLE SODIUM 20 MG PO TBEC
20.0000 mg | DELAYED_RELEASE_TABLET | Freq: Every day | ORAL | Status: DC
  Administered 2019-08-18 – 2019-08-20 (×2): 20 mg via ORAL
  Filled 2019-08-17 (×4): qty 1

## 2019-08-17 MED ORDER — FUROSEMIDE 10 MG/ML IJ SOLN
80.0000 mg | Freq: Two times a day (BID) | INTRAMUSCULAR | Status: DC
  Administered 2019-08-17: 17:00:00 80 mg via INTRAVENOUS
  Filled 2019-08-17 (×2): qty 8

## 2019-08-17 MED ORDER — BISOPROLOL FUMARATE 5 MG PO TABS
2.5000 mg | ORAL_TABLET | Freq: Every day | ORAL | Status: DC
  Administered 2019-08-17: 17:00:00 2.5 mg via ORAL
  Filled 2019-08-17 (×3): qty 0.5

## 2019-08-17 MED ORDER — SODIUM CHLORIDE 0.9 % IV SOLN
2.0000 g | Freq: Two times a day (BID) | INTRAVENOUS | Status: DC
  Administered 2019-08-17 – 2019-08-20 (×6): 2 g via INTRAVENOUS
  Filled 2019-08-17 (×10): qty 2

## 2019-08-17 MED ORDER — TEMAZEPAM 15 MG PO CAPS: 15.0000 mg | ORAL_CAPSULE | Freq: Every evening | ORAL | Status: DC | PRN

## 2019-08-17 MED ORDER — PANTOPRAZOLE SODIUM 40 MG PO TBEC: 40.0000 mg | DELAYED_RELEASE_TABLET | Freq: Every day | ORAL | Status: DC

## 2019-08-17 MED ORDER — SODIUM CHLORIDE 0.9% FLUSH
3.0000 mL | Freq: Two times a day (BID) | INTRAVENOUS | Status: DC
  Administered 2019-08-17 – 2019-08-18 (×3): 3 mL via INTRAVENOUS

## 2019-08-17 MED ORDER — TADALAFIL (PAH) 20 MG PO TABS
20.0000 mg | ORAL_TABLET | Freq: Every day | ORAL | Status: DC
  Administered 2019-08-17: 20 mg via ORAL
  Filled 2019-08-17 (×4): qty 1

## 2019-08-17 MED ORDER — SODIUM CHLORIDE 0.9% FLUSH: 3.0000 mL | INTRAVENOUS | Status: DC | PRN

## 2019-08-17 NOTE — Progress Notes (Signed)
Pharmacy Antibiotic Note  Sabrina [name] is a 59 y.o. female admitted on 08/17/2019 with sepsis.  Pharmacy has been consulted for Vancomycin and cefepime dosing.  Plan: Vancomycin 1500mg  loading dose, then 1250mg  IV every 24 hours.  Goal trough 15-20 mcg/mL.  F/u cxs and clinical progress Monitor V/S, labs and levels as indicated  Height: 5' (152.4 cm) Weight: 197 lb (89.4 kg) IBW/kg (Calculated) : 45.5  Temp (24hrs), Avg:99.3 F (37.4 C), Min:99.3 F (37.4 C), Max:99.3 F (37.4 C)  Recent Labs  Lab 08/17/19 0421 08/17/19 0630  WBC 16.7*  --   CREATININE 1.79*  --   LATICACIDVEN 10.1* 11.0*    Estimated Creatinine Clearance: 35 mL/min (A) (by C-G formula based on SCr of 1.79 mg/dL (H)).    Allergies  Allergen Reactions  . Peanut Oil Swelling  . Potassium Iodide Other (See Comments)    facial swelling, also eyes, and ears  . Ace Inhibitors Cough  . Doxycycline Hives  . Peanut-Containing Drug Products Nausea And Vomiting  . Amlodipine Rash  . Codeine Nausea And Vomiting  . Latex Hives and Rash  . Penicillins Hives, Itching and Rash    .Marland KitchenHas patient had a PCN reaction causing immediate rash, facial/tongue/throat swelling, SOB or lightheadedness with hypotension: No Has patient had a PCN reaction causing severe rash involving mucus membranes or skin necrosis: No Has patient had a PCN reaction that required hospitalization No Has patient had a PCN reaction occurring within the last 10 years: YES (3 years ago)  If all of the above answers are "NO", then may proceed with Cephalosporin use.     Antimicrobials this admission: Vancomycin 1/4 >>  Cefepime 1/4 >>   Dose adjustments this admission: prn  Microbiology results: 1/4 BCx: pending 1/4 UCx: pending 1/4 MRSA PCR: pending 1/4 SARS-2 CV pending  Thank you for allowing pharmacy to be a part of this patient's care.  Isac Sarna, BS [place], [place] Clinical Pharmacist Pager [phone] 08/17/2019 7:39 AM

## 2019-08-17 NOTE — Telephone Encounter (Signed)
Received message from office staff Barnett Applebaum that she received a call from patients husband this morning.  He states patient was taken to ED at [facility] for CHF symptoms lastnight (pt had office visit this morning).  He reports that patient will be transferred to [facility] today.  Dr Aundra Dubin made aware of same.  He will be on alert for patients arrival to [facility].

## 2019-08-17 NOTE — ED Notes (Addendum)
Dr Tomi Bamberger made aware pt CBG 15 D10 given  CBG recheck @ 6am

## 2019-08-17 NOTE — Consult Note (Addendum)
Advanced Heart Failure Team Consult Note   Primary Physician: Fayrene Helper, MD PCP-Cardiologist:  Loralie Champagne, MD  Rheumatology: [facility]  Reason for Consultation: Pulmonary Hypertension   HPI:    Sabrina [name] is seen today for evaluation of pulmonary hypertension at the request of Dr Domenic Polite.   Sabrina [name] is a 59 year old with a history of hypocalcemia, PFO, pulmonary hypertension, ILD, Raynauds, scleroderma, and RV dysfunction. PAH has been treated with Uptravi 1200 mcg+ ambrisentan 5 mg + adcirca 20 mg daily.   PAH thought to be related to scleroderma.  - V/Q scan (2017): No evidence for chronic PE.  - Sleep study (2017): Negative - Echo (1/20, [facility]): EF >55%, moderately dilated RV with moderately decreased systolic function, moderate TR, positive bubble study.  - RHC (3/20): mean RA 11, PA 128/35 mean 69, mean PCWP 15, CI 2.32, PVR 12.6 WU.  - Cannot tolerate 10 mg ambrisentan due to edema.  - Echo (9/20): EF 60-65%, severe RV dilation with moderately decreased systolic function and D-shaped septum, PA systolic pressure 99 mmHg.    Presented to [facility] with increased shortness of breath. Over the last few days her weight has been going up --->10 pounds. She had been taking torsemide 40 mg twice a day on days she was not working and 20 mg twice a day on days she is working. Says she was extremely short of breath and only able to take a couple of steps.  CXR was negative for infiltrates. Pertinent admission labs included  Lactic acid 10>11, WBC 16.7, HS Trop 238> 285 BNP 2304, SARS Cornavirus PCR negative. Blood cultures have been obtained on 1/4.  Started on antibiotics. Initially given IV fluid but later cut back 30 ml per hour.  Placed on IV lasix 80 mg twice a day. Transferred to [facility] this morning for HF/PAH.   Remains SOB with exertion. Mild dypsnea talking.   Review of Systems: [y] = yes, [ ]  = no   . General: Weight gain [ Y]; Weight loss [ ] ; Anorexia [ ] ;  Fatigue [Y]; Fever [ ] ; Chills [ ] ; Weakness [ ]   . Cardiac: Chest pain/pressure [ ] ; Resting SOB [ ] ; Exertional SOB [Y ]; Orthopnea [Y ]; Pedal Edema [Y ]; Palpitations [ ] ; Syncope [ ] ; Presyncope [ ] ; Paroxysmal nocturnal dyspnea[ ]   . Pulmonary: Cough [ ] ; Wheezing[ ] ; Hemoptysis[ ] ; Sputum [ ] ; Snoring [ ]   . GI: Vomiting[ ] ; Dysphagia[ ] ; Melena[ ] ; Hematochezia [ ] ; Heartburn[ ] ; Abdominal pain [ ] ; Constipation [ ] ; Diarrhea [ ] ; BRBPR [ ]   . GU: Hematuria[ ] ; Dysuria [ ] ; Nocturia[ ]   . Vascular: Pain in legs with walking [ ] ; Pain in feet with lying flat [ ] ; Non-healing sores [ ] ; Stroke [ ] ; TIA [ ] ; Slurred speech [ ] ;  . Neuro: Headaches[ ] ; Vertigo[ ] ; Seizures[ ] ; Paresthesias[ ] ;Blurred vision [ ] ; Diplopia [ ] ; Vision changes [ ]   . Ortho/Skin: Arthritis [ ] ; Joint pain [Y ]; Muscle pain [ ] ; Joint swelling [ ] ; Back Pain [Y ]; Rash [ ]   . Psych: Depression[ ] ; Anxiety[ ]   . Heme: Bleeding problems [ ] ; Clotting disorders [ ] ; Anemia [ ]   . Endocrine: Diabetes [ ] ; Thyroid dysfunction[ Y]  Home Medications Prior to Admission medications   Medication Sig Start Date End Date Taking? Authorizing Provider  ambrisentan (LETAIRIS) 5 MG tablet Take 1 tablet (5 mg total) by mouth daily. 12/08/18  Yes  Larey Dresser, MD  bisoprolol (ZEBETA) 5 MG tablet Take 0.5 tablets (2.5 mg total) by mouth daily. 03/12/19  Yes Larey Dresser, MD  calcium carbonate (OS-CAL - DOSED IN MG OF ELEMENTAL CALCIUM) 1250 (500 Ca) MG tablet Take 3 tablets (1,500 mg of elemental calcium total) by mouth 3 (three) times daily with meals. 06/05/19  Yes Nida, Marella Chimes, MD  levothyroxine (SYNTHROID) 75 MCG tablet TAKE 1 TABLET (75 MCG TOTAL) BY MOUTH DAILY BEFORE BREAKFAST. 03/03/19  Yes Nida, Marella Chimes, MD  Magnesium Oxide 250 MG TABS Take 1 tablet by mouth daily with breakfast.   Yes [provider]  mycophenolate (MYFORTIC) 360 MG TBEC EC tablet Take 720 mg by mouth 2 (two) times daily.   04/04/18  Yes [provider]  potassium chloride (KLOR-CON) 10 MEQ tablet Take 2 tablets (20 mEq total) by mouth every morning AND 2 tablets (20 mEq total) every evening. 06/22/19  Yes Nida, Marella Chimes, MD  Selexipag (UPTRAVI) 1200 MCG TABS Take 1,200 mcg by mouth every 12 (twelve) hours. 01/22/19  Yes Larey Dresser, MD  senna-docusate (SENOKOT-S) 8.6-50 MG tablet Take 2 tablets by mouth at bedtime. 04/23/19  Yes Emokpae, Courage, MD  torsemide (DEMADEX) 20 MG tablet 60 mg in the AM and 40 mg in the PM Patient taking differently: Take 40 mg by mouth 2 (two) times daily. 60 mg in the AM and 40 mg in the PM 04/23/19  Yes Emokpae, Courage, MD  Vitamin D, Ergocalciferol, (DRISDOL) 1.25 MG (50000 UT) CAPS capsule Take 1 capsule (50,000 Units total) by mouth every Thursday. Patient taking differently: Take 50,000 Units by mouth every 7 (seven) days.  04/30/19  Yes Roxan Hockey, MD  acetaminophen (TYLENOL) 325 MG tablet Take 2 tablets (650 mg total) by mouth every 6 (six) hours as needed for mild pain (or Fever >/= 101). 04/23/19   Roxan Hockey, MD  aspirin EC 81 MG tablet Take 1 tablet (81 mg total) by mouth daily with breakfast. 04/23/19 04/22/20  Roxan Hockey, MD  Multiple Vitamin (MULTIVITAMIN WITH MINERALS) TABS tablet Take 1 tablet by mouth daily.    [provider]  nitroGLYCERIN (NITROSTAT) 0.4 MG SL tablet Place 0.4 mg under the tongue every 5 (five) minutes x 3 doses as needed for chest pain.     [provider]  pantoprazole (PROTONIX) 20 MG tablet TAKE 1 TABLET (20 MG) BY MOUTH EACH MORNING, MAY TAKE AN ADDITIONAL DOSE IN THE EVENING IF NEEDED FOR HEARTBURN/INDIGESTION. 08/03/19   Cassandria Anger, MD  tadalafil, PAH, (ADCIRCA) 20 MG tablet Take 1 tablet (20 mg total) by mouth daily. 04/22/19   Larey Dresser, MD  temazepam (RESTORIL) 15 MG capsule Take 1 capsule (15 mg total) by mouth at bedtime as needed for sleep. 03/10/19   Fayrene Helper, MD    triamcinolone ointment (KENALOG) 0.1 % Apply 1 application topically 2 (two) times daily as needed (dry/irritated skin.). APPLY AS DIRECTED TO ITCHING SPOTS ON THE BODY DAILY TO TWICE DAILY AS NEEDED. NOT TO FACE.     [provider]  UNABLE TO FIND Overnight Pulse Oximetry (to evaluate the need for continued nocturnal oxygen)  DX I27.20 09/17/18   Fayrene Helper, MD  potassium chloride (K-DUR) 10 MEQ tablet Take 2 tablets by mouth twice daily 12/01/18 04/01/19  Fayrene Helper, MD    Past Medical History: Past Medical History:  Diagnosis Date  . Arthritis   . Asthma   . Chronic  bronchitis ([place])   . Endometriosis   . Essential hypertension   . Fibroid uterus   . GERD (gastroesophageal reflux disease)   . History of hiatal hernia   . History of nuclear stress test    a. Myoview 6/16: There is no resting or stress perfusion defect consistent with no prior infarct and no ischemia. EF 73 %  The patient was hypertensive prior and during the study. Cardiac cath 04/2016 showed normal coronary arteries  . Hx of iron deficiency anemia   . Hypersomnia with sleep apnea   . Interstitial lung disease ([place])   . Long Q-T syndrome    On beta-blocker  . Menorrhagia   . Obesity   . PFO (patent foramen ovale)   . Pneumonia   . Pulmonary arterial hypertension ([place])   . Right heart failure ([place])   . Scleroderma ([place])   . Secondary hyperthyroidism   . Tinnitus    Left  . Tubular adenoma of colon   . Type 2 diabetes mellitus ([place])   . Vertigo     Past Surgical History: Past Surgical History:  Procedure Laterality Date  . ABDOMINAL HYSTERECTOMY  2004   supracervical hysterectomy  . BREAST SURGERY  2005   reduction  . BREATH TEK H PYLORI [name] 12/24/2014   Procedure: BREATH TEK H PYLORI;  Surgeon: Greer Pickerel, MD;  Location: [facility];  Service: General;  Laterality: [name];  . CARDIAC CATHETERIZATION [name] 03/22/2016   Procedure: Right/Left Heart Cath and Coronary Angiography;   Surgeon: Troy Sine, MD;  Location: [facility];  Service: Cardiovascular;  Laterality: [name];  . DILATION AND CURETTAGE OF UTERUS     age 37  . ESOPHAGOGASTRODUODENOSCOPY (EGD) WITH PROPOFOL [name] 10/14/2015   Procedure: ESOPHAGOGASTRODUODENOSCOPY (EGD) WITH PROPOFOL;  Surgeon: Clarene Essex, MD;  Location: [facility];  Service: Endoscopy;  Laterality: [name];  . LAPAROSCOPIC GASTRIC SLEEVE RESECTION WITH HIATAL HERNIA REPAIR [name] 07/05/2015   Procedure: LAPAROSCOPIC GASTRIC SLEEVE RESECTION WITH HIATAL HERNIA REPAIR, upper endoscopy;  Surgeon: Greer Pickerel, MD;  Location: [facility];  Service: General;  Laterality: [name];  . REDUCTION MAMMAPLASTY Bilateral   . RIGHT HEART CATH [name] 10/13/2018   Procedure: RIGHT HEART CATH;  Surgeon: Larey Dresser, MD;  Location: [facility];  Service: Cardiovascular;  Laterality: [name];    Family History: Family History  Problem Relation Age of Onset  . Hypertension Mother   . Diabetes Mother   . Arthritis Mother   . Depression Mother   . CAD Mother        s/p PCI  . Diabetes Father   . Stroke Father   . Arthritis Father   . Depression Father   . Hypertension Father   . Cancer Father        prostate  . Hypertension Sister        x 2  . Breast cancer Sister   . Arthritis Sister   . Bleeding Disorder Sister   . Depression Sister   . Thyroid disease Sister   . Colon cancer Maternal Grandfather   . Stroke Maternal Aunt   . Heart attack Neg Hx     Social History: Social History   Socioeconomic History  . Marital status: Married    Spouse name: Not on file  . Number of children: Not on file  . Years of education: 34  . Highest education level: Not on file  Occupational History  . 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 Smoking status: Never Smoker  . Smokeless tobacco: Never Used  Substance and Sexual Activity  . Alcohol use: No    Alcohol/week: 0.0 standard drinks  . Drug use: No    . Sexual activity: Yes    Partners: Male    Birth control/protection: Surgical    Comment: hysterectomy (supracervical)  Other Topics Concern  . Not on file  Social History Narrative   Regular exercise-no   Caffeine Use-no      [facility]: Patient lives with her husband. Works for [employer]. No bird or mold exposure. No pets currently.         Social Determinants of Health   Financial Resource Strain:   . Difficulty of Paying Living Expenses: Not on file  Food Insecurity:   . Worried About Charity fundraiser in the Last Year: Not on file  . Ran Out of Food in the Last Year: Not on file  Transportation Needs:   . Lack of Transportation (Medical): Not on file  . Lack of Transportation (Non-Medical): Not on file  Physical Activity:   . Days of Exercise per Week: Not on file  . Minutes of Exercise per Session: Not on file  Stress:   . Feeling of Stress : Not on file  Social Connections:   . Frequency of Communication with Friends and Family: Not on file  . Frequency of Social Gatherings with Friends and Family: Not on file  . Attends Religious Services: Not on file  . Active Member of Clubs or Organizations: Not on file  . Attends Archivist Meetings: Not on file  . Marital Status: Not on file    Allergies:  Allergies  Allergen Reactions  . Peanut Oil Swelling  . Potassium Iodide Other (See Comments)    facial swelling, also eyes, and ears  . Ace Inhibitors Cough  . Doxycycline Hives  . Peanut-Containing Drug Products Nausea And Vomiting  . Amlodipine Rash  . Codeine Nausea And Vomiting  . Latex Hives and Rash  . Penicillins Hives, Itching and Rash    .Marland KitchenHas patient had a PCN reaction causing immediate rash, facial/tongue/throat swelling, SOB or lightheadedness with hypotension: No Has patient had a PCN reaction causing severe rash involving mucus membranes or skin necrosis: No Has patient had a PCN reaction that required hospitalization  No Has patient had a PCN reaction occurring within the last 10 years: YES (3 years ago)  If all of the above answers are "NO", then may proceed with Cephalosporin use.     Objective:    Vital Signs:   Temp:  [98.1 F (36.7 C)] 98.1 F (36.7 C) (01/05 0314) Pulse Rate:  [69-89] 71 (01/05 0314) Resp:  [16-24] 17 (01/05 0130) BP: (77-120)/(63-85) 77/63 (01/05 0314) SpO2:  [91 %-100 %] 91 % (01/05 0130)    Weight change: Filed Weights   08/17/19 0220  Weight: 89.4 kg    Intake/Output:   Intake/Output Summary (Last 24 hours) at 08/18/2019 0801 Last data filed at 08/18/2019 0035 Gross per 24 hour  Intake 1146.93 ml  Output --  Net 1146.93 ml      Physical Exam    General:   No resp difficulty HEENT: normal Neck: supple. JVP to jaw . Carotids 2+ bilat; no bruits. No lymphadenopathy or thyromegaly appreciated. Cor: PMI nondisplaced. Regular rate & rhythm. No rubs, gallops or murmurs. Lungs: clear Abdomen: soft, nontender, nondistended. No hepatosplenomegaly. No bruits or masses. Good  bowel sounds. Extremities: no cyanosis, clubbing, rash, edema Neuro: alert & orientedx3, cranial nerves grossly intact. moves all 4 extremities w/o difficulty. Affect pleasant   Telemetry   NSR 90s   EKG    SR RVH QT 532   Labs   Basic Metabolic Panel: Recent Labs  Lab 08/17/19 0421  NA 144  K 4.5  CL 106  CO2 15*  GLUCOSE 58*  BUN 35*  CREATININE 1.79*  CALCIUM 7.7*  MG 2.9*    Liver Function Tests: Recent Labs  Lab 08/17/19 0421  AST 91*  ALT 67*  ALKPHOS 82  BILITOT 1.1  PROT 7.9  ALBUMIN 3.6   Recent Labs  Lab 08/17/19 0421  LIPASE 45   No results for input(s): AMMONIA in the last 168 hours.  CBC: Recent Labs  Lab 08/17/19 0421  WBC 16.7*  NEUTROABS 11.6*  HGB 11.1*  HCT 39.2  MCV 83.4  PLT 480*    Cardiac Enzymes: No results for input(s): CKTOTAL, CKMB, CKMBINDEX, TROPONINI in the last 168 hours.  BNP: BNP (last 3 results) Recent Labs     03/27/19 1638 04/09/19 0921 08/17/19 0421  BNP 484.2* 494.5* 2,304.0*    ProBNP (last 3 results) No results for input(s): PROBNP in the last 8760 hours.   CBG: Recent Labs  Lab 08/18/19 0026 08/18/19 0159 08/18/19 0214 08/18/19 0336 08/18/19 0622  GLUCAP 119* 70 60* 102* 94    Coagulation Studies: No results for input(s): LABPROT, INR in the last 72 hours.   Imaging   No results found.   Medications:     Current Medications: . ambrisentan  5 mg Oral Daily  . aspirin EC  81 mg Oral Q breakfast  . bisoprolol  2.5 mg Oral Daily  . calcium carbonate  3 tablet Oral TID WC  . furosemide  80 mg Intravenous BID  . heparin  5,000 Units Subcutaneous Q8H  . levothyroxine  75 mcg Oral QAC breakfast  . magnesium oxide  200 mg Oral Q breakfast  . multivitamin with minerals  1 tablet Oral Daily  . mycophenolate  720 mg Oral BID  . pantoprazole  20 mg Oral Daily  . Selexipag  1,200 mcg Oral Q12H  . senna-docusate  2 tablet Oral QHS  . sodium chloride flush  3 mL Intravenous Q12H  . tadalafil (PAH)  20 mg Oral Daily    Infusions: . sodium chloride    . ceFEPime (MAXIPIME) IV Stopped (08/18/19 0035)  . dextrose 30 mL/hr at 08/17/19 1456  . vancomycin         Assessment/Plan   1. Dyspnea - multifactorial with PAH, RV failure  - Check CT without contrast.  -CXR negative for infiltrates.   -Initially on NRB but weaned to room air. At home she uses oxygen as needed.   2. Sepsis - Remains hypotensive. Hold bisoprolol for now.   -WBC 16.7 > pending  - Check Procalcitonin - Lactic Acid 10>11 -Respiratory Panel - negative - Blood Cultures obtained 08/16/18   - Afebrile  - On cefepime  3. A/C Cor pulmonale -ECHO EF 60-65% RV severely enlarged, moderately reduced D shaped septum.  BNP > 2300.  - Volume status improving. Continue to diurese with 80 mg IV lasix twice a day for now. May need to add milrinone to support RV. - - Follow renal function closely.   4. PAH  in the setting Scleroderma. WHO Group I- VQ In 2017, V/Q study was negative for chronic PE and sleep  study was negative. PFTs suggestive of restriction, has mild ILD on CT.  On maximum tolerated selexipag 1200 mcg twice a day + ambrisentan 5 mg daily (not tolerant 10 mg daily due to edema)+ adicrica 20 mg daily.  Continue for now unless SBP < 80.  -Will need RHC once diureses.   5. CKD Stage IIIb  Creatinine baseline 1.6-1.8 -Creatinine 1.8 on admit.  - Follow renal function closely.   6. Hypocalcemia  Calcium 7.7 on admit. On po calcium.   7. Scleroderma Followed at [facility]  8. PFO   9. Hypoglycemia Glucose < 10 last night. Receiving D10. Resolved.    Lactic Acid, CBC, BMET pending. Discussed with nursing staff at bedside strict I/Os, standing weights and BP parameters.   Length of Stay: 1  Amy Clegg, NP  08/18/2019, 8:01 AM  Advanced Heart Failure Team Pager [phone] (M-F; 7a - 4p)  Please contact [facility] for night-coverage after hours (4p -7a ) and weekends on [email or web address]  Patient seen with NP, agree with the above note.   She was admitted with worsening dyspnea and weight gain.  She had not been taking her torsemide regularly because of work and weight was rising.  She was admitted with severe dyspnea.  Lactate level was high.  She has been transferred down to [facility].  SBP in 80s, no lightheadedness.   Echo showed EF 60-65% with moderately decreased RV systolic function and severe RV enlargement.  D-shaped septum, PASP 99 mmHg.   ECG with NSR, RVH, long QT.   General: NAD Neck: JVP 14 cm, no thyromegaly or thyroid nodule.  Lungs: Clear to auscultation bilaterally with normal respiratory effort. CV: Nondisplaced PMI.  Heart regular S1/S2 with loud P2, no S3/S4, no murmur.  1+ edema to knees.  No carotid bruit.  Normal pedal pulses.  Abdomen: Soft, nontender, no hepatosplenomegaly, no distention.  Skin: Intact without lesions or rashes.    Neurologic: Alert and oriented x 3.  Psych: Normal affect. Extremities: No clubbing or cyanosis.  HEENT: Normal.    1. Acute on chronic diastolic CHF with prominent RV failure. Echo showed EF 60-65% with moderately decreased RV systolic function and severe RV enlargement.  D-shaped septum, PASP 99 mmHg. She had not been compliant with diuretics as outpatient.  She is very volume overloaded on exam.  - Lasix 80 mg IV bid, give dose now.  - Will give her midodrine 5 mg tid for soft BP for now.  - May need RV support with milrinone, NO, etc (see below).  2. CKD: stage 3.  Creatinine does not appear far off her baseline. Watch closely with diuresis.  3. Pulmonary hypertension: Pulmonary hypertension: Severe PAH with preserved cardiac output by cath in 3/20 (systemic pressure). 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 Has been known for several years. In 2017, V/Q study was negative for chronic PE and sleep study was negative. PFTs suggestive of restriction, has mild ILD on CT. Echo in 1/20 showed moderately dilated RV with moderately decreased systolic function. She is on ambrisentan 5 mg daily, developed significant edema at 10 mg daily.  Malvin Johns has been increased to 1200 mcg bid, she cannot tolerate higher dose.  She is on Adcirca 20 mg daily.  Significant RV failure as above.  - Continue ambrisentan 5 mg daily, will not increase.  - Continue selexipag 1200 mcg bid, cannot tolerate higher dose.  - Continue tadalafil 20 mg daily.  - Will plan RHC in am,  will likely leave Swan in place.  We discussed risks/benefits of procedure and she agrees.  May need NO or milrinone for RV support PH lowering.   - In future, think she is going to need parenteral treatment.  4. Long QT interval: May be worsened by hypocalcemia but also has history of possible LQTS.  - Bisoprolol held for now with low BP.  5. Interstitial lung disease: Stable on CT in 8/20, PFTs suggest restriction.  Likely related to scleroderma.  - Repeat noncontrast CT chest for progression and also to look for PNA.  6. ID: Afebrile, WBCs 17.   - She is on cefepime and vancomycin empirically, continue.  7. Scleroderma: +ANA, +anti-centromere antibody. Diagnosis x 10 years. Has associated Raynauds, mild ILD, and severe PAH.   Loralie Champagne 08/18/2019 11:17 AM  - Check procalcitonin.

## 2019-08-17 NOTE — H&P (Signed)
History and Physical    Sabrina [name] [id number] DOB: 12/27/1960 DOA: 08/17/2019  PCP: Fayrene Helper, MD  Cardiologist Dr. Aundra Dubin  Patient coming from: Home  Chief Complaint: Worsening dyspnea on exertion with swelling  HPI: Sabrina [name] is a 59 y.o. female with medical history significant for severe pulmonary hypertension with RV dysfunction, interstitial lung disease, hypothyroidism, connective tissue disease with scleroderma/Raynaud's, PFO, long QT syndrome, CKD stage IIIb, hypertension, and chronic hypocalcemia with vitamin D deficiency who presented to the ED with worsening dyspnea on exertion as well as swelling in her legs and abdominal area over the last 2 weeks.  This is despite being compliant with her home torsemide, aside from perhaps a few missed doses.  She has not had any chest pain and did have some nausea with 4 episodes of emesis prior to arrival.  She denies any cough, sore throat, rhinorrhea, or diarrhea.  Her dry weight is usually near 190 and she currently weighed herself at 197 pounds.   ED Course: Vital signs stable and patient was noted to be afebrile in the ED with EKG demonstrating sinus rhythm at 91 bpm.  Her white blood cell count was noted to be elevated at 16.7 and lactic acid 10.1 with glucose 58.  Hemoglobin 11.1.  Creatinine at 1.79 with baseline 1.4-1.5.  BNP noted to be 2304 and troponins of 238 and subsequently 285 noted.  EKG with no acute changes in sinus rhythm noted.  She has been seen by cardiology with plans to continue diuresis with Lasix 80 mg IV twice daily with plans for transfer to [facility] so she can be seen by heart failure team for further management and care once bed available.  She was started on some D10 IV fluid on account of her hypoglycemia appears to be less symptomatic now and is tolerating diet.  Review of Systems: All others reviewed as noted above and otherwise negative.  Past Medical History:  Diagnosis Date   . Arthritis   . Asthma   . Chronic bronchitis ([place])   . Endometriosis   . Essential hypertension   . Fibroid uterus   . GERD (gastroesophageal reflux disease)   . History of hiatal hernia   . History of nuclear stress test    a. Myoview 6/16: There is no resting or stress perfusion defect consistent with no prior infarct and no ischemia. EF 73 %  The patient was hypertensive prior and during the study. Cardiac cath 04/2016 showed normal coronary arteries  . Hx of iron deficiency anemia   . Hypersomnia with sleep apnea   . Interstitial lung disease ([place])   . Long Q-T syndrome    On beta-blocker  . Menorrhagia   . Obesity   . PFO (patent foramen ovale)   . Pneumonia   . Pulmonary arterial hypertension ([place])   . Right heart failure ([place])   . Scleroderma ([place])   . Secondary hyperthyroidism   . Tinnitus    Left  . Tubular adenoma of colon   . Type 2 diabetes mellitus ([place])   . Vertigo     Past Surgical History:  Procedure Laterality Date  . ABDOMINAL HYSTERECTOMY  2004   supracervical hysterectomy  . BREAST SURGERY  2005   reduction  . BREATH TEK H PYLORI [name] 12/24/2014   Procedure: BREATH TEK H PYLORI;  Surgeon: Greer Pickerel, MD;  Location: [facility];  Service: General;  Laterality: [name];  . CARDIAC CATHETERIZATION [name] 03/22/2016   Procedure: Right/Left  Heart Cath and Coronary Angiography;  Surgeon: Troy Sine, MD;  Location: [facility];  Service: Cardiovascular;  Laterality: [name];  . DILATION AND CURETTAGE OF UTERUS     age 58  . ESOPHAGOGASTRODUODENOSCOPY (EGD) WITH PROPOFOL [name] 10/14/2015   Procedure: ESOPHAGOGASTRODUODENOSCOPY (EGD) WITH PROPOFOL;  Surgeon: Clarene Essex, MD;  Location: [facility];  Service: Endoscopy;  Laterality: [name];  . LAPAROSCOPIC GASTRIC SLEEVE RESECTION WITH HIATAL HERNIA REPAIR [name] 07/05/2015   Procedure: LAPAROSCOPIC GASTRIC SLEEVE RESECTION WITH HIATAL HERNIA REPAIR, upper endoscopy;  Surgeon: Greer Pickerel, MD;  Location: [facility];   Service: General;  Laterality: [name];  . REDUCTION MAMMAPLASTY Bilateral   . RIGHT HEART CATH [name] 10/13/2018   Procedure: RIGHT HEART CATH;  Surgeon: Larey Dresser, MD;  Location: [facility];  Service: Cardiovascular;  Laterality: [name];     reports that she has never smoked. She has never used smokeless tobacco. She reports that she does not drink alcohol or use drugs.  Allergies  Allergen Reactions  . Peanut Oil Swelling  . Potassium Iodide Other (See Comments)    facial swelling, also eyes, and ears  . Ace Inhibitors Cough  . Doxycycline Hives  . Peanut-Containing Drug Products Nausea And Vomiting  . Amlodipine Rash  . Codeine Nausea And Vomiting  . Latex Hives and Rash  . Penicillins Hives, Itching and Rash    .Marland KitchenHas patient had a PCN reaction causing immediate rash, facial/tongue/throat swelling, SOB or lightheadedness with hypotension: No Has patient had a PCN reaction causing severe rash involving mucus membranes or skin necrosis: No Has patient had a PCN reaction that required hospitalization No Has patient had a PCN reaction occurring within the last 10 years: YES (3 years ago)  If all of the above answers are "NO", then may proceed with Cephalosporin use.     Family History  Problem Relation Age of Onset  . Hypertension Mother   . Diabetes Mother   . Arthritis Mother   . Depression Mother   . CAD Mother        s/p PCI  . Diabetes Father   . Stroke Father   . Arthritis Father   . Depression Father   . Hypertension Father   . Cancer Father        prostate  . Hypertension Sister        x 2  . Breast cancer Sister   . Arthritis Sister   . Bleeding Disorder Sister   . Depression Sister   . Thyroid disease Sister   . Colon cancer Maternal Grandfather   . Stroke Maternal Aunt   . Heart attack Neg Hx     Prior to Admission medications   Medication Sig Start Date End Date Taking? Authorizing Provider  ambrisentan (LETAIRIS) 5 MG tablet Take 1 tablet (5 mg  total) by mouth daily. 12/08/18  Yes Larey Dresser, MD  bisoprolol (ZEBETA) 5 MG tablet Take 0.5 tablets (2.5 mg total) by mouth daily. 03/12/19  Yes Larey Dresser, MD  calcium carbonate (OS-CAL - DOSED IN MG OF ELEMENTAL CALCIUM) 1250 (500 Ca) MG tablet Take 3 tablets (1,500 mg of elemental calcium total) by mouth 3 (three) times daily with meals. 06/05/19  Yes Nida, Marella Chimes, MD  levothyroxine (SYNTHROID) 75 MCG tablet TAKE 1 TABLET (75 MCG TOTAL) BY MOUTH DAILY BEFORE BREAKFAST. 03/03/19  Yes Nida, Marella Chimes, MD  Magnesium Oxide 250 MG TABS Take 1 tablet by mouth daily with breakfast.  Yes [provider]  mycophenolate (MYFORTIC) 360 MG TBEC EC tablet Take 720 mg by mouth 2 (two) times daily.  04/04/18  Yes [provider]  potassium chloride (KLOR-CON) 10 MEQ tablet Take 2 tablets (20 mEq total) by mouth every morning AND 2 tablets (20 mEq total) every evening. 06/22/19  Yes Nida, Marella Chimes, MD  Selexipag (UPTRAVI) 1200 MCG TABS Take 1,200 mcg by mouth every 12 (twelve) hours. 01/22/19  Yes Larey Dresser, MD  senna-docusate (SENOKOT-S) 8.6-50 MG tablet Take 2 tablets by mouth at bedtime. 04/23/19  Yes Emokpae, Courage, MD  torsemide (DEMADEX) 20 MG tablet 60 mg in the AM and 40 mg in the PM Patient taking differently: Take 40 mg by mouth 2 (two) times daily. 60 mg in the AM and 40 mg in the PM 04/23/19  Yes Emokpae, Courage, MD  Vitamin D, Ergocalciferol, (DRISDOL) 1.25 MG (50000 UT) CAPS capsule Take 1 capsule (50,000 Units total) by mouth every Thursday. Patient taking differently: Take 50,000 Units by mouth every 7 (seven) days.  04/30/19  Yes Roxan Hockey, MD  acetaminophen (TYLENOL) 325 MG tablet Take 2 tablets (650 mg total) by mouth every 6 (six) hours as needed for mild pain (or Fever >/= 101). 04/23/19   Roxan Hockey, MD  aspirin EC 81 MG tablet Take 1 tablet (81 mg total) by mouth daily with breakfast. 04/23/19 04/22/20  Roxan Hockey, MD    Multiple Vitamin (MULTIVITAMIN WITH MINERALS) TABS tablet Take 1 tablet by mouth daily.    [provider]  nitroGLYCERIN (NITROSTAT) 0.4 MG SL tablet Place 0.4 mg under the tongue every 5 (five) minutes x 3 doses as needed for chest pain.     [provider]  pantoprazole (PROTONIX) 20 MG tablet TAKE 1 TABLET (20 MG) BY MOUTH EACH MORNING, MAY TAKE AN ADDITIONAL DOSE IN THE EVENING IF NEEDED FOR HEARTBURN/INDIGESTION. 08/03/19   Cassandria Anger, MD  tadalafil, PAH, (ADCIRCA) 20 MG tablet Take 1 tablet (20 mg total) by mouth daily. 04/22/19   Larey Dresser, MD  temazepam (RESTORIL) 15 MG capsule Take 1 capsule (15 mg total) by mouth at bedtime as needed for sleep. 03/10/19   Fayrene Helper, MD  triamcinolone ointment (KENALOG) 0.1 % Apply 1 application topically 2 (two) times daily as needed (dry/irritated skin.). APPLY AS DIRECTED TO ITCHING SPOTS ON THE BODY DAILY TO TWICE DAILY AS NEEDED. NOT TO FACE.     [provider]  UNABLE TO FIND Overnight Pulse Oximetry (to evaluate the need for continued nocturnal oxygen)  DX I27.20 09/17/18   Fayrene Helper, MD  potassium chloride (K-DUR) 10 MEQ tablet Take 2 tablets by mouth twice daily 12/01/18 04/01/19  Fayrene Helper, MD    Physical Exam: Vitals:   08/17/19 0520 08/17/19 0527 08/17/19 0530 08/17/19 0600  BP:   (!) 117/92 106/88  Pulse:   (!) 102 95  Resp:   (!) 24 (!) 28  Temp: 99.3 F (37.4 C) 99.3 F (37.4 C)    TempSrc: Rectal Rectal    SpO2:   93% 100%  Weight:      Height:        Constitutional: NAD, calm, comfortable, obese Vitals:   08/17/19 0520 08/17/19 0527 08/17/19 0530 08/17/19 0600  BP:   (!) 117/92 106/88  Pulse:   (!) 102 95  Resp:   (!) 24 (!) 28  Temp: 99.3 F (37.4 C) 99.3 F (37.4 C)    TempSrc: Rectal  Rectal    SpO2:   93% 100%  Weight:      Height:       Eyes: lids and conjunctivae normal ENMT: Mucous membranes are moist.  Neck: normal, supple Respiratory:  clear to auscultation bilaterally. Normal respiratory effort. No accessory muscle use.  Room air. Cardiovascular: Regular rate and rhythm, no murmurs.  Bilateral extremity edema noted along with swelling to abdomen. Abdomen: no tenderness, no distention. Bowel sounds positive.  Musculoskeletal:  No joint deformity upper and lower extremities.   Skin: no rashes, lesions, ulcers.  Psychiatric: Normal judgment and insight. Alert and oriented x 3. Normal mood.   Labs on Admission: I have personally reviewed following labs and imaging studies  CBC: Recent Labs  Lab 08/17/19 0421  WBC 16.7*  NEUTROABS 11.6*  HGB 11.1*  HCT 39.2  MCV 83.4  PLT 0000000*   Basic Metabolic Panel: Recent Labs  Lab 08/17/19 0421  NA 144  K 4.5  CL 106  CO2 15*  GLUCOSE 58*  BUN 35*  CREATININE 1.79*  CALCIUM 7.7*  MG 2.9*   GFR: Estimated Creatinine Clearance: 35 mL/min (A) (by C-G formula based on SCr of 1.79 mg/dL (H)). Liver Function Tests: Recent Labs  Lab 08/17/19 0421  AST 91*  ALT 67*  ALKPHOS 82  BILITOT 1.1  PROT 7.9  ALBUMIN 3.6   Recent Labs  Lab 08/17/19 0421  LIPASE 45   No results for input(s): AMMONIA in the last 168 hours. Coagulation Profile: No results for input(s): INR, PROTIME in the last 168 hours. Cardiac Enzymes: No results for input(s): CKTOTAL, CKMB, CKMBINDEX, TROPONINI in the last 168 hours. BNP (last 3 results) No results for input(s): PROBNP in the last 8760 hours. HbA1C: No results for input(s): HGBA1C in the last 72 hours. CBG: Recent Labs  Lab 08/17/19 0546 08/17/19 0609  GLUCAP 16* 81   Lipid Profile: No results for input(s): CHOL, HDL, LDLCALC, TRIG, CHOLHDL, LDLDIRECT in the last 72 hours. Thyroid Function Tests: No results for input(s): TSH, T4TOTAL, FREET4, T3FREE, THYROIDAB in the last 72 hours. Anemia Panel: No results for input(s): VITAMINB12, FOLATE, FERRITIN, TIBC, IRON, RETICCTPCT in the last 72 hours. Urine analysis:    Component  Value Date/Time   COLORURINE YELLOW 07/30/2016 1837   APPEARANCEUR CLEAR 07/30/2016 1837   LABSPEC 1.020 07/30/2016 1837   PHURINE 7.0 07/30/2016 1837   GLUCOSEU NEGATIVE 07/30/2016 1837   HGBUR NEGATIVE 07/30/2016 1837   BILIRUBINUR NEGATIVE 07/30/2016 1837   KETONESUR NEGATIVE 07/30/2016 1837   PROTEINUR 100 (A) 07/30/2016 1837   UROBILINOGEN 0.2 10/13/2014 2352   NITRITE NEGATIVE 07/30/2016 1837   LEUKOCYTESUR NEGATIVE 07/30/2016 1837    Radiological Exams on Admission: DG Chest Port 1 View  Result Date: 08/17/2019 CLINICAL DATA:  Shortness of breath EXAM: PORTABLE CHEST 1 VIEW COMPARISON:  03/30/2019 FINDINGS: No consolidation or effusion. Stable cardiomediastinal silhouette. No pneumothorax. IMPRESSION: No active disease.  Borderline to mild cardiomegaly. Electronically Signed   By: Donavan Foil M.D.   On: 08/17/2019 03:41    EKG: Independently reviewed.  Sinus rhythm 91 bpm.  Assessment/Plan Active Problems:   Acute on chronic diastolic (congestive) heart failure (HCC)   Severe sepsis (HCC)    Increased dyspnea and weight gain in the setting of known severe pulmonary arterial hypertension with RV dysfunction -Elevated troponin likely secondary to demand ischemia with no chest pain noted -Continue outpatient PAH regimen and monitor blood pressures closely -Continue aspirin -Lasix IV 80 mg twice daily and monitor  daily weights along with intake and output -Appreciate cardiology consultation with recommendations for transfer to heart failure team once bed available  SIRS criteria with severe lactic acidosis likely related to above -Blood cultures obtained along with urine analysis which are pending -Start on vancomycin and cefepime empirically -Monitor repeat lactic acid levels as well as CBC -Chest x-ray with minimal atelectasis and no findings of pneumonia -Covid testing negative  Symptomatic hypoglycemia -Started on diet and will try to wean off D10 fluid Connective  tissue disease with scleroderma/ILD -Followed up at rheumatology outpatient  AKI on CKD stage IIIb -Likely cardiorenal syndrome which should improve with diuresis -Appears to be near baseline of 1.4-1.5 currently -Monitor with repeat renal panel and ongoing diuresis  Hypothyroidism -Continue Synthroid  Hypertension -Currently stable  Hypocalcemia with vitamin D deficiency -Managed by endocrinology outpatient -Currently asymptomatic from this  Long QT syndrome  -Continue telemetry monitoring with current bisoprolol  PFO   DVT prophylaxis: Heparin Code Status: Full Family Communication: Discussed with husband on phone Disposition Plan: Admit to [facility] progressive unit for heart failure team evaluation Consults called: Cardiology-Dr. Domenic Polite Admission status: Inpatient, progressive   Raydin Bielinski D Manuella Ghazi DO [facility]  If 7PM-7AM, please contact night-coverage [email or web address]  08/17/2019, 1:41 PM

## 2019-08-17 NOTE — ED Notes (Signed)
Date and time results received: 08/17/19 0724 (use smartphrase ".now" to insert current time)  Test: trop Critical Value: 285  Name of Provider Notified: Dr Eliane Decree  Orders Received? Or Actions Taken?: see chart

## 2019-08-17 NOTE — ED Notes (Signed)
Pt given 8oz apple juice- pt drank all of it- pt alert and oriented x4. Pt skin cool to touch. PT diaphoretic- Dr Tomi Bamberger aware- will recheck CBG in 15 minutes

## 2019-08-17 NOTE — ED Triage Notes (Signed)
[facility] - EMS states pt has been SOB all week, but husband states it has gotten worse tonight. EMS states abdomen is distended. Pt c/o N/V since yesterday. EMS states O2 was initially 95% on RA, but when they walked her to the stretcher she dropped down so they placed her on 15LPM via NRB

## 2019-08-17 NOTE — ED Notes (Signed)
Lab at bedside for blood draw.

## 2019-08-17 NOTE — Consult Note (Signed)
Cardiology Consultation:   Patient ID: MEEKA KANITZ; TO:[id number]; [birth year]   Admit date: 08/17/2019 Date of Consult: 08/17/2019  Primary Care Provider: Fayrene Helper, MD Primary Cardiologist: Loralie Champagne, MD Primary Electrophysiologist: [name], MD   Patient Profile:   Rosann Trueba Arturo is a 59 y.o. female with a history of severe pulmonary arterial hypertension associated with RV dysfunction in the setting of scleroderma and ILD, long QT syndrome on beta-blocker, and PFO who is being seen today for the evaluation of shortness of breath and abnormal cardiac enzymes at the request of Dr. Manuella Ghazi.  History of Present Illness:   Ms. Peryea presents to the ER describing worsening shortness of breath and weight gain, especially since last Monday.  She last saw Dr. Aundra Dubin in the heart failure clinic back in September, was actually scheduled for a follow-up visit today.  She tells me that she has been taking her Demadex at 40 mg in the morning and 40 mg in the evening on the days that she is at home, otherwise at work she only takes 20 mg in the morning due to inability to get to the bathroom quick enough.  She feels like her weight has gradually increased, she is up about 7 pounds in estimation.  She does not report any fevers, states that she feels very cold, has been fatigued as well.  No palpitations or syncope.  No chest pain.  She states that she has been having difficulty with a tooth, thinks that it might be infected.  Work-up in the ER finds no infiltrates by chest x-ray, she does have a leukocytosis with WBC 16.7, also elevated lactic acid of 11, flat high-sensitivity troponin I levels in the 200-300 range and a BNP of 2304.  SARS coronavirus 2 test is negative.  Blood culture sent and pending.  TSH normal.  Past Medical History:  Diagnosis Date  . Arthritis   . Asthma   . Chronic bronchitis ([place])   . Endometriosis   . Essential hypertension   . Fibroid  uterus   . GERD (gastroesophageal reflux disease)   . History of hiatal hernia   . History of nuclear stress test    a. Myoview 6/16: There is no resting or stress perfusion defect consistent with no prior infarct and no ischemia. EF 73 %  The patient was hypertensive prior and during the study. Cardiac cath 04/2016 showed normal coronary arteries  . Hx of iron deficiency anemia   . Hypersomnia with sleep apnea   . Interstitial lung disease ([place])   . Long Q-T syndrome    On beta-blocker  . Menorrhagia   . Obesity   . PFO (patent foramen ovale)   . Pneumonia   . Pulmonary arterial hypertension ([place])   . Right heart failure ([place])   . Scleroderma ([place])   . Secondary hyperthyroidism   . Tinnitus    Left  . Tubular adenoma of colon   . Type 2 diabetes mellitus ([place])   . Vertigo     Past Surgical History:  Procedure Laterality Date  . ABDOMINAL HYSTERECTOMY  2004   supracervical hysterectomy  . BREAST SURGERY  2005   reduction  . BREATH TEK H PYLORI [name] 12/24/2014   Procedure: BREATH TEK H PYLORI;  Surgeon: Greer Pickerel, MD;  Location: [facility];  Service: General;  Laterality: [name];  . CARDIAC CATHETERIZATION [name] 03/22/2016   Procedure: Right/Left Heart Cath and Coronary Angiography;  Surgeon: Troy Sine, MD;  Location:  [facility];  Service: Cardiovascular;  Laterality: [name];  . DILATION AND CURETTAGE OF UTERUS     age 11  . ESOPHAGOGASTRODUODENOSCOPY (EGD) WITH PROPOFOL [name] 10/14/2015   Procedure: ESOPHAGOGASTRODUODENOSCOPY (EGD) WITH PROPOFOL;  Surgeon: Clarene Essex, MD;  Location: [facility];  Service: Endoscopy;  Laterality: [name];  . LAPAROSCOPIC GASTRIC SLEEVE RESECTION WITH HIATAL HERNIA REPAIR [name] 07/05/2015   Procedure: LAPAROSCOPIC GASTRIC SLEEVE RESECTION WITH HIATAL HERNIA REPAIR, upper endoscopy;  Surgeon: Greer Pickerel, MD;  Location: [facility];  Service: General;  Laterality: [name];  . REDUCTION MAMMAPLASTY Bilateral   . RIGHT HEART CATH [name] 10/13/2018   Procedure:  RIGHT HEART CATH;  Surgeon: Larey Dresser, MD;  Location: [facility];  Service: Cardiovascular;  Laterality: [name];     Outpatient Medications: No current facility-administered medications on file prior to encounter.   Current Outpatient Medications on File Prior to Encounter  Medication Sig Dispense Refill  . acetaminophen (TYLENOL) 325 MG tablet Take 2 tablets (650 mg total) by mouth every 6 (six) hours as needed for mild pain (or Fever >/= 101). 12 tablet 0  . ambrisentan (LETAIRIS) 5 MG tablet Take 1 tablet (5 mg total) by mouth daily. 90 tablet 3  . aspirin EC 81 MG tablet Take 1 tablet (81 mg total) by mouth daily with breakfast. 150 tablet 2  . bisoprolol (ZEBETA) 5 MG tablet Take 0.5 tablets (2.5 mg total) by mouth daily. 45 tablet 3  . calcium carbonate (OS-CAL - DOSED IN MG OF ELEMENTAL CALCIUM) 1250 (500 Ca) MG tablet Take 3 tablets (1,500 mg of elemental calcium total) by mouth 3 (three) times daily with meals. 180 tablet 2  . levothyroxine (SYNTHROID) 75 MCG tablet TAKE 1 TABLET (75 MCG TOTAL) BY MOUTH DAILY BEFORE BREAKFAST. 90 tablet 2  . Magnesium Oxide 250 MG TABS Take 1 tablet by mouth daily with breakfast.    . Multiple Vitamin (MULTIVITAMIN WITH MINERALS) TABS tablet Take 1 tablet by mouth daily.    . mycophenolate (MYFORTIC) 360 MG TBEC EC tablet Take 720 mg by mouth 2 (two) times daily.     . nitroGLYCERIN (NITROSTAT) 0.4 MG SL tablet Place 0.4 mg under the tongue every 5 (five) minutes x 3 doses as needed for chest pain.     . pantoprazole (PROTONIX) 20 MG tablet TAKE 1 TABLET (20 MG) BY MOUTH EACH MORNING, MAY TAKE AN ADDITIONAL DOSE IN THE EVENING IF NEEDED FOR HEARTBURN/INDIGESTION. 180 tablet 1  . potassium chloride (KLOR-CON) 10 MEQ tablet Take 2 tablets (20 mEq total) by mouth every morning AND 2 tablets (20 mEq total) every evening. 300 tablet 3  . Selexipag (UPTRAVI) 1200 MCG TABS Take 1,200 mcg by mouth every 12 (twelve) hours. 60 tablet 11  .  senna-docusate (SENOKOT-S) 8.6-50 MG tablet Take 2 tablets by mouth at bedtime. 60 tablet 2  . tadalafil, PAH, (ADCIRCA) 20 MG tablet Take 1 tablet (20 mg total) by mouth daily. 30 tablet 6  . temazepam (RESTORIL) 15 MG capsule Take 1 capsule (15 mg total) by mouth at bedtime as needed for sleep. 30 capsule 4  . torsemide (DEMADEX) 20 MG tablet 60 mg in the AM and 40 mg in the PM 360 tablet 3  . triamcinolone ointment (KENALOG) 0.1 % Apply 1 application topically 2 (two) times daily as needed (dry/irritated skin.). APPLY AS DIRECTED TO ITCHING SPOTS ON THE BODY DAILY TO TWICE DAILY AS NEEDED. NOT TO FACE.     [name] UNABLE TO FIND  Overnight Pulse Oximetry (to evaluate the need for continued nocturnal oxygen)  DX I27.20 1 each 0  . Vitamin D, Ergocalciferol, (DRISDOL) 1.25 MG (50000 UT) CAPS capsule Take 1 capsule (50,000 Units total) by mouth every Thursday. 5 capsule 3  . [DISCONTINUED] potassium chloride (K-DUR) 10 MEQ tablet Take 2 tablets by mouth twice daily 390 tablet 1    Allergies:    Allergies  Allergen Reactions  . Peanut Oil Swelling  . Potassium Iodide Other (See Comments)    facial swelling, also eyes, and ears  . Ace Inhibitors Cough  . Doxycycline Hives  . Peanut-Containing Drug Products Nausea And Vomiting  . Amlodipine Rash  . Codeine Nausea And Vomiting  . Latex Hives and Rash  . Penicillins Hives, Itching and Rash    .Marland KitchenHas patient had a PCN reaction causing immediate rash, facial/tongue/throat swelling, SOB or lightheadedness with hypotension: No Has patient had a PCN reaction causing severe rash involving mucus membranes or skin necrosis: No Has patient had a PCN reaction that required hospitalization No Has patient had a PCN reaction occurring within the last 10 years: YES (3 years ago)  If all of the above answers are "NO", then may proceed with Cephalosporin use.     Social History:   Social History   Socioeconomic History  . Marital status: Married    Spouse  name: Not on file  . Number of children: Not on file  . Years of education: 74  . Highest education level: Not on file  Occupational History  . Occupation: Therapist, music: [employer]  Tobacco Use  . Smoking status: Never Smoker  . Smokeless tobacco: Never Used  Substance and Sexual Activity  . Alcohol use: No    Alcohol/week: 0.0 standard drinks  . Drug use: No  . Sexual activity: Yes    Partners: Male    Birth control/protection: Surgical    Comment: hysterectomy (supracervical)  Other Topics Concern  . Not on file  Social History Narrative   Regular exercise-no   Caffeine Use-no      [facility]: Patient lives with her husband. Works for [employer]. No bird or mold exposure. No pets currently.         Social Determinants of Health   Financial Resource Strain:   . Difficulty of Paying Living Expenses: Not on file  Food Insecurity:   . Worried About Charity fundraiser in the Last Year: Not on file  . Ran Out of Food in the Last Year: Not on file  Transportation Needs:   . Lack of Transportation (Medical): Not on file  . Lack of Transportation (Non-Medical): Not on file  Physical Activity:   . Days of Exercise per Week: Not on file  . Minutes of Exercise per Session: Not on file  Stress:   . Feeling of Stress : Not on file  Social Connections:   . Frequency of Communication with Friends and Family: Not on file  . Frequency of Social Gatherings with Friends and Family: Not on file  . Attends Religious Services: Not on file  . Active Member of Clubs or Organizations: Not on file  . Attends Archivist Meetings: Not on file  . Marital Status: Not on file  Intimate Partner Violence:   . Fear of Current or Ex-Partner: Not on file  . Emotionally Abused: Not on file  . Physically Abused: Not on file  . Sexually Abused: Not on file  Family History:   The patient's family history includes Arthritis in her  father, mother, and sister; Bleeding Disorder in her sister; Breast cancer in her sister; CAD in her mother; Cancer in her father; Colon cancer in her maternal grandfather; Depression in her father, mother, and sister; Diabetes in her father and mother; Hypertension in her father, mother, and sister; Stroke in her father and maternal aunt; Thyroid disease in her sister. There is no history of Heart attack.  ROS:  Please see the history of present illness.  All other ROS reviewed and negative.     Physical Exam/Data:   Vitals:   08/17/19 0520 08/17/19 0527 08/17/19 0530 08/17/19 0600  BP:   (!) 117/92 106/88  Pulse:   (!) 102 95  Resp:   (!) 24 (!) 28  Temp: 99.3 F (37.4 C) 99.3 F (37.4 C)    TempSrc: Rectal Rectal    SpO2:   93% 100%  Weight:      Height:        Intake/Output Summary (Last 24 hours) at 08/17/2019 1155 Last data filed at 08/17/2019 1037 Gross per 24 hour  Intake 100 ml  Output --  Net 100 ml   Filed Weights   08/17/19 0220  Weight: 89.4 kg   Body mass index is 38.47 kg/m.   Gen: Obese woman, no acute distress. HEENT: Conjunctiva and lids normal, oropharynx clear with moist mucosa. Neck: Supple, elevated JVP, no carotid bruits. Lungs: Clear to auscultation, mildly short of breath at rest. Cardiac: Regular rate and rhythm, RV S3, 2/6 systolic murmur, no pericardial rub. Abdomen: Soft, nontender, bowel sounds present. Extremities: Cool, mild lower leg edema, distal pulses 1+. Skin: Dry. Musculoskeletal: No kyphosis. Neuropsychiatric: Alert and oriented x3, affect grossly appropriate.  EKG:  An ECG dated 08/17/2019 was personally reviewed today and demonstrated:  Sinus rhythm with increased voltage, left posterior fascicular block, R prime in lead V1, inferior and anteroseptal T wave inversions that are old, prolonged QTC.  Telemetry:  I personally reviewed telemetry which shows sinus rhythm.  Relevant CV Studies:  Echocardiogram 04/22/2019:  1. The left  ventricle has normal systolic function with an ejection fraction of 60-65%. The cavity size was normal. Left ventricular diastolic Doppler parameters are consistent with pseudonormalization. No evidence of left ventricular regional wall  motion abnormalities.  2. There is mild mitral annular calcification present. No evidence of mitral valve stenosis. Trivial mitral regurgitation.  3. The aortic valve is tricuspid. Mild calcification of the aortic valve. Aortic valve regurgitation is trivial by color flow Doppler. No stenosis of the aortic valve.  4. The aortic root is normal in size and structure.  5. Trivial pericardial effusion is present.  6. Right atrial size was mildly dilated.  7. The right ventricle has moderately reduced systolic function. The cavity was severely enlarged. There is mildly increased right ventricular wall thickness. D-shaped interventricular septum suggestive of RV pressure/volume overload.  8. Normal IVC size. PA systolic pressure 99 mmHg.  Laboratory Data:  Chemistry Recent Labs  Lab 08/17/19 0421  NA 144  K 4.5  CL 106  CO2 15*  GLUCOSE 58*  BUN 35*  CREATININE 1.79*  CALCIUM 7.7*  GFRNONAA 31*  GFRAA 36*    Recent Labs  Lab 08/17/19 0421  PROT 7.9  ALBUMIN 3.6  AST 91*  ALT 67*  ALKPHOS 82  BILITOT 1.1   Hematology Recent Labs  Lab 08/17/19 0421  WBC 16.7*  RBC 4.70  HGB 11.1*  HCT  39.2  MCV 83.4  MCH 23.6*  MCHC 28.3*  RDW 16.0*  PLT 480*   Cardiac Enzymes Recent Labs  Lab 08/17/19 0421 08/17/19 0630  TROPONINIHS 238* 285*   BNP Recent Labs  Lab 08/17/19 0421  BNP 2,304.0*     Radiology/Studies:  DG Chest Port 1 View  Result Date: 08/17/2019 CLINICAL DATA:  Shortness of breath EXAM: PORTABLE CHEST 1 VIEW COMPARISON:  03/30/2019 FINDINGS: No consolidation or effusion. Stable cardiomediastinal silhouette. No pneumothorax. IMPRESSION: No active disease.  Borderline to mild cardiomegaly. Electronically Signed   By: Donavan Foil M.D.   On: 08/17/2019 03:41    Assessment and Plan:   1.  Presentation with increasing shortness of breath and weight gain in the setting of known severe pulmonary arterial hypertension with RV dysfunction, incomplete outpatient oral diuretic use, also concern for possible infection/sepsis with lactic acidosis and leukocytosis.  COVID-19 screen is negative.  Chest x-ray does not show infiltrates.  Blood culture sent and pending, she is on empiric antibiotics per primary team.  High-sensitivity troponin I trend most consistent with heart failure rather than ACS.  2.  Scleroderma with mild residual lung disease.  3.  CKD stage IIIb, creatinine 1.79 with normal potassium.  4.  History of long QT syndrome, she is on bisoprolol as an outpatient.  No recent syncope.  I informed Dr. Aundra Dubin of patient's hospitalization, she is missing clinic visit with him today.  Anticipate transfer to [facility] as bed status allows.  She will be admitted to the hospitalist team at [facility] in the interim, Dr. Manuella Ghazi aware.  In terms of cardiac medications would try and continue bisoprolol, aspirin, and outpatient PAH regimen presuming blood pressure remained stable.  Convert to IV Lasix 80 mg twice daily for now.  Signed, Rozann Lesches, MD  08/17/2019 11:55 AM

## 2019-08-17 NOTE — ED Notes (Signed)
Pt given pack of nabs- crackers/peanut butter, and ice water.

## 2019-08-17 NOTE — ED Notes (Signed)
Sabrina [name], [facility] made aware of need for D10 IV fluid

## 2019-08-17 NOTE — ED Notes (Signed)
Date and time results received: 08/17/19 5:21 AM (use smartphrase ".now" to insert current time)  Test: troponin Critical Value: 238  Name of Provider Notified: Dr Tomi Bamberger  Orders Received? Or Actions Taken?: see chart

## 2019-08-17 NOTE — ED Notes (Signed)
Date and time results received: 08/17/19 0520 (use smartphrase ".now" to insert current time)  Test:lactic acid Critical Value: 10.1  Name of Provider Notified: Dr Tomi Bamberger  Orders Received? Or Actions Taken?: see chart

## 2019-08-17 NOTE — ED Notes (Signed)
Date and time results received: 08/17/19 0709 (use smartphrase ".now" to insert current time)  Test: lac acid Critical Value: 11  Name of Provider Notified: dr Eliane Decree  Orders Received? Or Actions Taken?: see chart

## 2019-08-17 NOTE — ED Provider Notes (Addendum)
[facility] EMERGENCY DEPARTMENT Provider Note   CSN: [id number] Arrival date & time: 08/17/19  0214   Time seen 3:15 AM  History Chief Complaint  Patient presents with  . Fluid Retention    Sabrina [name] is a 59 y.o. female.  HPI   Patient has a history of congestive heart failure and sarcoidosis.  She states for the past 2 weeks despite taking her Lasix she is getting worsening dyspnea on exertion to the point now she can only walk about 5 steps.  She is unable to lay down and has to sleep in a chair now.  She has had swelling of her legs and a lot of abdominal swelling.  She denies chest pain.  She thinks maybe she had fever today because she felt hot.  She denies cough, sore throat, rhinorrhea, diarrhea.  She had a normal bowel movement this morning.  She states she had nausea today and has vomited 4 times despite not eating.  She states she weighed 197 pounds today and her dry weight is 189-190.  She has oxygen at home however she is not using it.  She states she has had problems in the past with calcium and potassium but she had it checked on Christmas Eve and they were normal.  PCP Fayrene Helper, MD Cardiology Dr Aundra Dubin  Past Medical History:  Diagnosis Date  . Allergy   . Anemia   . Arthritis   . Asthma   . Chronic bronchitis ([place])   . Connective tissue disease ([place])   . Diabetes mellitus   . Endometriosis   . Fibroid uterus   . GERD (gastroesophageal reflux disease)   . History of Doppler ultrasound    a. Carotid US 8/08:Estimated stenosis in the right and left internal carotid arteries is 0-50% and 0-50%.  . History of echocardiogram    a. Echo 6/16: EF 60-65%, no RWMA.  . History of hiatal hernia   . History of nuclear stress test    a. Myoview 6/16: There is no resting or stress perfusion defect consistent with no prior infarct and no ischemia. EF 73 %  The patient was hypertensive prior and during the study. Cardiac cath 04/2016 showed normal coronary  arteries  . Hx of iron deficiency anemia   . Hypersomnia with sleep apnea   . Hypertension   . Lupus ([place]) 07/2013   Of Skin  . Menorrhagia   . Obesity   . Pneumonia   . 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 Secondary hyperthyroidism   . Thyroid disease   . Tinnitus    left ear  . Tubular adenoma of colon   . Vertigo   . Vitamin D deficiency     Patient Active Problem List   Diagnosis Date Noted  . Acute on chronic diastolic (congestive) heart failure ([place]) 08/17/2019  . CKD (chronic kidney disease) stage 3, GFR 30-59 ml/min 08/01/2019  . Acute gastroenteritis 08/01/2019  . Hypomagnesemia 06/05/2019  . PAH (pulmonary artery hypertension) ([place]) 12/03/2018  . Hypoxia 08/19/2018  . Ischemia of digits of hand 05/17/2018  . Multinodular goiter 05/14/2018  . Diabetes mellitus without complication ([place]) [id number]  . Foreign body in finger-infected 03/19/2018  . Upper airway cough syndrome 02/04/2018  . ILD (interstitial lung disease) ([place]) 11/21/2017  . Insomnia 05/20/2017  . Morbid obesity ([place]) 05/20/2017  . Hypocalcemia 07/07/2016  . Hypokalemia 07/07/2016  . 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 Pulmonary hypertension (  [place]) 04/06/2016  . Chronic diastolic heart failure ([place]) 04/06/2016  . Precordial pain   . Drug eruption 03/14/2016  . Psoriasis 10/18/2015  . GERD (gastroesophageal reflux disease) 07/05/2015  . 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 Headache 05/10/2015  . Scleroderma ([place]) 03/09/2015  . Metabolic syndrome X 0000000  . Encounter for long-term (current) use of other medications 01/14/2015  . Seasonal allergies 01/05/2015  . Iron deficiency anemia 09/11/2014  . Tubular adenoma of colon 09/11/2014  . Neoplasm of uncertain behavior of skin 05/09/2012  . Chronic fatigue 10/26/2010  . Secondary hyperparathyroidism ([place]) 12/02/2008  . Vitamin D deficiency 10/10/2008  .  Hypothyroidism 08/27/2007  . Essential hypertension 08/27/2007    Past Surgical History:  Procedure Laterality Date  . ABDOMINAL HYSTERECTOMY  2004   supracervical hysterectomy  . BREAST SURGERY  2005   reduction  . BREATH TEK H PYLORI [name] 12/24/2014   Procedure: BREATH TEK H PYLORI;  Surgeon: Greer Pickerel, MD;  Location: [facility];  Service: General;  Laterality: [name];  . CARDIAC CATHETERIZATION [name] 03/22/2016   Procedure: Right/Left Heart Cath and Coronary Angiography;  Surgeon: Troy Sine, MD;  Location: [facility];  Service: Cardiovascular;  Laterality: [name];  . DILATION AND CURETTAGE OF UTERUS     age 89  . ESOPHAGOGASTRODUODENOSCOPY (EGD) WITH PROPOFOL [name] 10/14/2015   Procedure: ESOPHAGOGASTRODUODENOSCOPY (EGD) WITH PROPOFOL;  Surgeon: Clarene Essex, MD;  Location: [facility];  Service: Endoscopy;  Laterality: [name];  . LAPAROSCOPIC GASTRIC SLEEVE RESECTION WITH HIATAL HERNIA REPAIR [name] 07/05/2015   Procedure: LAPAROSCOPIC GASTRIC SLEEVE RESECTION WITH HIATAL HERNIA REPAIR, upper endoscopy;  Surgeon: Greer Pickerel, MD;  Location: [facility];  Service: General;  Laterality: [name];  . REDUCTION MAMMAPLASTY Bilateral   . RIGHT HEART CATH [name] 10/13/2018   Procedure: RIGHT HEART CATH;  Surgeon: Larey Dresser, MD;  Location: [facility];  Service: Cardiovascular;  Laterality: [name];     OB History    Gravida  0   Para  0   Term  0   Preterm  0   AB  0   Living  0     SAB  0   TAB  0   Ectopic  0   Multiple  0   Live Births              Family History  Problem Relation Age of Onset  . Hypertension Mother   . Diabetes Mother   . Arthritis Mother   . Depression Mother   . CAD Mother        s/p PCI  . Diabetes Father   . Stroke Father   . Arthritis Father   . Depression Father   . Hypertension Father   . Cancer Father        prostate  . Hypertension Sister        x 2  . Breast cancer Sister   . Arthritis Sister   . Bleeding Disorder Sister   .  Depression Sister   . Thyroid disease Sister   . Colon cancer Maternal Grandfather   . Stroke Maternal Aunt   . Heart attack Neg Hx     Social History   Tobacco Use  . Smoking status: Never Smoker  . Smokeless tobacco: Never Used  Substance Use Topics  . Alcohol use: No    Alcohol/week: 0.0 standard drinks  . Drug use: No  lives at home Lives with spouse  Home Medications Prior to  Admission medications   Medication Sig Start Date End Date Taking? Authorizing Provider  acetaminophen (TYLENOL) 325 MG tablet Take 2 tablets (650 mg total) by mouth every 6 (six) hours as needed for mild pain (or Fever >/= 101). 04/23/19   Roxan Hockey, MD  ambrisentan (LETAIRIS) 5 MG tablet Take 1 tablet (5 mg total) by mouth daily. 12/08/18   Larey Dresser, MD  aspirin EC 81 MG tablet Take 1 tablet (81 mg total) by mouth daily with breakfast. 04/23/19 04/22/20  Roxan Hockey, MD  bisoprolol (ZEBETA) 5 MG tablet Take 0.5 tablets (2.5 mg total) by mouth daily. 03/12/19   Larey Dresser, MD  calcium carbonate (OS-CAL - DOSED IN MG OF ELEMENTAL CALCIUM) 1250 (500 Ca) MG tablet Take 3 tablets (1,500 mg of elemental calcium total) by mouth 3 (three) times daily with meals. 06/05/19   Cassandria Anger, MD  levothyroxine (SYNTHROID) 75 MCG tablet TAKE 1 TABLET (75 MCG TOTAL) BY MOUTH DAILY BEFORE BREAKFAST. 03/03/19   Cassandria Anger, MD  Magnesium Oxide 250 MG TABS Take 1 tablet by mouth daily with breakfast.    [provider]  Multiple Vitamin (MULTIVITAMIN WITH MINERALS) TABS tablet Take 1 tablet by mouth daily.    [provider]  mycophenolate (MYFORTIC) 360 MG TBEC EC tablet Take 720 mg by mouth 2 (two) times daily.  04/04/18   [provider]  nitroGLYCERIN (NITROSTAT) 0.4 MG SL tablet Place 0.4 mg under the tongue every 5 (five) minutes x 3 doses as needed for chest pain.     [provider]  pantoprazole (PROTONIX) 20 MG tablet TAKE 1 TABLET (20 MG) BY  MOUTH EACH MORNING, MAY TAKE AN ADDITIONAL DOSE IN THE EVENING IF NEEDED FOR HEARTBURN/INDIGESTION. 08/03/19   Cassandria Anger, MD  potassium chloride (KLOR-CON) 10 MEQ tablet Take 2 tablets (20 mEq total) by mouth every morning AND 2 tablets (20 mEq total) every evening. 06/22/19   Cassandria Anger, MD  Selexipag (UPTRAVI) 1200 MCG TABS Take 1,200 mcg by mouth every 12 (twelve) hours. 01/22/19   Larey Dresser, MD  senna-docusate (SENOKOT-S) 8.6-50 MG tablet Take 2 tablets by mouth at bedtime. 04/23/19   Roxan Hockey, MD  tadalafil, PAH, (ADCIRCA) 20 MG tablet Take 1 tablet (20 mg total) by mouth daily. 04/22/19   Larey Dresser, MD  temazepam (RESTORIL) 15 MG capsule Take 1 capsule (15 mg total) by mouth at bedtime as needed for sleep. 03/10/19   Fayrene Helper, MD  torsemide (DEMADEX) 20 MG tablet 60 mg in the AM and 40 mg in the PM 04/23/19   Emokpae, Courage, MD  triamcinolone ointment (KENALOG) 0.1 % Apply 1 application topically 2 (two) times daily as needed (dry/irritated skin.). APPLY AS DIRECTED TO ITCHING SPOTS ON THE BODY DAILY TO TWICE DAILY AS NEEDED. NOT TO FACE.     [provider]  UNABLE TO FIND Overnight Pulse Oximetry (to evaluate the need for continued nocturnal oxygen)  DX I27.20 09/17/18   Fayrene Helper, MD  Vitamin D, Ergocalciferol, (DRISDOL) 1.25 MG (50000 UT) CAPS capsule Take 1 capsule (50,000 Units total) by mouth every Thursday. 04/30/19   Roxan Hockey, MD  potassium chloride (K-DUR) 10 MEQ tablet Take 2 tablets by mouth twice daily 12/01/18 04/01/19  Fayrene Helper, MD    Allergies    Peanut oil, Potassium iodide, Ace inhibitors, Doxycycline, Peanut-containing drug products, Amlodipine, Codeine, Latex, and Penicillins  Review of Systems   Review of  Systems  All other systems reviewed and are negative.   Physical Exam Updated Vital Signs BP 106/88   Pulse 95   Temp 99.3 F (37.4 C) (Rectal)   Resp (!) 28   Ht 5' (1.524 m)    Wt 89.4 kg   LMP 10/31/2002   SpO2 100%   BMI 38.47 kg/m   Physical Exam Vitals and nursing note reviewed.  Constitutional:      Appearance: Normal appearance. She is obese.  HENT:     Head: Normocephalic and atraumatic.     Ears:     Comments: Patient's ears have some thickening of the skin and some discoloration consistent with her history of sarcoidosis.  Her face is noted to be very smooth and without wrinkles.    Nose: Nose normal.     Mouth/Throat:     Mouth: Mucous membranes are moist.     Pharynx: Oropharynx is clear.  Eyes:     Extraocular Movements: Extraocular movements intact.     Conjunctiva/sclera: Conjunctivae normal.     Pupils: Pupils are equal, round, and reactive to light.  Cardiovascular:     Rate and Rhythm: Normal rate and regular rhythm.  Pulmonary:     Effort: Pulmonary effort is normal. Tachypnea present. No respiratory distress.     Breath sounds: Decreased air movement present. No wheezing or rales.  Abdominal:     General: There is distension.     Palpations: Abdomen is soft.  Musculoskeletal:     Cervical back: Normal range of motion.     Comments: Patient skin on her lower extremities is thickened English some areas of hyperpigmentation scattered with hypopigmentation, I am unable to appreciate pitting edema.  Skin:    Comments: Patient has some thickening of the skin and smoothness of the skin with areas of hypopigmentation and hyperpigmentation especially prominent on her chest.  Neurological:     General: No focal deficit present.     Mental Status: She is alert and oriented to person, place, and time.     Cranial Nerves: No cranial nerve deficit.  Psychiatric:        Mood and Affect: Mood normal.        Behavior: Behavior normal.        Thought Content: Thought content normal.     ED Results / Procedures / Treatments   Labs (all labs ordered are listed, but only abnormal results are displayed)  Results for orders placed or  performed during the hospital encounter of 08/17/19  Comprehensive metabolic panel  Result Value Ref Range   Sodium 144 135 - 145 mmol/L   Potassium 4.5 3.5 - 5.1 mmol/L   Chloride 106 98 - 111 mmol/L   CO2 15 (L) 22 - 32 mmol/L   Glucose, Bld 58 (L) 70 - 99 mg/dL   BUN 35 (H) 6 - 20 mg/dL   Creatinine, Ser 1.79 (H) 0.44 - 1.00 mg/dL   Calcium 7.7 (L) 8.9 - 10.3 mg/dL   Total Protein 7.9 6.5 - 8.1 g/dL   Albumin 3.6 3.5 - 5.0 g/dL   AST 91 (H) 15 - 41 U/L   ALT 67 (H) 0 - 44 U/L   Alkaline Phosphatase 82 38 - 126 U/L   Total Bilirubin 1.1 0.3 - 1.2 mg/dL   GFR calc non Af Amer 31 (L) >60 mL/min   GFR calc Af Amer 36 (L) >60 mL/min  CBC with Differential  Result Value Ref Range   WBC 16.7 (H)  4.0 - 10.5 K/uL   RBC 4.70 3.87 - 5.11 MIL/uL   Hemoglobin 11.1 (L) 12.0 - 15.0 g/dL   HCT 39.2 36.0 - 46.0 %   MCV 83.4 80.0 - 100.0 fL   MCH 23.6 (L) 26.0 - 34.0 pg   MCHC 28.3 (L) 30.0 - 36.0 g/dL   RDW 16.0 (H) 11.5 - 15.5 %   Platelets 480 (H) 150 - 400 K/uL   nRBC 0.1 0.0 - 0.2 %   Neutrophils Relative % 70 %   Neutro Abs 11.6 (H) 1.7 - 7.7 K/uL   Lymphocytes Relative 22 %   Lymphs Abs 3.6 0.7 - 4.0 K/uL   Monocytes Relative 7 %   Monocytes Absolute 1.2 (H) 0.1 - 1.0 K/uL   Eosinophils Relative 0 %   Eosinophils Absolute 0.0 0.0 - 0.5 K/uL   Basophils Relative 0 %   Basophils Absolute 0.0 0.0 - 0.1 K/uL   Immature Granulocytes 1 %   Abs Immature Granulocytes 0.23 (H) 0.00 - 0.07 K/uL  Brain natriuretic peptide  Result Value Ref Range   B Natriuretic Peptide 2,304.0 (H) 0.0 - 100.0 pg/mL  Lactic acid, plasma  Result Value Ref Range   Lactic Acid, Venous 10.1 (HH) 0.5 - 1.9 mmol/L  Lactic acid, plasma  Result Value Ref Range   Lactic Acid, Venous 11.0 (HH) 0.5 - 1.9 mmol/L  Lipase, blood  Result Value Ref Range   Lipase 45 11 - 51 U/L  Magnesium  Result Value Ref Range   Magnesium 2.9 (H) 1.7 - 2.4 mg/dL  CBG monitoring, ED  Result Value Ref Range    Glucose-Capillary 16 (LL) 70 - 99 mg/dL  CBG monitoring, ED  Result Value Ref Range   Glucose-Capillary 81 70 - 99 mg/dL  Troponin I (High Sensitivity)  Result Value Ref Range   Troponin I (High Sensitivity) 238 (HH) <18 ng/L     Laboratory interpretation all normal except hyperglycemia, hypomagnesemia positive initial troponin very elevated BNP, leukocytosis  EKG EKG Interpretation  Date/Time:  Monday August 17 2019 03:48:21 EST Ventricular Rate:  91 PR Interval:    QRS Duration: 164 QT Interval:  413 QTC Calculation: 509 R Axis:   113 Text Interpretation: Sinus rhythm Probable left atrial enlargement RBBB and LPFB Baseline wander in lead(s) II aVR aVF No significant change since last tracing 23 Apr 2019 Confirmed by Rolland Porter [phone]) on 08/17/2019 3:50:37 AM    Radiology DG Chest Port 1 View  Result Date: 08/17/2019 CLINICAL DATA:  Shortness of breath EXAM: PORTABLE CHEST 1 VIEW COMPARISON:  03/30/2019 FINDINGS: No consolidation or effusion. Stable cardiomediastinal silhouette. No pneumothorax. IMPRESSION: No active disease.  Borderline to mild cardiomegaly. Electronically Signed   By: Donavan Foil M.D.   On: 08/17/2019 03:41    Procedures .Critical Care Performed by: Rolland Porter, MD Authorized by: Rolland Porter, MD   Critical care provider statement:    Critical care time (minutes):  38   Critical care was necessary to treat or prevent imminent or life-threatening deterioration of the following conditions:  Endocrine crisis, cardiac failure and dehydration   Critical care was time spent personally by me on the following activities:  Discussions with consultants, examination of patient, obtaining history from patient or surrogate, ordering and review of laboratory studies, ordering and review of radiographic studies, pulse oximetry, re-evaluation of patient's condition and review of old charts   (including critical care time)  Medications Ordered in ED Medications    dextrose 10 % infusion (  Intravenous New Bag/Given 08/17/19 0719)  furosemide (LASIX) injection 80 mg (80 mg Intravenous Given 08/17/19 0352)  dextrose 50 % solution 25 g (50 mLs Intravenous Given 08/17/19 0551)    ED Course  I have reviewed the triage vital signs and the nursing notes.  Pertinent labs & imaging results that were available during my care of the patient were reviewed by me and considered in my medical decision making (see chart for details).    MDM Rules/Calculators/A&P                       Patient was given IV Lasix, she has renal insufficiency she was given 80 mg IV.  Patient told nursing staff she had not been compliant with taking her diuretics.  Nurse tech reports about 5:45 AM she went into check patient and patient was diaphoretic and cold to touch, she did a CBG which was 16.  Patient was given 1 amp of D50.  Patient was started on D10 at 5 cc/h.  Recheck at 6:15 AM patient states she is feeling better than when she first came in.  The dilemma with this patient is that she has had vomiting and appears to be dehydrated however she complains of abdominal swelling and extremity swelling and shortness of breath to suggest her congestive heart failure is flared up.  She also reports a 7 pound weight gain.  7:15 AM Dr. Manuella Ghazi, hospitalist will admit.  Final Clinical Impression(s) / ED Diagnoses Final diagnoses:  Lactic acidosis  Non-intractable vomiting with nausea, unspecified vomiting type  Hypoglycemia  Acute on chronic congestive heart failure, unspecified heart failure type [facility])    Rx / DC Orders  Plan admission  Rolland Porter, MD, Barbette Or, MD 08/17/19 [place], [place], MD 08/17/19 [phone]

## 2019-08-18 ENCOUNTER — Inpatient Hospital Stay (HOSPITAL_COMMUNITY): Payer: 59

## 2019-08-18 DIAGNOSIS — A419 Sepsis, unspecified organism: Secondary | ICD-10-CM

## 2019-08-18 DIAGNOSIS — I272 Pulmonary hypertension, unspecified: Secondary | ICD-10-CM

## 2019-08-18 DIAGNOSIS — R6520 Severe sepsis without septic shock: Secondary | ICD-10-CM

## 2019-08-18 DIAGNOSIS — R0600 Dyspnea, unspecified: Secondary | ICD-10-CM

## 2019-08-18 DIAGNOSIS — I5033 Acute on chronic diastolic (congestive) heart failure: Secondary | ICD-10-CM

## 2019-08-18 LAB — GLUCOSE, CAPILLARY
Glucose-Capillary: 59 mg/dL — ABNORMAL LOW (ref 70–99)
Glucose-Capillary: 102 mg/dL — ABNORMAL HIGH (ref 70–99)
Glucose-Capillary: 94 mg/dL (ref 70–99)
Glucose-Capillary: 122 mg/dL — ABNORMAL HIGH (ref 70–99)

## 2019-08-18 LAB — BASIC METABOLIC PANEL
Anion gap: 16 — ABNORMAL HIGH (ref 5–15)
BUN: 42 mg/dL — ABNORMAL HIGH (ref 6–20)
CO2: 25 mmol/L (ref 22–32)
Calcium: 7.3 mg/dL — ABNORMAL LOW (ref 8.9–10.3)
Chloride: 97 mmol/L — ABNORMAL LOW (ref 98–111)
Creatinine, Ser: 2.12 mg/dL — ABNORMAL HIGH (ref 0.44–1.00)
GFR calc Af Amer: 29 mL/min — ABNORMAL LOW (ref >60)
GFR calc non Af Amer: 25 mL/min — ABNORMAL LOW (ref >60)
Glucose, Bld: 98 mg/dL (ref 70–99)
Potassium: 3.6 mmol/L (ref 3.5–5.1)
Sodium: 138 mmol/L (ref 135–145)

## 2019-08-18 LAB — CBG MONITORING, ED
Glucose-Capillary: 119 mg/dL — ABNORMAL HIGH (ref 70–99)
Glucose-Capillary: 60 mg/dL — ABNORMAL LOW (ref 70–99)
Glucose-Capillary: 70 mg/dL (ref 70–99)

## 2019-08-18 LAB — CBC
HCT: 33.9 % — ABNORMAL LOW (ref 36.0–46.0)
Hemoglobin: 10.3 g/dL — ABNORMAL LOW (ref 12.0–15.0)
MCH: 23.6 pg — ABNORMAL LOW (ref 26.0–34.0)
MCHC: 30.4 g/dL (ref 30.0–36.0)
MCV: 77.8 fL — ABNORMAL LOW (ref 80.0–100.0)
Platelets: 357 10*3/uL (ref 150–400)
RBC: 4.36 MIL/uL (ref 3.87–5.11)
RDW: 15.9 % — ABNORMAL HIGH (ref 11.5–15.5)
WBC: 15.5 10*3/uL — ABNORMAL HIGH (ref 4.0–10.5)
nRBC: 0.2 % (ref 0.0–0.2)

## 2019-08-18 LAB — LACTIC ACID, PLASMA: Lactic Acid, Venous: 2.2 mmol/L (ref 0.5–1.9)

## 2019-08-18 LAB — PROCALCITONIN: Procalcitonin: 8.99 ng/mL

## 2019-08-18 MED ORDER — MIDODRINE HCL 5 MG PO TABS
10.0000 mg | ORAL_TABLET | Freq: Three times a day (TID) | ORAL | Status: DC
  Administered 2019-08-18 – 2019-08-20 (×3): 10 mg via ORAL
  Filled 2019-08-18 (×3): qty 2

## 2019-08-18 MED ORDER — SODIUM CHLORIDE 0.9% FLUSH
3.0000 mL | Freq: Two times a day (BID) | INTRAVENOUS | Status: DC
  Administered 2019-08-18: 3 mL via INTRAVENOUS

## 2019-08-18 MED ORDER — SIMETHICONE 80 MG PO CHEW
80.0000 mg | CHEWABLE_TABLET | Freq: Once | ORAL | Status: AC
  Administered 2019-08-18: 80 mg via ORAL
  Filled 2019-08-18: qty 1

## 2019-08-18 MED ORDER — MIDODRINE HCL 5 MG PO TABS
5.0000 mg | ORAL_TABLET | Freq: Once | ORAL | Status: AC
  Administered 2019-08-18: 5 mg via ORAL
  Filled 2019-08-18: qty 1

## 2019-08-18 MED ORDER — SODIUM CHLORIDE 0.9 % IV BOLUS
250.0000 mL | Freq: Once | INTRAVENOUS | Status: AC
  Administered 2019-08-18: 250 mL via INTRAVENOUS

## 2019-08-18 MED ORDER — POTASSIUM CHLORIDE CRYS ER 20 MEQ PO TBCR
20.0000 meq | EXTENDED_RELEASE_TABLET | Freq: Once | ORAL | Status: AC
  Administered 2019-08-18: 20 meq via ORAL
  Filled 2019-08-18: qty 1

## 2019-08-18 MED ORDER — MIDODRINE HCL 5 MG PO TABS
5.0000 mg | ORAL_TABLET | Freq: Three times a day (TID) | ORAL | Status: DC
  Administered 2019-08-18 (×2): 5 mg via ORAL
  Filled 2019-08-18 (×2): qty 1

## 2019-08-18 NOTE — Progress Notes (Signed)
RN [place] paged NP regarding hypotension 77/63.  Paged that manual BP was 84/54 right arm and 80/54 left arm.  No further messages or vitals entered in chart prior to 7a end of shift.

## 2019-08-18 NOTE — ED Notes (Signed)
Report given to [place] RN on 2W

## 2019-08-18 NOTE — Significant Event (Signed)
Rapid Response Event Note  Overview: Hypotension   Initial Focused Assessment: I came to see the patient at the request of the nurse. Per nurse, patient has had soft BPs, she feels like the patient seems more tired and fatigued. Patient was awake when I walked in, conversant, we laughed and joked, but she did endorse that she felt dizzy and lightheaded. Patient stated she feels weaker now. Endorses mild headache, she states her breathing is better - denies chest pain/shortness of breath. HR 60s, 93% on 2L [place], respiratory effort is normal, rate is normal - lung sounds are clear. Patient endorses that edema in BUE/BLE is better - she not as swollen as before. Automatic BPs were in the SBP 60s - so I checked a manual BP - 78/50. HF APP at bedside.  Interventions: -- NS 250cc bolus x 1  -- Midodrine 5 mg PO x 1  -- Lasix stopped   Plan of Care: -- Monitor BP - check Manual BPs - I left the manual set at the bedside  -- I came by again around 1730 - patient felt better - her headache was improved - still hurt but it was better and she wasn't as dizzy as she was earlier.  -- RHC tomorrow AM   Event Summary:  Start Time 1525  End Time 1555   Yilia Sacca R

## 2019-08-18 NOTE — Progress Notes (Signed)
See consult note in computer from 1/4.  Patient seen today (1/5) after transfer, note started yesterday (1/4) in anticipation of transfer.   Sabrina [name] 08/18/2019

## 2019-08-18 NOTE — Progress Notes (Cosign Needed)
Called nursing staff for ongoing hypotension.  Earlier today she received 80 mg IV lasix. 600 cc urine output noted.   Manual BP checked by Rapid Response 78/50.O2 saturations 97% on 2liters [place].   She does not appear to be in distress.. Complaining of headache. Denies shortness of breath.  Lungs clear. NSR 60s   Stop IV lasix. Give 250 cc NS bolus now. -Give extra 5 mg midodrine now. - Hold adcirca.  RHC in am.  Ismeal Heider  NP_C  4:01 PM

## 2019-08-18 NOTE — Progress Notes (Signed)
Spoke with NP  Amy Clegg of Heart Failure team to ask about order of Lasix. Voiced concern about consistent low BP for patient and continuing with administering IV Lasix 80 mg. Told to hold Lasix only when systolic is below 80. Will continue to monitor and assess patient.

## 2019-08-18 NOTE — ED Notes (Signed)
Report given to Tiffany with [facility] 

## 2019-08-18 NOTE — Progress Notes (Signed)
Patient ID: Sabrina [name], female   DOB: 1961/06/22, 59 y.o.   MRN: [id number]  PROGRESS NOTE    BETHANY PHILLIPP  [id number] DOB: 31-Jul-1961 DOA: 08/17/2019 PCP: Fayrene Helper, MD   Brief Narrative:  59 year old female with history of severe pulmonary hypertension with RV dysfunction, interstitial lung disease, hypothyroidism, connective tissue disease with scleroderma/Raynaud's, PFO, long QT syndrome, CKD stage IIIb, hypertension, chronic hypocalcemia and vitamin D deficiency presented with worsening dyspnea on exertion with lower extremity swelling.  She was admitted to [facility] and started on intravenous Lasix.  Cardiology evaluated the patient and recommended transfer to [place]:   Worsening dyspnea and weight gain in the setting of severe pulmonary hypertension with RV dysfunction/acute on chronic cor pulmonale Elevated troponin: Most likely secondary to demand ischemia; no chest pain noted -Patient was started on intravenous Lasix and transferred to [facility] per cardiology recommendations.  Heart failure team has been notified.  Will follow further recommendations.  Strict and output/daily weight/fluid restriction. -Continue ambrisentan, selexipag and tadalafil  SIRS with severe lactic acidosis: Most likely secondary to above -Doubt that patient has any evidence of sepsis -Presented with lactic acid of 11 which is down to 2.2 today.  Patient has been empirically started on broad-spectrum antibiotics.  Follow cultures.  Follow procalcitonin and CT of the chest. -Influenza and COVID-19 testing negative.  Chest x-ray was negative for any acute infiltrates.  Leukocytosis -Probably from above.  Improving slightly.  Monitor  Hypotension in a patient with hypertension -Most likely from CHF exacerbation.  Hold diuretics if SBP less than 80.  Cannot give beta-blocker because of hypotension.    Acute kidney injury on chronic  renal disease stage IIIb -Likely from cardiorenal syndrome -Baseline creatinine around 1.4-1.5.  Creatinine 2.12 today.  Monitor.  Hypoglycemia -Questionable cause.  Currently on D10.  Blood sugars improved, will DC IV fluids.  Anemia of chronic disease -From chronic kidney disease.  No signs of bleeding.  Monitor  Hypothyroidism -Continue Synthroid  Hypocalcemia with vitamin D deficiency -Managed by endocrinology as an outpatient.  Continue calcium supplementation  Long QT syndrome -Continue telemetry monitoring  Scleroderma -Outpatient follow-up with [facility] rheumatology    DVT prophylaxis: Heparin Code Status: Full Family Communication: Spoke to patient at bedside Disposition Plan: Depends on clinical outcome  Consultants: Cardiology/heart failure Procedures: None  Antimicrobials:  Cefepime and vancomycin from 1 424 hours  Subjective: Patient seen and examined at bedside.  She denies any current chest pain or worsening shortness breath, fever.  Feels slightly better breathing wise.  Objective: Vitals:   08/18/19 0804 08/18/19 0902 08/18/19 0932 08/18/19 0947  BP: (!) 83/56 (!) 79/57 91/79 (!) 86/61  Pulse: 71 72 73 74  Resp: 17 20 19 20   Temp: 98.1 F (36.7 C)     TempSrc: Oral     SpO2: 97% 95% 99% 93%  Weight:      Height:        Intake/Output Summary (Last 24 hours) at 08/18/2019 1018 Last data filed at 08/18/2019 0035 Gross per 24 hour  Intake 1146.93 ml  Output --  Net 1146.93 ml   Filed Weights   08/17/19 0220  Weight: 89.4 kg    Examination:  General exam: Appears calm and comfortable.  Looks chronically ill and older than stated age. Respiratory system: Bilateral decreased breath sounds at bases with scattered crackles Cardiovascular system: S1 & S2 heard, Rate controlled Gastrointestinal system: Abdomen is nondistended, soft and  nontender. Normal bowel sounds heard. Extremities: No cyanosis, clubbing; bilateral lower extremity edema  present Central nervous system: Alert and oriented. No focal neurological deficits. Moving extremities Skin: No rashes, lesions or ulcers Psychiatry: Flat affect    Data Reviewed: I have personally reviewed following labs and imaging studies  CBC: Recent Labs  Lab 08/17/19 0421 08/18/19 0754  WBC 16.7* 15.5*  NEUTROABS 11.6*  --   HGB 11.1* 10.3*  HCT 39.2 33.9*  MCV 83.4 77.8*  PLT 480* [id number]   Basic Metabolic Panel: Recent Labs  Lab 08/17/19 0421 08/18/19 0754  NA 144 138  K 4.5 3.6  CL 106 97*  CO2 15* 25  GLUCOSE 58* 98  BUN 35* 42*  CREATININE 1.79* 2.12*  CALCIUM 7.7* 7.3*  MG 2.9*  --    GFR: Estimated Creatinine Clearance: 29.5 mL/min (A) (by C-G formula based on SCr of 2.12 mg/dL (H)). Liver Function Tests: Recent Labs  Lab 08/17/19 0421  AST 91*  ALT 67*  ALKPHOS 82  BILITOT 1.1  PROT 7.9  ALBUMIN 3.6   Recent Labs  Lab 08/17/19 0421  LIPASE 45   No results for input(s): AMMONIA in the last 168 hours. Coagulation Profile: No results for input(s): INR, PROTIME in the last 168 hours. Cardiac Enzymes: No results for input(s): CKTOTAL, CKMB, CKMBINDEX, TROPONINI in the last 168 hours. BNP (last 3 results) No results for input(s): PROBNP in the last 8760 hours. HbA1C: No results for input(s): HGBA1C in the last 72 hours. CBG: Recent Labs  Lab 08/18/19 0159 08/18/19 0214 08/18/19 0307 08/18/19 0336 08/18/19 0622  GLUCAP 70 60* 59* 102* 94   Lipid Profile: No results for input(s): CHOL, HDL, LDLCALC, TRIG, CHOLHDL, LDLDIRECT in the last 72 hours. Thyroid Function Tests: No results for input(s): TSH, T4TOTAL, FREET4, T3FREE, THYROIDAB in the last 72 hours. Anemia Panel: No results for input(s): VITAMINB12, FOLATE, FERRITIN, TIBC, IRON, RETICCTPCT in the last 72 hours. Sepsis Labs: Recent Labs  Lab 08/17/19 0421 08/17/19 0630 08/18/19 0754  LATICACIDVEN 10.1* 11.0* 2.2*    Recent Results (from the past 240 hour(s))  Culture,  blood (routine x 2)     Status: None (Preliminary result)   Collection Time: 08/17/19  9:47 AM   Specimen: BLOOD LEFT HAND  Result Value Ref Range Status   Specimen Description   Final    BLOOD LEFT HAND BOTTLES DRAWN AEROBIC AND ANAEROBIC   Special Requests   Final    Blood Culture results may not be optimal due to an inadequate volume of blood received in culture bottles   Culture   Final    NO GROWTH < 24 HOURS Performed at [facility], [address], [place], [place] [address]    Report Status PENDING  Incomplete  Culture, blood (routine x 2)     Status: None (Preliminary result)   Collection Time: 08/17/19  9:48 AM   Specimen: BLOOD LEFT HAND  Result Value Ref Range Status   Specimen Description BLOOD LEFT HAND BOTTLES DRAWN AEROBIC ONLY  Final   Special Requests   Final    Blood Culture results may not be optimal due to an inadequate volume of blood received in culture bottles   Culture   Final    NO GROWTH < 24 HOURS Performed at [facility], [address] [place], [place] [address]    Report Status PENDING  Incomplete  Respiratory Panel by RT PCR (Flu A&B, Covid) - Nasopharyngeal Swab     Status:  None   Collection Time: 08/17/19 10:02 AM   Specimen: Nasopharyngeal Swab  Result Value Ref Range Status   SARS Coronavirus 2 by RT PCR NEGATIVE NEGATIVE Final    Comment: (NOTE) SARS-CoV-2 target nucleic acids are NOT DETECTED. The SARS-CoV-2 RNA is generally detectable in upper respiratoy specimens during the acute phase of infection. The lowest concentration of SARS-CoV-2 viral copies this assay can detect is 131 copies/mL. A negative result does not preclude SARS-Cov-2 infection and should not be used as the sole basis for treatment or other patient management decisions. A negative result may occur with  improper specimen collection/handling, submission of specimen other than nasopharyngeal swab, presence of viral mutation(s) within the areas targeted by this assay, and  inadequate number of viral copies (<131 copies/mL). A negative result must be combined with clinical observations, patient history, and epidemiological information. The expected result is Negative. Fact Sheet for Patients:  PinkCheek.be Fact Sheet for Healthcare Providers:  GravelBags.it This test is not yet ap proved or cleared by the [place] FDA and  has been authorized for detection and/or diagnosis of SARS-CoV-2 by FDA under an Emergency Use Authorization (EUA). This EUA will remain  in effect (meaning this test can be used) for the duration of the COVID-19 declaration under Section 564(b)(1) of the Act, 21 U.S.C. section 360bbb-3(b)(1), unless the authorization is terminated or revoked sooner.    Influenza A by PCR NEGATIVE NEGATIVE Final   Influenza B by PCR NEGATIVE NEGATIVE Final    Comment: (NOTE) The Xpert Xpress SARS-CoV-2/FLU/RSV assay is intended as an aid in  the diagnosis of influenza from Nasopharyngeal swab specimens and  should not be used as a sole basis for treatment. Nasal washings and  aspirates are unacceptable for Xpert Xpress SARS-CoV-2/FLU/RSV  testing. Fact Sheet for Patients: PinkCheek.be Fact Sheet for Healthcare Providers: GravelBags.it This test is not yet approved or cleared by the [place] FDA and  has been authorized for detection and/or diagnosis of SARS-CoV-2 by  FDA under an Emergency Use Authorization (EUA). This EUA will remain  in effect (meaning this test can be used) for the duration of the  Covid-19 declaration under Section 564(b)(1) of the Act, 21  U.S.C. section 360bbb-3(b)(1), unless the authorization is  terminated or revoked. Performed at [facility], [address] [place], [place] [address]          Radiology Studies: [facility] Chest [facility] 1 View  Result Date: 08/17/2019 CLINICAL DATA:  Shortness of breath  EXAM: PORTABLE CHEST 1 VIEW COMPARISON:  03/30/2019 FINDINGS: No consolidation or effusion. Stable cardiomediastinal silhouette. No pneumothorax. IMPRESSION: No active disease.  Borderline to mild cardiomegaly. Electronically Signed   By: Donavan Foil M.D.   On: 08/17/2019 03:41        Scheduled Meds: . ambrisentan  5 mg Oral Daily  . aspirin EC  81 mg Oral Q breakfast  . calcium carbonate  3 tablet Oral TID WC  . furosemide  80 mg Intravenous BID  . heparin  5,000 Units Subcutaneous Q8H  . levothyroxine  75 mcg Oral QAC breakfast  . magnesium oxide  200 mg Oral Q breakfast  . multivitamin with minerals  1 tablet Oral Daily  . mycophenolate  720 mg Oral BID  . pantoprazole  20 mg Oral Daily  . Selexipag  1,200 mcg Oral Q12H  . senna-docusate  2 tablet Oral QHS  . sodium chloride flush  3 mL Intravenous Q12H  . tadalafil (PAH)  20 mg Oral Daily  Continuous Infusions: . sodium chloride    . ceFEPime (MAXIPIME) IV Stopped (08/18/19 0035)  . dextrose 30 mL/hr at 08/17/19 1456  . vancomycin            Aline August, MD [facility] 08/18/2019, 10:18 AM

## 2019-08-19 ENCOUNTER — Encounter (HOSPITAL_COMMUNITY)
Admission: EM | Disposition: A | Discharge status: Other Acute Inpatient Hospital | Payer: Self-pay | Source: Home / Self Care | Attending: Cardiology

## 2019-08-19 DIAGNOSIS — I959 Hypotension, unspecified: Secondary | ICD-10-CM

## 2019-08-19 DIAGNOSIS — E162 Hypoglycemia, unspecified: Secondary | ICD-10-CM

## 2019-08-19 DIAGNOSIS — N179 Acute kidney failure, unspecified: Secondary | ICD-10-CM

## 2019-08-19 DIAGNOSIS — D638 Anemia in other chronic diseases classified elsewhere: Secondary | ICD-10-CM

## 2019-08-19 DIAGNOSIS — I4581 Long QT syndrome: Secondary | ICD-10-CM

## 2019-08-19 DIAGNOSIS — I509 Heart failure, unspecified: Secondary | ICD-10-CM

## 2019-08-19 DIAGNOSIS — R6510 Systemic inflammatory response syndrome (SIRS) of non-infectious origin without acute organ dysfunction: Secondary | ICD-10-CM

## 2019-08-19 HISTORY — PX: RIGHT HEART CATH: CATH118263

## 2019-08-19 LAB — URINALYSIS, ROUTINE W REFLEX MICROSCOPIC
Bilirubin Urine: NEGATIVE
Glucose, UA: NEGATIVE mg/dL
Hgb urine dipstick: NEGATIVE
Ketones, ur: 5 mg/dL — AB
Leukocytes,Ua: NEGATIVE
Nitrite: NEGATIVE
Protein, ur: NEGATIVE mg/dL
Specific Gravity, Urine: 1.019 (ref 1.005–1.030)
pH: 5 (ref 5.0–8.0)

## 2019-08-19 LAB — CBC WITH DIFFERENTIAL/PLATELET
Abs Immature Granulocytes: 0.1 10*3/uL — ABNORMAL HIGH (ref 0.00–0.07)
Basophils Absolute: 0 10*3/uL (ref 0.0–0.1)
Basophils Relative: 0 %
Eosinophils Absolute: 0.3 10*3/uL (ref 0.0–0.5)
Eosinophils Relative: 3 %
HCT: 35.9 % — ABNORMAL LOW (ref 36.0–46.0)
Hemoglobin: 10.9 g/dL — ABNORMAL LOW (ref 12.0–15.0)
Immature Granulocytes: 1 %
Lymphocytes Relative: 19 %
Lymphs Abs: 2.5 10*3/uL (ref 0.7–4.0)
MCH: 23.6 pg — ABNORMAL LOW (ref 26.0–34.0)
MCHC: 30.4 g/dL (ref 30.0–36.0)
MCV: 77.9 fL — ABNORMAL LOW (ref 80.0–100.0)
Monocytes Absolute: 1.1 10*3/uL — ABNORMAL HIGH (ref 0.1–1.0)
Monocytes Relative: 8 %
Neutro Abs: 8.9 10*3/uL — ABNORMAL HIGH (ref 1.7–7.7)
Neutrophils Relative %: 69 %
Platelets: 328 10*3/uL (ref 150–400)
RBC: 4.61 MIL/uL (ref 3.87–5.11)
RDW: 16 % — ABNORMAL HIGH (ref 11.5–15.5)
WBC: 12.9 10*3/uL — ABNORMAL HIGH (ref 4.0–10.5)
nRBC: 0 % (ref 0.0–0.2)

## 2019-08-19 LAB — CBG MONITORING, ED: Glucose-Capillary: <10 mg/dL — CL (ref 70–99)

## 2019-08-19 LAB — BASIC METABOLIC PANEL
Anion gap: 17 — ABNORMAL HIGH (ref 5–15)
BUN: 49 mg/dL — ABNORMAL HIGH (ref 6–20)
CO2: 23 mmol/L (ref 22–32)
Calcium: 7.2 mg/dL — ABNORMAL LOW (ref 8.9–10.3)
Chloride: 99 mmol/L (ref 98–111)
Creatinine, Ser: 2.28 mg/dL — ABNORMAL HIGH (ref 0.44–1.00)
GFR calc Af Amer: 27 mL/min — ABNORMAL LOW (ref >60)
GFR calc non Af Amer: 23 mL/min — ABNORMAL LOW (ref >60)
Glucose, Bld: 96 mg/dL (ref 70–99)
Potassium: 3.4 mmol/L — ABNORMAL LOW (ref 3.5–5.1)
Sodium: 139 mmol/L (ref 135–145)

## 2019-08-19 LAB — MRSA PCR SCREENING: MRSA by PCR: POSITIVE — AB

## 2019-08-19 LAB — GLUCOSE, CAPILLARY
Glucose-Capillary: 59 mg/dL — ABNORMAL LOW (ref 70–99)
Glucose-Capillary: 106 mg/dL — ABNORMAL HIGH (ref 70–99)

## 2019-08-19 LAB — PROCALCITONIN: Procalcitonin: 7 ng/mL

## 2019-08-19 SURGERY — RIGHT HEART CATH
Anesthesia: LOCAL

## 2019-08-19 MED ORDER — MAGNESIUM OXIDE 400 MG PO TABS: 200.0000 mg | ORAL_TABLET | Freq: Every day | ORAL | Status: DC

## 2019-08-19 MED ORDER — PANTOPRAZOLE SODIUM 20 MG PO TBEC: 20.0000 mg | DELAYED_RELEASE_TABLET | Freq: Every day | ORAL | Status: DC

## 2019-08-19 MED ORDER — SODIUM CHLORIDE 0.9 % IV SOLN
INTRAVENOUS | Status: AC | PRN
  Administered 2019-08-19: 10 mL/h via INTRAVENOUS

## 2019-08-19 MED ORDER — FENTANYL CITRATE (PF) 100 MCG/2ML IJ SOLN
INTRAMUSCULAR | Status: AC
  Filled 2019-08-19: qty 2

## 2019-08-19 MED ORDER — HEPARIN (PORCINE) IN NACL 1000-0.9 UT/500ML-% IV SOLN
INTRAVENOUS | Status: DC | PRN
  Administered 2019-08-19: 500 mL

## 2019-08-19 MED ORDER — SODIUM CHLORIDE 0.9% FLUSH: 3.0000 mL | INTRAVENOUS | Status: DC | PRN

## 2019-08-19 MED ORDER — HEPARIN SODIUM (PORCINE) 5000 UNIT/ML IJ SOLN: 5000.0000 [IU] | Freq: Three times a day (TID) | INTRAMUSCULAR | Status: DC

## 2019-08-19 MED ORDER — MILRINONE LACTATE IN DEXTROSE 20-5 MG/100ML-% IV SOLN
0.2500 ug/kg/min | INTRAVENOUS | Status: DC
  Administered 2019-08-19: 13:00:00 0.125 ug/kg/min via INTRAVENOUS
  Administered 2019-08-20: 0.25 ug/kg/min via INTRAVENOUS
  Filled 2019-08-19 (×3): qty 100

## 2019-08-19 MED ORDER — MILRINONE LACTATE IN DEXTROSE 20-5 MG/100ML-% IV SOLN: 0.1250 ug/kg/min | INTRAVENOUS | Status: DC

## 2019-08-19 MED ORDER — CHLORHEXIDINE GLUCONATE CLOTH 2 % EX PADS: 6.0000 | MEDICATED_PAD | Freq: Every day | CUTANEOUS | Status: DC

## 2019-08-19 MED ORDER — POTASSIUM CHLORIDE CRYS ER 20 MEQ PO TBCR
20.0000 meq | EXTENDED_RELEASE_TABLET | Freq: Once | ORAL | Status: AC
  Administered 2019-08-19: 16:00:00 20 meq via ORAL
  Filled 2019-08-19: qty 1

## 2019-08-19 MED ORDER — ACETAMINOPHEN 325 MG PO TABS: 650.0000 mg | ORAL_TABLET | Freq: Four times a day (QID) | ORAL | Status: AC | PRN

## 2019-08-19 MED ORDER — CHLORHEXIDINE GLUCONATE CLOTH 2 % EX PADS
6.0000 | MEDICATED_PAD | Freq: Every day | CUTANEOUS | Status: DC
  Administered 2019-08-19 – 2019-08-20 (×2): 6 via TOPICAL

## 2019-08-19 MED ORDER — LIDOCAINE HCL (PF) 1 % IJ SOLN
INTRAMUSCULAR | Status: DC | PRN
  Administered 2019-08-19: 10 mL

## 2019-08-19 MED ORDER — SODIUM CHLORIDE 0.9 % IV SOLN: INTRAVENOUS | Status: DC

## 2019-08-19 MED ORDER — MIDAZOLAM HCL 2 MG/2ML IJ SOLN
INTRAMUSCULAR | Status: AC
  Filled 2019-08-19: qty 2

## 2019-08-19 MED ORDER — MIDODRINE HCL 10 MG PO TABS: 10.0000 mg | ORAL_TABLET | Freq: Three times a day (TID) | ORAL | Status: DC

## 2019-08-19 MED ORDER — SODIUM CHLORIDE 0.9 % IV SOLN: 250.0000 mL | INTRAVENOUS | Status: DC | PRN

## 2019-08-19 MED ORDER — FENTANYL CITRATE (PF) 100 MCG/2ML IJ SOLN
INTRAMUSCULAR | Status: DC | PRN
  Administered 2019-08-19: 25 ug via INTRAVENOUS

## 2019-08-19 MED ORDER — ASPIRIN 81 MG PO CHEW
81.0000 mg | CHEWABLE_TABLET | ORAL | Status: AC
  Administered 2019-08-19: 81 mg via ORAL
  Filled 2019-08-19: qty 1

## 2019-08-19 MED ORDER — VANCOMYCIN HCL 1250 MG/250ML IV SOLN: 1250.0000 mg | INTRAVENOUS | Status: DC

## 2019-08-19 MED ORDER — HEPARIN (PORCINE) IN NACL 1000-0.9 UT/500ML-% IV SOLN
INTRAVENOUS | Status: AC
  Filled 2019-08-19: qty 500

## 2019-08-19 MED ORDER — FUROSEMIDE 10 MG/ML IJ SOLN
80.0000 mg | Freq: Once | INTRAMUSCULAR | Status: DC
  Filled 2019-08-19: qty 8

## 2019-08-19 MED ORDER — SODIUM CHLORIDE 0.9 % IV SOLN: 2.0000 g | Freq: Two times a day (BID) | INTRAVENOUS | Status: DC

## 2019-08-19 MED ORDER — MIDAZOLAM HCL 2 MG/2ML IJ SOLN
INTRAMUSCULAR | Status: DC | PRN
  Administered 2019-08-19: 1 mg via INTRAVENOUS

## 2019-08-19 MED ORDER — ASPIRIN 81 MG PO CHEW: 81.0000 mg | CHEWABLE_TABLET | ORAL | Status: DC

## 2019-08-19 MED ORDER — LIDOCAINE HCL (PF) 1 % IJ SOLN
INTRAMUSCULAR | Status: AC
  Filled 2019-08-19: qty 30

## 2019-08-19 SURGICAL SUPPLY — 8 items
CATH SWAN GANZ VIP 7.5F (CATHETERS) ×1 IMPLANT
PACK CARDIAC CATHETERIZATION (CUSTOM PROCEDURE TRAY) ×2 IMPLANT
SHEATH PINNACLE 8F 10CM (SHEATH) ×1 IMPLANT
SHEATH PROBE COVER 6X72 (BAG) ×1 IMPLANT
SLEEVE REPOSITIONING LENGTH 30 (MISCELLANEOUS) ×1 IMPLANT
TRANSDUCER W/STOPCOCK (MISCELLANEOUS) ×2 IMPLANT
TUBING ART PRESS 72  MALE/FEM (TUBING) ×1
TUBING ART PRESS 72 MALE/FEM (TUBING) IMPLANT

## 2019-08-19 NOTE — Significant Event (Signed)
Rapid Response Event Note  I came by to see the patient this morning before her procedure. Patient overall felt and looks better this morning. She endorsed mild headache and felt a little dizzy at times but overall feels much better. BP improved overnight and she going for RHC around 1100.  Call [facility] as needed   Kambra Beachem R

## 2019-08-19 NOTE — Interval H&P Note (Signed)
History and Physical Interval Note:  08/19/2019 10:40 AM  Sabrina [name]  has presented today for surgery, with the diagnosis of Pulmonary Hypertension.  The various methods of treatment have been discussed with the patient and family. After consideration of risks, benefits and other options for treatment, the patient has consented to  Procedure(s): RIGHT HEART CATH ([name]) as a surgical intervention.  The patient's history has been reviewed, patient examined, no change in status, stable for surgery.  I have reviewed the patient's chart and labs.  Questions were answered to the patient's satisfaction.     Sabrina [name] [employer]

## 2019-08-19 NOTE — Progress Notes (Signed)
Patient ID: Sabrina [name], female   DOB: 07-15-1961, 59 y.o.   MRN: [id number]     Advanced Heart Failure Rounding Note  PCP-Cardiologist: Loralie Champagne, MD   Subjective:    BP continued to run low yesterday, Lasix held and midodrine started.  Creatinine up to 2.28 today.  BP better, SBP 80s-100s.    PCT 8.99 yesterday, she is afebrile.  Remains on cefepime/vancomycin. Cultures negative so far.   CT chest with atelectasis, no evidence for active pneumonitis.    Objective:   Weight Range: 93.3 kg Body mass index is 40.17 kg/m.   Vital Signs:   Temp:  [97.8 F (36.6 C)-98.4 F (36.9 C)] 97.8 F (36.6 C) (01/06 0447) Pulse Rate:  [56-74] 60 (01/06 0447) Resp:  [11-21] 17 (01/06 0447) BP: (75-108)/(45-86) 100/60 (01/06 0447) SpO2:  [93 %-100 %] 100 % (01/06 0447) FiO2 (%):  [2 %] 2 % (01/06 0302) Weight:  [93.3 kg] 93.3 kg (01/06 0446)    Weight change: Filed Weights   08/17/19 0220 08/19/19 0446  Weight: 89.4 kg 93.3 kg    Intake/Output:   Intake/Output Summary (Last 24 hours) at 08/19/2019 0800 Last data filed at 08/19/2019 0653 Gross per 24 hour  Intake 1438 ml  Output 2075 ml  Net -637 ml      Physical Exam    General:  Well appearing. No resp difficulty HEENT: Normal Neck: Supple. JVP 12-14 cm. Carotids 2+ bilat; no bruits. No lymphadenopathy or thyromegaly appreciated. Cor: PMI nondisplaced. Regular rate & rhythm with loud P2. No rubs, gallops or murmurs. Lungs: Clear Abdomen: Soft, nontender, nondistended. No hepatosplenomegaly. No bruits or masses. Good bowel sounds. Extremities: No cyanosis, clubbing, rash, edema Neuro: Alert & orientedx3, cranial nerves grossly intact. moves all 4 extremities w/o difficulty. Affect pleasant   Telemetry   NSR 70s  Labs    CBC Recent Labs    08/17/19 0421 08/18/19 0754 08/19/19 0309  WBC 16.7* 15.5* 12.9*  NEUTROABS 11.6*  --  8.9*  HGB 11.1* 10.3* 10.9*  HCT 39.2 33.9* 35.9*  MCV 83.4 77.8* 77.9*   PLT 480* 357 [id number]   Basic Metabolic Panel Recent Labs    08/17/19 0421 08/18/19 0754 08/19/19 0309  NA 144 138 139  K 4.5 3.6 3.4*  CL 106 97* 99  CO2 15* 25 23  GLUCOSE 58* 98 96  BUN 35* 42* 49*  CREATININE 1.79* 2.12* 2.28*  CALCIUM 7.7* 7.3* 7.2*  MG 2.9*  --   --    Liver Function Tests Recent Labs    08/17/19 0421  AST 91*  ALT 67*  ALKPHOS 82  BILITOT 1.1  PROT 7.9  ALBUMIN 3.6   Recent Labs    08/17/19 0421  LIPASE 45   Cardiac Enzymes No results for input(s): CKTOTAL, CKMB, CKMBINDEX, TROPONINI in the last 72 hours.  BNP: BNP (last 3 results) Recent Labs    03/27/19 1638 04/09/19 0921 08/17/19 0421  BNP 484.2* 494.5* 2,304.0*    ProBNP (last 3 results) No results for input(s): PROBNP in the last 8760 hours.   D-Dimer No results for input(s): DDIMER in the last 72 hours. Hemoglobin A1C No results for input(s): HGBA1C in the last 72 hours. Fasting Lipid Panel No results for input(s): CHOL, HDL, LDLCALC, TRIG, CHOLHDL, LDLDIRECT in the last 72 hours. Thyroid Function Tests No results for input(s): TSH, T4TOTAL, T3FREE, THYROIDAB in the last 72 hours.  Invalid input(s): FREET3  Other results:   Imaging  CT CHEST WO CONTRAST  Result Date: 08/18/2019 CLINICAL DATA:  Dyspnea on exertion EXAM: CT CHEST WITHOUT CONTRAST TECHNIQUE: Multidetector CT imaging of the chest was performed following the standard protocol without IV contrast. COMPARISON:  02/11/2013 chest CTA. FINDINGS: Cardiovascular: Normal heart size. Small volume pericardial fluid with area of low-density accumulation anteriorly. Aortic and coronary atherosclerotic calcification. No acute vascular finding without contrast. Main pulmonary artery is large at 4 cm diameter. Mediastinum/Nodes: Minimal sliding hiatal hernia. No adenopathy or inflammatory changes. Lungs/Pleura: Minimal dependent reticular density likely reflecting atelectasis. Mild heterogeneous aeration most often from  areas of air trapping. There is no edema, consolidation, effusion, or pneumothorax. Upper Abdomen: Postoperative stomach.  No acute finding. Musculoskeletal: Anasarca. Diffuse degenerative disc narrowing at thoracic spine with small endplate spurs. IMPRESSION: 1. No acute finding. 2. Small volume pericardial fluid that is new from 2014. 3. Large main pulmonary artery as seen with pulmonary hypertension. 4. Mild patchy air trapping likely related asthma history. 5. Mild dependent pulmonary density is attributable to atelectasis. No definite pneumonitis in this patient with scleroderma history. Electronically Signed   By: Monte Fantasia M.D.   On: 08/18/2019 10:43      Medications:     Scheduled Medications: . ambrisentan  5 mg Oral Daily  . aspirin EC  81 mg Oral Q breakfast  . calcium carbonate  3 tablet Oral TID WC  . furosemide  80 mg Intravenous Once  . heparin  5,000 Units Subcutaneous Q8H  . levothyroxine  75 mcg Oral QAC breakfast  . magnesium oxide  200 mg Oral Q breakfast  . midodrine  10 mg Oral TID WC  . multivitamin with minerals  1 tablet Oral Daily  . mycophenolate  720 mg Oral BID  . pantoprazole  20 mg Oral Daily  . potassium chloride  20 mEq Oral Once  . potassium chloride  20 mEq Oral Once  . Selexipag  1,200 mcg Oral Q12H  . senna-docusate  2 tablet Oral QHS  . sodium chloride flush  3 mL Intravenous Q12H  . sodium chloride flush  3 mL Intravenous Q12H     Infusions: . sodium chloride    . sodium chloride    . sodium chloride 10 mL/hr at 08/19/19 0653  . ceFEPime (MAXIPIME) IV 2 g (08/18/19 2059)  . dextrose 30 mL/hr at 08/18/19 2217  . vancomycin 1,250 mg (08/18/19 1521)     PRN Medications:  sodium chloride, sodium chloride, acetaminophen **OR** acetaminophen, sodium chloride flush, sodium chloride flush, temazepam   Assessment/Plan   1. Acute on chronic diastolic CHF with prominent RV failure. Echo showed EF 60-65% with moderately decreased RV  systolic function and severe RV enlargement.  D-shaped septum, PASP 99 mmHg. She had not been compliant with diuretics as outpatient.  She is volume overloaded on exam.  BP low yesterday, midodrine started.  SBP 80s-100s now.  Lasix held with soft BP, creatinine higher at 2.28.  - I will give one dose of Lasix 80 mg IV now.   - Continue midodrine 10 mg tid for now.   - May need RV support with milrinone, NO, etc (see below).  2. AKI on CKD stage 3: Creatinine higher this morning, likely combination of diuresis and low BP.  BP better today.  3. Pulmonary hypertension: Pulmonary hypertension: Severe PAH with preserved cardiac output by cath in 3/20 (systemic pressure). 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 Has been known for several years. In 2017, V/Q study was negative  for chronic PE and sleep study was negative. PFTs suggestive of restriction, has mild ILD on CT. Echo in 1/20 showed moderately dilated RV with moderately decreased systolic function. She is on ambrisentan 5 mg daily, developed significant edema at 10 mg daily. Malvin Johns has been increased to 1200 mcg bid, she cannot tolerate higher dose. She was on Adcirca 20 mg daily but this was held due to low BP this admission.  Significant RV failure as above.  - Continue ambrisentan 5 mg daily, will not increase.  - Continue selexipag 1200 mcg bid, cannot tolerate higher dose.  -Tadalafil on hold with low BP.  -Will plan RHC in am, will likely leave Swan in place.  We discussed risks/benefits of procedure and she agrees.  May need NO or milrinone for RV support PH lowering.   - In future, think she is going to need parenteral treatment.  4. Long QT interval: May be worsened by hypocalcemia but also has history of possible LQTS.  - Bisoprolol held for now with low BP.  5. Interstitial lung disease: Stable on CT in 8/20, PFTs suggest restriction. Likely related to scleroderma.  - Noncontrast CT chest did not show PNA  or active pneumonitis.  6. ID: Afebrile, WBCs 17 => 13.  PCT 8.99, suggestive of bacterial infection.  This may help explain hypotension.  Cultures negative so far.  - She is on cefepime and vancomycin empirically, continue.  7. Scleroderma: +ANA, +anti-centromere antibody. Diagnosis x 10 years. Has associated Raynauds, mild ILD, and severe PAH.    Length of Stay: 2  Loralie Champagne, MD  08/19/2019, 8:00 AM  Advanced Heart Failure Team Pager [phone] (M-F; 7a - 4p)  Please contact [facility] for night-coverage after hours (4p -7a ) and weekends on [email or web address]

## 2019-08-19 NOTE — Progress Notes (Signed)
Patient ID: Sabrina [name], female   DOB: June 30, 1961, 59 y.o.   MRN: [id number]  RHC Procedural Findings: Hemodynamics (mmHg) RA mean 6 RV 127/10 PA 125/28, mean 62 PCWP mean 11 Oxygen saturations: PA 65% AO 100% Cardiac Output (Fick) 4.75  Cardiac Index (Fick) 2.56 Cardiac Output (Thermo) 3.15 Cardiac Index (Thermo) 1.7 PVR 16 WU  Sabrina [name] has systemic PA pressure with normal filling pressures.  I am going to leave her Sabrina [name] in and transfer her to the CCU.  Will start milrinone at 0.125 and NO at 10 ppm and titrate up.  I spoke with Dr. Melvyn Novas at [facility].  She has been on triple oral therapy for pulmonary hypertension at home and appears to be failing this.  Significant RV dysfunction by echo.  I am going to transfer her to [facility] for evaluation for parenteral therapy for PAH (IV [place]).    Sabrina [name] 08/19/2019 12:31 PM

## 2019-08-19 NOTE — Discharge Summary (Addendum)
Advanced Heart Failure Team  Discharge Summary   Patient ID: Sabrina [name] MRN: [id number], DOB/AGE: 57-Oct-1962 57 y.o. Admit date: 08/17/2019 D/C date:     08/20/2019   Primary Discharge Diagnoses:  1. Acute on chronic diastolic CHF with prominent RV failure 2. AKI on CKD stage 3: 3. Pulmonary hypertension 4. Long QT interval: 5. Interstitial lung disease 6. 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  7.Scleroderma: +ANA, +anti-centromere antibody. Diagnosis x 10 years. Has associated Raynauds, mild ILD, and severe PAH.   Hospital Course:  Sabrina [name] is a 59 year old with a history of hypocalcemia, PFO, pulmonary hypertension, ILD, Raynauds, scleroderma, and RV dysfunction. PAH has been treated with Uptravi 1200 mcg+ ambrisentan 5 mg + adcirca 20 mg daily.   PAH thought to be related to scleroderma.  -V/Q scan (2017): No evidence for chronic PE.  - Sleep study (2017): Negative - Echo (1/20, [facility]): EF >55%, moderately dilated RV with moderately decreased systolic function, moderate TR, positive bubble study.  - RHC (3/20): mean RA 11, PA 128/35 mean 69, mean PCWP 15, CI 2.32, PVR 12.6 WU.  - Cannot tolerate 10 mg ambrisentan due to edema. - Echo (9/20): EF 60-65%, severe RV dilation with moderately decreased systolic function and D-shaped septum, PA systolic pressure 99 mmHg.  Presented to [facility] on 08/17/19 with increased shortness of breath. Due to concern for worsening PAH , transferred to [facility] on 08/17/19 for PAH/HF management.  Diuresed with IV lasix and set up for RHC. RHC showed severe systemic PA pressure with normal filling pressures on RHC today. Started on milrinone 0.125 mcg + iNO. She has been on triple oral therapy for pulmonary hypertension at home and appears to be failing this.  Significant RV dysfunction by echo. Transfer her to [facility] for evaluation for parenteral therapy for [facility] .   Dr Chanetta Marshall accepting MD at [facility]. Transferred  via [facility] transport.   Swan # today on Milrinone 0.125 mcg + iNO 30 PPM CVP 6 PA 118/41 (71) CO 4.78 CI 2.57   RHC 08/19/19 RA mean 6 RV 127/10 PA 125/28, mean 62 PCWP mean 11 Oxygen saturations: PA 65% AO 100% Cardiac Output (Fick) 4.75  Cardiac Index (Fick) 2.56 Cardiac Output (Thermo) 3.15 Cardiac Index (Thermo) 1.7 PVR 16 WU  1. Acute on chronic diastolic CHF with prominent RV failure. Echo showed EF 60-65% with moderately decreased RV systolic function and severe RV enlargement. D-shaped septum, PASP 99 mmHg. She had not been compliant with diuretics as outpatient. She is volume overloaded on exam.  BP low yesterday, midodrine started.  Filling pressure low on RHC today. Hold lasix.  RHC completed 1/6 with RA 6 PCWP 11, CI 1.7. Diuretics stopped. Off diuretics after cath.  - Todays cardiac index  2.6. Improved on milrinone 0.125 mcg.  - Continue midodrine 10 mg tid for now.   2. AKI on CKD stage 3:Creatinine coming down 2.28>1.58.  Off diuretics.   3. Pulmonary hypertension:Pulmonary hypertension: Severe PAH with preserved cardiac output by cath in 3/20 (systemic pressure). 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 Has been known for several years. In 2017, V/Q study was negative for chronic PE and sleep study was negative. PFTs suggestive of restriction, has mild ILD on CT. Echo in 1/20 showed moderately dilated RV with moderately decreased systolic function. She is on ambrisentan 5 mg daily, developed significant edema at 10 mg daily. Malvin Johns has been increased to 1200 mcg bid, she cannot tolerate higher dose.She  was on Adcirca 20 mg daily but this was held due to low BP this admission. Significant RV failure as above.  -RHC completed 1/6 with severe PAH and cardiac index 1.7. Started on milrinone + iNO.  PA pressures remain elevated on iNO 35 ppm + milrinone+ambrisentan 5 mg daily +  selexipag 1200 mcg bid.   - Transfer for possible flolan.  4.  Long QT interval: May be worsened by hypocalcemia but also has history of possible LQTS.  - Bisoprolol held for now with low BP.  5. Interstitial lung disease: Stable on CT in 8/20, PFTs suggest restriction. Likely related to scleroderma. - Noncontrast CT chest did not show PNA or active pneumonitis.  6. ID: Afebrile, WBCs 17 => 13.  PCT down to 3.25 , suggestive of bacterial infection.  This may help explain hypotension. Cultures negative so far.  - She is on cefepime and vancomycin empirically, continue.  7.Scleroderma: +ANA, +anti-centromere antibody. Diagnosis x 10 years. Has associated Raynauds, mild ILD, and severe PAH.  Discharge Vitals: Blood pressure (!) 85/34, pulse 75, temperature 98.8 F (37.1 C), resp. rate 16, height 5' (1.524 m), weight 92.3 kg, last menstrual period 10/31/2002, SpO2 98 %.  Labs: Lab Results  Component Value Date   WBC 12.4 (H) 08/20/2019   HGB 9.6 (L) 08/20/2019   HCT 31.8 (L) 08/20/2019   MCV 78.7 (L) 08/20/2019   PLT 292 08/20/2019    Recent Labs  Lab 08/17/19 0421 08/20/19 0230  NA 144 139  K 4.5 3.7  CL 106 105  CO2 15* 25  BUN 35* 36*  CREATININE 1.79* 1.58*  CALCIUM 7.7* 6.7*  PROT 7.9  --   BILITOT 1.1  --   ALKPHOS 82  --   ALT 67*  --   AST 91*  --   GLUCOSE 58* 114*   Lab Results  Component Value Date   CHOL 166 12/23/2017   HDL 35 (L) 12/23/2017   LDLCALC 109 (H) 12/23/2017   TRIG 109 12/23/2017   BNP (last 3 results) Recent Labs    03/27/19 1638 04/09/19 0921 08/17/19 0421  BNP 484.2* 494.5* 2,304.0*    ProBNP (last 3 results) No results for input(s): PROBNP in the last 8760 hours.   Diagnostic Studies/Procedures  RHC 08/19/2019 Hemodynamics (mmHg) RA mean 6 RV 127/10 PA 125/28, mean 62 PCWP mean 11 Oxygen saturations: PA 65% AO 100% Cardiac Output (Fick) 4.75  Cardiac Index (Fick) 2.56 Cardiac Output (Thermo) 3.15 Cardiac Index (Thermo) 1.7 PVR 16 WU CT CHEST WO CONTRAST  Result Date:  08/18/2019 CLINICAL DATA:  Dyspnea on exertion EXAM: CT CHEST WITHOUT CONTRAST TECHNIQUE: Multidetector CT imaging of the chest was performed following the standard protocol without IV contrast. COMPARISON:  02/11/2013 chest CTA. FINDINGS: Cardiovascular: Normal heart size. Small volume pericardial fluid with area of low-density accumulation anteriorly. Aortic and coronary atherosclerotic calcification. No acute vascular finding without contrast. Main pulmonary artery is large at 4 cm diameter. Mediastinum/Nodes: Minimal sliding hiatal hernia. No adenopathy or inflammatory changes. Lungs/Pleura: Minimal dependent reticular density likely reflecting atelectasis. Mild heterogeneous aeration most often from areas of air trapping. There is no edema, consolidation, effusion, or pneumothorax. Upper Abdomen: Postoperative stomach.  No acute finding. Musculoskeletal: Anasarca. Diffuse degenerative disc narrowing at thoracic spine with small endplate spurs. IMPRESSION: 1. No acute finding. 2. Small volume pericardial fluid that is new from 2014. 3. Large main pulmonary artery as seen with pulmonary hypertension. 4. Mild patchy air trapping likely related asthma history. 5. Mild  dependent pulmonary density is attributable to atelectasis. No definite pneumonitis in this patient with scleroderma history. Electronically Signed   By: Monte Fantasia M.D.   On: 08/18/2019 10:43   CARDIAC CATHETERIZATION  Result Date: 08/19/2019 1. Normal filling pressure. 2. Low cardiac output by thermodilution. 3. Severe (systemic level) pulmonary arterial hypertension. I will leave Swan in place, transfer to [place] with addition of milrinone 0.125.  I will start NO at 10 ppm and titrate up as tolerated.    Discharge Medications   Allergies as of 08/20/2019      Reactions   Peanut Oil Swelling   Potassium Iodide Other (See Comments)   facial swelling, also eyes, and ears   Ace Inhibitors Cough   Doxycycline Hives   Peanut-containing Drug  Products Nausea And Vomiting   Amlodipine Rash   Codeine Nausea And Vomiting   Latex Hives, Rash   Penicillins Hives, Itching, Rash   .Marland KitchenHas patient had a PCN reaction causing immediate rash, facial/tongue/throat swelling, SOB or lightheadedness with hypotension: No Has patient had a PCN reaction causing severe rash involving mucus membranes or skin necrosis: No Has patient had a PCN reaction that required hospitalization No Has patient had a PCN reaction occurring within the last 10 years: YES (3 years ago)  If all of the above answers are "NO", then may proceed with Cephalosporin use.      Medication List    STOP taking these medications   bisoprolol 5 MG tablet Commonly known as: ZEBETA   nitroGLYCERIN 0.4 MG SL tablet Commonly known as: NITROSTAT   potassium chloride 10 MEQ tablet Commonly known as: KLOR-CON   tadalafil (PAH) 20 MG tablet Commonly known as: Adcirca   torsemide 20 MG tablet Commonly known as: DEMADEX   triamcinolone ointment 0.1 % Commonly known as: KENALOG   UNABLE TO FIND     TAKE these medications   acetaminophen 325 MG tablet Commonly known as: TYLENOL Take 2 tablets (650 mg total) by mouth every 6 (six) hours as needed for mild pain (or Fever >/= 101).   ambrisentan 5 MG tablet Commonly known as: LETAIRIS Take 1 tablet (5 mg total) by mouth daily.   aspirin EC 81 MG tablet Take 1 tablet (81 mg total) by mouth daily with breakfast.   calcium carbonate 1250 (500 Ca) MG tablet Commonly known as: OS-CAL - dosed in mg of elemental calcium Take 3 tablets (1,500 mg of elemental calcium total) by mouth 3 (three) times daily with meals.   ceFEPIme 2 g in sodium chloride 0.9 % 100 mL Inject 2 g into the vein every 12 (twelve) hours.   heparin 5000 UNIT/ML injection Inject 1 mL (5,000 Units total) into the skin every 8 (eight) hours.   levothyroxine 75 MCG tablet Commonly known as: SYNTHROID TAKE 1 TABLET (75 MCG TOTAL) BY MOUTH DAILY BEFORE  BREAKFAST.   Magnesium Oxide 250 MG Tabs Take 1 tablet by mouth daily with breakfast. What changed: Another medication with the same name was added. Make sure you understand how and when to take each.   magnesium oxide 400 MG tablet Commonly known as: MAG-OX Take 0.5 tablets (200 mg total) by mouth daily with breakfast. What changed: You were already taking a medication with the same name, and this prescription was added. Make sure you understand how and when to take each.   midodrine 10 MG tablet Commonly known as: PROAMATINE Take 1 tablet (10 mg total) by mouth 3 (three) times daily with meals.  milrinone 20 MG/100 ML Soln infusion Commonly known as: PRIMACOR Inject 0.0117 mg/min into the vein continuous.   multivitamin with minerals Tabs tablet Take 1 tablet by mouth daily.   mycophenolate 360 MG Tbec EC tablet Commonly known as: MYFORTIC Take 720 mg by mouth 2 (two) times daily.   pantoprazole 20 MG tablet Commonly known as: PROTONIX TAKE 1 TABLET (20 MG) BY MOUTH EACH MORNING, MAY TAKE AN ADDITIONAL DOSE IN THE EVENING IF NEEDED FOR HEARTBURN/INDIGESTION. What changed: Another medication with the same name was added. Make sure you understand how and when to take each.   pantoprazole 20 MG tablet Commonly known as: PROTONIX Take 1 tablet (20 mg total) by mouth daily. What changed: You were already taking a medication with the same name, and this prescription was added. Make sure you understand how and when to take each.   senna-docusate 8.6-50 MG tablet Commonly known as: Senokot-S Take 2 tablets by mouth at bedtime.   temazepam 15 MG capsule Commonly known as: RESTORIL Take 1 capsule (15 mg total) by mouth at bedtime as needed for sleep.   Uptravi 1200 MCG Tabs Generic drug: Selexipag Take 1,200 mcg by mouth every 12 (twelve) hours.   vancomycin 1250 MG/250ML Soln Commonly known as: VANCOREADY Inject 250 mLs (1,250 mg total) into the vein daily.   Vitamin D  (Ergocalciferol) 1.25 MG (50000 UT) Caps capsule Commonly known as: DRISDOL Take 1 capsule (50,000 Units total) by mouth every Thursday. What changed: when to take this       Disposition   The patient will be discharged in stable condition to [facility] Discharge Instructions    Diet - low sodium heart healthy   Complete by: As directed    Increase activity slowly   Complete by: As directed          Duration of Discharge Encounter: Greater than 35 minutes   Signed, Simcha Speir NP-C  08/20/2019, 7:17 AM

## 2019-08-19 NOTE — Progress Notes (Signed)
PROGRESS NOTE    Sabrina [name]  [id number] DOB: 06-10-1961 DOA: 08/17/2019 PCP: Fayrene Helper, MD   Brief Narrative:  59 year old female with history of severe pulmonary hypertension with RV dysfunction, interstitial lung disease, hypothyroidism, connective tissue disease with scleroderma/Raynaud's, PFO, long QT syndrome, CKD stage IIIb, hypertension, chronic hypocalcemia and vitamin D deficiency presented with worsening dyspnea on exertion with lower extremity swelling.  She was admitted to [facility] and started on intravenous Lasix.  Cardiology evaluated the patient and recommended transfer to [facility]. Patient subsequently transferred to [facility] and assessed by the heart failure team and patient noted to be significantly volume overloaded on examination patient placed on IV Lasix as well as midodrine.  Blood pressure due to concerns for severe PAH patient for right heart catheterization today 08/19/2019.   Assessment & Plan:   Active Problems:   Acute on chronic diastolic (congestive) heart failure (HCC)   Severe sepsis (HCC)  #1 acute on chronic diastolic CHF with right heart failure/severe pulmonary hypertension Patient was transferred to [facility] as she presented to [facility] with worsening shortness of breath on exertion, lower extremity edema was compliant with medications.  Patient also noted to have elevated troponin felt secondary to demand ischemia as patient denied any chest pain.  Patient was seen in consultation by the heart failure team and patient started on Lasix 80 mg IV every 12 hours in addition to midodrine due to borderline blood pressure and dose increased per cardiology.  Continue ambristentan,selexipag.  Patient for right heart catheterization today.  Per cardiology.  2.  Systemic inflammatory response syndrome with severe lactic acidosis Likely secondary to problem #1.  Patient afebrile.  CT chest which was done was  negative for any acute infiltrate.  Influenza and COVID-19 testing was negative.  Chest x-ray negative for any acute infiltrates.  Procalcitonin elevated but trending down.  Lactic acid elevated but has trended down currently at 2.2 from 11.  Leukocytosis trending down.  Blood cultures ordered and are pending.  UA has been ordered but not done.  Continue empiric IV antibiotics and if no significant evidence of infection the next 24 to 48 hours with improvement with procalcitonin and lactic acid could likely discontinue antibiotics or treat empirically for total of 5 days.  3.  Hypotension in patient with hypertension Questionable etiology.  Felt secondary to problem #1.  Patient noted to have systolic blood pressures in the 70s to 80s overnight and had to be given a bolus of fluids.  Patient on midodrine currently at 10 mg 3 times daily.  Patient volume overloaded on examination.  Per cardiology.  4.  Acute kidney injury on chronic kidney disease stage IIIb Likely secondary to problem #1/cardiorenal syndrome.  Baseline creatinine approximately 1.4-1.9.  Creatinine seems to be plateauing and currently at 2.28 from 2.12 from 1.79.  Follow with diuresis.  5.  Hypoglycemia Questionable etiology.  Patient was on D10.  Blood glucose levels improved off D10.  Follow.  6.  Anemia of chronic disease No signs of bleeding.  H&H stable.  7.  Hypothyroidism Continue home dose Synthroid.  8.  Hypoglycemia with vitamin D deficiency Outpatient follow-up with endocrinology.  Continue oral calcium supplementation.  9.  Long QT syndrome Potassium at 3.4 this morning which has been repleted.  Replete electrolytes keep potassium close to 4 and magnesium around 2.  10.  Scleroderma Outpatient follow-up at [facility] rheumatology.     DVT prophylaxis: Heparin Code Status: Full  Family Communication: Updated patient.  No family at bedside. Disposition Plan: Per cardiology.   Consultants:   Cardiology/heart  failure team: Dr. Loralie Champagne 08/17/2019  Cardiology: Dr. Domenic Polite 08/17/2019  Procedures:   CT chest 08/18/2019  Chest x-ray 08/17/2019  Right heart cath pending for 08/19/2019  Antimicrobials:   Cefepime 08/17/2019  IV vancomycin 08/17/2019   Subjective: Patient lying in bed.  Patient states still with some shortness of breath.  Patient states some slight improvement with lower extremity edema.  Patient patient noted to have systolic blood pressures in the 70s to 80s yesterday and Lasix had to be held and patient given a bolus of fluids.  Objective: Vitals:   08/19/19 0446 08/19/19 0447 08/19/19 0805 08/19/19 0818  BP:  100/60 (!) 75/64 (!) 90/59  Pulse:  60 62 (!) 57  Resp:  17 12 14   Temp:  97.8 F (36.6 C) 97.7 F (36.5 C)   TempSrc:  Oral Oral   SpO2:  100% 100% 100%  Weight: 93.3 kg     Height:        Intake/Output Summary (Last 24 hours) at 08/19/2019 0922 Last data filed at 08/19/2019 0653 Gross per 24 hour  Intake 1320 ml  Output 2075 ml  Net -755 ml   Filed Weights   08/17/19 0220 08/19/19 0446  Weight: 89.4 kg 93.3 kg    Examination:  General exam: Appears calm and comfortable  Respiratory system: Bibasilar crackles.  No wheezing.  Respiratory effort normal. Cardiovascular system: S1 & S2 heard, RRR. No JVD, murmurs, rubs, gallops or clicks. Trace bilateral LE edema. Gastrointestinal system: Abdomen is nondistended, soft and nontender. No organomegaly or masses felt. Normal bowel sounds heard. Central nervous system: Alert and oriented. No focal neurological deficits. Extremities: Symmetric 5 x 5 power. Skin: Dry skin.  No rashes, lesions or ulcers Psychiatry: Judgement and insight appear normal. Mood & affect appropriate.     Data Reviewed: I have personally reviewed following labs and imaging studies  CBC: Recent Labs  Lab 08/17/19 0421 08/18/19 0754 08/19/19 0309  WBC 16.7* 15.5* 12.9*  NEUTROABS 11.6*  --  8.9*  HGB 11.1* 10.3* 10.9*  HCT 39.2  33.9* 35.9*  MCV 83.4 77.8* 77.9*  PLT 480* 357 [id number]   Basic Metabolic Panel: Recent Labs  Lab 08/17/19 0421 08/18/19 0754 08/19/19 0309  NA 144 138 139  K 4.5 3.6 3.4*  CL 106 97* 99  CO2 15* 25 23  GLUCOSE 58* 98 96  BUN 35* 42* 49*  CREATININE 1.79* 2.12* 2.28*  CALCIUM 7.7* 7.3* 7.2*  MG 2.9*  --   --    GFR: Estimated Creatinine Clearance: 28.1 mL/min (A) (by C-G formula based on SCr of 2.28 mg/dL (H)). Liver Function Tests: Recent Labs  Lab 08/17/19 0421  AST 91*  ALT 67*  ALKPHOS 82  BILITOT 1.1  PROT 7.9  ALBUMIN 3.6   Recent Labs  Lab 08/17/19 0421  LIPASE 45   No results for input(s): AMMONIA in the last 168 hours. Coagulation Profile: No results for input(s): INR, PROTIME in the last 168 hours. Cardiac Enzymes: No results for input(s): CKTOTAL, CKMB, CKMBINDEX, TROPONINI in the last 168 hours. BNP (last 3 results) No results for input(s): PROBNP in the last 8760 hours. HbA1C: No results for input(s): HGBA1C in the last 72 hours. CBG: Recent Labs  Lab 08/18/19 0214 08/18/19 0307 08/18/19 0336 08/18/19 0622 08/18/19 2036  GLUCAP 60* 59* 102* 94 122*   Lipid Profile: No results  for input(s): CHOL, HDL, LDLCALC, TRIG, CHOLHDL, LDLDIRECT in the last 72 hours. Thyroid Function Tests: No results for input(s): TSH, T4TOTAL, FREET4, T3FREE, THYROIDAB in the last 72 hours. Anemia Panel: No results for input(s): VITAMINB12, FOLATE, FERRITIN, TIBC, IRON, RETICCTPCT in the last 72 hours. Sepsis Labs: Recent Labs  Lab 08/17/19 0421 08/17/19 0630 08/18/19 0754 08/19/19 0309  PROCALCITON  --   --  8.99 7.00  LATICACIDVEN 10.1* 11.0* 2.2*  --     Recent Results (from the past 240 hour(s))  Culture, blood (routine x 2)     Status: None (Preliminary result)   Collection Time: 08/17/19  9:47 AM   Specimen: BLOOD LEFT HAND  Result Value Ref Range Status   Specimen Description   Final    BLOOD LEFT HAND BOTTLES DRAWN AEROBIC AND ANAEROBIC   Special  Requests   Final    Blood Culture results may not be optimal due to an inadequate volume of blood received in culture bottles   Culture   Final    NO GROWTH 2 DAYS Performed at [facility], [address] [place], [place] [address]    Report Status PENDING  Incomplete  Culture, blood (routine x 2)     Status: None (Preliminary result)   Collection Time: 08/17/19  9:48 AM   Specimen: BLOOD LEFT HAND  Result Value Ref Range Status   Specimen Description BLOOD LEFT HAND BOTTLES DRAWN AEROBIC ONLY  Final   Special Requests   Final    Blood Culture results may not be optimal due to an inadequate volume of blood received in culture bottles   Culture   Final    NO GROWTH 2 DAYS Performed at [facility], [address], [place], [place] [address]    Report Status PENDING  Incomplete  Respiratory Panel by RT PCR (Flu A&B, Covid) - Nasopharyngeal Swab     Status: None   Collection Time: 08/17/19 10:02 AM   Specimen: Nasopharyngeal Swab  Result Value Ref Range Status   SARS Coronavirus 2 by RT PCR NEGATIVE NEGATIVE Final    Comment: (NOTE) SARS-CoV-2 target nucleic acids are NOT DETECTED. The SARS-CoV-2 RNA is generally detectable in upper respiratoy specimens during the acute phase of infection. The lowest concentration of SARS-CoV-2 viral copies this assay can detect is 131 copies/mL. A negative result does not preclude SARS-Cov-2 infection and should not be used as the sole basis for treatment or other patient management decisions. A negative result may occur with  improper specimen collection/handling, submission of specimen other than nasopharyngeal swab, presence of viral mutation(s) within the areas targeted by this assay, and inadequate number of viral copies (<131 copies/mL). A negative result must be combined with clinical observations, patient history, and epidemiological information. The expected result is Negative. Fact Sheet for Patients:    PinkCheek.be Fact Sheet for Healthcare Providers:  GravelBags.it This test is not yet ap proved or cleared by the [place] FDA and  has been authorized for detection and/or diagnosis of SARS-CoV-2 by FDA under an Emergency Use Authorization (EUA). This EUA will remain  in effect (meaning this test can be used) for the duration of the COVID-19 declaration under Section 564(b)(1) of the Act, 21 U.S.C. section 360bbb-3(b)(1), unless the authorization is terminated or revoked sooner.    Influenza A by PCR NEGATIVE NEGATIVE Final   Influenza B by PCR NEGATIVE NEGATIVE Final    Comment: (NOTE) The Xpert Xpress SARS-CoV-2/FLU/RSV assay is intended as an aid in  the  diagnosis of influenza from Nasopharyngeal swab specimens and  should not be used as a sole basis for treatment. Nasal washings and  aspirates are unacceptable for Xpert Xpress SARS-CoV-2/FLU/RSV  testing. Fact Sheet for Patients: PinkCheek.be Fact Sheet for Healthcare Providers: GravelBags.it This test is not yet approved or cleared by the [place] FDA and  has been authorized for detection and/or diagnosis of SARS-CoV-2 by  FDA under an Emergency Use Authorization (EUA). This EUA will remain  in effect (meaning this test can be used) for the duration of the  Covid-19 declaration under Section 564(b)(1) of the Act, 21  U.S.C. section 360bbb-3(b)(1), unless the authorization is  terminated or revoked. Performed at [facility], [address], [place], [place] [address]          Radiology Studies: CT CHEST WO CONTRAST  Result Date: 08/18/2019 CLINICAL DATA:  Dyspnea on exertion EXAM: CT CHEST WITHOUT CONTRAST TECHNIQUE: Multidetector CT imaging of the chest was performed following the standard protocol without IV contrast. COMPARISON:  02/11/2013 chest CTA. FINDINGS: Cardiovascular: Normal heart  size. Small volume pericardial fluid with area of low-density accumulation anteriorly. Aortic and coronary atherosclerotic calcification. No acute vascular finding without contrast. Main pulmonary artery is large at 4 cm diameter. Mediastinum/Nodes: Minimal sliding hiatal hernia. No adenopathy or inflammatory changes. Lungs/Pleura: Minimal dependent reticular density likely reflecting atelectasis. Mild heterogeneous aeration most often from areas of air trapping. There is no edema, consolidation, effusion, or pneumothorax. Upper Abdomen: Postoperative stomach.  No acute finding. Musculoskeletal: Anasarca. Diffuse degenerative disc narrowing at thoracic spine with small endplate spurs. IMPRESSION: 1. No acute finding. 2. Small volume pericardial fluid that is new from 2014. 3. Large main pulmonary artery as seen with pulmonary hypertension. 4. Mild patchy air trapping likely related asthma history. 5. Mild dependent pulmonary density is attributable to atelectasis. No definite pneumonitis in this patient with scleroderma history. Electronically Signed   By: Monte Fantasia M.D.   On: 08/18/2019 10:43        Scheduled Meds: . ambrisentan  5 mg Oral Daily  . aspirin EC  81 mg Oral Q breakfast  . calcium carbonate  3 tablet Oral TID WC  . furosemide  80 mg Intravenous Once  . heparin  5,000 Units Subcutaneous Q8H  . levothyroxine  75 mcg Oral QAC breakfast  . magnesium oxide  200 mg Oral Q breakfast  . midodrine  10 mg Oral TID WC  . multivitamin with minerals  1 tablet Oral Daily  . mycophenolate  720 mg Oral BID  . pantoprazole  20 mg Oral Daily  . potassium chloride  20 mEq Oral Once  . potassium chloride  20 mEq Oral Once  . Selexipag  1,200 mcg Oral Q12H  . senna-docusate  2 tablet Oral QHS  . sodium chloride flush  3 mL Intravenous Q12H  . sodium chloride flush  3 mL Intravenous Q12H   Continuous Infusions: . sodium chloride    . sodium chloride    . sodium chloride 10 mL/hr at  08/19/19 0653  . ceFEPime (MAXIPIME) IV 2 g (08/18/19 2059)  . dextrose 30 mL/hr at 08/18/19 2217  . vancomycin 1,250 mg (08/18/19 1521)     LOS: 2 days    Time spent: 40 minutes    Irine Seal, MD [facility]  If 7PM-7AM, please contact night-coverage [email or web address] 08/19/2019, 9:22 AM

## 2019-08-19 NOTE — Progress Notes (Signed)
Pressurized saline to rt IJ distal port and to prox injectate. 0.9NS infusing at 10cc/hr to proximal infusion port and to side port. Saline locked left ac.

## 2019-08-19 NOTE — H&P (View-Only) (Signed)
Patient ID: Sabrina [name], female   DOB: 09-17-1960, 59 y.o.   MRN: [id number]     Advanced Heart Failure Rounding Note  PCP-Cardiologist: Loralie Champagne, MD   Subjective:    BP continued to run low yesterday, Lasix held and midodrine started.  Creatinine up to 2.28 today.  BP better, SBP 80s-100s.    PCT 8.99 yesterday, she is afebrile.  Remains on cefepime/vancomycin. Cultures negative so far.   CT chest with atelectasis, no evidence for active pneumonitis.    Objective:   Weight Range: 93.3 kg Body mass index is 40.17 kg/m.   Vital Signs:   Temp:  [97.8 F (36.6 C)-98.4 F (36.9 C)] 97.8 F (36.6 C) (01/06 0447) Pulse Rate:  [56-74] 60 (01/06 0447) Resp:  [11-21] 17 (01/06 0447) BP: (75-108)/(45-86) 100/60 (01/06 0447) SpO2:  [93 %-100 %] 100 % (01/06 0447) FiO2 (%):  [2 %] 2 % (01/06 0302) Weight:  [93.3 kg] 93.3 kg (01/06 0446)    Weight change: Filed Weights   08/17/19 0220 08/19/19 0446  Weight: 89.4 kg 93.3 kg    Intake/Output:   Intake/Output Summary (Last 24 hours) at 08/19/2019 0800 Last data filed at 08/19/2019 0653 Gross per 24 hour  Intake 1438 ml  Output 2075 ml  Net -637 ml      Physical Exam    General:  Well appearing. No resp difficulty HEENT: Normal Neck: Supple. JVP 12-14 cm. Carotids 2+ bilat; no bruits. No lymphadenopathy or thyromegaly appreciated. Cor: PMI nondisplaced. Regular rate & rhythm with loud P2. No rubs, gallops or murmurs. Lungs: Clear Abdomen: Soft, nontender, nondistended. No hepatosplenomegaly. No bruits or masses. Good bowel sounds. Extremities: No cyanosis, clubbing, rash, edema Neuro: Alert & orientedx3, cranial nerves grossly intact. moves all 4 extremities w/o difficulty. Affect pleasant   Telemetry   NSR 70s  Labs    CBC Recent Labs    08/17/19 0421 08/18/19 0754 08/19/19 0309  WBC 16.7* 15.5* 12.9*  NEUTROABS 11.6*  --  8.9*  HGB 11.1* 10.3* 10.9*  HCT 39.2 33.9* 35.9*  MCV 83.4 77.8* 77.9*   PLT 480* 357 [id number]   Basic Metabolic Panel Recent Labs    08/17/19 0421 08/18/19 0754 08/19/19 0309  NA 144 138 139  K 4.5 3.6 3.4*  CL 106 97* 99  CO2 15* 25 23  GLUCOSE 58* 98 96  BUN 35* 42* 49*  CREATININE 1.79* 2.12* 2.28*  CALCIUM 7.7* 7.3* 7.2*  MG 2.9*  --   --    Liver Function Tests Recent Labs    08/17/19 0421  AST 91*  ALT 67*  ALKPHOS 82  BILITOT 1.1  PROT 7.9  ALBUMIN 3.6   Recent Labs    08/17/19 0421  LIPASE 45   Cardiac Enzymes No results for input(s): CKTOTAL, CKMB, CKMBINDEX, TROPONINI in the last 72 hours.  BNP: BNP (last 3 results) Recent Labs    03/27/19 1638 04/09/19 0921 08/17/19 0421  BNP 484.2* 494.5* 2,304.0*    ProBNP (last 3 results) No results for input(s): PROBNP in the last 8760 hours.   D-Dimer No results for input(s): DDIMER in the last 72 hours. Hemoglobin A1C No results for input(s): HGBA1C in the last 72 hours. Fasting Lipid Panel No results for input(s): CHOL, HDL, LDLCALC, TRIG, CHOLHDL, LDLDIRECT in the last 72 hours. Thyroid Function Tests No results for input(s): TSH, T4TOTAL, T3FREE, THYROIDAB in the last 72 hours.  Invalid input(s): FREET3  Other results:   Imaging  CT CHEST WO CONTRAST  Result Date: 08/18/2019 CLINICAL DATA:  Dyspnea on exertion EXAM: CT CHEST WITHOUT CONTRAST TECHNIQUE: Multidetector CT imaging of the chest was performed following the standard protocol without IV contrast. COMPARISON:  02/11/2013 chest CTA. FINDINGS: Cardiovascular: Normal heart size. Small volume pericardial fluid with area of low-density accumulation anteriorly. Aortic and coronary atherosclerotic calcification. No acute vascular finding without contrast. Main pulmonary artery is large at 4 cm diameter. Mediastinum/Nodes: Minimal sliding hiatal hernia. No adenopathy or inflammatory changes. Lungs/Pleura: Minimal dependent reticular density likely reflecting atelectasis. Mild heterogeneous aeration most often from  areas of air trapping. There is no edema, consolidation, effusion, or pneumothorax. Upper Abdomen: Postoperative stomach.  No acute finding. Musculoskeletal: Anasarca. Diffuse degenerative disc narrowing at thoracic spine with small endplate spurs. IMPRESSION: 1. No acute finding. 2. Small volume pericardial fluid that is new from 2014. 3. Large main pulmonary artery as seen with pulmonary hypertension. 4. Mild patchy air trapping likely related asthma history. 5. Mild dependent pulmonary density is attributable to atelectasis. No definite pneumonitis in this patient with scleroderma history. Electronically Signed   By: Monte Fantasia M.D.   On: 08/18/2019 10:43      Medications:     Scheduled Medications: . ambrisentan  5 mg Oral Daily  . aspirin EC  81 mg Oral Q breakfast  . calcium carbonate  3 tablet Oral TID WC  . furosemide  80 mg Intravenous Once  . heparin  5,000 Units Subcutaneous Q8H  . levothyroxine  75 mcg Oral QAC breakfast  . magnesium oxide  200 mg Oral Q breakfast  . midodrine  10 mg Oral TID WC  . multivitamin with minerals  1 tablet Oral Daily  . mycophenolate  720 mg Oral BID  . pantoprazole  20 mg Oral Daily  . potassium chloride  20 mEq Oral Once  . potassium chloride  20 mEq Oral Once  . Selexipag  1,200 mcg Oral Q12H  . senna-docusate  2 tablet Oral QHS  . sodium chloride flush  3 mL Intravenous Q12H  . sodium chloride flush  3 mL Intravenous Q12H     Infusions: . sodium chloride    . sodium chloride    . sodium chloride 10 mL/hr at 08/19/19 0653  . ceFEPime (MAXIPIME) IV 2 g (08/18/19 2059)  . dextrose 30 mL/hr at 08/18/19 2217  . vancomycin 1,250 mg (08/18/19 1521)     PRN Medications:  sodium chloride, sodium chloride, acetaminophen **OR** acetaminophen, sodium chloride flush, sodium chloride flush, temazepam   Assessment/Plan   1. Acute on chronic diastolic CHF with prominent RV failure. Echo showed EF 60-65% with moderately decreased RV  systolic function and severe RV enlargement.  D-shaped septum, PASP 99 mmHg. She had not been compliant with diuretics as outpatient.  She is volume overloaded on exam.  BP low yesterday, midodrine started.  SBP 80s-100s now.  Lasix held with soft BP, creatinine higher at 2.28.  - I will give one dose of Lasix 80 mg IV now.   - Continue midodrine 10 mg tid for now.   - May need RV support with milrinone, NO, etc (see below).  2. AKI on CKD stage 3: Creatinine higher this morning, likely combination of diuresis and low BP.  BP better today.  3. Pulmonary hypertension: Pulmonary hypertension: Severe PAH with preserved cardiac output by cath in 3/20 (systemic pressure). 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 Has been known for several years. In 2017, V/Q study was negative  for chronic PE and sleep study was negative. PFTs suggestive of restriction, has mild ILD on CT. Echo in 1/20 showed moderately dilated RV with moderately decreased systolic function. She is on ambrisentan 5 mg daily, developed significant edema at 10 mg daily. Malvin Johns has been increased to 1200 mcg bid, she cannot tolerate higher dose. She was on Adcirca 20 mg daily but this was held due to low BP this admission.  Significant RV failure as above.  - Continue ambrisentan 5 mg daily, will not increase.  - Continue selexipag 1200 mcg bid, cannot tolerate higher dose.  -Tadalafil on hold with low BP.  -Will plan RHC in am, will likely leave Swan in place.  We discussed risks/benefits of procedure and she agrees.  May need NO or milrinone for RV support PH lowering.   - In future, think she is going to need parenteral treatment.  4. Long QT interval: May be worsened by hypocalcemia but also has history of possible LQTS.  - Bisoprolol held for now with low BP.  5. Interstitial lung disease: Stable on CT in 8/20, PFTs suggest restriction. Likely related to scleroderma.  - Noncontrast CT chest did not show PNA  or active pneumonitis.  6. ID: Afebrile, WBCs 17 => 13.  PCT 8.99, suggestive of bacterial infection.  This may help explain hypotension.  Cultures negative so far.  - She is on cefepime and vancomycin empirically, continue.  7. Scleroderma: +ANA, +anti-centromere antibody. Diagnosis x 10 years. Has associated Raynauds, mild ILD, and severe PAH.    Length of Stay: 2  Loralie Champagne, MD  08/19/2019, 8:00 AM  Advanced Heart Failure Team Pager [phone] (M-F; 7a - 4p)  Please contact [facility] for night-coverage after hours (4p -7a ) and weekends on [email or web address]

## 2019-08-20 LAB — POCT I-STAT EG7
Acid-Base Excess: 1 mmol/L (ref 0.0–2.0)
Acid-Base Excess: 1 mmol/L (ref 0.0–2.0)
Bicarbonate: 26.1 mmol/L (ref 20.0–28.0)
Bicarbonate: 26.3 mmol/L (ref 20.0–28.0)
Calcium, Ion: 0.94 mmol/L — ABNORMAL LOW (ref 1.15–1.40)
Calcium, Ion: 0.94 mmol/L — ABNORMAL LOW (ref 1.15–1.40)
HCT: 33 % — ABNORMAL LOW (ref 36.0–46.0)
HCT: 33 % — ABNORMAL LOW (ref 36.0–46.0)
Hemoglobin: 11.2 g/dL — ABNORMAL LOW (ref 12.0–15.0)
Hemoglobin: 11.2 g/dL — ABNORMAL LOW (ref 12.0–15.0)
O2 Saturation: 65 %
O2 Saturation: 65 %
Potassium: 3.4 mmol/L — ABNORMAL LOW (ref 3.5–5.1)
Potassium: 3.4 mmol/L — ABNORMAL LOW (ref 3.5–5.1)
Sodium: 140 mmol/L (ref 135–145)
Sodium: 140 mmol/L (ref 135–145)
TCO2: 27 mmol/L (ref 22–32)
TCO2: 28 mmol/L (ref 22–32)
pCO2, Ven: 44.9 mmHg (ref 44.0–60.0)
pCO2, Ven: 45 mmHg (ref 44.0–60.0)
pH, Ven: 7.372 (ref 7.250–7.430)
pH, Ven: 7.375 (ref 7.250–7.430)
pO2, Ven: 35 mmHg (ref 32.0–45.0)
pO2, Ven: 35 mmHg (ref 32.0–45.0)

## 2019-08-20 LAB — CBC WITH DIFFERENTIAL/PLATELET
Abs Immature Granulocytes: 0.08 10*3/uL — ABNORMAL HIGH (ref 0.00–0.07)
Basophils Absolute: 0 10*3/uL (ref 0.0–0.1)
Basophils Relative: 0 %
Eosinophils Absolute: 0.2 10*3/uL (ref 0.0–0.5)
Eosinophils Relative: 2 %
HCT: 31.8 % — ABNORMAL LOW (ref 36.0–46.0)
Hemoglobin: 9.6 g/dL — ABNORMAL LOW (ref 12.0–15.0)
Immature Granulocytes: 1 %
Lymphocytes Relative: 15 %
Lymphs Abs: 1.8 10*3/uL (ref 0.7–4.0)
MCH: 23.8 pg — ABNORMAL LOW (ref 26.0–34.0)
MCHC: 30.2 g/dL (ref 30.0–36.0)
MCV: 78.7 fL — ABNORMAL LOW (ref 80.0–100.0)
Monocytes Absolute: 1.2 10*3/uL — ABNORMAL HIGH (ref 0.1–1.0)
Monocytes Relative: 10 %
Neutro Abs: 9.1 10*3/uL — ABNORMAL HIGH (ref 1.7–7.7)
Neutrophils Relative %: 72 %
Platelets: 292 10*3/uL (ref 150–400)
RBC: 4.04 MIL/uL (ref 3.87–5.11)
RDW: 16 % — ABNORMAL HIGH (ref 11.5–15.5)
WBC: 12.4 10*3/uL — ABNORMAL HIGH (ref 4.0–10.5)
nRBC: 0 % (ref 0.0–0.2)

## 2019-08-20 LAB — BASIC METABOLIC PANEL
Anion gap: 9 (ref 5–15)
BUN: 36 mg/dL — ABNORMAL HIGH (ref 6–20)
CO2: 25 mmol/L (ref 22–32)
Calcium: 6.7 mg/dL — ABNORMAL LOW (ref 8.9–10.3)
Chloride: 105 mmol/L (ref 98–111)
Creatinine, Ser: 1.58 mg/dL — ABNORMAL HIGH (ref 0.44–1.00)
GFR calc Af Amer: 42 mL/min — ABNORMAL LOW (ref >60)
GFR calc non Af Amer: 36 mL/min — ABNORMAL LOW (ref >60)
Glucose, Bld: 114 mg/dL — ABNORMAL HIGH (ref 70–99)
Potassium: 3.7 mmol/L (ref 3.5–5.1)
Sodium: 139 mmol/L (ref 135–145)

## 2019-08-20 LAB — PROCALCITONIN: Procalcitonin: 3.25 ng/mL

## 2019-08-20 LAB — GLUCOSE, CAPILLARY: Glucose-Capillary: 99 mg/dL (ref 70–99)

## 2019-08-20 LAB — MAGNESIUM: Magnesium: 2.3 mg/dL (ref 1.7–2.4)

## 2019-08-20 MED ORDER — MILRINONE LACTATE IN DEXTROSE 20-5 MG/100ML-% IV SOLN: 0.2500 ug/kg/min | INTRAVENOUS | Status: DC

## 2019-08-20 MED ORDER — MIDODRINE HCL 5 MG PO TABS: 15.0000 mg | ORAL_TABLET | Freq: Three times a day (TID) | ORAL | 0 refills | Status: AC

## 2019-08-20 MED ORDER — VANCOMYCIN HCL 1250 MG/250ML IV SOLN
1250.0000 mg | INTRAVENOUS | Status: DC
  Administered 2019-08-20: 1250 mg via INTRAVENOUS
  Filled 2019-08-20: qty 250

## 2019-08-20 MED ORDER — MIDODRINE HCL 5 MG PO TABS
15.0000 mg | ORAL_TABLET | Freq: Three times a day (TID) | ORAL | Status: DC
  Administered 2019-08-20: 12:00:00 15 mg via ORAL
  Filled 2019-08-20: qty 3

## 2019-08-20 MED ORDER — MIDODRINE HCL 5 MG PO TABS
5.0000 mg | ORAL_TABLET | Freq: Once | ORAL | Status: AC
  Administered 2019-08-20: 5 mg via ORAL
  Filled 2019-08-20: qty 1

## 2019-08-20 MED ORDER — PANTOPRAZOLE SODIUM 40 MG PO TBEC
DELAYED_RELEASE_TABLET | ORAL | Status: AC
  Administered 2019-08-20: 20 mg via ORAL
  Filled 2019-08-20: qty 1

## 2019-08-20 NOTE — Consult Note (Signed)
   [facility] Inpatient Consult   08/20/2019  [place] 1961-07-29 [id number]    Patient reviewed for 25% high risk score of unplanned readmission with 3 hospitalizations in the past 6 months under her [employer].  Brief chart review of patient's medical record reveals that patient presented to [facility] hospital) on 08/17/19 with increased shortness of breath. Due to concern for worsening PAH, transferred to [facility] hospital) on 08/17/19 for PAH/ HF management.  Per MD notes, she has been accepted by Dr. Chanetta Marshall at [facility]) for evaluation of parenteral therapy for [facility] (pulmonary arterial hypertension). Patient expected to transfer to [facility] today.  Please refer to [facility] care management if there are any changes in disposition and needs, for follow-up as appropriate.   For questions and referral, please call:  Edwena Felty A. Margel Joens, BSN, RN-BC [facility] Liaison Cell: [phone]

## 2019-08-20 NOTE — Progress Notes (Signed)
RN called d/t [facility] transport team here to transport pt.  Per RN, pt needs to have a quick wean off nitric for transport (due to nitric not avail for transport).  Per RN, cardiology team has discussed that pt will need to be d/c from nitric for transport.    Over approximately 15 minutes, pt nitric weaned from 30 to 0 ppm.  PAP pre wean was 79/67.  Once pt on 0 ppm nitric, PAP 86/63.  RN and transport team aware.  VSS currently.

## 2019-08-20 NOTE — Plan of Care (Signed)
  Problem: Clinical Measurements: Goal: Ability to maintain clinical measurements within normal limits will improve Outcome: Adequate for Discharge Note: Patient being transferred to [facility] Goal: Will remain free from infection Outcome: Adequate for Discharge Goal: Diagnostic test results will improve Outcome: Adequate for Discharge Goal: Cardiovascular complication will be avoided Outcome: Adequate for Discharge   Problem: Activity: Goal: Risk for activity intolerance will decrease Outcome: Adequate for Discharge   Problem: Coping: Goal: Level of anxiety will decrease Outcome: Adequate for Discharge   Problem: Education: Goal: Knowledge of General Education information will improve Description: Including pain rating scale, medication(s)/side effects and non-pharmacologic comfort measures Outcome: Completed/Met   Problem: Health Behavior/Discharge Planning: Goal: Ability to manage health-related needs will improve Outcome: Completed/Met   Problem: Clinical Measurements: Goal: Respiratory complications will improve Outcome: Completed/Met   Problem: Nutrition: Goal: Adequate nutrition will be maintained Outcome: Completed/Met   Problem: Elimination: Goal: Will not experience complications related to bowel motility Outcome: Completed/Met Goal: Will not experience complications related to urinary retention Outcome: Completed/Met   Problem: Pain Managment: Goal: General experience of comfort will improve Outcome: Completed/Met   Problem: Safety: Goal: Ability to remain free from injury will improve Outcome: Completed/Met   Problem: Skin Integrity: Goal: Risk for impaired skin integrity will decrease Outcome: Completed/Met

## 2019-08-20 NOTE — Progress Notes (Addendum)
Patient ID: Sabrina [name], female   DOB: 11-28-1960, 59 y.o.   MRN: [id number]     Advanced Heart Failure Rounding Note  PCP-Cardiologist: Loralie Champagne, MD   Subjective:    Luiz Blare # today on Milrinone 0.125 mcg + iNO 30 PPM CVP 6 PA 118/41 (71) CO 4.78 CI 2.57   Denies SOB. Feels better overall this morning. Tm 99.1.  Creatinine trending down at 1.58. PCT down to 3.25.   Objective:   Weight Range: 92.3 kg Body mass index is 39.74 kg/m.   Vital Signs:   Temp:  [97.7 F (36.5 C)-99.5 F (37.5 C)] 99 F (37.2 C) (01/07 0600) Pulse Rate:  [57-109] 75 (01/07 0000) Resp:  [0-24] 12 (01/07 0600) BP: (75-108)/(44-76) 89/76 (01/07 0600) SpO2:  [95 %-100 %] 98 % (01/07 0600) Weight:  [92.3 kg] 92.3 kg (01/07 0500) Last BM Date: 08/19/19  Weight change: Filed Weights   08/17/19 0220 08/19/19 0446 08/20/19 0500  Weight: 89.4 kg 93.3 kg 92.3 kg    Intake/Output:   Intake/Output Summary (Last 24 hours) at 08/20/2019 0705 Last data filed at 08/20/2019 0600 Gross per 24 hour  Intake 540 ml  Output 850 ml  Net -310 ml      Physical Exam    General:  No resp difficulty HEENT: normal Neck: supple. no JVD. Carotids 2+ bilat; no bruits. No lymphadenopathy or thryomegaly appreciated. RIJ swan Cor: PMI nondisplaced. Regular rate & rhythm. No rubs, gallops or murmurs. Lungs: clear Abdomen: soft, nontender, nondistended. No hepatosplenomegaly. No bruits or masses. Good bowel sounds. Extremities: no cyanosis, clubbing, rash, edema Neuro: alert & orientedx3, cranial nerves grossly intact. moves all 4 extremities w/o difficulty. Affect pleasant   Telemetry   NSR 70-80s   Labs    CBC Recent Labs    08/19/19 0309 08/20/19 0230  WBC 12.9* 12.4*  NEUTROABS 8.9* 9.1*  HGB 10.9* 9.6*  HCT 35.9* 31.8*  MCV 77.9* 78.7*  PLT 328 [id number]   Basic Metabolic Panel Recent Labs    08/19/19 0309 08/20/19 0230  NA 139 139  K 3.4* 3.7  CL 99 105  CO2 23 25  GLUCOSE 96 114*    BUN 49* 36*  CREATININE 2.28* 1.58*  CALCIUM 7.2* 6.7*  MG  --  2.3   Liver Function Tests No results for input(s): AST, ALT, ALKPHOS, BILITOT, PROT, ALBUMIN in the last 72 hours. No results for input(s): LIPASE, AMYLASE in the last 72 hours. Cardiac Enzymes No results for input(s): CKTOTAL, CKMB, CKMBINDEX, TROPONINI in the last 72 hours.  BNP: BNP (last 3 results) Recent Labs    03/27/19 1638 04/09/19 0921 08/17/19 0421  BNP 484.2* 494.5* 2,304.0*    ProBNP (last 3 results) No results for input(s): PROBNP in the last 8760 hours.   D-Dimer No results for input(s): DDIMER in the last 72 hours. Hemoglobin A1C No results for input(s): HGBA1C in the last 72 hours. Fasting Lipid Panel No results for input(s): CHOL, HDL, LDLCALC, TRIG, CHOLHDL, LDLDIRECT in the last 72 hours. Thyroid Function Tests No results for input(s): TSH, T4TOTAL, T3FREE, THYROIDAB in the last 72 hours.  Invalid input(s): FREET3  Other results:   Imaging    CARDIAC CATHETERIZATION  Result Date: 08/19/2019 1. Normal filling pressure. 2. Low cardiac output by thermodilution. 3. Severe (systemic level) pulmonary arterial hypertension. I will leave Swan in place, transfer to [place] with addition of milrinone 0.125.  I will start NO at 10 ppm and titrate up as tolerated.  Medications:     Scheduled Medications: . ambrisentan  5 mg Oral Daily  . aspirin EC  81 mg Oral Q breakfast  . calcium carbonate  3 tablet Oral TID WC  . Chlorhexidine Gluconate Cloth  6 each Topical Daily  . heparin  5,000 Units Subcutaneous Q8H  . levothyroxine  75 mcg Oral QAC breakfast  . magnesium oxide  200 mg Oral Q breakfast  . midodrine  10 mg Oral TID WC  . multivitamin with minerals  1 tablet Oral Daily  . mycophenolate  720 mg Oral BID  . pantoprazole  20 mg Oral Daily  . Selexipag  1,200 mcg Oral Q12H  . senna-docusate  2 tablet Oral QHS  . sodium chloride flush  3 mL Intravenous Q12H  . sodium chloride  flush  3 mL Intravenous Q12H    Infusions: . sodium chloride    . ceFEPime (MAXIPIME) IV 2 g (08/20/19 0436)  . dextrose Stopped (08/19/19 [id number])  . milrinone 0.125 mcg/kg/min (08/19/19 1248)  . vancomycin 1,250 mg (08/18/19 1521)    PRN Medications: sodium chloride, acetaminophen **OR** acetaminophen, sodium chloride flush, temazepam   Assessment/Plan   1. Acute on chronic diastolic CHF with prominent RV failure. Echo showed EF 60-65% with moderately decreased RV systolic function and severe RV enlargement.  D-shaped septum, PASP 99 mmHg. She had not been compliant with diuretics as outpatient.  RHC completed 1/6 with RA 6 PCWP 11, CI 1.7. Diuretics stopped. Off diuretics after cath.  - Todays cardiac index  2.6. Improved on milrinone.  - Continue midodrine 10 mg tid for now.   2. AKI on CKD stage 3: Creatinine coming down 2.28>1.58.  Off diuretics.   3. Pulmonary hypertension: Pulmonary hypertension: Severe PAH with preserved cardiac output by cath in 3/20 (systemic pressure). 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 Has been known for several years. In 2017, V/Q study was negative for chronic PE and sleep study was negative. PFTs suggestive of restriction, has mild ILD on CT. Echo in 1/20 showed moderately dilated RV with moderately decreased systolic function. She is on ambrisentan 5 mg daily, developed significant edema at 10 mg daily. Malvin Johns has been increased to 1200 mcg bid, she cannot tolerate higher dose. She was on Adcirca 20 mg daily but this was held due to low BP this admission.  Significant RV failure as above.  - RHC completed 1/6 with severe PAH and cardiac index 1.7. Started on milrinone + iNO.  PA pressures remain elevated on iNO 35 ppm + milrinone + ambrisentan 5 mg daily + selexipag 1200 mcg bid (Adcirca held with low BP).  4. Long QT interval: May be worsened by hypocalcemia but also has history of possible LQTS.  - Off Bisoprolol with  hyptension.  5. Interstitial lung disease: Stable on CT in 8/20, PFTs suggest restriction. Likely related to scleroderma.  - Noncontrast CT chest did not show PNA or active pneumonitis.  6. ID: Afebrile, WBCs 17 => 13.  PCT 7 => 3.25 , suggestive of bacterial infection.  This may help explain hypotension.  Cultures negative so far.  - She is on cefepime and vancomycin empirically, continue.  7. Scleroderma: +ANA, +anti-centromere antibody. Diagnosis x 10 years. Has associated Raynauds, mild ILD, and severe PAH.   Transfer to [facility] today. Dr Chanetta Marshall accepting.   Length of Stay: 3  Amy Clegg, NP  08/20/2019, 7:05 AM  Advanced Heart Failure Team Pager [phone] (M-F; 7a - 4p)  Please contact [place]  [facility] for night-coverage after hours (4p -7a ) and weekends on [email or web address]  Patient seen with NP, agree with the above note.   Cardiac output improved on milrinone 0.125 + NO 30 ppm. PA pressure remains markedly high, CVP normal.    General: NAD Neck: No JVD, no thyromegaly or thyroid nodule.  Lungs: Clear to auscultation bilaterally with normal respiratory effort. CV: Nondisplaced PMI.  Heart regular S1/S2 with loud P2, no S3/S4, no murmur.  No peripheral edema.   Abdomen: Soft, nontender, no hepatosplenomegaly, no distention.  Skin: Intact without lesions or rashes.  Neurologic: Alert and oriented x 3.  Psych: Normal affect. Extremities: No clubbing or cyanosis.  HEENT: Normal.   Mrs Solarz has systemic PA pressure with normal filling pressures.  She has been on triple oral therapy for pulmonary hypertension at home and appears to be failing this.  Significant RV dysfunction by echo.  I am going to transfer her to [facility] for evaluation for parenteral therapy for [facility] ([facility]), she has a bed there today and will be transferred.  Today, cardiac index is improved though PA pressure remains markedly high.  We cannot transfer her on the NO, will increase milrinone to 0.25 and stop NO  to make sure she tolerates pre-transfer.   She remains on empiric vancomycin/Zosyn.  Procalcitonin decreasing, no definite infectious source, cultures negative.  Would continue for 5 day course of abx then stop.   CRITICAL CARE Performed by: Loralie Champagne  Total critical care time: 35 minutes  Critical care time was exclusive of separately billable procedures and treating other patients.  Critical care was necessary to treat or prevent imminent or life-threatening deterioration.  Critical care was time spent personally by me on the following activities: development of treatment plan with patient and/or surrogate as well as nursing, discussions with consultants, evaluation of patient's response to treatment, examination of patient, obtaining history from patient or surrogate, ordering and performing treatments and interventions, ordering and review of laboratory studies, ordering and review of radiographic studies, pulse oximetry and re-evaluation of patient's condition.  Loralie Champagne 08/20/2019 7:42 AM

## 2019-08-22 DIAGNOSIS — I50810 Right heart failure, unspecified: Secondary | ICD-10-CM | POA: Insufficient documentation

## 2019-08-22 LAB — CULTURE, BLOOD (ROUTINE X 2)
Culture: NO GROWTH
Culture: NO GROWTH

## 2019-08-23 LAB — URINE CULTURE: Culture: NO GROWTH

## 2019-08-25 ENCOUNTER — Telehealth: Payer: Self-pay

## 2019-08-25 MED ORDER — TADALAFIL (PAH) 20 MG PO TABS
20.00 | ORAL_TABLET | ORAL | Status: DC
Start: 2019-08-26 — End: 2019-08-25

## 2019-08-25 MED ORDER — SELEXIPAG 200 MCG PO TABS
1200.00 | ORAL_TABLET | ORAL | Status: DC
Start: 2019-08-25 — End: 2019-08-25

## 2019-08-25 MED ORDER — LEVOTHYROXINE SODIUM 75 MCG PO TABS
75.00 | ORAL_TABLET | ORAL | Status: DC
Start: 2019-08-26 — End: 2019-08-25

## 2019-08-25 MED ORDER — FERROUS SULFATE 324 MG PO TBEC
324.00 | DELAYED_RELEASE_TABLET | ORAL | Status: DC
Start: 2019-08-26 — End: 2019-08-25

## 2019-08-25 MED ORDER — PANTOPRAZOLE SODIUM 40 MG PO TBEC
40.00 | DELAYED_RELEASE_TABLET | ORAL | Status: DC
Start: 2019-08-25 — End: 2019-08-25

## 2019-08-25 MED ORDER — AMBRISENTAN 5 MG PO TABS
5.00 | ORAL_TABLET | ORAL | Status: DC
Start: 2019-08-26 — End: 2019-08-25

## 2019-08-25 MED ORDER — MYCOPHENOLATE SODIUM 180 MG PO TBEC
720.00 | DELAYED_RELEASE_TABLET | ORAL | Status: DC
Start: 2019-08-25 — End: 2019-08-25

## 2019-08-25 MED ORDER — CALCIUM CARBONATE ANTACID 750 MG PO CHEW
CHEWABLE_TABLET | ORAL | Status: DC
Start: 2019-08-25 — End: 2019-08-25

## 2019-08-25 MED ORDER — ACETAMINOPHEN 325 MG PO TABS
650.00 | ORAL_TABLET | ORAL | Status: DC
Start: ? — End: 2019-08-25

## 2019-08-25 MED ORDER — HEPARIN SODIUM (PORCINE) 5000 UNIT/ML IJ SOLN
5000.00 | INTRAMUSCULAR | Status: DC
Start: 2019-08-25 — End: 2019-08-25

## 2019-08-25 MED ORDER — GENERIC EXTERNAL MEDICATION
1.00 | Status: DC
Start: ? — End: 2019-08-25

## 2019-08-25 MED ORDER — CHOLECALCIFEROL 1.25 MG (50000 UT) PO CAPS
50000.00 | ORAL_CAPSULE | ORAL | Status: DC
Start: 2019-08-27 — End: 2019-08-25

## 2019-08-25 NOTE — Telephone Encounter (Signed)
[id number]  Attempted to contact patient to complete [facility] telephone call. No answer. Left message requesting call back. Will try again later. 1st attempt

## 2019-08-25 NOTE — Telephone Encounter (Signed)
[id number]  Attempted to contact patient to complete [facility] telephone call. No answer. Left message requesting call back. Will try again later. 2nd attempt

## 2019-08-25 NOTE — Telephone Encounter (Signed)
[id number]  Attempted to contact patient to complete [facility] telephone call. No answer. Left message requesting call back.  3rd attempt

## 2019-09-03 ENCOUNTER — Other Ambulatory Visit: Payer: Self-pay | Admitting: "Endocrinology

## 2019-09-03 ENCOUNTER — Telehealth (HOSPITAL_COMMUNITY): Payer: Self-pay | Admitting: Pharmacist

## 2019-09-03 ENCOUNTER — Telehealth: Payer: Self-pay | Admitting: "Endocrinology

## 2019-09-03 NOTE — Telephone Encounter (Signed)
Pt said that the calcium carbonate (OS-CAL - DOSED IN MG OF ELEMENTAL CALCIUM) 1250 (500 Ca) MG tablet she takes are on back order everywhere. She has one pill left and would like to know what do you suggest she takes in place of this?

## 2019-09-03 NOTE — Progress Notes (Signed)
There was a message that she was down to 2 pills of her calcium carbonate 1250 mg.  According to her report, she could not find it in the local pharmacies, is in [place].   I spoke with her.  She  has chewable calcium carbonate 500 mg at home. I advised her to chew 4 of them 3 times a day with meals and repeat CMP tomorrow at [facility] lab.  She has appointment to see Dr. Moshe Cipro tomorrow.

## 2019-09-03 NOTE — Telephone Encounter (Signed)
Patient Advocate Encounter   Received notification from [employer] that prior authorization for Ambrisentan is required.   PA submitted on CoverMyMeds Key  BMTATETC Status is pending   Will continue to follow.   Audry Riles, PharmD, BCPS, BCCP, CPP Heart Failure Clinic Pharmacist [phone]

## 2019-09-03 NOTE — Telephone Encounter (Signed)
I will call her.

## 2019-09-04 ENCOUNTER — Other Ambulatory Visit: Payer: Self-pay

## 2019-09-04 ENCOUNTER — Ambulatory Visit (INDEPENDENT_AMBULATORY_CARE_PROVIDER_SITE_OTHER): Payer: 59 | Admitting: Family Medicine

## 2019-09-04 ENCOUNTER — Encounter: Payer: Self-pay | Admitting: Family Medicine

## 2019-09-04 VITALS — BP 100/60 | HR 98 | Temp 98.0°F | Resp 15 | Ht 60.0 in | Wt 190.1 lb

## 2019-09-04 DIAGNOSIS — J849 Interstitial pulmonary disease, unspecified: Secondary | ICD-10-CM

## 2019-09-04 DIAGNOSIS — I5032 Chronic diastolic (congestive) heart failure: Secondary | ICD-10-CM | POA: Diagnosis not present

## 2019-09-04 DIAGNOSIS — I272 Pulmonary hypertension, unspecified: Secondary | ICD-10-CM

## 2019-09-04 DIAGNOSIS — F5101 Primary insomnia: Secondary | ICD-10-CM

## 2019-09-04 DIAGNOSIS — Z7689 Persons encountering health services in other specified circumstances: Secondary | ICD-10-CM

## 2019-09-04 DIAGNOSIS — D638 Anemia in other chronic diseases classified elsewhere: Secondary | ICD-10-CM

## 2019-09-04 DIAGNOSIS — N183 Chronic kidney disease, stage 3 unspecified: Secondary | ICD-10-CM

## 2019-09-04 DIAGNOSIS — M349 Systemic sclerosis, unspecified: Secondary | ICD-10-CM | POA: Diagnosis not present

## 2019-09-04 NOTE — Telephone Encounter (Signed)
Advanced Heart Failure Patient Advocate Encounter  Prior Authorization for Ambrisentan has been approved.    PA# [id number] Effective dates: 09/04/19 through 09/02/2020  Audry Riles, PharmD, BCPS, BCCP, CPP Heart Failure Clinic Pharmacist [phone]

## 2019-09-04 NOTE — Patient Instructions (Signed)
Happy New Year! May you have a year filled with hope, love, happiness and laughter.  I appreciate the opportunity to provide you with care for your health and wellness. Today we discussed: recent [place] hospital stay  Follow up: 12/28/2019 as scheduled  No labs or referrals today  Please continue to take your medications as directed :) Maintain the low salt and water restrictions.  We hope this year is much better for you! :)  Please continue to practice social distancing to keep you, your family, and our community safe.  If you must go out, please wear a mask and practice good handwashing.  It was a pleasure to see you and I look forward to continuing to work together on your health and well-being. Please do not hesitate to call the office if you need care or have questions about your care.  Have a wonderful day and week. With Gratitude, Cherly Beach, DNP, AGNP-BC

## 2019-09-04 NOTE — Progress Notes (Signed)
Subjective:  Patient ID: Sabrina [name], female    DOB: Feb 11, 1961  Age: 59 y.o. MRN: [id number]  CC:  Chief Complaint  Patient presents with  . Hospitalization Follow-up      HPI  HPI  Sabrina [name] is a 59 y.o. female with history severe pulmonary hypertension with RV dysfunction, scleroderma, ILD, PFO, long QT syndrome, hypothyroidism, CKD Stage III, among others. She was transfered to [facility] from [facility] for consideration of parenteral therapy for severe pHTN on 08/20/19.  Sabrina [name] initially presented to [facility] on 1/3 in the setting of gradually worsening LE edema and shortness of breath, and she was started on IV Lasix. She was evaluated by cardiology who recommended transfer to [facility]. Upon arrival to [facility] on 1/4, patient was noted to be afebrile, with elevated white count 16.7 and lactate 10.1. She was not tachycardic, NSR (HR 91), BNP 2300 (from prior ~500), Cr 1.8 (from reported baseline around 1.5), "flat hsTnT 200-300". Chest imaging noted atelectasis, no evidence of active PNA. Blood cultures were obtained and per OSH records are NGTD at 48 hours. She was started on vanc/cefepime for empiric antibiotic coverage. She was evaluated by heart failure team at [facility] and noted to be significantly volume up on exam. ECHO reportedly demonstrated EF 60-65% with moderately decreased RV systolic function and severe RV enlargement, D-shaped septum, PASP 99 mmHg. She was started on IV Lasix, however became hypotensive and required midodrine 10 mg TID to maintain pressures. Output is not documented, however patient reports she put out a good amount of urine and saw improvement in her edema. She was contrinued on her home ambrisentan 5 mg daily (developed significant edema with 10 mg so not increased), and selexipag 1200 mcg BID (cannot tolerate higher), and tadalafil was held due to hypotension.   Right heart cath: Hemodynamics  (mmHg) RA mean 6 RV 127/10 PA 125/28, mean 62 PCWP mean 11 Oxygen saturations: PA 65% AO 100% Cardiac Output (Fick) 4.75  Cardiac Index (Fick) 2.56 Cardiac Output (Thermo) 3.15 Cardiac Index (Thermo) 1.7 PVR 16 WU  She was noted to have systemic PA pressure with normal filling pressures. [place] was maintained and she was transferred to [facility] given failure of triple therapy for [facility], for consideration of parenteral therapy for PAH. Prior to transfer she was started on milrinone at 0.25 and iNO at 10 ppm. Most recent labs prior to transfer notable for WBC 12.9, Cr 2.28. Cultures reportedly still negative at that time.   Upon arrival to [facility], Sabrina [name] was in no acute distress, VSS T 36.9, HR 75, BP 106/57 on milrinone 0.25 mcg/kg/min, SpO2 94 on 2L [place] and iNO 10 ppm. EKG in NSR, with right axis deviation, RVH, QTc 495. Initial labs notable for lactate 0.8, WBC 12.9 (72% neutrophils). She was speaking in full sentences without increased work of breathing. She reported onset of symptoms, namely increased swelling, over the week prior to presentation on 1/3. She was prescribed torsemide 60 mg qAM and 40 mg qPM, but she takes it as 40 mg BID -- she reports difficulty with taking her full daily dose, so will intermittently only have 40 -60 mg per day. She reports compliance in the days prior to her presentation, however, because she was at home/not working; she does not take an extra PRN dose for swelling.   Initially admitted to the CCU as a transfer from OSH on milrinone and iNO given elevated  PA pressures, transferred for consideration of IV therapy. She was diuresed in the CCU and trialed on tadalafil, responding appropriately. Her milrinone and iNO were weaned off and she was diuresed negative 7 liters on the admission  She was discharged on torsemide 80 mg daily, tadalafil 20 mg PO daily, ambrisentan 5 mg daily, and selexipag 1,200 mcg BID. Her discharge weight was 82.9 kg. She will  follow up in pulmonary vascular clinic with plan to initiate IV therapy in the next ~3 weeks.  She reports taking medications as directed and not missing, skipping, or changing the doses.   She was taken off Restoril for sleep, she was not told why. She reports trouble sleeping still. Has not tried OTC to help with this.  Today patient denies signs and symptoms of COVID 19 infection including fever, chills, cough, shortness of breath, and headache. Past Medical, Surgical, Social History, Allergies, and Medications have been Reviewed.   Past Medical History:  Diagnosis Date  . Acute on chronic congestive heart failure ([place])   . Acute on chronic diastolic (congestive) heart failure ([place]) 08/17/2019  . Arthritis   . Asthma   . Chronic bronchitis ([place])   . Endometriosis   . Essential hypertension   . Fibroid uterus   . GERD (gastroesophageal reflux disease)   . History of hiatal hernia   . History of nuclear stress test    a. Myoview 6/16: There is no resting or stress perfusion defect consistent with no prior infarct and no ischemia. EF 73 %  The patient was hypertensive prior and during the study. Cardiac cath 04/2016 showed normal coronary arteries  . Hx of iron deficiency anemia   . Hypersomnia with sleep apnea   . Interstitial lung disease ([place])   . Long Q-T syndrome    On beta-blocker  . Menorrhagia   . Obesity   . PFO (patent foramen ovale)   . Pneumonia   . Precordial pain   . Pulmonary arterial hypertension ([place])   . Right heart failure ([place])   . 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 Scleroderma ([place])   . Secondary hyperthyroidism   . Severe sepsis ([place]) 08/17/2019  . Tinnitus    Left  . Tubular adenoma of colon   . Type 2 diabetes mellitus ([place])   . Vertigo     Current Meds  Medication Sig  . acetaminophen (TYLENOL) 325 MG tablet Take 2 tablets (650 mg total) by mouth every 6 (six) hours as needed for mild pain (or Fever >/= 101).  [name] ambrisentan  (LETAIRIS) 5 MG tablet Take 1 tablet (5 mg total) by mouth daily.  [name] aspirin EC 81 MG tablet Take 1 tablet (81 mg total) by mouth daily with breakfast.  . calcium carbonate (OS-CAL - DOSED IN MG OF ELEMENTAL CALCIUM) 1250 (500 Ca) MG tablet Take 3 tablets (1,500 mg of elemental calcium total) by mouth 3 (three) times daily with meals.  [name] ceFEPIme 2 g in sodium chloride 0.9 % 100 mL Inject 2 g into the vein every 12 (twelve) hours.  . heparin 5000 UNIT/ML injection Inject 1 mL (5,000 Units total) into the skin every 8 (eight) hours.  [name] levothyroxine (SYNTHROID) 75 MCG tablet TAKE 1 TABLET (75 MCG TOTAL) BY MOUTH DAILY BEFORE BREAKFAST.  . magnesium oxide (MAG-OX) 400 MG tablet Take 0.5 tablets (200 mg total) by mouth daily with breakfast.  . Magnesium Oxide 250 MG TABS Take 1 tablet by mouth daily with breakfast.  . midodrine (PROAMATINE)  5 MG tablet Take 3 tablets (15 mg total) by mouth 3 (three) times daily with meals.  . milrinone (PRIMACOR) 20 MG/100 ML SOLN infusion Inject 0.0233 mg/min into the vein continuous.  . Multiple Vitamin (MULTIVITAMIN WITH MINERALS) TABS tablet Take 1 tablet by mouth daily.  . mycophenolate (MYFORTIC) 360 MG TBEC EC tablet Take 720 mg by mouth 2 (two) times daily.   . pantoprazole (PROTONIX) 20 MG tablet TAKE 1 TABLET (20 MG) BY MOUTH EACH MORNING, MAY TAKE AN ADDITIONAL DOSE IN THE EVENING IF NEEDED FOR HEARTBURN/INDIGESTION.  [name] pantoprazole (PROTONIX) 20 MG tablet Take 1 tablet (20 mg total) by mouth daily.  . Selexipag (UPTRAVI) 1200 MCG TABS Take 1,200 mcg by mouth every 12 (twelve) hours.  . senna-docusate (SENOKOT-S) 8.6-50 MG tablet Take 2 tablets by mouth at bedtime.  . temazepam (RESTORIL) 15 MG capsule Take 1 capsule (15 mg total) by mouth at bedtime as needed for sleep.  . vancomycin (VANCOREADY) 1250 MG/250ML SOLN Inject 250 mLs (1,250 mg total) into the vein daily.  . Vitamin D, Ergocalciferol, (DRISDOL) 1.25 MG (50000 UT) CAPS capsule Take 1 capsule  (50,000 Units total) by mouth every Thursday. (Patient taking differently: Take 50,000 Units by mouth every 7 (seven) days. )    ROS:  Review of Systems  Constitutional: Negative.   HENT: Negative.   Eyes: Negative.   Respiratory: Negative.   Cardiovascular: Negative.   Gastrointestinal: Negative.   Genitourinary: Negative.   Musculoskeletal: Negative.   Skin: Negative.   Neurological: Negative.   Endo/Heme/Allergies: Negative.   Psychiatric/Behavioral: Negative.   All other systems reviewed and are negative.    Objective:   Today's Vitals: BP 100/60   Pulse 98   Temp 98 F (36.7 C) (Temporal)   Resp 15   Ht 5' (1.524 m)   Wt 190 lb 1.9 oz (86.2 kg)   LMP 10/31/2002   SpO2 91%   BMI 37.13 kg/m  Vitals with BMI 09/04/2019 08/20/2019 08/20/2019  Height 5\' 0"  - -  Weight 190 lbs 2 oz - -  BMI 0000000 - -  Systolic [id number]  Diastolic 60 63 66  Pulse 98 - -     Physical Exam Vitals and nursing note reviewed.  Constitutional:      Appearance: Normal appearance. She is well-developed and well-groomed. She is obese.  HENT:     Head: Normocephalic and atraumatic.     Right Ear: External ear normal.     Left Ear: External ear normal.     Nose: Nose normal.     Mouth/Throat:     Mouth: Mucous membranes are moist.     Pharynx: Oropharynx is clear.  Eyes:     General:        Right eye: No discharge.        Left eye: No discharge.     Conjunctiva/sclera: Conjunctivae normal.  Cardiovascular:     Rate and Rhythm: Normal rate and regular rhythm.     Pulses: Normal pulses.     Heart sounds: Normal heart sounds.  Pulmonary:     Effort: Pulmonary effort is normal.     Breath sounds: Normal breath sounds.  Musculoskeletal:        General: Normal range of motion.     Cervical back: Normal range of motion and neck supple.  Skin:    General: Skin is warm.  Neurological:     General: No focal deficit present.     Mental Status: She  is alert and oriented to person, place,  and time.  Psychiatric:        Attention and Perception: Attention normal.        Mood and Affect: Mood normal.        Speech: Speech normal.        Behavior: Behavior normal. Behavior is cooperative.        Thought Content: Thought content normal.        Cognition and Memory: Cognition normal.        Judgment: Judgment normal.     Assessment   1. Encounter for support and coordination of transition of care   2. Chronic diastolic heart failure (HCC)   3. Scleroderma ([place])   4. ILD (interstitial lung disease) ([place])   5. Morbid obesity ([place])   6. Pulmonary hypertension (HCC)   7. Stage 3 chronic kidney disease, unspecified whether stage 3a or 3b CKD   8. Primary insomnia   9. Anemia of chronic disease     Tests ordered No orders of the defined types were placed in this encounter.    Plan: Please see assessment and plan per problem list above.   No orders of the defined types were placed in this encounter.   Patient to follow-up in 12/28/2019 .  Perlie Mayo, NP

## 2019-09-05 LAB — COMPLETE METABOLIC PANEL WITH GFR
AG Ratio: 1.2 (calc) (ref 1.0–2.5)
ALT: 13 U/L (ref 6–29)
AST: 16 U/L (ref 10–35)
Albumin: 3.8 g/dL (ref 3.6–5.1)
Alkaline phosphatase (APISO): 56 U/L (ref 37–153)
BUN/Creatinine Ratio: 24 (calc) — ABNORMAL HIGH (ref 6–22)
BUN: 38 mg/dL — ABNORMAL HIGH (ref 7–25)
CO2: 29 mmol/L (ref 20–32)
Calcium: 7.2 mg/dL — ABNORMAL LOW (ref 8.6–10.4)
Chloride: 97 mmol/L — ABNORMAL LOW (ref 98–110)
Creat: 1.6 mg/dL — ABNORMAL HIGH (ref 0.50–1.05)
GFR, Est African American: 41 mL/min/{1.73_m2} — ABNORMAL LOW (ref >=60)
GFR, Est Non African American: 36 mL/min/{1.73_m2} — ABNORMAL LOW (ref >=60)
Globulin: 3.2 g/dL (calc) (ref 1.9–3.7)
Glucose, Bld: 90 mg/dL (ref 65–139)
Potassium: 3.8 mmol/L (ref 3.5–5.3)
Sodium: 143 mmol/L (ref 135–146)
Total Bilirubin: 0.4 mg/dL (ref 0.2–1.2)
Total Protein: 7 g/dL (ref 6.1–8.1)

## 2019-09-07 ENCOUNTER — Encounter: Payer: Self-pay | Admitting: Family Medicine

## 2019-09-07 DIAGNOSIS — Z7689 Persons encountering health services in other specified circumstances: Secondary | ICD-10-CM | POA: Insufficient documentation

## 2019-09-07 NOTE — Assessment & Plan Note (Signed)
Is stable, managed at [facility] on myfortic and takes as directed.

## 2019-09-07 NOTE — Assessment & Plan Note (Signed)
IS being followed by [facility], reports she will take medications as directed.

## 2019-09-07 NOTE — Assessment & Plan Note (Signed)
Advised to maintain Iron supplementation

## 2019-09-07 NOTE — Assessment & Plan Note (Signed)
Encouraged to take melatonin to see if that will help with sleep.

## 2019-09-07 NOTE — Assessment & Plan Note (Signed)
Has been recently referred to nephrology

## 2019-09-07 NOTE — Assessment & Plan Note (Signed)
Increased skin involvement.

## 2019-09-07 NOTE — Assessment & Plan Note (Signed)
Sabrina [name] is re-educated about the importance of exercise daily to help with weight management. A minumum of 30 minutes daily is recommended. Additionally, importance of healthy food choices  with portion control discussed  Wt Readings from Last 3 Encounters:  09/04/19 190 lb 1.9 oz (86.2 kg)  08/20/19 203 lb 7.8 oz (92.3 kg)  07/27/19 195 lb (88.5 kg)

## 2019-09-07 NOTE — Assessment & Plan Note (Signed)
As documented in the note. Is followed by [facility] She is encouranged to take meds as directed and contact us or her heart dr if any changes occur.

## 2019-09-07 NOTE — Assessment & Plan Note (Signed)
Pt was admitted for [facility] and CHF to [facility] then transferred to [facility]. She will continue follow up with [facility]. She is encouraged to continue all medications as they are ordered.

## 2019-09-09 ENCOUNTER — Other Ambulatory Visit: Payer: Self-pay

## 2019-09-09 ENCOUNTER — Ambulatory Visit: Payer: 59 | Attending: Internal Medicine

## 2019-09-09 DIAGNOSIS — Z20822 Contact with and (suspected) exposure to covid-19: Secondary | ICD-10-CM

## 2019-09-10 LAB — NOVEL CORONAVIRUS, NAA: SARS-CoV-2, NAA: NOT DETECTED

## 2019-09-28 MED ORDER — LORAZEPAM 2 MG/ML IJ SOLN
0.50 | INTRAMUSCULAR | Status: DC
Start: ? — End: 2019-09-28

## 2019-09-28 MED ORDER — VITAMINS/MINERALS PO TABS
1.00 | ORAL_TABLET | ORAL | Status: DC
Start: 2019-09-29 — End: 2019-09-28

## 2019-09-28 MED ORDER — PANTOPRAZOLE SODIUM 40 MG PO TBEC
40.00 | DELAYED_RELEASE_TABLET | ORAL | Status: DC
Start: 2019-09-29 — End: 2019-09-28

## 2019-09-28 MED ORDER — ENOXAPARIN SODIUM 40 MG/0.4ML ~~LOC~~ SOLN
40.00 | SUBCUTANEOUS | Status: DC
Start: 2019-09-29 — End: 2019-09-28

## 2019-09-28 MED ORDER — LIDOCAINE HCL 1 % IJ SOLN
3.00 | INTRAMUSCULAR | Status: DC
Start: ? — End: 2019-09-28

## 2019-09-28 MED ORDER — ASPIRIN 81 MG PO TBEC
81.00 | DELAYED_RELEASE_TABLET | ORAL | Status: DC
Start: 2019-09-29 — End: 2019-09-28

## 2019-09-28 MED ORDER — CHOLECALCIFEROL 1.25 MG (50000 UT) PO CAPS
50000.00 | ORAL_CAPSULE | ORAL | Status: DC
Start: 2019-10-01 — End: 2019-09-28

## 2019-09-28 MED ORDER — GENERIC EXTERNAL MEDICATION
1.00 | Status: DC
Start: 2019-09-28 — End: 2019-09-28

## 2019-09-28 MED ORDER — AMBRISENTAN 5 MG PO TABS
5.00 | ORAL_TABLET | ORAL | Status: DC
Start: 2019-09-29 — End: 2019-09-28

## 2019-09-28 MED ORDER — TRIAMCINOLONE ACETONIDE 0.1 % EX OINT
TOPICAL_OINTMENT | CUTANEOUS | Status: DC
Start: ? — End: 2019-09-28

## 2019-09-28 MED ORDER — TADALAFIL (PAH) 20 MG PO TABS
20.00 | ORAL_TABLET | ORAL | Status: DC
Start: 2019-09-29 — End: 2019-09-28

## 2019-09-28 MED ORDER — LIDOCAINE HCL 1 % IJ SOLN
0.50 | INTRAMUSCULAR | Status: DC
Start: ? — End: 2019-09-28

## 2019-09-28 MED ORDER — CALCIUM CARBONATE ANTACID 750 MG PO CHEW
CHEWABLE_TABLET | ORAL | Status: DC
Start: 2019-09-29 — End: 2019-09-28

## 2019-09-28 MED ORDER — LEVOTHYROXINE SODIUM 75 MCG PO TABS
75.00 | ORAL_TABLET | ORAL | Status: DC
Start: 2019-09-29 — End: 2019-09-28

## 2019-09-28 MED ORDER — ACETAMINOPHEN 325 MG PO TABS
650.00 | ORAL_TABLET | ORAL | Status: DC
Start: ? — End: 2019-09-28

## 2019-09-28 MED ORDER — FERROUS SULFATE 324 MG PO TBEC
324.00 | DELAYED_RELEASE_TABLET | ORAL | Status: DC
Start: 2019-09-30 — End: 2019-09-28

## 2019-09-28 MED ORDER — TORSEMIDE 20 MG PO TABS
80.00 | ORAL_TABLET | ORAL | Status: DC
Start: 2019-09-29 — End: 2019-09-28

## 2019-09-28 MED ORDER — MYCOPHENOLATE SODIUM 180 MG PO TBEC
720.00 | DELAYED_RELEASE_TABLET | ORAL | Status: DC
Start: 2019-09-28 — End: 2019-09-28

## 2019-09-28 MED ORDER — POLYETHYLENE GLYCOL 3350 17 GM/SCOOP PO POWD
17.00 | ORAL | Status: DC
Start: ? — End: 2019-09-28

## 2019-11-02 ENCOUNTER — Encounter: Payer: Self-pay | Admitting: Certified Nurse Midwife

## 2019-11-25 MED ORDER — FERROUS SULFATE 324 MG PO TBEC
324.00 | DELAYED_RELEASE_TABLET | ORAL | Status: DC
Start: 2019-11-27 — End: 2019-11-25

## 2019-11-25 MED ORDER — ALBUTEROL SULFATE (5 MG/ML) 0.5% IN NEBU
2.50 | INHALATION_SOLUTION | RESPIRATORY_TRACT | Status: DC
Start: ? — End: 2019-11-25

## 2019-11-25 MED ORDER — PANTOPRAZOLE SODIUM 40 MG PO TBEC
40.00 | DELAYED_RELEASE_TABLET | ORAL | Status: DC
Start: 2019-11-26 — End: 2019-11-25

## 2019-11-25 MED ORDER — CHOLECALCIFEROL 1.25 MG (50000 UT) PO CAPS
50000.00 | ORAL_CAPSULE | ORAL | Status: DC
Start: ? — End: 2019-11-25

## 2019-11-25 MED ORDER — TORSEMIDE 20 MG PO TABS
80.00 | ORAL_TABLET | ORAL | Status: DC
Start: 2019-11-25 — End: 2019-11-25

## 2019-11-25 MED ORDER — ASPIRIN 81 MG PO TBEC
81.00 | DELAYED_RELEASE_TABLET | ORAL | Status: DC
Start: 2019-11-26 — End: 2019-11-25

## 2019-11-25 MED ORDER — SODIUM CHLORIDE FLUSH 0.9 % IV SOLN
3.00 | INTRAVENOUS | Status: DC
Start: ? — End: 2019-11-25

## 2019-11-25 MED ORDER — MAGNESIUM OXIDE 400 MG PO TABS
400.00 | ORAL_TABLET | ORAL | Status: DC
Start: 2019-11-25 — End: 2019-11-25

## 2019-11-25 MED ORDER — LIDOCAINE HCL 1 % IJ SOLN
0.50 | INTRAMUSCULAR | Status: DC
Start: ? — End: 2019-11-25

## 2019-11-25 MED ORDER — VITAMINS/MINERALS PO TABS
1.00 | ORAL_TABLET | ORAL | Status: DC
Start: 2019-11-26 — End: 2019-11-25

## 2019-11-25 MED ORDER — MIDODRINE HCL 5 MG PO TABS
5.00 | ORAL_TABLET | ORAL | Status: DC
Start: 2019-11-25 — End: 2019-11-25

## 2019-11-25 MED ORDER — HEPARIN SODIUM (PORCINE) 5000 UNIT/ML IJ SOLN
5000.00 | INTRAMUSCULAR | Status: DC
Start: 2019-11-25 — End: 2019-11-25

## 2019-11-25 MED ORDER — ACETAMINOPHEN 325 MG PO TABS
650.00 | ORAL_TABLET | ORAL | Status: DC
Start: ? — End: 2019-11-25

## 2019-11-25 MED ORDER — SENNOSIDES-DOCUSATE SODIUM 8.6-50 MG PO TABS
1.00 | ORAL_TABLET | ORAL | Status: DC
Start: 2019-11-26 — End: 2019-11-25

## 2019-11-25 MED ORDER — MYCOPHENOLATE SODIUM 180 MG PO TBEC
720.00 | DELAYED_RELEASE_TABLET | ORAL | Status: DC
Start: 2019-11-25 — End: 2019-11-25

## 2019-11-25 MED ORDER — LEVOTHYROXINE SODIUM 75 MCG PO TABS
75.00 | ORAL_TABLET | ORAL | Status: DC
Start: 2019-11-26 — End: 2019-11-25

## 2019-11-25 MED ORDER — TRIAMCINOLONE ACETONIDE 0.1 % EX OINT
TOPICAL_OINTMENT | CUTANEOUS | Status: DC
Start: ? — End: 2019-11-25

## 2019-11-25 MED ORDER — GENERIC EXTERNAL MEDICATION
1.00 | Status: DC
Start: ? — End: 2019-11-25

## 2019-11-25 MED ORDER — POTASSIUM CHLORIDE CRYS ER 20 MEQ PO TBCR
20.00 | EXTENDED_RELEASE_TABLET | ORAL | Status: DC
Start: 2019-11-25 — End: 2019-11-25

## 2019-11-25 MED ORDER — MELATONIN 3 MG PO TABS
3.00 | ORAL_TABLET | ORAL | Status: DC
Start: ? — End: 2019-11-25

## 2019-12-08 ENCOUNTER — Other Ambulatory Visit: Payer: Self-pay | Admitting: "Endocrinology

## 2019-12-09 LAB — TSH: TSH: 3.67 mIU/L (ref 0.40–4.50)

## 2019-12-09 LAB — COMPLETE METABOLIC PANEL WITH GFR
AG Ratio: 1.3 (calc) (ref 1.0–2.5)
ALT: 4 U/L — ABNORMAL LOW (ref 6–29)
AST: 9 U/L — ABNORMAL LOW (ref 10–35)
Albumin: 4.1 g/dL (ref 3.6–5.1)
Alkaline phosphatase (APISO): 42 U/L (ref 37–153)
BUN/Creatinine Ratio: 18 (calc) (ref 6–22)
BUN: 31 mg/dL — ABNORMAL HIGH (ref 7–25)
CO2: 27 mmol/L (ref 20–32)
Calcium: 7.3 mg/dL — ABNORMAL LOW (ref 8.6–10.4)
Chloride: 100 mmol/L (ref 98–110)
Creat: 1.77 mg/dL — ABNORMAL HIGH (ref 0.50–1.05)
GFR, Est African American: 37 mL/min/{1.73_m2} — ABNORMAL LOW (ref >=60)
GFR, Est Non African American: 32 mL/min/{1.73_m2} — ABNORMAL LOW (ref >=60)
Globulin: 3.1 g/dL (calc) (ref 1.9–3.7)
Glucose, Bld: 85 mg/dL (ref 65–99)
Potassium: 4.1 mmol/L (ref 3.5–5.3)
Sodium: 142 mmol/L (ref 135–146)
Total Bilirubin: 0.4 mg/dL (ref 0.2–1.2)
Total Protein: 7.2 g/dL (ref 6.1–8.1)

## 2019-12-09 LAB — T4, FREE: Free T4: 1.2 ng/dL (ref 0.8–1.8)

## 2019-12-10 ENCOUNTER — Ambulatory Visit: Payer: 59 | Admitting: "Endocrinology

## 2019-12-10 ENCOUNTER — Encounter: Payer: Self-pay | Admitting: "Endocrinology

## 2019-12-10 ENCOUNTER — Other Ambulatory Visit: Payer: Self-pay

## 2019-12-10 DIAGNOSIS — E039 Hypothyroidism, unspecified: Secondary | ICD-10-CM

## 2019-12-10 DIAGNOSIS — E876 Hypokalemia: Secondary | ICD-10-CM

## 2019-12-10 DIAGNOSIS — E042 Nontoxic multinodular goiter: Secondary | ICD-10-CM

## 2019-12-10 DIAGNOSIS — E119 Type 2 diabetes mellitus without complications: Secondary | ICD-10-CM | POA: Diagnosis not present

## 2019-12-10 LAB — POCT GLYCOSYLATED HEMOGLOBIN (HGB A1C): Hemoglobin A1C: 5.3 % (ref 4.0–5.6)

## 2019-12-10 NOTE — Progress Notes (Signed)
12/10/2019      Endocrinology follow-up note                     Subjective:    Patient ID: Sabrina [name], female    DOB: 12/22/1960, PCP Fayrene Helper, MD   Past Medical History:  Diagnosis Date  . Acute on chronic congestive heart failure ([place])   . Acute on chronic diastolic (congestive) heart failure ([place]) 08/17/2019  . Arthritis   . Asthma   . Chronic bronchitis ([place])   . Endometriosis   . Essential hypertension   . Fibroid uterus   . GERD (gastroesophageal reflux disease)   . History of hiatal hernia   . History of nuclear stress test    a. Myoview 6/16: There is no resting or stress perfusion defect consistent with no prior infarct and no ischemia. EF 73 %  The patient was hypertensive prior and during the study. Cardiac cath 04/2016 showed normal coronary arteries  . Hx of iron deficiency anemia   . Hypersomnia with sleep apnea   . Interstitial lung disease ([place])   . Long Q-T syndrome    On beta-blocker  . Menorrhagia   . Obesity   . PFO (patent foramen ovale)   . Pneumonia   . Precordial pain   . Pulmonary arterial hypertension ([place])   . Right heart failure ([place])   . 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 Scleroderma ([place])   . Secondary hyperthyroidism   . Severe sepsis ([place]) 08/17/2019  . Tinnitus    Left  . Tubular adenoma of colon   . Type 2 diabetes mellitus ([place])   . Vertigo    Past Surgical History:  Procedure Laterality Date  . ABDOMINAL HYSTERECTOMY  2004   supracervical hysterectomy  . BREAST SURGERY  2005   reduction  . BREATH TEK H PYLORI [name] 12/24/2014   Procedure: BREATH TEK H PYLORI;  Surgeon: Greer Pickerel, MD;  Location: [facility];  Service: General;  Laterality: [name];  . CARDIAC CATHETERIZATION [name] 03/22/2016   Procedure: Right/Left Heart Cath and Coronary Angiography;  Surgeon: Troy Sine, MD;  Location: [facility];  Service: Cardiovascular;  Laterality: [name];  . DILATION AND CURETTAGE OF UTERUS     age  59  . ESOPHAGOGASTRODUODENOSCOPY (EGD) WITH PROPOFOL [name] 10/14/2015   Procedure: ESOPHAGOGASTRODUODENOSCOPY (EGD) WITH PROPOFOL;  Surgeon: Clarene Essex, MD;  Location: [facility];  Service: Endoscopy;  Laterality: [name];  . LAPAROSCOPIC GASTRIC SLEEVE RESECTION WITH HIATAL HERNIA REPAIR [name] 07/05/2015   Procedure: LAPAROSCOPIC GASTRIC SLEEVE RESECTION WITH HIATAL HERNIA REPAIR, upper endoscopy;  Surgeon: Greer Pickerel, MD;  Location: [facility];  Service: General;  Laterality: [name];  . REDUCTION MAMMAPLASTY Bilateral   . RIGHT HEART CATH [name] 10/13/2018   Procedure: RIGHT HEART CATH;  Surgeon: Larey Dresser, MD;  Location: [facility];  Service: Cardiovascular;  Laterality: [name];  . RIGHT HEART CATH [name] 08/19/2019   Procedure: RIGHT HEART CATH;  Surgeon: Larey Dresser, MD;  Location: [facility];  Service: Cardiovascular;  Laterality: [name];   Social History   Socioeconomic History  . Marital status: Married    Spouse name: Not on file  . Number of children: Not on file  . Years of education: 9  . Highest education level: Not on file  Occupational History  . Occupation: Therapist, music: [employer]  Tobacco Use  . Smoking status: Never Smoker  .  Smokeless tobacco: Never Used  Substance and Sexual Activity  . Alcohol use: No    Alcohol/week: 0.0 standard drinks  . Drug use: No  . Sexual activity: Yes    Partners: Male    Birth control/protection: Surgical    Comment: hysterectomy (supracervical)  Other Topics Concern  . Not on file  Social History Narrative   Regular exercise-no   Caffeine Use-no      [facility]: Patient lives with her husband. Works for [employer]. No bird or mold exposure. No pets currently.         Social Determinants of Health   Financial Resource Strain:   . Difficulty of Paying Living Expenses:   Food Insecurity:   . Worried About Charity fundraiser in the Last Year:   . Arboriculturist in the  Last Year:   Transportation Needs:   . Film/video editor (Medical):   [name] Lack of Transportation (Non-Medical):   Physical Activity:   . Days of Exercise per Week:   . Minutes of Exercise per Session:   Stress:   . Feeling of Stress :   Social Connections:   . Frequency of Communication with Friends and Family:   . Frequency of Social Gatherings with Friends and Family:   . Attends Religious Services:   . Active Member of Clubs or Organizations:   . Attends Archivist Meetings:   [name] Marital Status:    Outpatient Encounter Medications as of 12/10/2019  Medication Sig  . epoprostenol (VELETRI) 1.5 MG injection Inject into the vein.  [name] acetaminophen (TYLENOL) 325 MG tablet Take 2 tablets (650 mg total) by mouth every 6 (six) hours as needed for mild pain (or Fever >/= 101).  [name] aspirin EC 81 MG tablet Take 1 tablet (81 mg total) by mouth daily with breakfast.  . calcium carbonate (OS-CAL - DOSED IN MG OF ELEMENTAL CALCIUM) 1250 (500 Ca) MG tablet Take 3 tablets (1,500 mg of elemental calcium total) by mouth 3 (three) times daily with meals.  [name] ceFEPIme 2 g in sodium chloride 0.9 % 100 mL Inject 2 g into the vein every 12 (twelve) hours.  [name] levothyroxine (SYNTHROID) 75 MCG tablet TAKE 1 TABLET (75 MCG TOTAL) BY MOUTH DAILY BEFORE BREAKFAST.  . magnesium oxide (MAG-OX) 400 MG tablet Take 0.5 tablets (200 mg total) by mouth daily with breakfast.  . Magnesium Oxide 250 MG TABS Take 1 tablet by mouth daily with breakfast.  . midodrine (PROAMATINE) 5 MG tablet Take 3 tablets (15 mg total) by mouth 3 (three) times daily with meals.  . milrinone (PRIMACOR) 20 MG/100 ML SOLN infusion Inject 0.0233 mg/min into the vein continuous.  . Multiple Vitamin (MULTIVITAMIN WITH MINERALS) TABS tablet Take 1 tablet by mouth daily.  . mycophenolate (MYFORTIC) 360 MG TBEC EC tablet Take 720 mg by mouth 2 (two) times daily.   . pantoprazole (PROTONIX) 20 MG tablet TAKE 1 TABLET (20 MG) BY MOUTH EACH  MORNING, MAY TAKE AN ADDITIONAL DOSE IN THE EVENING IF NEEDED FOR HEARTBURN/INDIGESTION.  [name] pantoprazole (PROTONIX) 20 MG tablet Take 1 tablet (20 mg total) by mouth daily.  [name] senna-docusate (SENOKOT-S) 8.6-50 MG tablet Take 2 tablets by mouth at bedtime.  . vancomycin (VANCOREADY) 1250 MG/250ML SOLN Inject 250 mLs (1,250 mg total) into the vein daily.  . Vitamin D, Ergocalciferol, (DRISDOL) 1.25 MG (50000 UT) CAPS capsule Take 1 capsule (50,000 Units total) by mouth every Thursday. (Patient taking differently: Take 50,000  Units by mouth every 7 (seven) days. )  . [DISCONTINUED] ambrisentan (LETAIRIS) 5 MG tablet Take 1 tablet (5 mg total) by mouth daily.  . [DISCONTINUED] heparin 5000 UNIT/ML injection Inject 1 mL (5,000 Units total) into the skin every 8 (eight) hours.  . [DISCONTINUED] potassium chloride (K-DUR) 10 MEQ tablet Take 2 tablets by mouth twice daily  . [DISCONTINUED] Selexipag (UPTRAVI) 1200 MCG TABS Take 1,200 mcg by mouth every 12 (twelve) hours.  . [DISCONTINUED] temazepam (RESTORIL) 15 MG capsule Take 1 capsule (15 mg total) by mouth at bedtime as needed for sleep.   No facility-administered encounter medications on file as of 12/10/2019.   ALLERGIES: Allergies  Allergen Reactions  . Peanut Oil Swelling  . Potassium Iodide Other (See Comments)    facial swelling, also eyes, and ears  . Ace Inhibitors Cough  . Doxycycline Hives  . Peanut-Containing Drug Products Nausea And Vomiting  . Amlodipine Rash  . Codeine Nausea And Vomiting  . Latex Hives and Rash  . Penicillins Hives, Itching and Rash    .Marland KitchenHas patient had a PCN reaction causing immediate rash, facial/tongue/throat swelling, SOB or lightheadedness with hypotension: No Has patient had a PCN reaction causing severe rash involving mucus membranes or skin necrosis: No Has patient had a PCN reaction that required hospitalization No Has patient had a PCN reaction occurring within the last 10 years: YES (3 years ago)   If all of the above answers are "NO", then may proceed with Cephalosporin use.     VACCINATION STATUS: Immunization History  Administered Date(s) Administered  . Influenza Split 06/22/2011, 05/13/2018  . Influenza Whole 07/11/2006  . Influenza,inj,Quad PF,6+ Mos 06/26/2013, 05/11/2014, 06/13/2015, 05/20/2017  . Influenza-Unspecified 06/26/2013, 05/11/2014, 06/13/2015, 06/06/2016, 05/20/2017  . Pneumococcal Conjugate-13 02/07/2015  . Pneumococcal Polysaccharide-23 06/26/2013  . Td 04/11/2004  . Tdap 05/09/2015    HPI   59 year old female patient with medical history as above. -She is being seen in follow up for hypocalcemia, hypothyroidism, hypokalemia.   She is on multiple supplements for electrolytes and thyroid hormone. 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  She remains  on a regular supplement with calcium, magnesium, and vitamin D.    Although she had a series of hospitalizations for electrolyte abnormalities in the past, she did not have to go to ER since last visit.    Her previsit labs show calcium improving to 7.3  She is on calcium carbonate 1250 mg 3 tablets p.o. 3 times daily AC.  She is also on potassium supplement for hypokalemia as well as magnesium supplement for hypomagnesemia.  She does not report any new symptoms today.  -The exact etiology for her multiple electrolyte abnormalities is not settled.  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   -She has history of multinodular goiter with biopsy of one of her nodules consistent with hyperblastic nodule.  Repeat thyroid ultrasound on March 13, 2017 was remarkable for another left lobe nodule 1.5 cm with benign features. She did not show up for her appointment for surveillance thyroid ultrasound.   -She is on levothyroxine 75 mcg for hypothyroidism, reports compliance with this medication -She denies dysphagia, shortness of breath, voice change. -  She has a history of prolonged QT interval.  She has no new complaints today.  Review of Systems  Limited as above.  Objective:    BP 114/79   Pulse 87   Ht 5' (1.524 m)   Wt 189  lb 6.4 oz (85.9 kg)   LMP 10/31/2002   BMI 36.99 kg/m   Wt Readings from Last 3 Encounters:  12/10/19 189 lb 6.4 oz (85.9 kg)  09/04/19 190 lb 1.9 oz (86.2 kg)  08/20/19 203 lb 7.8 oz (92.3 kg)      Physical Exam- Limited  Constitutional:  Body mass index is 36.99 kg/m. , not in acute distress, normal state of mind Eyes:  EOMI, no exophthalmos Neck: Supple Thyroid: + Palpable gross goiter.   Respiratory: Adequate breathing efforts Musculoskeletal: no gross deformities, strength intact in all four extremities, no gross restriction of joint movements Skin:  + Scattered, tight, scaly skin patient on torso and upper extremity, neck.   Neurological: no tremor with outstretched hands,   CMP     Component Value Date/Time   NA 142 12/08/2019 0723   NA 142 09/30/2018 1409   K 4.1 12/08/2019 0723   CL 100 12/08/2019 0723   CO2 27 12/08/2019 0723   GLUCOSE 85 12/08/2019 0723   BUN 31 (H) 12/08/2019 0723   BUN 23 09/30/2018 1409   CREATININE 1.77 (H) 12/08/2019 0723   CALCIUM 7.3 (L) 12/08/2019 0723   CALCIUM 7.8 (L) 12/02/2008 2239   PROT 7.2 12/08/2019 0723   ALBUMIN 3.6 08/17/2019 0421   AST 9 (L) 12/08/2019 0723   ALT 4 (L) 12/08/2019 0723   ALKPHOS 82 08/17/2019 0421   BILITOT 0.4 12/08/2019 0723   GFRNONAA 32 (L) 12/08/2019 0723   GFRAA 37 (L) 12/08/2019 0723     Diabetic Labs (most recent): Lab Results  Component Value Date   HGBA1C 5.3 12/10/2019   HGBA1C 6.2 (H) 04/22/2019   HGBA1C 5.7 (H) 12/23/2017     Lipid Panel ( most recent) Lipid Panel     Component Value Date/Time   CHOL 166 12/23/2017 0738   TRIG 109 12/23/2017 0738   HDL 35 (L) 12/23/2017 0738   CHOLHDL 4.7 12/23/2017 0738   VLDL 18 12/11/2016 0743   LDLCALC 109 (H) 12/23/2017 0738     Assessment & Plan:    1. 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  Not due to hypoparathyroidism.  She has elevated PTH appropriately in response to hypocalcemia.  -She has maintained low normal calcium, currently 7.3 improving from 6.4, asymptomatic.   -Her previsit labs show stable potassium of 4.1. 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  -She is going to continue multiple electrolyte supplements at high dose.  She is advised to continue calcium carbonate 1250 mg (500 mg elemental calcium), 3  tablets 3 times a day with meals.  This will supplement 4500 mg of elemental calcium daily. -She is advised to continue magnesium oxide 250 mg p.o. daily with breakfast. She is advised to continue potassium supplement with 40 mEq daily, her potassium currently at 4.1.    Ower her potassium supplement to 20 mg twice a day, her current potassium is 4.6.   -She is advised to call clinic if she develops symptoms of hypocalcemia including cramps, tingling,.   2. hypothyroidism -Her recent thyroid function tests are consistent with appropriate replacement.   She is advised to continue levothyroxine 25 mg p.o. daily before breakfast.     - We discussed about the correct intake of her thyroid hormone, on empty stomach at fasting, with water, separated by at least 30 minutes from breakfast and other medications,  and separated by more than 4 hours from calcium, iron, multivitamins,  acid reflux medications (PPIs). -Patient is made aware of the fact that thyroid hormone replacement is needed for life, dose to be adjusted by periodic monitoring of thyroid function tests.   3.  Nodular goiter -Her thyroid ultrasound from August 2018 was remarkable for stable isthmus nodule at 1.8 cm and new 1.5 cm nodule on the left lobe with benign and non- suspicious features.  She missed her appointment for  surveillance thyroid/neck  ultrasound.  She will be reconsidered for surveillance thyroid ultrasound after her next visit. -She we will need surveillance thyroid ultrasound before her next visit.      4.  Type 2 diabetes-controlled with point-of-care A1c of 5.3% today.   Not on medications currently.    - I advised patient to maintain close follow up with Fayrene Helper, MD for primary care needs.   - Time spent on this patient care encounter:  35 min, of which >50% was spent in  counseling and the rest reviewing her  current and  previous labs/studies ( including abstraction from other facilities),  previous treatments, her blood glucose readings, and medications' doses and developing a plan for long-term care based on the latest recommendations for standards of care; and documenting her care.  Sabrina [name] participated in the discussions, expressed understanding, and voiced agreement with the above plans.  All questions were answered to her satisfaction. she is encouraged to contact clinic should she have any questions or concerns prior to her return visit.   Follow up plan: Return in about 3 months (around 03/10/2020) for Follow up with Pre-visit Labs, Thyroid / Neck Ultrasound.  Glade Lloyd, MD Phone: [phone]  Fax: [phone]   12/10/2019, 1:23 PM

## 2019-12-10 NOTE — Patient Instructions (Signed)

## 2019-12-28 ENCOUNTER — Ambulatory Visit: Payer: 59 | Admitting: Family Medicine

## 2020-01-13 ENCOUNTER — Encounter: Payer: Self-pay | Admitting: "Endocrinology

## 2020-02-17 ENCOUNTER — Other Ambulatory Visit: Payer: Self-pay | Admitting: "Endocrinology

## 2020-02-29 ENCOUNTER — Other Ambulatory Visit: Payer: Self-pay

## 2020-02-29 ENCOUNTER — Ambulatory Visit: Payer: 59

## 2020-02-29 LAB — HM DIABETES EYE EXAM

## 2020-03-02 ENCOUNTER — Ambulatory Visit: Payer: 59 | Admitting: Family Medicine

## 2020-03-02 ENCOUNTER — Encounter: Payer: Self-pay | Admitting: Family Medicine

## 2020-03-02 ENCOUNTER — Other Ambulatory Visit: Payer: Self-pay

## 2020-03-02 DIAGNOSIS — I5032 Chronic diastolic (congestive) heart failure: Secondary | ICD-10-CM | POA: Diagnosis not present

## 2020-03-02 DIAGNOSIS — Z1231 Encounter for screening mammogram for malignant neoplasm of breast: Secondary | ICD-10-CM | POA: Diagnosis not present

## 2020-03-02 DIAGNOSIS — N183 Chronic kidney disease, stage 3 unspecified: Secondary | ICD-10-CM

## 2020-03-02 DIAGNOSIS — Z131 Encounter for screening for diabetes mellitus: Secondary | ICD-10-CM

## 2020-03-02 NOTE — Progress Notes (Signed)
Subjective:  Patient ID: Sabrina [name], female    DOB: [birth year]  Age: 59 y.o. MRN: [id number]  CC:  Chief Complaint  Patient presents with  . Follow-up    5 month follow up no other issues at this moment       HPI  HPI Ms. Pilling is a 59 year old female patient of Dr. Griffin Dakin. She presents today for 98-month follow-up. I last saw her for transition of care visit about 6 months back. Today she reports taking all the medication as directed and without any issue. However she reports some excessive fatigue but inability to sleep for longer than 4 hours at a time. Does have heart failure and some the medications that she is on make her feel like she is moving through with it clogged. She does have a taste different secondary to some of her medications but overall is eating well. Denies have any changes in bowel or bladder habits. Denies having any blood in urine or stool. Denies having any memory issues. Denies having any falls or injuries. Denies having any skin issues. Denies having any vision changes. She denies having any chest pain, cough, headache, dizziness, fevers or chills. Does report some shortness of breath which she had to keep that time secondary to her history.  She is in need of an updated microalbumin, and mammogram.  Today patient denies signs and symptoms of COVID 19 infection including fever, chills, cough, shortness of breath, and headache. Past Medical, Surgical, Social History, Allergies, and Medications have been Reviewed.   Past Medical History:  Diagnosis Date  . Acute on chronic congestive heart failure ([place])   . Acute on chronic diastolic (congestive) heart failure ([place]) 08/17/2019  . Arthritis   . Asthma   . Chronic bronchitis ([place])   . Encounter for long-term (current) use of other medications 01/14/2015  . Endometriosis   . Essential hypertension   . Fibroid uterus   . GERD (gastroesophageal reflux disease)   . History of hiatal hernia   .  History of nuclear stress test    a. Myoview 6/16: There is no resting or stress perfusion defect consistent with no prior infarct and no ischemia. EF 73 %  The patient was hypertensive prior and during the study. Cardiac cath 04/2016 showed normal coronary arteries  . Hx of iron deficiency anemia   . Hypersomnia with sleep apnea   . Hypocalcemia 07/07/2016  . Hypomagnesemia 06/05/2019  . Hypotension   . Interstitial lung disease ([place])   . Iron deficiency anemia 09/11/2014   Overview:  Last Assessment & Plan:  Had colonoscopy in 07/2013. Needs to ensure she is taking iron 325mg  twice daily, will f/u in 4 months, if still low needs Heme consult as may need IV iron, she has a hysterectomy  . Ischemia of digits of hand 05/17/2018  . Long Q-T syndrome    On beta-blocker  . Menorrhagia   . Obesity   . PFO (patent foramen ovale)   . Pneumonia   . Precordial pain   . Pulmonary arterial hypertension ([place])   . Right heart failure ([place])   . 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 Scleroderma ([place])   . Seasonal allergies 01/05/2015   Overview:  Last Assessment & Plan:  Needs to commit to daily use of appropriate medication, astelin and nasonex prescribed and singular also  . Secondary hyperthyroidism   . Severe sepsis ([place]) 08/17/2019  . SIRS (systemic inflammatory response syndrome) ([facility])   .  Tinnitus    Left  . Tubular adenoma of colon   . Type 2 diabetes mellitus ([place])   . Upper airway cough syndrome 02/04/2018  . Vertigo     Current Meds  Medication Sig  . acetaminophen (TYLENOL) 325 MG tablet Take 2 tablets (650 mg total) by mouth every 6 (six) hours as needed for mild pain (or Fever >/= 101).  [name] aspirin EC 81 MG tablet Take 1 tablet (81 mg total) by mouth daily with breakfast.  . calcium carbonate (OS-CAL - DOSED IN MG OF ELEMENTAL CALCIUM) 1250 (500 Ca) MG tablet Take 3 tablets (1,500 mg of elemental calcium total) by mouth 3 (three) times daily with meals.  [name] ceFEPIme 2 g in  sodium chloride 0.9 % 100 mL Inject 2 g into the vein every 12 (twelve) hours.  [name] epoprostenol (VELETRI) 1.5 MG injection Inject into the vein.  [name] levothyroxine (SYNTHROID) 75 MCG tablet TAKE 1 TABLET (75 MCG TOTAL) BY MOUTH DAILY BEFORE BREAKFAST.  . magnesium oxide (MAG-OX) 400 MG tablet Take 0.5 tablets (200 mg total) by mouth daily with breakfast.  . Magnesium Oxide 250 MG TABS Take 1 tablet by mouth daily with breakfast.  . midodrine (PROAMATINE) 5 MG tablet Take 3 tablets (15 mg total) by mouth 3 (three) times daily with meals.  . milrinone (PRIMACOR) 20 MG/100 ML SOLN infusion Inject 0.0233 mg/min into the vein continuous.  . Multiple Vitamin (MULTIVITAMIN WITH MINERALS) TABS tablet Take 1 tablet by mouth daily.  . mycophenolate (MYFORTIC) 360 MG TBEC EC tablet Take 720 mg by mouth 2 (two) times daily.   . pantoprazole (PROTONIX) 20 MG tablet Take 1 tablet (20 mg total) by mouth daily.  . pantoprazole (PROTONIX) 20 MG tablet 1 TABLET EACH MORNING, MAY TAKE ADDITIONAL DOSE IN THE EVENING IF NEEDED FOR HEARTBURN/INDIGESTION.  [name] senna-docusate (SENOKOT-S) 8.6-50 MG tablet Take 2 tablets by mouth at bedtime.  . vancomycin (VANCOREADY) 1250 MG/250ML SOLN Inject 250 mLs (1,250 mg total) into the vein daily.  . Vitamin D, Ergocalciferol, (DRISDOL) 1.25 MG (50000 UT) CAPS capsule Take 1 capsule (50,000 Units total) by mouth every Thursday. (Patient taking differently: Take 50,000 Units by mouth every 7 (seven) days. )    ROS:  Review of Systems  Constitutional: Negative.   HENT: Negative.   Eyes: Negative.   Respiratory: Negative.   Cardiovascular: Negative.   Gastrointestinal: Negative.   Genitourinary: Negative.   Musculoskeletal: Negative.   Skin: Negative.   Neurological: Negative.   Endo/Heme/Allergies: Negative.   Psychiatric/Behavioral: Negative.        Sleep trouble   All other systems reviewed and are negative.    Objective:   Today's Vitals: BP 123/74 (BP Location: Right  Arm, Patient Position: Sitting, Cuff Size: Normal)   Pulse 81   Temp (!) 97.2 F (36.2 C) (Temporal)   Resp 18   Ht 5' (1.524 m)   Wt 186 lb 1.9 oz (84.4 kg)   LMP 10/31/2002   SpO2 93%   BMI 36.35 kg/m  Vitals with BMI 03/02/2020 12/10/2019 09/04/2019  Height 5\' 0"  5\' 0"  5\' 0"   Weight 186 lbs 2 oz 189 lbs 6 oz 190 lbs 2 oz  BMI 36.35 75.64 33.29  Systolic 518 841 660  Diastolic 74 79 60  Pulse 81 87 98     Physical Exam Vitals and nursing note reviewed.  Constitutional:      Appearance: Normal appearance. She is well-developed and well-groomed. She is obese.  HENT:  Head: Normocephalic and atraumatic.     Right Ear: External ear normal.     Left Ear: External ear normal.     Nose: Nose normal.     Mouth/Throat:     Mouth: Mucous membranes are moist.     Pharynx: Oropharynx is clear.  Eyes:     General:        Right eye: No discharge.        Left eye: No discharge.     Conjunctiva/sclera: Conjunctivae normal.  Cardiovascular:     Rate and Rhythm: Normal rate and regular rhythm.     Pulses: Normal pulses.     Heart sounds: Murmur heard.   Pulmonary:     Effort: Pulmonary effort is normal.     Breath sounds: Normal breath sounds.  Musculoskeletal:        General: Normal range of motion.     Cervical back: Normal range of motion and neck supple.  Skin:    General: Skin is warm.  Neurological:     General: No focal deficit present.     Mental Status: She is alert and oriented to person, place, and time.  Psychiatric:        Attention and Perception: Attention normal.        Mood and Affect: Mood normal.        Speech: Speech normal.        Behavior: Behavior normal. Behavior is cooperative.        Thought Content: Thought content normal.        Cognition and Memory: Cognition normal.        Judgment: Judgment normal.      Assessment   1. Morbid obesity ([place])   2. Chronic diastolic heart failure ([facility])   3. Stage 3 chronic kidney disease, unspecified  whether stage 3a or 3b CKD   4. Screening mammogram, encounter for   5. Screening for diabetes mellitus (DM)     Tests ordered Orders Placed This Encounter  Procedures  . MM Digital Screening  . Microalbumin, urine     Plan: Please see assessment and plan per problem list above.   No orders of the defined types were placed in this encounter.   Patient to follow-up in Annual   Perlie Mayo, NP

## 2020-03-02 NOTE — Assessment & Plan Note (Signed)
She is continuing to follow up with [facility], she is taking all medications as directed. Reports excessive fatigue at times. But inability to sleep at times.

## 2020-03-02 NOTE — Assessment & Plan Note (Signed)
Has been recently in the past for her to nephrology. Will monitor with updated labs at next visit.

## 2020-03-02 NOTE — Assessment & Plan Note (Signed)
Obesity is linked to hypertension, heart failure, hyperlipidemia Improved  Sabrina [name] is educated about the importance of exercise daily to help with weight management. A minumum of 30 minutes daily is recommended. Additionally, importance of healthy food choices  with portion control discussed.   Wt Readings from Last 3 Encounters:  03/02/20 186 lb 1.9 oz (84.4 kg)  12/10/19 189 lb 6.4 oz (85.9 kg)  09/04/19 190 lb 1.9 oz (86.2 kg)

## 2020-03-02 NOTE — Patient Instructions (Addendum)
I appreciate the opportunity to provide you with care for your health and wellness. Today we discussed: overall health   Follow up: needs annual appt with Dr Moshe Cipro  No labs or referrals today Orders: Mammogram  I hope you can start sleeping better. I have attached some information that might be helpful  Please continue to practice social distancing to keep you, your family, and our community safe.  If you must go out, please wear a mask and practice good handwashing.  It was a pleasure to see you and I look forward to continuing to work together on your health and well-being. Please do not hesitate to call the office if you need care or have questions about your care.  Have a wonderful day and week. With Gratitude, Cherly Beach, DNP, AGNP-BC  Teens need about 9 hours of sleep a night. Younger children need more sleep (10-11 hours a night) and adults need slightly less (7-9 hours each night). 11 Tips to Follow: 1. No caffeine after 3pm: Avoid beverages with caffeine (soda, tea, energy drinks, etc.) especially after 3pm.  2. Don't go to bed hungry: Have your evening meal at least 3 hrs. before going to sleep. It's fine to have a small bedtime snack such as a glass of milk and a few crackers but don't have a big meal.  3. Have a nightly routine before bed: Plan on "winding down" before you go to sleep. Begin relaxing about 1 hour before you go to bed. Try doing a quiet activity such as listening to calming music, reading a book or meditating.  4. Turn off the TV and ALL electronics including video games, tablets, laptops, etc. 1 hour before sleep, and keep them out of the bedroom.  5. Turn off your cell phone and all notifications (new email and text alerts) or even better, leave your phone outside your room while you sleep. Studies have shown that a part of your brain continues to respond to certain lights and sounds even while you're still asleep.  6. Make your bedroom quiet, dark  and cool. If you can't control the noise, try wearing earplugs or using a fan to block out other sounds.  7. Practice relaxation techniques. Try reading a book or meditating or drain your brain by writing a list of what you need to do the next day.  8. Don't nap unless you feel sick: you'll have a better night's sleep.  9. Don't smoke, or quit if you do. Nicotine, alcohol, and marijuana can all keep you awake. Talk to your health care provider if you need help with substance use.  10. Most importantly, wake up at the same time every day (or within 1 hour of your usual wake up time) EVEN on the weekends. A regular wake up time promotes sleep hygiene and prevents sleep problems.  11. Reduce exposure to bright light in the last three hours of the day before going to sleep.  Maintaining good sleep hygiene and having good sleep habits lower your risk of developing sleep problems. Getting better sleep can also improve your concentration and alertness. Try the simple steps in this guide. If you still have trouble getting enough rest, make an appointment with your health care provider.

## 2020-03-03 ENCOUNTER — Other Ambulatory Visit (HOSPITAL_COMMUNITY)
Admission: RE | Admit: 2020-03-03 | Discharge: 2020-03-03 | Disposition: A | Discharge status: Home / Self Care | Payer: 59 | Source: Other Acute Inpatient Hospital | Attending: Family Medicine | Admitting: Family Medicine

## 2020-03-03 DIAGNOSIS — Z131 Encounter for screening for diabetes mellitus: Secondary | ICD-10-CM | POA: Diagnosis not present

## 2020-03-04 ENCOUNTER — Ambulatory Visit: Payer: 59 | Admitting: Certified Nurse Midwife

## 2020-03-04 LAB — MICROALBUMIN, URINE: Microalb, Ur: 9.7 ug/mL — ABNORMAL HIGH

## 2020-03-07 ENCOUNTER — Ambulatory Visit (HOSPITAL_COMMUNITY)
Admission: RE | Admit: 2020-03-07 | Discharge: 2020-03-07 | Disposition: A | Discharge status: Home / Self Care | Payer: 59 | Source: Ambulatory Visit | Attending: "Endocrinology | Admitting: "Endocrinology

## 2020-03-07 ENCOUNTER — Other Ambulatory Visit: Payer: Self-pay

## 2020-03-07 DIAGNOSIS — E042 Nontoxic multinodular goiter: Secondary | ICD-10-CM | POA: Insufficient documentation

## 2020-03-10 ENCOUNTER — Other Ambulatory Visit: Payer: Self-pay | Admitting: "Endocrinology

## 2020-03-10 NOTE — Telephone Encounter (Signed)
Want to clarify which dose of levothyroxine you want pt taking. In her med list shows rx for levothyroxine 97mcg but in her last note states 78mcg.

## 2020-03-12 LAB — COMPLETE METABOLIC PANEL WITH GFR
AG Ratio: 1.3 (calc) (ref 1.0–2.5)
ALT: 4 U/L — ABNORMAL LOW (ref 6–29)
AST: 10 U/L (ref 10–35)
Albumin: 4.3 g/dL (ref 3.6–5.1)
Alkaline phosphatase (APISO): 45 U/L (ref 37–153)
BUN/Creatinine Ratio: 14 (calc) (ref 6–22)
BUN: 20 mg/dL (ref 7–25)
CO2: 25 mmol/L (ref 20–32)
Calcium: 7.4 mg/dL — ABNORMAL LOW (ref 8.6–10.4)
Chloride: 102 mmol/L (ref 98–110)
Creat: 1.4 mg/dL — ABNORMAL HIGH (ref 0.50–1.05)
GFR, Est African American: 49 mL/min/{1.73_m2} — ABNORMAL LOW (ref >=60)
GFR, Est Non African American: 42 mL/min/{1.73_m2} — ABNORMAL LOW (ref >=60)
Globulin: 3.3 g/dL (calc) (ref 1.9–3.7)
Glucose, Bld: 83 mg/dL (ref 65–99)
Potassium: 3.7 mmol/L (ref 3.5–5.3)
Sodium: 141 mmol/L (ref 135–146)
Total Bilirubin: 0.4 mg/dL (ref 0.2–1.2)
Total Protein: 7.6 g/dL (ref 6.1–8.1)

## 2020-03-12 LAB — TSH: TSH: 2.96 mIU/L (ref 0.40–4.50)

## 2020-03-12 LAB — VITAMIN D 25 HYDROXY (VIT D DEFICIENCY, FRACTURES): Vit D, 25-Hydroxy: 94 ng/mL (ref 30–100)

## 2020-03-12 LAB — T4, FREE: Free T4: 1.1 ng/dL (ref 0.8–1.8)

## 2020-03-15 ENCOUNTER — Other Ambulatory Visit: Payer: Self-pay

## 2020-03-15 ENCOUNTER — Encounter: Payer: Self-pay | Admitting: "Endocrinology

## 2020-03-15 ENCOUNTER — Ambulatory Visit: Payer: 59 | Admitting: "Endocrinology

## 2020-03-15 DIAGNOSIS — E119 Type 2 diabetes mellitus without complications: Secondary | ICD-10-CM

## 2020-03-15 DIAGNOSIS — E039 Hypothyroidism, unspecified: Secondary | ICD-10-CM

## 2020-03-15 DIAGNOSIS — E876 Hypokalemia: Secondary | ICD-10-CM | POA: Diagnosis not present

## 2020-03-15 DIAGNOSIS — E042 Nontoxic multinodular goiter: Secondary | ICD-10-CM

## 2020-03-15 MED ORDER — LEVOTHYROXINE SODIUM 75 MCG PO TABS: 75.0000 ug | ORAL_TABLET | Freq: Every day | ORAL | 1 refills | Status: DC

## 2020-03-15 NOTE — Progress Notes (Signed)
03/15/2020     Endocrinology follow-up note                    Subjective:    Patient ID: Sabrina [name], female    DOB: [birth year], PCP Fayrene Helper, MD   Past Medical History:  Diagnosis Date  . Acute on chronic congestive heart failure ([place])   . Acute on chronic diastolic (congestive) heart failure ([place]) 08/17/2019  . Arthritis   . Asthma   . Chronic bronchitis ([place])   . Encounter for long-term (current) use of other medications 01/14/2015  . Endometriosis   . Essential hypertension   . Fibroid uterus   . GERD (gastroesophageal reflux disease)   . History of hiatal hernia   . History of nuclear stress test    a. Myoview 6/16: There is no resting or stress perfusion defect consistent with no prior infarct and no ischemia. EF 73 %  The patient was hypertensive prior and during the study. Cardiac cath 04/2016 showed normal coronary arteries  . Hx of iron deficiency anemia   . Hypersomnia with sleep apnea   . Hypocalcemia 07/07/2016  . Hypomagnesemia 06/05/2019  . Hypotension   . Interstitial lung disease ([place])   . Iron deficiency anemia 09/11/2014   Overview:  Last Assessment & Plan:  Had colonoscopy in 07/2013. Needs to ensure she is taking iron 325mg  twice daily, will f/u in 4 months, if still low needs Heme consult as may need IV iron, she has a hysterectomy  . Ischemia of digits of hand 05/17/2018  . Long Q-T syndrome    On beta-blocker  . Menorrhagia   . Obesity   . PFO (patent foramen ovale)   . Pneumonia   . Precordial pain   . Pulmonary arterial hypertension ([place])   . 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 Scleroderma ([place])   . Seasonal allergies 01/05/2015   Overview:  Last Assessment & Plan:  Needs to commit to daily use of appropriate medication, astelin and nasonex prescribed and singular also  . Secondary hyperthyroidism   . Severe sepsis ([place]) 08/17/2019  . SIRS (systemic inflammatory response syndrome) ([facility])    . Tinnitus    Left  . Tubular adenoma of colon   . Type 2 diabetes mellitus ([place])   . Upper airway cough syndrome 02/04/2018  . Vertigo    Past Surgical History:  Procedure Laterality Date  . ABDOMINAL HYSTERECTOMY  2004   supracervical hysterectomy  . BREAST SURGERY  2005   reduction  . BREATH TEK H PYLORI [name] 12/24/2014   Procedure: BREATH TEK H PYLORI;  Surgeon: Greer Pickerel, MD;  Location: [facility];  Service: General;  Laterality: [name];  . CARDIAC CATHETERIZATION [name] 03/22/2016   Procedure: Right/Left Heart Cath and Coronary Angiography;  Surgeon: Troy Sine, MD;  Location: [facility];  Service: Cardiovascular;  Laterality: [name];  . DILATION AND CURETTAGE OF UTERUS     age 59  . ESOPHAGOGASTRODUODENOSCOPY (EGD) WITH PROPOFOL [name] 10/14/2015   Procedure: ESOPHAGOGASTRODUODENOSCOPY (EGD) WITH PROPOFOL;  Surgeon: Clarene Essex, MD;  Location: [facility];  Service: Endoscopy;  Laterality: [name];  . LAPAROSCOPIC GASTRIC SLEEVE RESECTION WITH HIATAL HERNIA REPAIR [name] 07/05/2015   Procedure: LAPAROSCOPIC GASTRIC SLEEVE RESECTION WITH HIATAL HERNIA REPAIR, upper endoscopy;  Surgeon: Greer Pickerel, MD;  Location: [facility];  Service: General;  Laterality: [name];  . REDUCTION MAMMAPLASTY Bilateral   . RIGHT HEART CATH [name] 10/13/2018  Procedure: RIGHT HEART CATH;  Surgeon: Larey Dresser, MD;  Location: [facility];  Service: Cardiovascular;  Laterality: [name];  . RIGHT HEART CATH [name] 08/19/2019   Procedure: RIGHT HEART CATH;  Surgeon: Larey Dresser, MD;  Location: [facility];  Service: Cardiovascular;  Laterality: [name];   Social History   Socioeconomic History  . Marital status: Married    Spouse name: Not on file  . Number of children: Not on file  . Years of education: 26  . Highest education level: Not on file  Occupational History  . Occupation: Therapist, music: [employer]  Tobacco Use  . Smoking status: Never Smoker  . Smokeless  tobacco: Never Used  Vaping Use  . Vaping Use: Never used  Substance and Sexual Activity  . Alcohol use: No    Alcohol/week: 0.0 standard drinks  . Drug use: No  . Sexual activity: Yes    Partners: Male    Birth control/protection: Surgical    Comment: hysterectomy (supracervical)  Other Topics Concern  . Not on file  Social History Narrative   Regular exercise-no   Caffeine Use-no      [facility]: Patient lives with her husband. Works for [employer]. No bird or mold exposure. No pets currently.         Social Determinants of Health   Financial Resource Strain:   . Difficulty of Paying Living Expenses:   Food Insecurity:   . Worried About Charity fundraiser in the Last Year:   . Arboriculturist in the Last Year:   Transportation Needs:   . Film/video editor (Medical):   [name] Lack of Transportation (Non-Medical):   Physical Activity:   . Days of Exercise per Week:   . Minutes of Exercise per Session:   Stress:   . Feeling of Stress :   Social Connections:   . Frequency of Communication with Friends and Family:   . Frequency of Social Gatherings with Friends and Family:   . Attends Religious Services:   . Active Member of Clubs or Organizations:   . Attends Archivist Meetings:   [name] Marital Status:    Outpatient Encounter Medications as of 03/15/2020  Medication Sig  . acetaminophen (TYLENOL) 325 MG tablet Take 2 tablets (650 mg total) by mouth every 6 (six) hours as needed for mild pain (or Fever >/= 101).  [name] aspirin EC 81 MG tablet Take 1 tablet (81 mg total) by mouth daily with breakfast.  . calcium carbonate (OS-CAL - DOSED IN MG OF ELEMENTAL CALCIUM) 1250 (500 Ca) MG tablet Take 3 tablets (1,500 mg of elemental calcium total) by mouth 3 (three) times daily with meals.  [name] ceFEPIme 2 g in sodium chloride 0.9 % 100 mL Inject 2 g into the vein every 12 (twelve) hours.  [name] epoprostenol (VELETRI) 1.5 MG injection Inject into the vein.  [name]  levothyroxine (SYNTHROID) 75 MCG tablet Take 1 tablet (75 mcg total) by mouth daily before breakfast.  . magnesium oxide (MAG-OX) 400 MG tablet Take 0.5 tablets (200 mg total) by mouth daily with breakfast.  . Magnesium Oxide 250 MG TABS Take 1 tablet by mouth daily with breakfast.  . midodrine (PROAMATINE) 5 MG tablet Take 3 tablets (15 mg total) by mouth 3 (three) times daily with meals.  . milrinone (PRIMACOR) 20 MG/100 ML SOLN infusion Inject 0.0233 mg/min into the vein continuous.  . Multiple Vitamin (MULTIVITAMIN  WITH MINERALS) TABS tablet Take 1 tablet by mouth daily.  . mycophenolate (MYFORTIC) 360 MG TBEC EC tablet Take 720 mg by mouth 2 (two) times daily.   . pantoprazole (PROTONIX) 20 MG tablet Take 1 tablet (20 mg total) by mouth daily.  . pantoprazole (PROTONIX) 20 MG tablet 1 TABLET EACH MORNING, MAY TAKE ADDITIONAL DOSE IN THE EVENING IF NEEDED FOR HEARTBURN/INDIGESTION.  [name] senna-docusate (SENOKOT-S) 8.6-50 MG tablet Take 2 tablets by mouth at bedtime.  . vancomycin (VANCOREADY) 1250 MG/250ML SOLN Inject 250 mLs (1,250 mg total) into the vein daily.  . Vitamin D, Ergocalciferol, (DRISDOL) 1.25 MG (50000 UT) CAPS capsule Take 1 capsule (50,000 Units total) by mouth every Thursday. (Patient taking differently: Take 50,000 Units by mouth every 7 (seven) days. )  . [DISCONTINUED] levothyroxine (SYNTHROID) 75 MCG tablet TAKE 1 TABLET (75 MCG TOTAL) BY MOUTH DAILY BEFORE BREAKFAST.  . [DISCONTINUED] potassium chloride (K-DUR) 10 MEQ tablet Take 2 tablets by mouth twice daily   No facility-administered encounter medications on file as of 03/15/2020.   ALLERGIES: Allergies  Allergen Reactions  . Peanut Oil Swelling  . Potassium Iodide Other (See Comments)    facial swelling, also eyes, and ears  . Ace Inhibitors Cough  . Doxycycline Hives  . Peanut-Containing Drug Products Nausea And Vomiting  . Chlorhexidine Hives and Itching  . Amlodipine Rash  . Codeine Nausea And Vomiting  .  Latex Hives and Rash  . Penicillins Hives, Itching and Rash    .Marland KitchenHas patient had a PCN reaction causing immediate rash, facial/tongue/throat swelling, SOB or lightheadedness with hypotension: No Has patient had a PCN reaction causing severe rash involving mucus membranes or skin necrosis: No Has patient had a PCN reaction that required hospitalization No Has patient had a PCN reaction occurring within the last 10 years: YES (3 years ago)  If all of the above answers are "NO", then may proceed with Cephalosporin use.     VACCINATION STATUS: Immunization History  Administered Date(s) Administered  . Influenza Split 06/22/2011, 05/13/2018  . Influenza Whole 07/11/2006  . Influenza,inj,Quad PF,6+ Mos 06/26/2013, 05/11/2014, 06/13/2015, 05/20/2017  . Influenza-Unspecified 06/26/2013, 05/11/2014, 06/13/2015, 06/06/2016, 05/20/2017  . Pneumococcal Conjugate-13 02/07/2015  . Pneumococcal Polysaccharide-23 06/26/2013  . Td 04/11/2004  . Tdap 05/09/2015    HPI   59 year old female patient with medical history as above. -She is being seen in follow up for hypocalcemia, hypothyroidism, hypokalemia, hypomagnesemia.   She is on multiple supplements for electrolytes and thyroid hormone. 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  She remains  on a regular supplement with calcium, magnesium, and vitamin D.    Although she had a series of hospitalizations for electrolyte abnormalities in the past, she did not have to go to ER since last visit.    Her previsit labs show calcium stable at 7.4.  She is on calcium carbonate 1250 mg 3 tablets p.o. 3 times daily AC.  She is also on potassium supplement for hypokalemia as well as magnesium supplement for hypomagnesemia.  She does not report any new symptoms today.  -The exact etiology for her multiple electrolyte abnormalities is not settled.  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   -She  has history of multinodular goiter with biopsy of one of her nodules consistent with hyperblastic nodule.  Repeat thyroid ultrasound on March 13, 2017 was remarkable for another left lobe nodule 1.5 cm with benign features. She did not show up  for her appointment for surveillance thyroid ultrasound.   -She is on levothyroxine 75 mcg for hypothyroidism, reports compliance with this medication -She denies dysphagia, shortness of breath, voice change. - She has a history of prolonged QT interval.  She has no new complaints today.  Review of Systems  Limited as above.  Objective:    BP 112/70   Pulse 84   Ht 5' (1.524 m)   Wt 182 lb 6.4 oz (82.7 kg)   LMP 10/31/2002   BMI 35.62 kg/m   Wt Readings from Last 3 Encounters:  03/15/20 182 lb 6.4 oz (82.7 kg)  03/02/20 186 lb 1.9 oz (84.4 kg)  12/10/19 189 lb 6.4 oz (85.9 kg)      Physical Exam- Limited  Constitutional:  Body mass index is 35.62 kg/m. , not in acute distress, normal state of mind Eyes:  EOMI, no exophthalmos Neck: Supple Thyroid: + Palpable gross goiter.   Respiratory: Adequate breathing efforts Musculoskeletal: no gross deformities, strength intact in all four extremities, no gross restriction of joint movements Skin:  + Scattered, tight, scaly skin patient on torso and upper extremity, neck.   Neurological: no tremor with outstretched hands,   CMP     Component Value Date/Time   NA 141 03/11/2020 0946   NA 142 09/30/2018 1409   K 3.7 03/11/2020 0946   CL 102 03/11/2020 0946   CO2 25 03/11/2020 0946   GLUCOSE 83 03/11/2020 0946   BUN 20 03/11/2020 0946   BUN 23 09/30/2018 1409   CREATININE 1.40 (H) 03/11/2020 0946   CALCIUM 7.4 (L) 03/11/2020 0946   CALCIUM 7.8 (L) 12/02/2008 2239   PROT 7.6 03/11/2020 0946   ALBUMIN 3.6 08/17/2019 0421   AST 10 03/11/2020 0946   ALT 4 (L) 03/11/2020 0946   ALKPHOS 82 08/17/2019 0421   BILITOT 0.4 03/11/2020 0946   GFRNONAA 42 (L) 03/11/2020 0946   GFRAA 49 (L)  03/11/2020 0946     Diabetic Labs (most recent): Lab Results  Component Value Date   HGBA1C 5.3 12/10/2019   HGBA1C 6.2 (H) 04/22/2019   HGBA1C 5.7 (H) 12/23/2017     Lipid Panel ( most recent) Lipid Panel     Component Value Date/Time   CHOL 166 12/23/2017 0738   TRIG 109 12/23/2017 0738   HDL 35 (L) 12/23/2017 0738   CHOLHDL 4.7 12/23/2017 0738   VLDL 18 12/11/2016 0743   LDLCALC 109 (H) 12/23/2017 0738    Thyroid ultrasound on March 07, 2020 IMPRESSION: 1. Slight interval growth of the previously biopsied nodule in the thyroid isthmus. Recommend correlation with prior biopsy results. 2. Continued stability of left mid thyroid nodule which was previously declared benign after demonstrating greater than 5 year stability. 3. No new or suspicious thyroid nodules.  Assessment & Plan:   1. 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  Not due to hypoparathyroidism.  She has elevated PTH appropriately in response to hypocalcemia.  -She has maintained low normal calcium, currently 7.4 improving from 6.4, asymptomatic.   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  -She is going to continue multiple electrolyte supplements at high dose.  She is advised to continue calcium carbonate 1250 mg (500 mg elemental calcium), 3  tablets 3 times a day with meals.  This will supplement 4500 mg of elemental calcium daily. -She is advised to continue magnesium oxide 250 mg p.o. daily  with breakfast. -She is advised to continue her potassium supplement at 20 mEq twice a day.      -She is advised to call clinic if she develops symptoms of hypocalcemia including cramps, tingling,.   2. hypothyroidism -Her recent thyroid function tests are consistent with appropriate replacement.   She is advised to continue levothyroxine 75 mg p.o. daily before  breakfast.     - We discussed about the correct intake of her thyroid hormone, on empty stomach at fasting, with water, separated by at least 30 minutes from breakfast and other medications,  and separated by more than 4 hours from calcium, iron, multivitamins, acid reflux medications (PPIs). -Patient is made aware of the fact that thyroid hormone replacement is needed for life, dose to be adjusted by periodic monitoring of thyroid function tests.    3.  Nodular goiter -Her thyroid ultrasound from August 2018 was remarkable for stable isthmus nodule at 1.8 cm and new 1.5 cm nodule on the left lobe with benign and non- suspicious features.  -Her previsit thyroid ultrasound is unremarkable.  No new findings.  She does not need any intervention at this time.    4.  Type 2 diabetes-controlled with point-of-care A1c of 5.3% today.   Not on medications currently.    - I advised patient to maintain close follow up with Fayrene Helper, MD for primary care needs.      - Time spent on this patient care encounter:  35 minutes of which 50% was spent in  counseling and the rest reviewing  her current and  previous labs / studies and medications  doses and developing a plan for long term care. Mariaeduarda Defranco [place]  participated in the discussions, expressed understanding, and voiced agreement with the above plans.  All questions were answered to her satisfaction. she is encouraged to contact clinic should she have any questions or concerns prior to her return visit.    Follow up plan: Return in about 6 months (around 09/15/2020) for F/U with Pre-visit Labs.  Glade Lloyd, MD Phone: [phone]  Fax: [phone]   03/15/2020, 1:17 PM

## 2020-03-18 ENCOUNTER — Other Ambulatory Visit: Payer: Self-pay

## 2020-03-18 ENCOUNTER — Ambulatory Visit
Admission: RE | Admit: 2020-03-18 | Discharge: 2020-03-18 | Disposition: A | Discharge status: Home / Self Care | Payer: 59 | Source: Ambulatory Visit | Attending: Family Medicine | Admitting: Family Medicine

## 2020-03-18 DIAGNOSIS — Z1231 Encounter for screening mammogram for malignant neoplasm of breast: Secondary | ICD-10-CM

## 2020-05-02 ENCOUNTER — Encounter (HOSPITAL_COMMUNITY): Payer: 59

## 2020-05-09 NOTE — ED Notes (Signed)
ED Pre-Arrival Note        Pre-Arrival Summary    Name:  medic 34,    Current Date:  05/09/2020 22:45:43 EDT  Gender:  Female  Date of Birth:    Age:  59 yrs  Pre-Arrival Type:  EMS  ETA:  05/09/2020 22:58:00 EDT  Primary Care Physician:    Presenting Problem:  syncope  Pre-Arrival User:  Christell Constant RN, Alain Marion  Referring Source:    Location:  [facility]  BP:  129/83  HR:  88  Resp:  18  O2:  96%  Blood Sugar:  243            PreArrival Communication Form  Emergency Department        Additional Patient Information:        Orders:  [    ] CBC                                            [     ] CT Head no contrast  [    ] BMP                                           [     ] CT Abdomen/Pelvis no contrast  [    ] PT/INR                                       [     ] CT Abdomen/Pelvis IV contrast, w/ oral contrast  [    ] Troponin                                   [     ] CT Abdomen/Pelvis IV contrast, no oral contrast  [    ] BNP                                            [     ] See ordersheet  [    ] CXR                                             [     ] Other:__________________________  [    ] EKG

## 2020-05-09 NOTE — ED Notes (Signed)
ED Triage Note       ED Secondary Triage Entered On:  05/09/2020 22:54 EDT    Performed On:  05/09/2020 22:52 EDT by Christell Constant, RN, Alain Marion               General Information   Barriers to Learning :   None evident   Languages :   English   COVID-19 Vaccine Status :   2 Doses received   2 Doses Received Manufacturer :   Pfizer vaccine   ED Home Meds Section :   Document assessment   [facility] ED Fall Risk Section :   Document assessment   ED Advance Directives Section :   Document assessment   ED Palliative Screen :   [name] (prefilled for <65yo)   Christell Constant RN, Alain Marion - 05/09/2020 22:52 EDT   (As Of: 05/09/2020 22:54:22 EDT)   Problems(Active)    CHF (congestive heart failure) (SNOMED CT  :64403474 )  Name of Problem:   CHF (congestive heart failure) ; Recorder:   Christell Constant, RN, Alain Marion; Confirmation:   Confirmed ; Classification:   Patient Stated ; Code:   [id number] ; Contributor System:   PowerChart ; Last Updated:   05/09/2020 22:51 EDT ; Life Cycle Date:   05/09/2020 ; Life Cycle Status:   Active ; Vocabulary:   SNOMED CT        Diabetes (SNOMED CT  :564332951 )  Name of Problem:   Diabetes ; Recorder:   Christell Constant, RN, Alain Marion; Confirmation:   Confirmed ; Classification:   Patient Stated ; Code:   [id number] ; Contributor System:   PowerChart ; Last Updated:   05/09/2020 22:51 EDT ; Life Cycle Date:   05/09/2020 ; Life Cycle Status:   Active ; Vocabulary:   SNOMED CT        Lupus (SNOMED CT  :016010932 )  Name of Problem:   Lupus ; Recorder:   Christell Constant, RN, Alain Marion; Confirmation:   Confirmed ; Classification:   Patient Stated ; Code:   [id number] ; Contributor System:   PowerChart ; Last Updated:   05/09/2020 22:51 EDT ; Life Cycle Date:   05/09/2020 ; Life Cycle Status:   Active ; Vocabulary:   SNOMED CT        Scleroderma (SNOMED CT  :542706237 )  Name of Problem:   Scleroderma ; Recorder:   Christell Constant, RN, Alain Marion; Confirmation:   Confirmed ; Classification:   Patient Stated ; Code:   [id number] ; Contributor System:   PowerChart  ; Last Updated:   05/09/2020 22:52 EDT ; Life Cycle Date:   05/09/2020 ; Life Cycle Status:   Active ; Vocabulary:   SNOMED CT          Diagnoses(Active)    Syncope/Near syncope  Date:   05/09/2020 ; Diagnosis Type:   Reason For Visit ; Confirmation:   Complaint of ; Clinical Dx:   Syncope/Near syncope ; Classification:   Medical ; Clinical Service:   Emergency medicine ; Code:   PNED ; Probability:   0 ; Diagnosis Code:   [id number]             -    Procedure History   (As Of: 05/09/2020 22:54:22 EDT)     [facility] Perch Fall Risk Assessment Tool   Hx of falling last 3 months ED Fall :   No   Patient confused or disoriented ED Fall :   No   Patient intoxicated or sedated ED  Fall :   No   Patient impaired gait ED Fall :   No   Use a mobility assistance device ED Fall :   No   Patient altered elimination ED Fall :   No   [facility] ED Fall Score :   0    Buren Kos - 05/09/2020 22:52 EDT   ED Advance Directive   Advance Directive :   No   Christell Constant RN, Alain Marion - 05/09/2020 22:52 EDT   Med Hx   Medication List   (As Of: 05/09/2020 22:54:22 EDT)

## 2020-05-09 NOTE — ED Notes (Signed)
ED Triage Note       ED Triage Adult Entered On:  05/09/2020 22:52 EDT    Performed On:  05/09/2020 22:46 EDT by Christell Constant, RN, Alain Marion               Triage   Numeric Rating Pain Scale :   0 = No pain   Chief Complaint :   Pt is visiting from [place] and reports multiple episodes of vomiting and syncope today. Pt has a CHF medication, Veletri, infusing through a permacath in her chest. She reports an increase in dose last night. Pt states last syncopal episode while on toilet   [place] Mode of Arrival :   Ambulance   Infectious Disease Documentation :   Document assessment   Temperature Oral :   36.4 degC(Converted to: 97.5 degF)    Heart Rate Monitored :   87 bpm   Respiratory Rate :   18 br/min   Systolic Blood Pressure :   128 mmHg   Diastolic Blood Pressure :   84 mmHg   SpO2 :   98 %   Oxygen Therapy :   Room air   Patient presentation :   None of the above   Chief Complaint or Presentation suggest infection :   No   Weight Dosing :   80.6 kg(Converted to: 177 lb 11 oz)    Height :   152.4 cm(Converted to: 5 ft 0 in)    Body Mass Index Dosing :   35 kg/m2   Buren Kos - 05/09/2020 22:46 EDT   DCP GENERIC CODE   Tracking Acuity :   3   Tracking Group :   ED [employer]   Christell Constant, RNAlain Marion - 05/09/2020 22:46 EDT   ED General Section :   Document assessment   Pregnancy Status :   Patient denies   ED Allergies Section :   Document assessment   ED Reason for Visit Section :   Document assessment   Buren Kos - 05/09/2020 22:46 EDT   PTA/Triage Treatments   ED PTA Pre-Arrival Service :   [facility]   ED PTA Medic Procedures :   EKG-other   ED PTA Medic Number :   31    ED PTA Glucose check :   [address] RN, Alain Marion - 05/09/2020 22:46 EDT   ID Risk Screen Symptoms   Recent Travel History :   No recent travel   Close Contact with COVID-19 ID :   No   Last 14 days COVID-19 ID :   No   TB Symptom Screen :   No symptoms   C. diff Symptom/History ID :   Neither of the above    Buren Kos - 05/09/2020 22:46 EDT   Allergies   (As Of: 05/09/2020 22:52:34 EDT)   Allergies (Active)   penicillins  Estimated Onset Date:   Unspecified ; Reactions:   Rash ; Created By:   Christell Constant, RN, Alain Marion; Reaction Status:   Active ; Category:   Drug ; Substance:   penicillins ; Type:   Allergy ; Severity:   Moderate ; Updated By:   Christell Constant, RN, Alain Marion; Reviewed Date:   05/09/2020 22:50 EDT        Psycho-Social   Last 3 mo, thoughts killing self/others :   Patient denies   Right click within box for Suspected Abuse policy link. :  None   Feels Safe Where Live :   Yes   Buren Kos - 05/09/2020 22:46 EDT   ED Reason for Visit   (As Of: 05/09/2020 22:52:34 EDT)   Problems(Active)    CHF (congestive heart failure) (SNOMED CT  :13086578 )  Name of Problem:   CHF (congestive heart failure) ; Recorder:   Christell Constant, RN, Alain Marion; Confirmation:   Confirmed ; Classification:   Patient Stated ; Code:   [id number] ; Contributor System:   PowerChart ; Last Updated:   05/09/2020 22:51 EDT ; Life Cycle Date:   05/09/2020 ; Life Cycle Status:   Active ; Vocabulary:   SNOMED CT        Diabetes (SNOMED CT  :841324401 )  Name of Problem:   Diabetes ; Recorder:   Christell Constant, RN, Alain Marion; Confirmation:   Confirmed ; Classification:   Patient Stated ; Code:   [id number] ; Contributor System:   PowerChart ; Last Updated:   05/09/2020 22:51 EDT ; Life Cycle Date:   05/09/2020 ; Life Cycle Status:   Active ; Vocabulary:   SNOMED CT        Lupus (SNOMED CT  :403474259 )  Name of Problem:   Lupus ; Recorder:   Christell Constant, RN, Alain Marion; Confirmation:   Confirmed ; Classification:   Patient Stated ; Code:   [id number] ; Contributor System:   PowerChart ; Last Updated:   05/09/2020 22:51 EDT ; Life Cycle Date:   05/09/2020 ; Life Cycle Status:   Active ; Vocabulary:   SNOMED CT        Scleroderma (SNOMED CT  :329518841 )  Name of Problem:   Scleroderma ; Recorder:   Christell Constant, RN, Alain Marion; Confirmation:   Confirmed ; Classification:    Patient Stated ; Code:   [id number] ; Contributor System:   PowerChart ; Last Updated:   05/09/2020 22:52 EDT ; Life Cycle Date:   05/09/2020 ; Life Cycle Status:   Active ; Vocabulary:   SNOMED CT          Diagnoses(Active)    Syncope/Near syncope  Date:   05/09/2020 ; Diagnosis Type:   Reason For Visit ; Confirmation:   Complaint of ; Clinical Dx:   Syncope/Near syncope ; Classification:   Medical ; Clinical Service:   Emergency medicine ; Code:   PNED ; Probability:   0 ; Diagnosis Code:   [id number]

## 2020-05-09 NOTE — ED Provider Notes (Signed)
Syncope/Near syncope        Patient:   Erin Gill, Erin Gill             MRN: [id number]            FIN: [id number]               Age:   59 years     Sex:  Female     DOB:  [birth year]   Associated Diagnoses:   Syncope; Hypocalcemia   Author:   Scot Jun B-MD      Basic Information   Time seen: Provider Seen (ST)   ED Provider/Time:    Scot Jun B-MD / 05/09/2020 22:48  .   Additional information: Chief Complaint from Nursing Triage Note   Chief Complaint   No qualifying data available.[name]      History of Present Illness   The patient presents with syncope and 59 year old female history of right-sided heart failure is on constant infusion of velteri presents for evaluation of 2 episodes of syncope while sitting on the toilet.  Patient previous to this had developed nausea and vomiting dry heaves around 8 hours ago [place] a mall when this occurred.  No chest pain no shortness of breath no abdominal pain no vomiting diarrhea.  Had another syncopal episode but a week ago at home under symptoms similar circumstances.  Currently has no complaints.  This occurred just prior to arrival patient arrived by EMS.  The onset was just prior to arrival.  The course/duration of symptoms is resolved.  The location where the incident occurred was in the street.  The exacerbating factor is sitting.  The relieving factor is none.  Risk factors consist of hypertension.  Prior episodes: orthostasis.  Therapy today: emergency medical services.  Associated symptoms: nausea and vomiting.  Associated injury to the none.        Review of Systems   Constitutional symptoms:  No generalized weakness,    Cardiovascular symptoms:  Syncope, No chest pain,    Gastrointestinal symptoms:  Nausea, vomiting, No abdominal pain,    Musculoskeletal symptoms:  No back pain,    Neurologic symptoms:  No headache,              Additional review of systems information: All other systems reviewed and otherwise negative.      Health Status   Allergies:     Allergic Reactions (Selected)  Moderate  Penicillins- Rash..      Past Medical/ Family/ Social History   Medical history: Reviewed as documented in chart.   Surgical history: Reviewed as documented in chart.   Family history: Not significant.   Social history: Reviewed as documented in chart.   Problem list:    Active Problems (1)  CHF (congestive heart failure)   .      Physical Examination   Glasgow coma scale:  Total score: Total score: 15.   Neurological:  Alert and oriented to person, place, time, and situation, No focal neurological deficit observed.    General:  Alert, no acute distress.    Skin:  Warm, dry, intact, no rash.    Head:  Normocephalic.   Neck:  Trachea midline.   Eye:  Normal conjunctiva.   Ears, nose, mouth and throat:  Oral mucosa moist.   Cardiovascular:  Regular rate and rhythm, Normal peripheral perfusion.    Respiratory:  Lungs are clear to auscultation, respirations are non-labored, breath sounds are equal.    Gastrointestinal:  Soft,  Nontender, Non distended.    Musculoskeletal:  Normal ROM, normal strength.    Lymphatics:  No lymphadenopathy.   Psychiatric:  Cooperative, appropriate mood & affect.       Medical Decision Making   Rationale:  Past medical and surgical history reviewed by me and also meds and allergies.   Documents reviewed:  Emergency department nurses' notes, emergency medical system run report.    Cardiac monitor:  Time 05/09/2020 23:27:00, Rate 89, normal sinus rhythm, No ectopy.    Electrocardiogram:  Emergency Provider interpretation performed by me, rate 89, normal sinus rhythm, Reviewed Results: (Date Range: 05/08/2020 0:00 EDT - 05/09/2020 22:59 EDT).    Results review:  Lab results : Lab View   05/09/2020 23:18 EDT Estimated Creatinine Clearance 34.73 mL/min   05/09/2020 22:58 EDT WBC 11.2 x10e3/mcL  HI    RBC 3.40 x10e6/mcL  LOW    Hgb 8.2 g/dL  LOW    HCT 27.2 %  LOW    MCV 80.0 fL  LOW    MCH 24.1 pg  LOW    MCHC 30.1 g/dL  LOW    RDW 16.2 %  HI    Platelet 241  x10e3/mcL    MPV 10.5 fL    Neutro Auto 77.7 %  HI    Neutro Absolute 8.7 x10e3/mcL  HI    Immature Grans Percent 1.2 %  HI    Immature Grans Absolute 0.13 x10e3/mcL  HI    Lymph Auto 15.7 %    Lymph Absolute 1.8 x10e3/mcL    Mono Auto 3.6 %  LOW    Mono Absolute 0.4 x10e3/mcL    Eosinophil Percent 1.5 %    Eos Absolute 0.2 x10e3/mcL    Basophil Auto 0.3 %    Baso Absolute 0.0 x10e3/mcL    NRBC Absolute Auto 0.000 x10e3/mcL    NRBC Percent Auto 0.0 %    Sodium Lvl 137 mmol/L    Potassium Lvl 3.5 mmol/L    Chloride 97 mmol/L  LOW    CO2 20 mmol/L  LOW    Glucose Random 192 mg/dL  HI    BUN 31 mg/dL  HI    Creatinine Lvl 1.7 mg/dL  HI    AGAP 20 mmol/L  HI    Osmolality Calc 285 mOsm/kg    Calcium Lvl 6.5 mg/dL  LOW    eGFR AA 38 mL/min/1.93m???  LOW    eGFR Non-AA 33 mL/min/1.764m??  LOW    Magnesium Lvl 2.3 mg/dL    SARS-CoV-2 Only ([place]) Not Detected     , Interpretation Calcium is mildly depleted otherwise lab work is unremarkable there is evidence of renal insufficiency with do not know chronic labs.       Reexamination/ Reevaluation   Time: 05/10/2020 00:05:00 .   Course: unchanged.   Assessment: exam unchanged.      Impression and Plan   Diagnosis   Syncope (ICD10-CM R55, Discharge, Medical)   Hypocalcemia (ICD10-CM E83.51, Discharge, Medical)   Plan   Condition: Stable.    Prescriptions: Launch prescriptions   Pharmacy:  calcium (as carbonate) 600 mg oral tablet (Prescribe): 600 mg, 1 tabs, Oral, BID, 60 tabs, 0 Refill(s)  , Launch prescriptions   Pharmacy:  Zofran 4 mg oral tablet (Prescribe): 4 mg, 1 tabs, Oral, q4hr, for 3 days, PRN: nausea, 12 tabs, 0 Refill(s)  .    Patient was given the following educational materials: Calcium Intake Recommendations, Hypocalcemia, Adult, Syncope, Syncope, Hypocalcemia, Adult, Calcium Intake Recommendations.  Follow up with: Follow up with primary care provider Within 1 week.    Counseled: Patient.    Signature Line     Electronically Signed on 05/10/2020 12:08 AM EDT    ________________________________________________   Scot Jun B-MD      Electronically Signed on 05/10/2020 12:12 AM EDT   ________________________________________________   Scot Jun B-MD            Modified by: Scot Jun B-MD on 05/09/2020 10:54 PM EDT      Modified by: Scot Jun B-MD on 05/09/2020 10:55 PM EDT      Modified by: Scot Jun B-MD on 05/09/2020 10:58 PM EDT      Modified by: Scot Jun B-MD on 05/09/2020 10:59 PM EDT      Modified by: Scot Jun B-MD on 05/09/2020 11:28 PM EDT      Modified by: Scot Jun B-MD on 05/10/2020 12:06 AM EDT      Modified by: Scot Jun B-MD on 05/10/2020 12:07 AM EDT      Modified by: Scot Jun B-MD on 05/10/2020 12:08 AM EDT      Modified by: Scot Jun B-MD on 05/10/2020 12:12 AM EDT

## 2020-05-10 LAB — CBC WITH AUTO DIFFERENTIAL
Absolute Baso #: 0 10*3/uL (ref 0.0–0.2)
Absolute Eos #: 0.2 10*3/uL (ref 0.0–0.5)
Absolute Lymph #: 1.8 10*3/uL (ref 1.0–3.2)
Absolute Mono #: 0.4 10*3/uL (ref 0.3–1.0)
Basophils %: 0.3 % (ref 0.0–2.0)
Eosinophils %: 1.5 % (ref 0.0–7.0)
Hematocrit: 27.2 % — ABNORMAL LOW (ref 34.0–47.0)
Hemoglobin: 8.2 g/dL — ABNORMAL LOW (ref 11.5–15.7)
Immature Grans (Abs): 0.13 10*3/uL — ABNORMAL HIGH (ref 0.00–0.06)
Immature Granulocytes: 1.2 % — ABNORMAL HIGH (ref 0.1–0.6)
Lymphocytes: 15.7 % (ref 15.0–45.0)
MCH: 24.1 pg — ABNORMAL LOW (ref 27.0–34.5)
MCHC: 30.1 g/dL — ABNORMAL LOW (ref 32.0–36.0)
MCV: 80 fL — ABNORMAL LOW (ref 81.0–99.0)
MPV: 10.5 fL (ref 7.2–13.2)
Monocytes: 3.6 % — ABNORMAL LOW (ref 4.0–12.0)
NRBC Absolute: 0 10*3/uL (ref 0.000–0.012)
NRBC Automated: 0 % (ref 0.0–0.2)
Neutrophils %: 77.7 % — ABNORMAL HIGH (ref 42.0–74.0)
Neutrophils Absolute: 8.7 10*3/uL — ABNORMAL HIGH (ref 1.6–7.3)
Platelets: 241 10*3/uL (ref 140–440)
RBC: 3.4 x10e6/mcL — ABNORMAL LOW (ref 3.60–5.20)
RDW: 16.2 % — ABNORMAL HIGH (ref 11.0–16.0)
WBC: 11.2 10*3/uL — ABNORMAL HIGH (ref 3.8–10.6)

## 2020-05-10 LAB — BASIC METABOLIC PANEL
Anion Gap: 20 mmol/L — ABNORMAL HIGH (ref 2–17)
BUN: 31 mg/dL — ABNORMAL HIGH (ref 6–20)
CO2: 20 mmol/L — ABNORMAL LOW (ref 22–29)
Calcium: 6.5 mg/dL — ABNORMAL LOW (ref 8.6–10.0)
Chloride: 97 mmol/L — ABNORMAL LOW (ref 98–107)
Creatinine: 1.7 mg/dL — ABNORMAL HIGH (ref 0.5–1.0)
GFR African American: 38 mL/min/{1.73_m2} — ABNORMAL LOW (ref >=90)
GFR Non-African American: 33 mL/min/{1.73_m2} — ABNORMAL LOW (ref >=90)
Glucose: 192 mg/dL — ABNORMAL HIGH (ref 70–99)
OSMOLALITY CALCULATED: 285 mOsm/kg (ref 270–287)
Potassium: 3.5 mmol/L (ref 3.5–5.3)
Sodium: 137 mmol/L (ref 135–145)

## 2020-05-10 LAB — COVID-19: SARS-CoV-2: NOT DETECTED

## 2020-05-10 LAB — MAGNESIUM: Magnesium: 2.3 mg/dL (ref 1.6–2.6)

## 2020-05-10 NOTE — ED Notes (Signed)
 ED Patient Summary       ;       [facility] Emergency Department  [address] [place] [address]  [phone]  Discharge Instructions (Patient)  Name: Erin Gill, Erin Gill  DOB:  1961/04/07                   MRN: [id number]                   FIN: [phone]  Reason For Visit: Syncope/Near syncope; SYNCOPE  Final Diagnosis: Hypocalcemia; Syncope     Visit Date: 05/09/2020 22:44:00  Address: [address] [place] [address]  Phone:      Emergency Department Providers:         Primary Physician:      ABRAM LONNI NOVAK         [facility] would like to thank you for allowing us  to assist you with your healthcare needs. The following includes patient education materials and information regarding your injury/illness.     Follow-up Instructions:  You were seen today on an emergency basis. Please contact your primary care doctor for a follow up appointment. If you received a referral to a specialist doctor, it is important you follow-up as instructed.    It is important that you call your follow-up doctor to schedule and confirm the location of your next appointment. Your doctor may practice at multiple locations. The office location of your follow-up appointment may be different to the one written on your discharge instructions.    If you do not have a primary care doctor, please call [phone] for help in finding a [facility] Provider. For help in finding a specialist doctor, please call [phone].    If your condition gets worse before your follow-up with your primary care doctor or specialist, please return to the Emergency Department.      Coronavirus 2019 (COVID-19) Reminders:     Patients age 82 - 79, with parental consent, and patients over age 41 can make an appointment for a COVID-19 vaccine. Patients can contact their [facility] doctors' offices to schedule an appointment to receive the COVID-19 vaccine. Patients who do not have a [facility] physician can call [phone] to schedule vaccination appointments.      Follow Up Appointments:  Primary Care Provider:     Name: PCP,  NONE     Phone:                  With: Address: When:   Follow up with primary care provider  Within 1 week                   New Medications  Printed Prescriptions  calcium carbonate (calcium (as carbonate) 600 mg oral tablet) 1 Tabs Oral (given by mouth) 2 times a day. Refills: 0.,  NOTE 1250MG  OF CALCIUM  CARBONATE IS EQUIVALENT TO 500MG  OF  ELEMENTAL CALCIUM  Last Dose:____________________  ondansetron (Zofran 4 mg oral tablet) 1 Tabs Oral (given by mouth) every 4 hours as needed nausea for 3 Days. Refills: 0.  Last Dose:____________________      Allergy Info: penicillins     Discharge Additional Information          Discharge Patient 05/10/20 0:08:00 EDT      Patient Education Materials:        Syncope    Syncope is when you temporarily lose consciousness. Syncope may also  be called fainting or passing out. It is caused by a sudden decrease in blood flow to the brain. Even though most causes of syncope are not dangerous, syncope can be a sign of a serious medical problem. Signs that you may be about to faint include:       Feeling dizzy or light-headed.     Feeling nauseous.     Seeing all white or all black in your field of vision.     Having cold, clammy skin.    If you fainted, get medical help right away.?Call your local emergency services (911 in the [place] Do not drive yourself to the hospital.    HOME CARE INSTRUCTIONS    Pay attention to any changes in your symptoms. Take these actions to help with your condition:     Have someone stay with you until you feel stable.     Do not drive, use machinery, or play sports until your health care provider says it is okay.     Keep all follow-up visits as told by your health care provider. This is important.     If you start to feel like you might faint, lie down right away and raise (elevate) your feet above the  level of your heart. Breathe deeply and steadily. Wait until all of the symptoms have passed.     Drink enough fluid to keep your urine clear or pale yellow.     If you are taking blood pressure or heart medicine, get up slowly and take several minutes to sit and then stand. This can reduce dizziness.     Take over-the-counter and prescription medicines only as told by your health care provider.    SEEK IMMEDIATE MEDICAL CARE IF:     You have a severe headache.     You have unusual pain in your chest, abdomen, or back.     You are bleeding from your mouth or rectum, or you have black or tarry stool.     You have a very fast or irregular heartbeat (palpitations).     You have pain with breathing.     You faint once or repeatedly.     You have a seizure.     You are confused.     You have trouble walking.     You have severe weakness.     You have vision problems.    These symptoms may represent a serious problem that is an emergency. Do not wait to see if your symptoms will go away. Get medical help right away. Call your local emergency services (911 in the [place] Do not drive yourself to the hospital.    This information is not intended to replace advice given to you by your health care provider. Make sure you discuss any questions you have with your health care provider.    Document Released: 07/30/2005 Document Revised: 07/11/2015 Document Reviewed: 04/13/2015  Elsevier Interactive Patient Education ?2016 Elsevier Inc.       Hypocalcemia, Adult    Hypocalcemia is when the level of calcium in a person's blood is below normal. Calcium is a mineral that is used by the body in many ways. A lack of blood calcium can affect the heart and muscles, make the bones more likely to break, and cause other problems.    CAUSES    This condition may be caused by:     Decreased production (hypoparathyroidism) or improper use of parathyroid hormone.     Problems  with the parathyroid glands or surgical removal of these glands.      Problems with parathyroid function after removal of the thyroid gland.     Lack (deficiency) of vitamin D or magnesium or both.     Kidney problems.    Less common causes include:     Intestinal problems that interfere with nutrient absorption.     Alcoholism.     Low levels of a body protein that is called albumin.     Inflammation of the pancreas (pancreatitis).     Certain medicines.     Severe infections (sepsis).     Certain diseases, such as sarcoidosis or hemochromatosis, that cause the parathyroid glands to be filled with cells or substances that are not normally present.     Breakdown of large amounts of muscle fiber.     High levels of phosphate in the body.     Cancer.     Massive blood transfusions, which usually occur with severe trauma.    SYMPTOMS    Symptoms of this condition include:     Numbness and tingling in the fingers, toes, or around the mouth.     Muscle aches or cramps, especially in the legs, feet, and back.     Muscle twitches.     Abdominal cramping or pain.     Memory problems, confusion, or difficulty thinking.     Depression, anxiety, irritability, or changes in personality.     Fainting.     Chest pain.     Difficulty swallowing.     Changes in the sound of the voice.     Shortness of breath or wheezing.     General weakness and fatigue.    Symptoms of severe hypocalcemia include:     Shaking uncontrollably (seizures).     Seizure of the voice box (laryngospasm).     Fast heartbeats (palpitations) and abnormal heart rhythms (arrhythmias).    Long-term symptoms of this condition include:     Coarse, brittle hair and nails.     Dry skin or lasting (chronic) skin diseases (psoriasis, eczema,, or dermatitis).     Clouding of the eye lens (cataracts).    DIAGNOSIS    This condition is usually diagnosed with a blood test. You may also have other tests to help determine the underlying cause of the condition. For example, a test may be done that records the electrical activity of the heart  (electrocardiogram,or ECG).    TREATMENT    Treatment for this condition may include:     Calcium given by mouth (orally) or given through an IV tube that is inserted into one of your veins. The method used for giving calcium will depend on the severity of the condition.     Other minerals (electrolytes), such as magnesium.    Other treatment will depend on the cause of the condition.    HOME CARE INSTRUCTIONS     Follow diet instructions from your health care provider or dietitian.     Take supplements only as told by your health care provider.     Keep all follow-up visits as told by your health care provider. This is important.    SEEK MEDICAL CARE IF:     You have increased fatigue.     You have increased muscle twitching.     You have new swelling in the feet, ankles, or legs.     You develop changes in mood, memory, or personality.  SEEK IMMEDIATE MEDICAL CARE IF:     You have chest pain.     You have persistent rapid or irregular heartbeats.     You have difficulty breathing.     You faint.     You start to have seizures.     You have confusion.    This information is not intended to replace advice given to you by your health care provider. Make sure you discuss any questions you have with your health care provider.    Document Released: 01/17/2010 Document Revised: 07/11/2015 Document Reviewed: 12/15/2014  Elsevier Interactive Patient Education ?2016 Elsevier Inc.       Calcium Intake Recommendations    Calcium is a mineral that affects many functions in the body, including:     Blood clotting.     Blood vessel function.     Nerve impulse conduction.     Hormone secretion.     Muscle contraction.     Bone and teeth functions.     Most of your body's calcium supply is stored in your bones and teeth. When your calcium stores are low, you may be at risk for low bone mass, bone loss, and bone fractures. Consuming enough calcium helps to grow healthy bones and teeth and to prevent breakdown over time.     It is  very important that you get enough calcium if you are:     A child undergoing rapid growth.     An adolescent girl.     A pre- or post-menopausal woman.     A woman whose menstrual cycle has stopped due to anorexia nervosa or regular intense exercise.     An individual with lactose intolerance or a milk allergy.     A vegetarian.    WHAT IS MY PLAN?    Try to consume the recommended amount of calcium daily based on your age. Depending on your overall health, your health care provider may recommend increased calcium intake.?General daily calcium intake recommendations by age are:     Birth to 6 months: 200 mg.     Infants 7 to 12 months: 260 mg.     Children 1 to 3 years: 700 mg.     Children 4 to 8 years: 1,000 mg.     Children 9 to 13 years: 1,300 mg.     Teens 14 to 18 years: 1,300 mg.     Adults 19 to 50 years: 1,000 mg.     Adult women 51 to 70 years: 1,200 mg.     Adult men 51 to 70 years: 1,000 mg.     Adults 71 years and older: 1,200 mg.     Pregnant and breastfeeding teens: 1,300 mg.     Pregnant and breastfeeding adults: 1,000 mg.    WHAT DO I NEED TO KNOW ABOUT CALCIUM INTAKE?     In order for the body to absorb calcium, it needs vitamin D. You can get vitamin D through:    ? Direct exposure of the skin to sunlight.    ? Foods, such as egg yolks, liver, saltwater fish, and fortified milk.     ? Supplements.      Consuming too much calcium may cause:    ? Constipation.    ? Decreased absorption of iron and zinc.    ? Kidney stones.     Calcium supplements may interact with certain medicines. Check with your health care provider before starting any calcium supplements.  Try to get most of your calcium from food.     WHAT FOODS CAN I EAT?    Grains    Fortified oatmeal. Fortified ready-to-eat cereals. Fortified frozen waffles.    Vegetables    Turnip greens. Broccoli.     Fruits    Fortified orange juice.    Meats and Other Protein Sources    Canned sardines with bones. Canned salmon with bones. Soy  beans. Tofu. Baked beans. Almonds. [place] nuts. Sunflower seeds.    Dairy    Milk. Yogurt. Cheese. Cottage cheese.     Beverages    Fortified soy milk. Fortified rice milk.     Sweets/Desserts    Pudding. Ice Cream. Milkshakes. Blackstrap molasses.    The items listed above may not be a complete list of recommended foods or beverages. Contact your dietitian for more options.     WHAT FOODS CAN AFFECT MY CALCIUM INTAKE?    It may be more difficult for your body to use calcium or calcium may leave your body more quickly if you consume large amounts of:      Sodium.     Protein.     Caffeine.     Alcohol.    This information is not intended to replace advice given to you by your health care provider. Make sure you discuss any questions you have with your health care provider.    Document Released: 03/13/2004 Document Revised: 08/20/2014 Document Reviewed: 01/05/2014  Elsevier Interactive Patient Education ?2016 Elsevier Inc.      ---------------------------------------------------------------------------------------------------------------------  [facility]) encourages you to self-enroll in the [facility] Patient Portal.  [facility] Patient Portal will allow you to manage your personal health information securely from your own electronic device now and in the future.  To begin your Patient Portal enrollment process, please visit [email or web address] Click on "Sign up now" under [place].  If you find that you need additional assistance on the [facility] Patient Portal or need a copy of your medical records, please call the [facility] Medical Records Office at [phone].  Comment:

## 2020-05-10 NOTE — ED Notes (Signed)
ED Patient Education Note     Patient Education Materials Follows:  Cardiovascular     Syncope    Syncope is when you temporarily lose consciousness. Syncope may also be called fainting or passing out. It is caused by a sudden decrease in blood flow to the brain. Even though most causes of syncope are not dangerous, syncope can be a sign of a serious medical problem. Signs that you may be about to faint include:       Feeling dizzy or light-headed.     Feeling nauseous.     Seeing all white or all black in your field of vision.     Having cold, clammy skin.    If you fainted, get medical help right away.?Call your local emergency services (911 in the [place] Do not drive yourself to the hospital.    HOME CARE INSTRUCTIONS    Pay attention to any changes in your symptoms. Take these actions to help with your condition:     Have someone stay with you until you feel stable.     Do not drive, use machinery, or play sports until your health care provider says it is okay.     Keep all follow-up visits as told by your health care provider. This is important.     If you start to feel like you might faint, lie down right away and raise (elevate) your feet above the level of your heart. Breathe deeply and steadily. Wait until all of the symptoms have passed.     Drink enough fluid to keep your urine clear or pale yellow.     If you are taking blood pressure or heart medicine, get up slowly and take several minutes to sit and then stand. This can reduce dizziness.     Take over-the-counter and prescription medicines only as told by your health care provider.    SEEK IMMEDIATE MEDICAL CARE IF:     You have a severe headache.     You have unusual pain in your chest, abdomen, or back.     You are bleeding from your mouth or rectum, or you have black or tarry stool.     You have a very fast or irregular heartbeat (palpitations).     You have pain with breathing.     You faint once or repeatedly.     You have a seizure.     You are  confused.     You have trouble walking.     You have severe weakness.     You have vision problems.    These symptoms may represent a serious problem that is an emergency. Do not wait to see if your symptoms will go away. Get medical help right away. Call your local emergency services (911 in the [place] Do not drive yourself to the hospital.    This information is not intended to replace advice given to you by your health care provider. Make sure you discuss any questions you have with your health care provider.    Document Released: 07/30/2005 Document Revised: 07/11/2015 Document Reviewed: 04/13/2015  Elsevier Interactive Patient Education ?2016 Elsevier Inc.      Endocrinology     Hypocalcemia, Adult    Hypocalcemia is when the level of calcium in a person's blood is below normal. Calcium is a mineral that is used by the body in many ways. A lack of blood calcium can affect the heart and muscles, make the bones more likely to  break, and cause other problems.    CAUSES    This condition may be caused by:     Decreased production (hypoparathyroidism) or improper use of parathyroid hormone.     Problems with the parathyroid glands or surgical removal of these glands.     Problems with parathyroid function after removal of the thyroid gland.     Lack (deficiency) of vitamin D or magnesium or both.     Kidney problems.    Less common causes include:     Intestinal problems that interfere with nutrient absorption.     Alcoholism.     Low levels of a body protein that is called albumin.     Inflammation of the pancreas (pancreatitis).     Certain medicines.     Severe infections (sepsis).     Certain diseases, such as sarcoidosis or hemochromatosis, that cause the parathyroid glands to be filled with cells or substances that are not normally present.     Breakdown of large amounts of muscle fiber.     High levels of phosphate in the body.     Cancer.     Massive blood transfusions, which usually occur with severe  trauma.    SYMPTOMS    Symptoms of this condition include:     Numbness and tingling in the fingers, toes, or around the mouth.     Muscle aches or cramps, especially in the legs, feet, and back.     Muscle twitches.     Abdominal cramping or pain.     Memory problems, confusion, or difficulty thinking.     Depression, anxiety, irritability, or changes in personality.     Fainting.     Chest pain.     Difficulty swallowing.     Changes in the sound of the voice.     Shortness of breath or wheezing.     General weakness and fatigue.    Symptoms of severe hypocalcemia include:     Shaking uncontrollably (seizures).     Seizure of the voice box (laryngospasm).     Fast heartbeats (palpitations) and abnormal heart rhythms (arrhythmias).    Long-term symptoms of this condition include:     Coarse, brittle hair and nails.     Dry skin or lasting (chronic) skin diseases (psoriasis, eczema,, or dermatitis).     Clouding of the eye lens (cataracts).    DIAGNOSIS    This condition is usually diagnosed with a blood test. You may also have other tests to help determine the underlying cause of the condition. For example, a test may be done that records the electrical activity of the heart (electrocardiogram,or ECG).    TREATMENT    Treatment for this condition may include:     Calcium given by mouth (orally) or given through an IV tube that is inserted into one of your veins. The method used for giving calcium will depend on the severity of the condition.     Other minerals (electrolytes), such as magnesium.    Other treatment will depend on the cause of the condition.    HOME CARE INSTRUCTIONS     Follow diet instructions from your health care provider or dietitian.     Take supplements only as told by your health care provider.     Keep all follow-up visits as told by your health care provider. This is important.    SEEK MEDICAL CARE IF:     You have increased fatigue.  You have increased muscle twitching.     You have new  swelling in the feet, ankles, or legs.     You develop changes in mood, memory, or personality.    SEEK IMMEDIATE MEDICAL CARE IF:     You have chest pain.     You have persistent rapid or irregular heartbeats.     You have difficulty breathing.     You faint.     You start to have seizures.     You have confusion.    This information is not intended to replace advice given to you by your health care provider. Make sure you discuss any questions you have with your health care provider.    Document Released: 01/17/2010 Document Revised: 07/11/2015 Document Reviewed: 12/15/2014  Elsevier Interactive Patient Education ?2016 Elsevier Inc.      Obstetrics and Gynecology     Calcium Intake Recommendations    Calcium is a mineral that affects many functions in the body, including:     Blood clotting.     Blood vessel function.     Nerve impulse conduction.     Hormone secretion.     Muscle contraction.     Bone and teeth functions.     Most of your body's calcium supply is stored in your bones and teeth. When your calcium stores are low, you may be at risk for low bone mass, bone loss, and bone fractures. Consuming enough calcium helps to grow healthy bones and teeth and to prevent breakdown over time.     It is very important that you get enough calcium if you are:     A child undergoing rapid growth.     An adolescent girl.     A pre- or post-menopausal woman.     A woman whose menstrual cycle has stopped due to anorexia nervosa or regular intense exercise.     An individual with lactose intolerance or a milk allergy.     A vegetarian.    WHAT IS MY PLAN?    Try to consume the recommended amount of calcium daily based on your age. Depending on your overall health, your health care provider may recommend increased calcium intake.?General daily calcium intake recommendations by age are:     Birth to 6 months: 200 mg.     Infants 7 to 12 months: 260 mg.     Children 1 to 3 years: 700 mg.     Children 4 to 8 years: 1,000  mg.     Children 9 to 13 years: 1,300 mg.     Teens 14 to 18 years: 1,300 mg.     Adults 19 to 50 years: 1,000 mg.     Adult women 51 to 70 years: 1,200 mg.     Adult men 51 to 70 years: 1,000 mg.     Adults 71 years and older: 1,200 mg.     Pregnant and breastfeeding teens: 1,300 mg.     Pregnant and breastfeeding adults: 1,000 mg.    WHAT DO I NEED TO KNOW ABOUT CALCIUM INTAKE?     In order for the body to absorb calcium, it needs vitamin D. You can get vitamin D through:    ? Direct exposure of the skin to sunlight.    ? Foods, such as egg yolks, liver, saltwater fish, and fortified milk.     ? Supplements.      Consuming too much calcium may cause:    ?  Constipation.    ? Decreased absorption of iron and zinc.    ? Kidney stones.     Calcium supplements may interact with certain medicines. Check with your health care provider before starting any calcium supplements.      Try to get most of your calcium from food.     WHAT FOODS CAN I EAT?    Grains    Fortified oatmeal. Fortified ready-to-eat cereals. Fortified frozen waffles.    Vegetables    Turnip greens. Broccoli.     Fruits    Fortified orange juice.    Meats and Other Protein Sources    Canned sardines with bones. Canned salmon with bones. Soy beans. Tofu. Baked beans. Almonds. [place] nuts. Sunflower seeds.    Dairy    Milk. Yogurt. Cheese. Cottage cheese.     Beverages    Fortified soy milk. Fortified rice milk.     Sweets/Desserts    Pudding. Ice Cream. Milkshakes. Blackstrap molasses.    The items listed above may not be a complete list of recommended foods or beverages. Contact your dietitian for more options.     WHAT FOODS CAN AFFECT MY CALCIUM INTAKE?    It may be more difficult for your body to use calcium or calcium may leave your body more quickly if you consume large amounts of:      Sodium.     Protein.     Caffeine.     Alcohol.    This information is not intended to replace advice given to you by your health care provider. Make sure you  discuss any questions you have with your health care provider.    Document Released: 03/13/2004 Document Revised: 08/20/2014 Document Reviewed: 01/05/2014  Elsevier Interactive Patient Education ?2016 Elsevier Inc.

## 2020-05-10 NOTE — Discharge Summary (Signed)
Smoking Status      No Smoking Status Documented         PATIENT EDUCATION INFORMATION  Instructions:     Syncope; Hypocalcemia, Adult; Calcium Intake Recommendations     Follow up:                   With: Address: When:   Follow up with primary care provider  Within 1 week              ED PROVIDER DOCUMENTATION     Patient:   Erin Gill, Erin Gill             MRN: [id number]            FIN: [id number]               Age:   59 years     Sex:  Female     DOB:  [birth year]   Associated Diagnoses:   Syncope; Hypocalcemia   Author:   Governor Rooks B-MD      Basic Information   Time seen: Provider Seen (ST)   ED Provider/Time:    Governor Rooks B-MD / 05/09/2020 22:48  .   Additional information: Chief Complaint from Nursing Triage Note   Chief Complaint   No qualifying data available.[name]      History of Present Illness   The patient presents with syncope and 59 year old female history of right-sided heart failure is on constant infusion of velteri presents for evaluation of 2 episodes of syncope while sitting on the toilet.  Patient previous to this had developed nausea and vomiting dry heaves around 8 hours ago was at a mall when this occurred.  No chest pain no shortness of breath no abdominal pain no vomiting diarrhea.  Had another syncopal episode but a week ago at home under symptoms similar  circumstances.  Currently has no complaints.  This occurred just prior to arrival patient arrived by EMS.  The onset was just prior to arrival.  The course/duration of symptoms is resolved.  The location where the incident occurred was in the street.  The exacerbating factor is sitting.  The relieving factor is none.  Risk factors consist of hypertension.  Prior episodes: orthostasis.  Therapy today: emergency medical services.  Associated symptoms: nausea and vomiting.  Associated injury to the none.        Review of Systems   Constitutional symptoms:  No generalized weakness,    Cardiovascular symptoms:  Syncope, No chest pain,    Gastrointestinal symptoms:  Nausea, vomiting, No abdominal pain,    Musculoskeletal symptoms:  No back pain,    Neurologic symptoms:  No headache,              Additional review of systems information: All other systems reviewed and otherwise negative.      Health Status   Allergies:    Allergic Reactions (Selected)  Moderate  Penicillins- Rash..      Past Medical/ Family/ Social History   Medical history: Reviewed as documented in chart.   Surgical history: Reviewed as documented in chart.   Family history: Not significant.   Social history: Reviewed as documented in chart.   Problem list:    Active Problems (1)  CHF (congestive heart failure)   .      Physical Examination   Glasgow coma scale:  Total score: Total score: 15.   Neurological:  Alert and oriented to person, place, time, and situation, No focal neurological  Smoking Status      No Smoking Status Documented         PATIENT EDUCATION INFORMATION  Instructions:     Syncope; Hypocalcemia, Adult; Calcium Intake Recommendations     Follow up:                   With: Address: When:   Follow up with primary care provider  Within 1 week              ED PROVIDER DOCUMENTATION     Patient:   Erin Gill, Erin Gill             MRN: [id number]            FIN: [id number]               Age:   59 years     Sex:  Female     DOB:  [birth year]   Associated Diagnoses:   Syncope; Hypocalcemia   Author:   Governor Rooks B-MD      Basic Information   Time seen: Provider Seen (ST)   ED Provider/Time:    Governor Rooks B-MD / 05/09/2020 22:48  .   Additional information: Chief Complaint from Nursing Triage Note   Chief Complaint   No qualifying data available.[name]      History of Present Illness   The patient presents with syncope and 59 year old female history of right-sided heart failure is on constant infusion of velteri presents for evaluation of 2 episodes of syncope while sitting on the toilet.  Patient previous to this had developed nausea and vomiting dry heaves around 8 hours ago was at a mall when this occurred.  No chest pain no shortness of breath no abdominal pain no vomiting diarrhea.  Had another syncopal episode but a week ago at home under symptoms similar  circumstances.  Currently has no complaints.  This occurred just prior to arrival patient arrived by EMS.  The onset was just prior to arrival.  The course/duration of symptoms is resolved.  The location where the incident occurred was in the street.  The exacerbating factor is sitting.  The relieving factor is none.  Risk factors consist of hypertension.  Prior episodes: orthostasis.  Therapy today: emergency medical services.  Associated symptoms: nausea and vomiting.  Associated injury to the none.        Review of Systems   Constitutional symptoms:  No generalized weakness,    Cardiovascular symptoms:  Syncope, No chest pain,    Gastrointestinal symptoms:  Nausea, vomiting, No abdominal pain,    Musculoskeletal symptoms:  No back pain,    Neurologic symptoms:  No headache,              Additional review of systems information: All other systems reviewed and otherwise negative.      Health Status   Allergies:    Allergic Reactions (Selected)  Moderate  Penicillins- Rash..      Past Medical/ Family/ Social History   Medical history: Reviewed as documented in chart.   Surgical history: Reviewed as documented in chart.   Family history: Not significant.   Social history: Reviewed as documented in chart.   Problem list:    Active Problems (1)  CHF (congestive heart failure)   .      Physical Examination   Glasgow coma scale:  Total score: Total score: 15.   Neurological:  Alert and oriented to person, place, time, and situation, No focal neurological  ED Clinical Summary                        [facility]  [address]  [place], [place], [address]  [phone]           PERSON INFORMATION  Name: Erin Gill, Erin Gill Age:  40 Years DOB: 1961/04/30   Sex: Female Language:  PCP: PCP,  NONE   Marital Status:  Phone:  Med Service: [facility]   MRN: [id number] Acct# [id number] Arrival: 05/09/2020 22:44:00   Visit Reason: Syncope/Near syncope; SYNCOPE Acuity: 3 LOS: 000 01:50   Address:    [address] [place] [address]   Diagnosis:    Hypocalcemia; Syncope  Medications:          New Medications  Printed Prescriptions  calcium carbonate (calcium (as carbonate) 600 mg oral tablet) 1 Tabs Oral (given by mouth) 2 times a day. Refills: 0., " NOTE 1250MG  OF CALCIUM  CARBONATE IS EQUIVALENT TO 500MG  OF  ELEMENTAL CALCIUM"  Last Dose:____________________  ondansetron (Zofran 4 mg oral tablet) 1 Tabs Oral (given by mouth) every 4 hours as needed nausea for 3 Days. Refills: 0.  Last Dose:____________________      Medications Administered During Visit:                Medication Dose Route   calcium carbonate 500 mg Oral   ondansetron 4 mg Oral               Allergies      penicillins (Rash)      Major Tests and Procedures:  The following procedures and tests were performed during your ED visit.  COMMON PROCEDURES%>  COMMON PROCEDURES COMMENTS%>                PROVIDER INFORMATION               Provider Role Assigned Verlin Grills, [place] B-MD ED Provider 05/09/2020 22:48:12    Dawson Bills P-RN ED Nurse 05/09/2020 22:56:26        Attending Physician:  DEFAULT,  DOCTOR      Admit Doc  DEFAULT,  DOCTOR     Consulting Doc       VITALS INFORMATION  Vital Sign Triage Latest   Temp Oral ORAL_1%> ORAL%>   Temp Temporal TEMPORAL_1%> TEMPORAL%>   Temp Intravascular INTRAVASCULAR_1%> INTRAVASCULAR%>   Temp Axillary AXILLARY_1%> AXILLARY%>   Temp Rectal RECTAL_1%> RECTAL%>   02 Sat 98 % 99 %   Respiratory Rate RATE_1%> RATE%>   Peripheral Pulse Rate PULSE RATE_1%>  PULSE RATE%>   Apical Heart Rate HEART RATE_1%> HEART RATE%>   Blood Pressure BLOOD PRESSURE_1%>/ BLOOD PRESSURE_1%>84 mmHg BLOOD PRESSURE%> / BLOOD PRESSURE%>78 mmHg                 Immunizations      No Immunizations Documented This Visit          DISCHARGE INFORMATION   Discharge Disposition: H Outpt-Sent Home   Discharge Location:  Home   Discharge Date and Time:  05/10/2020 00:34:34   ED Checkout Date and Time:  05/10/2020 00:34:34     DEPART REASON INCOMPLETE INFORMATION               Depart Action Incomplete Reason   Interactive View/I&O Recently assessed               Problems      No Problems Documented  ED Clinical Summary                        [facility]  [address]  [place], [place], [address]  [phone]           PERSON INFORMATION  Name: Erin Gill, Erin Gill Age:  40 Years DOB: 1961/04/30   Sex: Female Language:  PCP: PCP,  NONE   Marital Status:  Phone:  Med Service: [facility] Nurs[facility]   MRN: [id number] Acct# [id number] Arrival: 05/09/2020 22:44:00   Visit Reason: Syncope/Near syncope; SYNCOPE Acuity: 3 LOS: 000 01:50   Address:    [address] [place] [address]   Diagnosis:    Hypocalcemia; Syncope  Medications:          New Medications  Printed Prescriptions  calcium carbonate (calcium (as carbonate) 600 mg oral tablet) 1 Tabs Oral (given by mouth) 2 times a day. Refills: 0., " NOTE 1250MG  OF CALCIUM  CARBONATE IS EQUIVALENT TO 500MG  OF  ELEMENTAL CALCIUM"  Last Dose:____________________  ondansetron (Zofran 4 mg oral tablet) 1 Tabs Oral (given by mouth) every 4 hours as needed nausea for 3 Days. Refills: 0.  Last Dose:____________________      Medications Administered During Visit:                Medication Dose Route   calcium carbonate 500 mg Oral   ondansetron 4 mg Oral               Allergies      penicillins (Rash)      Major Tests and Procedures:  The following procedures and tests were performed during your ED visit.  COMMON PROCEDURES%>  COMMON PROCEDURES COMMENTS%>                PROVIDER INFORMATION               Provider Role Assigned Verlin Grills, [place] B-MD ED Provider 05/09/2020 22:48:12    Dawson Bills P-RN ED Nurse 05/09/2020 22:56:26        Attending Physician:  DEFAULT,  DOCTOR      Admit Doc  DEFAULT,  DOCTOR     Consulting Doc       VITALS INFORMATION  Vital Sign Triage Latest   Temp Oral ORAL_1%> ORAL%>   Temp Temporal TEMPORAL_1%> TEMPORAL%>   Temp Intravascular INTRAVASCULAR_1%> INTRAVASCULAR%>   Temp Axillary AXILLARY_1%> AXILLARY%>   Temp Rectal RECTAL_1%> RECTAL%>   02 Sat 98 % 99 %   Respiratory Rate RATE_1%> RATE%>   Peripheral Pulse Rate PULSE RATE_1%>  PULSE RATE%>   Apical Heart Rate HEART RATE_1%> HEART RATE%>   Blood Pressure BLOOD PRESSURE_1%>/ BLOOD PRESSURE_1%>84 mmHg BLOOD PRESSURE%> / BLOOD PRESSURE%>78 mmHg                 Immunizations      No Immunizations Documented This Visit          DISCHARGE INFORMATION   Discharge Disposition: H Outpt-Sent Home   Discharge Location:  Home   Discharge Date and Time:  05/10/2020 00:34:34   ED Checkout Date and Time:  05/10/2020 00:34:34     DEPART REASON INCOMPLETE INFORMATION               Depart Action Incomplete Reason   Interactive View/I&O Recently assessed               Problems      No Problems Documented

## 2020-05-17 ENCOUNTER — Telehealth: Payer: Self-pay

## 2020-05-17 ENCOUNTER — Other Ambulatory Visit: Payer: Self-pay | Admitting: "Endocrinology

## 2020-05-17 ENCOUNTER — Telehealth: Payer: Self-pay | Admitting: "Endocrinology

## 2020-05-17 DIAGNOSIS — E876 Hypokalemia: Secondary | ICD-10-CM

## 2020-05-17 DIAGNOSIS — I10 Essential (primary) hypertension: Secondary | ICD-10-CM

## 2020-05-17 NOTE — Telephone Encounter (Signed)
I ordered those labs for her.

## 2020-05-17 NOTE — Telephone Encounter (Signed)
Patient called and states she has not felt good and would like Dr. Dorris Fetch to order labs to check her potassium and calcium. If so she would like the [facility] to [facility].

## 2020-05-17 NOTE — Telephone Encounter (Signed)
Please advise, it looks like she has also called her PCP to ask for the same thing.

## 2020-05-17 NOTE — Telephone Encounter (Signed)
Yes please order non fasting chem 7 and EGFR, calcium to be included and magnesium and a cBC, thanks

## 2020-05-17 NOTE — Telephone Encounter (Signed)
Last checked July 30. Do you want to reorder?

## 2020-05-17 NOTE — Telephone Encounter (Signed)
Tried to contact pt and let her know. Unable to reach.

## 2020-05-17 NOTE — Telephone Encounter (Signed)
Pt is calling to confirm appt, and wants to k now if Dr Moshe Cipro will order labs to check her Potassium & Calcium--Please call the pt to advise

## 2020-05-18 NOTE — Telephone Encounter (Signed)
Called pt, no answer, left voicemail.

## 2020-05-19 ENCOUNTER — Encounter (HOSPITAL_COMMUNITY): Payer: 59

## 2020-05-23 ENCOUNTER — Other Ambulatory Visit: Payer: Self-pay

## 2020-05-23 DIAGNOSIS — D638 Anemia in other chronic diseases classified elsewhere: Secondary | ICD-10-CM

## 2020-05-23 DIAGNOSIS — R5382 Chronic fatigue, unspecified: Secondary | ICD-10-CM

## 2020-05-23 DIAGNOSIS — I10 Essential (primary) hypertension: Secondary | ICD-10-CM

## 2020-05-23 LAB — COMPREHENSIVE METABOLIC PANEL
AG Ratio: 1.1 (calc) (ref 1.0–2.5)
ALT: 10 U/L (ref 6–29)
AST: 15 U/L (ref 10–35)
Albumin: 4 g/dL (ref 3.6–5.1)
Alkaline phosphatase (APISO): 55 U/L (ref 37–153)
BUN/Creatinine Ratio: 19 (calc) (ref 6–22)
BUN: 34 mg/dL — ABNORMAL HIGH (ref 7–25)
CO2: 19 mmol/L — ABNORMAL LOW (ref 20–32)
Calcium: 6.6 mg/dL — ABNORMAL LOW (ref 8.6–10.4)
Chloride: 101 mmol/L (ref 98–110)
Creat: 1.77 mg/dL — ABNORMAL HIGH (ref 0.50–1.05)
Globulin: 3.5 g/dL (calc) (ref 1.9–3.7)
Glucose, Bld: 118 mg/dL — ABNORMAL HIGH (ref 65–99)
Potassium: 4.2 mmol/L (ref 3.5–5.3)
Sodium: 140 mmol/L (ref 135–146)
Total Bilirubin: 0.4 mg/dL (ref 0.2–1.2)
Total Protein: 7.5 g/dL (ref 6.1–8.1)

## 2020-05-23 LAB — T4, FREE: Free T4: 1.1 ng/dL (ref 0.8–1.8)

## 2020-05-23 LAB — TSH: TSH: 5.33 mIU/L — ABNORMAL HIGH (ref 0.40–4.50)

## 2020-05-23 NOTE — Telephone Encounter (Signed)
Labs have been ordered

## 2020-05-24 ENCOUNTER — Encounter: Payer: Self-pay | Admitting: Family Medicine

## 2020-05-24 ENCOUNTER — Ambulatory Visit (INDEPENDENT_AMBULATORY_CARE_PROVIDER_SITE_OTHER): Payer: 59 | Admitting: Family Medicine

## 2020-05-24 ENCOUNTER — Other Ambulatory Visit: Payer: Self-pay

## 2020-05-24 VITALS — BP 92/60 | HR 82 | Temp 98.6°F | Resp 16 | Ht 60.0 in | Wt 188.0 lb

## 2020-05-24 DIAGNOSIS — Z0000 Encounter for general adult medical examination without abnormal findings: Secondary | ICD-10-CM | POA: Diagnosis not present

## 2020-05-24 DIAGNOSIS — Z23 Encounter for immunization: Secondary | ICD-10-CM

## 2020-05-24 DIAGNOSIS — H547 Unspecified visual loss: Secondary | ICD-10-CM | POA: Diagnosis not present

## 2020-05-24 DIAGNOSIS — D126 Benign neoplasm of colon, unspecified: Secondary | ICD-10-CM

## 2020-05-24 DIAGNOSIS — I10 Essential (primary) hypertension: Secondary | ICD-10-CM

## 2020-05-24 MED ORDER — ERGOCALCIFEROL 1.25 MG (50000 UT) PO CAPS: ORAL_CAPSULE | ORAL | 1 refills | Status: DC

## 2020-05-24 NOTE — Patient Instructions (Addendum)
F/U in office with MD in 6 months, call if you need me sooner  Flu vaccine today  Fasting lipid, cmp and EGFR, and Vit D 1 week before next visit   Take an additional three calcium tablets today if youhave not got specific directions from Dr Dorris Fetch by the end of the day, your calcium is low  Reduce vit D to one capsule every 2 weeks  You are referred to Dr Hilarie Fredrickson for colonoscopy which is past due  You are referred for an eye exam due to poor vision  It is important that you exercise regularly at least 30 minutes 5 times a week. If you develop chest pain, have severe difficulty breathing, or feel very tired, stop exercising immediately and seek medical attention  Think about what you will eat, plan ahead. Choose " clean, green, fresh or frozen" over canned, processed or packaged foods which are more sugary, salty and fatty. 70 to 75% of food eaten should be vegetables and fruit. Three meals at set times with snacks allowed between meals, but they must be fruit or vegetables. Aim to eat over a 12 hour period , example 7 am to 7 pm, and STOP after  your last meal of the day. Drink water,generally about 64 ounces per day, no other drink is as healthy. Fruit juice is best enjoyed in a healthy way, by EATING the fruit. Thanks for choosing [facility], we consider it a privelige to serve you.

## 2020-05-25 ENCOUNTER — Telehealth: Payer: Self-pay

## 2020-05-25 NOTE — Telephone Encounter (Signed)
Called pt to get her on the schedule for a phone visit today per Dr Dorris Fetch. VM full on cell phone, left VM at home phone.

## 2020-05-28 ENCOUNTER — Encounter: Payer: Self-pay | Admitting: Family Medicine

## 2020-05-28 NOTE — Assessment & Plan Note (Signed)

## 2020-05-28 NOTE — Progress Notes (Signed)
    Sabrina [name]     MRN: [id number]      DOB: [birth year]  HPI: Patient is in for annual physical exam. Has recurrent hypocalcemia, currently under corrected, will reach out to Endo who is treating her Medications are reviewed and generally health updated. Much improved as far as breathing is concerned with parenteral medication continually Recent labs, if available are reviewed. Immunization is reviewed , and  updated    PE: BP 92/60   Pulse 82   Temp 98.6 F (37 C)   Resp 16   Ht 5' (1.524 m)   Wt 188 lb (85.3 kg)   LMP 10/31/2002   SpO2 95%   BMI 36.72 kg/m   Pleasant  female, alert and oriented x 3, in no cardio-pulmonary distress. Afebrile. HEENT No facial trauma or asymetry. Sinuses non tender.  Extra occullar muscles intact.. External ears normal, . Neck: supple, no adenopathy,JVD or thyromegaly.No bruits.  Chest: Clear to ascultation bilaterally.No crackles or wheezes. Non tender to palpation  Breast: Normal mammogram 03/2020  Cardiovascular system; Heart sounds normal,  S1 and  S2 ,no S3.  No murmur, or thrill.  Peripheral pulses normal.  Abdomen: Soft, non tender, No guarding, tenderness or rebound.    Musculoskeletal exam: Full ROM of spine, hips , shoulders and knees. No deformity ,swelling or crepitus noted. No muscle wasting or atrophy.   Neurologic: Cranial nerves 2 to 12 intact. Power, tone ,sensation and reflexes normal throughout. No disturbance in gait. No tremor.  Skin: Intact,hypo and hyperpigmentation noted, macular rash  Psych; Normal mood and affect. Judgement and concentration normal   Assessment & Plan:  Annual physical exam Annual exam as documented. Counseling done  re healthy lifestyle involving commitment to 150 minutes exercise per week, heart healthy diet, and attaining healthy weight.The importance of adequate sleep also discussed. Regular seat belt use and home safety, is also discussed. Changes in  health habits are decided on by the patient with goals and time frames  set for achieving them. Immunization and cancer screening needs are specifically addressed at this visit.

## 2020-06-29 ENCOUNTER — Encounter: Payer: Self-pay | Admitting: Internal Medicine

## 2020-07-26 LAB — BASIC METABOLIC PANEL
BUN: 27 — AB (ref 4–21)
Creatinine: 1.8 — AB (ref 0.5–1.1)

## 2020-07-26 LAB — COMPREHENSIVE METABOLIC PANEL
Calcium: 7.3 — AB (ref 8.7–10.7)
GFR calc non Af Amer: 31

## 2020-07-26 LAB — TSH: TSH: 5.88 (ref 0.41–5.90)

## 2020-07-27 ENCOUNTER — Telehealth: Payer: Self-pay | Admitting: *Deleted

## 2020-07-27 NOTE — Telephone Encounter (Signed)
Hi John,  Will you take a look at this patient and see if she should have an office visit first to consider doing at the hospital.  While she meets criteria with echo, her history is lengthy. Thank you!

## 2020-07-27 NOTE — Telephone Encounter (Signed)
Sabrina [name],  Since this pt is being w/u for syncope and SOB she will need cardiac clearance and probably a OV.  Thanks,  Osvaldo Angst

## 2020-07-28 ENCOUNTER — Telehealth: Payer: Self-pay

## 2020-07-28 NOTE — Telephone Encounter (Addendum)
Per TE by Vena Rua see TE 12/15, pt needs OV and cardiace clearance before procedure can be done.  Called and left VM at  preferred number.  PV and procedure cancelled

## 2020-07-28 NOTE — Telephone Encounter (Signed)
Attempted to reach pt to schedule office visit per Vena Rua instructions-LM requesting return call.

## 2020-08-17 NOTE — Telephone Encounter (Signed)
OV scheduled for 09/15/20

## 2020-08-24 ENCOUNTER — Other Ambulatory Visit: Payer: Self-pay | Admitting: "Endocrinology

## 2020-08-26 ENCOUNTER — Encounter: Payer: 59 | Admitting: Internal Medicine

## 2020-09-15 ENCOUNTER — Ambulatory Visit: Payer: 59 | Admitting: "Endocrinology

## 2020-09-21 ENCOUNTER — Encounter: Payer: Self-pay | Admitting: "Endocrinology

## 2020-09-21 ENCOUNTER — Other Ambulatory Visit: Payer: Self-pay

## 2020-09-21 ENCOUNTER — Ambulatory Visit (INDEPENDENT_AMBULATORY_CARE_PROVIDER_SITE_OTHER): Payer: 59 | Admitting: "Endocrinology

## 2020-09-21 DIAGNOSIS — E039 Hypothyroidism, unspecified: Secondary | ICD-10-CM | POA: Diagnosis not present

## 2020-09-21 DIAGNOSIS — E876 Hypokalemia: Secondary | ICD-10-CM | POA: Diagnosis not present

## 2020-09-21 MED ORDER — LEVOTHYROXINE SODIUM 88 MCG PO TABS: 88.0000 ug | ORAL_TABLET | Freq: Every day | ORAL | 1 refills | Status: AC

## 2020-09-21 NOTE — Progress Notes (Signed)
09/21/2020     Endocrinology follow-up note                    Subjective:    Patient ID: Sabrina [name], female    DOB: April 04, 1961, PCP Fayrene Helper, MD   Past Medical History:  Diagnosis Date  . Acute on chronic congestive heart failure ([place])   . Acute on chronic diastolic (congestive) heart failure ([place]) 08/17/2019  . Arthritis   . Asthma   . Chronic bronchitis ([place])   . Encounter for long-term (current) use of other medications 01/14/2015  . Endometriosis   . Essential hypertension   . Fibroid uterus   . GERD (gastroesophageal reflux disease)   . History of hiatal hernia   . History of nuclear stress test    a. Myoview 6/16: There is no resting or stress perfusion defect consistent with no prior infarct and no ischemia. EF 73 %  The patient was hypertensive prior and during the study. Cardiac cath 04/2016 showed normal coronary arteries  . Hx of iron deficiency anemia   . Hypersomnia with sleep apnea   . Hypocalcemia 07/07/2016  . Hypomagnesemia 06/05/2019  . Hypotension   . Interstitial lung disease ([place])   . Iron deficiency anemia 09/11/2014   Overview:  Last Assessment & Plan:  Had colonoscopy in 07/2013. Needs to ensure she is taking iron 325mg  twice daily, will f/u in 4 months, if still low needs Heme consult as may need IV iron, she has a hysterectomy  . Ischemia of digits of hand 05/17/2018  . Long Q-T syndrome    On beta-blocker  . Menorrhagia   . Obesity   . PFO (patent foramen ovale)   . Pneumonia   . Precordial pain   . Pulmonary arterial hypertension ([place])   . 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 Scleroderma ([place])   . Seasonal allergies 01/05/2015   Overview:  Last Assessment & Plan:  Needs to commit to daily use of appropriate medication, astelin and nasonex prescribed and singular also  . Secondary hyperthyroidism   . Severe sepsis ([place]) 08/17/2019  . SIRS (systemic inflammatory response syndrome) (H[facility])    . Tinnitus    Left  . Tubular adenoma of colon   . Type 2 diabetes mellitus ([place])   . Upper airway cough syndrome 02/04/2018  . Vertigo    Past Surgical History:  Procedure Laterality Date  . ABDOMINAL HYSTERECTOMY  2004   supracervical hysterectomy  . BREAST SURGERY  2005   reduction  . BREATH TEK H PYLORI [name] 12/24/2014   Procedure: BREATH TEK H PYLORI;  Surgeon: Greer Pickerel, MD;  Location: [facility];  Service: General;  Laterality: [name];  . CARDIAC CATHETERIZATION [name] 03/22/2016   Procedure: Right/Left Heart Cath and Coronary Angiography;  Surgeon: Troy Sine, MD;  Location: [facility];  Service: Cardiovascular;  Laterality: [name];  . DILATION AND CURETTAGE OF UTERUS     age 60  . ESOPHAGOGASTRODUODENOSCOPY (EGD) WITH PROPOFOL [name] 10/14/2015   Procedure: ESOPHAGOGASTRODUODENOSCOPY (EGD) WITH PROPOFOL;  Surgeon: Clarene Essex, MD;  Location: [facility];  Service: Endoscopy;  Laterality: [name];  . LAPAROSCOPIC GASTRIC SLEEVE RESECTION WITH HIATAL HERNIA REPAIR [name] 07/05/2015   Procedure: LAPAROSCOPIC GASTRIC SLEEVE RESECTION WITH HIATAL HERNIA REPAIR, upper endoscopy;  Surgeon: Greer Pickerel, MD;  Location: [facility];  Service: General;  Laterality: [name];  . REDUCTION MAMMAPLASTY Bilateral   . RIGHT HEART CATH [name] 10/13/2018  Procedure: RIGHT HEART CATH;  Surgeon: Larey Dresser, MD;  Location: [facility];  Service: Cardiovascular;  Laterality: [name];  . RIGHT HEART CATH [name] 08/19/2019   Procedure: RIGHT HEART CATH;  Surgeon: Larey Dresser, MD;  Location: [facility];  Service: Cardiovascular;  Laterality: [name];   Social History   Socioeconomic History  . Marital status: Married    Spouse name: Not on file  . Number of children: Not on file  . Years of education: 58  . Highest education level: Not on file  Occupational History  . Occupation: Therapist, music: [employer]  Tobacco Use  . Smoking status: Never Smoker  . Smokeless  tobacco: Never Used  Vaping Use  . Vaping Use: Never used  Substance and Sexual Activity  . Alcohol use: No    Alcohol/week: 0.0 standard drinks  . Drug use: No  . Sexual activity: Yes    Partners: Male    Birth control/protection: Surgical    Comment: hysterectomy (supracervical)  Other Topics Concern  . Not on file  Social History Narrative   Regular exercise-no   Caffeine Use-no      [facility]: Patient lives with her husband. Works for [employer]. No bird or mold exposure. No pets currently.         Social Determinants of Health   Financial Resource Strain: Not on file  Food Insecurity: Not on file  Transportation Needs: Not on file  Physical Activity: Not on file  Stress: Not on file  Social Connections: Not on file   Outpatient Encounter Medications as of 09/21/2020  Medication Sig  . acetaminophen (TYLENOL) 325 MG tablet Take 2 tablets (650 mg total) by mouth every 6 (six) hours as needed for mild pain (or Fever >/= 101).  . calcium carbonate (OS-CAL - DOSED IN MG OF ELEMENTAL CALCIUM) 1250 (500 Ca) MG tablet Take 3 tablets (1,500 mg of elemental calcium total) by mouth 3 (three) times daily with meals.  [name] epoprostenol (VELETRI) 1.5 MG injection Inject into the vein.  . ergocalciferol (VITAMIN D2) 1.25 MG (50000 UT) capsule Take one capsule by mouth every 2 weeks  . levothyroxine (SYNTHROID) 88 MCG tablet Take 1 tablet (88 mcg total) by mouth daily before breakfast.  . Magnesium Oxide 250 MG TABS Take 1 tablet by mouth daily with breakfast.  . midodrine (PROAMATINE) 5 MG tablet Take 3 tablets (15 mg total) by mouth 3 (three) times daily with meals.  . Multiple Vitamin (MULTIVITAMIN WITH MINERALS) TABS tablet Take 1 tablet by mouth daily.  . mycophenolate (MYFORTIC) 360 MG TBEC EC tablet Take 720 mg by mouth 2 (two) times daily.   . pantoprazole (PROTONIX) 20 MG tablet 1 TABLET EACH MORNING, MAY TAKE ADDITIONAL DOSE IN THE EVENING IF NEEDED FOR  HEARTBURN/INDIGESTION.  [name] senna-docusate (SENOKOT-S) 8.6-50 MG tablet Take 2 tablets by mouth at bedtime.  . [DISCONTINUED] levothyroxine (SYNTHROID) 75 MCG tablet Take 1 tablet (75 mcg total) by mouth daily before breakfast.  . [DISCONTINUED] potassium chloride (K-DUR) 10 MEQ tablet Take 2 tablets by mouth twice daily   No facility-administered encounter medications on file as of 09/21/2020.   ALLERGIES: Allergies  Allergen Reactions  . Peanut Oil Swelling  . Potassium Iodide Other (See Comments)    facial swelling, also eyes, and ears  . Ace Inhibitors Cough  . Doxycycline Hives  . Peanut-Containing Drug Products Nausea And Vomiting  . Chlorhexidine Hives and Itching  .  Amlodipine Rash  . Codeine Nausea And Vomiting  . Latex Hives and Rash  . Penicillins Hives, Itching and Rash    .Marland KitchenHas patient had a PCN reaction causing immediate rash, facial/tongue/throat swelling, SOB or lightheadedness with hypotension: No Has patient had a PCN reaction causing severe rash involving mucus membranes or skin necrosis: No Has patient had a PCN reaction that required hospitalization No Has patient had a PCN reaction occurring within the last 10 years: YES (3 years ago)  If all of the above answers are "NO", then may proceed with Cephalosporin use.     VACCINATION STATUS: Immunization History  Administered Date(s) Administered  . Influenza Split 06/22/2011, 05/13/2018  . Influenza Whole 07/11/2006  . Influenza,inj,Quad PF,6+ Mos 06/26/2013, 05/11/2014, 06/13/2015, 05/20/2017, 05/24/2020  . Influenza-Unspecified 06/26/2013, 05/11/2014, 06/13/2015, 06/06/2016, 05/20/2017  . Moderna Sars-Covid-2 Vaccination 04/06/2020  . Pneumococcal Conjugate-13 02/07/2015  . Pneumococcal Polysaccharide-23 06/26/2013  . Td 04/11/2004  . Tdap 05/09/2015    HPI   60 year old female patient with medical history as above. -She is being seen in follow-up for hypercalcemia, hypothyroidism, hypokalemia,  hypomagnesemia.    She is on multiple supplements for electrolytes and thyroid hormone. -She is known to have PTH independent hypocalcemia for at least 6 years.  She remains  on a regular supplement with high-dose calcium, magnesium, potassium, and vitamin D.    Although she had a series of hospitalizations for electrolyte abnormalities in the past, she did not have to go to ER in recent month.   Her previsit labs show calcium 7.3. she has hypoalbuminemia of 3.4, making her corrected calcium is 7.8.  She is on calcium carbonate 1250 mg 3 tablets p.o. 3 times daily AC.  She is also on potassium supplement for hypokalemia as well as magnesium supplement for hypomagnesemia.  She does not report any new symptoms today.  -The exact etiology for her multiple electrolyte abnormalities is not settled.  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   -She has history of multinodular goiter with biopsy of one of her nodules consistent with hyperblastic nodule.  Repeat thyroid ultrasound on March 13, 2017 was remarkable for another left lobe nodule 1.5 cm with benign features. She did not show up for her appointment for surveillance thyroid ultrasound.   -She is on levothyroxine 75 mcg p.o. daily before breakfast.  She reports compliance with this medication -She denies dysphagia, shortness of breath, voice change. - She has a history of prolonged QT interval.  She has no new complaints today.  Review of Systems  Limited as above.  Objective:    BP 100/68   Pulse 76   Ht 5' (1.524 m)   Wt 179 lb 12.8 oz (81.6 kg)   LMP 10/31/2002   BMI 35.11 kg/m   Wt Readings from Last 3 Encounters:  09/21/20 179 lb 12.8 oz (81.6 kg)  05/24/20 188 lb (85.3 kg)  03/15/20 182 lb 6.4 oz (82.7 kg)      Physical Exam- Limited    CMP     Component Value Date/Time   NA 140 05/23/2020 0722   NA 142 09/30/2018 1409   K 4.2 05/23/2020 0722   CL 101 05/23/2020 0722   CO2 19  (L) 05/23/2020 0722   GLUCOSE 118 (H) 05/23/2020 0722   BUN 27 (A) 07/26/2020 0000   CREATININE 1.8 (A) 07/26/2020 0000   CREATININE 1.77 (H) 05/23/2020 0722   CALCIUM 7.3 (A) 07/26/2020 0000   CALCIUM 7.8 (L) 12/02/2008 2239  PROT 7.5 05/23/2020 0722   ALBUMIN 3.6 08/17/2019 0421   AST 15 05/23/2020 0722   ALT 10 05/23/2020 0722   ALKPHOS 82 08/17/2019 0421   BILITOT 0.4 05/23/2020 0722   GFRNONAA 31 07/26/2020 0000   GFRNONAA 42 (L) 03/11/2020 0946   GFRAA 49 (L) 03/11/2020 0946     Diabetic Labs (most recent): Lab Results  Component Value Date   HGBA1C 5.3 12/10/2019   HGBA1C 6.2 (H) 04/22/2019   HGBA1C 5.7 (H) 12/23/2017     Lipid Panel ( most recent) Lipid Panel     Component Value Date/Time   CHOL 166 12/23/2017 0738   TRIG 109 12/23/2017 0738   HDL 35 (L) 12/23/2017 0738   CHOLHDL 4.7 12/23/2017 0738   VLDL 18 12/11/2016 0743   LDLCALC 109 (H) 12/23/2017 0738    Thyroid ultrasound on March 07, 2020 IMPRESSION: 1. Slight interval growth of the previously biopsied nodule in the thyroid isthmus. Recommend correlation with prior biopsy results. 2. Continued stability of left mid thyroid nodule which was previously declared benign after demonstrating greater than 5 year stability. 3. No new or suspicious thyroid nodules.  Assessment & Plan:   1. 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  Not due to hypoparathyroidism.  She has elevated PTH appropriately in response to hypocalcemia.  -She has maintained low normal calcium, currently 7.3, corrected calcium 7.8, asymptomatic.    -Her previsit labs show stable potassium of 3.9.   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  -She is going to continue multiple electrolyte supplements at high dose.  She is advised to continue calcium carbonate  1250 mg (500 mg elemental calcium), 3  tablets 3 times a day with meals.   This will supplement 4500 mg of elemental calcium daily. -She is advised to continue magnesium oxide 250 mg p.o. daily with breakfast. -She is advised to continue her potassium supplement at 20 mEq twice a day.      -She is advised to call clinic if she develops symptoms of hypocalcemia including cramps, tingling,.   2. hypothyroidism -Her recent thyroid function tests are consistent with inadequate replacement.  She would benefit from slight increase in her levothyroxine dose.  I discussed and prescribed levothyroxine 88 mcg p.o. daily before breakfast.       - We discussed about the correct intake of her thyroid hormone, on empty stomach at fasting, with water, separated by at least 30 minutes from breakfast and other medications,  and separated by more than 4 hours from calcium, iron, multivitamins, acid reflux medications (PPIs). -Patient is made aware of the fact that thyroid hormone replacement is needed for life, dose to be adjusted by periodic monitoring of thyroid function tests.   3.  Nodular goiter -Her thyroid ultrasound from August 2018 was remarkable for stable isthmus nodule at 1.8 cm and new 1.5 cm nodule on the left lobe with benign and non- suspicious features.  -Her previsit thyroid ultrasound is unremarkable.  No new findings.  She does not need any intervention at this time.    4.  Type 2 diabetes-controlled with point-of-care A1c of 5.3% today.   Not on medications currently.    - I advised patient to maintain close follow up with Fayrene Helper, MD for primary care needs.     - Time spent on this patient care encounter:  30 minutes of which 50% was spent in  counseling and the rest reviewing  her current and  previous labs / studies and medications  doses and developing a plan for long term care. Amaka Gluth Freeman  participated in the discussions, expressed understanding, and voiced agreement with the above plans.  All questions were answered to her  satisfaction. she is encouraged to contact clinic should she have any questions or concerns prior to her return visit.   Follow up plan: Return in about 6 months (around 03/21/2021) for F/U with Pre-visit Labs.  Glade Lloyd, MD Phone: [phone]  Fax: [phone]   09/21/2020, 1:26 PM

## 2020-11-15 ENCOUNTER — Other Ambulatory Visit: Payer: Self-pay | Admitting: Family Medicine

## 2020-11-22 ENCOUNTER — Encounter: Payer: Self-pay | Admitting: Family Medicine

## 2020-11-22 ENCOUNTER — Other Ambulatory Visit: Payer: Self-pay

## 2020-11-22 ENCOUNTER — Ambulatory Visit (INDEPENDENT_AMBULATORY_CARE_PROVIDER_SITE_OTHER): Payer: 59 | Admitting: Family Medicine

## 2020-11-22 VITALS — BP 92/59 | HR 90 | Temp 97.5°F | Ht 60.0 in | Wt 180.0 lb

## 2020-11-22 DIAGNOSIS — F322 Major depressive disorder, single episode, severe without psychotic features: Secondary | ICD-10-CM | POA: Diagnosis not present

## 2020-11-22 DIAGNOSIS — D126 Benign neoplasm of colon, unspecified: Secondary | ICD-10-CM

## 2020-11-22 DIAGNOSIS — I5032 Chronic diastolic (congestive) heart failure: Secondary | ICD-10-CM

## 2020-11-22 DIAGNOSIS — K0510 Chronic gingivitis, plaque induced: Secondary | ICD-10-CM | POA: Diagnosis not present

## 2020-11-22 DIAGNOSIS — Z1231 Encounter for screening mammogram for malignant neoplasm of breast: Secondary | ICD-10-CM | POA: Diagnosis not present

## 2020-11-22 DIAGNOSIS — K219 Gastro-esophageal reflux disease without esophagitis: Secondary | ICD-10-CM

## 2020-11-22 DIAGNOSIS — J849 Interstitial pulmonary disease, unspecified: Secondary | ICD-10-CM

## 2020-11-22 DIAGNOSIS — N2581 Secondary hyperparathyroidism of renal origin: Secondary | ICD-10-CM

## 2020-11-22 DIAGNOSIS — M349 Systemic sclerosis, unspecified: Secondary | ICD-10-CM

## 2020-11-22 DIAGNOSIS — E038 Other specified hypothyroidism: Secondary | ICD-10-CM

## 2020-11-22 DIAGNOSIS — I749 Embolism and thrombosis of unspecified artery: Secondary | ICD-10-CM

## 2020-11-22 MED ORDER — DIPHENHYDRAMINE HCL 25 MG PO TABS: 25.0000 mg | ORAL_TABLET | Freq: Every evening | ORAL | 3 refills | Status: AC | PRN

## 2020-11-22 MED ORDER — FLUOXETINE HCL 20 MG PO TABS: 20.0000 mg | ORAL_TABLET | Freq: Every day | ORAL | 3 refills | Status: AC

## 2020-11-22 MED ORDER — CLINDAMYCIN HCL 300 MG PO CAPS: 300.0000 mg | ORAL_CAPSULE | Freq: Three times a day (TID) | ORAL | 0 refills | Status: AC

## 2020-11-22 NOTE — Assessment & Plan Note (Signed)
Start fluoxetine 20 mg daily, take benadryl at night for sleep re eval in 8 to 10 weeks

## 2020-11-22 NOTE — Assessment & Plan Note (Signed)
5 daY h/o right lower jaw pain and tenderness, difficulty chewing, swollen gland on right ant neck, cleocin prescribed and advised dental appt

## 2020-11-22 NOTE — Patient Instructions (Addendum)
F/U in 8 to 10 weeks with MD , re evaluate depression  New is fluoxetine 20 mg daily, take benadryl 25 mg at bedtime  Cleocin is prescribed for lower gum pain and you need dental eval  Please schedule mammogram at checkout  I will refer you to GI at [facility] re colonoscopy  Thanks for choosing [facility], we consider it a privelige to serve you.

## 2020-11-25 ENCOUNTER — Encounter: Payer: Self-pay | Admitting: Family Medicine

## 2020-11-25 ENCOUNTER — Other Ambulatory Visit: Payer: Self-pay | Admitting: Family Medicine

## 2020-11-25 DIAGNOSIS — I749 Embolism and thrombosis of unspecified artery: Secondary | ICD-10-CM | POA: Insufficient documentation

## 2020-11-25 NOTE — Assessment & Plan Note (Signed)
Currently stable , continue close f/u with cardiology

## 2020-11-25 NOTE — Progress Notes (Signed)
   AMARRI MICHAELSON     MRN: [id number]      DOB: 1960/09/19   HPI Ms. Buller is here for follow up and re-evaluation of chronic medical conditions, medication management and review of any available recent lab and radiology data.  Preventive health is updated, specifically  Cancer screening and Immunization.   Questions or concerns regarding consultations or procedures which the PT has had in the interim are  addressed. The PT denies any adverse reactions to current medications since the last visit.  There are no new concerns.  5 day h/o right lower jaw pain and tenderness, has recently had dental problems in th same area C/o depression, not suicidal or homicidal, wants treatment, no interest in therapy currently Colonoscopy is past due and states was deferred, had polyps  ROS Denies recent fever or chills. . Denies chest congestion, productive cough or wheezing. Denies chest pains, palpitations and leg swelling Denies abdominal pain, nausea, vomiting,diarrhea or constipation.   Denies dysuria, frequency, hesitancy or incontinence. Denies joint pain, swelling and limitation in mobility. Denies headaches, seizures, numbness, or tingling.  Denies skin break down or rash.   PE  BP (!) 92/59 (BP Location: Left Arm, Patient Position: Sitting, Cuff Size: Normal)   Pulse 90   Temp (!) 97.5 F (36.4 C) (Temporal)   Ht 5' (1.524 m)   Wt 180 lb (81.6 kg)   LMP 10/31/2002   SpO2 95%   BMI 35.15 kg/m   Patient alert and oriented and in no cardiopulmonary distress.  HEENT: No facial asymmetry, EOMI,     Neck supple .right mandibular  Tenderness and right anterior cervical adenopathy  Chest: Clear to auscultation bilaterally.  CVS: S1, S2 no murmurs, no S3.Regular rate.  ABD: Soft non tender.   Ext: No edema  MS: Adequate ROM spine, shoulders, hips and knees.  Skin: Intact, no ulcerations or rash noted.  Psych: Good eye contact, mildly depressed / flat  affect. Memory  intact not anxious  But mildly depressed appearing.  CNS: CN 2-12 intact, power,  normal throughout.no focal deficits noted.   Assessment & Plan  Gingivitis 5 daY h/o right lower jaw pain and tenderness, difficulty chewing, swollen gland on right ant neck, cleocin prescribed and advised dental appt  Depression, major, single episode, severe (HCC) Start fluoxetine 20 mg daily, take benadryl at night for sleep re eval in 8 to 10 weeks  Tubular adenoma of colon Colonoscopy past due , now pt has severe cardiopulmonary disease and is treated by [facility], will refer to [facility] for colonoscopy  Hypothyroidism Controlled and managed by Endo  Chronic diastolic heart failure ([place]) Currently stable , continue close f/u with cardiology  GERD (gastroesophageal reflux disease) uncontrolled symptoms , has upcoming upper endo at [facility], will refer for colonoscopy at same time if possible

## 2020-11-25 NOTE — Progress Notes (Unsigned)
Needs referral for colonoscopy 

## 2020-11-25 NOTE — Assessment & Plan Note (Signed)
Colonoscopy past due , now pt has severe cardiopulmonary disease and is treated by [facility], will refer to [facility] for colonoscopy

## 2020-11-25 NOTE — Assessment & Plan Note (Signed)
Controlled and managed by Endo 

## 2020-11-25 NOTE — Assessment & Plan Note (Signed)
uncontrolled symptoms , has upcoming upper endo at [facility], will refer for colonoscopy at same time if possible

## 2021-01-20 ENCOUNTER — Emergency Department (HOSPITAL_COMMUNITY): Payer: 59

## 2021-01-20 ENCOUNTER — Encounter (HOSPITAL_COMMUNITY): Payer: Self-pay | Admitting: Emergency Medicine

## 2021-01-20 ENCOUNTER — Other Ambulatory Visit: Payer: Self-pay | Admitting: Nurse Practitioner

## 2021-01-20 ENCOUNTER — Emergency Department (HOSPITAL_COMMUNITY)
Admission: EM | Admit: 2021-01-20 | Discharge: 2021-02-10 | Disposition: E | Payer: 59 | Attending: Emergency Medicine | Admitting: Emergency Medicine

## 2021-01-20 ENCOUNTER — Other Ambulatory Visit: Payer: Self-pay

## 2021-01-20 DIAGNOSIS — R1110 Vomiting, unspecified: Secondary | ICD-10-CM | POA: Diagnosis not present

## 2021-01-20 DIAGNOSIS — N183 Chronic kidney disease, stage 3 unspecified: Secondary | ICD-10-CM | POA: Diagnosis not present

## 2021-01-20 DIAGNOSIS — J45909 Unspecified asthma, uncomplicated: Secondary | ICD-10-CM | POA: Insufficient documentation

## 2021-01-20 DIAGNOSIS — I130 Hypertensive heart and chronic kidney disease with heart failure and stage 1 through stage 4 chronic kidney disease, or unspecified chronic kidney disease: Secondary | ICD-10-CM | POA: Diagnosis not present

## 2021-01-20 DIAGNOSIS — I469 Cardiac arrest, cause unspecified: Secondary | ICD-10-CM

## 2021-01-20 DIAGNOSIS — R059 Cough, unspecified: Secondary | ICD-10-CM | POA: Diagnosis not present

## 2021-01-20 DIAGNOSIS — I5033 Acute on chronic diastolic (congestive) heart failure: Secondary | ICD-10-CM | POA: Insufficient documentation

## 2021-01-20 DIAGNOSIS — Z91010 Allergy to peanuts: Secondary | ICD-10-CM | POA: Diagnosis not present

## 2021-01-20 DIAGNOSIS — R0602 Shortness of breath: Secondary | ICD-10-CM | POA: Diagnosis present

## 2021-01-20 DIAGNOSIS — E039 Hypothyroidism, unspecified: Secondary | ICD-10-CM | POA: Insufficient documentation

## 2021-01-20 DIAGNOSIS — R600 Localized edema: Secondary | ICD-10-CM | POA: Insufficient documentation

## 2021-01-20 DIAGNOSIS — Z91040 Latex allergy status: Secondary | ICD-10-CM | POA: Diagnosis not present

## 2021-01-20 DIAGNOSIS — Z79899 Other long term (current) drug therapy: Secondary | ICD-10-CM | POA: Diagnosis not present

## 2021-01-20 LAB — CBC WITH DIFFERENTIAL/PLATELET
Abs Immature Granulocytes: 0.13 10*3/uL — ABNORMAL HIGH (ref 0.00–0.07)
Basophils Absolute: 0 10*3/uL (ref 0.0–0.1)
Basophils Relative: 0 %
Eosinophils Absolute: 0 10*3/uL (ref 0.0–0.5)
Eosinophils Relative: 0 %
HCT: 27.8 % — ABNORMAL LOW (ref 36.0–46.0)
Hemoglobin: 7.7 g/dL — ABNORMAL LOW (ref 12.0–15.0)
Immature Granulocytes: 2 %
Lymphocytes Relative: 28 %
Lymphs Abs: 2 10*3/uL (ref 0.7–4.0)
MCH: 22.6 pg — ABNORMAL LOW (ref 26.0–34.0)
MCHC: 27.7 g/dL — ABNORMAL LOW (ref 30.0–36.0)
MCV: 81.5 fL (ref 80.0–100.0)
Monocytes Absolute: 0.7 10*3/uL (ref 0.1–1.0)
Monocytes Relative: 9 %
Neutro Abs: 4.4 10*3/uL (ref 1.7–7.7)
Neutrophils Relative %: 61 %
Platelets: 166 10*3/uL (ref 150–400)
RBC: 3.41 MIL/uL — ABNORMAL LOW (ref 3.87–5.11)
RDW: 19.2 % — ABNORMAL HIGH (ref 11.5–15.5)
WBC: 7.2 10*3/uL (ref 4.0–10.5)
nRBC: 0.7 % — ABNORMAL HIGH (ref 0.0–0.2)

## 2021-01-20 LAB — COMPREHENSIVE METABOLIC PANEL
ALT: 28 U/L (ref 0–44)
AST: 78 U/L — ABNORMAL HIGH (ref 15–41)
Albumin: 3.8 g/dL (ref 3.5–5.0)
Alkaline Phosphatase: 85 U/L (ref 38–126)
Anion gap: 21 — ABNORMAL HIGH (ref 5–15)
BUN: 29 mg/dL — ABNORMAL HIGH (ref 6–20)
CO2: 15 mmol/L — ABNORMAL LOW (ref 22–32)
Calcium: 7.1 mg/dL — ABNORMAL LOW (ref 8.9–10.3)
Chloride: 102 mmol/L (ref 98–111)
Creatinine, Ser: 1.91 mg/dL — ABNORMAL HIGH (ref 0.44–1.00)
GFR, Estimated: 30 mL/min — ABNORMAL LOW (ref >60)
Glucose, Bld: 62 mg/dL — ABNORMAL LOW (ref 70–99)
Potassium: 4.7 mmol/L (ref 3.5–5.1)
Sodium: 138 mmol/L (ref 135–145)
Total Bilirubin: 0.6 mg/dL (ref 0.3–1.2)
Total Protein: 8.1 g/dL (ref 6.5–8.1)

## 2021-01-20 LAB — CBG MONITORING, ED
Glucose-Capillary: 174 mg/dL — ABNORMAL HIGH (ref 70–99)
Glucose-Capillary: 39 mg/dL — CL (ref 70–99)

## 2021-01-20 LAB — PROTIME-INR
INR: 1.7 — ABNORMAL HIGH (ref 0.8–1.2)
Prothrombin Time: 20.4 seconds — ABNORMAL HIGH (ref 11.4–15.2)

## 2021-01-20 LAB — LACTIC ACID, PLASMA: Lactic Acid, Venous: 10.5 mmol/L (ref 0.5–1.9)

## 2021-01-20 MED ORDER — NOREPINEPHRINE 4 MG/250ML-% IV SOLN
INTRAVENOUS | Status: AC
  Filled 2021-01-20: qty 250

## 2021-01-20 MED ORDER — EPINEPHRINE 1 MG/10ML IJ SOSY
PREFILLED_SYRINGE | INTRAMUSCULAR | Status: AC | PRN
  Administered 2021-01-20: 1 mg via INTRAVENOUS

## 2021-01-20 MED ORDER — ASPIRIN 81 MG PO CHEW: 324.0000 mg | CHEWABLE_TABLET | Freq: Once | ORAL | Status: DC

## 2021-01-20 MED ORDER — CALCIUM CHLORIDE 10 % IV SOLN
INTRAVENOUS | Status: AC | PRN
  Administered 2021-01-20 (×2): 1 g via INTRAVENOUS

## 2021-01-20 MED ORDER — EPINEPHRINE 1 MG/10ML IJ SOSY
PREFILLED_SYRINGE | INTRAMUSCULAR | Status: AC | PRN
  Administered 2021-01-20 (×4): 1 mg via INTRAVENOUS

## 2021-01-20 MED ORDER — FUROSEMIDE 10 MG/ML IJ SOLN
60.0000 mg | Freq: Once | INTRAMUSCULAR | Status: AC
  Administered 2021-01-20: 60 mg via INTRAVENOUS
  Filled 2021-01-20: qty 6

## 2021-01-20 MED ORDER — SODIUM BICARBONATE 8.4 % IV SOLN
INTRAVENOUS | Status: AC | PRN
  Administered 2021-01-20 (×2): 50 meq via INTRAVENOUS

## 2021-01-20 MED FILL — Medication: Qty: 1 | Status: AC

## 2021-01-23 ENCOUNTER — Other Ambulatory Visit: Payer: Self-pay | Admitting: Family Medicine

## 2021-01-24 ENCOUNTER — Ambulatory Visit: Payer: 59 | Admitting: Family Medicine

## 2021-02-03 ENCOUNTER — Other Ambulatory Visit: Payer: Self-pay | Admitting: "Endocrinology

## 2021-02-10 NOTE — ED Notes (Signed)
Pt gold colored earrings placed in denture box with label- pt infusion medication pump to port in right chest disconnected. These items placed in body bag with pt- wet gauze applied over pt closed eyelids.

## 2021-02-10 NOTE — ED Notes (Signed)
Alize, RN was in room starting [place] IV on pt - when pt went unresponsive, diaphoretic, not breathing, no pulse. This nurse and Dr Wyvonnia Dusky to room- CPR started, IO established to left tib by Vivien Rota, Therapist, sports

## 2021-02-10 NOTE — ED Notes (Signed)
Pt family at bedside- support given- comfort measures provided.

## 2021-02-10 NOTE — ED Triage Notes (Signed)
Pt with c/o SOB x 2 days. States she thought she had a cold but that her breathing had gotten worse. Pt with dyspnea at rest. Pt has implanted port in R chest with continuous medication (Veletra) infusing.

## 2021-02-10 NOTE — ED Notes (Signed)
Cardiology paged to Dr Rancour @ [phone]

## 2021-02-10 NOTE — ED Notes (Signed)
Pt taken to morgue with nurse and security

## 2021-02-10 NOTE — ED Provider Notes (Addendum)
[facility] EMERGENCY DEPARTMENT Provider Note   CSN: [id number] Arrival date & time: 2021-01-23  0156     History Chief Complaint  Patient presents with   Shortness of Sabrina [name] is a 60 y.o. female.  Patient presents with respiratory distress and shortness of breath..  She has a history of diastolic heart failure, DM, pulmonary hypertension on continuous Veletra infusion, scleroderma, interstitial lung disease, hypokalemia, hypocalcemia and prolonged QT.  Presents with 2 day history of shortness of breath. She states she feels like her potassium and calcium are low again.  She has missed several days of her diuretic at home.  She is supposed to take 80 mg of Lasix today but is only been taking 40 mg a day.  She has increased shortness of breath worsening over the past 2 days.  Cough with clear mucus.  No fever.  No chest pain. Some Increased leg swelling.  Still has PND and orthopnea at baseline. Had several episodes of posttussive emesis today.  Has recently been on antibiotics for tooth infection for the past week.  Has had intermittent diarrhea since then. Uncertain of any change in her weight.  Unable to lie flat at baseline. Comes in today with worsening shortness of breath and feeling like her potassium was low.  The history is provided by the patient. The history is limited by the condition of the patient.  Shortness of Breath Associated symptoms: cough and vomiting   Associated symptoms: no fever and no headaches       Past Medical History:  Diagnosis Date   Acute on chronic congestive heart failure (HCC)    Acute on chronic diastolic (congestive) heart failure ([place]) 08/17/2019   Arthritis    Asthma    Chronic bronchitis ([place])    Encounter for long-term (current) use of other medications 01/14/2015   Endometriosis    Essential hypertension    Fibroid uterus    GERD (gastroesophageal reflux disease)    History of hiatal hernia    History of nuclear  stress test    a. Myoview 6/16: There is no resting or stress perfusion defect consistent with no prior infarct and no ischemia. EF 73 %  The patient was hypertensive prior and during the study. Cardiac cath 04/2016 showed normal coronary arteries   Hx of iron deficiency anemia    Hypersomnia with sleep apnea    Hypocalcemia 07/07/2016   Hypomagnesemia 06/05/2019   Hypotension    Interstitial lung disease ([place])    Iron deficiency anemia 09/11/2014   Overview:  Last Assessment & Plan:  Had colonoscopy in 07/2013. 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   Scleroderma ([place])    Seasonal allergies 01/05/2015   Overview:  Last Assessment & Plan:  Needs to commit to daily use of appropriate medication, astelin and nasonex prescribed and singular also   Secondary hyperthyroidism    Severe sepsis ([place]) 08/17/2019   SIRS (systemic inflammatory response syndrome) ([place])  Tinnitus    Left   Tubular adenoma of colon    Type 2 diabetes mellitus ([place])    Upper airway cough syndrome 02/04/2018   Vertigo     Patient Active Problem List   Diagnosis Date Noted   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[place]) 25/63/8937   Gingivitis 11/22/2020   Depression, major, single episode, severe ([place]) 11/22/2020   Screening mammogram, encounter for 03/02/2020   Right heart failure with reduced right ventricular function ([place]) 08/22/2019   Hypoglycemia    Anemia of chronic disease    Long Q-T syndrome    CKD (chronic kidney disease) stage 3, GFR 30-59 ml/min ([place]) 08/01/2019   PAH (pulmonary artery hypertension) ([place])  12/03/2018   Hypoxia 08/19/2018   Multinodular goiter 05/14/2018   ILD (interstitial lung disease) ([place]) 11/21/2017   Insomnia 05/20/2017   Morbid obesity ([place]) 05/20/2017   Hypocalcemia 07/07/2016   Hypokalemia 07/07/2016   Prolonged QT syndrome 06/22/2016   Connective tissue disease, 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   Pulmonary hypertension ([place]) 04/06/2016   Chronic diastolic heart failure ([place]) 04/06/2016   Pulmonary artery hypertension associated with connective tissue disease ([place]) 04/06/2016   Drug eruption 03/14/2016   Psoriasis 10/18/2015   GERD (gastroesophageal reflux disease) 07/05/2015   Scleroderma ([place]) 03/09/2015   Tubular adenoma of colon 09/11/2014   Neoplasm of uncertain behavior of skin 05/09/2012   Chronic fatigue 10/26/2010   Secondary hyperparathyroidism ([place]) 12/02/2008   Vitamin D deficiency 10/10/2008   Hypothyroidism 08/27/2007    Past Surgical History:  Procedure Laterality Date   ABDOMINAL HYSTERECTOMY  2004   supracervical hysterectomy   BREAST SURGERY  2005   reduction   BREATH TEK H PYLORI [name] 12/24/2014   Procedure: BREATH TEK H PYLORI;  Surgeon: Greer Pickerel, MD;  Location: [facility];  Service: General;  Laterality: [name];   CARDIAC CATHETERIZATION [name] 03/22/2016   Procedure: Right/Left Heart Cath and Coronary Angiography;  Surgeon: Troy Sine, MD;  Location: [facility];  Service: Cardiovascular;  Laterality: [name];   DILATION AND CURETTAGE OF UTERUS     age 64   ESOPHAGOGASTRODUODENOSCOPY (EGD) WITH PROPOFOL [name] 10/14/2015   Procedure: ESOPHAGOGASTRODUODENOSCOPY (EGD) WITH PROPOFOL;  Surgeon: Clarene Essex, MD;  Location: [facility];  Service: Endoscopy;  Laterality: [name];   LAPAROSCOPIC GASTRIC SLEEVE RESECTION WITH HIATAL HERNIA REPAIR [name] 07/05/2015   Procedure: LAPAROSCOPIC GASTRIC SLEEVE RESECTION WITH HIATAL HERNIA REPAIR, upper endoscopy;  Surgeon: Greer Pickerel, MD;  Location: [facility];  Service: General;  Laterality: [name];    REDUCTION MAMMAPLASTY Bilateral    RIGHT HEART CATH [name] 10/13/2018   Procedure: RIGHT HEART CATH;  Surgeon: Larey Dresser, MD;  Location: [facility];  Service: Cardiovascular;  Laterality: [name];   RIGHT HEART CATH [name] 08/19/2019   Procedure: RIGHT HEART CATH;  Surgeon: Larey Dresser, MD;  Location: [facility];  Service: Cardiovascular;  Laterality: [name];     OB History     Gravida  0   Para  0   Term  0   Preterm  0   AB  0   Living  0      SAB  0   IAB  0   Ectopic  0   Multiple  0   Live Births              Family History  Problem Relation Age of Onset   Hypertension Mother    Diabetes Mother    Arthritis Mother  Depression Mother    CAD Mother        s/p PCI   Diabetes Father    Stroke Father    Arthritis Father    Depression Father    Hypertension Father    Cancer Father        prostate   Hypertension Sister        x 2   Breast cancer Sister    Arthritis Sister    Bleeding Disorder Sister    Depression Sister    Thyroid disease Sister    Colon cancer Maternal Grandfather    Stroke Maternal Aunt    Heart attack Neg Hx     Social History   Tobacco Use   Smoking status: Never   Smokeless tobacco: Never  Vaping Use   Vaping Use: Never used  Substance Use Topics   Alcohol use: No    Alcohol/week: 0.0 standard drinks   Drug use: No    Home Medications Prior to Admission medications   Medication Sig Start Date End Date Taking? Authorizing Provider  acetaminophen (TYLENOL) 325 MG tablet Take 2 tablets (650 mg total) by mouth every 6 (six) hours as needed for mild pain (or Fever >/= 101). 08/19/19   Clegg, Amy D, NP  calcium carbonate (OS-CAL - DOSED IN MG OF ELEMENTAL CALCIUM) 1250 (500 Ca) MG tablet Take 3 tablets (1,500 mg of elemental calcium total) by mouth 3 (three) times daily with meals. 06/05/19   Cassandria Anger, MD  clindamycin (CLEOCIN) 300 MG capsule Take 1 capsule (300 mg total) by mouth 3 (three) times  daily. 11/22/20   Fayrene Helper, MD  diphenhydrAMINE (BENADRYL ALLERGY) 25 MG tablet Take 1 tablet (25 mg total) by mouth at bedtime as needed. 11/22/20   Fayrene Helper, MD  epoprostenol 1.5 MG injection Inject into the vein.    [provider]  FLUoxetine (PROZAC) 20 MG tablet Take 1 tablet (20 mg total) by mouth daily. 11/22/20   Fayrene Helper, MD  levothyroxine (SYNTHROID) 88 MCG tablet Take 1 tablet (88 mcg total) by mouth daily before breakfast. 09/21/20   Nida, Marella Chimes, MD  Magnesium Oxide 250 MG TABS Take 1 tablet by mouth daily with breakfast.    [provider]  midodrine (PROAMATINE) 5 MG tablet Take 3 tablets (15 mg total) by mouth 3 (three) times daily with meals. 08/20/19   Larey Dresser, MD  Multiple Vitamin (MULTIVITAMIN WITH MINERALS) TABS tablet Take 1 tablet by mouth daily.    [provider]  mycophenolate (MYFORTIC) 360 MG TBEC EC tablet Take 720 mg by mouth 2 (two) times daily.  04/04/18   [provider]  pantoprazole (PROTONIX) 20 MG tablet 1 TABLET EACH MORNING, MAY TAKE ADDITIONAL DOSE IN THE EVENING IF NEEDED FOR HEARTBURN/INDIGESTION. 08/24/20   Cassandria Anger, MD  senna-docusate (SENOKOT-S) 8.6-50 MG tablet Take 2 tablets by mouth at bedtime. 04/23/19   Roxan Hockey, MD  Vitamin D, Ergocalciferol, (DRISDOL) 1.25 MG (50000 UNIT) CAPS capsule TAKE 1 CAPSULE BY MOUTH EVERY 2 WEEKS 11/15/20   Fayrene Helper, MD  potassium chloride (K-DUR) 10 MEQ tablet Take 2 tablets by mouth twice daily 12/01/18 04/01/19  Fayrene Helper, MD    Allergies    Peanut oil, Potassium iodide, Ace inhibitors, Doxycycline, Peanut-containing drug products, Chlorhexidine, Amlodipine, Codeine, Latex, and Penicillins  Review of Systems   Review of Systems  Constitutional:  Negative for activity change, appetite change and fever.  Respiratory:  Positive for cough and shortness of breath.   Gastrointestinal:  Positive for diarrhea  and vomiting.  Genitourinary:  Negative for dysuria and hematuria.  Musculoskeletal:  Negative for arthralgias and myalgias.  Neurological:  Negative for dizziness, weakness and headaches.   all other systems are negative except as noted in the HPI and PMH.   Physical Exam Updated Vital Signs BP (!) 108/21   Pulse (!) 120   Temp (!) 97.52 F (36.4 C)   Resp (!) 76   Ht 5' (1.524 m)   Wt 81.2 kg   LMP 10/31/2002   SpO2 95%   BMI 34.96 kg/m   Physical Exam Vitals and nursing note reviewed.  Constitutional:      General: She is in acute distress.     Appearance: She is well-developed. She is obese. She is ill-appearing.     Comments: Dyspneic with conversation  HENT:     Head: Normocephalic and atraumatic.     Mouth/Throat:     Pharynx: No oropharyngeal exudate.  Eyes:     Conjunctiva/sclera: Conjunctivae normal.     Pupils: Pupils are equal, round, and reactive to light.  Neck:     Comments: No meningismus. Cardiovascular:     Rate and Rhythm: Regular rhythm. Tachycardia present.     Heart sounds: Normal heart sounds. No murmur heard. Pulmonary:     Effort: Pulmonary effort is normal. No respiratory distress.     Breath sounds: Rales present.  Abdominal:     Palpations: Abdomen is soft.     Tenderness: There is no abdominal tenderness. There is no guarding or rebound.  Musculoskeletal:        General: No tenderness. Normal range of motion.     Cervical back: Normal range of motion and neck supple.     Right lower leg: Edema present.     Left lower leg: Edema present.  Skin:    General: Skin is warm.  Neurological:     Mental Status: She is alert and oriented to person, place, and time.     Cranial Nerves: No cranial nerve deficit.     Motor: No abnormal muscle tone.     Coordination: Coordination normal.     Comments:  5/5 strength throughout. CN 2-12 intact.Equal grip strength.   Psychiatric:        Behavior: Behavior normal.    ED Results / Procedures /  Treatments   Labs (all labs ordered are listed, but only abnormal results are displayed) Labs Reviewed  COMPREHENSIVE METABOLIC PANEL - Abnormal; Notable for the following components:      Result Value   CO2 15 (*)    Glucose, Bld 62 (*)    BUN 29 (*)    Creatinine, Ser 1.91 (*)    Calcium 7.1 (*)    AST 78 (*)    GFR, Estimated 30 (*)    Anion gap 21 (*)    All other components within normal limits  LACTIC ACID, PLASMA - Abnormal; Notable for the following components:   Lactic Acid, Venous 10.5 (*)    All other components within normal limits  CBC WITH DIFFERENTIAL/PLATELET - Abnormal; Notable for the following components:   RBC 3.41 (*)    Hemoglobin 7.7 (*)    HCT 27.8 (*)    MCH 22.6 (*)    MCHC 27.7 (*)    RDW 19.2 (*)    nRBC 0.7 (*)    Abs Immature Granulocytes 0.13 (*)    All  other components within normal limits  PROTIME-INR - Abnormal; Notable for the following components:   Prothrombin Time 20.4 (*)    INR 1.7 (*)    All other components within normal limits  CBG MONITORING, ED - Abnormal; Notable for the following components:   Glucose-Capillary 39 (*)    All other components within normal limits  CBG MONITORING, ED - Abnormal; Notable for the following components:   Glucose-Capillary 174 (*)    All other components within normal limits  CULTURE, BLOOD (ROUTINE X 2)  CULTURE, BLOOD (ROUTINE X 2)  LACTIC ACID, PLASMA  URINALYSIS, ROUTINE W REFLEX MICROSCOPIC    EKG EKG Interpretation  Date/Time:  10-Feb-2021 02:11:18 EDT Ventricular Rate:  110 PR Interval:  140 QRS Duration: 122 QT Interval:  349 QTC Calculation: 473 R Axis:   110 Text Interpretation: Sinus tachycardia RBBB and LPFB Baseline wander in lead(s) V2 Rate faster Confirmed by Ezequiel Essex [phone]) on 02/10/2021 2:31:48 AM  Radiology No results found.  Procedures intubation Date/Time: Feb 10, 2021 3:54 AM Performed by: Ezequiel Essex, MD Pre-anesthesia Checklist: Patient  identified, Suction available and Patient being monitored Oxygen Delivery Method: Ambu bag Preoxygenation: Pre-oxygenation with 100% oxygen Induction Type: Rapid sequence Ventilation: Mask ventilation with difficulty Laryngoscope Size: Glidescope and 4 Grade View: Grade I Tube size: 7.5 mm Number of attempts: 1 Airway Equipment and Method: Video-laryngoscopy and Stylet Placement Confirmation: ETT inserted through vocal cords under direct vision, Positive ETCO2, CO2 detector and Breath sounds checked- equal and bilateral Secured at: 24 cm Tube secured with: ETT holder Dental Injury: Teeth and Oropharynx as per pre-operative assessment  Difficulty Due To: Difficulty was anticipated, Difficult Airway- due to large tongue and Difficult Airway- due to reduced neck mobility Future Recommendations: Recommend- induction with short-acting agent, and alternative techniques readily available Comments: Performed during CPR with no medications.    CPR  Date/Time: 02-10-2021 3:54 AM Performed by: Ezequiel Essex, MD Authorized by: Ezequiel Essex, MD  CPR Procedure Details:      Amount of time prior to administration of ACLS/BLS (minutes):  0   ACLS/BLS initiated by EMS: No     CPR/ACLS performed in the ED: Yes     Duration of CPR (minutes):  45   Outcome: Pt declared dead    CPR performed via ACLS guidelines under my direct supervision.  See RN documentation for details including defibrillator use, medications, doses and timing. .Critical Care  Date/Time: 02/10/2021 4:26 AM Performed by: Ezequiel Essex, MD Authorized by: Ezequiel Essex, MD   Critical care provider statement:    Critical care time (minutes):  75   Critical care was necessary to treat or prevent imminent or life-threatening deterioration of the following conditions: cardiac arrest.   Critical care was time spent personally by me on the following activities:  Discussions with consultants, evaluation of patient's response  to treatment, examination of patient, ordering and performing treatments and interventions, ordering and review of laboratory studies, ordering and review of radiographic studies, pulse oximetry, re-evaluation of patient's condition, obtaining history from patient or surrogate and review of old charts .Central Line  Date/Time: 02/10/21 5:18 AM Performed by: Ezequiel Essex, MD Authorized by: Ezequiel Essex, MD   Consent:    Consent obtained:  Emergent situation   Risks discussed:  Arterial puncture, bleeding, incorrect placement, infection and nerve damage Universal protocol:    Patient identity confirmed:  Hospital-assigned identification number Pre-procedure details:    Indication(s): central venous access and insufficient peripheral access  Hand hygiene: Hand hygiene performed prior to insertion     Sterile barrier technique: All elements of maximal sterile technique followed     Skin preparation:  Chlorhexidine   Skin preparation agent: Skin preparation agent completely dried prior to procedure   Sedation:    Sedation type:  None Anesthesia:    Anesthesia method:  None Procedure details:    Location:  R femoral   Patient position:  Supine   Procedural supplies:  Triple lumen   Catheter size:  7 Fr   Ultrasound guidance: yes     Ultrasound guidance timing: prior to insertion     Number of attempts:  2   Successful placement: no   Post-procedure details:    Procedure completion:  Procedure terminated due to patient's clinical status Comments:     Attempted femoral CVC placement during CPR.  Flash of dark blood was obtained, guidewire passed successfully.  Blood then Appeared bright red from likely arterial puncture and attempt was stopped. Family at bedside agreed with no further resuscitative efforts.   Medications Ordered in ED Medications  aspirin chewable tablet 324 mg (has no administration in time range)  norepinephrine (LEVOPHED) 4-5 MG/250ML-% infusion SOLN  (has no administration in time range)  furosemide (LASIX) injection 60 mg (60 mg Intravenous Given 02-10-2021 0311)  EPINEPHrine (ADRENALIN) 1 MG/10ML injection (1 mg Intravenous Given Feb 10, 2021 0241)  EPINEPHrine (ADRENALIN) 1 MG/10ML injection (1 mg Intravenous Given 02-10-2021 0243)  sodium bicarbonate injection (50 mEq Intravenous Given 2021/02/10 0301)  calcium chloride injection (1 g Intravenous Given 2021-02-10 0300)  EPINEPHrine (ADRENALIN) 1 MG/10ML injection (1 mg Intravenous Given 02/10/2021 0307)  EPINEPHrine (ADRENALIN) 1 MG/10ML injection (1 mg Intravenous Given Feb 10, 2021 0305)    ED Course  I have reviewed the triage vital signs and the nursing notes.  Pertinent labs & imaging results that were available during my care of the patient were reviewed by me and considered in my medical decision making (see chart for details).    MDM Rules/Calculators/A&P                         Patient with complex past medical history here with shortness of breath for the past 2 days.  She is tachypneic and tachycardic but not hypoxic.  Denies chest pain.  Initial EKG shows sinus tachycardia with right bundle branch block.  QT 473.  Appears volume overloaded.  Will obtain labs, chest x-ray, give IV Lasix. PE considered as well.  Shortly after initial evaluation patient became unresponsive during IV start.  She was found to be agonal with PEA and no pulse.  CPR was initiated immediately  Immediate initiation of CPR.  PEA on the monitor.  Patient given IV epinephrine, D50, calcium, bicarb per ACLS protocol. No shockable rhythms.   During arrest, Was found to be hypoglycemic which was replaced. Potassium was normal. Hemoglobin 7.7. normally in 9-10 range.  CPR continued for greater than 45 minutes with intermittent return of pulses briefly.  Patient with return of spontaneous circulation several times to slow PEA but bradycardic again with loss of pulses.no sustained perfusing rhythm .  Patient intubated by  myself.  Right femoral CVC was attempted without success. Did have brief return of pulses several times but that does not last and patient again became bradycardic with PEA.  Discussed with husband that she is unlikely to survive this cardiac arrest. Husband to bedside to witness CPR in progress.  On final pulse check, no  cardiac activity seen on bedside ultrasound.  Asystole on monitor. Husband agrees with no further resuscitative efforts.  Death declared at 3:14 AM  D/w medical examiner Bobby Rumpf who agrees medical death and not ME case.  Husband and family updated.  D/w Donneta Romberg NP on call for Dr Moshe Cipro who accepts death certificate.  Final diagnoses:  Cardiac arrest [facility])    Rx / [place] Orders ED Discharge Orders     None        Luann Aspinwall, Annie Main, MD 01-22-2021 [id number]    Ezequiel Essex, MD 01/22/2021 [phone]

## 2021-02-10 DEATH — deceased

## 2021-02-15 ENCOUNTER — Ambulatory Visit: Payer: 59 | Admitting: Family Medicine

## 2021-03-21 ENCOUNTER — Ambulatory Visit: Payer: 59 | Admitting: "Endocrinology

## 2021-03-24 ENCOUNTER — Other Ambulatory Visit: Payer: Self-pay | Admitting: "Endocrinology
